# Patient Record
Sex: Male | Born: 1960 | Race: White | Hispanic: No | Smoking: Former smoker
Health system: Southern US, Community
[De-identification: ages and names within clinical notes are randomized; demographics above are authoritative.]

## PROBLEM LIST (undated history)

## (undated) DIAGNOSIS — I499 Cardiac arrhythmia, unspecified: Secondary | ICD-10-CM

## (undated) DIAGNOSIS — I459 Conduction disorder, unspecified: Secondary | ICD-10-CM

## (undated) DIAGNOSIS — E119 Type 2 diabetes mellitus without complications: Secondary | ICD-10-CM

## (undated) DIAGNOSIS — N189 Chronic kidney disease, unspecified: Secondary | ICD-10-CM

## (undated) DIAGNOSIS — I330 Acute and subacute infective endocarditis: Secondary | ICD-10-CM

## (undated) DIAGNOSIS — M1990 Unspecified osteoarthritis, unspecified site: Secondary | ICD-10-CM

## (undated) DIAGNOSIS — R918 Other nonspecific abnormal finding of lung field: Secondary | ICD-10-CM

## (undated) DIAGNOSIS — J45909 Unspecified asthma, uncomplicated: Secondary | ICD-10-CM

## (undated) DIAGNOSIS — M109 Gout, unspecified: Secondary | ICD-10-CM

## (undated) HISTORY — PX: JOINT REPLACEMENT: SHX530

## (undated) HISTORY — DX: Chronic kidney disease, unspecified: N18.9

## (undated) HISTORY — DX: Type 2 diabetes mellitus without complications: E11.9

## (undated) HISTORY — DX: Other nonspecific abnormal finding of lung field: R91.8

## (undated) HISTORY — DX: Acute and subacute infective endocarditis: I33.0

## (undated) HISTORY — PX: ACHILLES TENDON REPAIR: SUR1153

## (undated) HISTORY — PX: HERNIA REPAIR: SHX51

## (undated) HISTORY — PX: FOOT SURGERY: SHX648

## (undated) HISTORY — PX: CARDIAC VALVE REPLACEMENT: SHX585

---

## 2021-06-12 DIAGNOSIS — B9561 Methicillin susceptible Staphylococcus aureus infection as the cause of diseases classified elsewhere: Secondary | ICD-10-CM

## 2021-06-12 DIAGNOSIS — R7881 Bacteremia: Secondary | ICD-10-CM

## 2021-06-12 HISTORY — DX: Methicillin susceptible Staphylococcus aureus infection as the cause of diseases classified elsewhere: B95.61

## 2021-06-12 HISTORY — DX: Bacteremia: R78.81

## 2021-06-25 DIAGNOSIS — Z23 Encounter for immunization: Secondary | ICD-10-CM

## 2021-07-03 ENCOUNTER — Other Ambulatory Visit: Payer: Self-pay

## 2021-07-03 ENCOUNTER — Emergency Department: Payer: Self-pay

## 2021-07-03 ENCOUNTER — Inpatient Hospital Stay: Payer: Self-pay

## 2021-07-03 ENCOUNTER — Inpatient Hospital Stay
Admission: EM | Admit: 2021-07-03 | Discharge: 2021-07-08 | DRG: 871 | Disposition: A | Discharge status: Other Acute Inpatient Hospital | Payer: Self-pay | Attending: Internal Medicine | Admitting: Internal Medicine

## 2021-07-03 ENCOUNTER — Encounter: Payer: Self-pay | Admitting: Emergency Medicine

## 2021-07-03 DIAGNOSIS — D649 Anemia, unspecified: Secondary | ICD-10-CM | POA: Diagnosis present

## 2021-07-03 DIAGNOSIS — M2550 Pain in unspecified joint: Secondary | ICD-10-CM | POA: Diagnosis present

## 2021-07-03 DIAGNOSIS — Z20822 Contact with and (suspected) exposure to covid-19: Secondary | ICD-10-CM | POA: Diagnosis present

## 2021-07-03 DIAGNOSIS — R531 Weakness: Secondary | ICD-10-CM

## 2021-07-03 DIAGNOSIS — I2690 Septic pulmonary embolism without acute cor pulmonale: Secondary | ICD-10-CM | POA: Diagnosis present

## 2021-07-03 DIAGNOSIS — E1165 Type 2 diabetes mellitus with hyperglycemia: Secondary | ICD-10-CM | POA: Diagnosis present

## 2021-07-03 DIAGNOSIS — R6510 Systemic inflammatory response syndrome (SIRS) of non-infectious origin without acute organ dysfunction: Secondary | ICD-10-CM

## 2021-07-03 DIAGNOSIS — Z87891 Personal history of nicotine dependence: Secondary | ICD-10-CM

## 2021-07-03 DIAGNOSIS — M109 Gout, unspecified: Secondary | ICD-10-CM | POA: Diagnosis present

## 2021-07-03 DIAGNOSIS — Z6835 Body mass index (BMI) 35.0-35.9, adult: Secondary | ICD-10-CM

## 2021-07-03 DIAGNOSIS — E0800 Diabetes mellitus due to underlying condition with hyperosmolarity without nonketotic hyperglycemic-hyperosmolar coma (NKHHC): Secondary | ICD-10-CM

## 2021-07-03 DIAGNOSIS — R112 Nausea with vomiting, unspecified: Secondary | ICD-10-CM | POA: Diagnosis present

## 2021-07-03 DIAGNOSIS — I330 Acute and subacute infective endocarditis: Secondary | ICD-10-CM

## 2021-07-03 DIAGNOSIS — E860 Dehydration: Secondary | ICD-10-CM | POA: Diagnosis present

## 2021-07-03 DIAGNOSIS — R0602 Shortness of breath: Secondary | ICD-10-CM

## 2021-07-03 DIAGNOSIS — M609 Myositis, unspecified: Secondary | ICD-10-CM | POA: Diagnosis present

## 2021-07-03 DIAGNOSIS — R739 Hyperglycemia, unspecified: Secondary | ICD-10-CM

## 2021-07-03 DIAGNOSIS — T380X5A Adverse effect of glucocorticoids and synthetic analogues, initial encounter: Secondary | ICD-10-CM | POA: Diagnosis present

## 2021-07-03 DIAGNOSIS — G9341 Metabolic encephalopathy: Secondary | ICD-10-CM

## 2021-07-03 DIAGNOSIS — D6959 Other secondary thrombocytopenia: Secondary | ICD-10-CM | POA: Diagnosis present

## 2021-07-03 DIAGNOSIS — R7881 Bacteremia: Secondary | ICD-10-CM

## 2021-07-03 DIAGNOSIS — M25452 Effusion, left hip: Secondary | ICD-10-CM | POA: Diagnosis present

## 2021-07-03 DIAGNOSIS — I442 Atrioventricular block, complete: Secondary | ICD-10-CM | POA: Diagnosis present

## 2021-07-03 DIAGNOSIS — E669 Obesity, unspecified: Secondary | ICD-10-CM | POA: Diagnosis present

## 2021-07-03 DIAGNOSIS — Z96643 Presence of artificial hip joint, bilateral: Secondary | ICD-10-CM | POA: Diagnosis present

## 2021-07-03 DIAGNOSIS — F1010 Alcohol abuse, uncomplicated: Secondary | ICD-10-CM | POA: Diagnosis present

## 2021-07-03 DIAGNOSIS — Z794 Long term (current) use of insulin: Secondary | ICD-10-CM

## 2021-07-03 DIAGNOSIS — D696 Thrombocytopenia, unspecified: Secondary | ICD-10-CM

## 2021-07-03 DIAGNOSIS — E876 Hypokalemia: Secondary | ICD-10-CM

## 2021-07-03 DIAGNOSIS — M25451 Effusion, right hip: Secondary | ICD-10-CM | POA: Diagnosis present

## 2021-07-03 DIAGNOSIS — I459 Conduction disorder, unspecified: Secondary | ICD-10-CM

## 2021-07-03 DIAGNOSIS — E871 Hypo-osmolality and hyponatremia: Secondary | ICD-10-CM | POA: Diagnosis present

## 2021-07-03 DIAGNOSIS — J9601 Acute respiratory failure with hypoxia: Secondary | ICD-10-CM | POA: Diagnosis not present

## 2021-07-03 DIAGNOSIS — A4101 Sepsis due to Methicillin susceptible Staphylococcus aureus: Principal | ICD-10-CM | POA: Diagnosis present

## 2021-07-03 DIAGNOSIS — R197 Diarrhea, unspecified: Secondary | ICD-10-CM | POA: Diagnosis present

## 2021-07-03 HISTORY — DX: Unspecified osteoarthritis, unspecified site: M19.90

## 2021-07-03 HISTORY — DX: Gout, unspecified: M10.9

## 2021-07-03 LAB — CBG MONITORING, ED
Glucose-Capillary: 368 mg/dL — ABNORMAL HIGH (ref 70–99)
Glucose-Capillary: 283 mg/dL — ABNORMAL HIGH (ref 70–99)
Glucose-Capillary: 264 mg/dL — ABNORMAL HIGH (ref 70–99)

## 2021-07-03 LAB — BASIC METABOLIC PANEL
Anion gap: 13 (ref 5–15)
Anion gap: 12 (ref 5–15)
Anion gap: 11 (ref 5–15)
BUN: 29 mg/dL — ABNORMAL HIGH (ref 6–20)
BUN: 31 mg/dL — ABNORMAL HIGH (ref 6–20)
BUN: 32 mg/dL — ABNORMAL HIGH (ref 6–20)
CO2: 26 mmol/L (ref 22–32)
CO2: 27 mmol/L (ref 22–32)
CO2: 24 mmol/L (ref 22–32)
Calcium: 8.3 mg/dL — ABNORMAL LOW (ref 8.9–10.3)
Calcium: 8.1 mg/dL — ABNORMAL LOW (ref 8.9–10.3)
Calcium: 7.8 mg/dL — ABNORMAL LOW (ref 8.9–10.3)
Chloride: 83 mmol/L — ABNORMAL LOW (ref 98–111)
Chloride: 85 mmol/L — ABNORMAL LOW (ref 98–111)
Chloride: 87 mmol/L — ABNORMAL LOW (ref 98–111)
Creatinine, Ser: 1.13 mg/dL (ref 0.61–1.24)
Creatinine, Ser: 1.19 mg/dL (ref 0.61–1.24)
Creatinine, Ser: 0.99 mg/dL (ref 0.61–1.24)
GFR, Estimated: >60 mL/min (ref >60)
GFR, Estimated: >60 mL/min (ref >60)
GFR, Estimated: >60 mL/min (ref >60)
Glucose, Bld: 337 mg/dL — ABNORMAL HIGH (ref 70–99)
Glucose, Bld: 267 mg/dL — ABNORMAL HIGH (ref 70–99)
Glucose, Bld: 312 mg/dL — ABNORMAL HIGH (ref 70–99)
Potassium: 3.4 mmol/L — ABNORMAL LOW (ref 3.5–5.1)
Potassium: 3.4 mmol/L — ABNORMAL LOW (ref 3.5–5.1)
Potassium: 4.1 mmol/L (ref 3.5–5.1)
Sodium: 122 mmol/L — ABNORMAL LOW (ref 135–145)
Sodium: 124 mmol/L — ABNORMAL LOW (ref 135–145)
Sodium: 122 mmol/L — ABNORMAL LOW (ref 135–145)

## 2021-07-03 LAB — CBC
HCT: 39.5 % (ref 39.0–52.0)
HCT: 41.9 % (ref 39.0–52.0)
Hemoglobin: 14.8 g/dL (ref 13.0–17.0)
Hemoglobin: 15.1 g/dL (ref 13.0–17.0)
MCH: 33.2 pg (ref 26.0–34.0)
MCH: 32.8 pg (ref 26.0–34.0)
MCHC: 37.5 g/dL — ABNORMAL HIGH (ref 30.0–36.0)
MCHC: 36 g/dL (ref 30.0–36.0)
MCV: 88.6 fL (ref 80.0–100.0)
MCV: 91.1 fL (ref 80.0–100.0)
Platelets: 68 10*3/uL — ABNORMAL LOW (ref 150–400)
Platelets: 67 10*3/uL — ABNORMAL LOW (ref 150–400)
RBC: 4.46 MIL/uL (ref 4.22–5.81)
RBC: 4.6 MIL/uL (ref 4.22–5.81)
RDW: 11.8 % (ref 11.5–15.5)
RDW: 11.9 % (ref 11.5–15.5)
WBC: 17.4 10*3/uL — ABNORMAL HIGH (ref 4.0–10.5)
WBC: 13.1 10*3/uL — ABNORMAL HIGH (ref 4.0–10.5)
nRBC: 0 % (ref 0.0–0.2)
nRBC: 0 % (ref 0.0–0.2)

## 2021-07-03 LAB — URINALYSIS, COMPLETE (UACMP) WITH MICROSCOPIC
Bilirubin Urine: NEGATIVE
Glucose, UA: >=500 mg/dL — AB
Ketones, ur: NEGATIVE mg/dL
Leukocytes,Ua: NEGATIVE
Nitrite: NEGATIVE
Protein, ur: NEGATIVE mg/dL
Specific Gravity, Urine: 1.012 (ref 1.005–1.030)
pH: 5 (ref 5.0–8.0)

## 2021-07-03 LAB — GLUCOSE, CAPILLARY
Glucose-Capillary: 255 mg/dL — ABNORMAL HIGH (ref 70–99)
Glucose-Capillary: 261 mg/dL — ABNORMAL HIGH (ref 70–99)

## 2021-07-03 LAB — OSMOLALITY, URINE: Osmolality, Ur: 428 mOsm/kg (ref 300–900)

## 2021-07-03 LAB — MAGNESIUM: Magnesium: 2.2 mg/dL (ref 1.7–2.4)

## 2021-07-03 LAB — DIFFERENTIAL
Abs Immature Granulocytes: 0.19 10*3/uL — ABNORMAL HIGH (ref 0.00–0.07)
Basophils Absolute: 0.1 10*3/uL (ref 0.0–0.1)
Basophils Relative: 1 %
Eosinophils Absolute: 0 10*3/uL (ref 0.0–0.5)
Eosinophils Relative: 0 %
Immature Granulocytes: 1 %
Lymphocytes Relative: 4 %
Lymphs Abs: 0.6 10*3/uL — ABNORMAL LOW (ref 0.7–4.0)
Monocytes Absolute: 0.7 10*3/uL (ref 0.1–1.0)
Monocytes Relative: 5 %
Neutro Abs: 11.5 10*3/uL — ABNORMAL HIGH (ref 1.7–7.7)
Neutrophils Relative %: 89 %
Smear Review: DECREASED

## 2021-07-03 LAB — HIV ANTIBODY (ROUTINE TESTING W REFLEX): HIV Screen 4th Generation wRfx: NONREACTIVE

## 2021-07-03 LAB — PROTIME-INR
INR: 1.1 (ref 0.8–1.2)
Prothrombin Time: 14.3 seconds (ref 11.4–15.2)

## 2021-07-03 LAB — SODIUM, URINE, RANDOM: Sodium, Ur: <10 mmol/L

## 2021-07-03 LAB — URIC ACID: Uric Acid, Serum: 5.4 mg/dL (ref 3.7–8.6)

## 2021-07-03 LAB — SAVE SMEAR(SSMR), FOR PROVIDER SLIDE REVIEW

## 2021-07-03 LAB — RESP PANEL BY RT-PCR (FLU A&B, COVID) ARPGX2
Influenza A by PCR: NEGATIVE
Influenza B by PCR: NEGATIVE
SARS Coronavirus 2 by RT PCR: NEGATIVE

## 2021-07-03 LAB — APTT: aPTT: 25 seconds (ref 24–36)

## 2021-07-03 LAB — OSMOLALITY: Osmolality: 279 mOsm/kg (ref 275–295)

## 2021-07-03 LAB — LACTATE DEHYDROGENASE: LDH: 209 U/L — ABNORMAL HIGH (ref 98–192)

## 2021-07-03 LAB — PHOSPHORUS: Phosphorus: 3.4 mg/dL (ref 2.5–4.6)

## 2021-07-03 IMAGING — MR MR HEAD WO/W CM
14 series · 48 of 48 positions shown · IV contrast (10ml Gadavist)
Comparison: None.

CLINICAL DATA: Mental status change, unknown cause

EXAM:
MRI HEAD WITHOUT AND WITH CONTRAST
TECHNIQUE: Multiplanar, multiecho pulse sequences of the brain and surrounding
structures were obtained without and with intravenous contrast.
CONTRAST:  10mL GADAVIST GADOBUTROL 1 MMOL/ML IV SOLN

[Series 5: ax dwi_tracew · axial · 3.0mm · 0.65mm/px · z∈[-85,+56]mm · 4 of 44 slices shown]
[im 1/44]
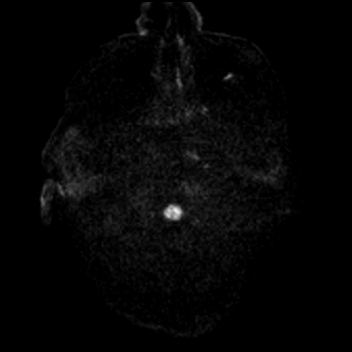
[im 15/44]
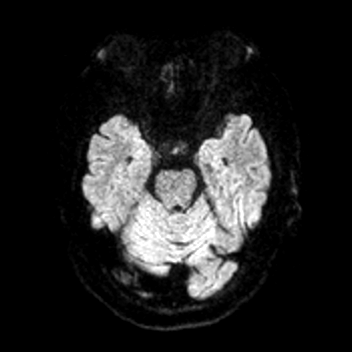
[im 29/44]
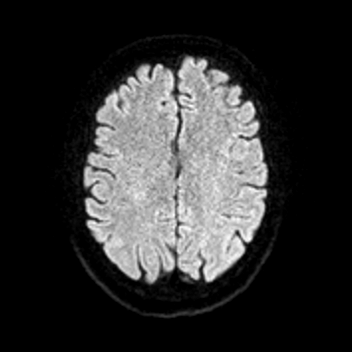
[im 44/44]
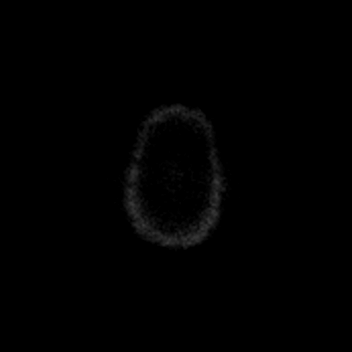

[Series 6: ax dwi_adc · axial · 3.0mm · 0.65mm/px · z∈[-85,+56]mm · 3 of 44 slices shown]
[im 1/44]
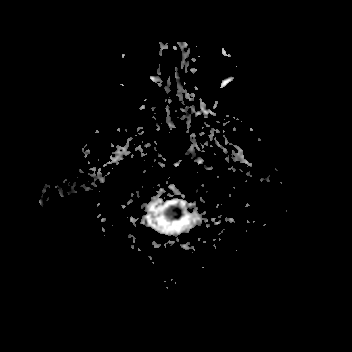
[im 22/44]
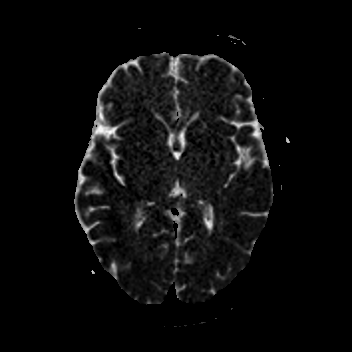
[im 44/44]
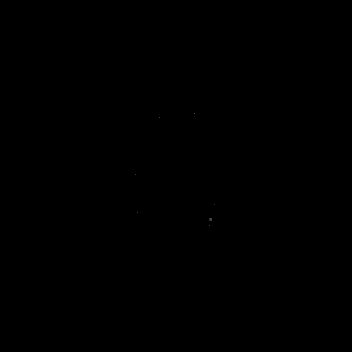

[Series 7: cor dwi_tracew · coronal · 5.0mm · 0.60mm/px · 2 of 34 slices shown]
[im 1/34]
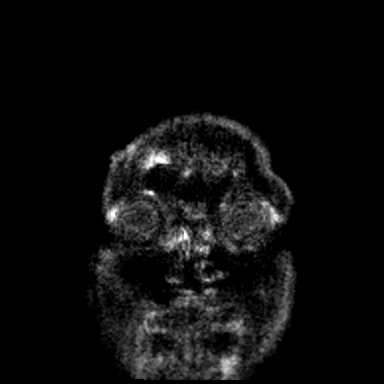
[im 34/34]
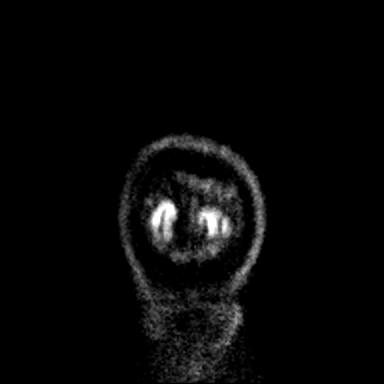

[Series 8: cor dwi_adc · coronal · 5.0mm · 0.60mm/px · 2 of 31 slices shown]
[im 1/31]
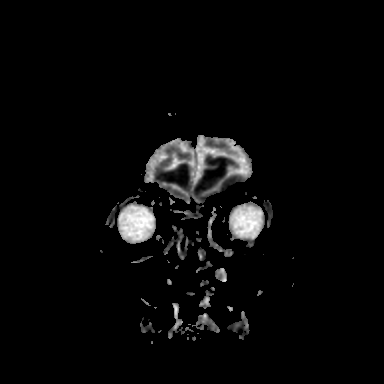
[im 31/31]
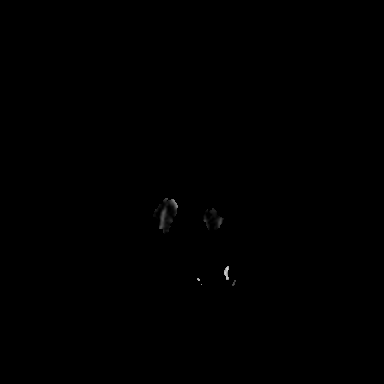

[Series 9: T1 · sagittal · 5.0mm · 0.62mm/px · 1 of 22 slices shown (1 of 2)]
[im 1/22]
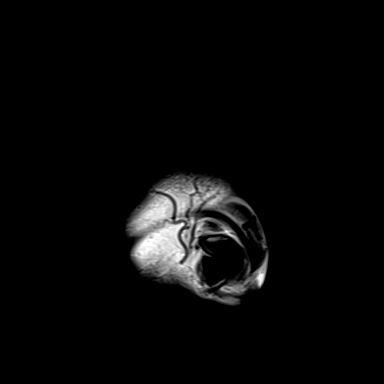

[Series 10: T2 · axial · 5.0mm · 0.53mm/px · z∈[-84,+54]mm · 2 of 24 slices shown]
[im 1/24]
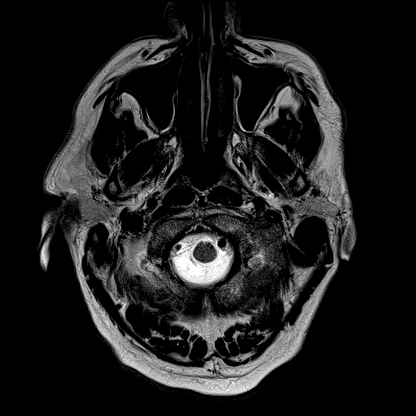
[im 24/24]
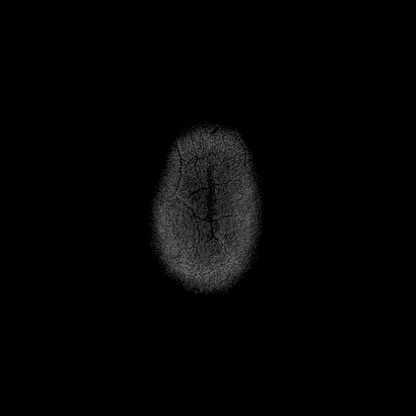

[Series 11: mag_images · axial · 3.0mm · 0.90mm/px · z∈[-91,+62]mm · 3 of 52 slices shown]
[im 1/52]
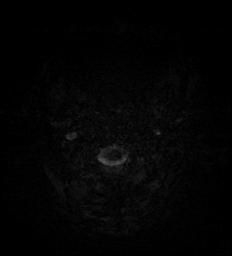
[im 26/52]
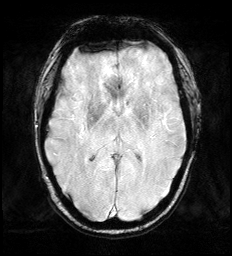
[im 52/52]
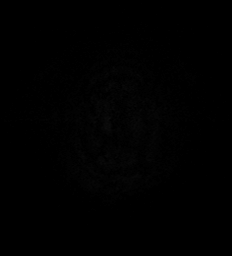

[Series 12: pha_images · axial · 3.0mm · 0.90mm/px · z∈[-91,+62]mm · 3 of 52 slices shown]
[im 1/52]
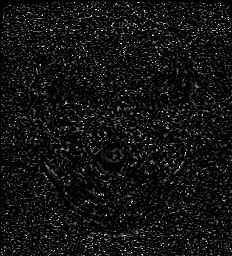
[im 26/52]
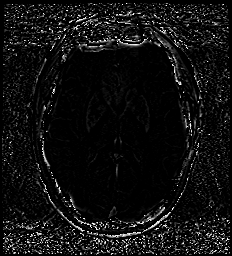
[im 52/52]
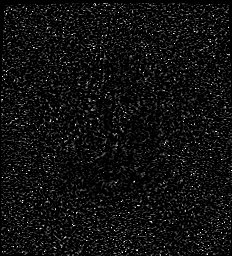

[Series 13: swi_images · axial · 3.0mm · 0.90mm/px · z∈[-91,+62]mm · 3 of 52 slices shown]
[im 1/52]
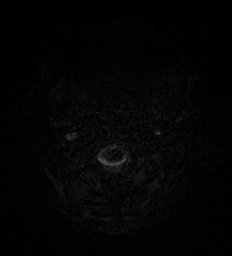
[im 26/52]
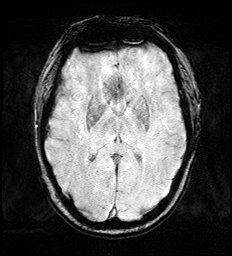
[im 52/52]
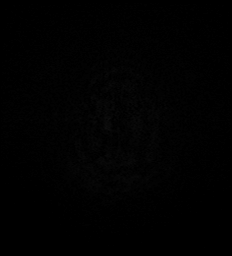

[Series 15: FLAIR · axial · 3.0mm · 0.53mm/px · z∈[-99,+70]mm · 3 of 48 slices shown]
[im 1/48]
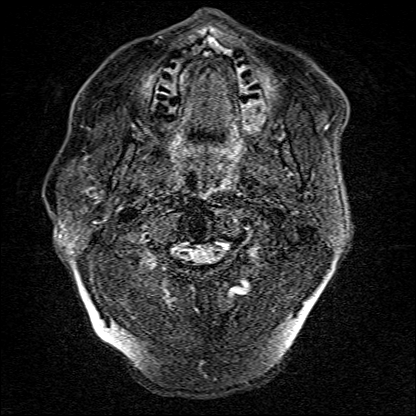
[im 24/48]
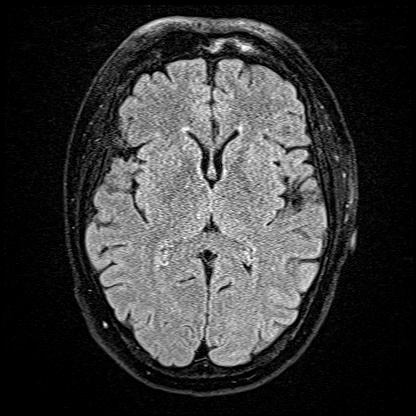
[im 48/48]
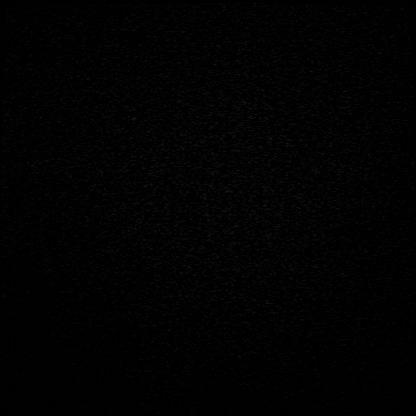

[Series 16: T1 · axial · 1.0mm · 0.98mm/px · z∈[-86,+57]mm · 9 of 144 slices shown (2 of 2)]
[im 1/144]
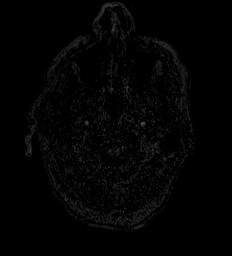
[im 18/144]
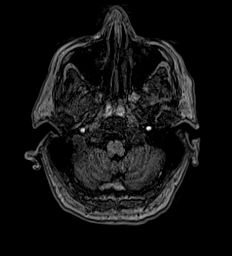
[im 36/144]
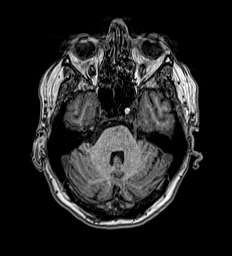
[im 54/144]
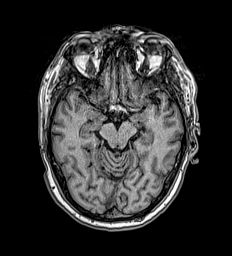
[im 72/144]
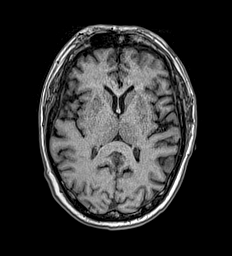
[im 90/144]
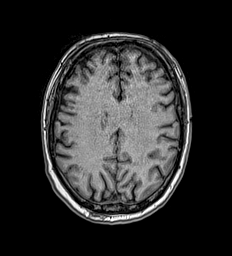
[im 108/144]
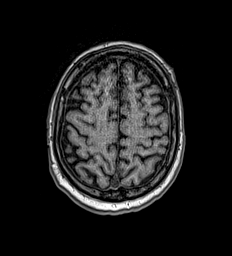
[im 126/144]
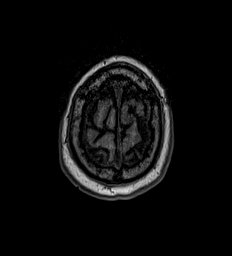
[im 144/144]
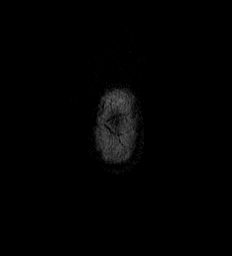

[Series 17: T2 post-contrast · coronal · 5.0mm · 0.57mm/px · 2 of 26 slices shown]
[im 1/26]
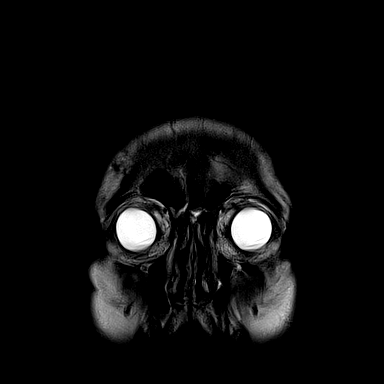
[im 26/26]
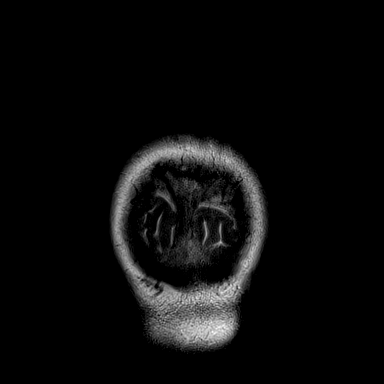

[Series 18: T1 post-contrast · axial · 1.0mm · 0.98mm/px · z∈[-86,+57]mm · 9 of 144 slices shown (1 of 2)]
[im 1/144]
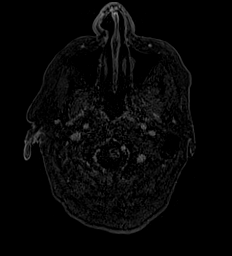
[im 18/144]
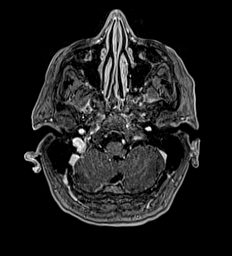
[im 36/144]
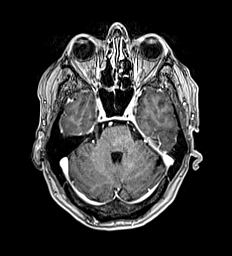
[im 54/144]
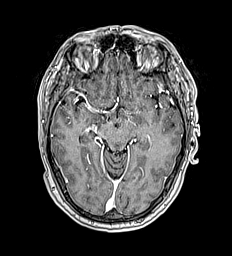
[im 72/144]
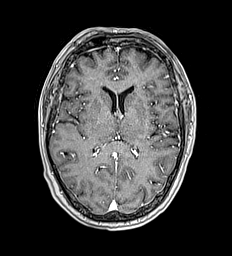
[im 90/144]
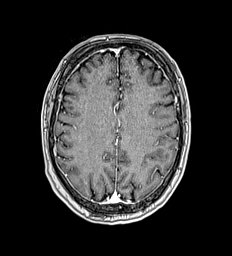
[im 108/144]
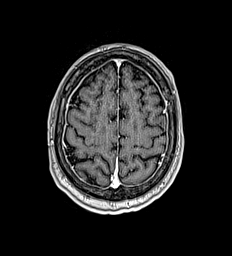
[im 126/144]
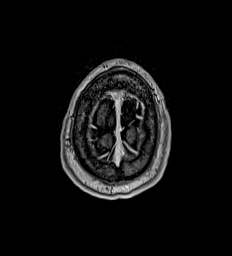
[im 144/144]
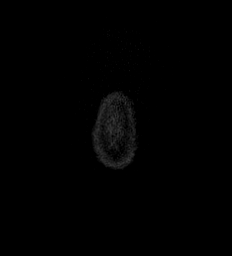

[Series 19: T1 post-contrast · coronal · 5.0mm · 0.57mm/px · 2 of 26 slices shown (2 of 2)]
[im 1/26]
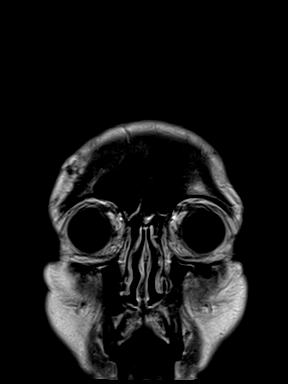
[im 26/26]
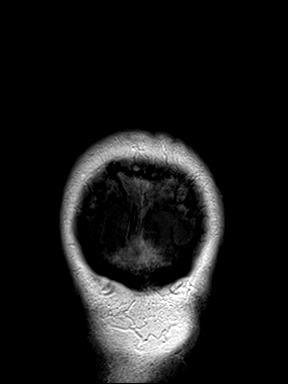

[48 of 48 positions shown; findings below may reference images not displayed]

FINDINGS: Brain: No acute infarction, hemorrhage, hydrocephalus, extra-axial
collection, or mass lesion. The ventricles and sulci are within
normal limits for age. No abnormal enhancement. No foci of
hemosiderin deposition to suggest remote hemorrhage.

Vascular: Normal flow voids.

Skull and upper cervical spine: Normal marrow signal.

Sinuses/Orbits: Small mucous retention cyst in the left maxillary
sinus. Otherwise negative.

Other: Trace fluid in right mastoid air cells.
IMPRESSION: No acute intracranial process.

## 2021-07-03 MED ORDER — INSULIN ASPART 100 UNIT/ML IJ SOLN
8.0000 [IU] | Freq: Once | INTRAMUSCULAR | Status: AC
  Administered 2021-07-03: 8 [IU] via INTRAVENOUS
  Filled 2021-07-03: qty 1

## 2021-07-03 MED ORDER — SODIUM CHLORIDE 0.9 % IV SOLN: Freq: Once | INTRAVENOUS | Status: AC

## 2021-07-03 MED ORDER — MORPHINE SULFATE (PF) 4 MG/ML IV SOLN
4.0000 mg | Freq: Once | INTRAVENOUS | Status: AC
Start: 2021-07-03 — End: 2021-07-03
  Administered 2021-07-03: 4 mg via INTRAVENOUS
  Filled 2021-07-03: qty 1

## 2021-07-03 MED ORDER — POTASSIUM CHLORIDE CRYS ER 20 MEQ PO TBCR
40.0000 meq | EXTENDED_RELEASE_TABLET | Freq: Once | ORAL | Status: AC
  Administered 2021-07-03: 40 meq via ORAL
  Filled 2021-07-03: qty 2

## 2021-07-03 MED ORDER — GADOBUTROL 1 MMOL/ML IV SOLN
10.0000 mL | Freq: Once | INTRAVENOUS | Status: AC | PRN
  Administered 2021-07-03: 10 mL via INTRAVENOUS

## 2021-07-03 MED ORDER — INSULIN ASPART 100 UNIT/ML IJ SOLN
0.0000 [IU] | Freq: Every day | INTRAMUSCULAR | Status: DC
  Administered 2021-07-03: 3 [IU] via SUBCUTANEOUS
  Administered 2021-07-04: 5 [IU] via SUBCUTANEOUS
  Administered 2021-07-05: 4 [IU] via SUBCUTANEOUS
  Administered 2021-07-06 – 2021-07-07 (×2): 3 [IU] via SUBCUTANEOUS
  Filled 2021-07-03 (×5): qty 1

## 2021-07-03 MED ORDER — ONDANSETRON HCL 4 MG/2ML IJ SOLN: 4.0000 mg | Freq: Three times a day (TID) | INTRAMUSCULAR | Status: DC | PRN

## 2021-07-03 MED ORDER — METHYLPREDNISOLONE SODIUM SUCC 40 MG IJ SOLR
40.0000 mg | Freq: Every day | INTRAMUSCULAR | Status: DC
  Administered 2021-07-03 – 2021-07-04 (×2): 40 mg via INTRAVENOUS
  Filled 2021-07-03 (×2): qty 1

## 2021-07-03 MED ORDER — SODIUM CHLORIDE 0.9 % IV BOLUS
1000.0000 mL | Freq: Once | INTRAVENOUS | Status: AC
  Administered 2021-07-03: 1000 mL via INTRAVENOUS

## 2021-07-03 MED ORDER — OXYCODONE-ACETAMINOPHEN 5-325 MG PO TABS
1.0000 | ORAL_TABLET | ORAL | Status: DC | PRN
  Administered 2021-07-03 – 2021-07-04 (×4): 1 via ORAL
  Filled 2021-07-03 (×4): qty 1

## 2021-07-03 MED ORDER — INSULIN ASPART 100 UNIT/ML IJ SOLN
0.0000 [IU] | Freq: Three times a day (TID) | INTRAMUSCULAR | Status: DC
  Administered 2021-07-03 – 2021-07-04 (×2): 5 [IU] via SUBCUTANEOUS
  Administered 2021-07-04: 9 [IU] via SUBCUTANEOUS
  Filled 2021-07-03 (×3): qty 1

## 2021-07-03 MED ORDER — ACETAMINOPHEN 325 MG PO TABS
650.0000 mg | ORAL_TABLET | Freq: Four times a day (QID) | ORAL | Status: DC | PRN
  Administered 2021-07-05 – 2021-07-08 (×3): 650 mg via ORAL
  Filled 2021-07-03 (×3): qty 2

## 2021-07-03 MED ORDER — SODIUM CHLORIDE 0.9 % IV SOLN: INTRAVENOUS | Status: DC

## 2021-07-03 NOTE — ED Provider Notes (Signed)
[facility] Emergency Department Provider Note  Time seen: 12:02 PM  I have reviewed the triage vital signs and the nursing notes.   HISTORY  Chief Complaint Weakness   HPI Joshua Cole is a 60 y.o. male with a past medical history of arthritis, gout, presents to the emergency department for generalized fatigue nausea intermittent fever intermittent confusion.  According to the wife patient received his flu vaccination this past Thursday (9 days ago), beginning Saturday night (1 week ago) patient began experiencing slight cough fever as high as 102 nausea vomiting generalized weakness increased pain in his joints where he has arthritis.  States symptoms have been ongoing over this entire past week he was seen at urgent care had a negative flu and COVID test but was started on Tamiflu and steroids (Medrol Dosepak) as a precaution.  Wife states the symptoms of weakness and nausea seem to be improving somewhat but he remains quite confused at times.  Last fever was 2 days ago per wife.  Patient does not take any chronic medications.   Past Medical History:  Diagnosis Date   Arthritis    DJD (degenerative joint disease)    Gout     There are no problems to display for this patient.   Past Surgical History:  Procedure Laterality Date   JOINT REPLACEMENT      Prior to Admission medications   Not on File    No Known Allergies  No family history on file.  Social History Social History   Tobacco Use   Smoking status: Never   Smokeless tobacco: Never  Substance Use Topics   Alcohol use: Yes    Comment: bilateral hip replacement   Drug use: Never    Review of Systems Constitutional: Intermittent fever x1 week as high as 102 per wife.  Intermittent confusion x1 week. Cardiovascular: Negative for chest pain. Respiratory: States some shortness of breath at times with occasional dry cough. Gastrointestinal: Negative for abdominal pain.  Intermittent  nausea and vomiting.  Decreased appetite. Genitourinary: Negative for urinary compaints Musculoskeletal: Negative for musculoskeletal complaints Skin: Negative for skin complaints  Neurological: No headache. All other ROS negative  ____________________________________________   PHYSICAL EXAM:  VITAL SIGNS: ED Triage Vitals  Enc Vitals Group     BP 07/03/21 0957 98/61     Pulse Rate 07/03/21 0954 (!) 110     Resp 07/03/21 0954 16     Temp 07/03/21 0954 97.8 F (36.6 C)     Temp Source 07/03/21 0954 Oral     SpO2 07/03/21 0954 95 %     Weight 07/03/21 0954 232 lb (105.2 kg)     Height 07/03/21 0954 5\' 10"  (1.778 m)     Head Circumference --      Peak Flow --      Pain Score 07/03/21 0954 8     Pain Loc --      Pain Edu? --      Excl. in [place]? --    Constitutional: Patient is awake alert some confusion for instance called his wife by his daughter's name (per wife).  Patient following commands well. Eyes: Normal exam ENT      Head: Normocephalic and atraumatic.      Mouth/Throat: Mucous membranes are moist. Cardiovascular: Normal rate, regular rhythm.  Respiratory: Normal respiratory effort without tachypnea nor retractions. Breath sounds are clear Gastrointestinal: Soft and nontender. No distention.  Musculoskeletal: Nontender with normal range of motion in all extremities.  Neurologic:  Normal speech and language. No gross focal neurologic deficits  Skin:  Skin is warm, dry and intact.  Psychiatric: Mood and affect are normal.   ____________________________________________    EKG  EKG viewed and interpreted by myself shows sinus tachycardia 109 bpm with a narrow QRS, normal axis, PR prolongation otherwise normal intervals, nonspecific ST changes.  ____________________________________________    RADIOLOGY  Chest x-ray negative  ____________________________________________   INITIAL IMPRESSION / ASSESSMENT AND PLAN / ED COURSE  Pertinent labs & imaging results  that were available during my care of the patient were reviewed by me and considered in my medical decision making (see chart for details).   Patient presents emergency department for generalized fatigue weakness intermittent fever nausea and joint pains.  Patient's lab work shows moderate leukocytosis which could be explained by the patient's current Medrol dose pack.  Patient glucose is elevated to 300 with no history of diabetes, could be new onset diabetes, however steroids can also cause hyperglycemia.  Patient is sodium of 122 even when corrected for sodium remains significantly low which could account for the majority of his symptoms.  We will dose IV insulin, IV fluids, start the patient on normal saline infusion.  We will also check a urinalysis chest x-ray COVID/flu testing continue to closely monitor.  Ultimately patient will require admission given his hyponatremia with intermittent weakness/confusion.  Chest x-ray negative.  COVID/flu negative.  Given the patient's hyponatremia weakness with intermittent confusion we will admit to the hospital service.  Patient given 8 units of IV insulin, recheck blood sugar 264.  We will continue to IV hydrate, placed on normal saline infusion and admit to the hospitalist service.  Ashley Montminy was evaluated in Emergency Department on 07/03/2021 for the symptoms described in the history of present illness. He was evaluated in the context of the global COVID-19 pandemic, which necessitated consideration that the patient might be at risk for infection with the SARS-CoV-2 virus that causes COVID-19. Institutional protocols and algorithms that pertain to the evaluation of patients at risk for COVID-19 are in a state of rapid change based on information released by regulatory bodies including the CDC and federal and state organizations. These policies and algorithms were followed during the patient's care in the  ED.  ____________________________________________   FINAL CLINICAL IMPRESSION(S) / ED DIAGNOSES  Hyponatremia Confusion   Harvest Dark, MD 07/03/21 1457

## 2021-07-03 NOTE — H&P (Addendum)
History and Physical    Joshua Cole SNK:[id number] DOB: May 29, 1961 DOA: 07/03/2021  Referring MD/NP/PA:   PCP: Pcp, No   Patient coming from:  The patient is coming from home.  At baseline, pt is independent for most of ADL.        Chief Complaint: Generalized weakness, multiple joints pain, confusion, nausea, vomiting, diarrhea, fever  HPI: Joshua Cole is a 60 y.o. male with medical history significant of gout, alcohol use, who presents with generalized weakness, multiple joints pain, confusion, nausea, vomiting, diarrhea, fever.  Per his fiance (I called her fianc by phone), patient received flu shot and COVID-vaccine booster on Friday.  In Saturday night, patient had fever of 102 and chills.  He complains of multiple joint pain, including right hand, left shoulder, bilateral hip and lower back.  He has nausea, vomiting and few times of watery diarrhea, which has resolved per patient.  Patient has mild dry cough, mild shortness of breath, denies chest pain. Patient was found to be intermittently confused.  When saw patient in the ED, patient is mildly confused, but is still oriented x3.  He moves all extremities.  No facial droop or slurred speech.  Denies symptoms of UTI.  Her fianc states that patient was seen in urgent care and was tested negative for flu and COVID, but started him on Tamiflu and steroid on 10/20.  ED Course: pt was found to have WBC 17.4, platelet 68, negative COVID PCR, negative urinalysis, sodium 122, potassium 3.4, GFR > 60, blood sugar 337 (no history of diabetes), temperature normal, blood pressure 102/58, heart rate 110, RR 33, oxygen saturation 96% on room air.  Chest x-ray negative.  Pending CT of head.  Patient is admitted to [facility] as inpatient.  Review of Systems:   General: has fevers, chills, no body weight gain, has poor appetite, has fatigue HEENT: no blurry vision, hearing changes or sore throat Respiratory: has dyspnea, coughing, no  wheezing CV: no chest pain, no palpitations GI: has nausea, vomiting, diarrhea, constipation, no abdominal pain, GU: no dysuria, burning on urination, increased urinary frequency, hematuria  Ext: no leg edema Neuro: no unilateral weakness, numbness, or tingling, no vision change or hearing loss. Has confusion Skin: no rash, no skin tear. MSK: No muscle spasm, no deformity, no limitation of range of movement in spin Heme: No easy bruising.  Travel history: No recent long distant travel.  Allergy: No Known Allergies  Past Medical History:  Diagnosis Date   Arthritis    DJD (degenerative joint disease)    Gout     Past Surgical History:  Procedure Laterality Date   JOINT REPLACEMENT      Social History:  reports that he has never smoked. He has never used smokeless tobacco. He reports current alcohol use. He reports that he does not use drugs.  Family History:  Family History  Problem Relation Age of Onset   Psoriasis Sister      Prior to Admission medications   Not on File    Physical Exam: Vitals:   07/03/21 1426 07/03/21 1430 07/03/21 1548 07/03/21 1641  BP: 108/68 106/62 120/64 (!) 127/59  Pulse: 87 79 82 87  Resp: (!) 33 (!) 32 (!) 27 (!) 30  Temp: (!) 97.4 F (36.3 C)   99.2 F (37.3 C)  TempSrc: Oral   Oral  SpO2: 96% 96% 94% 92%  Weight:      Height:       General: Not in acute  distress HEENT:       Eyes: PERRL, EOMI, no scleral icterus.       ENT: No discharge from the ears and nose, no pharynx injection, no tonsillar enlargement.        Neck: No JVD, no bruit, no mass felt. Heme: No neck lymph node enlargement. Cardiac: S1/S2, RRR, No murmurs, No gallops or rubs. Respiratory: No rales, wheezing, rhonchi or rubs. GI: Soft, nondistended, nontender, no rebound pain, no organomegaly, BS present. GU: No hematuria Ext: No pitting leg edema bilaterally. 1+DP/PT pulse bilaterally. Musculoskeletal: No joint deformities, no limitation of ROM in spin.  Has  multiple joints pain Skin: No rashes.  Neuro: Mildly confused, but still oriented X3, cranial nerves II-XII grossly intact, moves all extremities normally.  Knee reflex 2+ bilaterally Psych: Patient is not psychotic, no suicidal or hemocidal ideation.  Labs on Admission: I have personally reviewed following labs and imaging studies  CBC: Recent Labs  Lab 07/03/21 0959  WBC 17.4*  HGB 14.8  HCT 39.5  MCV 88.6  PLT 68*   Basic Metabolic Panel: Recent Labs  Lab 07/03/21 0959  NA 122*  K 3.4*  CL 83*  CO2 26  GLUCOSE 337*  BUN 29*  CREATININE 1.13  CALCIUM 8.3*   GFR: Estimated Creatinine Clearance: 89.7 mL/min (by C-G formula based on SCr of 1.13 mg/dL). Liver Function Tests: No results for input(s): AST, ALT, ALKPHOS, BILITOT, PROT, ALBUMIN in the last 168 hours. No results for input(s): LIPASE, AMYLASE in the last 168 hours. No results for input(s): AMMONIA in the last 168 hours. Coagulation Profile: No results for input(s): INR, PROTIME in the last 168 hours. Cardiac Enzymes: No results for input(s): CKTOTAL, CKMB, CKMBINDEX, TROPONINI in the last 168 hours. BNP (last 3 results) No results for input(s): PROBNP in the last 8760 hours. HbA1C: No results for input(s): HGBA1C in the last 72 hours. CBG: Recent Labs  Lab 07/03/21 1148 07/03/21 1301 07/03/21 1404 07/03/21 1648  GLUCAP 368* 283* 264* 255*   Lipid Profile: No results for input(s): CHOL, HDL, LDLCALC, TRIG, CHOLHDL, LDLDIRECT in the last 72 hours. Thyroid Function Tests: No results for input(s): TSH, T4TOTAL, FREET4, T3FREE, THYROIDAB in the last 72 hours. Anemia Panel: No results for input(s): VITAMINB12, FOLATE, FERRITIN, TIBC, IRON, RETICCTPCT in the last 72 hours. Urine analysis:    Component Value Date/Time   COLORURINE YELLOW (A) 07/03/2021 1509   APPEARANCEUR CLEAR (A) 07/03/2021 1509   LABSPEC 1.012 07/03/2021 1509   PHURINE 5.0 07/03/2021 1509   GLUCOSEU >=500 (A) 07/03/2021 1509    HGBUR SMALL (A) 07/03/2021 1509   BILIRUBINUR NEGATIVE 07/03/2021 1509   KETONESUR NEGATIVE 07/03/2021 1509   PROTEINUR NEGATIVE 07/03/2021 1509   NITRITE NEGATIVE 07/03/2021 1509   LEUKOCYTESUR NEGATIVE 07/03/2021 1509   Sepsis Labs: @LABRCNTIP (procalcitonin:4,lacticidven:4) ) Recent Results (from the past 240 hour(s))  Resp Panel by RT-PCR (Flu A&B, Covid) Nasopharyngeal Swab     Status: None   Collection Time: 07/03/21 12:08 PM   Specimen: Nasopharyngeal Swab; Nasopharyngeal(NP) swabs in vial transport medium  Result Value Ref Range Status   SARS Coronavirus 2 by RT PCR NEGATIVE NEGATIVE Final    Comment: (NOTE) SARS-CoV-2 target nucleic acids are NOT DETECTED.  The SARS-CoV-2 RNA is generally detectable in upper respiratory specimens during the acute phase of infection. The lowest concentration of SARS-CoV-2 viral copies this assay can detect is 138 copies/mL. A negative result does not preclude SARS-Cov-2 infection and should not be used as the sole basis  for treatment or other patient management decisions. A negative result may occur with  improper specimen collection/handling, submission of specimen other than nasopharyngeal swab, presence of viral mutation(s) within the areas targeted by this assay, and inadequate number of viral copies(<138 copies/mL). A negative result must be combined with clinical observations, patient history, and epidemiological information. The expected result is Negative.  Fact Sheet for Patients:  EntrepreneurPulse.com.au  Fact Sheet for Healthcare Providers:  IncredibleEmployment.be  This test is no t yet approved or cleared by the [place] FDA and  has been authorized for detection and/or diagnosis of SARS-CoV-2 by FDA under an Emergency Use Authorization (EUA). This EUA will remain  in effect (meaning this test can be used) for the duration of the COVID-19 declaration under Section 564(b)(1) of the  Act, 21 U.S.C.section 360bbb-3(b)(1), unless the authorization is terminated  or revoked sooner.       Influenza A by PCR NEGATIVE NEGATIVE Final   Influenza B by PCR NEGATIVE NEGATIVE Final    Comment: (NOTE) The Xpert Xpress SARS-CoV-2/FLU/RSV plus assay is intended as an aid in the diagnosis of influenza from Nasopharyngeal swab specimens and should not be used as a sole basis for treatment. Nasal washings and aspirates are unacceptable for Xpert Xpress SARS-CoV-2/FLU/RSV testing.  Fact Sheet for Patients: EntrepreneurPulse.com.au  Fact Sheet for Healthcare Providers: IncredibleEmployment.be  This test is not yet approved or cleared by the [place] FDA and has been authorized for detection and/or diagnosis of SARS-CoV-2 by FDA under an Emergency Use Authorization (EUA). This EUA will remain in effect (meaning this test can be used) for the duration of the COVID-19 declaration under Section 564(b)(1) of the Act, 21 U.S.C. section 360bbb-3(b)(1), unless the authorization is terminated or revoked.  Performed at [facility], [address] [place],  [address]      Radiological Exams on Admission: DG Chest Portable 1 View  Result Date: 07/03/2021 CLINICAL DATA:  sob/cough EXAM: PORTABLE CHEST 1 VIEW COMPARISON:  None. FINDINGS: The cardiomediastinal silhouette is mildly enlarged in contour. No pleural effusion. No pneumothorax. No acute pleuroparenchymal abnormality. Visualized abdomen is unremarkable. IMPRESSION: No acute cardiopulmonary abnormality. Electronically Signed   By: Valentino Saxon M.D.   On: 07/03/2021 12:28     EKG: I have personally reviewed.  Sinus rhythm, QTC 414, first-degree AV block, nonspecific T wave change  Assessment/Plan Principal Problem:   Hyponatremia Active Problems:   Acute metabolic encephalopathy   Joint pain   Generalized weakness   SIRS (systemic inflammatory response  syndrome) (HCC)   Hypokalemia   Hyperglycemia   Thrombocytopenia (HCC)   Nausea vomiting and diarrhea   Gout   Hyponatremia: Na 122.  Likely due to alcohol abuse, poor oral intake and dehydration -Admitted to [facility] for observation -Will check urine sodium, urine osmolality, serum osmolality. - Fluid restriction - IVF: 1L NS in ED, will continue with IV normal saline at 75 mL/h - f/u by BMP q8h - avoid over correction too fast due to risk of central pontine myelinolysis  Acute metabolic encephalopathy: Etiology is not clear.  Patient has thrombocytopenia with platelets 68, TTP is potential differential diagnosis.  Dr. [name] of oncology is consulted, he reviewed peripheral smear, no evidence of TTP.  Dr. [name] recommended to get MRI of the brain with and without contrast to rule out intracranial thrombosis formation since patient got COVID vaccine recently.  Initially ordered CT scan, which will be changed to MRI for brain.  -Frequent neuro check -Follow-up MRI  for brain with and without contrast  Joint pain and hx of gout: Patient has multiple joints pain, may be due to gout flareup -Check uric acid level -Start Solu-Medrol 40 mg daily -As needed Percocet and Tylenol for pain  Generalized weakness: Likely multifactorial etiology.  Guillain-Barr syndrome is a differential diagnosis, but patient does not have ascending paralysis, he has 2+ knee reflex, low suspicions for Guillain-Barr. -PT/OT  SIRS (systemic inflammatory response syndrome) [facility]): Patient meets criteria for SIRS with WBC 17.4, tachycardia with heart rate of 110.  But no fever.  Pending lactic acid level.  His leukocytosis is likely due to steroid use.  Since patient does not have fever currently, no source of infection identified.  His recent fever may be due to COVID-vaccine -IV fluid: 1 L normal saline, 75 cc/h that -will get Procalcitonin and trend lactic acid levels -f/u Bx and Ux -hold off ABx  now  Addendum- sepsis: Blood culture came back positive with Staph aureus, no resistance.  Pharmacy has been consulted for Cefazolin dosing. Now pt meets criteria for sepsis.  Will need to be treated as sepsis.  Hypokalemia: K 3.4.  Magnesium 2.2.  Phosphorus 3.4 -Repleted potassium  Hyperglycemia: Blood sugar 337.  Likely due to recent steroid use -Sliding scale insulin -Check A1c  Thrombocytopenia ([place]): Platelets 68.  No active bleeding.  May be due to alcohol abuse.  TTP is a potential differential diagnosis, but per Dr. [name] who reviewed peripheral smear, no evidence of TTP -Follow-up CBC  Nausea vomiting and diarrhea: Patient's symptoms has resolved. -IV fluid as above -If patient develops diarrhea again, will check C. difficile   DVT ppx: SCD Code Status: Full code Family Communication: Yes, patient's fianc performed Disposition Plan:  Anticipate discharge back to previous environment Consults called: Dr. [name] of oncology Admission status and Level of care: Med-Surg:     as inpt         Status is: Inpatient  Remains inpatient appropriate because: Presents with multiple acute issues as listed above, his presentation is highly complicated.  Patient is at high risk of deteriorating.  Need to be treated in hospital for at least 2 days.          Date of Service 07/03/2021    Ivor Costa [facility]   If 7PM-7AM, please contact night-coverage [email or web address] 07/03/2021, 5:15 PM

## 2021-07-03 NOTE — ED Triage Notes (Signed)
C/O weakness, arthritis joint pain x 1 week.  Wife reports patient has had some vomiting this week as well.  Had COVID and FLU shots last week.  Patient states difficulty walking due to joint pain.

## 2021-07-03 NOTE — ED Notes (Signed)
Patient got flu and COVID immunizations 1 week ago Friday. States he started feeling bad. Was seen at an Urgent Care Thursday and started on Tamiflu and steroids. States his back pain is so bad he cant walk and all his joints hurt. Also c/o dyspnea.

## 2021-07-03 NOTE — Progress Notes (Signed)
   07/03/21 1641  Assess: MEWS Score  Temp 99.2 F (37.3 C)  BP (!) 127/59  Pulse Rate 87  Resp (!) 30  Level of Consciousness Alert  SpO2 92 %  O2 Device Room Air  Assess: MEWS Score  MEWS Temp 0  MEWS Systolic 0  MEWS Pulse 0  MEWS RR 2  MEWS LOC 0  MEWS Score 2  MEWS Score Color Yellow  Assess: if the MEWS score is Yellow or Red  Were vital signs taken at a resting state? Yes  Focused Assessment No change from prior assessment  Does the patient meet 2 or more of the SIRS criteria? No  Does the patient have a confirmed or suspected source of infection? No  MEWS guidelines implemented *See Row Information* Yes  Treat  MEWS Interventions Administered scheduled meds/treatments;Other (Comment) (continue to monitor VS)  Pain Scale 0-10  Pain Score 8  Pain Type Acute pain  Pain Location Back  Pain Descriptors / Indicators Aching  Pain Frequency Constant  Pain Onset Gradual  Patients Stated Pain Goal 3  Pain Intervention(s) Medication (See eMAR)  Multiple Pain Sites Yes  2nd Pain Site  Pain Score 8  Pain Type Acute pain  Pain Location Hip  Pain Orientation Left;Right  Pain Descriptors / Indicators Aching  Pain Frequency Constant  Pain Onset On-going  Patient's Stated Pain Goal 4  Pain Intervention(s) Medication (See eMAR)  Take Vital Signs  Increase Vital Sign Frequency  Yellow: Q 2hr X 2 then Q 4hr X 2, if remains yellow, continue Q 4hrs  Escalate  MEWS: Escalate Yellow: discuss with charge nurse/RN and consider discussing with provider and RRT  Notify: Charge Nurse/RN  Name of Charge Nurse/RN Notified Claiborne Billings Isenhour RN  Date Charge Nurse/RN Notified 07/03/21  Time Charge Nurse/RN Notified 1655  Notify: Provider  Provider Name/Title MD Blaine Hamper  Date Provider Notified 07/03/21  Time Provider Notified 1700  Notification Type Page  Notification Reason Other (Comment)  Provider response No new orders  Date of Provider Response 07/03/21  Time of Provider Response  1705  Document  Patient Outcome Other (Comment) (resp improved. temp down)  Assess: SIRS CRITERIA  SIRS Temperature  0  SIRS Pulse 0  SIRS Respirations  1  SIRS WBC 0  SIRS Score Sum  1

## 2021-07-04 ENCOUNTER — Inpatient Hospital Stay (HOSPITAL_COMMUNITY)
Admit: 2021-07-04 | Discharge: 2021-07-04 | Disposition: A | Discharge status: Home / Self Care | Payer: Self-pay | Attending: Internal Medicine | Admitting: Internal Medicine

## 2021-07-04 DIAGNOSIS — E871 Hypo-osmolality and hyponatremia: Secondary | ICD-10-CM

## 2021-07-04 DIAGNOSIS — R7881 Bacteremia: Secondary | ICD-10-CM

## 2021-07-04 LAB — PROTIME-INR
INR: 1.2 (ref 0.8–1.2)
Prothrombin Time: 14.9 seconds (ref 11.4–15.2)

## 2021-07-04 LAB — BLOOD CULTURE ID PANEL (REFLEXED) - BCID2

## 2021-07-04 LAB — CBC
HCT: 38.7 % — ABNORMAL LOW (ref 39.0–52.0)
Hemoglobin: 14.3 g/dL (ref 13.0–17.0)
MCH: 34.8 pg — ABNORMAL HIGH (ref 26.0–34.0)
MCHC: 37 g/dL — ABNORMAL HIGH (ref 30.0–36.0)
MCV: 94.2 fL (ref 80.0–100.0)
Platelets: 59 10*3/uL — ABNORMAL LOW (ref 150–400)
RBC: 4.11 MIL/uL — ABNORMAL LOW (ref 4.22–5.81)
RDW: 12 % (ref 11.5–15.5)
WBC: 16.2 10*3/uL — ABNORMAL HIGH (ref 4.0–10.5)
nRBC: 0 % (ref 0.0–0.2)

## 2021-07-04 LAB — BASIC METABOLIC PANEL
Anion gap: 7 (ref 5–15)
BUN: 29 mg/dL — ABNORMAL HIGH (ref 6–20)
CO2: 27 mmol/L (ref 22–32)
Calcium: 7.8 mg/dL — ABNORMAL LOW (ref 8.9–10.3)
Chloride: 94 mmol/L — ABNORMAL LOW (ref 98–111)
Creatinine, Ser: 0.91 mg/dL (ref 0.61–1.24)
GFR, Estimated: >60 mL/min (ref >60)
Glucose, Bld: 281 mg/dL — ABNORMAL HIGH (ref 70–99)
Potassium: 4.1 mmol/L (ref 3.5–5.1)
Sodium: 128 mmol/L — ABNORMAL LOW (ref 135–145)

## 2021-07-04 LAB — LACTATE DEHYDROGENASE: LDH: 218 U/L — ABNORMAL HIGH (ref 98–192)

## 2021-07-04 LAB — GLUCOSE, CAPILLARY
Glucose-Capillary: 381 mg/dL — ABNORMAL HIGH (ref 70–99)
Glucose-Capillary: 481 mg/dL — ABNORMAL HIGH (ref 70–99)
Glucose-Capillary: 387 mg/dL — ABNORMAL HIGH (ref 70–99)

## 2021-07-04 LAB — APTT: aPTT: 22 seconds — ABNORMAL LOW (ref 24–36)

## 2021-07-04 LAB — D-DIMER, QUANTITATIVE: D-Dimer, Quant: 3.78 ug/mL-FEU — ABNORMAL HIGH (ref 0.00–0.50)

## 2021-07-04 MED ORDER — INSULIN ASPART 100 UNIT/ML IJ SOLN
0.0000 [IU] | Freq: Three times a day (TID) | INTRAMUSCULAR | Status: DC
  Administered 2021-07-04: 20 [IU] via SUBCUTANEOUS
  Administered 2021-07-05: 7 [IU] via SUBCUTANEOUS
  Administered 2021-07-05: 4 [IU] via SUBCUTANEOUS
  Administered 2021-07-05: 11 [IU] via SUBCUTANEOUS
  Administered 2021-07-06: 7 [IU] via SUBCUTANEOUS
  Administered 2021-07-06: 4 [IU] via SUBCUTANEOUS
  Administered 2021-07-06: 7 [IU] via SUBCUTANEOUS
  Administered 2021-07-07: 4 [IU] via SUBCUTANEOUS
  Administered 2021-07-07: 7 [IU] via SUBCUTANEOUS
  Administered 2021-07-08 (×2): 11 [IU] via SUBCUTANEOUS
  Filled 2021-07-04 (×10): qty 1

## 2021-07-04 MED ORDER — INSULIN GLARGINE-YFGN 100 UNIT/ML ~~LOC~~ SOLN
10.0000 [IU] | Freq: Every day | SUBCUTANEOUS | Status: DC
  Administered 2021-07-04: 10 [IU] via SUBCUTANEOUS
  Filled 2021-07-04 (×3): qty 0.1

## 2021-07-04 MED ORDER — KETOROLAC TROMETHAMINE 15 MG/ML IJ SOLN
15.0000 mg | Freq: Four times a day (QID) | INTRAMUSCULAR | Status: DC
  Administered 2021-07-04 – 2021-07-06 (×8): 15 mg via INTRAVENOUS
  Filled 2021-07-04 (×8): qty 1

## 2021-07-04 MED ORDER — CEFAZOLIN SODIUM-DEXTROSE 2-4 GM/100ML-% IV SOLN
2.0000 g | Freq: Three times a day (TID) | INTRAVENOUS | Status: DC
  Administered 2021-07-04 – 2021-07-08 (×14): 2 g via INTRAVENOUS
  Filled 2021-07-04 (×18): qty 100

## 2021-07-04 MED ORDER — OXYCODONE HCL 5 MG PO TABS
5.0000 mg | ORAL_TABLET | ORAL | Status: DC | PRN
  Administered 2021-07-04: 5 mg via ORAL
  Filled 2021-07-04: qty 1

## 2021-07-04 MED ORDER — MORPHINE SULFATE (PF) 2 MG/ML IV SOLN
2.0000 mg | INTRAVENOUS | Status: DC | PRN
Start: 2021-07-04 — End: 2021-07-08
  Administered 2021-07-04 – 2021-07-06 (×3): 2 mg via INTRAVENOUS
  Filled 2021-07-04 (×3): qty 1

## 2021-07-04 MED ORDER — INSULIN ASPART 100 UNIT/ML IJ SOLN
5.0000 [IU] | Freq: Once | INTRAMUSCULAR | Status: AC
  Administered 2021-07-04: 5 [IU] via SUBCUTANEOUS
  Filled 2021-07-04: qty 1

## 2021-07-04 NOTE — Progress Notes (Signed)
Pharmacy Antibiotic Note  Joshua Cole is a 60 y.o. male admitted on 07/03/2021 with bacteremia - Staph aureus, no resistance.  Pharmacy has been consulted for Cefazolin dosing.  Plan: Cefazolin 2 gm q8h per indication and renal fxn.  Pharmacy will continue to follow and adjust abx dosing if warranted.  Height: 5\' 10"  (177.8 cm) Weight: 113.1 kg (249 lb 4.8 oz) IBW/kg (Calculated) : 73  Temp (24hrs), Avg:98.1 F (36.7 C), Min:97.4 F (36.3 C), Max:99.2 F (37.3 C)  Recent Labs  Lab 07/03/21 0959 07/03/21 1655 07/03/21 2301  WBC 17.4* 13.1*  --   CREATININE 1.13 1.19 0.99    Estimated Creatinine Clearance: 102.4 mL/min (by C-G formula based on SCr of 0.99 mg/dL).    No Known Allergies  Antimicrobials this admission: 10/23 Cefazolin >>   Microbiology results: 10/22 BCx: 4 of 4 Staph Aureus, no resistance 10/22 UCx: Pending   Thank you for allowing pharmacy to be a part of this patient's care.  Renda Rolls, PharmD, [facility] 07/04/2021 5:08 AM

## 2021-07-04 NOTE — Evaluation (Signed)
Physical Therapy Evaluation Patient Details Name: Joshua Cole MRN: [id number] DOB: 1961/03/28 Today's Date: 07/04/2021  History of Present Illness  Pt is a 60 y/o M admitted on 07/03/21 with c/c of weakness, joint pain, confusion, N&V, diarrhea & fever. Pt received flu shot & covid booster on Friday. On Saturday night pt developed fever & chills. Pt was seen in Urgent care on 10/20 & tested negative for flu & covid but was started on tamiflu & steroid. Pt is currently being treated for hyponatremia & acute metabolic encephalopathy of unclear etiology. Brain MRI was ordered but was negative for acute processes. Blood cultures positive for Staph aureus. PMH: gout, alcohol use  Clinical Impression  Pt seen for PT evaluation with pt reporting he was independent without AD, working driving a bus prior to admission. On this date pt endorses joint pain 2/2 gout but states he's premedicated. Pt is able to complete supine>sit with supervision & HOB slightly elevated. Pt does require min assist with educational cuing re: hand placement for sit>stand with RW from [place] & pt is able to take steps to R to transfer to recliner. Pt notes this minimal activity fatigued him greatly. BP checked but no orthostatics noted. At this time pt is unsafe to d/c home alone with PRN assistance and there are concerns re: him being able to negotiate stairs into house therefore recommending STR upon d/c to maximize independence with mobility & reduce fall risk prior to return home.   BP checked in LUE: Sitting: 105/51 mmHg (MAP 68) Standing at 0: 110/69 mmHg (MAP 69) Standing at 3: 106/47 mmHg (MAP 63)        Recommendations for follow up therapy are one component of a multi-disciplinary discharge planning process, led by the attending physician.  Recommendations may be updated based on patient status, additional functional criteria and insurance authorization.  Follow Up Recommendations SNF;Supervision for  mobility/OOB    Equipment Recommendations  Rolling walker with 5" wheels    Recommendations for Other Services       Precautions / Restrictions Precautions Precautions: Fall Restrictions Weight Bearing Restrictions: No      Mobility  Bed Mobility Overal bed mobility: Needs Assistance Bed Mobility: Supine to Sit     Supine to sit: Supervision;HOB elevated Sit to supine: Min guard   General bed mobility comments: With increased time/effort, pt able to perform bed mobility with bed features (i.e. HOB elevated for supine>sit and bed in trendelemburg position for sit>supine). Requires verbal cues for body positioning.    Transfers Overall transfer level: Needs assistance   Transfers: Sit to/from Stand Sit to Stand: Min assist;Min guard (cuing for safe hand placement, pt is able to transfer sit<>stand from EOB & from recliner)         General transfer comment: unable d/t pt reporting dizziness sitting EOB, unresolved following seated therapy exercises  Ambulation/Gait Ambulation/Gait assistance:  (Pt is able to take 3-4 side steps to R with RW then turn & sit in recliner with min assist overall. Pt reports feeling "wipe out" after that minimal activity.)              Stairs            Wheelchair Mobility    Modified Rankin (Stroke Patients Only)       Balance Overall balance assessment: Needs assistance Sitting-balance support: Feet supported Sitting balance-Leahy Scale: Good Sitting balance - Comments: SUPERVISION for static sitting balance at EOB   Standing balance support: Single extremity  supported;During functional activity Standing balance-Leahy Scale: Poor Standing balance comment: requires UE support on RW & CGA<>min assist, does tolerate standing ~2 minutes at a time                             Pertinent Vitals/Pain Pain Assessment: Faces Faces Pain Scale: Hurts little more Pain Location: B hips, shoulders, & finger  joits Pain Descriptors / Indicators: Aching Pain Intervention(s): Premedicated before session;Limited activity within patient's tolerance;Monitored during session    Home Living Family/patient expects to be discharged to:: Private residence Living Arrangements:  (fiance) Available Help at Discharge: Family;Available PRN/intermittently (fiance unable to take time off of work as she has used up all of her time off 2/2 frequent hospitalizations herself) Type of Home: House Home Access: Stairs to enter Entrance Stairs-Rails: Psychiatric nurse of Steps: 3 Home Layout: Two level;Able to live on main level with bedroom/bathroom Home Equipment: Hand held shower head;Shower seat;Walker - standard      Prior Function Level of Independence: Independent         Comments: Independent with ADLs/functional mobility. Denies fall hx. Pt is a bus driver.     Hand Dominance        Extremity/Trunk Assessment   Upper Extremity Assessment Upper Extremity Assessment: Generalized weakness    Lower Extremity Assessment Lower Extremity Assessment: Generalized weakness       Communication   Communication: No difficulties  Cognition Arousal/Alertness: Awake/alert Behavior During Therapy: Flat affect;WFL for tasks assessed/performed Overall Cognitive Status: Impaired/Different from baseline                                 General Comments: Pleasant & agreeable to tx, does report fatigue but states he has been fatigued since he started feeling bad. AxOx4      General Comments General comments (skin integrity, edema, etc.): Pt c/o dizziness with supine>sit but does not report worsening symptoms with sit>stand or standing.    Exercises General Exercises - Upper Extremity Elbow Flexion: AROM;Both;10 reps;Supine General Exercises - Lower Extremity Ankle Circles/Pumps: AROM;Both;10 reps;Seated Heel Slides: AROM;Both;10 reps;Supine Hip ABduction/ADduction:  AROM;Both;5 reps;Supine   Assessment/Plan    PT Assessment Patient needs continued PT services  PT Problem List Decreased strength;Decreased mobility;Decreased activity tolerance;Decreased balance;Decreased knowledge of use of DME;Pain       PT Treatment Interventions Therapeutic activities;Modalities;Gait training;Therapeutic exercise;Patient/family education;DME instruction;Stair training;Balance training;Functional mobility training;Neuromuscular re-education    PT Goals (Current goals can be found in the Care Plan section)  Acute Rehab PT Goals Patient Stated Goal: get better PT Goal Formulation: With patient Time For Goal Achievement: 07/18/21 Potential to Achieve Goals: Good    Frequency Min 2X/week   Barriers to discharge Decreased caregiver support;Inaccessible home environment      Co-evaluation               AM-PAC PT "6 Clicks" Mobility  Outcome Measure Help needed turning from your back to your side while in a flat bed without using bedrails?: A Little Help needed moving from lying on your back to sitting on the side of a flat bed without using bedrails?: A Little Help needed moving to and from a bed to a chair (including a wheelchair)?: A Little Help needed standing up from a chair using your arms (e.g., wheelchair or bedside chair)?: A Little Help needed to walk in hospital room?: A Little Help needed  climbing 3-5 steps with a railing? : A Lot 6 Click Score: 17    End of Session   Activity Tolerance: Patient tolerated treatment well;Patient limited by fatigue Patient left: in chair;with chair alarm set;with call bell/phone within reach Nurse Communication: Mobility status (BP) PT Visit Diagnosis: Difficulty in walking, not elsewhere classified (R26.2);Muscle weakness (generalized) (M62.81)    Time: 2902-1115 PT Time Calculation (min) (ACUTE ONLY): 19 min   Charges:   PT Evaluation $PT Eval Low Complexity: [place], PT,  DPT 07/04/21, 2:51 PM   Waunita Schooner 07/04/2021, 2:47 PM

## 2021-07-04 NOTE — Progress Notes (Signed)
PHARMACY - PHYSICIAN COMMUNICATION CRITICAL VALUE ALERT - BLOOD CULTURE IDENTIFICATION (BCID)  BCID results:  4 of 4 with Staph Aureus, no resistance.  Pt with NKDA currently not on any abx.   Name of physician contacted: Argie Ramming, MD  Changes to prescribed antibiotics required: Start Cefazolin per pharmacy consult.  Renda Rolls, PharmD, [facility] 07/04/2021 5:06 AM

## 2021-07-04 NOTE — Evaluation (Signed)
Occupational Therapy Evaluation Patient Details Name: Joshua Cole MRN: [id number] DOB: July 23, 1961 Today's Date: 07/04/2021   History of Present Illness 60 y.o. male with medical history significant of gout, alcohol use, who presents with generalized weakness, multiple joints pain, confusion, nausea, vomiting, diarrhea, fever.   Clinical Impression   Pt seen for OT evaluation this date. Upon arrival to room, pt seated upright in bed, appearing drowsy, and reporting 9/10 pain in joints (attributing to gout flare up), however agreeable to OT eval and reporting already receiving pain medication this AM. Prior to admission, pt was independent in all ADLs and functional mobility, living on the main level of a 2-story home with his fiance. 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 Pt currently presents with increased pain, limited AROM of UE, decreased strength, and decreased activity tolerance. Due to these functional impairments, pt requires SET-UP assist for drinking/feeding and MAX A for bed-level LB ADLs. With increased time/effort, pt was able to perform bed mobility MIN GUARD with use bed features (i.e. HOB elevated for supine>sit and bed in trendelemburg position for sit>supine). Further mobility deferred in setting of pt reporting dizziness while sitting EOB, which was unresolved with increased time and seated therapy exercises. Pt would benefit from additional skilled OT services to maximize return to PLOF and minimize risk of future falls, injury, caregiver burden, and readmission. Upon discharge, recommend SNF (however may progress to home with [place] with improved pain management).       Recommendations for follow up therapy are one component of a multi-disciplinary discharge planning process, led by the attending physician.  Recommendations may be updated based on patient status, additional functional criteria and insurance authorization.   Follow Up Recommendations  SNF     Equipment Recommendations  Other (comment) (defer to next venue of care)       Precautions / Restrictions Precautions Precautions: Fall Restrictions Weight Bearing Restrictions: No      Mobility Bed Mobility Overal bed mobility: Needs Assistance Bed Mobility: Supine to Sit;Sit to Supine     Supine to sit: Min guard;HOB elevated Sit to supine: Min guard   General bed mobility comments: With increased time/effort, pt able to perform bed mobility with bed features (i.e. HOB elevated for supine>sit and bed in trendelemburg position for sit>supine). Requires verbal cues for body positioning.    Transfers                 General transfer comment: unable d/t pt reporting dizziness sitting EOB, unresolved following seated therapy exercises    Balance Overall balance assessment: Needs assistance Sitting-balance support: Bilateral upper extremity supported;Feet supported Sitting balance-Leahy Scale: Fair Sitting balance - Comments: SUPERVISION for static sitting balance at EOB                                   ADL either performed or assessed with clinical judgement   ADL Overall ADL's : Needs assistance/impaired Eating/Feeding: Set up;Sitting Eating/Feeding Details (indicate cue type and reason): with increased time/effort, pt able to bring cup to mouth with RUE and drink from straw                 Lower Body Dressing: Maximal assistance;Bed level Lower Body Dressing Details (indicate cue type and reason): pt put forth good effort to don socks via figure-4 position, however unable d/t joint pain and ultimately requiring MAX A to don socks  Pertinent Vitals/Pain Pain Assessment: 0-10 Pain Score: 9  Pain Location: b/l shoulders, b/l hips Pain Descriptors / Indicators: Aching Pain Intervention(s): Limited activity within patient's tolerance;Monitored during session;Premedicated before session     Hand Dominance  Right   Extremity/Trunk Assessment Upper Extremity Assessment Upper Extremity Assessment: Generalized weakness (unable to fully assess strength d/t joint pain, however appears to have full AROM in RUE. AROM LUE limited by pain)   Lower Extremity Assessment Lower Extremity Assessment: Generalized weakness (unable to fully assess strength d/t joint pain)       Communication Communication Communication: No difficulties   Cognition Arousal/Alertness: Lethargic;Suspect due to medications Behavior During Therapy: [facility] for tasks assessed/performed Overall Cognitive Status: Impaired/Different from baseline                                 General Comments: Pt alert and oriented to self, place, and situation. Pt with eyes intermittently closing during session, with pt reporting drowsiness      Exercises General Exercises - Upper Extremity Elbow Flexion: AROM;Both;10 reps;Supine General Exercises - Lower Extremity Ankle Circles/Pumps: AROM;Both;10 reps;Seated Heel Slides: AROM;Both;10 reps;Supine Hip ABduction/ADduction: AROM;Both;5 reps;Supine        Home Living Family/patient expects to be discharged to:: Private residence Living Arrangements: Spouse/significant other (fiance) Available Help at Discharge: Family;Available PRN/intermittently (Daughter lives nearby) Type of Home: House Home Access: Stairs to enter Technical brewer of Steps: 3 Entrance Stairs-Rails: Right;Left Home Layout: Two level;Able to live on main level with bedroom/bathroom (doesnt use 2nd level)     Bathroom Shower/Tub: Tub/shower unit;Walk-in shower   Bathroom Toilet: Standard     Home Equipment: Hand held shower head;Shower seat;Walker - standard          Prior Functioning/Environment Level of Independence: Independent        Comments: Independent with ADLs/functional mobility. Denies fall hx. Pt is a bus driver.        OT Problem List: Decreased strength;Decreased  activity tolerance;Impaired balance (sitting and/or standing);Pain      OT Treatment/Interventions: Self-care/ADL training;Therapeutic exercise;Therapeutic activities;Patient/family education;Balance training    OT Goals(Current goals can be found in the care plan section) Acute Rehab OT Goals Patient Stated Goal: to return to work OT Goal Formulation: With patient Time For Goal Achievement: 07/18/21 Potential to Achieve Goals: Fair ADL Goals Pt Will Perform Grooming: with set-up;with supervision;sitting Pt Will Perform Upper Body Dressing: with supervision;with set-up;sitting Pt Will Transfer to Toilet: with min assist;stand pivot transfer;bedside commode  OT Frequency: Min 1X/week    AM-PAC OT "6 Clicks" Daily Activity     Outcome Measure Help from another person eating meals?: A Little Help from another person taking care of personal grooming?: A Little Help from another person toileting, which includes using toliet, bedpan, or urinal?: A Lot Help from another person bathing (including washing, rinsing, drying)?: A Lot Help from another person to put on and taking off regular upper body clothing?: A Lot Help from another person to put on and taking off regular lower body clothing?: A Lot 6 Click Score: 14   End of Session Nurse Communication: Mobility status  Activity Tolerance: Patient limited by pain Patient left: in bed;with call bell/phone within reach;with bed alarm set  OT Visit Diagnosis: Muscle weakness (generalized) (M62.81);Pain Pain - Right/Left: Right (bilateraly) Pain - part of body: Shoulder;Hip;Knee                Time: 9169-4503 OT Time Calculation (  min): 36 min Charges:  OT General Charges $OT Visit: 1 Visit OT Evaluation $OT Eval Moderate Complexity: 1 Mod OT Treatments $Self Care/Home Management : 8-22 mins $Therapeutic Activity: 8-22 mins  Fredirick Maudlin, OTR/L [place]

## 2021-07-04 NOTE — Consult Note (Signed)
Brief ID automatic consult note for staff aureus bacteremia:  Patient was admitted overnight to [facility] after presenting with generalized weakness, multiple joint pains, confusion, nausea, vomiting, fevers.  He was found to meet sepsis criteria on admission and blood cultures were obtained which are positive in 4 out of 4 bottles with MSSA detected on BC ID.  Patient has been started on cefazolin by the primary team and a transthoracic echocardiogram has been ordered.  Patient had MRI of the brain with and without contrast that showed no acute intracranial process and chest x-ray was also negative.  Patient did complain of multiple joint pains on admission that was concerning for possible gout flareup and was started on Solu-Medrol 40 mg daily.  He had also been on steroids since 07/01/2021 when he was evaluated at an urgent care.  Will continue cefazolin as ordered, follow-up transthoracic echocardiogram.  Recommend stopping systemic steroids in the setting of bacteremia and closely examine joints for possible metastatic foci of infection.  Primary ID team will follow up tomorrow at [facility].    Raynelle Highland for Infectious Disease [facility] 07/04/2021, 10:29 AM

## 2021-07-04 NOTE — Consult Note (Signed)
[facility] [facility] CONSULT NOTE  Patient Care Team: Pcp, No as PCP - General  CHIEF COMPLAINTS/PURPOSE OF CONSULTATION:  Thrombocytopenia/mental status changes  HISTORY OF PRESENTING ILLNESS:  Joshua Cole 60 y.o.  male with no significant past medical history is currently admitted hospital for fever of 102 and chills with multiple joint pains.  Patient also had episode of nausea vomiting and diarrhea which currently resolved.  As per review of chart patient was mildly confused.  Of note patient received COVID/flu shot approximately 8 days ago.  Patient was recently seen in the urgent care-and started on steroids.  Patient was negative for COVID/flu.   On admission to hospital patient noted to have platelets of 68 and white count of 17.  Hemoglobin renal function normal.  Given the mild confusion/thrombocytopenia-hematology has been consulted for to evaluate/rule out TTP.  Patient states his mentation is back to baseline.  Denies any seizure-like activity.  Admits to mild headache which is overall improved.   Review of Systems  Constitutional:  Positive for malaise/fatigue. Negative for chills, diaphoresis, fever and weight loss.  HENT:  Negative for nosebleeds and sore throat.   Eyes:  Negative for double vision.  Respiratory:  Positive for shortness of breath. Negative for cough, hemoptysis, sputum production and wheezing.   Cardiovascular:  Negative for chest pain, palpitations, orthopnea and leg swelling.  Gastrointestinal:  Positive for diarrhea, nausea and vomiting. Negative for abdominal pain, blood in stool, constipation, heartburn and melena.  Genitourinary:  Negative for dysuria, frequency and urgency.  Musculoskeletal:  Positive for back pain, joint pain and myalgias.  Skin: Negative.  Negative for itching and rash.  Neurological:  Positive for dizziness and weakness. Negative for tingling, focal weakness and headaches.  Endo/Heme/Allergies:  Does not bruise/bleed  easily.  Psychiatric/Behavioral:  Negative for depression. The patient is nervous/anxious. The patient does not have insomnia.     MEDICAL HISTORY:  Past Medical History:  Diagnosis Date   Arthritis    DJD (degenerative joint disease)    Gout     SURGICAL HISTORY: Past Surgical History:  Procedure Laterality Date   JOINT REPLACEMENT      SOCIAL HISTORY: Social History   Socioeconomic History   Marital status: Unknown    Spouse name: Not on file   Number of children: Not on file   Years of education: Not on file   Highest education level: Not on file  Occupational History   Not on file  Tobacco Use   Smoking status: Never   Smokeless tobacco: Never  Substance and Sexual Activity   Alcohol use: Yes    Comment: bilateral hip replacement   Drug use: Never   Sexual activity: Not on file  Other Topics Concern   Not on file  Social History Narrative   Not on file   Social Determinants of Health   Financial Resource Strain: Not on file  Food Insecurity: Not on file  Transportation Needs: Not on file  Physical Activity: Not on file  Stress: Not on file  Social Connections: Not on file  Intimate Partner Violence: Not on file    FAMILY HISTORY: Family History  Problem Relation Age of Onset   Psoriasis Sister     ALLERGIES:  has No Known Allergies.  MEDICATIONS:  Current Facility-Administered Medications  Medication Dose Route Frequency Provider Last Rate Last Admin   0.9 %  sodium chloride infusion   Intravenous Continuous Ivor Costa, MD 75 mL/hr at 07/03/21 2355 New Bag at 07/03/21  2355   acetaminophen (TYLENOL) tablet 650 mg  650 mg Oral Q6H PRN Ivor Costa, MD       insulin aspart (novoLOG) injection 0-5 Units  0-5 Units Subcutaneous QHS Ivor Costa, MD   3 Units at 07/03/21 2216   insulin aspart (novoLOG) injection 0-9 Units  0-9 Units Subcutaneous TID WC Ivor Costa, MD   5 Units at 07/03/21 1712   methylPREDNISolone sodium succinate (SOLU-MEDROL) 40 mg/mL  injection 40 mg  40 mg Intravenous Daily Ivor Costa, MD   40 mg at 07/03/21 1711   ondansetron (ZOFRAN) injection 4 mg  4 mg Intravenous Q8H PRN Ivor Costa, MD       oxyCODONE-acetaminophen (PERCOCET/ROXICET) 5-325 MG per tablet 1 tablet  1 tablet Oral Q4H PRN Ivor Costa, MD   1 tablet at 07/03/21 2216      .  PHYSICAL EXAMINATION:  Vitals:   07/03/21 2040 07/04/21 0040  BP: (!) 108/57 (!) 112/55  Pulse: 65 63  Resp: 20 20  Temp: 97.8 F (36.6 C) 98 F (36.7 C)  SpO2: 94% 98%   Filed Weights   07/03/21 0954 07/03/21 1145  Weight: 232 lb (105.2 kg) 249 lb 4.8 oz (113.1 kg)    Physical Exam Vitals and nursing note reviewed.  HENT:     Head: Normocephalic and atraumatic.     Mouth/Throat:     Pharynx: Oropharynx is clear.  Eyes:     Extraocular Movements: Extraocular movements intact.     Pupils: Pupils are equal, round, and reactive to light.  Cardiovascular:     Rate and Rhythm: Normal rate and regular rhythm.  Pulmonary:     Comments: Decreased breath sounds bilaterally.  Abdominal:     Palpations: Abdomen is soft.  Musculoskeletal:        General: Normal range of motion.     Cervical back: Normal range of motion.  Skin:    General: Skin is warm.  Neurological:     General: No focal deficit present.     Mental Status: He is alert and oriented to person, place, and time.  Psychiatric:        Behavior: Behavior normal.        Judgment: Judgment normal.     LABORATORY DATA:  I have reviewed the data as listed Lab Results  Component Value Date   WBC 13.1 (H) 07/03/2021   HGB 15.1 07/03/2021   HCT 41.9 07/03/2021   MCV 91.1 07/03/2021   PLT 67 (L) 07/03/2021   Recent Labs    07/03/21 0959 07/03/21 1655 07/03/21 2301  NA 122* 124* 122*  K 3.4* 3.4* 4.1  CL 83* 85* 87*  CO2 26 27 24   GLUCOSE 337* 267* 312*  BUN 29* 31* 32*  CREATININE 1.13 1.19 0.99  CALCIUM 8.3* 8.1* 7.8*  GFRNONAA >60 >60 >60    RADIOGRAPHIC STUDIES: I have personally  reviewed the radiological images as listed and agreed with the findings in the report. MR BRAIN W WO CONTRAST  Result Date: 07/03/2021 CLINICAL DATA:  Mental status change, unknown cause EXAM: MRI HEAD WITHOUT AND WITH CONTRAST TECHNIQUE: Multiplanar, multiecho pulse sequences of the brain and surrounding structures were obtained without and with intravenous contrast. CONTRAST:  31mL GADAVIST GADOBUTROL 1 MMOL/ML IV SOLN COMPARISON:  None. FINDINGS: Brain: No acute infarction, hemorrhage, hydrocephalus, extra-axial collection, or mass lesion. The ventricles and sulci are within normal limits for age. No abnormal enhancement. No foci of hemosiderin deposition to suggest remote hemorrhage. Vascular: Normal  flow voids. Skull and upper cervical spine: Normal marrow signal. Sinuses/Orbits: Small mucous retention cyst in the left maxillary sinus. Otherwise negative. Other: Trace fluid in right mastoid air cells. IMPRESSION: No acute intracranial process. Electronically Signed   By: Merilyn Baba M.D.   On: 07/03/2021 22:45   DG Chest Portable 1 View  Result Date: 07/03/2021 CLINICAL DATA:  sob/cough EXAM: PORTABLE CHEST 1 VIEW COMPARISON:  None. FINDINGS: The cardiomediastinal silhouette is mildly enlarged in contour. No pleural effusion. No pneumothorax. No acute pleuroparenchymal abnormality. Visualized abdomen is unremarkable. IMPRESSION: No acute cardiopulmonary abnormality. Electronically Signed   By: Valentino Saxon M.D.   On: 07/03/2021 12:28    Thrombocytopenia [facility]) #60 year old male patient with no significant past medical history-is currently admitted to the hospital for myalgias/joint pains/headaches subjective fevers s/p COVID vaccination [approximately 8 days ago]-noted to have moderate thrombocytopenia on admission.  Patient also noted to have mental status changes/confusion.  #Acute thrombocytopenia-68; normal hemoglobin/white count-suspect immune related/vaccination related versus  alcohol.  Given the absence of anemia/or abnormal renal function-clinically not suggestive of TTP.-See below-  LDH mildly elevated.   #Mental status changes/mild confusion headaches as per history.  However, patient clinically appears to baseline at this time.  Question acute metabolic encephalopathy versus thrombotic phenomena from postvaccine coagulation abnormalities.[VITT] versus other causes like alcohol drawl/hyponatremia/delirium.  Recommendations/plan:  #Personal review of smear-does not show any evidence of schistocytes; does show moderate thrombocytopenia without any evidence of platelet clumping.  Also check platelet factor antibodies.  Hence, based on symptoms/labs-not suggestive of TTP.  #However in presence of subtle mental status changes and headaches/thrombocytopenia s/p COVID vaccination-vaccine induced thrombotic thrombocytopenia [VITT] is in the differential-although statistically very rare phenomena.  Recommend MRI of the brain for further evaluation.   Thank you Dr. Blaine Hamper for allowing me to participate in the care of your pleasant patient. Please do not hesitate to contact me with questions or concerns in the interim.   Addendum: MRI brain negative for any acute thrombotic abnormalities.  Discussed with Dr.Niu-that if patient's mental status changes do not improve/or is not at baseline-recommend stat neurology evaluation for consideration of VITT [even in the absence of any significant MRI findings].  If suspected VITT-anticoagulation with heparin should be started; and IVIG considered.   All questions were answered. The patient knows to call the clinic with any problems, questions or concerns.    Cammie Sickle, MD

## 2021-07-04 NOTE — Progress Notes (Addendum)
PROGRESS NOTE    Joshua Cole  LJQ:[id number] DOB: 1961/07/02 DOA: 07/03/2021 PCP: Pcp, No    Brief Narrative:  60 y.o. male with medical history significant of gout, alcohol use, who presents with generalized weakness, multiple joints pain, confusion, nausea, vomiting, diarrhea, fever.   Per his fiance (I called her fianc by phone), patient received flu shot and COVID-vaccine booster on Friday.  In Saturday night, patient had fever of 102 and chills.  He complains of multiple joint pain, including right hand, left shoulder, bilateral hip and lower back.  He has nausea, vomiting and few times of watery diarrhea, which has resolved per patient.  Patient has mild dry cough, mild shortness of breath, denies chest pain. Patient was found to be intermittently confused.  When saw patient in the ED, patient is mildly confused, but is still oriented x3.  He moves all extremities.  No facial droop or slurred speech.  Denies symptoms of UTI.  Her fianc states that patient was seen in urgent care and was tested negative for flu and COVID, but started him on Tamiflu and steroid on 10/20.  Blood cultures positive for MSSA.  Pharmacy contacted attending provider who added IV cefazolin   Assessment & Plan:   Principal Problem:   Hyponatremia Active Problems:   Acute metabolic encephalopathy   Joint pain   Generalized weakness   SIRS (systemic inflammatory response syndrome) (HCC)   Hypokalemia   Hyperglycemia   Thrombocytopenia (HCC)   Nausea vomiting and diarrhea   Gout  MSSA bacteremia Sepsis secondary to above Sepsis criteria met with leukocytosis and tachycardia Source of bacteremia is unclear Patient was started on Ancef Sepsis physiology improving Plan: Continue Ancef Continue IV fluids Continue to follow blood cultures Repeat blood cultures 10/20 4 AM Check echocardiogram Reflex ID consult Stopped systemic steroids  Acute metabolic encephalopathy Etiology not entirely  clear TTP ruled out after oncology reviewed peripheral smear MRI reassuring Low suspicion for intracranial thrombosis related to COVID-vaccine Mental status seems to be improving Suspect related to bacteremia Plan: Frequent neurochecks  Diffuse joint pain Generalized weakness Possible exacerbation of gout Would be atypical presentation Has been on steroids since 10/20 Plan: Stop steroids in the setting of bacteremia Start Toradol 15 mg every 6 hours As needed narcotics for breakthrough pain Therapy evaluations when able  Thrombocytopenia Platelets and 60s, no active bleeding Suspect secondary to alcohol intake TTP unlikely due to lack of schistocytes on peripheral smear Appreciate oncology follow-up  Hyponatremia Likely secondary to alcohol abuse and poor oral intake Continue to normal saline 75 cc/h  Hypokalemia Monitor and replace potassium as necessary  Hyperglycemia Likely related to recent steroid use  Intractable nausea vomiting Diarrhea Resolved    DVT prophylaxis: SCD Code Status: Full Family Communication: Garen Grams [phone].  Disposition Plan: Status is: Inpatient  Remains inpatient appropriate because: Sepsis, MSSA bacteremia, acute metabolic encephalopathy.  Disposition plan pending.  Not medically ready for discharge at this time.       Level of care: Med-Surg  Consultants:  ID Heme-onc  Procedures:  None  Antimicrobials: Cefazolin   Subjective: Seen and examined.  Alert oriented x3.  Continues to endorse diffuse weakness and joint pain.  Objective: Vitals:   07/03/21 2040 07/04/21 0040 07/04/21 0440 07/04/21 0830  BP: (!) 108/57 (!) 112/55 124/62 (!) 148/72  Pulse: 65 63 73 80  Resp: '20 20 20 14  ' Temp: 97.8 F (36.6 C) 98 F (36.7 C) 98.3 F (36.8 C) 98.9 F (37.2 C)  TempSrc: Oral Oral Oral   SpO2: 94% 98% 96% 97%  Weight:      Height:        Intake/Output Summary (Last 24 hours) at 07/04/2021 1024 Last  data filed at 07/04/2021 0727 Gross per 24 hour  Intake 1093.24 ml  Output 2575 ml  Net -1481.76 ml   Filed Weights   07/03/21 0954 07/03/21 1145  Weight: 105.2 kg 113.1 kg    Examination:  General exam: No acute distress.  Appears fatigued Respiratory system: Lungs clear.  Normal work breathing.  Room air Cardiovascular system: S1-S2, RRR, no murmurs, no pedal edema Gastrointestinal system: Soft, NT/ND, normal bowel sounds Central nervous system: Alert and oriented. No focal neurological deficits.  Lethargic Extremities: Decreased ROM and power in upper extremities.  Greater on right.  Gait not assessed Skin: No rashes, lesions or ulcers Psychiatry: Judgement and insight appear impaired. Mood & affect flattened.     Data Reviewed: I have personally reviewed following labs and imaging studies  CBC: Recent Labs  Lab 07/03/21 0959 07/03/21 1655 07/04/21 0506  WBC 17.4* 13.1* 16.2*  NEUTROABS  --  11.5*  --   HGB 14.8 15.1 14.3  HCT 39.5 41.9 38.7*  MCV 88.6 91.1 94.2  PLT 68* 67* 59*   Basic Metabolic Panel: Recent Labs  Lab 07/03/21 0959 07/03/21 1655 07/03/21 2301 07/04/21 0506  NA 122* 124* 122* 128*  K 3.4* 3.4* 4.1 4.1  CL 83* 85* 87* 94*  CO2 '26 27 24 27  ' GLUCOSE 337* 267* 312* 281*  BUN 29* 31* 32* 29*  CREATININE 1.13 1.19 0.99 0.91  CALCIUM 8.3* 8.1* 7.8* 7.8*  MG  --  2.2  --   --   PHOS  --  3.4  --   --    GFR: Estimated Creatinine Clearance: 111.4 mL/min (by C-G formula based on SCr of 0.91 mg/dL). Liver Function Tests: No results for input(s): AST, ALT, ALKPHOS, BILITOT, PROT, ALBUMIN in the last 168 hours. No results for input(s): LIPASE, AMYLASE in the last 168 hours. No results for input(s): AMMONIA in the last 168 hours. Coagulation Profile: Recent Labs  Lab 07/03/21 1655 07/04/21 0506  INR 1.1 1.2   Cardiac Enzymes: No results for input(s): CKTOTAL, CKMB, CKMBINDEX, TROPONINI in the last 168 hours. BNP (last 3 results) No  results for input(s): PROBNP in the last 8760 hours. HbA1C: No results for input(s): HGBA1C in the last 72 hours. CBG: Recent Labs  Lab 07/03/21 1148 07/03/21 1301 07/03/21 1404 07/03/21 1648 07/03/21 2050  GLUCAP 368* 283* 264* 255* 261*   Lipid Profile: No results for input(s): CHOL, HDL, LDLCALC, TRIG, CHOLHDL, LDLDIRECT in the last 72 hours. Thyroid Function Tests: No results for input(s): TSH, T4TOTAL, FREET4, T3FREE, THYROIDAB in the last 72 hours. Anemia Panel: No results for input(s): VITAMINB12, FOLATE, FERRITIN, TIBC, IRON, RETICCTPCT in the last 72 hours. Sepsis Labs: No results for input(s): PROCALCITON, LATICACIDVEN in the last 168 hours.  Recent Results (from the past 240 hour(s))  Resp Panel by RT-PCR (Flu A&B, Covid) Nasopharyngeal Swab     Status: None   Collection Time: 07/03/21 12:08 PM   Specimen: Nasopharyngeal Swab; Nasopharyngeal(NP) swabs in vial transport medium  Result Value Ref Range Status   SARS Coronavirus 2 by RT PCR NEGATIVE NEGATIVE Final    Comment: (NOTE) SARS-CoV-2 target nucleic acids are NOT DETECTED.  The SARS-CoV-2 RNA is generally detectable in upper respiratory specimens during the acute phase of infection. The lowest concentration of  SARS-CoV-2 viral copies this assay can detect is 138 copies/mL. A negative result does not preclude SARS-Cov-2 infection and should not be used as the sole basis for treatment or other patient management decisions. A negative result may occur with  improper specimen collection/handling, submission of specimen other than nasopharyngeal swab, presence of viral mutation(s) within the areas targeted by this assay, and inadequate number of viral copies(<138 copies/mL). A negative result must be combined with clinical observations, patient history, and epidemiological information. The expected result is Negative.  Fact Sheet for Patients:  EntrepreneurPulse.com.au  Fact Sheet for  Healthcare Providers:  IncredibleEmployment.be  This test is no t yet approved or cleared by the [place] FDA and  has been authorized for detection and/or diagnosis of SARS-CoV-2 by FDA under an Emergency Use Authorization (EUA). This EUA will remain  in effect (meaning this test can be used) for the duration of the COVID-19 declaration under Section 564(b)(1) of the Act, 21 U.S.C.section 360bbb-3(b)(1), unless the authorization is terminated  or revoked sooner.       Influenza A by PCR NEGATIVE NEGATIVE Final   Influenza B by PCR NEGATIVE NEGATIVE Final    Comment: (NOTE) The Xpert Xpress SARS-CoV-2/FLU/RSV plus assay is intended as an aid in the diagnosis of influenza from Nasopharyngeal swab specimens and should not be used as a sole basis for treatment. Nasal washings and aspirates are unacceptable for Xpert Xpress SARS-CoV-2/FLU/RSV testing.  Fact Sheet for Patients: EntrepreneurPulse.com.au  Fact Sheet for Healthcare Providers: IncredibleEmployment.be  This test is not yet approved or cleared by the [place] FDA and has been authorized for detection and/or diagnosis of SARS-CoV-2 by FDA under an Emergency Use Authorization (EUA). This EUA will remain in effect (meaning this test can be used) for the duration of the COVID-19 declaration under Section 564(b)(1) of the Act, 21 U.S.C. section 360bbb-3(b)(1), unless the authorization is terminated or revoked.  Performed at [facility], [address] [place], [place] [address]   Culture, blood (x 2)     Status: None (Preliminary result)   Collection Time: 07/03/21  4:55 PM   Specimen: BLOOD  Result Value Ref Range Status   Specimen Description BLOOD LEFT ANTECUBITAL  Final   Special Requests   Final    BOTTLES DRAWN AEROBIC AND ANAEROBIC Blood Culture results may not be optimal due to an excessive volume of blood received in culture bottles    Culture  Setup Time   Final    Organism ID to follow IN BOTH AEROBIC AND ANAEROBIC BOTTLES GRAM POSITIVE COCCI CRITICAL RESULT CALLED TO, READ BACK BY AND VERIFIED WITH: NATHAN BELUE AT 2202 07/04/21.PMF Performed at [facility], [address] [place], [place] [address]    Culture [facility] POSITIVE COCCI  Final   Report Status PENDING  Incomplete  Blood Culture ID Panel (Reflexed)     Status: Abnormal   Collection Time: 07/03/21  4:55 PM  Result Value Ref Range Status   Enterococcus faecalis NOT DETECTED NOT DETECTED Final   Enterococcus Faecium NOT DETECTED NOT DETECTED Final   Listeria monocytogenes NOT DETECTED NOT DETECTED Final   Staphylococcus species DETECTED (A) NOT DETECTED Final    Comment: CRITICAL RESULT CALLED TO, READ BACK BY AND VERIFIED WITH: NATHAN BELUE AT 6237 07/04/21.PMF    Staphylococcus aureus (BCID) DETECTED (A) NOT DETECTED Final    Comment: CRITICAL RESULT CALLED TO, READ BACK BY AND VERIFIED WITH: NATHAN BELUE AT 6283 07/04/21.PMF    Staphylococcus epidermidis NOT DETECTED  NOT DETECTED Final   Staphylococcus lugdunensis NOT DETECTED NOT DETECTED Final   Streptococcus species NOT DETECTED NOT DETECTED Final   Streptococcus agalactiae NOT DETECTED NOT DETECTED Final   Streptococcus pneumoniae NOT DETECTED NOT DETECTED Final   Streptococcus pyogenes NOT DETECTED NOT DETECTED Final   A.calcoaceticus-baumannii NOT DETECTED NOT DETECTED Final   Bacteroides fragilis NOT DETECTED NOT DETECTED Final   Enterobacterales NOT DETECTED NOT DETECTED Final   Enterobacter cloacae complex NOT DETECTED NOT DETECTED Final   Escherichia coli NOT DETECTED NOT DETECTED Final   Klebsiella aerogenes NOT DETECTED NOT DETECTED Final   Klebsiella oxytoca NOT DETECTED NOT DETECTED Final   Klebsiella pneumoniae NOT DETECTED NOT DETECTED Final   Proteus species NOT DETECTED NOT DETECTED Final   Salmonella species NOT DETECTED NOT DETECTED Final   Serratia marcescens NOT  DETECTED NOT DETECTED Final   Haemophilus influenzae NOT DETECTED NOT DETECTED Final   Neisseria meningitidis NOT DETECTED NOT DETECTED Final   Pseudomonas aeruginosa NOT DETECTED NOT DETECTED Final   Stenotrophomonas maltophilia NOT DETECTED NOT DETECTED Final   Candida albicans NOT DETECTED NOT DETECTED Final   Candida auris NOT DETECTED NOT DETECTED Final   Candida glabrata NOT DETECTED NOT DETECTED Final   Candida krusei NOT DETECTED NOT DETECTED Final   Candida parapsilosis NOT DETECTED NOT DETECTED Final   Candida tropicalis NOT DETECTED NOT DETECTED Final   Cryptococcus neoformans/gattii NOT DETECTED NOT DETECTED Final   Meth resistant mecA/C and MREJ NOT DETECTED NOT DETECTED Final    Comment: Performed at [facility], [address] [place], [place] [address]  Culture, blood (x 2)     Status: None (Preliminary result)   Collection Time: 07/03/21  6:37 PM   Specimen: BLOOD  Result Value Ref Range Status   Specimen Description BLOOD RIGHT HAND  Final   Special Requests   Final    BOTTLES DRAWN AEROBIC AND ANAEROBIC Blood Culture adequate volume   Culture  Setup Time   Final    GRAM POSITIVE COCCI IN BOTH AEROBIC AND ANAEROBIC BOTTLES CRITICAL RESULT CALLED TO, READ BACK BY AND VERIFIED WITH: NATHAN BELUE AT 5277 07/04/21.PMF Performed at [facility], [address] [place], [place] [address]    Culture GRAM POSITIVE COCCI  Final   Report Status PENDING  Incomplete         Radiology Studies: MR BRAIN W WO CONTRAST  Result Date: 07/03/2021 CLINICAL DATA:  Mental status change, unknown cause EXAM: MRI HEAD WITHOUT AND WITH CONTRAST TECHNIQUE: Multiplanar, multiecho pulse sequences of the brain and surrounding structures were obtained without and with intravenous contrast. CONTRAST:  41m GADAVIST GADOBUTROL 1 MMOL/ML IV SOLN COMPARISON:  None. FINDINGS: Brain: No acute infarction, hemorrhage, hydrocephalus, extra-axial collection, or mass lesion. The  ventricles and sulci are within normal limits for age. No abnormal enhancement. No foci of hemosiderin deposition to suggest remote hemorrhage. Vascular: Normal flow voids. Skull and upper cervical spine: Normal marrow signal. Sinuses/Orbits: Small mucous retention cyst in the left maxillary sinus. Otherwise negative. Other: Trace fluid in right mastoid air cells. IMPRESSION: No acute intracranial process. Electronically Signed   By: AMerilyn BabaM.D.   On: 07/03/2021 22:45   DG Chest Portable 1 View  Result Date: 07/03/2021 CLINICAL DATA:  sob/cough EXAM: PORTABLE CHEST 1 VIEW COMPARISON:  None. FINDINGS: The cardiomediastinal silhouette is mildly enlarged in contour. No pleural effusion. No pneumothorax. No acute pleuroparenchymal abnormality. Visualized abdomen is unremarkable. IMPRESSION: No acute cardiopulmonary abnormality. Electronically Signed  By: Valentino Saxon M.D.   On: 07/03/2021 12:28        Scheduled Meds:  insulin aspart  0-5 Units Subcutaneous QHS   insulin aspart  0-9 Units Subcutaneous TID WC   methylPREDNISolone (SOLU-MEDROL) injection  40 mg Intravenous Daily   Continuous Infusions:  sodium chloride 75 mL/hr at 07/03/21 2355    ceFAZolin (ANCEF) IV 2 g (07/04/21 0530)     LOS: 1 day    Time spent: 35 minutes    Sidney Ace, MD [facility]   If 7PM-7AM, please contact night-coverage  07/04/2021, 10:24 AM

## 2021-07-04 NOTE — Progress Notes (Signed)
*  PRELIMINARY RESULTS* Echocardiogram 2D Echocardiogram has been performed.  Joshua Cole 07/04/2021, 2:28 PM

## 2021-07-04 NOTE — Assessment & Plan Note (Addendum)
#  60 year old male patient with no significant past medical history-is currently admitted to the hospital for myalgias/joint pains/headaches subjective fevers s/p COVID vaccination [approximately 8 days ago]-noted to have moderate thrombocytopenia on admission.  Patient also noted to have mental status changes/confusion.  #Acute thrombocytopenia-68; normal hemoglobin/white count-suspect immune related/vaccination related versus alcohol.  Given the absence of anemia/or abnormal renal function-clinically not suggestive of TTP.-See below-  LDH mildly elevated.   #Mental status changes/mild confusion headaches as per history.  However, patient clinically appears to baseline at this time.  Question acute metabolic encephalopathy versus thrombotic phenomena from postvaccine coagulation abnormalities.[VITT] versus other causes like alcohol drawl/hyponatremia/delirium.  Recommendations/plan:  #Personal review of smear-does not show any evidence of schistocytes; does show moderate thrombocytopenia without any evidence of platelet clumping.  Also check platelet factor antibodies.  Hence, based on symptoms/labs-not suggestive of TTP.  #However in presence of subtle mental status changes and headaches/thrombocytopenia s/p COVID vaccination-vaccine induced thrombotic thrombocytopenia [VITT] is in the differential-although statistically very rare phenomena.  Recommend MRI of the brain for further evaluation.   Thank you Dr. Blaine Hamper for allowing me to participate in the care of your pleasant patient. Please do not hesitate to contact me with questions or concerns in the interim.   Addendum: MRI brain negative for any acute thrombotic abnormalities.  Discussed with Dr.Niu-that if patient's mental status changes do not improve/or is not at baseline-recommend stat neurology evaluation for consideration of VITT [even in the absence of any significant MRI findings].  If suspected VITT-anticoagulation with heparin should be  started; and IVIG considered.

## 2021-07-05 ENCOUNTER — Inpatient Hospital Stay: Payer: Self-pay

## 2021-07-05 ENCOUNTER — Encounter: Payer: Self-pay | Admitting: Internal Medicine

## 2021-07-05 DIAGNOSIS — B9561 Methicillin susceptible Staphylococcus aureus infection as the cause of diseases classified elsewhere: Secondary | ICD-10-CM

## 2021-07-05 DIAGNOSIS — R7881 Bacteremia: Secondary | ICD-10-CM

## 2021-07-05 LAB — BASIC METABOLIC PANEL
Anion gap: 8 (ref 5–15)
BUN: 30 mg/dL — ABNORMAL HIGH (ref 6–20)
CO2: 27 mmol/L (ref 22–32)
Calcium: 7.8 mg/dL — ABNORMAL LOW (ref 8.9–10.3)
Chloride: 92 mmol/L — ABNORMAL LOW (ref 98–111)
Creatinine, Ser: 0.87 mg/dL (ref 0.61–1.24)
GFR, Estimated: >60 mL/min (ref >60)
Glucose, Bld: 282 mg/dL — ABNORMAL HIGH (ref 70–99)
Potassium: 3.7 mmol/L (ref 3.5–5.1)
Sodium: 127 mmol/L — ABNORMAL LOW (ref 135–145)

## 2021-07-05 LAB — URINE CULTURE

## 2021-07-05 LAB — GLUCOSE, CAPILLARY
Glucose-Capillary: 286 mg/dL — ABNORMAL HIGH (ref 70–99)
Glucose-Capillary: 248 mg/dL — ABNORMAL HIGH (ref 70–99)
Glucose-Capillary: 179 mg/dL — ABNORMAL HIGH (ref 70–99)
Glucose-Capillary: 344 mg/dL — ABNORMAL HIGH (ref 70–99)

## 2021-07-05 LAB — CBC WITH DIFFERENTIAL/PLATELET
Abs Immature Granulocytes: 0.35 10*3/uL — ABNORMAL HIGH (ref 0.00–0.07)
Basophils Absolute: 0.1 10*3/uL (ref 0.0–0.1)
Basophils Relative: 0 %
Eosinophils Absolute: 0 10*3/uL (ref 0.0–0.5)
Eosinophils Relative: 0 %
HCT: 34.6 % — ABNORMAL LOW (ref 39.0–52.0)
Hemoglobin: 12.6 g/dL — ABNORMAL LOW (ref 13.0–17.0)
Immature Granulocytes: 2 %
Lymphocytes Relative: 7 %
Lymphs Abs: 1.1 10*3/uL (ref 0.7–4.0)
MCH: 34.2 pg — ABNORMAL HIGH (ref 26.0–34.0)
MCHC: 36.4 g/dL — ABNORMAL HIGH (ref 30.0–36.0)
MCV: 94 fL (ref 80.0–100.0)
Monocytes Absolute: 1.1 10*3/uL — ABNORMAL HIGH (ref 0.1–1.0)
Monocytes Relative: 7 %
Neutro Abs: 13.3 10*3/uL — ABNORMAL HIGH (ref 1.7–7.7)
Neutrophils Relative %: 84 %
Platelets: 71 10*3/uL — ABNORMAL LOW (ref 150–400)
RBC: 3.68 MIL/uL — ABNORMAL LOW (ref 4.22–5.81)
RDW: 12.2 % (ref 11.5–15.5)
Smear Review: NORMAL
WBC: 15.9 10*3/uL — ABNORMAL HIGH (ref 4.0–10.5)
nRBC: 0 % (ref 0.0–0.2)

## 2021-07-05 LAB — ECHOCARDIOGRAM COMPLETE
AR max vel: 2.64 cm2
AV Area VTI: 3.66 cm2
AV Area mean vel: 3 cm2
AV Mean grad: 4 mmHg
AV Peak grad: 7.7 mmHg
Ao pk vel: 1.39 m/s
Area-P 1/2: 2.3 cm2
Height: 70 in
S' Lateral: 2.7 cm
Weight: 3988.8 oz

## 2021-07-05 LAB — HEMOGLOBIN A1C
Hgb A1c MFr Bld: 7.8 % — ABNORMAL HIGH (ref 4.8–5.6)
Hgb A1c MFr Bld: 8 % — ABNORMAL HIGH (ref 4.8–5.6)
Mean Plasma Glucose: 177 mg/dL
Mean Plasma Glucose: 183 mg/dL

## 2021-07-05 LAB — BRAIN NATRIURETIC PEPTIDE: B Natriuretic Peptide: 219.6 pg/mL — ABNORMAL HIGH (ref 0.0–100.0)

## 2021-07-05 LAB — C-REACTIVE PROTEIN: CRP: 21.2 mg/dL — ABNORMAL HIGH (ref <1.0)

## 2021-07-05 LAB — PATHOLOGIST SMEAR REVIEW

## 2021-07-05 IMAGING — MR MR PELVIS W/O CM
4 series · 48 of 48 positions shown · non-contrast
Comparison: None.

CLINICAL DATA: Hip replacement, infection suspected

EXAM:
MRI PELVIS WITHOUT CONTRAST
TECHNIQUE: Multiplanar multisequence MR imaging of the pelvis was performed. No
intravenous contrast was administered.

[Series 3: STIR · coronal · B · 4.0mm · 1.56mm/px · 10 of 30 slices shown (1 of 2)]
[im 1/30]
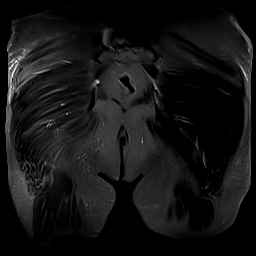
[im 4/30]
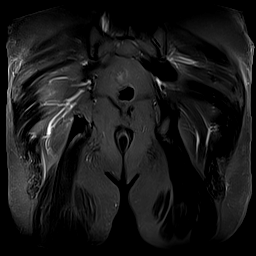
[im 7/30]
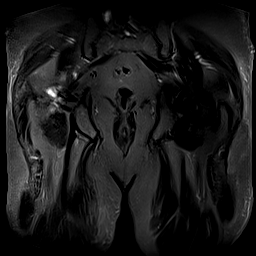
[im 10/30]
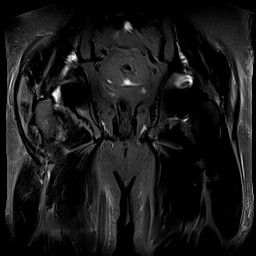
[im 13/30]
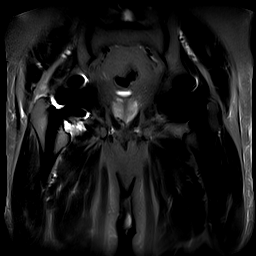
[im 17/30]
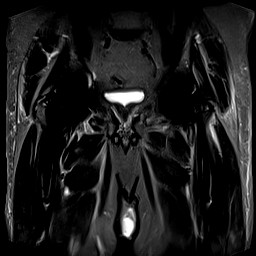
[im 20/30]
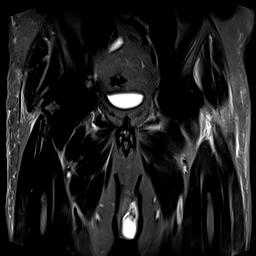
[im 23/30]
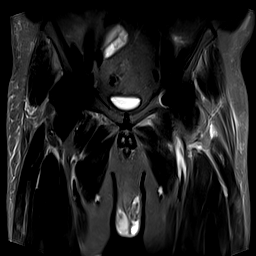
[im 26/30]
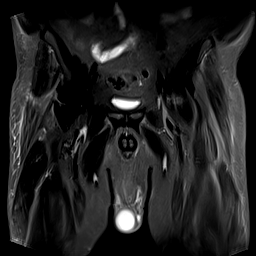
[im 30/30]
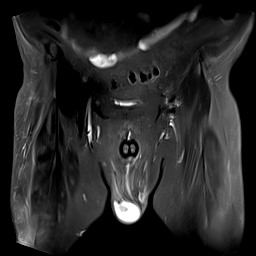

[Series 5: T1 · coronal · B · 4.0mm · 1.56mm/px · 10 of 30 slices shown]
[im 1/30]
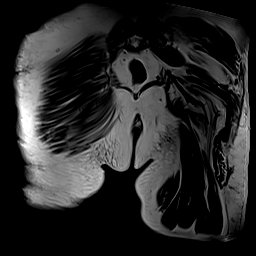
[im 4/30]
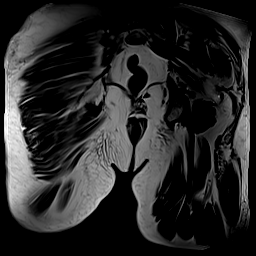
[im 7/30]
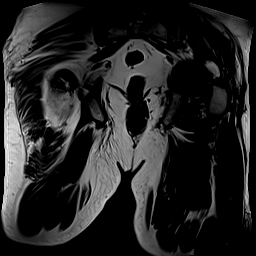
[im 10/30]
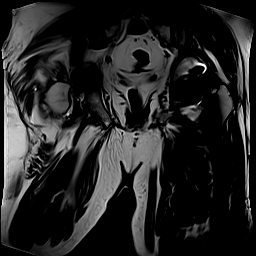
[im 13/30]
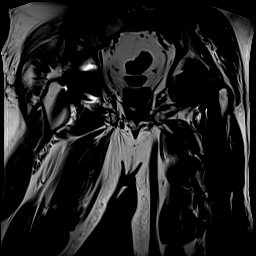
[im 17/30]
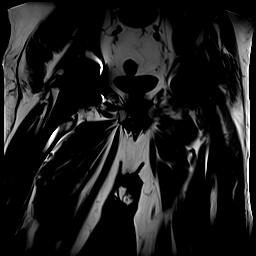
[im 20/30]
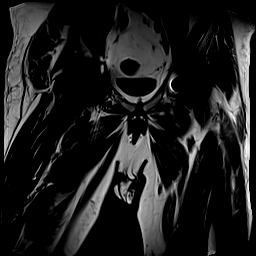
[im 23/30]
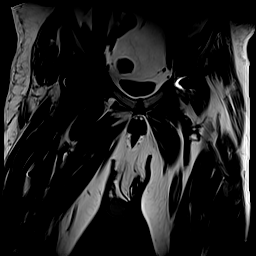
[im 26/30]
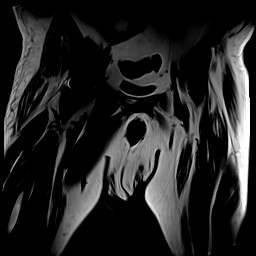
[im 30/30]
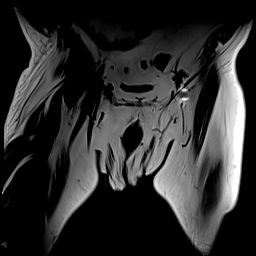

[Series 6: pd_tse_cor_high-bw_bilat · coronal · B · 4.0mm · 1.56mm/px · 10 of 30 slices shown]
[im 1/30]
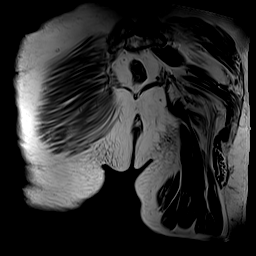
[im 4/30]
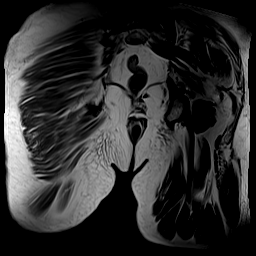
[im 7/30]
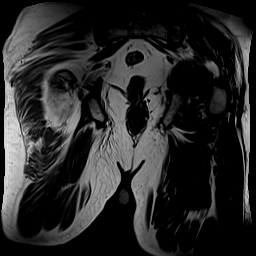
[im 10/30]
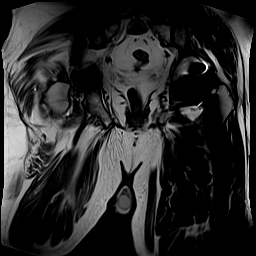
[im 13/30]
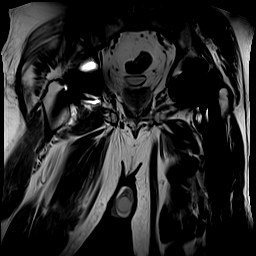
[im 17/30]
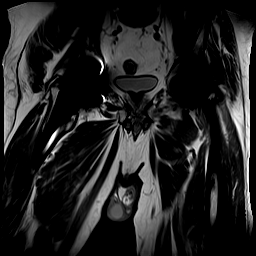
[im 20/30]
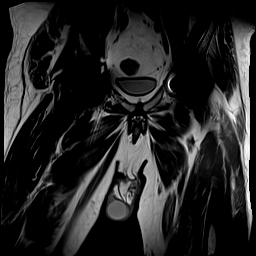
[im 23/30]
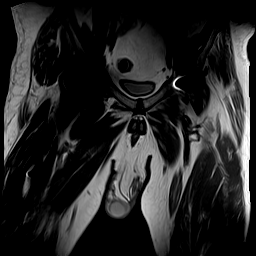
[im 26/30]
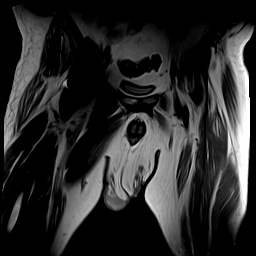
[im 30/30]
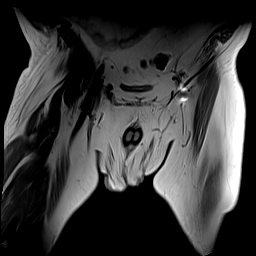

[Series 7: STIR · axial · B · 4.0mm · 1.56mm/px · z∈[-113,+157]mm · 18 of 55 slices shown (2 of 2)]
[im 1/55]
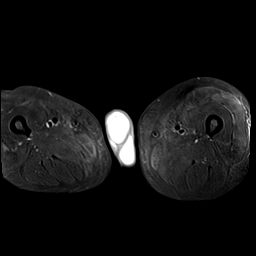
[im 4/55]
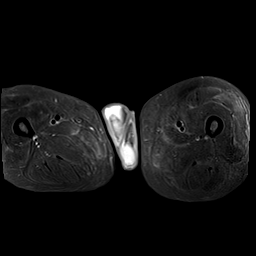
[im 7/55]
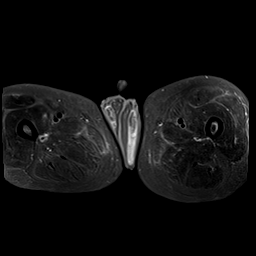
[im 10/55]
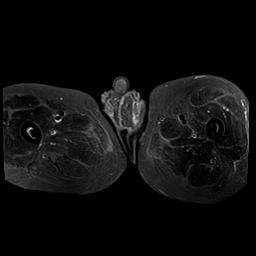
[im 13/55]
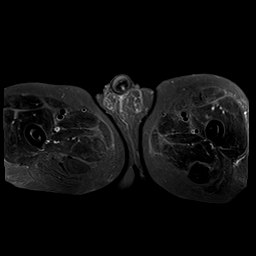
[im 16/55]
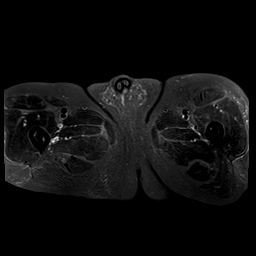
[im 20/55]
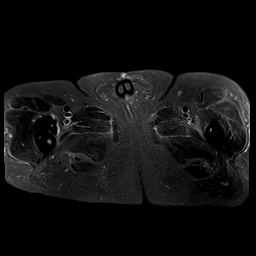
[im 23/55]
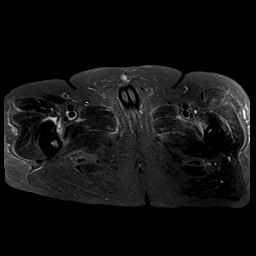
[im 26/55]
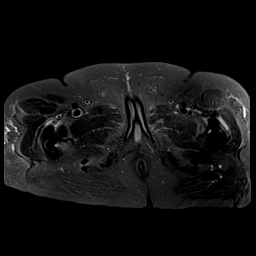
[im 29/55]
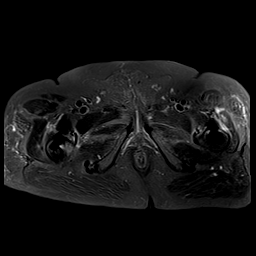
[im 32/55]
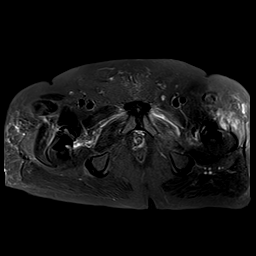
[im 35/55]
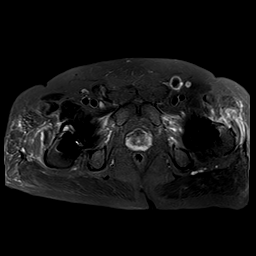
[im 39/55]
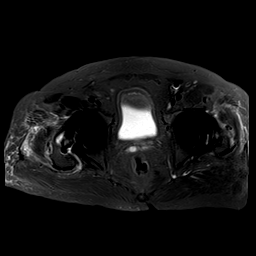
[im 42/55]
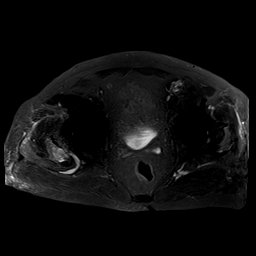
[im 45/55]
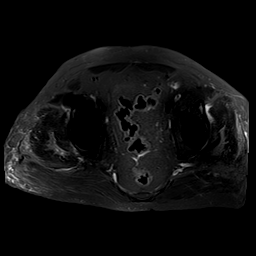
[im 48/55]
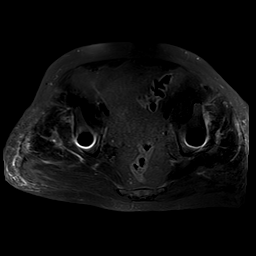
[im 51/55]
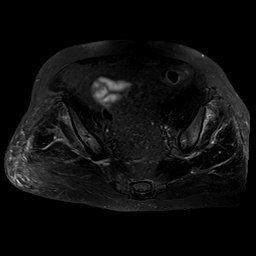
[im 55/55]
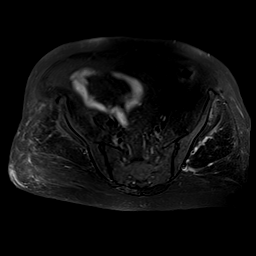

[48 of 48 positions shown; findings below may reference images not displayed]

FINDINGS: Urinary Tract:  No abnormality visualized.

Bowel:  Unremarkable visualized pelvic bowel loops.

Vascular/Lymphatic: No pathologically enlarged lymph nodes. No
significant vascular abnormality seen.

Reproductive:  No mass or other significant abnormality

Other:  None.

Musculoskeletal: There are bilateral hip arthroplasties with
associated susceptibility artifact. There are moderate right and
trace left joint effusions. There is no visible marrow signal
abnormality. Lower lumbar spine degenerative disc disease, partially
visualized. There is intramuscular edema bilaterally involving the
gluteus musculature and abductor muscles.
IMPRESSION: Bilateral hip arthroplasties with associated susceptibility
artifact. Moderate right and trace left joint effusions, with
right-sided periprostatic fluid collection tracking posteriorly
along the greater trochanter. No marrow signal changes to suggest
osteomyelitis. If there is concern for infected hip joint,
aspiration should be considered.

Symmetric bilateral intramuscular edema involving the gluteal
musculature and adductor muscles, consistent with non-specific
myositis. No evidence of pyomyositis.

## 2021-07-05 IMAGING — DX DG CHEST 1V PORT
1 series · 1 of 1 positions shown · non-contrast
Comparison: Radiograph [DATE]

CLINICAL DATA: Weakness and shortness of breath

EXAM:
PORTABLE CHEST 1 VIEW

[chest ap]
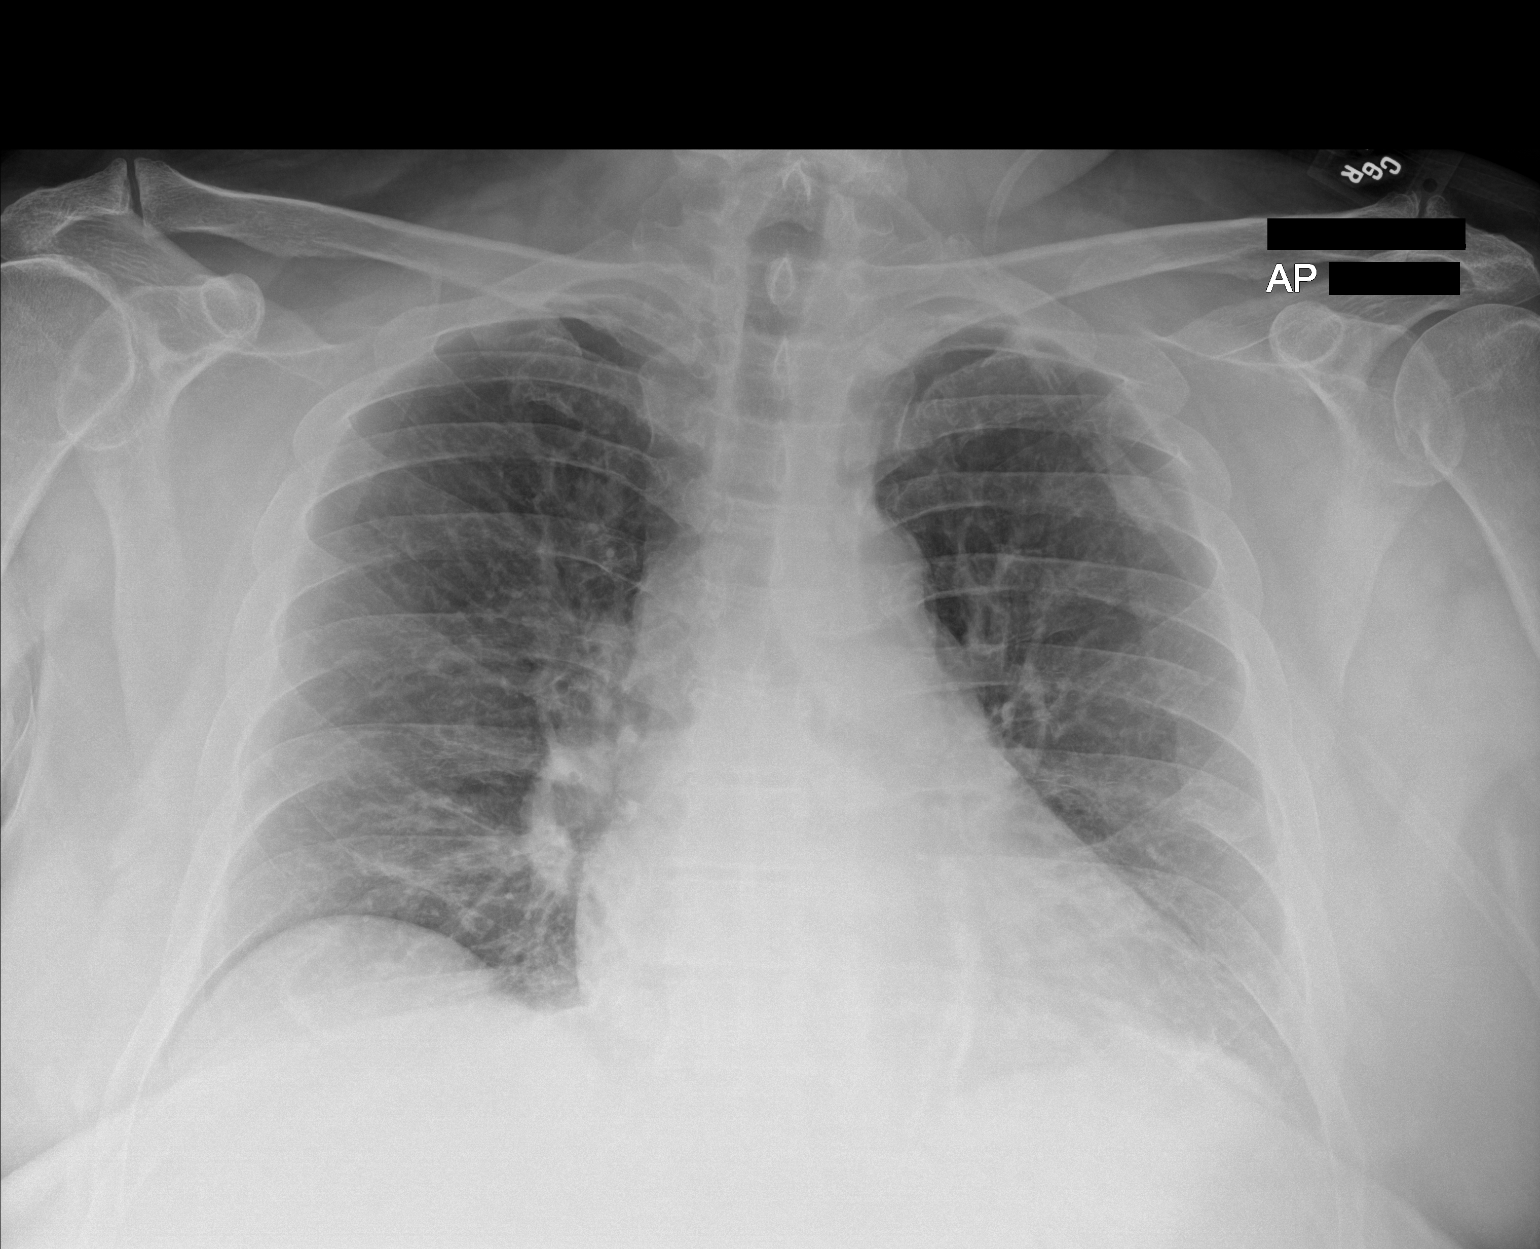

[1 of 1 positions shown; findings below may reference images not displayed]

FINDINGS: Unchanged cardiomediastinal silhouette. Nodular opacities in the
lungs are better seen on recent chest CT. Bibasilar atelectasis. No
large effusion. No pneumothorax. No acute osseous abnormality.
IMPRESSION: Nodular opacities in the lungs are better seen on recent chest CT.
Bibasilar atelectasis.

## 2021-07-05 IMAGING — CT CT ANGIO CHEST
2 of 6 series · 18 of 46 positions shown · IV contrast (APPLIED)
Comparison: None.

CLINICAL DATA: Positive D-dimer level.

EXAM:
CT ANGIOGRAPHY CHEST WITH CONTRAST
TECHNIQUE: Multidetector CT imaging of the chest was performed using the
standard protocol during bolus administration of intravenous
contrast. Multiplanar CT image reconstructions and MIPs were
obtained to evaluate the vascular anatomy.
CONTRAST:  75mL OMNIPAQUE IOHEXOL 350 MG/ML SOLN

[Series 5: thins · axial · 0.72mm/px · z∈[-1000,-733]mm · 16 of 293 slices shown]
[im 13/293  lung]
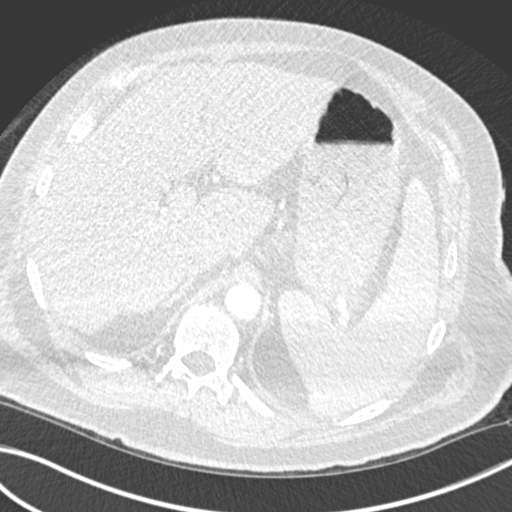
[im 39/293  soft-tissue]
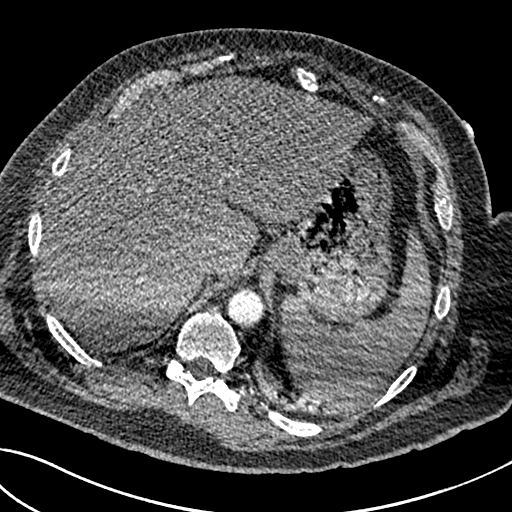
[im 51/293  lung]
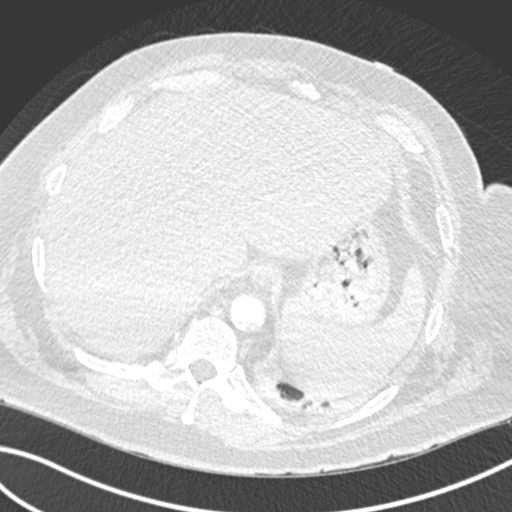
[im 64/293  soft-tissue]
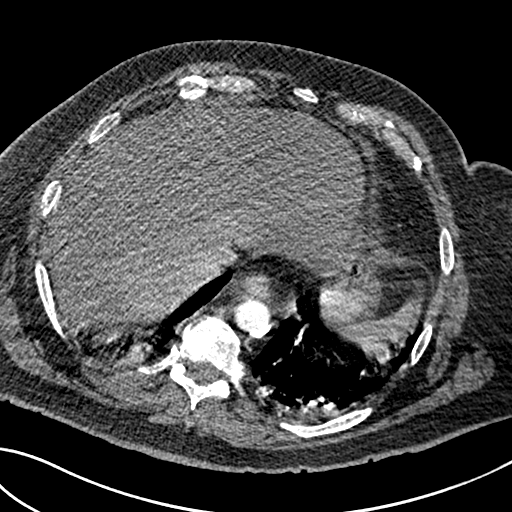
[im 89/293  lung]
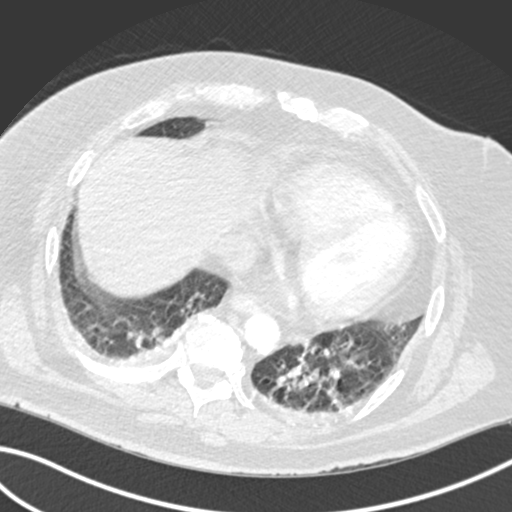
[im 102/293  soft-tissue]
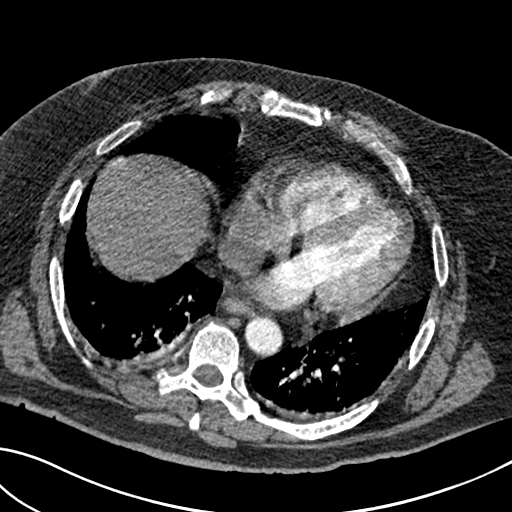
[im 115/293  lung]
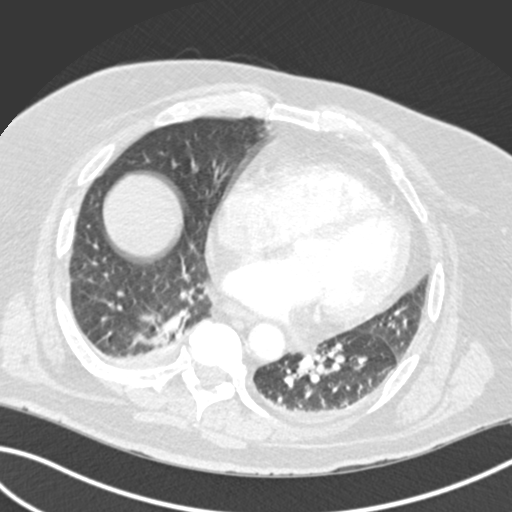
[im 140/293  soft-tissue]
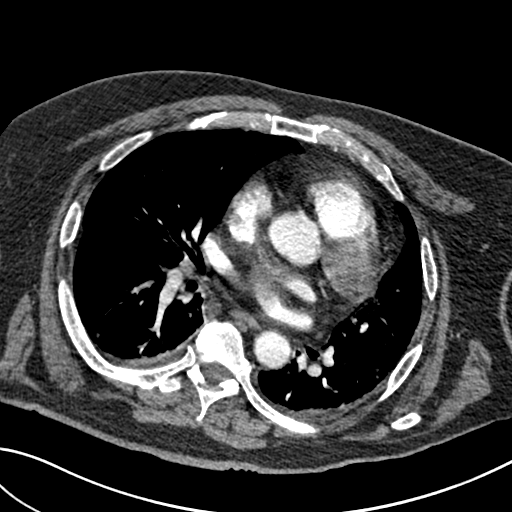
[im 153/293  lung]
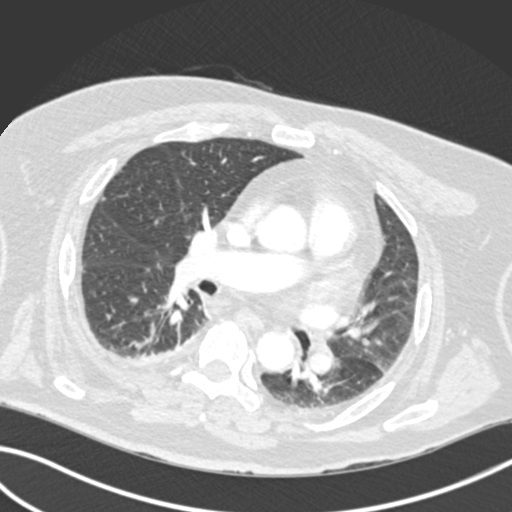
[im 178/293  soft-tissue]
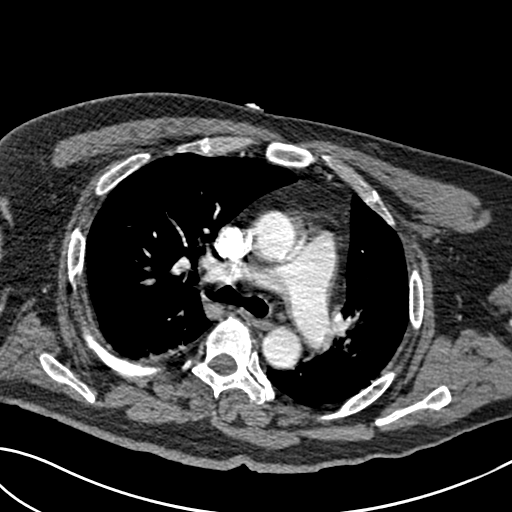
[im 191/293  lung]
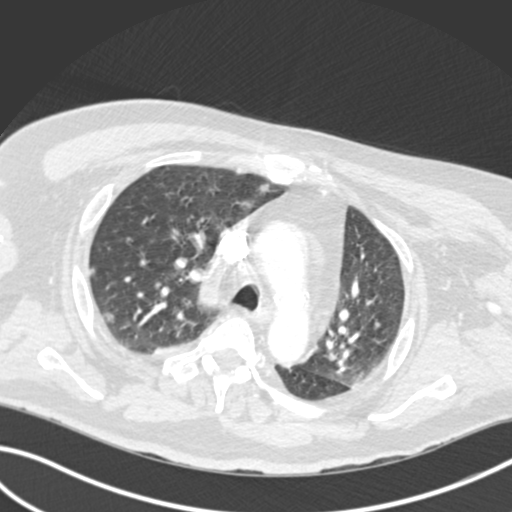
[im 204/293  soft-tissue]
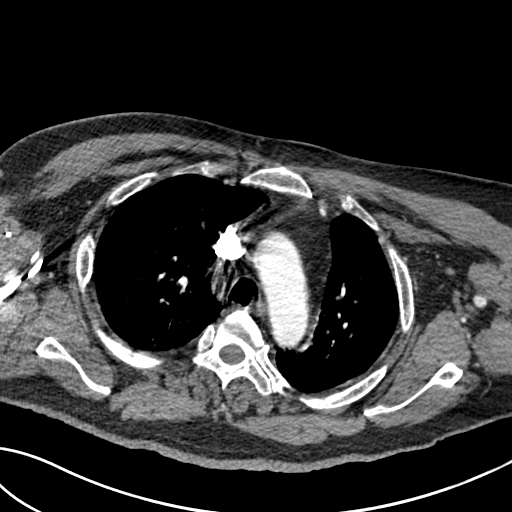
[im 229/293  lung]
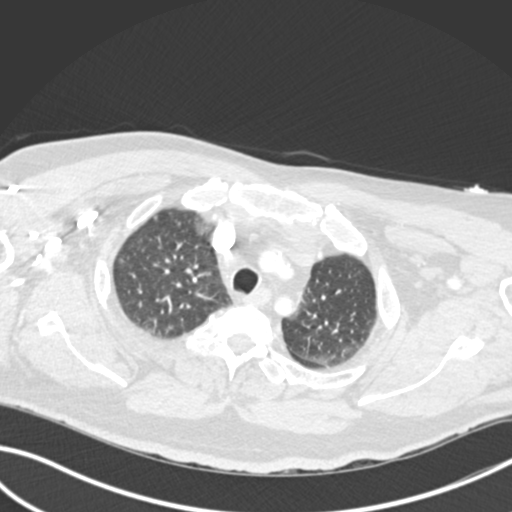
[im 242/293  soft-tissue]
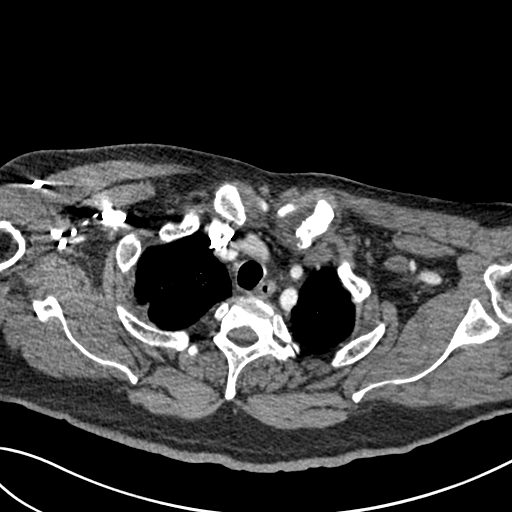
[im 254/293  lung]
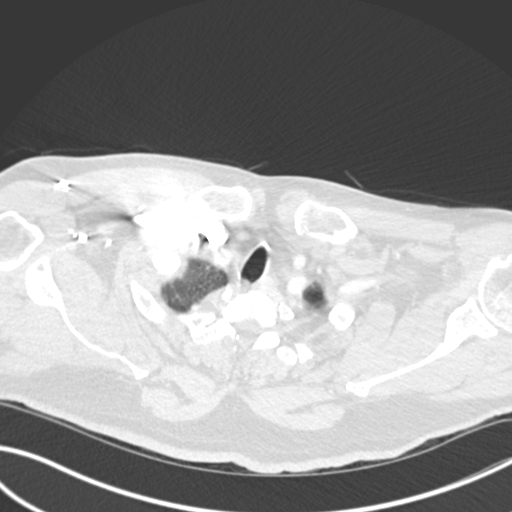
[im 280/293  soft-tissue]
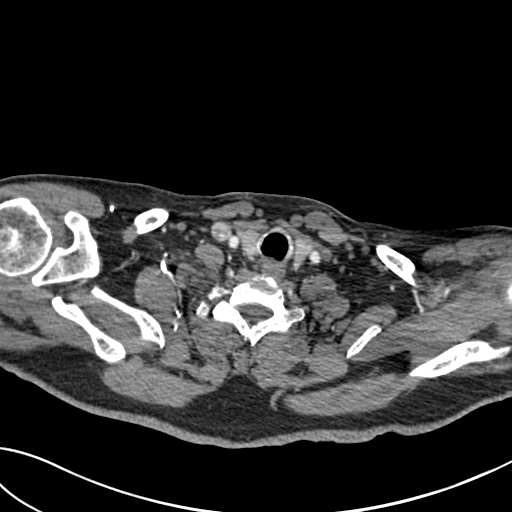

[Series 7: coronal mpr · coronal · 0.57mm/px · 2 of 106 slices shown]
[im 36/106  soft-tissue]
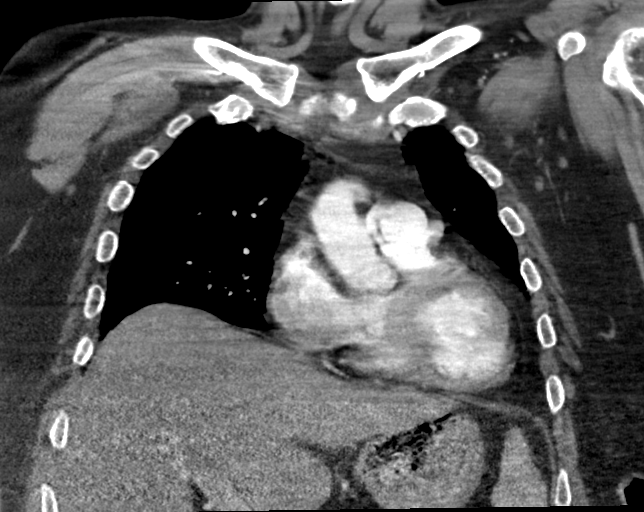
[im 71/106  soft-tissue]
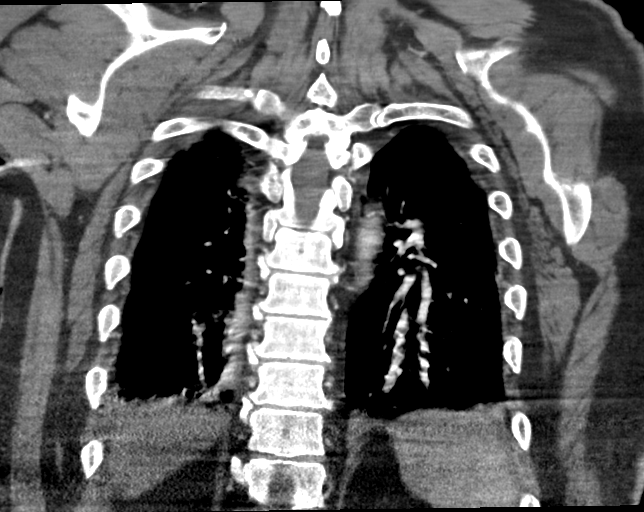

[18 of 46 positions shown; findings below may reference images not displayed]

FINDINGS: Cardiovascular: Satisfactory opacification of the pulmonary arteries
to the segmental level. No evidence of pulmonary embolism. Normal
heart size. No pericardial effusion. Atherosclerosis of thoracic
aorta is noted without aneurysm formation.

Mediastinum/Nodes: No enlarged mediastinal, hilar, or axillary lymph
nodes. Thyroid gland, trachea, and esophagus demonstrate no
significant findings.

Lungs/Pleura: No pneumothorax is noted. Mild bibasilar subsegmental
atelectasis is noted. Multiple ill-defined nodular opacities are
noted throughout both lungs with the largest measuring 13 mm in the
right upper lobe. This is concerning for metastatic disease or
possibly multifocal infection.

Upper Abdomen: No acute abnormality.

Musculoskeletal: No chest wall abnormality. No acute or significant
osseous findings.

Review of the MIP images confirms the above findings.
IMPRESSION: No definite evidence of pulmonary embolus is noted.

Multiple ill-defined nodular opacities are noted throughout both
lungs, the largest measuring 13 mm in the right upper lobe. This is
concerning for diffuse metastatic disease or possibly multifocal
pneumonia. Clinical correlation and short-term follow-up CT scan may
be performed.

Mild bibasilar subsegmental atelectasis is noted.

Aortic Atherosclerosis ([XG]-[XG]).

## 2021-07-05 MED ORDER — INSULIN ASPART 100 UNIT/ML IJ SOLN
6.0000 [IU] | Freq: Three times a day (TID) | INTRAMUSCULAR | Status: DC
  Administered 2021-07-05 – 2021-07-08 (×8): 6 [IU] via SUBCUTANEOUS
  Filled 2021-07-05 (×8): qty 1

## 2021-07-05 MED ORDER — FUROSEMIDE 10 MG/ML IJ SOLN
40.0000 mg | Freq: Once | INTRAMUSCULAR | Status: AC
  Administered 2021-07-05: 40 mg via INTRAVENOUS
  Filled 2021-07-05: qty 4

## 2021-07-05 MED ORDER — GADOBUTROL 1 MMOL/ML IV SOLN: 10.0000 mL | Freq: Once | INTRAVENOUS | Status: DC | PRN

## 2021-07-05 MED ORDER — IOHEXOL 350 MG/ML SOLN
75.0000 mL | Freq: Once | INTRAVENOUS | Status: AC | PRN
  Administered 2021-07-05: 75 mL via INTRAVENOUS

## 2021-07-05 MED ORDER — ALBUMIN HUMAN 25 % IV SOLN
12.5000 g | Freq: Once | INTRAVENOUS | Status: AC
  Administered 2021-07-05: 12.5 g via INTRAVENOUS
  Filled 2021-07-05: qty 50

## 2021-07-05 MED ORDER — INSULIN GLARGINE-YFGN 100 UNIT/ML ~~LOC~~ SOLN
15.0000 [IU] | Freq: Every day | SUBCUTANEOUS | Status: DC
  Administered 2021-07-05: 15 [IU] via SUBCUTANEOUS
  Filled 2021-07-05 (×2): qty 0.15

## 2021-07-05 NOTE — Progress Notes (Signed)
   07/05/21 1025  Assess: MEWS Score  Temp 98.2 F (36.8 C)  BP (!) 145/109  Pulse Rate 91  Resp (!) 32  Level of Consciousness Alert  SpO2 95 %  O2 Device Nasal Cannula  O2 Flow Rate (L/min) 4 L/min  Assess: MEWS Score  MEWS Temp 0  MEWS Systolic 0  MEWS Pulse 0  MEWS RR 2  MEWS LOC 0  MEWS Score 2  MEWS Score Color Yellow  Assess: if the MEWS score is Yellow or Red  Were vital signs taken at a resting state? No  Focused Assessment Change from prior assessment (see assessment flowsheet)  Does the patient meet 2 or more of the SIRS criteria? No  Does the patient have a confirmed or suspected source of infection? No  MEWS guidelines implemented *See Row Information* Yes  Take Vital Signs  Increase Vital Sign Frequency  Yellow: Q 2hr X 2 then Q 4hr X 2, if remains yellow, continue Q 4hrs  Escalate  MEWS: Escalate Yellow: discuss with charge nurse/RN and consider discussing with provider and RRT  Notify: Charge Nurse/RN  Name of Charge Nurse/RN Notified Danae Chen RN  Date Charge Nurse/RN Notified 07/05/21  Time Charge Nurse/RN Notified 1033  Notify: Provider  Provider Name/Title Sreenath  Date Provider Notified 07/05/21  Time Provider Notified 1031  Notification Type Page  Notification Reason Change in status (yellow MEWS)  Assess: SIRS CRITERIA  SIRS Temperature  0  SIRS Pulse 1  SIRS Respirations  1  SIRS WBC 1  SIRS Score Sum  3

## 2021-07-05 NOTE — Progress Notes (Signed)
OT Cancellation Note  Patient Details Name: Joshua Cole MRN: [id number] DOB: [birth year]   Cancelled Treatment:    Reason Eval/Treat Not Completed: Patient declined, no reason specified. This author attempted to see pt at 10:40 this AM, however RN reporting pt with recent desaturation event. Upon second attempt this date at 13:40, pt refusing to participate in OT tx, stating " I just want to rest" and denying any pain. Pt appearing restless and only oriented to self and place during second attempt; RN informed. OT to re-attempt at later time/date as able.   Fredirick Maudlin, OTR/L [place]

## 2021-07-05 NOTE — Progress Notes (Signed)
   07/05/21 1606  Assess: MEWS Score  Temp (!) 101.2 F (38.4 C) (RN Mardene Celeste notified)  BP (!) 87/53 (RN Mardene Celeste notified)  Pulse Rate 88  Resp 20  SpO2 97 %  Assess: MEWS Score  MEWS Temp 1  MEWS Systolic 1  MEWS Pulse 0  MEWS RR 0  MEWS LOC 0  MEWS Score 2  MEWS Score Color Yellow  Assess: if the MEWS score is Yellow or Red  Were vital signs taken at a resting state? Yes  Focused Assessment No change from prior assessment  Does the patient meet 2 or more of the SIRS criteria? No  Does the patient have a confirmed or suspected source of infection? No  MEWS guidelines implemented *See Row Information* Yes  Take Vital Signs  Increase Vital Sign Frequency  Yellow: Q 2hr X 2 then Q 4hr X 2, if remains yellow, continue Q 4hrs  Escalate  MEWS: Escalate Yellow: discuss with charge nurse/RN and consider discussing with provider and RRT  Notify: Charge Nurse/RN  Name of Charge Nurse/RN Notified Danae Chen RN  Date Charge Nurse/RN Notified 07/05/21  Time Charge Nurse/RN Notified 37  Notify: Provider  Provider Name/Title Sreenath  Date Provider Notified 07/05/21  Time Provider Notified 1611  Notification Type Page  Notification Reason Other (Comment) (yellow MEWS)  Assess: SIRS CRITERIA  SIRS Temperature  1  SIRS Pulse 0  SIRS Respirations  0  SIRS WBC 0  SIRS Score Sum  1

## 2021-07-05 NOTE — Consult Note (Signed)
Cardiology Consultation:   Patient ID: Joshua Cole MRN: [id number]; DOB: 1960/12/03  Admit date: 07/03/2021 Date of Consult: 07/05/2021  PCP:  Merryl Hacker, No   [facility] Providers Cardiologist:  None New  Patient Profile:   Joshua Cole is a 59 y.o. male with a hx of gout, alcohol use, and joint pain who is being seen 07/05/2021 for the evaluation of valvular vegetation at the request of Dr. Priscella Mann.  History of Present Illness:   Joshua Cole has not been seen by cardiology in the past. He denies h/o MI or stent. No family h/o CAD. He is a bus Geophysicist/field seismologist. Does no formal activity due to joint pain. Denies drug use. Says he drinks 20 beers a week. Remote smoking history.   The patient came to the ER at [facility] 10/22 for weakness. He received the flu shot and COVID booster on Friday. The next day he started experiencing fever and chills. Also had nausea, vomiting, weakness. No chest pain or sob at that time. When symptoms didn't improve he came into the ED.   In the ER BP 98/61, RR 16, afebrile, 95% O2. Labs showed moderate leukocytosis and hyperglycemia (possible from steroids). Sodium 122. CXR was negative. COVID/flu negative. He was started on IV insulin and IVF and admitted. Blood cultures positive for MSSA. ID following as well. Echo showed possible vegetation and cardiology was consulted.    Past Medical History:  Diagnosis Date   Arthritis    DJD (degenerative joint disease)    Gout     Past Surgical History:  Procedure Laterality Date   JOINT REPLACEMENT       Home Medications:  Prior to Admission medications   Not on File    Inpatient Medications: Scheduled Meds:  insulin aspart  0-20 Units Subcutaneous TID WC   insulin aspart  0-5 Units Subcutaneous QHS   insulin aspart  6 Units Subcutaneous TID WC   insulin glargine-yfgn  15 Units Subcutaneous QHS   ketorolac  15 mg Intravenous Q6H   Continuous Infusions:   ceFAZolin (ANCEF) IV 2 g (07/05/21 1300)    PRN Meds: acetaminophen, gadobutrol, morphine injection, ondansetron (ZOFRAN) IV, oxyCODONE  Allergies:   No Known Allergies  Social History:   Social History   Socioeconomic History   Marital status: Unknown    Spouse name: Not on file   Number of children: Not on file   Years of education: Not on file   Highest education level: Not on file  Occupational History   Not on file  Tobacco Use   Smoking status: Never   Smokeless tobacco: Never  Substance and Sexual Activity   Alcohol use: Yes    Comment: bilateral hip replacement   Drug use: Never   Sexual activity: Not on file  Other Topics Concern   Not on file  Social History Narrative   Not on file   Social Determinants of Health   Financial Resource Strain: Not on file  Food Insecurity: Not on file  Transportation Needs: Not on file  Physical Activity: Not on file  Stress: Not on file  Social Connections: Not on file  Intimate Partner Violence: Not on file    Family History:    Family History  Problem Relation Age of Onset   Psoriasis Sister      ROS:  Please see the history of present illness.   All other ROS reviewed and negative.     Physical Exam/Data:   Vitals:   07/05/21 3419 07/05/21 6222  07/05/21 1025 07/05/21 1251  BP: 134/64 (!) 116/59 (!) 145/109 108/60  Pulse: 64 66 91 99  Resp: 20  (!) 32 (!) 24  Temp: (!) 97.5 F (36.4 C) 97.8 F (36.6 C) 98.2 F (36.8 C) 98.2 F (36.8 C)  TempSrc: Oral Oral Oral Oral  SpO2: 100% 98% 95% 95%  Weight:      Height:        Intake/Output Summary (Last 24 hours) at 07/05/2021 1354 Last data filed at 07/05/2021 1016 Gross per 24 hour  Intake 480 ml  Output 2350 ml  Net -1870 ml   Last 3 Weights 07/03/2021 07/03/2021  Weight (lbs) 249 lb 4.8 oz 232 lb  Weight (kg) 113.082 kg 105.235 kg     Body mass index is 35.77 kg/m.  General:  Well nourished, well developed, in no acute distress HEENT: normal Neck: no JVD Vascular: No carotid bruits;  Distal pulses 2+ bilaterally Cardiac:  normal S1, S2; RRR; + murmur  Lungs:  mild crackles at bases  Abd: soft, nontender, no hepatomegaly  Ext: trace edema Musculoskeletal:  No deformities, BUE and BLE strength normal and equal Skin: warm and dry  Neuro:  CNs 2-12 intact, no focal abnormalities noted Psych:  Normal affect   EKG:  The EKG was personally reviewed and demonstrates:  ST, 109 bpm, nonspecific ST/T wave changes Telemetry:  Telemetry was personally reviewed and demonstrates:  N/A  Relevant CV Studies:  Echo 07/04/21  1. Left ventricular ejection fraction, by estimation, is 55 to 60%. The  left ventricle has normal function. Left ventricular endocardial border  not optimally defined to evaluate regional wall motion. There is mild left  ventricular hypertrophy. Left  ventricular diastolic parameters are indeterminate.   2. Right ventricular systolic function is normal. The right ventricular  size is normal. Tricuspid regurgitation signal is inadequate for assessing  PA pressure.   3. Left atrial size was mildly dilated.   4. Right atrial size was mildly dilated.   5. The mitral valve is normal in structure. No evidence of mitral valve  regurgitation. No evidence of mitral stenosis.   6. The aortic valve is normal in structure. Aortic valve regurgitation is  not visualized. Mild aortic valve sclerosis is present, with no evidence  of aortic valve stenosis.   7. Aortic dilatation noted. There is borderline dilatation of the aortic  root and of the ascending aorta, measuring 38 mm.   8. The tricuspid was not well visualized. However, there is likely a  mobile vergetation noted on the short axis images. Recommend a TEE.   Laboratory Data:  High Sensitivity Troponin:  No results for input(s): TROPONINIHS in the last 720 hours.   Chemistry Recent Labs  Lab 07/03/21 1655 07/03/21 2301 07/04/21 0506 07/05/21 0747  NA 124* 122* 128* 127*  K 3.4* 4.1 4.1 3.7  CL 85* 87*  94* 92*  CO2 27 24 27 27   GLUCOSE 267* 312* 281* 282*  BUN 31* 32* 29* 30*  CREATININE 1.19 0.99 0.91 0.87  CALCIUM 8.1* 7.8* 7.8* 7.8*  MG 2.2  --   --   --   GFRNONAA >60 >60 >60 >60  ANIONGAP 12 11 7 8     No results for input(s): PROT, ALBUMIN, AST, ALT, ALKPHOS, BILITOT in the last 168 hours. Lipids No results for input(s): CHOL, TRIG, HDL, LABVLDL, LDLCALC, CHOLHDL in the last 168 hours.  Hematology Recent Labs  Lab 07/03/21 1655 07/04/21 0506 07/05/21 0747  WBC 13.1* 16.2* 15.9*  RBC 4.60 4.11* 3.68*  HGB 15.1 14.3 12.6*  HCT 41.9 38.7* 34.6*  MCV 91.1 94.2 94.0  MCH 32.8 34.8* 34.2*  MCHC 36.0 37.0* 36.4*  RDW 11.9 12.0 12.2  PLT 67* 59* 71*   Thyroid No results for input(s): TSH, FREET4 in the last 168 hours.  BNP Recent Labs  Lab 07/05/21 0747  BNP 219.6*    DDimer  Recent Labs  Lab 07/04/21 0506  DDIMER 3.78*     Radiology/Studies:  CT Angio Chest Pulmonary Embolism (PE) W or WO Contrast  Result Date: 07/05/2021 CLINICAL DATA:  Positive D-dimer level. EXAM: CT ANGIOGRAPHY CHEST WITH CONTRAST TECHNIQUE: Multidetector CT imaging of the chest was performed using the standard protocol during bolus administration of intravenous contrast. Multiplanar CT image reconstructions and MIPs were obtained to evaluate the vascular anatomy. CONTRAST:  67mL OMNIPAQUE IOHEXOL 350 MG/ML SOLN COMPARISON:  None. FINDINGS: Cardiovascular: Satisfactory opacification of the pulmonary arteries to the segmental level. No evidence of pulmonary embolism. Normal heart size. No pericardial effusion. Atherosclerosis of thoracic aorta is noted without aneurysm formation. Mediastinum/Nodes: No enlarged mediastinal, hilar, or axillary lymph nodes. Thyroid gland, trachea, and esophagus demonstrate no significant findings. Lungs/Pleura: No pneumothorax is noted. Mild bibasilar subsegmental atelectasis is noted. Multiple ill-defined nodular opacities are noted throughout both lungs with the  largest measuring 13 mm in the right upper lobe. This is concerning for metastatic disease or possibly multifocal infection. Upper Abdomen: No acute abnormality. Musculoskeletal: No chest wall abnormality. No acute or significant osseous findings. Review of the MIP images confirms the above findings. IMPRESSION: No definite evidence of pulmonary embolus is noted. Multiple ill-defined nodular opacities are noted throughout both lungs, the largest measuring 13 mm in the right upper lobe. This is concerning for diffuse metastatic disease or possibly multifocal pneumonia. Clinical correlation and short-term follow-up CT scan may be performed. Mild bibasilar subsegmental atelectasis is noted. Aortic Atherosclerosis (ICD10-I70.0). Electronically Signed   By: Marijo Conception M.D.   On: 07/05/2021 11:44   MR BRAIN W WO CONTRAST  Result Date: 07/03/2021 CLINICAL DATA:  Mental status change, unknown cause EXAM: MRI HEAD WITHOUT AND WITH CONTRAST TECHNIQUE: Multiplanar, multiecho pulse sequences of the brain and surrounding structures were obtained without and with intravenous contrast. CONTRAST:  34mL GADAVIST GADOBUTROL 1 MMOL/ML IV SOLN COMPARISON:  None. FINDINGS: Brain: No acute infarction, hemorrhage, hydrocephalus, extra-axial collection, or mass lesion. The ventricles and sulci are within normal limits for age. No abnormal enhancement. No foci of hemosiderin deposition to suggest remote hemorrhage. Vascular: Normal flow voids. Skull and upper cervical spine: Normal marrow signal. Sinuses/Orbits: Small mucous retention cyst in the left maxillary sinus. Otherwise negative. Other: Trace fluid in right mastoid air cells. IMPRESSION: No acute intracranial process. Electronically Signed   By: Merilyn Baba M.D.   On: 07/03/2021 22:45   DG Chest Port 1 View  Result Date: 07/05/2021 CLINICAL DATA:  Weakness and shortness of breath EXAM: PORTABLE CHEST 1 VIEW COMPARISON:  Radiograph 07/03/2021 FINDINGS: Unchanged  cardiomediastinal silhouette. Nodular opacities in the lungs are better seen on recent chest CT. Bibasilar atelectasis. No large effusion. No pneumothorax. No acute osseous abnormality. IMPRESSION: Nodular opacities in the lungs are better seen on recent chest CT. Bibasilar atelectasis. Electronically Signed   By: Maurine Simmering M.D.   On: 07/05/2021 13:45   DG Chest Portable 1 View  Result Date: 07/03/2021 CLINICAL DATA:  sob/cough EXAM: PORTABLE CHEST 1 VIEW COMPARISON:  None. FINDINGS: The cardiomediastinal silhouette is mildly enlarged in  contour. No pleural effusion. No pneumothorax. No acute pleuroparenchymal abnormality. Visualized abdomen is unremarkable. IMPRESSION: No acute cardiopulmonary abnormality. Electronically Signed   By: Valentino Saxon M.D.   On: 07/03/2021 12:28   ECHOCARDIOGRAM COMPLETE  Result Date: 07/05/2021    ECHOCARDIOGRAM REPORT   Patient Name:   Joshua Cole Date of Exam: 07/04/2021 Medical Rec #:  [id number]        Height:       70.0 in Accession #:    [id number]       Weight:       249.3 lb Date of Birth:  07/19/1961       BSA:          2.292 m Patient Age:    59 years         BP:           107/62 mmHg Patient Gender: M                HR:           70 bpm. Exam Location:  [facility] Procedure: 2D Echo, Cardiac Doppler and Color Doppler Indications:     Bacteremia R78.81  History:         Patient has no prior history of Echocardiogram examinations. No                  heart history listed in medical file.  Sonographer:     Sherrie Sport Referring Phys:  [id number] Sidney Ace Diagnosing Phys: Kathlyn Sacramento MD  Sonographer Comments: Technically challenging study due to limited acoustic windows, suboptimal apical window and suboptimal parasternal window. IMPRESSIONS  1. Left ventricular ejection fraction, by estimation, is 55 to 60%. The left ventricle has normal function. Left ventricular endocardial border not optimally defined to evaluate regional wall motion. There is mild  left ventricular hypertrophy. Left ventricular diastolic parameters are indeterminate.  2. Right ventricular systolic function is normal. The right ventricular size is normal. Tricuspid regurgitation signal is inadequate for assessing PA pressure.  3. Left atrial size was mildly dilated.  4. Right atrial size was mildly dilated.  5. The mitral valve is normal in structure. No evidence of mitral valve regurgitation. No evidence of mitral stenosis.  6. The aortic valve is normal in structure. Aortic valve regurgitation is not visualized. Mild aortic valve sclerosis is present, with no evidence of aortic valve stenosis.  7. Aortic dilatation noted. There is borderline dilatation of the aortic root and of the ascending aorta, measuring 38 mm.  8. The tricuspid was not well visualized. However, there is likely a mobile vergetation noted on the short axis images. Recommend a TEE. FINDINGS  Left Ventricle: Left ventricular ejection fraction, by estimation, is 55 to 60%. The left ventricle has normal function. Left ventricular endocardial border not optimally defined to evaluate regional wall motion. The left ventricular internal cavity size was normal in size. There is mild left ventricular hypertrophy. Left ventricular diastolic parameters are indeterminate. Right Ventricle: The right ventricular size is normal. No increase in right ventricular wall thickness. Right ventricular systolic function is normal. Tricuspid regurgitation signal is inadequate for assessing PA pressure. Left Atrium: Left atrial size was mildly dilated. Right Atrium: Right atrial size was mildly dilated. Pericardium: There is no evidence of pericardial effusion. Mitral Valve: The mitral valve is normal in structure. There is mild calcification of the mitral valve leaflet(s). No evidence of mitral valve regurgitation. No evidence of mitral valve stenosis. Tricuspid Valve: The tricuspid valve  is not well visualized. Tricuspid valve regurgitation is  not demonstrated. No evidence of tricuspid stenosis. Aortic Valve: The aortic valve is normal in structure. Aortic valve regurgitation is not visualized. Mild aortic valve sclerosis is present, with no evidence of aortic valve stenosis. Aortic valve mean gradient measures 4.0 mmHg. Aortic valve peak gradient measures 7.7 mmHg. Aortic valve area, by VTI measures 3.66 cm. Pulmonic Valve: The pulmonic valve was normal in structure. Pulmonic valve regurgitation is not visualized. No evidence of pulmonic stenosis. Aorta: Aortic dilatation noted. There is borderline dilatation of the aortic root and of the ascending aorta, measuring 38 mm. Venous: The inferior vena cava was not well visualized. IAS/Shunts: No atrial level shunt detected by color flow Doppler.  LEFT VENTRICLE PLAX 2D LVIDd:         3.70 cm   Diastology LVIDs:         2.70 cm   LV e' medial:    5.55 cm/s LV PW:         1.30 cm   LV E/e' medial:  14.4 LV IVS:        0.95 cm   LV e' lateral:   12.50 cm/s LVOT diam:     2.20 cm   LV E/e' lateral: 6.4 LV SV:         67 LV SV Index:   29 LVOT Area:     3.80 cm  RIGHT VENTRICLE RV Basal diam:  3.70 cm LEFT ATRIUM           Index        RIGHT ATRIUM           Index LA diam:      4.30 cm 1.88 cm/m   RA Area:     21.80 cm LA Vol (A4C): 69.6 ml 30.36 ml/m  RA Volume:   77.30 ml  33.72 ml/m  AORTIC VALVE                    PULMONIC VALVE AV Area (Vmax):    2.64 cm     PV Vmax:        0.81 m/s AV Area (Vmean):   3.00 cm     PV Peak grad:   2.7 mmHg AV Area (VTI):     3.66 cm     RVOT Peak grad: 4 mmHg AV Vmax:           139.00 cm/s AV Vmean:          81.200 cm/s AV VTI:            0.182 m AV Peak Grad:      7.7 mmHg AV Mean Grad:      4.0 mmHg LVOT Vmax:         96.60 cm/s LVOT Vmean:        64.000 cm/s LVOT VTI:          0.175 m LVOT/AV VTI ratio: 0.96  AORTA Ao Root diam: 3.73 cm MITRAL VALVE               TRICUSPID VALVE MV Area (PHT): 2.30 cm    TR Peak grad:   9.4 mmHg MV Decel Time: 330 msec    TR Vmax:         153.00 cm/s MV E velocity: 79.70 cm/s MV A velocity: 90.00 cm/s  SHUNTS MV E/A ratio:  0.89        Systemic VTI:  0.18 m  Systemic Diam: 2.20 cm Kathlyn Sacramento MD Electronically signed by Kathlyn Sacramento MD Signature Date/Time: 07/05/2021/12:50:50 PM    Final      Assessment and Plan:   Valvular vegetation SEPSIS/bacteremia AMS - presented with fever and chills found to have leukocytosis and tachycardia - IV abx per IM/ ID following - Blood cultures showed MSSA bacteremia - mental status improving. He is A&Ox3 on my exam - Patient became SOB this AM and IVF stopped.  - CTA chest negative for PE, but showed possible septic emboli - Echo showed LVEF 55-60%, mild LVH, mild aortic sclerosis, aortic dilation 69mm, possible vegetation on tricuspid valve - Needs TEE. Patient unwilling to consent for TEE at this time. MD to see  Respiratory failure - CTA chest negative for PE concerning for multiple ill-defined lesions, possible septic emboli - He has remote smoking history - IVF held  - BNP 219 - echo showed LVEF 55-60%  Hyponatremia - suspected secondary to alcohol use and poor oral intake - NA 127 today - per IM  Thrombocytopenia - suspected secondary to alcohol use  Alcohol use - cessation recommended  For questions or updates, please contact [facility] Please consult [email or web address] for contact info under    Signed, Samayah Novinger Ninfa Meeker, PA-C  07/05/2021 1:54 PM

## 2021-07-05 NOTE — Progress Notes (Signed)
PROGRESS NOTE    Joshua Cole  MOQ:[id number] DOB: 05-24-1961 DOA: 07/03/2021 PCP: Pcp, No    Brief Narrative:  60 y.o. male with medical history significant of gout, alcohol use, who presents with generalized weakness, multiple joints pain, confusion, nausea, vomiting, diarrhea, fever.   Per his fiance (I called her fianc by phone), patient received flu shot and COVID-vaccine booster on Friday.  In Saturday night, patient had fever of 102 and chills.  He complains of multiple joint pain, including right hand, left shoulder, bilateral hip and lower back.  He has nausea, vomiting and few times of watery diarrhea, which has resolved per patient.  Patient has mild dry cough, mild shortness of breath, denies chest pain. Patient was found to be intermittently confused.  When saw patient in the ED, patient is mildly confused, but is still oriented x3.  He moves all extremities.  No facial droop or slurred speech.  Denies symptoms of UTI.  Her fianc states that patient was seen in urgent care and was tested negative for flu and COVID, but started him on Tamiflu and steroid on 10/20.  Blood cultures positive for MSSA.  Pharmacy contacted attending provider who added IV cefazolin.  Infectious disease on consult.  Patient remains hemodynamically stable.  Mental status improving.  Desaturation event on 10/24.  Follow-up CT angiography negative for PE.  Is concerning for multiple ill-defined lesions, suspect septic emboli.  Patient also endorsing pain in bilateral hips.   Assessment & Plan:   Principal Problem:   Hyponatremia Active Problems:   Acute metabolic encephalopathy   Joint pain   Generalized weakness   SIRS (systemic inflammatory response syndrome) (HCC)   Hypokalemia   Hyperglycemia   Thrombocytopenia (HCC)   Nausea vomiting and diarrhea   Gout  MSSA bacteremia Sepsis secondary to above Sepsis criteria met with leukocytosis and tachycardia Source of bacteremia is  unclear Patient was started on Ancef Sepsis physiology improving Plan: Continue Ancef IV fluids stopped Surveillance blood cultures 10/24 Infectious disease follow-up  Acute hypoxic respiratory failure Patient had desaturation event on 10/24 Oxygen saturation on room air mid 80s Recovered to mid 90s with 4 L nasal cannula CT angiography negative for PE Concerning for multiple ill-defined lesions, possible septic emboli Fluid overload on differential Plan: Stop IV fluids Lasix 40 mg IV x1 Check echocardiogram Cardiology consult, consideration for TEE  Acute metabolic encephalopathy Etiology not entirely clear TTP ruled out after oncology reviewed peripheral smear MRI reassuring Low suspicion for intracranial thrombosis related to COVID-vaccine Mental status seems to be improving Suspect related to bacteremia Plan: Frequent neurochecks  New diagnosis diabetes mellitus with hyperglycemia Hemoglobin A1c 7.8 Patient does not see physicians and is not on any diabetic medication Plan: Basal bolus regimen with Semglee and NovoLog Carb modified diet Diabetes coordinator consult  Diffuse joint pain Generalized weakness Possible exacerbation of gout Would be atypical presentation Has been on steroids since 10/20 Plan: Steroids stopped Continue Toradol 15 every 6 As needed narcotics for breakthrough pain Therapy evaluations as able Check MRI hip/pelvis rule out seeding of joint  Thrombocytopenia Platelets and 60s, no active bleeding Suspect secondary to alcohol intake TTP unlikely due to lack of schistocytes on peripheral smear Appreciate oncology follow-up  Hyponatremia Likely secondary to alcohol abuse and poor oral intake IV.  Hypokalemia Monitor and replace potassium as necessary  Hyperglycemia Likely related to recent steroid use  Intractable nausea vomiting Diarrhea Resolved    DVT prophylaxis: SCD Code Status: Full Family Communication: Garen Grams  [phone].  At bedside 10/24 Disposition Plan: Status is: Inpatient  Remains inpatient appropriate because: Sepsis, MSSA bacteremia, acute metabolic encephalopathy.  Disposition plan pending.  Not medically ready for discharge at this time.       Level of care: Med-Surg  Consultants:  ID Heme-onc  Procedures:  None  Antimicrobials: Cefazolin   Subjective: Seen and examined.  Remains alert and oriented x3.  Mental status improving.  Continues to endorse severe weakness and joint pain  Objective: Vitals:   07/04/21 1924 07/05/21 0444 07/05/21 0754 07/05/21 1025  BP: (!) 117/58 134/64 (!) 116/59 (!) 145/109  Pulse: 64 64 66 91  Resp: 20 20  (!) 32  Temp: 98.6 F (37 C) (!) 97.5 F (36.4 C) 97.8 F (36.6 C) 98.2 F (36.8 C)  TempSrc: Oral Oral Oral Oral  SpO2: 94% 100% 98% 95%  Weight:      Height:        Intake/Output Summary (Last 24 hours) at 07/05/2021 1242 Last data filed at 07/05/2021 1016 Gross per 24 hour  Intake 480 ml  Output 2350 ml  Net -1870 ml   Filed Weights   07/03/21 0954 07/03/21 1145  Weight: 105.2 kg 113.1 kg    Examination:  General exam: No acute distress.  Appears fatigued Respiratory system: Bibasilar crackles.  Normal work of breathing.  4 L Cardiovascular system: S1-S2, RRR, no murmurs, no pedal edema Gastrointestinal system: Soft, NT/ND, normal bowel sounds Central nervous system: Alert and oriented. No focal neurological deficits.  Lethargic Extremities: Decreased range of motion and power upper extremities.  Greater on right.  Gait not assessed.  Decreased power bilateral lower extremities Skin: No rashes, lesions or ulcers Psychiatry: Judgement and insight appear impaired. Mood & affect flattened.     Data Reviewed: I have personally reviewed following labs and imaging studies  CBC: Recent Labs  Lab 07/03/21 0959 07/03/21 1655 07/04/21 0506 07/05/21 0747  WBC 17.4* 13.1* 16.2* 15.9*  NEUTROABS  --   11.5*  --  13.3*  HGB 14.8 15.1 14.3 12.6*  HCT 39.5 41.9 38.7* 34.6*  MCV 88.6 91.1 94.2 94.0  PLT 68* 67* 59* 71*   Basic Metabolic Panel: Recent Labs  Lab 07/03/21 0959 07/03/21 1655 07/03/21 2301 07/04/21 0506 07/05/21 0747  NA 122* 124* 122* 128* 127*  K 3.4* 3.4* 4.1 4.1 3.7  CL 83* 85* 87* 94* 92*  CO2 '26 27 24 27 27  ' GLUCOSE 337* 267* 312* 281* 282*  BUN 29* 31* 32* 29* 30*  CREATININE 1.13 1.19 0.99 0.91 0.87  CALCIUM 8.3* 8.1* 7.8* 7.8* 7.8*  MG  --  2.2  --   --   --   PHOS  --  3.4  --   --   --    GFR: Estimated Creatinine Clearance: 116.5 mL/min (by C-G formula based on SCr of 0.87 mg/dL). Liver Function Tests: No results for input(s): AST, ALT, ALKPHOS, BILITOT, PROT, ALBUMIN in the last 168 hours. No results for input(s): LIPASE, AMYLASE in the last 168 hours. No results for input(s): AMMONIA in the last 168 hours. Coagulation Profile: Recent Labs  Lab 07/03/21 1655 07/04/21 0506  INR 1.1 1.2   Cardiac Enzymes: No results for input(s): CKTOTAL, CKMB, CKMBINDEX, TROPONINI in the last 168 hours. BNP (last 3 results) No results for input(s): PROBNP in the last 8760 hours. HbA1C: Recent Labs    07/03/21 1655  HGBA1C 7.8*   CBG: Recent Labs  Lab 07/03/21 2050 07/04/21 1302 07/04/21 1710  07/04/21 2053 07/05/21 0752  GLUCAP 261* 381* 481* 387* 286*   Lipid Profile: No results for input(s): CHOL, HDL, LDLCALC, TRIG, CHOLHDL, LDLDIRECT in the last 72 hours. Thyroid Function Tests: No results for input(s): TSH, T4TOTAL, FREET4, T3FREE, THYROIDAB in the last 72 hours. Anemia Panel: No results for input(s): VITAMINB12, FOLATE, FERRITIN, TIBC, IRON, RETICCTPCT in the last 72 hours. Sepsis Labs: No results for input(s): PROCALCITON, LATICACIDVEN in the last 168 hours.  Recent Results (from the past 240 hour(s))  Resp Panel by RT-PCR (Flu A&B, Covid) Nasopharyngeal Swab     Status: None   Collection Time: 07/03/21 12:08 PM   Specimen:  Nasopharyngeal Swab; Nasopharyngeal(NP) swabs in vial transport medium  Result Value Ref Range Status   SARS Coronavirus 2 by RT PCR NEGATIVE NEGATIVE Final    Comment: (NOTE) SARS-CoV-2 target nucleic acids are NOT DETECTED.  The SARS-CoV-2 RNA is generally detectable in upper respiratory specimens during the acute phase of infection. The lowest concentration of SARS-CoV-2 viral copies this assay can detect is 138 copies/mL. A negative result does not preclude SARS-Cov-2 infection and should not be used as the sole basis for treatment or other patient management decisions. A negative result may occur with  improper specimen collection/handling, submission of specimen other than nasopharyngeal swab, presence of viral mutation(s) within the areas targeted by this assay, and inadequate number of viral copies(<138 copies/mL). A negative result must be combined with clinical observations, patient history, and epidemiological information. The expected result is Negative.  Fact Sheet for Patients:  EntrepreneurPulse.com.au  Fact Sheet for Healthcare Providers:  IncredibleEmployment.be  This test is no t yet approved or cleared by the [place] FDA and  has been authorized for detection and/or diagnosis of SARS-CoV-2 by FDA under an Emergency Use Authorization (EUA). This EUA will remain  in effect (meaning this test can be used) for the duration of the COVID-19 declaration under Section 564(b)(1) of the Act, 21 U.S.C.section 360bbb-3(b)(1), unless the authorization is terminated  or revoked sooner.       Influenza A by PCR NEGATIVE NEGATIVE Final   Influenza B by PCR NEGATIVE NEGATIVE Final    Comment: (NOTE) The Xpert Xpress SARS-CoV-2/FLU/RSV plus assay is intended as an aid in the diagnosis of influenza from Nasopharyngeal swab specimens and should not be used as a sole basis for treatment. Nasal washings and aspirates are unacceptable for  Xpert Xpress SARS-CoV-2/FLU/RSV testing.  Fact Sheet for Patients: EntrepreneurPulse.com.au  Fact Sheet for Healthcare Providers: IncredibleEmployment.be  This test is not yet approved or cleared by the [place] FDA and has been authorized for detection and/or diagnosis of SARS-CoV-2 by FDA under an Emergency Use Authorization (EUA). This EUA will remain in effect (meaning this test can be used) for the duration of the COVID-19 declaration under Section 564(b)(1) of the Act, 21 U.S.C. section 360bbb-3(b)(1), unless the authorization is terminated or revoked.  Performed at [facility], [address], [place], [place] [address]   Urine Culture     Status: Abnormal   Collection Time: 07/03/21  3:09 PM   Specimen: Urine, Random  Result Value Ref Range Status   Specimen Description   Final    URINE, RANDOM Performed at [facility], [address], [place], [place] [address]    Special Requests   Final    NONE Performed at [facility], [address] [place], [place] [address]    Culture MULTIPLE SPECIES PRESENT, SUGGEST RECOLLECTION (A)  Final  Report Status 07/05/2021 FINAL  Final  Culture, blood (x 2)     Status: Abnormal (Preliminary result)   Collection Time: 07/03/21  4:55 PM   Specimen: BLOOD  Result Value Ref Range Status   Specimen Description   Final    BLOOD LEFT ANTECUBITAL Performed at [facility], [address] [place], [place] [address]    Special Requests   Final    BOTTLES DRAWN AEROBIC AND ANAEROBIC Blood Culture results may not be optimal due to an excessive volume of blood received in culture bottles Performed at [facility], [address] [place], [place] [address]    Culture  Setup Time   Final    IN BOTH AEROBIC AND ANAEROBIC BOTTLES GRAM POSITIVE COCCI CRITICAL RESULT CALLED TO, READ BACK BY AND VERIFIED WITH: NATHAN BELUE AT 9390  07/04/21.PMF Performed at [facility], [place], [place], [place] [address]    Culture STAPHYLOCOCCUS AUREUS (A)  Final   Report Status PENDING  Incomplete  Blood Culture ID Panel (Reflexed)     Status: Abnormal   Collection Time: 07/03/21  4:55 PM  Result Value Ref Range Status   Enterococcus faecalis NOT DETECTED NOT DETECTED Final   Enterococcus Faecium NOT DETECTED NOT DETECTED Final   Listeria monocytogenes NOT DETECTED NOT DETECTED Final   Staphylococcus species DETECTED (A) NOT DETECTED Final    Comment: CRITICAL RESULT CALLED TO, READ BACK BY AND VERIFIED WITH: NATHAN BELUE AT 3300 07/04/21.PMF    Staphylococcus aureus (BCID) DETECTED (A) NOT DETECTED Final    Comment: CRITICAL RESULT CALLED TO, READ BACK BY AND VERIFIED WITH: NATHAN BELUE AT 7622 07/04/21.PMF    Staphylococcus epidermidis NOT DETECTED NOT DETECTED Final   Staphylococcus lugdunensis NOT DETECTED NOT DETECTED Final   Streptococcus species NOT DETECTED NOT DETECTED Final   Streptococcus agalactiae NOT DETECTED NOT DETECTED Final   Streptococcus pneumoniae NOT DETECTED NOT DETECTED Final   Streptococcus pyogenes NOT DETECTED NOT DETECTED Final   A.calcoaceticus-baumannii NOT DETECTED NOT DETECTED Final   Bacteroides fragilis NOT DETECTED NOT DETECTED Final   Enterobacterales NOT DETECTED NOT DETECTED Final   Enterobacter cloacae complex NOT DETECTED NOT DETECTED Final   Escherichia coli NOT DETECTED NOT DETECTED Final   Klebsiella aerogenes NOT DETECTED NOT DETECTED Final   Klebsiella oxytoca NOT DETECTED NOT DETECTED Final   Klebsiella pneumoniae NOT DETECTED NOT DETECTED Final   Proteus species NOT DETECTED NOT DETECTED Final   Salmonella species NOT DETECTED NOT DETECTED Final   Serratia marcescens NOT DETECTED NOT DETECTED Final   Haemophilus influenzae NOT DETECTED NOT DETECTED Final   Neisseria meningitidis NOT DETECTED NOT DETECTED Final   Pseudomonas aeruginosa NOT DETECTED NOT DETECTED  Final   Stenotrophomonas maltophilia NOT DETECTED NOT DETECTED Final   Candida albicans NOT DETECTED NOT DETECTED Final   Candida auris NOT DETECTED NOT DETECTED Final   Candida glabrata NOT DETECTED NOT DETECTED Final   Candida krusei NOT DETECTED NOT DETECTED Final   Candida parapsilosis NOT DETECTED NOT DETECTED Final   Candida tropicalis NOT DETECTED NOT DETECTED Final   Cryptococcus neoformans/gattii NOT DETECTED NOT DETECTED Final   Meth resistant mecA/C and MREJ NOT DETECTED NOT DETECTED Final    Comment: Performed at [facility], [address] [place], [place] [address]  Culture, blood (x 2)     Status: Abnormal (Preliminary result)   Collection Time: 07/03/21  6:37 PM   Specimen: BLOOD  Result Value Ref Range Status  Specimen Description   Final    BLOOD RIGHT HAND Performed at [facility], [address] [place], [place] [address]    Special Requests   Final    BOTTLES DRAWN AEROBIC AND ANAEROBIC Blood Culture adequate volume Performed at [facility], [address] [place], [place] [address]    Culture  Setup Time   Final    GRAM POSITIVE COCCI IN BOTH AEROBIC AND ANAEROBIC BOTTLES CRITICAL RESULT CALLED TO, READ BACK BY AND VERIFIED WITH: NATHAN BELUE AT 2297 07/04/21.PMF Performed at [facility], [address] [place], [place] [address]    Culture STAPHYLOCOCCUS AUREUS (A)  Final   Report Status PENDING  Incomplete         Radiology Studies: CT Angio Chest Pulmonary Embolism (PE) W or WO Contrast  Result Date: 07/05/2021 CLINICAL DATA:  Positive D-dimer level. EXAM: CT ANGIOGRAPHY CHEST WITH CONTRAST TECHNIQUE: Multidetector CT imaging of the chest was performed using the standard protocol during bolus administration of intravenous contrast. Multiplanar CT image reconstructions and MIPs were obtained to evaluate the vascular anatomy. CONTRAST:  60m OMNIPAQUE IOHEXOL 350 MG/ML SOLN COMPARISON:  None. FINDINGS:  Cardiovascular: Satisfactory opacification of the pulmonary arteries to the segmental level. No evidence of pulmonary embolism. Normal heart size. No pericardial effusion. Atherosclerosis of thoracic aorta is noted without aneurysm formation. Mediastinum/Nodes: No enlarged mediastinal, hilar, or axillary lymph nodes. Thyroid gland, trachea, and esophagus demonstrate no significant findings. Lungs/Pleura: No pneumothorax is noted. Mild bibasilar subsegmental atelectasis is noted. Multiple ill-defined nodular opacities are noted throughout both lungs with the largest measuring 13 mm in the right upper lobe. This is concerning for metastatic disease or possibly multifocal infection. Upper Abdomen: No acute abnormality. Musculoskeletal: No chest wall abnormality. No acute or significant osseous findings. Review of the MIP images confirms the above findings. IMPRESSION: No definite evidence of pulmonary embolus is noted. Multiple ill-defined nodular opacities are noted throughout both lungs, the largest measuring 13 mm in the right upper lobe. This is concerning for diffuse metastatic disease or possibly multifocal pneumonia. Clinical correlation and short-term follow-up CT scan may be performed. Mild bibasilar subsegmental atelectasis is noted. Aortic Atherosclerosis (ICD10-I70.0). Electronically Signed   By: JMarijo ConceptionM.D.   On: 07/05/2021 11:44   MR BRAIN W WO CONTRAST  Result Date: 07/03/2021 CLINICAL DATA:  Mental status change, unknown cause EXAM: MRI HEAD WITHOUT AND WITH CONTRAST TECHNIQUE: Multiplanar, multiecho pulse sequences of the brain and surrounding structures were obtained without and with intravenous contrast. CONTRAST:  165mGADAVIST GADOBUTROL 1 MMOL/ML IV SOLN COMPARISON:  None. FINDINGS: Brain: No acute infarction, hemorrhage, hydrocephalus, extra-axial collection, or mass lesion. The ventricles and sulci are within normal limits for age. No abnormal enhancement. No foci of hemosiderin  deposition to suggest remote hemorrhage. Vascular: Normal flow voids. Skull and upper cervical spine: Normal marrow signal. Sinuses/Orbits: Small mucous retention cyst in the left maxillary sinus. Otherwise negative. Other: Trace fluid in right mastoid air cells. IMPRESSION: No acute intracranial process. Electronically Signed   By: AlMerilyn Baba.D.   On: 07/03/2021 22:45        Scheduled Meds:  furosemide  40 mg Intravenous Once   insulin aspart  0-20 Units Subcutaneous TID WC   insulin aspart  0-5 Units Subcutaneous QHS   insulin aspart  6 Units Subcutaneous TID WC   insulin glargine-yfgn  15 Units Subcutaneous QHS   ketorolac  15 mg Intravenous Q6H   Continuous Infusions:   ceFAZolin (  ANCEF) IV 2 g (07/05/21 0603)     LOS: 2 days    Time spent: 35 minutes    Sidney Ace, MD [facility]   If 7PM-7AM, please contact night-coverage  07/05/2021, 12:42 PM

## 2021-07-05 NOTE — Progress Notes (Signed)
Entered patient's room due to sudden onset SOB. Patient in the bed shaking. Once O2 sat was finally obtained, it was 93-94% on 4L. Resp rate elevated, PRN morphine given. Dr. Priscella Mann paged and IVF ordered to be stopped as well as CXR in addition to CTA chest. When this nurse entered room again to stop IVF, patient reports feeling "100% better," O2 reading 100% on 4L, O2 decreased back to 3L. Significant other at bedside, will continue to monitor.

## 2021-07-05 NOTE — Progress Notes (Addendum)
Inpatient Diabetes Program Recommendations  AACE/ADA: New Consensus Statement on Inpatient Glycemic Control   Target Ranges:  Prepandial:   less than 140 mg/dL      Peak postprandial:   less than 180 mg/dL (1-2 hours)      Critically ill patients:  140 - 180 mg/dL   Results for Joshua Cole, Joshua Cole (MRN [id number]) as of 07/05/2021 08:01  Ref. Range 07/03/2021 11:48 07/03/2021 13:01 07/03/2021 14:04 07/03/2021 16:48 07/03/2021 20:50 07/04/2021 13:02 07/04/2021 17:10 07/04/2021 20:53  Glucose-Capillary Latest Ref Range: 70 - 99 mg/dL 368 (H) 283 (H) 264 (H) 255 (H) 261 (H) 381 (H) 481 (H) 387 (H)   Results for Joshua Cole, Joshua Cole (MRN [id number]) as of 07/05/2021 08:01  Ref. Range 07/03/2021 09:59  Glucose Latest Ref Range: 70 - 99 mg/dL 337 (H)   Review of Glycemic Control  Diabetes history: No Outpatient Diabetes medications: NA Current orders for Inpatient glycemic control: Semglee 10 units QHS, Novolog 0-20 units TID with meals, Novolog 0-5 units QHS  Inpatient Diabetes Program Recommendations:    Insulin: Please consider increasing Semglee to 15 units QHS and ordering Novolog 6 units TID with meals for meal coverage if patient eats at least 50% of meals.  NOTE: Noted consult for diabetes coordinator. Per chart, patient does not have a history of DM. Per notes, patient received COVID and Flu vaccine 06/25/21 and began developing fever of 102 and chills.  He complains of multiple joint pain, including right hand, left shoulder, bilateral hip and lower back. Patient went to urgent care on 07/01/21 and  tested negative for flu and COVID, but started on Tamiflu and steroid on 10/20. Initial lab glucose 337 mg/dl on 07/03/21. Patient was ordered Solumedrol on 07/03/21 and 07/04/21 which is contributing to hyperglycemia. Current A1C in process. Noted patient does not have any insurance nor PCP. Will plan to follow up with patient today.  Addendum 07/05/21@12 :40-Spoke with patient regarding  hyperglycemia. Patient lying in bed with eyes closed and opened eyes to voice. Patient very drowsy during short conversation and reports he still feels poorly. Patient states that he has no prior DM hx and no family DM hx that he is aware of. Patient confirms that he has no PCP and has not seen a primary care provider in "a very long time". Patient reports that he was prescribed Prednisone from Urgent Care and he had started them on 07/01/21 when they were prescribed. Explained that initial glucose was 337 mg/dl on labs which was elevated so an A1C was ordered. Explained what an A1C is and informed patient that I would follow up with him once A1C resulted. Patient verbalized understanding and has no questions at this time.  Thanks, Barnie Alderman, RN, MSN, CDE Diabetes Coordinator Inpatient Diabetes Program [phone] (Team Pager from 8am to 5pm)

## 2021-07-05 NOTE — Consult Note (Signed)
Infectious Disease     Reason for Consult:Staph bacteremia    Referring Physician: Dr Priscella Mann Date of Admission:  07/03/2021   Principal Problem:   Hyponatremia Active Problems:   Acute metabolic encephalopathy   Joint pain   Generalized weakness   SIRS (systemic inflammatory response syndrome) (HCC)   Hypokalemia   Hyperglycemia   Thrombocytopenia (HCC)   Nausea vomiting and diarrhea   Gout   HPI: Joshua Cole is a 60 y.o. male with a history of osteoarthritis status post hip replacement as well as gout and alcohol use who presents with generalized weakness joint pain confusion nausea vomiting fever.  Symptoms have been ongoing for several days.  He was noted to have a fever of 102 Saturday night.  He was having pain in his hands shoulder bilateral hip and lower back.  He was seen October 28 in urgent care and treated with Tamiflu and steroids.  He tested negative apparently for flu and COVID.  On admission his white count was 17 platelets 68 sodium level 122.  Chest x-ray was negative.  He was admitted to the hospital started on IV cefazolin.  Blood cultures turned positive for methicillin sensitive staph aureus.  This was from October 22.  Follow-up blood cultures October 24 are pending.  Urine culture showed mixed species.  His white count is now 15.  He was found to have an A1c of 7.8 as well.    He had a CT of his chest done to rule out PE but that did show multifocal opacities consistent with either multifocal infection or metastatic disease.  He had an MRI of his brain due to his confusion that showed no acute intracranial process.  MRI of the pelvis is pending.  He has had an echocardiogram which showed likely mobile vegetation on the tricuspid valve.  He denies any skin or soft tissue infections, IVDU.  Past Medical History:  Diagnosis Date   Arthritis    DJD (degenerative joint disease)    Gout    Past Surgical History:  Procedure Laterality Date   JOINT REPLACEMENT      Social History   Tobacco Use   Smoking status: Never   Smokeless tobacco: Never  Substance Use Topics   Alcohol use: Yes    Comment: bilateral hip replacement   Drug use: Never   Family History  Problem Relation Age of Onset   Psoriasis Sister     Allergies: No Known Allergies  Current antibiotics: Antibiotics Given (last 72 hours)     Date/Time Action Medication Dose Rate   07/04/21 0530 New Bag/Given   ceFAZolin (ANCEF) IVPB 2g/100 mL premix 2 g 200 mL/hr   07/04/21 1319 New Bag/Given   ceFAZolin (ANCEF) IVPB 2g/100 mL premix 2 g 200 mL/hr   07/04/21 2157 New Bag/Given   ceFAZolin (ANCEF) IVPB 2g/100 mL premix 2 g 200 mL/hr   07/05/21 0603 New Bag/Given   ceFAZolin (ANCEF) IVPB 2g/100 mL premix 2 g 200 mL/hr       MEDICATIONS:  furosemide  40 mg Intravenous Once   insulin aspart  0-20 Units Subcutaneous TID WC   insulin aspart  0-5 Units Subcutaneous QHS   insulin aspart  6 Units Subcutaneous TID WC   insulin glargine-yfgn  15 Units Subcutaneous QHS   ketorolac  15 mg Intravenous Q6H    Review of Systems - 11 systems reviewed and negative per HPI   OBJECTIVE: Temp:  [97.5 F (36.4 C)-98.6 F (37 C)] 98.2 F (36.8  C) (10/24 1025) Pulse Rate:  [64-91] 91 (10/24 1025) Resp:  [20-32] 32 (10/24 1025) BP: (107-145)/(58-109) 145/109 (10/24 1025) SpO2:  [94 %-100 %] 95 % (10/24 1025) Physical Exam  Constitutional: slowed mentation. Sitting in chair HENT: anicteric Mouth/Throat: Oropharynx is clear and moist. No oropharyngeal exudate.  Cardiovascular: Normal rate, regular rhythm and normal heart sounds. 2/6 sm  Pulmonary/Chest: Effort normal and breath sounds normal. No respiratory distress. He has no wheezes.  Abdominal: Soft. Bowel sounds are normal. He exhibits no distension. There is no tenderness.  Lymphadenopathy: He has no cervical adenopathy.  Neurological: He has slowed mentation, moving all 4. Skin: Skin is warm and dry. No rash noted. No  erythema.  Psychiatric:  His behavior is normal.     LABS: Results for orders placed or performed during the hospital encounter of 07/03/21 (from the past 48 hour(s))  POC CBG, ED     Status: Abnormal   Collection Time: 07/03/21  1:01 PM  Result Value Ref Range   Glucose-Capillary 283 (H) 70 - 99 mg/dL    Comment: Glucose reference range applies only to samples taken after fasting for at least 8 hours.  POC CBG, ED     Status: Abnormal   Collection Time: 07/03/21  2:04 PM  Result Value Ref Range   Glucose-Capillary 264 (H) 70 - 99 mg/dL    Comment: Glucose reference range applies only to samples taken after fasting for at least 8 hours.  Urinalysis, Complete w Microscopic     Status: Abnormal   Collection Time: 07/03/21  3:09 PM  Result Value Ref Range   Color, Urine YELLOW (A) YELLOW   APPearance CLEAR (A) CLEAR   Specific Gravity, Urine 1.012 1.005 - 1.030   pH 5.0 5.0 - 8.0   Glucose, UA >=500 (A) NEGATIVE mg/dL   Hgb urine dipstick SMALL (A) NEGATIVE   Bilirubin Urine NEGATIVE NEGATIVE   Ketones, ur NEGATIVE NEGATIVE mg/dL   Protein, ur NEGATIVE NEGATIVE mg/dL   Nitrite NEGATIVE NEGATIVE   Leukocytes,Ua NEGATIVE NEGATIVE   RBC / HPF 0-5 0 - 5 RBC/hpf   WBC, UA 0-5 0 - 5 WBC/hpf   Bacteria, UA RARE (A) NONE SEEN   Squamous Epithelial / LPF 0-5 0 - 5   Mucus PRESENT    Hyaline Casts, UA PRESENT     Comment: Performed at [facility], [address] [place], [place] [address]  Urine Culture     Status: Abnormal   Collection Time: 07/03/21  3:09 PM   Specimen: Urine, Random  Result Value Ref Range   Specimen Description      URINE, RANDOM Performed at [facility], [address], [place], [place] [address]    Special Requests      NONE Performed at [facility], [address] [place], [place] [address]    Culture MULTIPLE SPECIES PRESENT, SUGGEST RECOLLECTION (A)    Report Status 07/05/2021 FINAL   Osmolality, urine     Status: None    Collection Time: 07/03/21  3:09 PM  Result Value Ref Range   Osmolality, Ur 428 300 - 900 mOsm/kg    Comment: Performed at [facility], [address] [place], [place] [address]  Sodium, urine, random     Status: None   Collection Time: 07/03/21  3:09 PM  Result Value Ref Range   Sodium, Ur <10 mmol/L    Comment: Performed at [facility], [address] [place], [place] [address]  Glucose, capillary     Status: Abnormal   Collection Time: 07/03/21  4:48 PM  Result Value Ref Range   Glucose-Capillary 255 (H) 70 - 99 mg/dL    Comment: Glucose reference range applies only to samples taken after fasting for at least 8 hours.   Comment 1 Notify RN   Lactate dehydrogenase     Status: Abnormal   Collection Time: 07/03/21  4:55 PM  Result Value Ref Range   LDH 209 (H) 98 - 192 U/L    Comment: Performed at [facility], [address] [place], [place] [address]  Pathologist smear review     Status: None   Collection Time: 07/03/21  4:55 PM  Result Value Ref Range   Path Review Blood smear is reviewed.     Comment: Neutrophilia, consistent with known sepsis.  Morphology of RBCs, WBCs, and platelets within normal limits. Reviewed by Elmon Kirschner, M.D. Performed at [facility], [address] [place], [place] [address]   Save Smear     Status: None   Collection Time: 07/03/21  4:55 PM  Result Value Ref Range   Smear Review SMEAR STAINED AND AVAILABLE FOR REVIEW     Comment: Performed at [facility], [address] [place], [place] [address]  Differential     Status: Abnormal   Collection Time: 07/03/21  4:55 PM  Result Value Ref Range   Neutrophils Relative % 89 %   Neutro Abs 11.5 (H) 1.7 - 7.7 K/uL   Lymphocytes Relative 4 %   Lymphs Abs 0.6 (L) 0.7 - 4.0 K/uL   Monocytes Relative 5 %   Monocytes Absolute 0.7 0.1 - 1.0 K/uL   Eosinophils Relative 0 %   Eosinophils Absolute 0.0 0.0 - 0.5 K/uL   Basophils Relative 1 %    Basophils Absolute 0.1 0.0 - 0.1 K/uL   WBC Morphology TOXIC GRANULATION     Comment: VACUOLATED NEUTROPHILS   RBC Morphology MORPHOLOGY UNREMARKABLE    Smear Review PLATELETS APPEAR DECREASED    Immature Granulocytes 1 %   Abs Immature Granulocytes 0.19 (H) 0.00 - 0.07 K/uL    Comment: Performed at [facility], [address], [place], [place] [address]  Basic metabolic panel     Status: Abnormal   Collection Time: 07/03/21  4:55 PM  Result Value Ref Range   Sodium 124 (L) 135 - 145 mmol/L   Potassium 3.4 (L) 3.5 - 5.1 mmol/L   Chloride 85 (L) 98 - 111 mmol/L   CO2 27 22 - 32 mmol/L   Glucose, Bld 267 (H) 70 - 99 mg/dL    Comment: Glucose reference range applies only to samples taken after fasting for at least 8 hours.   BUN 31 (H) 6 - 20 mg/dL   Creatinine, Ser 1.19 0.61 - 1.24 mg/dL   Calcium 8.1 (L) 8.9 - 10.3 mg/dL   GFR, Estimated >60 >60 mL/min    Comment: (NOTE) Calculated using the CKD-EPI Creatinine Equation (2021)    Anion gap 12 5 - 15    Comment: Performed at [facility], [address] [place], [place] [address]  Osmolality     Status: None   Collection Time: 07/03/21  4:55 PM  Result Value Ref Range   Osmolality 279 275 - 295 mOsm/kg    Comment: Performed at [facility], [address] [place],  [address]  Magnesium     Status: None   Collection Time: 07/03/21  4:55 PM  Result Value Ref Range   Magnesium 2.2 1.7 - 2.4 mg/dL    Comment: Performed at [facility], [address] [place], [place] [address]  Phosphorus     Status: None   Collection Time: 07/03/21  4:55 PM  Result Value Ref Range   Phosphorus 3.4 2.5 - 4.6 mg/dL    Comment: Performed at [facility], [address] [place], [place] [address]  Protime-INR     Status: None   Collection Time: 07/03/21  4:55 PM  Result Value Ref Range   Prothrombin Time 14.3 11.4 - 15.2 seconds   INR 1.1 0.8 - 1.2    Comment: (NOTE) INR goal varies  based on device and disease states. Performed at [facility], [address] [place], [place] [address]   Culture, blood (x 2)     Status: Abnormal (Preliminary result)   Collection Time: 07/03/21  4:55 PM   Specimen: BLOOD  Result Value Ref Range   Specimen Description      BLOOD LEFT ANTECUBITAL Performed at [facility], [address] [place], [place] [address]    Special Requests      BOTTLES DRAWN AEROBIC AND ANAEROBIC Blood Culture results may not be optimal due to an excessive volume of blood received in culture bottles Performed at [facility], [address] [place], [place] [address]    Culture  Setup Time      IN BOTH AEROBIC AND ANAEROBIC BOTTLES GRAM POSITIVE COCCI CRITICAL RESULT CALLED TO, READ BACK BY AND VERIFIED WITH: NATHAN BELUE AT 8502 07/04/21.PMF Performed at [facility], [place] [address], [place], [place] [address]    Culture STAPHYLOCOCCUS AUREUS (A)    Report Status PENDING   APTT     Status: None   Collection Time: 07/03/21  4:55 PM  Result Value Ref Range   aPTT 25 24 - 36 seconds    Comment: Performed at [facility], [address] [place], [place] [address]  Uric acid     Status: None   Collection Time: 07/03/21  4:55 PM  Result Value Ref Range   Uric Acid, Serum 5.4 3.7 - 8.6 mg/dL    Comment: Performed at [facility], [address] [place],  [address]  Hemoglobin A1c     Status: Abnormal   Collection Time: 07/03/21  4:55 PM  Result Value Ref Range   Hgb A1c MFr Bld 7.8 (H) 4.8 - 5.6 %    Comment: (NOTE)         Prediabetes: 5.7 - 6.4         Diabetes: >6.4         Glycemic control for adults with diabetes: <7.0    Mean Plasma Glucose 177 mg/dL    Comment: (NOTE) Performed At: [facility] [place], [place] [id number] Rush Farmer MD OQ:[id number]   HIV Antibody (routine testing w rflx)     Status: None   Collection Time: 07/03/21  4:55 PM   Result Value Ref Range   HIV Screen 4th Generation wRfx Non Reactive Non Reactive    Comment: Performed at [facility], [place] [address], [place] [address]  CBC     Status: Abnormal   Collection Time: 07/03/21  4:55 PM  Result Value Ref Range   WBC 13.1 (H) 4.0 - 10.5 K/uL   RBC 4.60 4.22 - 5.81 MIL/uL   Hemoglobin 15.1 13.0 -  17.0 g/dL   HCT 41.9 39.0 - 52.0 %   MCV 91.1 80.0 - 100.0 fL   MCH 32.8 26.0 - 34.0 pg   MCHC 36.0 30.0 - 36.0 g/dL   RDW 11.9 11.5 - 15.5 %   Platelets 67 (L) 150 - 400 K/uL   nRBC 0.0 0.0 - 0.2 %    Comment: Performed at [facility], [address] [place], [place] [address]  Blood Culture ID Panel (Reflexed)     Status: Abnormal   Collection Time: 07/03/21  4:55 PM  Result Value Ref Range   Enterococcus faecalis NOT DETECTED NOT DETECTED   Enterococcus Faecium NOT DETECTED NOT DETECTED   Listeria monocytogenes NOT DETECTED NOT DETECTED   Staphylococcus species DETECTED (A) NOT DETECTED    Comment: CRITICAL RESULT CALLED TO, READ BACK BY AND VERIFIED WITH: NATHAN BELUE AT 0626 07/04/21.PMF    Staphylococcus aureus (BCID) DETECTED (A) NOT DETECTED    Comment: CRITICAL RESULT CALLED TO, READ BACK BY AND VERIFIED WITH: NATHAN BELUE AT 9485 07/04/21.PMF    Staphylococcus epidermidis NOT DETECTED NOT DETECTED   Staphylococcus lugdunensis NOT DETECTED NOT DETECTED   Streptococcus species NOT DETECTED NOT DETECTED   Streptococcus agalactiae NOT DETECTED NOT DETECTED   Streptococcus pneumoniae NOT DETECTED NOT DETECTED   Streptococcus pyogenes NOT DETECTED NOT DETECTED   A.calcoaceticus-baumannii NOT DETECTED NOT DETECTED   Bacteroides fragilis NOT DETECTED NOT DETECTED   Enterobacterales NOT DETECTED NOT DETECTED   Enterobacter cloacae complex NOT DETECTED NOT DETECTED   Escherichia coli NOT DETECTED NOT DETECTED   Klebsiella aerogenes NOT DETECTED NOT DETECTED   Klebsiella oxytoca NOT DETECTED NOT DETECTED   Klebsiella  pneumoniae NOT DETECTED NOT DETECTED   Proteus species NOT DETECTED NOT DETECTED   Salmonella species NOT DETECTED NOT DETECTED   Serratia marcescens NOT DETECTED NOT DETECTED   Haemophilus influenzae NOT DETECTED NOT DETECTED   Neisseria meningitidis NOT DETECTED NOT DETECTED   Pseudomonas aeruginosa NOT DETECTED NOT DETECTED   Stenotrophomonas maltophilia NOT DETECTED NOT DETECTED   Candida albicans NOT DETECTED NOT DETECTED   Candida auris NOT DETECTED NOT DETECTED   Candida glabrata NOT DETECTED NOT DETECTED   Candida krusei NOT DETECTED NOT DETECTED   Candida parapsilosis NOT DETECTED NOT DETECTED   Candida tropicalis NOT DETECTED NOT DETECTED   Cryptococcus neoformans/gattii NOT DETECTED NOT DETECTED   Meth resistant mecA/C and MREJ NOT DETECTED NOT DETECTED    Comment: Performed at [facility], [address] [place], [place] [address]  Culture, blood (x 2)     Status: Abnormal (Preliminary result)   Collection Time: 07/03/21  6:37 PM   Specimen: BLOOD  Result Value Ref Range   Specimen Description      BLOOD RIGHT HAND Performed at [facility], [address] [place], [place] [address]    Special Requests      BOTTLES DRAWN AEROBIC AND ANAEROBIC Blood Culture adequate volume Performed at [facility], [address] [place], [place] [address]    Culture  Setup Time      GRAM POSITIVE COCCI IN BOTH AEROBIC AND ANAEROBIC BOTTLES CRITICAL RESULT CALLED TO, READ BACK BY AND VERIFIED WITH: NATHAN BELUE AT 9937 07/04/21.PMF Performed at [facility], [address] [place],  [address]    Culture STAPHYLOCOCCUS AUREUS (A)    Report Status PENDING   Glucose, capillary     Status: Abnormal   Collection Time: 07/03/21  8:50 PM  Result Value Ref  Range   Glucose-Capillary 261 (H) 70 - 99 mg/dL    Comment: Glucose reference range applies only to samples taken after fasting for at least 8 hours.  Basic metabolic panel      Status: Abnormal   Collection Time: 07/03/21 11:01 PM  Result Value Ref Range   Sodium 122 (L) 135 - 145 mmol/L   Potassium 4.1 3.5 - 5.1 mmol/L   Chloride 87 (L) 98 - 111 mmol/L   CO2 24 22 - 32 mmol/L   Glucose, Bld 312 (H) 70 - 99 mg/dL    Comment: Glucose reference range applies only to samples taken after fasting for at least 8 hours.   BUN 32 (H) 6 - 20 mg/dL   Creatinine, Ser 0.99 0.61 - 1.24 mg/dL   Calcium 7.8 (L) 8.9 - 10.3 mg/dL   GFR, Estimated >60 >60 mL/min    Comment: (NOTE) Calculated using the CKD-EPI Creatinine Equation (2021)    Anion gap 11 5 - 15    Comment: Performed at [facility], [address] [place], [place] [address]  CBC     Status: Abnormal   Collection Time: 07/04/21  5:06 AM  Result Value Ref Range   WBC 16.2 (H) 4.0 - 10.5 K/uL   RBC 4.11 (L) 4.22 - 5.81 MIL/uL   Hemoglobin 14.3 13.0 - 17.0 g/dL   HCT 38.7 (L) 39.0 - 52.0 %   MCV 94.2 80.0 - 100.0 fL   MCH 34.8 (H) 26.0 - 34.0 pg   MCHC 37.0 (H) 30.0 - 36.0 g/dL   RDW 12.0 11.5 - 15.5 %   Platelets 59 (L) 150 - 400 K/uL    Comment: Immature Platelet Fraction may be clinically indicated, consider ordering this additional test [id number]    nRBC 0.0 0.0 - 0.2 %    Comment: Performed at [facility] [address] [place], [place] [address]  Basic metabolic panel     Status: Abnormal   Collection Time: 07/04/21  5:06 AM  Result Value Ref Range   Sodium 128 (L) 135 - 145 mmol/L   Potassium 4.1 3.5 - 5.1 mmol/L   Chloride 94 (L) 98 - 111 mmol/L   CO2 27 22 - 32 mmol/L   Glucose, Bld 281 (H) 70 - 99 mg/dL    Comment: Glucose reference range applies only to samples taken after fasting for at least 8 hours.   BUN 29 (H) 6 - 20 mg/dL   Creatinine, Ser 0.91 0.61 - 1.24 mg/dL   Calcium 7.8 (L) 8.9 - 10.3 mg/dL   GFR, Estimated >60 >60 mL/min    Comment: (NOTE) Calculated using the CKD-EPI Creatinine Equation (2021)    Anion gap 7 5 - 15    Comment: Performed at  [facility], [address] [place], [place] [address]  D-dimer, quantitative     Status: Abnormal   Collection Time: 07/04/21  5:06 AM  Result Value Ref Range   D-Dimer, Quant 3.78 (H) 0.00 - 0.50 ug/mL-FEU    Comment: (NOTE) At the manufacturer cut-off value of 0.5 g/mL FEU, this assay has a negative predictive value of 95-100%.This assay is intended for use in conjunction with a clinical pretest probability (PTP) assessment model to exclude pulmonary embolism (PE) and deep venous thrombosis (DVT) in outpatients suspected of PE or DVT. Results should be correlated with clinical presentation. Performed at [facility], [address] [place], [place] [address]   Protime-INR     Status: None  Collection Time: 07/04/21  5:06 AM  Result Value Ref Range   Prothrombin Time 14.9 11.4 - 15.2 seconds   INR 1.2 0.8 - 1.2    Comment: (NOTE) INR goal varies based on device and disease states. Performed at [facility], [address] [place], [place] [address]   APTT     Status: Abnormal   Collection Time: 07/04/21  5:06 AM  Result Value Ref Range   aPTT 22 (L) 24 - 36 seconds    Comment: Performed at [facility], [address] [place], [place] [address]  Lactate dehydrogenase     Status: Abnormal   Collection Time: 07/04/21  5:06 AM  Result Value Ref Range   LDH 218 (H) 98 - 192 U/L    Comment: Performed at [facility], [address] [place], [place] [address]  Glucose, capillary     Status: Abnormal   Collection Time: 07/04/21  1:02 PM  Result Value Ref Range   Glucose-Capillary 381 (H) 70 - 99 mg/dL    Comment: Glucose reference range applies only to samples taken after fasting for at least 8 hours.  Glucose, capillary     Status: Abnormal   Collection Time: 07/04/21  5:10 PM  Result Value Ref Range   Glucose-Capillary 481 (H) 70 - 99 mg/dL    Comment: Glucose reference range applies only to samples taken after fasting  for at least 8 hours.  Glucose, capillary     Status: Abnormal   Collection Time: 07/04/21  8:53 PM  Result Value Ref Range   Glucose-Capillary 387 (H) 70 - 99 mg/dL    Comment: Glucose reference range applies only to samples taken after fasting for at least 8 hours.  CBC with Differential/Platelet     Status: Abnormal   Collection Time: 07/05/21  7:47 AM  Result Value Ref Range   WBC 15.9 (H) 4.0 - 10.5 K/uL   RBC 3.68 (L) 4.22 - 5.81 MIL/uL   Hemoglobin 12.6 (L) 13.0 - 17.0 g/dL   HCT 34.6 (L) 39.0 - 52.0 %   MCV 94.0 80.0 - 100.0 fL   MCH 34.2 (H) 26.0 - 34.0 pg   MCHC 36.4 (H) 30.0 - 36.0 g/dL   RDW 12.2 11.5 - 15.5 %   Platelets 71 (L) 150 - 400 K/uL    Comment: Immature Platelet Fraction may be clinically indicated, consider ordering this additional test [id number]    nRBC 0.0 0.0 - 0.2 %   Neutrophils Relative % 84 %   Neutro Abs 13.3 (H) 1.7 - 7.7 K/uL   Lymphocytes Relative 7 %   Lymphs Abs 1.1 0.7 - 4.0 K/uL   Monocytes Relative 7 %   Monocytes Absolute 1.1 (H) 0.1 - 1.0 K/uL   Eosinophils Relative 0 %   Eosinophils Absolute 0.0 0.0 - 0.5 K/uL   Basophils Relative 0 %   Basophils Absolute 0.1 0.0 - 0.1 K/uL   WBC Morphology MORPHOLOGY UNREMARKABLE    RBC Morphology MORPHOLOGY UNREMARKABLE    Smear Review Normal platelet morphology    Immature Granulocytes 2 %   Abs Immature Granulocytes 0.35 (H) 0.00 - 0.07 K/uL    Comment: Performed at [facility], [address] [place],  [address]  Basic metabolic panel     Status: Abnormal   Collection Time: 07/05/21  7:47 AM  Result Value Ref Range   Sodium 127 (L) 135 - 145 mmol/L   Potassium 3.7 3.5 - 5.1 mmol/L  Chloride 92 (L) 98 - 111 mmol/L   CO2 27 22 - 32 mmol/L   Glucose, Bld 282 (H) 70 - 99 mg/dL    Comment: Glucose reference range applies only to samples taken after fasting for at least 8 hours.   BUN 30 (H) 6 - 20 mg/dL   Creatinine, Ser 0.87 0.61 - 1.24 mg/dL   Calcium 7.8 (L) 8.9 - 10.3  mg/dL   GFR, Estimated >60 >60 mL/min    Comment: (NOTE) Calculated using the CKD-EPI Creatinine Equation (2021)    Anion gap 8 5 - 15    Comment: Performed at [facility], [address] [place], [place] [address]  Brain natriuretic peptide     Status: Abnormal   Collection Time: 07/05/21  7:47 AM  Result Value Ref Range   B Natriuretic Peptide 219.6 (H) 0.0 - 100.0 pg/mL    Comment: Performed at [facility], [address] [place], [place] [address]  Glucose, capillary     Status: Abnormal   Collection Time: 07/05/21  7:52 AM  Result Value Ref Range   Glucose-Capillary 286 (H) 70 - 99 mg/dL    Comment: Glucose reference range applies only to samples taken after fasting for at least 8 hours.   No components found for: ESR, C REACTIVE PROTEIN MICRO: Recent Results (from the past 720 hour(s))  Resp Panel by RT-PCR (Flu A&B, Covid) Nasopharyngeal Swab     Status: None   Collection Time: 07/03/21 12:08 PM   Specimen: Nasopharyngeal Swab; Nasopharyngeal(NP) swabs in vial transport medium  Result Value Ref Range Status   SARS Coronavirus 2 by RT PCR NEGATIVE NEGATIVE Final    Comment: (NOTE) SARS-CoV-2 target nucleic acids are NOT DETECTED.  The SARS-CoV-2 RNA is generally detectable in upper respiratory specimens during the acute phase of infection. The lowest concentration of SARS-CoV-2 viral copies this assay can detect is 138 copies/mL. A negative result does not preclude SARS-Cov-2 infection and should not be used as the sole basis for treatment or other patient management decisions. A negative result may occur with  improper specimen collection/handling, submission of specimen other than nasopharyngeal swab, presence of viral mutation(s) within the areas targeted by this assay, and inadequate number of viral copies(<138 copies/mL). A negative result must be combined with clinical observations, patient history, and epidemiological information. The  expected result is Negative.  Fact Sheet for Patients:  EntrepreneurPulse.com.au  Fact Sheet for Healthcare Providers:  IncredibleEmployment.be  This test is no t yet approved or cleared by the [place] FDA and  has been authorized for detection and/or diagnosis of SARS-CoV-2 by FDA under an Emergency Use Authorization (EUA). This EUA will remain  in effect (meaning this test can be used) for the duration of the COVID-19 declaration under Section 564(b)(1) of the Act, 21 U.S.C.section 360bbb-3(b)(1), unless the authorization is terminated  or revoked sooner.       Influenza A by PCR NEGATIVE NEGATIVE Final   Influenza B by PCR NEGATIVE NEGATIVE Final    Comment: (NOTE) The Xpert Xpress SARS-CoV-2/FLU/RSV plus assay is intended as an aid in the diagnosis of influenza from Nasopharyngeal swab specimens and should not be used as a sole basis for treatment. Nasal washings and aspirates are unacceptable for Xpert Xpress SARS-CoV-2/FLU/RSV testing.  Fact Sheet for Patients: EntrepreneurPulse.com.au  Fact Sheet for Healthcare Providers: IncredibleEmployment.be  This test is not yet approved or cleared by the [place] FDA and has been authorized for detection and/or diagnosis of SARS-CoV-2  by FDA under an Emergency Use Authorization (EUA). This EUA will remain in effect (meaning this test can be used) for the duration of the COVID-19 declaration under Section 564(b)(1) of the Act, 21 U.S.C. section 360bbb-3(b)(1), unless the authorization is terminated or revoked.  Performed at [facility], [address] [place], [place] [address]   Urine Culture     Status: Abnormal   Collection Time: 07/03/21  3:09 PM   Specimen: Urine, Random  Result Value Ref Range Status   Specimen Description   Final    URINE, RANDOM Performed at [facility], [address] [place], [place]  [address]    Special Requests   Final    NONE Performed at [facility], [address] [place], [place] [address]    Culture MULTIPLE SPECIES PRESENT, SUGGEST RECOLLECTION (A)  Final   Report Status 07/05/2021 FINAL  Final  Culture, blood (x 2)     Status: Abnormal (Preliminary result)   Collection Time: 07/03/21  4:55 PM   Specimen: BLOOD  Result Value Ref Range Status   Specimen Description   Final    BLOOD LEFT ANTECUBITAL Performed at [facility], [address], [place], [place] [address]    Special Requests   Final    BOTTLES DRAWN AEROBIC AND ANAEROBIC Blood Culture results may not be optimal due to an excessive volume of blood received in culture bottles Performed at [facility], [address] [place], [place] [address]    Culture  Setup Time   Final    IN BOTH AEROBIC AND ANAEROBIC BOTTLES GRAM POSITIVE COCCI CRITICAL RESULT CALLED TO, READ BACK BY AND VERIFIED WITH: NATHAN BELUE AT 2505 07/04/21.PMF Performed at [facility], [place] [address] [place], [place] [address]    Culture STAPHYLOCOCCUS AUREUS (A)  Final   Report Status PENDING  Incomplete  Blood Culture ID Panel (Reflexed)     Status: Abnormal   Collection Time: 07/03/21  4:55 PM  Result Value Ref Range Status   Enterococcus faecalis NOT DETECTED NOT DETECTED Final   Enterococcus Faecium NOT DETECTED NOT DETECTED Final   Listeria monocytogenes NOT DETECTED NOT DETECTED Final   Staphylococcus species DETECTED (A) NOT DETECTED Final    Comment: CRITICAL RESULT CALLED TO, READ BACK BY AND VERIFIED WITH: NATHAN BELUE AT 3419 07/04/21.PMF    Staphylococcus aureus (BCID) DETECTED (A) NOT DETECTED Final    Comment: CRITICAL RESULT CALLED TO, READ BACK BY AND VERIFIED WITH: NATHAN BELUE AT 3790 07/04/21.PMF    Staphylococcus epidermidis NOT DETECTED NOT DETECTED Final   Staphylococcus lugdunensis NOT DETECTED NOT DETECTED Final   Streptococcus species NOT DETECTED NOT  DETECTED Final   Streptococcus agalactiae NOT DETECTED NOT DETECTED Final   Streptococcus pneumoniae NOT DETECTED NOT DETECTED Final   Streptococcus pyogenes NOT DETECTED NOT DETECTED Final   A.calcoaceticus-baumannii NOT DETECTED NOT DETECTED Final   Bacteroides fragilis NOT DETECTED NOT DETECTED Final   Enterobacterales NOT DETECTED NOT DETECTED Final   Enterobacter cloacae complex NOT DETECTED NOT DETECTED Final   Escherichia coli NOT DETECTED NOT DETECTED Final   Klebsiella aerogenes NOT DETECTED NOT DETECTED Final   Klebsiella oxytoca NOT DETECTED NOT DETECTED Final   Klebsiella pneumoniae NOT DETECTED NOT DETECTED Final   Proteus species NOT DETECTED NOT DETECTED Final   Salmonella species NOT DETECTED NOT DETECTED Final   Serratia marcescens NOT DETECTED NOT DETECTED Final   Haemophilus influenzae NOT DETECTED NOT DETECTED Final   Neisseria  meningitidis NOT DETECTED NOT DETECTED Final   Pseudomonas aeruginosa NOT DETECTED NOT DETECTED Final   Stenotrophomonas maltophilia NOT DETECTED NOT DETECTED Final   Candida albicans NOT DETECTED NOT DETECTED Final   Candida auris NOT DETECTED NOT DETECTED Final   Candida glabrata NOT DETECTED NOT DETECTED Final   Candida krusei NOT DETECTED NOT DETECTED Final   Candida parapsilosis NOT DETECTED NOT DETECTED Final   Candida tropicalis NOT DETECTED NOT DETECTED Final   Cryptococcus neoformans/gattii NOT DETECTED NOT DETECTED Final   Meth resistant mecA/C and MREJ NOT DETECTED NOT DETECTED Final    Comment: Performed at [facility], [address] [place], [place] [address]  Culture, blood (x 2)     Status: Abnormal (Preliminary result)   Collection Time: 07/03/21  6:37 PM   Specimen: BLOOD  Result Value Ref Range Status   Specimen Description   Final    BLOOD RIGHT HAND Performed at [facility], [address], [place], [place] [address]    Special Requests   Final    BOTTLES DRAWN AEROBIC AND ANAEROBIC Blood  Culture adequate volume Performed at [facility], [address] [place], [place] [address]    Culture  Setup Time   Final    GRAM POSITIVE COCCI IN BOTH AEROBIC AND ANAEROBIC BOTTLES CRITICAL RESULT CALLED TO, READ BACK BY AND VERIFIED WITH: NATHAN BELUE AT 1962 07/04/21.PMF Performed at [facility], [address] [place], [place] [address]    Culture STAPHYLOCOCCUS AUREUS (A)  Final   Report Status PENDING  Incomplete    IMAGING: CT Angio Chest Pulmonary Embolism (PE) W or WO Contrast  Result Date: 07/05/2021 CLINICAL DATA:  Positive D-dimer level. EXAM: CT ANGIOGRAPHY CHEST WITH CONTRAST TECHNIQUE: Multidetector CT imaging of the chest was performed using the standard protocol during bolus administration of intravenous contrast. Multiplanar CT image reconstructions and MIPs were obtained to evaluate the vascular anatomy. CONTRAST:  49m OMNIPAQUE IOHEXOL 350 MG/ML SOLN COMPARISON:  None. FINDINGS: Cardiovascular: Satisfactory opacification of the pulmonary arteries to the segmental level. No evidence of pulmonary embolism. Normal heart size. No pericardial effusion. Atherosclerosis of thoracic aorta is noted without aneurysm formation. Mediastinum/Nodes: No enlarged mediastinal, hilar, or axillary lymph nodes. Thyroid gland, trachea, and esophagus demonstrate no significant findings. Lungs/Pleura: No pneumothorax is noted. Mild bibasilar subsegmental atelectasis is noted. Multiple ill-defined nodular opacities are noted throughout both lungs with the largest measuring 13 mm in the right upper lobe. This is concerning for metastatic disease or possibly multifocal infection. Upper Abdomen: No acute abnormality. Musculoskeletal: No chest wall abnormality. No acute or significant osseous findings. Review of the MIP images confirms the above findings. IMPRESSION: No definite evidence of pulmonary embolus is noted. Multiple ill-defined nodular opacities are noted throughout  both lungs, the largest measuring 13 mm in the right upper lobe. This is concerning for diffuse metastatic disease or possibly multifocal pneumonia. Clinical correlation and short-term follow-up CT scan may be performed. Mild bibasilar subsegmental atelectasis is noted. Aortic Atherosclerosis (ICD10-I70.0). Electronically Signed   By: JMarijo ConceptionM.D.   On: 07/05/2021 11:44   MR BRAIN W WO CONTRAST  Result Date: 07/03/2021 CLINICAL DATA:  Mental status change, unknown cause EXAM: MRI HEAD WITHOUT AND WITH CONTRAST TECHNIQUE: Multiplanar, multiecho pulse sequences of the brain and surrounding structures were obtained without and with intravenous contrast. CONTRAST:  181mGADAVIST GADOBUTROL 1 MMOL/ML IV SOLN COMPARISON:  None. FINDINGS: Brain: No acute infarction, hemorrhage, hydrocephalus, extra-axial collection, or mass lesion.  The ventricles and sulci are within normal limits for age. No abnormal enhancement. No foci of hemosiderin deposition to suggest remote hemorrhage. Vascular: Normal flow voids. Skull and upper cervical spine: Normal marrow signal. Sinuses/Orbits: Small mucous retention cyst in the left maxillary sinus. Otherwise negative. Other: Trace fluid in right mastoid air cells. IMPRESSION: No acute intracranial process. Electronically Signed   By: Merilyn Baba M.D.   On: 07/03/2021 22:45   DG Chest Portable 1 View  Result Date: 07/03/2021 CLINICAL DATA:  sob/cough EXAM: PORTABLE CHEST 1 VIEW COMPARISON:  None. FINDINGS: The cardiomediastinal silhouette is mildly enlarged in contour. No pleural effusion. No pneumothorax. No acute pleuroparenchymal abnormality. Visualized abdomen is unremarkable. IMPRESSION: No acute cardiopulmonary abnormality. Electronically Signed   By: Valentino Saxon M.D.   On: 07/03/2021 12:28    Assessment:   Joshua Cole is a 60 y.o. male with a history of osteoarthritis status post hip replacement as well as gout and alcohol use who presents with  generalized weakness joint pain confusion nausea vomiting fever.  Found to have MSSA bacteremia and TTE with possible vegetation on TV. CT chest with likely septic emboli.  No obvious source of bacteremia.  Recommendations Cont cefazolin Will need TEE. If has evidence joint fluid on MRI would aspirate hip and consult ortho.   Thank you very much for allowing me to participate in the care of this patient. Please call with questions.   Cheral Marker. Ola Spurr, MD

## 2021-07-06 DIAGNOSIS — R7881 Bacteremia: Secondary | ICD-10-CM

## 2021-07-06 LAB — HEPARIN INDUCED PLATELET AB (HIT ANTIBODY): Heparin Induced Plt Ab: 0.136 OD (ref 0.000–0.400)

## 2021-07-06 LAB — CBC WITH DIFFERENTIAL/PLATELET
Abs Immature Granulocytes: 0.33 10*3/uL — ABNORMAL HIGH (ref 0.00–0.07)
Basophils Absolute: 0 10*3/uL (ref 0.0–0.1)
Basophils Relative: 0 %
Eosinophils Absolute: 0 10*3/uL (ref 0.0–0.5)
Eosinophils Relative: 0 %
HCT: 35.2 % — ABNORMAL LOW (ref 39.0–52.0)
Hemoglobin: 12.6 g/dL — ABNORMAL LOW (ref 13.0–17.0)
Immature Granulocytes: 2 %
Lymphocytes Relative: 7 %
Lymphs Abs: 0.9 10*3/uL (ref 0.7–4.0)
MCH: 33.4 pg (ref 26.0–34.0)
MCHC: 35.8 g/dL (ref 30.0–36.0)
MCV: 93.4 fL (ref 80.0–100.0)
Monocytes Absolute: 1 10*3/uL (ref 0.1–1.0)
Monocytes Relative: 7 %
Neutro Abs: 11.5 10*3/uL — ABNORMAL HIGH (ref 1.7–7.7)
Neutrophils Relative %: 84 %
Platelets: 57 10*3/uL — ABNORMAL LOW (ref 150–400)
RBC: 3.77 MIL/uL — ABNORMAL LOW (ref 4.22–5.81)
RDW: 12.8 % (ref 11.5–15.5)
Smear Review: NORMAL
WBC: 13.8 10*3/uL — ABNORMAL HIGH (ref 4.0–10.5)
nRBC: 0 % (ref 0.0–0.2)

## 2021-07-06 LAB — BASIC METABOLIC PANEL
Anion gap: 9 (ref 5–15)
BUN: 36 mg/dL — ABNORMAL HIGH (ref 6–20)
CO2: 25 mmol/L (ref 22–32)
Calcium: 7.5 mg/dL — ABNORMAL LOW (ref 8.9–10.3)
Chloride: 91 mmol/L — ABNORMAL LOW (ref 98–111)
Creatinine, Ser: 1.04 mg/dL (ref 0.61–1.24)
GFR, Estimated: >60 mL/min (ref >60)
Glucose, Bld: 230 mg/dL — ABNORMAL HIGH (ref 70–99)
Potassium: 3.4 mmol/L — ABNORMAL LOW (ref 3.5–5.1)
Sodium: 125 mmol/L — ABNORMAL LOW (ref 135–145)

## 2021-07-06 LAB — GLUCOSE, CAPILLARY
Glucose-Capillary: 239 mg/dL — ABNORMAL HIGH (ref 70–99)
Glucose-Capillary: 173 mg/dL — ABNORMAL HIGH (ref 70–99)
Glucose-Capillary: 202 mg/dL — ABNORMAL HIGH (ref 70–99)
Glucose-Capillary: 298 mg/dL — ABNORMAL HIGH (ref 70–99)

## 2021-07-06 LAB — CULTURE, BLOOD (ROUTINE X 2): Special Requests: ADEQUATE

## 2021-07-06 LAB — SEDIMENTATION RATE: Sed Rate: 51 mm/hr — ABNORMAL HIGH (ref 0–20)

## 2021-07-06 MED ORDER — PANTOPRAZOLE SODIUM 40 MG PO TBEC
40.0000 mg | DELAYED_RELEASE_TABLET | Freq: Every day | ORAL | Status: DC
  Administered 2021-07-06 – 2021-07-08 (×3): 40 mg via ORAL
  Filled 2021-07-06 (×3): qty 1

## 2021-07-06 MED ORDER — ADULT MULTIVITAMIN W/MINERALS CH
1.0000 | ORAL_TABLET | Freq: Every day | ORAL | Status: DC
  Administered 2021-07-06 – 2021-07-08 (×3): 1 via ORAL
  Filled 2021-07-06 (×3): qty 1

## 2021-07-06 MED ORDER — LIVING WELL WITH DIABETES BOOK
Freq: Once | Status: AC
  Filled 2021-07-06: qty 1

## 2021-07-06 MED ORDER — INSULIN GLARGINE-YFGN 100 UNIT/ML ~~LOC~~ SOLN
23.0000 [IU] | Freq: Every day | SUBCUTANEOUS | Status: DC
  Administered 2021-07-07: 23 [IU] via SUBCUTANEOUS
  Filled 2021-07-06 (×3): qty 0.23

## 2021-07-06 MED ORDER — INSULIN GLARGINE-YFGN 100 UNIT/ML ~~LOC~~ SOLN
15.0000 [IU] | Freq: Once | SUBCUTANEOUS | Status: AC
  Administered 2021-07-07: 15 [IU] via SUBCUTANEOUS
  Filled 2021-07-06: qty 0.15

## 2021-07-06 MED ORDER — SODIUM CHLORIDE 0.9 % IV SOLN: INTRAVENOUS | Status: DC

## 2021-07-06 NOTE — Progress Notes (Signed)
Progress Note  Patient Name: Joshua Cole Date of Encounter: 07/06/2021  [facility] Cardiologist: New  Subjective   Plan for TEE tomorrow as patient ate breakfast today. He is overall feeling better.   Inpatient Medications    Scheduled Meds:  insulin aspart  0-20 Units Subcutaneous TID WC   insulin aspart  0-5 Units Subcutaneous QHS   insulin aspart  6 Units Subcutaneous TID WC   insulin glargine-yfgn  15 Units Subcutaneous QHS   ketorolac  15 mg Intravenous Q6H   Continuous Infusions:  sodium chloride 20 mL/hr at 07/06/21 0715    ceFAZolin (ANCEF) IV 2 g (07/06/21 0620)   PRN Meds: acetaminophen, gadobutrol, morphine injection, ondansetron (ZOFRAN) IV, oxyCODONE   Vital Signs    Vitals:   07/05/21 1606 07/05/21 1758 07/05/21 2009 07/06/21 0709  BP: (!) 87/53 (!) 115/59 102/77 (!) 100/57  Pulse: 88 72 61 (!) 56  Resp: 20 18 16 20   Temp: (!) 101.2 F (38.4 C) 98.9 F (37.2 C) 98 F (36.7 C) 99.3 F (37.4 C)  TempSrc: Oral Oral  Oral  SpO2: 97% 97% 98% 98%  Weight:      Height:        Intake/Output Summary (Last 24 hours) at 07/06/2021 0953 Last data filed at 07/06/2021 0500 Gross per 24 hour  Intake 1224.67 ml  Output 1450 ml  Net -225.33 ml   Last 3 Weights 07/03/2021 07/03/2021  Weight (lbs) 249 lb 4.8 oz 232 lb  Weight (kg) 113.082 kg 105.235 kg      Telemetry    N/A - Personally Reviewed  ECG    No new - Personally Reviewed  Physical Exam   GEN: No acute distress.   Neck: No JVD Cardiac: RRR, no murmur, no rubs, or gallops.  Respiratory: Clear to auscultation bilaterally. GI: Soft, nontender, non-distended  MS: No edema; No deformity. Neuro:  Nonfocal  Psych: Normal affect   Labs    High Sensitivity Troponin:  No results for input(s): TROPONINIHS in the last 720 hours.   Chemistry Recent Labs  Lab 07/03/21 1655 07/03/21 2301 07/04/21 0506 07/05/21 0747 07/06/21 0814  NA 124*   < > 128* 127* 125*  K 3.4*   < > 4.1  3.7 3.4*  CL 85*   < > 94* 92* 91*  CO2 27   < > 27 27 25   GLUCOSE 267*   < > 281* 282* 230*  BUN 31*   < > 29* 30* 36*  CREATININE 1.19   < > 0.91 0.87 1.04  CALCIUM 8.1*   < > 7.8* 7.8* 7.5*  MG 2.2  --   --   --   --   GFRNONAA >60   < > >60 >60 >60  ANIONGAP 12   < > 7 8 9    < > = values in this interval not displayed.    Lipids No results for input(s): CHOL, TRIG, HDL, LABVLDL, LDLCALC, CHOLHDL in the last 168 hours.  Hematology Recent Labs  Lab 07/04/21 0506 07/05/21 0747 07/06/21 0814  WBC 16.2* 15.9* 13.8*  RBC 4.11* 3.68* 3.77*  HGB 14.3 12.6* 12.6*  HCT 38.7* 34.6* 35.2*  MCV 94.2 94.0 93.4  MCH 34.8* 34.2* 33.4  MCHC 37.0* 36.4* 35.8  RDW 12.0 12.2 12.8  PLT 59* 71* 57*   Thyroid No results for input(s): TSH, FREET4 in the last 168 hours.  BNP Recent Labs  Lab 07/05/21 0747  BNP 219.6*    DDimer  Recent Labs  Lab 07/04/21 0506  DDIMER 3.78*     Radiology    CT Angio Chest Pulmonary Embolism (PE) W or WO Contrast  Result Date: 07/05/2021 CLINICAL DATA:  Positive D-dimer level. EXAM: CT ANGIOGRAPHY CHEST WITH CONTRAST TECHNIQUE: Multidetector CT imaging of the chest was performed using the standard protocol during bolus administration of intravenous contrast. Multiplanar CT image reconstructions and MIPs were obtained to evaluate the vascular anatomy. CONTRAST:  39mL OMNIPAQUE IOHEXOL 350 MG/ML SOLN COMPARISON:  None. FINDINGS: Cardiovascular: Satisfactory opacification of the pulmonary arteries to the segmental level. No evidence of pulmonary embolism. Normal heart size. No pericardial effusion. Atherosclerosis of thoracic aorta is noted without aneurysm formation. Mediastinum/Nodes: No enlarged mediastinal, hilar, or axillary lymph nodes. Thyroid gland, trachea, and esophagus demonstrate no significant findings. Lungs/Pleura: No pneumothorax is noted. Mild bibasilar subsegmental atelectasis is noted. Multiple ill-defined nodular opacities are noted  throughout both lungs with the largest measuring 13 mm in the right upper lobe. This is concerning for metastatic disease or possibly multifocal infection. Upper Abdomen: No acute abnormality. Musculoskeletal: No chest wall abnormality. No acute or significant osseous findings. Review of the MIP images confirms the above findings. IMPRESSION: No definite evidence of pulmonary embolus is noted. Multiple ill-defined nodular opacities are noted throughout both lungs, the largest measuring 13 mm in the right upper lobe. This is concerning for diffuse metastatic disease or possibly multifocal pneumonia. Clinical correlation and short-term follow-up CT scan may be performed. Mild bibasilar subsegmental atelectasis is noted. Aortic Atherosclerosis (ICD10-I70.0). Electronically Signed   By: Marijo Conception M.D.   On: 07/05/2021 11:44   MR PELVIS WO CONTRAST  Result Date: 07/05/2021 CLINICAL DATA:  Hip replacement, infection suspected EXAM: MRI PELVIS WITHOUT CONTRAST TECHNIQUE: Multiplanar multisequence MR imaging of the pelvis was performed. No intravenous contrast was administered. COMPARISON:  None. FINDINGS: Urinary Tract:  No abnormality visualized. Bowel:  Unremarkable visualized pelvic bowel loops. Vascular/Lymphatic: No pathologically enlarged lymph nodes. No significant vascular abnormality seen. Reproductive:  No mass or other significant abnormality Other:  None. Musculoskeletal: There are bilateral hip arthroplasties with associated susceptibility artifact. There are moderate right and trace left joint effusions. There is no visible marrow signal abnormality. Lower lumbar spine degenerative disc disease, partially visualized. There is intramuscular edema bilaterally involving the gluteus musculature and abductor muscles. 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 Moderate right and trace left joint effusions, with right-sided periprostatic fluid collection tracking  posteriorly along the greater trochanter. No marrow signal changes to suggest osteomyelitis. If there is concern for infected hip joint, aspiration should be considered. Symmetric bilateral intramuscular edema involving the gluteal musculature and adductor muscles, consistent with non-specific myositis. No evidence of pyomyositis. Electronically Signed   By: Maurine Simmering M.D.   On: 07/05/2021 16:00   DG Chest Port 1 View  Result Date: 07/05/2021 CLINICAL DATA:  Weakness and shortness of breath EXAM: PORTABLE CHEST 1 VIEW COMPARISON:  Radiograph 07/03/2021 FINDINGS: Unchanged cardiomediastinal silhouette. Nodular opacities in the lungs are better seen on recent chest CT. Bibasilar atelectasis. No large effusion. No pneumothorax. No acute osseous abnormality. IMPRESSION: Nodular opacities in the lungs are better seen on recent chest CT. Bibasilar atelectasis. Electronically Signed   By: Maurine Simmering M.D.   On: 07/05/2021 13:45   ECHOCARDIOGRAM COMPLETE  Result Date: 07/05/2021    ECHOCARDIOGRAM REPORT   Patient Name:   Joshua Cole Date of Exam: 07/04/2021 Medical Rec #:  [id number]        Height:  70.0 in Accession #:    [id number]       Weight:       249.3 lb Date of Birth:  July 20, 1961       BSA:          2.292 m Patient Age:    60 years         BP:           107/62 mmHg Patient Gender: M                HR:           70 bpm. Exam Location:  [facility] Procedure: 2D Echo, Cardiac Doppler and Color Doppler Indications:     Bacteremia R78.81  History:         Patient has no prior history of Echocardiogram examinations. No                  heart history listed in medical file.  Sonographer:     Sherrie Sport Referring Phys:  [id number] Sidney Ace Diagnosing Phys: Kathlyn Sacramento MD  Sonographer Comments: Technically challenging study due to limited acoustic windows, suboptimal apical window and suboptimal parasternal window. IMPRESSIONS  1. Left ventricular ejection fraction, by estimation, is 55 to 60%. The  left ventricle has normal function. Left ventricular endocardial border not optimally defined to evaluate regional wall motion. There is mild left ventricular hypertrophy. Left ventricular diastolic parameters are indeterminate.  2. Right ventricular systolic function is normal. The right ventricular size is normal. Tricuspid regurgitation signal is inadequate for assessing PA pressure.  3. Left atrial size was mildly dilated.  4. Right atrial size was mildly dilated.  5. The mitral valve is normal in structure. No evidence of mitral valve regurgitation. No evidence of mitral stenosis.  6. The aortic valve is normal in structure. Aortic valve regurgitation is not visualized. Mild aortic valve sclerosis is present, with no evidence of aortic valve stenosis.  7. Aortic dilatation noted. There is borderline dilatation of the aortic root and of the ascending aorta, measuring 38 mm.  8. The tricuspid was not well visualized. However, there is likely a mobile vergetation noted on the short axis images. Recommend a TEE. FINDINGS  Left Ventricle: Left ventricular ejection fraction, by estimation, is 55 to 60%. The left ventricle has normal function. Left ventricular endocardial border not optimally defined to evaluate regional wall motion. The left ventricular internal cavity size was normal in size. There is mild left ventricular hypertrophy. Left ventricular diastolic parameters are indeterminate. Right Ventricle: The right ventricular size is normal. No increase in right ventricular wall thickness. Right ventricular systolic function is normal. Tricuspid regurgitation signal is inadequate for assessing PA pressure. Left Atrium: Left atrial size was mildly dilated. Right Atrium: Right atrial size was mildly dilated. Pericardium: There is no evidence of pericardial effusion. Mitral Valve: The mitral valve is normal in structure. There is mild calcification of the mitral valve leaflet(s). No evidence of mitral valve  regurgitation. No evidence of mitral valve stenosis. Tricuspid Valve: The tricuspid valve is not well visualized. Tricuspid valve regurgitation is not demonstrated. No evidence of tricuspid stenosis. Aortic Valve: The aortic valve is normal in structure. Aortic valve regurgitation is not visualized. Mild aortic valve sclerosis is present, with no evidence of aortic valve stenosis. Aortic valve mean gradient measures 4.0 mmHg. Aortic valve peak gradient measures 7.7 mmHg. Aortic valve area, by VTI measures 3.66 cm. Pulmonic Valve: The pulmonic valve was normal in structure. Pulmonic valve  regurgitation is not visualized. No evidence of pulmonic stenosis. Aorta: Aortic dilatation noted. There is borderline dilatation of the aortic root and of the ascending aorta, measuring 38 mm. Venous: The inferior vena cava was not well visualized. IAS/Shunts: No atrial level shunt detected by color flow Doppler.  LEFT VENTRICLE PLAX 2D LVIDd:         3.70 cm   Diastology LVIDs:         2.70 cm   LV e' medial:    5.55 cm/s LV PW:         1.30 cm   LV E/e' medial:  14.4 LV IVS:        0.95 cm   LV e' lateral:   12.50 cm/s LVOT diam:     2.20 cm   LV E/e' lateral: 6.4 LV SV:         67 LV SV Index:   29 LVOT Area:     3.80 cm  RIGHT VENTRICLE RV Basal diam:  3.70 cm LEFT ATRIUM           Index        RIGHT ATRIUM           Index LA diam:      4.30 cm 1.88 cm/m   RA Area:     21.80 cm LA Vol (A4C): 69.6 ml 30.36 ml/m  RA Volume:   77.30 ml  33.72 ml/m  AORTIC VALVE                    PULMONIC VALVE AV Area (Vmax):    2.64 cm     PV Vmax:        0.81 m/s AV Area (Vmean):   3.00 cm     PV Peak grad:   2.7 mmHg AV Area (VTI):     3.66 cm     RVOT Peak grad: 4 mmHg AV Vmax:           139.00 cm/s AV Vmean:          81.200 cm/s AV VTI:            0.182 m AV Peak Grad:      7.7 mmHg AV Mean Grad:      4.0 mmHg LVOT Vmax:         96.60 cm/s LVOT Vmean:        64.000 cm/s LVOT VTI:          0.175 m LVOT/AV VTI ratio: 0.96  AORTA Ao  Root diam: 3.73 cm MITRAL VALVE               TRICUSPID VALVE MV Area (PHT): 2.30 cm    TR Peak grad:   9.4 mmHg MV Decel Time: 330 msec    TR Vmax:        153.00 cm/s MV E velocity: 79.70 cm/s MV A velocity: 90.00 cm/s  SHUNTS MV E/A ratio:  0.89        Systemic VTI:  0.18 m                            Systemic Diam: 2.20 cm Kathlyn Sacramento MD Electronically signed by Kathlyn Sacramento MD Signature Date/Time: 07/05/2021/12:50:50 PM    Final     Cardiac Studies   Echo 07/04/21  1. Left ventricular ejection fraction, by estimation, is 55 to 60%. The  left ventricle has normal function. Left ventricular endocardial  border  not optimally defined to evaluate regional wall motion. There is mild left  ventricular hypertrophy. Left  ventricular diastolic parameters are indeterminate.   2. Right ventricular systolic function is normal. The right ventricular  size is normal. Tricuspid regurgitation signal is inadequate for assessing  PA pressure.   3. Left atrial size was mildly dilated.   4. Right atrial size was mildly dilated.   5. The mitral valve is normal in structure. No evidence of mitral valve  regurgitation. No evidence of mitral stenosis.   6. The aortic valve is normal in structure. Aortic valve regurgitation is  not visualized. Mild aortic valve sclerosis is present, with no evidence  of aortic valve stenosis.   7. Aortic dilatation noted. There is borderline dilatation of the aortic  root and of the ascending aorta, measuring 38 mm.   8. The tricuspid was not well visualized. However, there is likely a  mobile vergetation noted on the short axis images. Recommend a TEE.   Patient Profile     60 y.o. male with a hx of gout, alcohol use, and joint pain who is being seen 07/05/2021 for the evaluation of valvular vegetation.  Assessment & Plan    Valvular vegetation SEPSIS/bacteremia AMS - presented with fever and chills found to have leukocytosis and tachycardia - IV abx per IM/ ID  following - Blood cultures showed MSSA bacteremia - mental status improving.  - Patient became SOB and IVF stopped.  - CTA chest negative for PE, but showed possible septic emboli. HE is on 2-3 L O2. - Echo showed LVEF 55-60%, mild LVH, mild aortic sclerosis, aortic dilation 20mm, possible vegetation on tricuspid valve - Needs TEE. Plan for tomorrow as patient ate this morning.    Respiratory failure - CTA chest negative for PE concerning for multiple ill-defined lesions, possible septic emboli - He has remote smoking history - IVF held  - BNP 219 - echo showed LVEF 55-60%   Hyponatremia - suspected secondary to alcohol use and poor oral intake - NA 125 today - per IM   Thrombocytopenia - suspected secondary to alcohol use   Alcohol use - cessation recommended  For questions or updates, please contact [facility] Please consult [email or web address] for contact info under        Signed, Ixel Boehning Ninfa Meeker, PA-C  07/06/2021, 9:53 AM

## 2021-07-06 NOTE — Progress Notes (Signed)
PROGRESS NOTE    Joshua Cole  KPT:[id number] DOB: 11/17/1960 DOA: 07/03/2021 PCP: Pcp, No    Brief Narrative:  60 y.o. male with medical history significant of gout, alcohol use, who presents with generalized weakness, multiple joints pain, confusion, nausea, vomiting, diarrhea, fever.   Per his fiance (I called her fianc by phone), patient received flu shot and COVID-vaccine booster on Friday.  In Saturday night, patient had fever of 102 and chills.  He complains of multiple joint pain, including right hand, left shoulder, bilateral hip and lower back.  He has nausea, vomiting and few times of watery diarrhea, which has resolved per patient.  Patient has mild dry cough, mild shortness of breath, denies chest pain. Patient was found to be intermittently confused.  When saw patient in the ED, patient is mildly confused, but is still oriented x3.  He moves all extremities.  No facial droop or slurred speech.  Denies symptoms of UTI.  Her fianc states that patient was seen in urgent care and was tested negative for flu and COVID, but started him on Tamiflu and steroid on 10/20.  Blood cultures positive for MSSA.  Pharmacy contacted attending provider who added IV cefazolin.  Infectious disease on consult.  Patient remains hemodynamically stable.  Mental status improving.  Desaturation event on 10/24.  Follow-up CT angiography negative for PE.  Is concerning for multiple ill-defined lesions, suspect septic emboli.  Patient also endorsing pain in bilateral hips.    Patient was scheduled for TEE on 10/25 however unfortunately he ate breakfast.  TEE canceled for today and will be rescheduled for 10/26.     Assessment & Plan:   Principal Problem:   Hyponatremia Active Problems:   Acute metabolic encephalopathy   Joint pain   Generalized weakness   SIRS (systemic inflammatory response syndrome) (HCC)   Hypokalemia   Hyperglycemia   Thrombocytopenia (HCC)   Nausea vomiting and  diarrhea   Gout  MSSA bacteremia Sepsis secondary to above Sepsis criteria met with leukocytosis and tachycardia Source of bacteremia is unclear Patient was started on Ancef Sepsis physiology improving TTE with possible tricuspid valve vegetation Cardiology consulted with plans for TEE Patient was complaining of pain in bilateral hips.  MRI demonstrates nonspecific myositis with bilateral joint effusions right greater than left Plan: Continue Ancef IV fluids stopped TEE tomorrow 10/26 as patient ate breakfast today Follow surveillance cultures, no growth to date Infectious disease follow-up If TEE is positive patient may require transfer to higher level of care for cardiothoracic surgery evaluation Orthopedic consultation requested for evaluation of moderate joint effusion consideration for arthrocentesis  Acute hypoxic respiratory failure Patient had desaturation event on 10/24 Oxygen saturation on room air mid 80s Recovered to mid 90s with 4 L nasal cannula CT angiography negative for PE Concerning for multiple ill-defined lesions, possible septic emboli Fluid overload on differential Plan: No further IV fluids No Lasix  cardiology to perform TEE 17/00  Acute metabolic encephalopathy Etiology not entirely clear TTP ruled out after oncology reviewed peripheral smear MRI reassuring Low suspicion for intracranial thrombosis related to COVID-vaccine Mental status seems to be improving Suspect related to bacteremia Plan: Frequent neurochecks  New diagnosis diabetes mellitus with hyperglycemia Hemoglobin A1c 7.8 Patient does not see physicians and is not on any diabetic medication Plan: Basal bolus regimen with Semglee and NovoLog Carb modified diet Diabetes coordinator consult, recommendations appreciated  Diffuse joint pain Generalized weakness Possible exacerbation of gout, felt unlikely Would be atypical presentation Has been on steroids  since 10/20 Uric acid  normal Plan: Steroids stopped Discontinue Toradol in the setting of thrombocytopenia As needed narcotics for breakthrough pain Therapy evaluations as able Orthopedic consult, consider arthrocentesis  Thrombocytopenia Platelets and 60s, no active bleeding Suspect secondary to alcohol intake TTP unlikely due to lack of schistocytes on peripheral smear Appreciate oncology follow-up  Hyponatremia Likely secondary to alcohol abuse and poor oral intake Fluid restrict  Hypokalemia Monitor and replace potassium as necessary  Hyperglycemia Likely related to recent steroid use  Intractable nausea vomiting Diarrhea Resolved    DVT prophylaxis: SCD Code Status: Full Family Communication: Garen Grams [phone].  At bedside 10/24 Disposition Plan: Status is: Inpatient  Remains inpatient appropriate because: Sepsis, MSSA bacteremia, acute metabolic encephalopathy.  Disposition plan pending.  Not medically ready for discharge at this time.  Will need TEE.  Possible surgical intervention depending on TEE results       Level of care: Med-Surg  Consultants:  ID Heme-onc Cardiology  Procedures:  None  Antimicrobials: Cefazolin   Subjective: Seen and examined.  Remains alert and oriented x3.  Mental status improving.  Continues to endorse severe weakness and joint pain diffusely Objective: Vitals:   07/05/21 1758 07/05/21 2009 07/06/21 0709 07/06/21 1039  BP: (!) 115/59 102/77 (!) 100/57   Pulse: 72 61 (!) 56   Resp: '18 16 20   ' Temp: 98.9 F (37.2 C) 98 F (36.7 C) 99.3 F (37.4 C)   TempSrc: Oral  Oral   SpO2: 97% 98% 98% 95%  Weight:      Height:        Intake/Output Summary (Last 24 hours) at 07/06/2021 1129 Last data filed at 07/06/2021 0500 Gross per 24 hour  Intake 744.67 ml  Output 1450 ml  Net -705.33 ml   Filed Weights   07/03/21 0954 07/03/21 1145  Weight: 105.2 kg 113.1 kg    Examination:  General exam: No acute distress.   Fatigued Respiratory system: Bibasilar crackles.  Normal work of breathing.  2 L Cardiovascular system: S1-S2, RRR, no murmurs, no pedal edema Gastrointestinal system: Soft, NT/ND, normal bowel sounds Central nervous system: Alert and oriented. No focal neurological deficits.  Lethargic Extremities: Decreased range of motion and power upper extremities.  Greater on right.  Gait not assessed.  Decreased power bilateral lower extremities Skin: No rashes, lesions or ulcers Psychiatry: Judgement and insight appear impaired. Mood & affect flattened.     Data Reviewed: I have personally reviewed following labs and imaging studies  CBC: Recent Labs  Lab 07/03/21 0959 07/03/21 1655 07/04/21 0506 07/05/21 0747 07/06/21 0814  WBC 17.4* 13.1* 16.2* 15.9* 13.8*  NEUTROABS  --  11.5*  --  13.3* 11.5*  HGB 14.8 15.1 14.3 12.6* 12.6*  HCT 39.5 41.9 38.7* 34.6* 35.2*  MCV 88.6 91.1 94.2 94.0 93.4  PLT 68* 67* 59* 71* 57*   Basic Metabolic Panel: Recent Labs  Lab 07/03/21 1655 07/03/21 2301 07/04/21 0506 07/05/21 0747 07/06/21 0814  NA 124* 122* 128* 127* 125*  K 3.4* 4.1 4.1 3.7 3.4*  CL 85* 87* 94* 92* 91*  CO2 '27 24 27 27 25  ' GLUCOSE 267* 312* 281* 282* 230*  BUN 31* 32* 29* 30* 36*  CREATININE 1.19 0.99 0.91 0.87 1.04  CALCIUM 8.1* 7.8* 7.8* 7.8* 7.5*  MG 2.2  --   --   --   --   PHOS 3.4  --   --   --   --    GFR: Estimated Creatinine Clearance:  97.5 mL/min (by C-G formula based on SCr of 1.04 mg/dL). Liver Function Tests: No results for input(s): AST, ALT, ALKPHOS, BILITOT, PROT, ALBUMIN in the last 168 hours. No results for input(s): LIPASE, AMYLASE in the last 168 hours. No results for input(s): AMMONIA in the last 168 hours. Coagulation Profile: Recent Labs  Lab 07/03/21 1655 07/04/21 0506  INR 1.1 1.2   Cardiac Enzymes: No results for input(s): CKTOTAL, CKMB, CKMBINDEX, TROPONINI in the last 168 hours. BNP (last 3 results) No results for input(s): PROBNP in the  last 8760 hours. HbA1C: Recent Labs    07/03/21 1655 07/04/21 1809  HGBA1C 7.8* 8.0*   CBG: Recent Labs  Lab 07/05/21 1249 07/05/21 1652 07/05/21 2203 07/06/21 0754 07/06/21 1103  GLUCAP 248* 179* 344* 239* 173*   Lipid Profile: No results for input(s): CHOL, HDL, LDLCALC, TRIG, CHOLHDL, LDLDIRECT in the last 72 hours. Thyroid Function Tests: No results for input(s): TSH, T4TOTAL, FREET4, T3FREE, THYROIDAB in the last 72 hours. Anemia Panel: No results for input(s): VITAMINB12, FOLATE, FERRITIN, TIBC, IRON, RETICCTPCT in the last 72 hours. Sepsis Labs: No results for input(s): PROCALCITON, LATICACIDVEN in the last 168 hours.  Recent Results (from the past 240 hour(s))  Resp Panel by RT-PCR (Flu A&B, Covid) Nasopharyngeal Swab     Status: None   Collection Time: 07/03/21 12:08 PM   Specimen: Nasopharyngeal Swab; Nasopharyngeal(NP) swabs in vial transport medium  Result Value Ref Range Status   SARS Coronavirus 2 by RT PCR NEGATIVE NEGATIVE Final    Comment: (NOTE) SARS-CoV-2 target nucleic acids are NOT DETECTED.  The SARS-CoV-2 RNA is generally detectable in upper respiratory specimens during the acute phase of infection. The lowest concentration of SARS-CoV-2 viral copies this assay can detect is 138 copies/mL. A negative result does not preclude SARS-Cov-2 infection and should not be used as the sole basis for treatment or other patient management decisions. A negative result may occur with  improper specimen collection/handling, submission of specimen other than nasopharyngeal swab, presence of viral mutation(s) within the areas targeted by this assay, and inadequate number of viral copies(<138 copies/mL). A negative result must be combined with clinical observations, patient history, and epidemiological information. The expected result is Negative.  Fact Sheet for Patients:  EntrepreneurPulse.com.au  Fact Sheet for Healthcare Providers:   IncredibleEmployment.be  This test is no t yet approved or cleared by the [place] FDA and  has been authorized for detection and/or diagnosis of SARS-CoV-2 by FDA under an Emergency Use Authorization (EUA). This EUA will remain  in effect (meaning this test can be used) for the duration of the COVID-19 declaration under Section 564(b)(1) of the Act, 21 U.S.C.section 360bbb-3(b)(1), unless the authorization is terminated  or revoked sooner.       Influenza A by PCR NEGATIVE NEGATIVE Final   Influenza B by PCR NEGATIVE NEGATIVE Final    Comment: (NOTE) The Xpert Xpress SARS-CoV-2/FLU/RSV plus assay is intended as an aid in the diagnosis of influenza from Nasopharyngeal swab specimens and should not be used as a sole basis for treatment. Nasal washings and aspirates are unacceptable for Xpert Xpress SARS-CoV-2/FLU/RSV testing.  Fact Sheet for Patients: EntrepreneurPulse.com.au  Fact Sheet for Healthcare Providers: IncredibleEmployment.be  This test is not yet approved or cleared by the [place] FDA and has been authorized for detection and/or diagnosis of SARS-CoV-2 by FDA under an Emergency Use Authorization (EUA). This EUA will remain in effect (meaning this test can be used) for the duration of the COVID-19  declaration under Section 564(b)(1) of the Act, 21 U.S.C. section 360bbb-3(b)(1), unless the authorization is terminated or revoked.  Performed at [facility], [address] [place], [place] [address]   Urine Culture     Status: Abnormal   Collection Time: 07/03/21  3:09 PM   Specimen: Urine, Random  Result Value Ref Range Status   Specimen Description   Final    URINE, RANDOM Performed at [facility], [address], [place], [place] [address]    Special Requests   Final    NONE Performed at [facility], [address] [place], [place] [address]    Culture  MULTIPLE SPECIES PRESENT, SUGGEST RECOLLECTION (A)  Final   Report Status 07/05/2021 FINAL  Final  Culture, blood (x 2)     Status: Abnormal   Collection Time: 07/03/21  4:55 PM   Specimen: BLOOD  Result Value Ref Range Status   Specimen Description   Final    BLOOD LEFT ANTECUBITAL Performed at [facility], [address], [place], [place] [address]    Special Requests   Final    BOTTLES DRAWN AEROBIC AND ANAEROBIC Blood Culture results may not be optimal due to an excessive volume of blood received in culture bottles Performed at [facility], [address] [place], [place] [address]    Culture  Setup Time   Final    IN BOTH AEROBIC AND ANAEROBIC BOTTLES GRAM POSITIVE COCCI CRITICAL RESULT CALLED TO, READ BACK BY AND VERIFIED WITH: NATHAN BELUE AT 2122 07/04/21.PMF Performed at [facility], [place] [address], [place], [place] [address]    Culture STAPHYLOCOCCUS AUREUS (A)  Final   Report Status 07/06/2021 FINAL  Final   Organism ID, Bacteria STAPHYLOCOCCUS AUREUS  Final      Susceptibility   Staphylococcus aureus - MIC*    CIPROFLOXACIN <=0.5 SENSITIVE Sensitive     ERYTHROMYCIN <=0.25 SENSITIVE Sensitive     GENTAMICIN <=0.5 SENSITIVE Sensitive     OXACILLIN <=0.25 SENSITIVE Sensitive     TETRACYCLINE <=1 SENSITIVE Sensitive     VANCOMYCIN <=0.5 SENSITIVE Sensitive     TRIMETH/SULFA <=10 SENSITIVE Sensitive     CLINDAMYCIN <=0.25 SENSITIVE Sensitive     RIFAMPIN <=0.5 SENSITIVE Sensitive     Inducible Clindamycin NEGATIVE Sensitive     * STAPHYLOCOCCUS AUREUS  Blood Culture ID Panel (Reflexed)     Status: Abnormal   Collection Time: 07/03/21  4:55 PM  Result Value Ref Range Status   Enterococcus faecalis NOT DETECTED NOT DETECTED Final   Enterococcus Faecium NOT DETECTED NOT DETECTED Final   Listeria monocytogenes NOT DETECTED NOT DETECTED Final   Staphylococcus species DETECTED (A) NOT DETECTED Final    Comment: CRITICAL RESULT CALLED TO, READ  BACK BY AND VERIFIED WITH: NATHAN BELUE AT 0370 07/04/21.PMF    Staphylococcus aureus (BCID) DETECTED (A) NOT DETECTED Final    Comment: CRITICAL RESULT CALLED TO, READ BACK BY AND VERIFIED WITH: NATHAN BELUE AT 4888 07/04/21.PMF    Staphylococcus epidermidis NOT DETECTED NOT DETECTED Final   Staphylococcus lugdunensis NOT DETECTED NOT DETECTED Final   Streptococcus species NOT DETECTED NOT DETECTED Final   Streptococcus agalactiae NOT DETECTED NOT DETECTED Final   Streptococcus pneumoniae NOT DETECTED NOT DETECTED Final   Streptococcus pyogenes NOT DETECTED NOT DETECTED Final   A.calcoaceticus-baumannii NOT DETECTED NOT DETECTED Final   Bacteroides fragilis NOT DETECTED NOT DETECTED Final   Enterobacterales NOT DETECTED NOT DETECTED Final   Enterobacter cloacae complex  NOT DETECTED NOT DETECTED Final   Escherichia coli NOT DETECTED NOT DETECTED Final   Klebsiella aerogenes NOT DETECTED NOT DETECTED Final   Klebsiella oxytoca NOT DETECTED NOT DETECTED Final   Klebsiella pneumoniae NOT DETECTED NOT DETECTED Final   Proteus species NOT DETECTED NOT DETECTED Final   Salmonella species NOT DETECTED NOT DETECTED Final   Serratia marcescens NOT DETECTED NOT DETECTED Final   Haemophilus influenzae NOT DETECTED NOT DETECTED Final   Neisseria meningitidis NOT DETECTED NOT DETECTED Final   Pseudomonas aeruginosa NOT DETECTED NOT DETECTED Final   Stenotrophomonas maltophilia NOT DETECTED NOT DETECTED Final   Candida albicans NOT DETECTED NOT DETECTED Final   Candida auris NOT DETECTED NOT DETECTED Final   Candida glabrata NOT DETECTED NOT DETECTED Final   Candida krusei NOT DETECTED NOT DETECTED Final   Candida parapsilosis NOT DETECTED NOT DETECTED Final   Candida tropicalis NOT DETECTED NOT DETECTED Final   Cryptococcus neoformans/gattii NOT DETECTED NOT DETECTED Final   Meth resistant mecA/C and MREJ NOT DETECTED NOT DETECTED Final    Comment: Performed at [facility], [address] [place], [place] [address]  Culture, blood (x 2)     Status: Abnormal   Collection Time: 07/03/21  6:37 PM   Specimen: BLOOD  Result Value Ref Range Status   Specimen Description   Final    BLOOD RIGHT HAND Performed at [facility], [address], [place], [place] [address]    Special Requests   Final    BOTTLES DRAWN AEROBIC AND ANAEROBIC Blood Culture adequate volume Performed at [facility], [address] [place], [place] [address]    Culture  Setup Time   Final    GRAM POSITIVE COCCI IN BOTH AEROBIC AND ANAEROBIC BOTTLES CRITICAL RESULT CALLED TO, READ BACK BY AND VERIFIED WITH: NATHAN BELUE AT 1601 07/04/21.PMF Performed at [facility], [address] [place], [place] [address]    Culture (A)  Final    STAPHYLOCOCCUS AUREUS SUSCEPTIBILITIES PERFORMED ON PREVIOUS CULTURE WITHIN THE LAST 5 DAYS. Performed at [facility], [place] [address], [place], [place] [address]    Report Status 07/06/2021 FINAL  Final  CULTURE, BLOOD (ROUTINE X 2) w Reflex to ID Panel     Status: None (Preliminary result)   Collection Time: 07/05/21  7:47 AM   Specimen: BLOOD  Result Value Ref Range Status   Specimen Description BLOOD RIGHT WRIST  Final   Special Requests   Final    BOTTLES DRAWN AEROBIC AND ANAEROBIC Blood Culture adequate volume   Culture   Final    NO GROWTH < 24 HOURS Performed at [facility], [address], [place], [place] [address]    Report Status PENDING  Incomplete  CULTURE, BLOOD (ROUTINE X 2) w Reflex to ID Panel     Status: None (Preliminary result)   Collection Time: 07/05/21  7:47 AM   Specimen: BLOOD  Result Value Ref Range Status   Specimen Description BLOOD BLOOD RIGHT HAND  Final   Special Requests   Final    BOTTLES DRAWN AEROBIC ONLY Blood Culture adequate volume   Culture   Final    NO GROWTH < 24 HOURS Performed at [facility], [address], [place], [place] [address]    Report  Status PENDING  Incomplete         Radiology Studies: CT Angio Chest Pulmonary Embolism (PE) W or WO Contrast  Result Date: 07/05/2021 CLINICAL DATA:  Positive D-dimer level. EXAM: CT ANGIOGRAPHY CHEST WITH CONTRAST TECHNIQUE: Multidetector CT imaging of the chest was performed using the standard protocol during bolus administration of intravenous contrast. Multiplanar CT image reconstructions and MIPs were obtained to evaluate the vascular anatomy. CONTRAST:  31m OMNIPAQUE IOHEXOL 350 MG/ML SOLN COMPARISON:  None. FINDINGS: Cardiovascular: Satisfactory opacification of the pulmonary arteries to the segmental level. No evidence of pulmonary embolism. Normal heart size. No pericardial effusion. Atherosclerosis of thoracic aorta is noted without aneurysm formation. Mediastinum/Nodes: No enlarged mediastinal, hilar, or axillary lymph nodes. Thyroid gland, trachea, and esophagus demonstrate no significant findings. Lungs/Pleura: No pneumothorax is noted. Mild bibasilar subsegmental atelectasis is noted. Multiple ill-defined nodular opacities are noted throughout both lungs with the largest measuring 13 mm in the right upper lobe. This is concerning for metastatic disease or possibly multifocal infection. Upper Abdomen: No acute abnormality. Musculoskeletal: No chest wall abnormality. No acute or significant osseous findings. Review of the MIP images confirms the above findings. IMPRESSION: No definite evidence of pulmonary embolus is noted. Multiple ill-defined nodular opacities are noted throughout both lungs, the largest measuring 13 mm in the right upper lobe. This is concerning for diffuse metastatic disease or possibly multifocal pneumonia. Clinical correlation and short-term follow-up CT scan may be performed. Mild bibasilar subsegmental atelectasis is noted. Aortic Atherosclerosis (ICD10-I70.0). Electronically Signed   By: JMarijo ConceptionM.D.   On: 07/05/2021 11:44   MR PELVIS WO CONTRAST  Result  Date: 07/05/2021 CLINICAL DATA:  Hip replacement, infection suspected EXAM: MRI PELVIS WITHOUT CONTRAST TECHNIQUE: Multiplanar multisequence MR imaging of the pelvis was performed. No intravenous contrast was administered. COMPARISON:  None. FINDINGS: Urinary Tract:  No abnormality visualized. Bowel:  Unremarkable visualized pelvic bowel loops. Vascular/Lymphatic: No pathologically enlarged lymph nodes. No significant vascular abnormality seen. Reproductive:  No mass or other significant abnormality Other:  None. Musculoskeletal: There are bilateral hip arthroplasties with associated susceptibility artifact. There are moderate right and trace left joint effusions. There is no visible marrow signal abnormality. Lower lumbar spine degenerative disc disease, partially visualized. There is intramuscular edema bilaterally involving the gluteus musculature and abductor muscles. 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 Moderate right and trace left joint effusions, with right-sided periprostatic fluid collection tracking posteriorly along the greater trochanter. No marrow signal changes to suggest osteomyelitis. If there is concern for infected hip joint, aspiration should be considered. Symmetric bilateral intramuscular edema involving the gluteal musculature and adductor muscles, consistent with non-specific myositis. No evidence of pyomyositis. Electronically Signed   By: JMaurine SimmeringM.D.   On: 07/05/2021 16:00   DG Chest Port 1 View  Result Date: 07/05/2021 CLINICAL DATA:  Weakness and shortness of breath EXAM: PORTABLE CHEST 1 VIEW COMPARISON:  Radiograph 07/03/2021 FINDINGS: Unchanged cardiomediastinal silhouette. Nodular opacities in the lungs are better seen on recent chest CT. Bibasilar atelectasis. No large effusion. No pneumothorax. No acute osseous abnormality. IMPRESSION: Nodular opacities in the lungs are better seen on recent chest CT. Bibasilar atelectasis.  Electronically Signed   By: JMaurine SimmeringM.D.   On: 07/05/2021 13:45   ECHOCARDIOGRAM COMPLETE  Result Date: 07/05/2021    ECHOCARDIOGRAM REPORT   Patient Name:   GKEITHAN DILEONARDODate of Exam: 07/04/2021 Medical Rec #:  [id number]       Height:       70.0 in Accession #:    [id number]      Weight:       249.3 lb Date of Birth:  116-Mar-1962  BSA:          2.292 m Patient Age:    60 years         BP:           107/62 mmHg Patient Gender: M                HR:           70 bpm. Exam Location:  [facility] Procedure: 2D Echo, Cardiac Doppler and Color Doppler Indications:     Bacteremia R78.81  History:         Patient has no prior history of Echocardiogram examinations. No                  heart history listed in medical file.  Sonographer:     Sherrie Sport Referring Phys:  [id number] Sidney Ace Diagnosing Phys: Kathlyn Sacramento MD  Sonographer Comments: Technically challenging study due to limited acoustic windows, suboptimal apical window and suboptimal parasternal window. IMPRESSIONS  1. Left ventricular ejection fraction, by estimation, is 55 to 60%. The left ventricle has normal function. Left ventricular endocardial border not optimally defined to evaluate regional wall motion. There is mild left ventricular hypertrophy. Left ventricular diastolic parameters are indeterminate.  2. Right ventricular systolic function is normal. The right ventricular size is normal. Tricuspid regurgitation signal is inadequate for assessing PA pressure.  3. Left atrial size was mildly dilated.  4. Right atrial size was mildly dilated.  5. The mitral valve is normal in structure. No evidence of mitral valve regurgitation. No evidence of mitral stenosis.  6. The aortic valve is normal in structure. Aortic valve regurgitation is not visualized. Mild aortic valve sclerosis is present, with no evidence of aortic valve stenosis.  7. Aortic dilatation noted. There is borderline dilatation of the aortic root and of the ascending aorta,  measuring 38 mm.  8. The tricuspid was not well visualized. However, there is likely a mobile vergetation noted on the short axis images. Recommend a TEE. FINDINGS  Left Ventricle: Left ventricular ejection fraction, by estimation, is 55 to 60%. The left ventricle has normal function. Left ventricular endocardial border not optimally defined to evaluate regional wall motion. The left ventricular internal cavity size was normal in size. There is mild left ventricular hypertrophy. Left ventricular diastolic parameters are indeterminate. Right Ventricle: The right ventricular size is normal. No increase in right ventricular wall thickness. Right ventricular systolic function is normal. Tricuspid regurgitation signal is inadequate for assessing PA pressure. Left Atrium: Left atrial size was mildly dilated. Right Atrium: Right atrial size was mildly dilated. Pericardium: There is no evidence of pericardial effusion. Mitral Valve: The mitral valve is normal in structure. There is mild calcification of the mitral valve leaflet(s). No evidence of mitral valve regurgitation. No evidence of mitral valve stenosis. Tricuspid Valve: The tricuspid valve is not well visualized. Tricuspid valve regurgitation is not demonstrated. No evidence of tricuspid stenosis. Aortic Valve: The aortic valve is normal in structure. Aortic valve regurgitation is not visualized. Mild aortic valve sclerosis is present, with no evidence of aortic valve stenosis. Aortic valve mean gradient measures 4.0 mmHg. Aortic valve peak gradient measures 7.7 mmHg. Aortic valve area, by VTI measures 3.66 cm. Pulmonic Valve: The pulmonic valve was normal in structure. Pulmonic valve regurgitation is not visualized. No evidence of pulmonic stenosis. Aorta: Aortic dilatation noted. There is borderline dilatation of the aortic root and of the ascending aorta, measuring 38 mm. Venous: The inferior vena cava was  not well visualized. IAS/Shunts: No atrial level shunt  detected by color flow Doppler.  LEFT VENTRICLE PLAX 2D LVIDd:         3.70 cm   Diastology LVIDs:         2.70 cm   LV e' medial:    5.55 cm/s LV PW:         1.30 cm   LV E/e' medial:  14.4 LV IVS:        0.95 cm   LV e' lateral:   12.50 cm/s LVOT diam:     2.20 cm   LV E/e' lateral: 6.4 LV SV:         67 LV SV Index:   29 LVOT Area:     3.80 cm  RIGHT VENTRICLE RV Basal diam:  3.70 cm LEFT ATRIUM           Index        RIGHT ATRIUM           Index LA diam:      4.30 cm 1.88 cm/m   RA Area:     21.80 cm LA Vol (A4C): 69.6 ml 30.36 ml/m  RA Volume:   77.30 ml  33.72 ml/m  AORTIC VALVE                    PULMONIC VALVE AV Area (Vmax):    2.64 cm     PV Vmax:        0.81 m/s AV Area (Vmean):   3.00 cm     PV Peak grad:   2.7 mmHg AV Area (VTI):     3.66 cm     RVOT Peak grad: 4 mmHg AV Vmax:           139.00 cm/s AV Vmean:          81.200 cm/s AV VTI:            0.182 m AV Peak Grad:      7.7 mmHg AV Mean Grad:      4.0 mmHg LVOT Vmax:         96.60 cm/s LVOT Vmean:        64.000 cm/s LVOT VTI:          0.175 m LVOT/AV VTI ratio: 0.96  AORTA Ao Root diam: 3.73 cm MITRAL VALVE               TRICUSPID VALVE MV Area (PHT): 2.30 cm    TR Peak grad:   9.4 mmHg MV Decel Time: 330 msec    TR Vmax:        153.00 cm/s MV E velocity: 79.70 cm/s MV A velocity: 90.00 cm/s  SHUNTS MV E/A ratio:  0.89        Systemic VTI:  0.18 m                            Systemic Diam: 2.20 cm Kathlyn Sacramento MD Electronically signed by Kathlyn Sacramento MD Signature Date/Time: 07/05/2021/12:50:50 PM    Final         Scheduled Meds:  insulin aspart  0-20 Units Subcutaneous TID WC   insulin aspart  0-5 Units Subcutaneous QHS   insulin aspart  6 Units Subcutaneous TID WC   insulin glargine-yfgn  15 Units Subcutaneous Once   [START ON 07/07/2021] insulin glargine-yfgn  23 Units Subcutaneous QHS   ketorolac  15 mg Intravenous Q6H  Continuous Infusions:  sodium chloride 20 mL/hr at 07/06/21 0715    ceFAZolin (ANCEF) IV 2 g  (07/06/21 0620)     LOS: 3 days    Time spent: 25 minutes    Sidney Ace, MD [facility]   If 7PM-7AM, please contact night-coverage  07/06/2021, 11:29 AM

## 2021-07-06 NOTE — Progress Notes (Signed)
Initial Nutrition Assessment  DOCUMENTATION CODES:   Obesity unspecified  INTERVENTION:   -Magic cup TID with meals, each supplement provides 290 kcal and 9 grams of protein  -MVI with minerals daily  NUTRITION DIAGNOSIS:   Increased nutrient needs related to acute illness as evidenced by estimated needs.  GOAL:   Patient will meet greater than or equal to 90% of their needs  MONITOR:   PO intake, Supplement acceptance, Labs, Weight trends, Skin, I & O's  REASON FOR ASSESSMENT:   Malnutrition Screening Tool    ASSESSMENT:   Joshua Cole is a 60 y.o. male with medical history significant of gout, alcohol use, who presents with generalized weakness, multiple joints pain, confusion, nausea, vomiting, diarrhea, fever.  Pt admitted with MSSA bacteremia, hyponatremia, and acute metabolic encephalopathy.   Reviewed I/O's: -225 ml x 24 hours and -4.1 L since admission  UOP: 1.5 L x 24 hours  Pt talking on phone at time of visit. He was minimally interactive with this RD, but reports feeling better today.   Noted good meal completions, documented at 50-100%.   No wt hx available to assess at this time.   Per cardiology notes, plan for TEE tomorrow.   Noted pt with elevated Hgb A1c with no history of DM. Unsure if DM diagnosis has been confirmed and that pt has been informed of potential diagnosis. RD will defer education at this time.   Medications reviewed and include 0.9% sodiumc hloride infusion @ 20 ml/hr.   Lab Results  Component Value Date   HGBA1C 8.0 (H) 07/04/2021   PTA DM medications are none.   Labs reviewed: Na: 125, K: 3.4, CBGS: 179-344 (inpatient orders for glycemic control are 0-20 units insulin aspart TID with meals, 0-5 units insulin aspart daily at bedtiume, 6 units insulin aspart TID with meal,s and 15 uits insulin glargine-yfgn daily at bedtime).    NUTRITION - FOCUSED PHYSICAL EXAM:  Flowsheet Row Most Recent Value  Orbital Region No  depletion  Upper Arm Region No depletion  Thoracic and Lumbar Region No depletion  Buccal Region No depletion  Temple Region No depletion  Clavicle Bone Region No depletion  Clavicle and Acromion Bone Region No depletion  Scapular Bone Region No depletion  Dorsal Hand No depletion  Patellar Region No depletion  Anterior Thigh Region No depletion  Posterior Calf Region No depletion  Edema (RD Assessment) None  Hair Reviewed  Eyes Reviewed  Mouth Reviewed  Skin Reviewed  Nails Reviewed       Diet Order:   Diet Order             Diet heart healthy/carb modified Room service appropriate? Yes; Fluid consistency: Thin; Fluid restriction: 1500 mL Fluid  Diet effective now                   EDUCATION NEEDS:   Not appropriate for education at this time  Skin:  Skin Assessment: Reviewed RN Assessment  Last BM:  07/03/21  Height:   Ht Readings from Last 1 Encounters:  07/03/21 5\' 10"  (1.778 m)    Weight:   Wt Readings from Last 1 Encounters:  07/03/21 113.1 kg    Ideal Body Weight:  75.5 kg  BMI:  Body mass index is 35.77 kg/m.  Estimated Nutritional Needs:   Kcal:  2050-2250  Protein:  110-125 grams  Fluid:  1.5 L    Loistine Chance, RD, LDN, [place] Registered Dietitian II Certified Diabetes Care and Education Specialist Please  refer to [facility] for RD and/or RD on-call/weekend/after hours pager

## 2021-07-06 NOTE — Progress Notes (Addendum)
Inpatient Diabetes Program Recommendations  AACE/ADA: New Consensus Statement on Inpatient Glycemic Control   Target Ranges:  Prepandial:   less than 140 mg/dL      Peak postprandial:   less than 180 mg/dL (1-2 hours)      Critically ill patients:  140 - 180 mg/dL   Results for Joshua Cole, Joshua Cole (MRN [id number]) as of 07/06/2021 09:27  Ref. Range 07/05/2021 07:52 07/05/2021 12:49 07/05/2021 16:52 07/05/2021 22:03 07/06/2021 07:54  Glucose-Capillary Latest Ref Range: 70 - 99 mg/dL 286 (H) 248 (H) 179 (H) 344 (H) 239 (H)   Results for Joshua Cole, Joshua Cole (MRN [id number]) as of 07/06/2021 09:27  Ref. Range 07/03/2021 16:55 07/04/2021 18:09  Hemoglobin A1C Latest Ref Range: 4.8 - 5.6 % 7.8 (H) 8.0 (H)   Review of Glycemic Control  Diabetes history: No Outpatient Diabetes medications: NA Current orders for Inpatient glycemic control: Semglee 15 units QHS, Novolog 0-20 units TID with meals, Novolog 0-5 units QHS, Novolog 6 units TID with meals   Inpatient Diabetes Program Recommendations:     Insulin: Please consider increasing Semglee to 23 units QHS and meal coverage to Novolog 8 units TID with meals if patient eats at least 50% of meals.  HbgA1C:  A1C 7.8% on 07/03/21 indicating an average glucose of 177 mg/dl over the past 2-3 months. Per ADA, if A1C 6.5% or greater then mets criteria to dx with DM. Of note, patient started taking Prednisone on 07/01/21 which has likely impacted glucose and A1C results.  If patient will be newly dx with DM this admission, please inform patient and nursing staff so patient can be educated on DM.  Addendum 07/06/21@13 :29: Noted progress note today by Dr. Priscella Mann that patient is being newly dx with DM. Ordered Living Well with DM book, RD consult for diet education, and patient education by bedside RNs. Diabetes Coordinator will plan to see patient 07/07/21 to follow up with patient on new DM dx and reinforce diabetes education.  Thanks, Barnie Alderman, RN, MSN,  CDE Diabetes Coordinator Inpatient Diabetes Program [phone] (Team Pager from 8am to 5pm)

## 2021-07-06 NOTE — Progress Notes (Signed)
PT Cancellation Note  Patient Details Name: Joshua Cole MRN: [id number] DOB: Dec 08, 1960   Cancelled Treatment:    Reason Eval/Treat Not Completed: Other (comment) Pt recently transferred to chair with nursing and feeling fatigued. PT to reassess as able.   [employer], SPT

## 2021-07-06 NOTE — Progress Notes (Signed)
Occupational Therapy Treatment Patient Details Name: Joshua Cole MRN: [id number] DOB: 1961/06/22 Today's Date: 07/06/2021   History of present illness Pt is a 60 y/o M admitted on 07/03/21 with c/c of weakness, joint pain, confusion, N&V, diarrhea & fever. Pt received flu shot & covid booster on Friday. On Saturday night pt developed fever & chills. Pt was seen in Urgent care on 10/20 & tested negative for flu & covid but was started on tamiflu & steroid. Pt is currently being treated for hyponatremia & acute metabolic encephalopathy of unclear etiology. Brain MRI was ordered but was negative for acute processes. Blood cultures positive for Staph aureus. PMH: gout, alcohol use   OT comments  Pt seen for OT treatment on this date. Upon arrival to room, pt awake and seated upright in bed. Pt with improved alertness this date and agreeable to OT tx. Pt currently requires SUPERVISION for bed mobility with HOB elevated (requires MOD A with HOB flat), MIN A for seated UB dressing, and MOD for seated LB dressing d/t increased joint pain and decreased activity tolerance. Pt is making good progress towards goals and was able to take lateral steps toward head and foot of bed and 6 steps forward/backward with RW and MIN A. Following seated dressing and functional mobility of short household distances, pt reporting fatigue and rating 8/10 on RPE scale. Of note, pt required x3 seated rest breaks throughout session (SpO2 88%-92% on RA; RN aware). Pt continues to present with pain, decreased strength, and decreased activity tolerance. Due to these functional impairments, discharge recommendation remains appropriate. Will continue to follow POC.    Recommendations for follow up therapy are one component of a multi-disciplinary discharge planning process, led by the attending physician.  Recommendations may be updated based on patient status, additional functional criteria and insurance authorization.    Follow Up  Recommendations  Acute inpatient rehab (3hours/day)    Assistance Recommended at Discharge Intermittent Supervision/Assistance  Equipment Recommendations  Other (comment) (defer to next venue of care)       Precautions / Restrictions Precautions Precautions: Fall Restrictions Weight Bearing Restrictions: No       Mobility Bed Mobility Overal bed mobility: Needs Assistance Bed Mobility: Supine to Sit;Sit to Supine     Supine to sit: Supervision;HOB elevated Sit to supine: Mod assist   General bed mobility comments: With increased time/effort, pt able to perform supine>sit transfer with bed features (i.e. HOB elevated for supine>sit). Requires physical assist for LE management during sit>supine    Transfers Overall transfer level: Needs assistance Equipment used: Rolling walker (2 wheels) Transfers: Sit to/from Stand Sit to Stand: Min assist           General transfer comment: Requires verbal cues for safe hand placement with RW use     Balance Overall balance assessment: Needs assistance Sitting-balance support: Feet supported;No upper extremity supported Sitting balance-Leahy Scale: Good Sitting balance - Comments: SUPERVISION for seated UB/LB dressing   Standing balance support: Bilateral upper extremity supported;During functional activity Standing balance-Leahy Scale: Poor Standing balance comment: requires UE support on RW & min assist to take lateral steps and ~6 forward/backward steps                           ADL either performed or assessed with clinical judgement   ADL Overall ADL's : Needs assistance/impaired                 Upper Body Dressing :  Minimal assistance;Sitting Upper Body Dressing Details (indicate cue type and reason): To don hospital gown. MIN A to pull sleeves over shoulders Lower Body Dressing: Moderate assistance;Sitting/lateral leans Lower Body Dressing Details (indicate cue type and reason): MOD A to doff/don  socks; requires assistance to don in setting of decreased activity tolerance and hip pain             Functional mobility during ADLs: Minimal assistance;Rolling walker (2 wheels) (to walk 4 steps forwards/backwards)        Cognition Arousal/Alertness: Awake/alert Behavior During Therapy: WFL for tasks assessed/performed Overall Cognitive Status: Impaired/Different from baseline                                 General Comments: Pleasant & agreeable to tx. Pt with improved alertness this date, with eyes open 100% of session. Pt A&Ox3. Requires increased processing time                General Comments HR 60s-80s throughout session. While on RA, SpO2 88%-92% during seated ADLs and following functional mobility; RN informed    Pertinent Vitals/ Pain       Pain Assessment: 0-10 Pain Score: 7  Pain Location: B hips, shoulders, & finger joints Pain Descriptors / Indicators: Aching Pain Intervention(s): Limited activity within patient's tolerance;Monitored during session;Repositioned         Frequency  Min 1X/week        Progress Toward Goals  OT Goals(current goals can now be found in the care plan section)  Progress towards OT goals: Progressing toward goals  Acute Rehab OT Goals OT Goal Formulation: With patient Time For Goal Achievement: 07/18/21 Potential to Achieve Goals: [place] Discharge plan remains appropriate;Frequency remains appropriate       AM-PAC OT "6 Clicks" Daily Activity     Outcome Measure   Help from another person eating meals?: None Help from another person taking care of personal grooming?: A Little Help from another person toileting, which includes using toliet, bedpan, or urinal?: A Lot Help from another person bathing (including washing, rinsing, drying)?: A Lot Help from another person to put on and taking off regular upper body clothing?: A Little Help from another person to put on and taking off regular lower body  clothing?: A Lot 6 Click Score: 16    End of Session Equipment Utilized During Treatment: Gait belt;Rolling walker (2 wheels)  OT Visit Diagnosis: Muscle weakness (generalized) (M62.81);Pain Pain - Right/Left: Right (both) Pain - part of body: Shoulder;Hip;Knee   Activity Tolerance Patient tolerated treatment well   Patient Left in bed;with call bell/phone within reach;with bed alarm set   Nurse Communication Mobility status;Other (comment) (vitals during mobilty)        Time: 2010-0712 OT Time Calculation (min): 30 min  Charges: OT General Charges $OT Visit: 1 Visit OT Treatments $Self Care/Home Management : 8-22 mins $Therapeutic Activity: 8-22 mins  Fredirick Maudlin, OTR/L [place]

## 2021-07-07 ENCOUNTER — Encounter
Admission: EM | Disposition: A | Discharge status: Other Acute Inpatient Hospital | Payer: Self-pay | Source: Home / Self Care | Attending: Internal Medicine

## 2021-07-07 ENCOUNTER — Inpatient Hospital Stay (HOSPITAL_COMMUNITY)
Admit: 2021-07-07 | Discharge: 2021-07-07 | Disposition: A | Discharge status: Home / Self Care | Payer: Self-pay | Attending: Medical | Admitting: Medical

## 2021-07-07 ENCOUNTER — Other Ambulatory Visit: Payer: Self-pay

## 2021-07-07 DIAGNOSIS — I079 Rheumatic tricuspid valve disease, unspecified: Secondary | ICD-10-CM

## 2021-07-07 DIAGNOSIS — R7881 Bacteremia: Secondary | ICD-10-CM

## 2021-07-07 DIAGNOSIS — I459 Conduction disorder, unspecified: Secondary | ICD-10-CM

## 2021-07-07 DIAGNOSIS — I330 Acute and subacute infective endocarditis: Secondary | ICD-10-CM

## 2021-07-07 DIAGNOSIS — I340 Nonrheumatic mitral (valve) insufficiency: Secondary | ICD-10-CM

## 2021-07-07 HISTORY — PX: TEE WITHOUT CARDIOVERSION: SHX5443

## 2021-07-07 LAB — CBC WITH DIFFERENTIAL/PLATELET
Abs Immature Granulocytes: 0.28 10*3/uL — ABNORMAL HIGH (ref 0.00–0.07)
Basophils Absolute: 0 10*3/uL (ref 0.0–0.1)
Basophils Relative: 0 %
Eosinophils Absolute: 0 10*3/uL (ref 0.0–0.5)
Eosinophils Relative: 0 %
HCT: 32.4 % — ABNORMAL LOW (ref 39.0–52.0)
Hemoglobin: 11.9 g/dL — ABNORMAL LOW (ref 13.0–17.0)
Immature Granulocytes: 2 %
Lymphocytes Relative: 7 %
Lymphs Abs: 1 10*3/uL (ref 0.7–4.0)
MCH: 33.9 pg (ref 26.0–34.0)
MCHC: 36.7 g/dL — ABNORMAL HIGH (ref 30.0–36.0)
MCV: 92.3 fL (ref 80.0–100.0)
Monocytes Absolute: 1.2 10*3/uL — ABNORMAL HIGH (ref 0.1–1.0)
Monocytes Relative: 8 %
Neutro Abs: 11.9 10*3/uL — ABNORMAL HIGH (ref 1.7–7.7)
Neutrophils Relative %: 83 %
Platelets: 78 10*3/uL — ABNORMAL LOW (ref 150–400)
RBC: 3.51 MIL/uL — ABNORMAL LOW (ref 4.22–5.81)
RDW: 12.5 % (ref 11.5–15.5)
Smear Review: NORMAL
WBC: 14.4 10*3/uL — ABNORMAL HIGH (ref 4.0–10.5)
nRBC: 0 % (ref 0.0–0.2)

## 2021-07-07 LAB — BASIC METABOLIC PANEL
Anion gap: 8 (ref 5–15)
BUN: 27 mg/dL — ABNORMAL HIGH (ref 6–20)
CO2: 26 mmol/L (ref 22–32)
Calcium: 7.6 mg/dL — ABNORMAL LOW (ref 8.9–10.3)
Chloride: 90 mmol/L — ABNORMAL LOW (ref 98–111)
Creatinine, Ser: 0.91 mg/dL (ref 0.61–1.24)
GFR, Estimated: >60 mL/min (ref >60)
Glucose, Bld: 235 mg/dL — ABNORMAL HIGH (ref 70–99)
Potassium: 3.8 mmol/L (ref 3.5–5.1)
Sodium: 124 mmol/L — ABNORMAL LOW (ref 135–145)

## 2021-07-07 LAB — GLUCOSE, CAPILLARY
Glucose-Capillary: 235 mg/dL — ABNORMAL HIGH (ref 70–99)
Glucose-Capillary: 193 mg/dL — ABNORMAL HIGH (ref 70–99)
Glucose-Capillary: 199 mg/dL — ABNORMAL HIGH (ref 70–99)
Glucose-Capillary: 186 mg/dL — ABNORMAL HIGH (ref 70–99)
Glucose-Capillary: 300 mg/dL — ABNORMAL HIGH (ref 70–99)

## 2021-07-07 LAB — HEPATIC FUNCTION PANEL
ALT: 16 U/L (ref 0–44)
AST: 27 U/L (ref 15–41)
Albumin: 1.9 g/dL — ABNORMAL LOW (ref 3.5–5.0)
Alkaline Phosphatase: 64 U/L (ref 38–126)
Bilirubin, Direct: 0.4 mg/dL — ABNORMAL HIGH (ref 0.0–0.2)
Indirect Bilirubin: 1 mg/dL — ABNORMAL HIGH (ref 0.3–0.9)
Total Bilirubin: 1.4 mg/dL — ABNORMAL HIGH (ref 0.3–1.2)
Total Protein: 6.8 g/dL (ref 6.5–8.1)

## 2021-07-07 SURGERY — ECHOCARDIOGRAM, TRANSESOPHAGEAL
Anesthesia: Moderate Sedation

## 2021-07-07 MED ORDER — SODIUM CHLORIDE 0.9 % IV SOLN
INTRAVENOUS | Status: DC
Start: 2021-07-07 — End: 2021-07-08

## 2021-07-07 MED ORDER — FENTANYL CITRATE (PF) 100 MCG/2ML IJ SOLN
INTRAMUSCULAR | Status: AC | PRN
  Administered 2021-07-07: 25 ug via INTRAVENOUS

## 2021-07-07 MED ORDER — FENTANYL CITRATE (PF) 100 MCG/2ML IJ SOLN
INTRAMUSCULAR | Status: AC
  Filled 2021-07-07: qty 2

## 2021-07-07 MED ORDER — SODIUM CHLORIDE FLUSH 0.9 % IV SOLN
INTRAVENOUS | Status: AC
  Filled 2021-07-07: qty 10

## 2021-07-07 MED ORDER — BUTAMBEN-TETRACAINE-BENZOCAINE 2-2-14 % EX AERO
INHALATION_SPRAY | CUTANEOUS | Status: AC
  Filled 2021-07-07: qty 5

## 2021-07-07 MED ORDER — MIDAZOLAM HCL 2 MG/2ML IJ SOLN
INTRAMUSCULAR | Status: AC | PRN
  Administered 2021-07-07: 1 mg via INTRAVENOUS

## 2021-07-07 MED ORDER — MIDAZOLAM HCL 2 MG/2ML IJ SOLN
INTRAMUSCULAR | Status: AC
  Filled 2021-07-07: qty 4

## 2021-07-07 MED ORDER — LIDOCAINE VISCOUS HCL 2 % MT SOLN
OROMUCOSAL | Status: AC
  Filled 2021-07-07: qty 15

## 2021-07-07 NOTE — Progress Notes (Signed)
PROGRESS NOTE    Joshua Cole  ZCH:[id number] DOB: 14-Oct-1960 DOA: 07/03/2021 PCP: Pcp, No    Brief Narrative:  60 y.o. male with medical history significant of gout, alcohol use, who presents with generalized weakness, multiple joints pain, confusion, nausea, vomiting, diarrhea, fever.   Per his fiance (I called her fianc by phone), patient received flu shot and COVID-vaccine booster on Friday.  In Saturday night, patient had fever of 102 and chills.  He complains of multiple joint pain, including right hand, left shoulder, bilateral hip and lower back.  He has nausea, vomiting and few times of watery diarrhea, which has resolved per patient.  Patient has mild dry cough, mild shortness of breath, denies chest pain. Patient was found to be intermittently confused.  When saw patient in the ED, patient is mildly confused, but is still oriented x3.  He moves all extremities.  No facial droop or slurred speech.  Denies symptoms of UTI.  Her fianc states that patient was seen in urgent care and was tested negative for flu and COVID, but started him on Tamiflu and steroid on 10/20.  Blood cultures positive for MSSA.  Pharmacy contacted attending provider who added IV cefazolin.  Infectious disease on consult.  Patient remains hemodynamically stable.  Mental status improving.  Desaturation event on 10/24.  Follow-up CT angiography negative for PE.  Is concerning for multiple ill-defined lesions, suspect septic emboli.  Patient also endorsing pain in bilateral hips.    Patient was scheduled for TEE on 10/25 however unfortunately he ate breakfast.  TEE canceled for today and will be rescheduled for 10/26.  10/26 patient is scheduled for TEE today.EKG this am with av block   Assessment & Plan:   Principal Problem:   Hyponatremia Active Problems:   Acute metabolic encephalopathy   Joint pain   Generalized weakness   SIRS (systemic inflammatory response syndrome) (HCC)   Hypokalemia    Hyperglycemia   Thrombocytopenia (HCC)   Nausea vomiting and diarrhea   Gout   Bacteremia  MSSA bacteremia Sepsis secondary to above Sepsis criteria met with leukocytosis and tachycardia Source of bacteremia is unclear Patient was started on Ancef Sepsis physiology improving TTE with possible tricuspid valve vegetation Cardiology consulted with plans for TEE Patient was complaining of pain in bilateral hips.  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 Will consult EP for additional input. Avoid avn blocking agents.   Acute hypoxic respiratory failure Patient had desaturation event on 10/24 Oxygen saturation on room air mid 80s Recovered to mid 90s with 4 L nasal cannula CT angiography negative for PE Concerning for multiple ill-defined lesions, possible septic emboli Fluid overload on differential 10/26 ivf d/c'd    Acute metabolic encephalopathy Etiology not entirely clear TTP ruled out after oncology reviewed peripheral smear MRI reassuring Low suspicion for intracranial thrombosis related to COVID-vaccine Mental status seems to be improving Suspect related to bacteremia Plan: Frequent neurochecks  New diagnosis diabetes mellitus with hyperglycemia Hemoglobin A1c 7.8 Patient does not see physicians and is not on any diabetic medication Plan: Basal bolus regimen with Semglee and NovoLog Carb modified diet Diabetes coordinator consult,  recommendations appreciated  Diffuse joint pain Generalized  weakness Possible exacerbation of gout, felt unlikely Would be atypical presentation Has been on steroids since 10/20 Uric acid normal Plan: Steroids stopped Discontinue Toradol in the setting of thrombocytopenia As needed narcotics for breakthrough pain Therapy evaluations as able Orthopedic consult, consider arthrocentesis  Thrombocytopenia Platelets and 60s, no active bleeding Suspect secondary to alcohol intake TTP unlikely due to lack of schistocytes on peripheral smear Appreciate oncology follow-up  Hyponatremia Likely secondary to alcohol abuse and poor oral intake 10/26 Na level continues to drop. 124 today Will consult nephrology   Hypokalemia Monitor and replace potassium as necessary  Hyperglycemia Likely related to recent steroid use  Intractable nausea vomiting Diarrhea Resolved    DVT prophylaxis: SCD Code Status: Full Family Communication: none at bedside Disposition Plan: Status is: Inpatient  Remains inpatient appropriate because: Sepsis, MSSA bacteremia, acute metabolic encephalopathy.  Disposition plan pending.  Not medically ready for discharge at this time.  Will need TEE.  Possible surgical intervention depending on TEE results       Level of care: Med-Surg  Consultants:  ID Heme-onc Cardiology  Procedures:  None  Antimicrobials: Cefazolin   Subjective:  Pt reports being npo. No sob or cp, no dizziness Objective: Vitals:   07/06/21 1530 07/06/21 1915 07/07/21 0346 07/07/21 0800  BP: (!) 139/57 (!) 141/54 (!) 145/62 (!) 129/59  Pulse: 70 78 (!) 56 64  Resp:  16 (!) 24   Temp: 99.2 F (37.3 C) 98.3 F (36.8 C) 98.1 F (36.7 C) 98.5 F (36.9 C)  TempSrc: Oral Oral Oral Oral  SpO2: 90% 92% 96% 96%  Weight:      Height:        Intake/Output Summary (Last 24 hours) at 07/07/2021 0823 Last data filed at 07/07/2021 0546 Gross per 24 hour  Intake 947.83 ml  Output 1200 ml  Net -252.17 ml   Filed Weights   07/03/21  0954 07/03/21 1145  Weight: 105.2 kg 113.1 kg    Examination: Nad, calm Cta no w/r/r Regular s1/s2 no gallop Soft benign +bs No edema Mood and affect appropriate in current setting    Data Reviewed: I have personally reviewed following labs and imaging studies  CBC: Recent Labs  Lab 07/03/21 1655 07/04/21 0506 07/05/21 0747 07/06/21 0814 07/07/21 0445  WBC 13.1* 16.2* 15.9* 13.8* 14.4*  NEUTROABS 11.5*  --  13.3* 11.5* 11.9*  HGB 15.1 14.3 12.6* 12.6* 11.9*  HCT 41.9 38.7* 34.6* 35.2* 32.4*  MCV 91.1 94.2 94.0 93.4 92.3  PLT 67* 59* 71* 57* 78*   Basic Metabolic Panel: Recent Labs  Lab 07/03/21 1655 07/03/21 2301 07/04/21 0506 07/05/21 0747 07/06/21 0814 07/07/21 0445  NA 124* 122* 128* 127* 125* 124*  K 3.4* 4.1 4.1 3.7 3.4* 3.8  CL 85* 87* 94* 92* 91* 90*  CO2 _0 GLUCOSE 267* 312* 281* 282* 230* 235*  BUN 31* 32* 29* 30* 36* 27*  CREATININE 1.19 0.99 0.91 0.87 1.04 0.91  CALCIUM 8.1* 7.8* 7.8* 7.8* 7.5* 7.6*  MG 2.2  --   --   --   --   --   PHOS 3.4  --   --   --   --   --    GFR: Estimated Creatinine Clearance: 111.4 mL/min (by C-G formula based on SCr of 0.91 mg/dL). Liver Function Tests: No results for input(s): AST, ALT, ALKPHOS, BILITOT, PROT, ALBUMIN in the last 168 hours. No results  for input(s): LIPASE, AMYLASE in the last 168 hours. No results for input(s): AMMONIA in the last 168 hours. Coagulation Profile: Recent Labs  Lab 07/03/21 1655 07/04/21 0506  INR 1.1 1.2   Cardiac Enzymes: No results for input(s): CKTOTAL, CKMB, CKMBINDEX, TROPONINI in the last 168 hours. BNP (last 3 results) No results for input(s): PROBNP in the last 8760 hours. HbA1C: Recent Labs    07/04/21 1809  HGBA1C 8.0*   CBG: Recent Labs  Lab 07/06/21 0754 07/06/21 1103 07/06/21 1627 07/06/21 2110 07/07/21 0759  GLUCAP 239* 173* 202* 298* 235*   Lipid Profile: No results for input(s): CHOL, HDL, LDLCALC, TRIG, CHOLHDL, LDLDIRECT in  the last 72 hours. Thyroid Function Tests: No results for input(s): TSH, T4TOTAL, FREET4, T3FREE, THYROIDAB in the last 72 hours. Anemia Panel: No results for input(s): VITAMINB12, FOLATE, FERRITIN, TIBC, IRON, RETICCTPCT in the last 72 hours. Sepsis Labs: No results for input(s): PROCALCITON, LATICACIDVEN in the last 168 hours.  Recent Results (from the past 240 hour(s))  Resp Panel by RT-PCR (Flu A&B, Covid) Nasopharyngeal Swab     Status: None   Collection Time: 07/03/21 12:08 PM   Specimen: Nasopharyngeal Swab; Nasopharyngeal(NP) swabs in vial transport medium  Result Value Ref Range Status   SARS Coronavirus 2 by RT PCR NEGATIVE NEGATIVE Final    Comment: (NOTE) SARS-CoV-2 target nucleic acids are NOT DETECTED.  The SARS-CoV-2 RNA is generally detectable in upper respiratory specimens during the acute phase of infection. The lowest concentration of SARS-CoV-2 viral copies this assay can detect is 138 copies/mL. A negative result does not preclude SARS-Cov-2 infection and should not be used as the sole basis for treatment or other patient management decisions. A negative result may occur with  improper specimen collection/handling, submission of specimen other than nasopharyngeal swab, presence of viral mutation(s) within the areas targeted by this assay, and inadequate number of viral copies(<138 copies/mL). A negative result must be combined with clinical observations, patient history, and epidemiological information. The expected result is Negative.  Fact Sheet for Patients:  EntrepreneurPulse.com.au  Fact Sheet for Healthcare Providers:  IncredibleEmployment.be  This test is no t yet approved or cleared by the [place] FDA and  has been authorized for detection and/or diagnosis of SARS-CoV-2 by FDA under an Emergency Use Authorization (EUA). This EUA will remain  in effect (meaning this test can be used) for the duration of  the COVID-19 declaration under Section 564(b)(1) of the Act, 21 U.S.C.section 360bbb-3(b)(1), unless the authorization is terminated  or revoked sooner.       Influenza A by PCR NEGATIVE NEGATIVE Final   Influenza B by PCR NEGATIVE NEGATIVE Final    Comment: (NOTE) The Xpert Xpress SARS-CoV-2/FLU/RSV plus assay is intended as an aid in the diagnosis of influenza from Nasopharyngeal swab specimens and should not be used as a sole basis for treatment. Nasal washings and aspirates are unacceptable for Xpert Xpress SARS-CoV-2/FLU/RSV testing.  Fact Sheet for Patients: EntrepreneurPulse.com.au  Fact Sheet for Healthcare Providers: IncredibleEmployment.be  This test is not yet approved or cleared by the [place] FDA and has been authorized for detection and/or diagnosis of SARS-CoV-2 by FDA under an Emergency Use Authorization (EUA). This EUA will remain in effect (meaning this test can be used) for the duration of the COVID-19 declaration under Section 564(b)(1) of the Act, 21 U.S.C. section 360bbb-3(b)(1), unless the authorization is terminated or revoked.  Performed at [facility], [address] [place], [place] [address]   Urine  Culture     Status: Abnormal   Collection Time: 07/03/21  3:09 PM   Specimen: Urine, Random  Result Value Ref Range Status   Specimen Description   Final    URINE, RANDOM Performed at [facility], [address], [place], [place] [address]    Special Requests   Final    NONE Performed at [facility], [address] [place], [place] [address]    Culture MULTIPLE SPECIES PRESENT, SUGGEST RECOLLECTION (A)  Final   Report Status 07/05/2021 FINAL  Final  Culture, blood (x 2)     Status: Abnormal   Collection Time: 07/03/21  4:55 PM   Specimen: BLOOD  Result Value Ref Range Status   Specimen Description   Final    BLOOD LEFT ANTECUBITAL Performed at [facility],  [address], [place], [place] [address]    Special Requests   Final    BOTTLES DRAWN AEROBIC AND ANAEROBIC Blood Culture results may not be optimal due to an excessive volume of blood received in culture bottles Performed at [facility], [address] [place], [place] [address]    Culture  Setup Time   Final    IN BOTH AEROBIC AND ANAEROBIC BOTTLES GRAM POSITIVE COCCI CRITICAL RESULT CALLED TO, READ BACK BY AND VERIFIED WITH: NATHAN BELUE AT 0258 07/04/21.PMF Performed at [facility], [place] [address], [place], [place] [address]    Culture STAPHYLOCOCCUS AUREUS (A)  Final   Report Status 07/06/2021 FINAL  Final   Organism ID, Bacteria STAPHYLOCOCCUS AUREUS  Final      Susceptibility   Staphylococcus aureus - MIC*    CIPROFLOXACIN <=0.5 SENSITIVE Sensitive     ERYTHROMYCIN <=0.25 SENSITIVE Sensitive     GENTAMICIN <=0.5 SENSITIVE Sensitive     OXACILLIN <=0.25 SENSITIVE Sensitive     TETRACYCLINE <=1 SENSITIVE Sensitive     VANCOMYCIN <=0.5 SENSITIVE Sensitive     TRIMETH/SULFA <=10 SENSITIVE Sensitive     CLINDAMYCIN <=0.25 SENSITIVE Sensitive     RIFAMPIN <=0.5 SENSITIVE Sensitive     Inducible Clindamycin NEGATIVE Sensitive     * STAPHYLOCOCCUS AUREUS  Blood Culture ID Panel (Reflexed)     Status: Abnormal   Collection Time: 07/03/21  4:55 PM  Result Value Ref Range Status   Enterococcus faecalis NOT DETECTED NOT DETECTED Final   Enterococcus Faecium NOT DETECTED NOT DETECTED Final   Listeria monocytogenes NOT DETECTED NOT DETECTED Final   Staphylococcus species DETECTED (A) NOT DETECTED Final    Comment: CRITICAL RESULT CALLED TO, READ BACK BY AND VERIFIED WITH: NATHAN BELUE AT 2423 07/04/21.PMF    Staphylococcus aureus (BCID) DETECTED (A) NOT DETECTED Final    Comment: CRITICAL RESULT CALLED TO, READ BACK BY AND VERIFIED WITH: NATHAN BELUE AT 5361 07/04/21.PMF    Staphylococcus epidermidis NOT DETECTED NOT DETECTED Final   Staphylococcus  lugdunensis NOT DETECTED NOT DETECTED Final   Streptococcus species NOT DETECTED NOT DETECTED Final   Streptococcus agalactiae NOT DETECTED NOT DETECTED Final   Streptococcus pneumoniae NOT DETECTED NOT DETECTED Final   Streptococcus pyogenes NOT DETECTED NOT DETECTED Final   A.calcoaceticus-baumannii NOT DETECTED NOT DETECTED Final   Bacteroides fragilis NOT DETECTED NOT DETECTED Final   Enterobacterales NOT DETECTED NOT DETECTED Final   Enterobacter cloacae complex NOT DETECTED NOT DETECTED Final   Escherichia coli NOT DETECTED NOT DETECTED Final   Klebsiella aerogenes NOT DETECTED NOT DETECTED Final   Klebsiella oxytoca NOT DETECTED NOT DETECTED Final  Klebsiella pneumoniae NOT DETECTED NOT DETECTED Final   Proteus species NOT DETECTED NOT DETECTED Final   Salmonella species NOT DETECTED NOT DETECTED Final   Serratia marcescens NOT DETECTED NOT DETECTED Final   Haemophilus influenzae NOT DETECTED NOT DETECTED Final   Neisseria meningitidis NOT DETECTED NOT DETECTED Final   Pseudomonas aeruginosa NOT DETECTED NOT DETECTED Final   Stenotrophomonas maltophilia NOT DETECTED NOT DETECTED Final   Candida albicans NOT DETECTED NOT DETECTED Final   Candida auris NOT DETECTED NOT DETECTED Final   Candida glabrata NOT DETECTED NOT DETECTED Final   Candida krusei NOT DETECTED NOT DETECTED Final   Candida parapsilosis NOT DETECTED NOT DETECTED Final   Candida tropicalis NOT DETECTED NOT DETECTED Final   Cryptococcus neoformans/gattii NOT DETECTED NOT DETECTED Final   Meth resistant mecA/C and MREJ NOT DETECTED NOT DETECTED Final    Comment: Performed at [facility], [address] [place], [place] [address]  Culture, blood (x 2)     Status: Abnormal   Collection Time: 07/03/21  6:37 PM   Specimen: BLOOD  Result Value Ref Range Status   Specimen Description   Final    BLOOD RIGHT HAND Performed at [facility], [address] [place], [place] [address]    Special  Requests   Final    BOTTLES DRAWN AEROBIC AND ANAEROBIC Blood Culture adequate volume Performed at [facility], [address] [place], [place] [address]    Culture  Setup Time   Final    GRAM POSITIVE COCCI IN BOTH AEROBIC AND ANAEROBIC BOTTLES CRITICAL RESULT CALLED TO, READ BACK BY AND VERIFIED WITH: NATHAN BELUE AT 7824 07/04/21.PMF Performed at [facility], [address] [place], [place] [address]    Culture (A)  Final    STAPHYLOCOCCUS AUREUS SUSCEPTIBILITIES PERFORMED ON PREVIOUS CULTURE WITHIN THE LAST 5 DAYS. Performed at [facility], [place] [address], [place], [place] [address]    Report Status 07/06/2021 FINAL  Final  CULTURE, BLOOD (ROUTINE X 2) w Reflex to ID Panel     Status: None (Preliminary result)   Collection Time: 07/05/21  7:47 AM   Specimen: BLOOD  Result Value Ref Range Status   Specimen Description BLOOD RIGHT WRIST  Final   Special Requests   Final    BOTTLES DRAWN AEROBIC AND ANAEROBIC Blood Culture adequate volume   Culture   Final    NO GROWTH 2 DAYS Performed at [facility], [address], [place], [place] [address]    Report Status PENDING  Incomplete  CULTURE, BLOOD (ROUTINE X 2) w Reflex to ID Panel     Status: None (Preliminary result)   Collection Time: 07/05/21  7:47 AM   Specimen: BLOOD  Result Value Ref Range Status   Specimen Description BLOOD BLOOD RIGHT HAND  Final   Special Requests   Final    BOTTLES DRAWN AEROBIC ONLY Blood Culture adequate volume   Culture   Final    NO GROWTH 2 DAYS Performed at [facility], [address] [place], [place] [address]    Report Status PENDING  Incomplete         Radiology Studies: CT Angio Chest Pulmonary Embolism (PE) W or WO Contrast  Result Date: 07/05/2021 CLINICAL DATA:  Positive D-dimer level. EXAM: CT ANGIOGRAPHY CHEST WITH CONTRAST TECHNIQUE: Multidetector CT imaging of the chest was performed using the standard protocol during  bolus administration of intravenous contrast. Multiplanar CT image reconstructions and MIPs were  obtained to evaluate the vascular anatomy. CONTRAST:  28m OMNIPAQUE IOHEXOL 350 MG/ML SOLN COMPARISON:  None. FINDINGS: Cardiovascular: Satisfactory opacification of the pulmonary arteries to the segmental level. No evidence of pulmonary embolism. Normal heart size. No pericardial effusion. Atherosclerosis of thoracic aorta is noted without aneurysm formation. Mediastinum/Nodes: No enlarged mediastinal, hilar, or axillary lymph nodes. Thyroid gland, trachea, and esophagus demonstrate no significant findings. Lungs/Pleura: No pneumothorax is noted. Mild bibasilar subsegmental atelectasis is noted. Multiple ill-defined nodular opacities are noted throughout both lungs with the largest measuring 13 mm in the right upper lobe. This is concerning for metastatic disease or possibly multifocal infection. Upper Abdomen: No acute abnormality. Musculoskeletal: No chest wall abnormality. No acute or significant osseous findings. Review of the MIP images confirms the above findings. IMPRESSION: No definite evidence of pulmonary embolus is noted. Multiple ill-defined nodular opacities are noted throughout both lungs, the largest measuring 13 mm in the right upper lobe. This is concerning for diffuse metastatic disease or possibly multifocal pneumonia. Clinical correlation and short-term follow-up CT scan may be performed. Mild bibasilar subsegmental atelectasis is noted. Aortic Atherosclerosis (ICD10-I70.0). Electronically Signed   By: JMarijo ConceptionM.D.   On: 07/05/2021 11:44   MR PELVIS WO CONTRAST  Result Date: 07/05/2021 CLINICAL DATA:  Hip replacement, infection suspected EXAM: MRI PELVIS WITHOUT CONTRAST TECHNIQUE: Multiplanar multisequence MR imaging of the pelvis was performed. No intravenous contrast was administered. COMPARISON:  None. FINDINGS: Urinary Tract:  No abnormality visualized. Bowel:  Unremarkable  visualized pelvic bowel loops. Vascular/Lymphatic: No pathologically enlarged lymph nodes. No significant vascular abnormality seen. Reproductive:  No mass or other significant abnormality Other:  None. Musculoskeletal: There are bilateral hip arthroplasties with associated susceptibility artifact. There are moderate right and trace left joint effusions. There is no visible marrow signal abnormality. Lower lumbar spine degenerative disc disease, partially visualized. There is intramuscular edema bilaterally involving the gluteus musculature and abductor muscles. 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 Moderate right and trace left joint effusions, with right-sided periprostatic fluid collection tracking posteriorly along the greater trochanter. No marrow signal changes to suggest osteomyelitis. If there is concern for infected hip joint, aspiration should be considered. Symmetric bilateral intramuscular edema involving the gluteal musculature and adductor muscles, consistent with non-specific myositis. No evidence of pyomyositis. Electronically Signed   By: JMaurine SimmeringM.D.   On: 07/05/2021 16:00   DG Chest Port 1 View  Result Date: 07/05/2021 CLINICAL DATA:  Weakness and shortness of breath EXAM: PORTABLE CHEST 1 VIEW COMPARISON:  Radiograph 07/03/2021 FINDINGS: Unchanged cardiomediastinal silhouette. Nodular opacities in the lungs are better seen on recent chest CT. Bibasilar atelectasis. No large effusion. No pneumothorax. No acute osseous abnormality. IMPRESSION: Nodular opacities in the lungs are better seen on recent chest CT. Bibasilar atelectasis. Electronically Signed   By: JMaurine SimmeringM.D.   On: 07/05/2021 13:45        Scheduled Meds:  insulin aspart  0-20 Units Subcutaneous TID WC   insulin aspart  0-5 Units Subcutaneous QHS   insulin aspart  6 Units Subcutaneous TID WC   insulin glargine-yfgn  23 Units Subcutaneous QHS   multivitamin with minerals   1 tablet Oral Daily   pantoprazole  40 mg Oral Daily   Continuous Infusions:  sodium chloride 20 mL/hr at 07/06/21 2331   sodium chloride 20 mL/hr at 07/07/21 0546    ceFAZolin (ANCEF) IV 2 g (07/07/21 0627)     LOS: 4 days    Time spent: 35  min with >50% on coc    Nolberto Hanlon, MD [facility]   If 7PM-7AM, please contact night-coverage

## 2021-07-07 NOTE — Progress Notes (Signed)
Patient ID: Joshua Cole, male   DOB: February 25, 1961, 60 y.o.   MRN: [id number]  Telemetry called to report an unknown abnormal rhythm. 2 ECGs done. Results sent to [place] denies any abnormal symptoms and is resting in bed.  Haydee Salter, RN

## 2021-07-07 NOTE — Progress Notes (Signed)
*  PRELIMINARY RESULTS* Echocardiogram Echocardiogram Transesophageal has been performed.  Sherrie Sport 07/07/2021, 2:24 PM

## 2021-07-07 NOTE — Consult Note (Signed)
ORTHOPAEDIC CONSULTATION  REQUESTING PHYSICIAN: Nolberto Hanlon, MD  Chief Complaint: concern for potential periprosthetic hip infection  HPI: Joshua Cole is a 60 y.o. male who is admitted with MSSA bacteremia. 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 He states that he has not had many issues with the hips over the years and has not had any increased pain over the last few days. He states he has been able to ambulate to the bathroom without problems. He does not have pain in the hips at rest or with activity. He does note some general muscle aches.  PMH notable for gout, alcohol use, generalized weakness, multiple joints pain, confusion, nausea, vomiting, diarrhea, fever.    Past Medical History:  Diagnosis Date   Arthritis    DJD (degenerative joint disease)    Gout    Past Surgical History:  Procedure Laterality Date   JOINT REPLACEMENT     Social History   Socioeconomic History   Marital status: Unknown    Spouse name: Not on file   Number of children: Not on file   Years of education: Not on file   Highest education level: Not on file  Occupational History   Not on file  Tobacco Use   Smoking status: Never   Smokeless tobacco: Never  Substance and Sexual Activity   Alcohol use: Yes    Comment: bilateral hip replacement   Drug use: Never   Sexual activity: Not on file  Other Topics Concern   Not on file  Social History Narrative   Not on file   Social Determinants of Health   Financial Resource Strain: Not on file  Food Insecurity: Not on file  Transportation Needs: Not on file  Physical Activity: Not on file  Stress: Not on file  Social Connections: Not on file   Family History  Problem Relation Age of Onset   Psoriasis Sister    No Known Allergies Prior to Admission medications   Not on File   CT Angio Chest Pulmonary Embolism (PE) W or WO Contrast  Result Date: 07/05/2021 CLINICAL DATA:  Positive D-dimer level. EXAM: CT  ANGIOGRAPHY CHEST WITH CONTRAST TECHNIQUE: Multidetector CT imaging of the chest was performed using the standard protocol during bolus administration of intravenous contrast. Multiplanar CT image reconstructions and MIPs were obtained to evaluate the vascular anatomy. CONTRAST:  64mL OMNIPAQUE IOHEXOL 350 MG/ML SOLN COMPARISON:  None. FINDINGS: Cardiovascular: Satisfactory opacification of the pulmonary arteries to the segmental level. No evidence of pulmonary embolism. Normal heart size. No pericardial effusion. Atherosclerosis of thoracic aorta is noted without aneurysm formation. Mediastinum/Nodes: No enlarged mediastinal, hilar, or axillary lymph nodes. Thyroid gland, trachea, and esophagus demonstrate no significant findings. Lungs/Pleura: No pneumothorax is noted. Mild bibasilar subsegmental atelectasis is noted. Multiple ill-defined nodular opacities are noted throughout both lungs with the largest measuring 13 mm in the right upper lobe. This is concerning for metastatic disease or possibly multifocal infection. Upper Abdomen: No acute abnormality. Musculoskeletal: No chest wall abnormality. No acute or significant osseous findings. Review of the MIP images confirms the above findings. IMPRESSION: No definite evidence of pulmonary embolus is noted. Multiple ill-defined nodular opacities are noted throughout both lungs, the largest measuring 13 mm in the right upper lobe. This is concerning for diffuse metastatic disease or possibly multifocal pneumonia. Clinical correlation and short-term follow-up CT scan may be performed. Mild bibasilar subsegmental atelectasis is noted. Aortic Atherosclerosis (ICD10-I70.0). Electronically Signed   By: Bobbe Medico.D.  On: 07/05/2021 11:44   MR PELVIS WO CONTRAST  Result Date: 07/05/2021 CLINICAL DATA:  Hip replacement, infection suspected EXAM: MRI PELVIS WITHOUT CONTRAST TECHNIQUE: Multiplanar multisequence MR imaging of the pelvis was performed. No  intravenous contrast was administered. COMPARISON:  None. FINDINGS: Urinary Tract:  No abnormality visualized. Bowel:  Unremarkable visualized pelvic bowel loops. Vascular/Lymphatic: No pathologically enlarged lymph nodes. No significant vascular abnormality seen. Reproductive:  No mass or other significant abnormality Other:  None. Musculoskeletal: There are bilateral hip arthroplasties with associated susceptibility artifact. There are moderate right and trace left joint effusions. There is no visible marrow signal abnormality. Lower lumbar spine degenerative disc disease, partially visualized. There is intramuscular edema bilaterally involving the gluteus musculature and abductor muscles. 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 Moderate right and trace left joint effusions, with right-sided periprostatic fluid collection tracking posteriorly along the greater trochanter. No marrow signal changes to suggest osteomyelitis. If there is concern for infected hip joint, aspiration should be considered. Symmetric bilateral intramuscular edema involving the gluteal musculature and adductor muscles, consistent with non-specific myositis. No evidence of pyomyositis. Electronically Signed   By: Maurine Simmering M.D.   On: 07/05/2021 16:00   DG Chest Port 1 View  Result Date: 07/05/2021 CLINICAL DATA:  Weakness and shortness of breath EXAM: PORTABLE CHEST 1 VIEW COMPARISON:  Radiograph 07/03/2021 FINDINGS: Unchanged cardiomediastinal silhouette. Nodular opacities in the lungs are better seen on recent chest CT. Bibasilar atelectasis. No large effusion. No pneumothorax. No acute osseous abnormality. IMPRESSION: Nodular opacities in the lungs are better seen on recent chest CT. Bibasilar atelectasis. Electronically Signed   By: Maurine Simmering M.D.   On: 07/05/2021 13:45    Positive ROS: All other systems have been reviewed and were otherwise negative with the exception of those  mentioned in the HPI and as above.  Physical Exam: General: Alert, no acute distress Cardiovascular: No pedal edema Respiratory: No cyanosis, no use of accessory musculature GI: No organomegaly, abdomen is soft and non-tender Skin: No lesions in the area of chief complaint Neurologic: Sensation intact distally Psychiatric: Patient is competent for consent with normal mood and affect Lymphatic: No axillary or cervical lymphadenopathy  MUSCULOSKELETAL:   Bilateral hips: no tenderness to palpation, incisions are well healed, no erythema, able to perform straight leg raise, tolerates passive ROM 0-110 with 10 IR/40ER without pain. NVI   Assessment: 60yo M with MSSA bacteremia on cefazolin, history of prior bilateral THA. There is minimal concern for periprosthetic hip infection given patient's exam findings with no pain with active or passive hip ROM and his ability to ambulate without significant hip pain. No need for hip joint aspiration. Recommend continuing to monitor.    Renee Harder, MD    07/07/2021 10:25 AM

## 2021-07-07 NOTE — Consult Note (Signed)
Cardiology Consultation:   Patient ID: Joshua Cole MRN: [id number]; DOB: June 04, 1961  Admit date: 07/03/2021 Date of Consult: 07/07/2021  PCP:  Merryl Hacker, No   [facility] Providers Cardiologist:  None   {   Patient Profile:   Joshua Cole is a 60 y.o. male with a hx of gout, alcohol use, joint pain who is being seen 07/07/2021 for the evaluation of complete heart block at the request of Dr. Garen Lah.  History of Present Illness:   [name] presented to the hospital on October 22 with weakness.  He had fever chills, nausea, vomiting for several days.  Admission labs showed leukocytosis.  Admission blood cultures were positive for MSSA bacteremia.  Transthoracic echocardiogram demonstrated possible vegetation on the tricuspid valve and cardiology was consulted.  Subsequent transesophageal echocardiogram has revealed a tricuspid valve vegetation.  The patient is also developed complete heart block for which I am consulted today.   Past Medical History:  Diagnosis Date   Arthritis    DJD (degenerative joint disease)    Gout     Past Surgical History:  Procedure Laterality Date   JOINT REPLACEMENT         Inpatient Medications: Scheduled Meds:  butamben-tetracaine-benzocaine       fentaNYL       insulin aspart  0-20 Units Subcutaneous TID WC   insulin aspart  0-5 Units Subcutaneous QHS   insulin aspart  6 Units Subcutaneous TID WC   insulin glargine-yfgn  23 Units Subcutaneous QHS   midazolam       multivitamin with minerals  1 tablet Oral Daily   pantoprazole  40 mg Oral Daily   sodium chloride flush       Continuous Infusions:  sodium chloride 20 mL/hr at 07/06/21 2331   sodium chloride 20 mL/hr at 07/07/21 0546    ceFAZolin (ANCEF) IV 2 g (07/07/21 1735)   PRN Meds: acetaminophen, gadobutrol, morphine injection, ondansetron (ZOFRAN) IV, oxyCODONE  Allergies:   No Known Allergies  Social History:   Social History   Socioeconomic History    Marital status: Unknown    Spouse name: Not on file   Number of children: Not on file   Years of education: Not on file   Highest education level: Not on file  Occupational History   Not on file  Tobacco Use   Smoking status: Never   Smokeless tobacco: Never  Substance and Sexual Activity   Alcohol use: Yes    Comment: bilateral hip replacement   Drug use: Never   Sexual activity: Not on file  Other Topics Concern   Not on file  Social History Narrative   Not on file   Social Determinants of Health   Financial Resource Strain: Not on file  Food Insecurity: Not on file  Transportation Needs: Not on file  Physical Activity: Not on file  Stress: Not on file  Social Connections: Not on file  Intimate Partner Violence: Not on file    Family History:    Family History  Problem Relation Age of Onset   Psoriasis Sister      ROS:  Please see the history of present illness.   All other ROS reviewed and negative.     Physical Exam/Data:   Vitals:   07/07/21 1445 07/07/21 1516 07/07/21 1547 07/07/21 1943  BP: 132/67  (!) 136/44 (!) 140/58  Pulse: 78  (!) 57 (!) 57  Resp: (!) 30 16 19 20   Temp:    98.9 F (37.2  C)  TempSrc:    Oral  SpO2: 94%  97% 95%  Weight:      Height:        Intake/Output Summary (Last 24 hours) at 07/07/2021 2256 Last data filed at 07/07/2021 1715 Gross per 24 hour  Intake 609.01 ml  Output 300 ml  Net 309.01 ml   Last 3 Weights 07/03/2021 07/03/2021  Weight (lbs) 249 lb 4.8 oz 232 lb  Weight (kg) 113.082 kg 105.235 kg     Body mass index is 35.77 kg/m.  General: Ill-appearing, no acute distress HEENT: normal Neck: no JVD Vascular: No carotid bruits; Distal pulses 2+ bilaterally Cardiac:  normal S1, S2; RRR; no murmur  Lungs:  clear to auscultation bilaterally, no wheezing, rhonchi or rales  Abd: soft, nontender, no hepatomegaly  Ext: no edema Musculoskeletal:  No deformities, BUE and BLE strength normal and equal Skin: warm and  dry  Neuro:  CNs 2-12 intact, no focal abnormalities noted Psych:  Normal affect   EKG:  The EKG was personally reviewed and demonstrates: Complete heart block Telemetry:  Telemetry was personally reviewed and demonstrates: Sinus tachycardia, complete heart block with a narrow escape in the 70s  Relevant CV Studies: Transthoracic echo and transesophageal echo have been reviewed and demonstrate tricuspid valve vegetation  Laboratory Data:  High Sensitivity Troponin:  No results for input(s): TROPONINIHS in the last 720 hours.   Chemistry Recent Labs  Lab 07/03/21 1655 07/03/21 2301 07/05/21 0747 07/06/21 0814 07/07/21 0445  NA 124*   < > 127* 125* 124*  K 3.4*   < > 3.7 3.4* 3.8  CL 85*   < > 92* 91* 90*  CO2 27   < > 27 25 26   GLUCOSE 267*   < > 282* 230* 235*  BUN 31*   < > 30* 36* 27*  CREATININE 1.19   < > 0.87 1.04 0.91  CALCIUM 8.1*   < > 7.8* 7.5* 7.6*  MG 2.2  --   --   --   --   GFRNONAA >60   < > >60 >60 >60  ANIONGAP 12   < > 8 9 8    < > = values in this interval not displayed.    Recent Labs  Lab 07/07/21 1213  PROT 6.8  ALBUMIN 1.9*  AST 27  ALT 16  ALKPHOS 64  BILITOT 1.4*   Lipids No results for input(s): CHOL, TRIG, HDL, LABVLDL, LDLCALC, CHOLHDL in the last 168 hours.  Hematology Recent Labs  Lab 07/05/21 0747 07/06/21 0814 07/07/21 0445  WBC 15.9* 13.8* 14.4*  RBC 3.68* 3.77* 3.51*  HGB 12.6* 12.6* 11.9*  HCT 34.6* 35.2* 32.4*  MCV 94.0 93.4 92.3  MCH 34.2* 33.4 33.9  MCHC 36.4* 35.8 36.7*  RDW 12.2 12.8 12.5  PLT 71* 57* 78*   Thyroid No results for input(s): TSH, FREET4 in the last 168 hours.  BNP Recent Labs  Lab 07/05/21 0747  BNP 219.6*    DDimer  Recent Labs  Lab 07/04/21 0506  DDIMER 3.78*     Radiology/Studies:  CT Angio Chest Pulmonary Embolism (PE) W or WO Contrast  Result Date: 07/05/2021 CLINICAL DATA:  Positive D-dimer level. EXAM: CT ANGIOGRAPHY CHEST WITH CONTRAST TECHNIQUE: Multidetector CT imaging of the  chest was performed using the standard protocol during bolus administration of intravenous contrast. Multiplanar CT image reconstructions and MIPs were obtained to evaluate the vascular anatomy. CONTRAST:  51mL OMNIPAQUE IOHEXOL 350 MG/ML SOLN COMPARISON:  None. FINDINGS:  Cardiovascular: Satisfactory opacification of the pulmonary arteries to the segmental level. No evidence of pulmonary embolism. Normal heart size. No pericardial effusion. Atherosclerosis of thoracic aorta is noted without aneurysm formation. Mediastinum/Nodes: No enlarged mediastinal, hilar, or axillary lymph nodes. Thyroid gland, trachea, and esophagus demonstrate no significant findings. Lungs/Pleura: No pneumothorax is noted. Mild bibasilar subsegmental atelectasis is noted. Multiple ill-defined nodular opacities are noted throughout both lungs with the largest measuring 13 mm in the right upper lobe. This is concerning for metastatic disease or possibly multifocal infection. Upper Abdomen: No acute abnormality. Musculoskeletal: No chest wall abnormality. No acute or significant osseous findings. Review of the MIP images confirms the above findings. IMPRESSION: No definite evidence of pulmonary embolus is noted. Multiple ill-defined nodular opacities are noted throughout both lungs, the largest measuring 13 mm in the right upper lobe. This is concerning for diffuse metastatic disease or possibly multifocal pneumonia. Clinical correlation and short-term follow-up CT scan may be performed. Mild bibasilar subsegmental atelectasis is noted. Aortic Atherosclerosis (ICD10-I70.0). Electronically Signed   By: Marijo Conception M.D.   On: 07/05/2021 11:44   MR PELVIS WO CONTRAST  Result Date: 07/05/2021 CLINICAL DATA:  Hip replacement, infection suspected EXAM: MRI PELVIS WITHOUT CONTRAST TECHNIQUE: Multiplanar multisequence MR imaging of the pelvis was performed. No intravenous contrast was administered. COMPARISON:  None. FINDINGS: Urinary Tract:   No abnormality visualized. Bowel:  Unremarkable visualized pelvic bowel loops. Vascular/Lymphatic: No pathologically enlarged lymph nodes. No significant vascular abnormality seen. Reproductive:  No mass or other significant abnormality Other:  None. Musculoskeletal: There are bilateral hip arthroplasties with associated susceptibility artifact. There are moderate right and trace left joint effusions. There is no visible marrow signal abnormality. Lower lumbar spine degenerative disc disease, partially visualized. There is intramuscular edema bilaterally involving the gluteus musculature and abductor muscles. 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 Moderate right and trace left joint effusions, with right-sided periprostatic fluid collection tracking posteriorly along the greater trochanter. No marrow signal changes to suggest osteomyelitis. If there is concern for infected hip joint, aspiration should be considered. Symmetric bilateral intramuscular edema involving the gluteal musculature and adductor muscles, consistent with non-specific myositis. No evidence of pyomyositis. Electronically Signed   By: Maurine Simmering M.D.   On: 07/05/2021 16:00   DG Chest Port 1 View  Result Date: 07/05/2021 CLINICAL DATA:  Weakness and shortness of breath EXAM: PORTABLE CHEST 1 VIEW COMPARISON:  Radiograph 07/03/2021 FINDINGS: Unchanged cardiomediastinal silhouette. Nodular opacities in the lungs are better seen on recent chest CT. Bibasilar atelectasis. No large effusion. No pneumothorax. No acute osseous abnormality. IMPRESSION: Nodular opacities in the lungs are better seen on recent chest CT. Bibasilar atelectasis. Electronically Signed   By: Maurine Simmering M.D.   On: 07/05/2021 13:45   ECHOCARDIOGRAM COMPLETE  Result Date: 07/05/2021    ECHOCARDIOGRAM REPORT   Patient Name:   Joshua Cole Date of Exam: 07/04/2021 Medical Rec #:  [id number]        Height:       70.0 in  Accession #:    [id number]       Weight:       249.3 lb Date of Birth:  05-31-1961       BSA:          2.292 m Patient Age:    7 years         BP:           107/62 mmHg Patient Gender: M  HR:           70 bpm. Exam Location:  [facility] Procedure: 2D Echo, Cardiac Doppler and Color Doppler Indications:     Bacteremia R78.81  History:         Patient has no prior history of Echocardiogram examinations. No                  heart history listed in medical file.  Sonographer:     Sherrie Sport Referring Phys:  [id number] Sidney Ace Diagnosing Phys: Kathlyn Sacramento MD  Sonographer Comments: Technically challenging study due to limited acoustic windows, suboptimal apical window and suboptimal parasternal window. IMPRESSIONS  1. Left ventricular ejection fraction, by estimation, is 55 to 60%. The left ventricle has normal function. Left ventricular endocardial border not optimally defined to evaluate regional wall motion. There is mild left ventricular hypertrophy. Left ventricular diastolic parameters are indeterminate.  2. Right ventricular systolic function is normal. The right ventricular size is normal. Tricuspid regurgitation signal is inadequate for assessing PA pressure.  3. Left atrial size was mildly dilated.  4. Right atrial size was mildly dilated.  5. The mitral valve is normal in structure. No evidence of mitral valve regurgitation. No evidence of mitral stenosis.  6. The aortic valve is normal in structure. Aortic valve regurgitation is not visualized. Mild aortic valve sclerosis is present, with no evidence of aortic valve stenosis.  7. Aortic dilatation noted. There is borderline dilatation of the aortic root and of the ascending aorta, measuring 38 mm.  8. The tricuspid was not well visualized. However, there is likely a mobile vergetation noted on the short axis images. Recommend a TEE. FINDINGS  Left Ventricle: Left ventricular ejection fraction, by estimation, is 55 to 60%. The left  ventricle has normal function. Left ventricular endocardial border not optimally defined to evaluate regional wall motion. The left ventricular internal cavity size was normal in size. There is mild left ventricular hypertrophy. Left ventricular diastolic parameters are indeterminate. Right Ventricle: The right ventricular size is normal. No increase in right ventricular wall thickness. Right ventricular systolic function is normal. Tricuspid regurgitation signal is inadequate for assessing PA pressure. Left Atrium: Left atrial size was mildly dilated. Right Atrium: Right atrial size was mildly dilated. Pericardium: There is no evidence of pericardial effusion. Mitral Valve: The mitral valve is normal in structure. There is mild calcification of the mitral valve leaflet(s). No evidence of mitral valve regurgitation. No evidence of mitral valve stenosis. Tricuspid Valve: The tricuspid valve is not well visualized. Tricuspid valve regurgitation is not demonstrated. No evidence of tricuspid stenosis. Aortic Valve: The aortic valve is normal in structure. Aortic valve regurgitation is not visualized. Mild aortic valve sclerosis is present, with no evidence of aortic valve stenosis. Aortic valve mean gradient measures 4.0 mmHg. Aortic valve peak gradient measures 7.7 mmHg. Aortic valve area, by VTI measures 3.66 cm. Pulmonic Valve: The pulmonic valve was normal in structure. Pulmonic valve regurgitation is not visualized. No evidence of pulmonic stenosis. Aorta: Aortic dilatation noted. There is borderline dilatation of the aortic root and of the ascending aorta, measuring 38 mm. Venous: The inferior vena cava was not well visualized. IAS/Shunts: No atrial level shunt detected by color flow Doppler.  LEFT VENTRICLE PLAX 2D LVIDd:         3.70 cm   Diastology LVIDs:         2.70 cm   LV e' medial:    5.55 cm/s LV PW:  1.30 cm   LV E/e' medial:  14.4 LV IVS:        0.95 cm   LV e' lateral:   12.50 cm/s LVOT diam:      2.20 cm   LV E/e' lateral: 6.4 LV SV:         67 LV SV Index:   29 LVOT Area:     3.80 cm  RIGHT VENTRICLE RV Basal diam:  3.70 cm LEFT ATRIUM           Index        RIGHT ATRIUM           Index LA diam:      4.30 cm 1.88 cm/m   RA Area:     21.80 cm LA Vol (A4C): 69.6 ml 30.36 ml/m  RA Volume:   77.30 ml  33.72 ml/m  AORTIC VALVE                    PULMONIC VALVE AV Area (Vmax):    2.64 cm     PV Vmax:        0.81 m/s AV Area (Vmean):   3.00 cm     PV Peak grad:   2.7 mmHg AV Area (VTI):     3.66 cm     RVOT Peak grad: 4 mmHg AV Vmax:           139.00 cm/s AV Vmean:          81.200 cm/s AV VTI:            0.182 m AV Peak Grad:      7.7 mmHg AV Mean Grad:      4.0 mmHg LVOT Vmax:         96.60 cm/s LVOT Vmean:        64.000 cm/s LVOT VTI:          0.175 m LVOT/AV VTI ratio: 0.96  AORTA Ao Root diam: 3.73 cm MITRAL VALVE               TRICUSPID VALVE MV Area (PHT): 2.30 cm    TR Peak grad:   9.4 mmHg MV Decel Time: 330 msec    TR Vmax:        153.00 cm/s MV E velocity: 79.70 cm/s MV A velocity: 90.00 cm/s  SHUNTS MV E/A ratio:  0.89        Systemic VTI:  0.18 m                            Systemic Diam: 2.20 cm Kathlyn Sacramento MD Electronically signed by Kathlyn Sacramento MD Signature Date/Time: 07/05/2021/12:50:50 PM    Final    ECHO TEE  Result Date: 07/07/2021    TRANSESOPHOGEAL ECHO REPORT   Patient Name:   Joshua Cole Date of Exam: 07/07/2021 Medical Rec #:  [id number]        Height:       70.0 in Accession #:    [id number]       Weight:       249.3 lb Date of Birth:  Jul 11, 1961       BSA:          2.292 m Patient Age:    48 years         BP:           113/61 mmHg Patient Gender: M  HR:           71 bpm. Exam Location:  [facility] Procedure: Transesophageal Echo, Cardiac Doppler and Color Doppler Indications:     bacteremia  History:         Patient has prior history of Echocardiogram examinations, most                  recent 07/04/2021. Bacteremia.  Sonographer:     Sherrie Sport Referring  Phys:  [id number] Nunez Diagnosing Phys: Kate Sable MD PROCEDURE: The transesophogeal probe was passed without difficulty through the esophogus of the patient. Sedation performed by performing physician. The patient developed no complications during the procedure. IMPRESSIONS  1. Left ventricular ejection fraction, by estimation, is 55 to 60%. The left ventricle has normal function.  2. Right ventricular systolic function is normal. The right ventricular size is normal.  3. No left atrial/left atrial appendage thrombus was detected.  4. The mitral valve is normal in structure. Mild mitral valve regurgitation.  5. There is a mobile mass attached to the tricuspid valve (clip 132) consistent with a vegetation given the current clinical context.. The tricuspid valve is degenerative.  6. The aortic valve is tricuspid. Aortic valve regurgitation is not visualized. 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 FINDINGS  Left Ventricle: Left ventricular ejection fraction, by estimation, is 55 to 60%. The left ventricle has normal function. The left ventricular internal cavity size was normal in size. Right Ventricle: The right ventricular size is normal. No increase in right ventricular wall thickness. Right ventricular systolic function is normal. Left Atrium: Left atrial size was normal in size. No left atrial/left atrial appendage thrombus was detected. Right Atrium: Right atrial size was normal in size. Pericardium: There is no evidence of pericardial effusion. Mitral Valve: The mitral valve is normal in structure. Mild mitral valve regurgitation. Tricuspid Valve: There is a mobile mass attached to the tricuspid valve (clip 132) consistent with a vegetation given the current clinical context. The tricuspid valve is degenerative in appearance. Tricuspid valve regurgitation is trivial. Aortic Valve: The aortic valve is tricuspid. Aortic  valve regurgitation is not visualized. Pulmonic Valve: The pulmonic valve was normal in structure. Pulmonic valve regurgitation is not visualized. Aorta: The aortic root is normal in size and structure. IAS/Shunts: No atrial level shunt detected by color flow Doppler. Kate Sable MD Electronically signed by Kate Sable MD Signature Date/Time: 07/07/2021/5:55:00 PM    Final      Assessment and Plan:   Tricuspid valve endocarditis Complete heart block  The patient has MSSA bacteremia with valvular involvement complicated by complete heart block.  Recommend evaluation for valve replacement by cardiothoracic surgery.  At the time of valve replacement, patient will require a permanent pacemaker system.  Recommend cardiothoracic surgery placed epicardial RA and RV leads at the time of valve replacement.  At this time, there is no indication for temporary transvenous pacemaker given the stable escape rhythm.  If the patient's escape slows over the course the hospitalization, he could require temporary transvenous pacing although I would like to avoid this if at all possible given the bacteremia.  This was discussed with the patient.  EP will sign off for now.  Please call us back with any questions.   For questions or updates, please contact [place] Please consult [email or web address] for contact info under    Signed, Vickie Epley, MD  07/07/2021 10:56 PM

## 2021-07-07 NOTE — Consult Note (Signed)
[facility]  CONSULT NOTE    Date: 07/07/2021                  Patient Name:  Joshua Cole  MRN: [id number]  DOB: 01-06-1961  Age / Sex: 60 y.o., male         PCP: Pcp, No                 Service Requesting Consult: Dr. Kurtis Bushman                 Reason for Consult: Hyponatremia            History of Present Illness: [name] admitted on 10/22. Patient recently had vaccines. He was intermittently confused. Blood cultures positive for OSSA. Nephrology consulted for persistent hyponatremia. No baseline labs available. Patient states he drinking 20 beers on a weekend. None during the week. Patient found to have new onset diabetes on this admission. Reports no use of prescribed medications. No use of diuretics.   Patient is scheduled for TEE later today. He states he feels thirsty.    Medications: Outpatient medications: No medications prior to admission.    Current medications: Current Facility-Administered Medications  Medication Dose Route Frequency Provider Last Rate Last Admin   0.9 %  sodium chloride infusion   Intravenous Continuous Furth, Cadence H, PA-C 20 mL/hr at 07/06/21 2331 Restarted at 07/06/21 2331   0.9 %  sodium chloride infusion   Intravenous Continuous Furth, Cadence H, PA-C 20 mL/hr at 07/07/21 0546 Infusion Verify at 07/07/21 0546   acetaminophen (TYLENOL) tablet 650 mg  650 mg Oral Q6H PRN Ivor Costa, MD   650 mg at 07/05/21 1711   ceFAZolin (ANCEF) IVPB 2g/100 mL premix  2 g Intravenous Q8H Renda Rolls, RPH 200 mL/hr at 07/07/21 0627 2 g at 07/07/21 0627   gadobutrol (GADAVIST) 1 MMOL/ML injection 10 mL  10 mL Intravenous Once PRN Ralene Muskrat B, MD       insulin aspart (novoLOG) injection 0-20 Units  0-20 Units Subcutaneous TID WC Ralene Muskrat B, MD   7 Units at 07/07/21 0915   insulin aspart (novoLOG) injection 0-5 Units  0-5 Units Subcutaneous QHS Ivor Costa, MD   3 Units at 07/06/21 2302   insulin aspart  (novoLOG) injection 6 Units  6 Units Subcutaneous TID WC Ralene Muskrat B, MD   6 Units at 07/06/21 1801   insulin glargine-yfgn (SEMGLEE) injection 23 Units  23 Units Subcutaneous QHS Sreenath, Sudheer B, MD       morphine 2 MG/ML injection 2 mg  2 mg Intravenous Q3H PRN Ralene Muskrat B, MD   2 mg at 07/06/21 2022   multivitamin with minerals tablet 1 tablet  1 tablet Oral Daily Ralene Muskrat B, MD   1 tablet at 07/06/21 1212   ondansetron (ZOFRAN) injection 4 mg  4 mg Intravenous Q8H PRN Ivor Costa, MD       oxyCODONE (Oxy IR/ROXICODONE) immediate release tablet 5 mg  5 mg Oral Q4H PRN Ralene Muskrat B, MD   5 mg at 07/04/21 2159   pantoprazole (PROTONIX) EC tablet 40 mg  40 mg Oral Daily Lorna Dibble, RPH   40 mg at 07/06/21 1349      Allergies: No Known Allergies    Past Medical History: Past Medical History:  Diagnosis Date   Arthritis    DJD (degenerative joint disease)    Gout      Past Surgical History:  Past Surgical History:  Procedure Laterality Date   JOINT REPLACEMENT       Family History: Family History  Problem Relation Age of Onset   Psoriasis Sister      Social History: Social History   Socioeconomic History   Marital status: Unknown    Spouse name: Not on file   Number of children: Not on file   Years of education: Not on file   Highest education level: Not on file  Occupational History   Not on file  Tobacco Use   Smoking status: Never   Smokeless tobacco: Never  Substance and Sexual Activity   Alcohol use: Yes    Comment: bilateral hip replacement   Drug use: Never   Sexual activity: Not on file  Other Topics Concern   Not on file  Social History Narrative   Not on file   Social Determinants of Health   Financial Resource Strain: Not on file  Food Insecurity: Not on file  Transportation Needs: Not on file  Physical Activity: Not on file  Stress: Not on file  Social Connections: Not on file  Intimate Partner  Violence: Not on file     Review of Systems: Review of Systems  Constitutional: Negative.   HENT: Negative.    Eyes: Negative.   Respiratory:  Negative for cough, hemoptysis, sputum production, shortness of breath and wheezing.   Cardiovascular:  Positive for leg swelling. Negative for chest pain, palpitations, orthopnea, claudication and PND.  Gastrointestinal: Negative.   Genitourinary:  Negative for dysuria, flank pain, frequency, hematuria and urgency.  Musculoskeletal:  Negative for back pain, falls, joint pain, myalgias and neck pain.  Skin: Negative.   Neurological: Negative.   Endo/Heme/Allergies: Negative.   Psychiatric/Behavioral: Negative.     Vital Signs: Blood pressure (!) 129/59, pulse 64, temperature 98.5 F (36.9 C), temperature source Oral, resp. rate (!) 24, height 5\' 10"  (1.778 m), weight 113.1 kg, SpO2 96 %.  Weight trends: Filed Weights   07/03/21 0954 07/03/21 1145  Weight: 105.2 kg 113.1 kg    Physical Exam: General: NAD,   Head: Normocephalic, atraumatic. Moist oral mucosal membranes  Eyes: Anicteric, PERRL  Neck: Supple, trachea midline  Lungs:  Clear to auscultation  Heart: Regular rate and rhythm, obese  Abdomen:  Soft, nontender,   Extremities:  trace peripheral edema.  Neurologic: Nonfocal, moving all four extremities  Skin: No lesions        Lab results: Basic Metabolic Panel: Recent Labs  Lab 07/03/21 1655 07/03/21 2301 07/05/21 0747 07/06/21 0814 07/07/21 0445  NA 124*   < > 127* 125* 124*  K 3.4*   < > 3.7 3.4* 3.8  CL 85*   < > 92* 91* 90*  CO2 27   < > 27 25 26   GLUCOSE 267*   < > 282* 230* 235*  BUN 31*   < > 30* 36* 27*  CREATININE 1.19   < > 0.87 1.04 0.91  CALCIUM 8.1*   < > 7.8* 7.5* 7.6*  MG 2.2  --   --   --   --   PHOS 3.4  --   --   --   --    < > = values in this interval not displayed.    Liver Function Tests: No results for input(s): AST, ALT, ALKPHOS, BILITOT, PROT, ALBUMIN in the last 168 hours. No  results for input(s): LIPASE, AMYLASE in the last 168 hours. No results for input(s): AMMONIA in the last  168 hours.  CBC: Recent Labs  Lab 07/03/21 1655 07/04/21 0506 07/05/21 0747 07/06/21 0814 07/07/21 0445  WBC 13.1* 16.2* 15.9* 13.8* 14.4*  NEUTROABS 11.5*  --  13.3* 11.5* 11.9*  HGB 15.1 14.3 12.6* 12.6* 11.9*  HCT 41.9 38.7* 34.6* 35.2* 32.4*  MCV 91.1 94.2 94.0 93.4 92.3  PLT 67* 59* 71* 57* 78*    Cardiac Enzymes: No results for input(s): CKTOTAL, CKMB, CKMBINDEX, TROPONINI in the last 168 hours.  BNP: Invalid input(s): POCBNP  CBG: Recent Labs  Lab 07/06/21 0754 07/06/21 1103 07/06/21 1627 07/06/21 2110 07/07/21 0759  GLUCAP 239* 173* 202* 298* 235*    Microbiology: Results for orders placed or performed during the hospital encounter of 07/03/21  Resp Panel by RT-PCR (Flu A&B, Covid) Nasopharyngeal Swab     Status: None   Collection Time: 07/03/21 12:08 PM   Specimen: Nasopharyngeal Swab; Nasopharyngeal(NP) swabs in vial transport medium  Result Value Ref Range Status   SARS Coronavirus 2 by RT PCR NEGATIVE NEGATIVE Final    Comment: (NOTE) SARS-CoV-2 target nucleic acids are NOT DETECTED.  The SARS-CoV-2 RNA is generally detectable in upper respiratory specimens during the acute phase of infection. The lowest concentration of SARS-CoV-2 viral copies this assay can detect is 138 copies/mL. A negative result does not preclude SARS-Cov-2 infection and should not be used as the sole basis for treatment or other patient management decisions. A negative result may occur with  improper specimen collection/handling, submission of specimen other than nasopharyngeal swab, presence of viral mutation(s) within the areas targeted by this assay, and inadequate number of viral copies(<138 copies/mL). A negative result must be combined with clinical observations, patient history, and epidemiological information. The expected result is Negative.  Fact Sheet for  Patients:  EntrepreneurPulse.com.au  Fact Sheet for Healthcare Providers:  IncredibleEmployment.be  This test is no t yet approved or cleared by the [place] FDA and  has been authorized for detection and/or diagnosis of SARS-CoV-2 by FDA under an Emergency Use Authorization (EUA). This EUA will remain  in effect (meaning this test can be used) for the duration of the COVID-19 declaration under Section 564(b)(1) of the Act, 21 U.S.C.section 360bbb-3(b)(1), unless the authorization is terminated  or revoked sooner.       Influenza A by PCR NEGATIVE NEGATIVE Final   Influenza B by PCR NEGATIVE NEGATIVE Final    Comment: (NOTE) The Xpert Xpress SARS-CoV-2/FLU/RSV plus assay is intended as an aid in the diagnosis of influenza from Nasopharyngeal swab specimens and should not be used as a sole basis for treatment. Nasal washings and aspirates are unacceptable for Xpert Xpress SARS-CoV-2/FLU/RSV testing.  Fact Sheet for Patients: EntrepreneurPulse.com.au  Fact Sheet for Healthcare Providers: IncredibleEmployment.be  This test is not yet approved or cleared by the [place] FDA and has been authorized for detection and/or diagnosis of SARS-CoV-2 by FDA under an Emergency Use Authorization (EUA). This EUA will remain in effect (meaning this test can be used) for the duration of the COVID-19 declaration under Section 564(b)(1) of the Act, 21 U.S.C. section 360bbb-3(b)(1), unless the authorization is terminated or revoked.  Performed at [facility], [address], [place], [place] [address]   Urine Culture     Status: Abnormal   Collection Time: 07/03/21  3:09 PM   Specimen: Urine, Random  Result Value Ref Range Status   Specimen Description   Final    URINE, RANDOM Performed at [facility], [address], [place], [place] [address]  Special Requests   Final     NONE Performed at [facility], [address] [place], [place] [address]    Culture MULTIPLE SPECIES PRESENT, SUGGEST RECOLLECTION (A)  Final   Report Status 07/05/2021 FINAL  Final  Culture, blood (x 2)     Status: Abnormal   Collection Time: 07/03/21  4:55 PM   Specimen: BLOOD  Result Value Ref Range Status   Specimen Description   Final    BLOOD LEFT ANTECUBITAL Performed at [facility], [address] [place], [place] [address]    Special Requests   Final    BOTTLES DRAWN AEROBIC AND ANAEROBIC Blood Culture results may not be optimal due to an excessive volume of blood received in culture bottles Performed at [facility], [address] [place], [place] [address]    Culture  Setup Time   Final    IN BOTH AEROBIC AND ANAEROBIC BOTTLES GRAM POSITIVE COCCI CRITICAL RESULT CALLED TO, READ BACK BY AND VERIFIED WITH: NATHAN BELUE AT 4944 07/04/21.PMF Performed at [facility], [place] [address], [place],  [address]    Culture STAPHYLOCOCCUS AUREUS (A)  Final   Report Status 07/06/2021 FINAL  Final   Organism ID, Bacteria STAPHYLOCOCCUS AUREUS  Final      Susceptibility   Staphylococcus aureus - MIC*    CIPROFLOXACIN <=0.5 SENSITIVE Sensitive     ERYTHROMYCIN <=0.25 SENSITIVE Sensitive     GENTAMICIN <=0.5 SENSITIVE Sensitive     OXACILLIN <=0.25 SENSITIVE Sensitive     TETRACYCLINE <=1 SENSITIVE Sensitive     VANCOMYCIN <=0.5 SENSITIVE Sensitive     TRIMETH/SULFA <=10 SENSITIVE Sensitive     CLINDAMYCIN <=0.25 SENSITIVE Sensitive     RIFAMPIN <=0.5 SENSITIVE Sensitive     Inducible Clindamycin NEGATIVE Sensitive     * STAPHYLOCOCCUS AUREUS  Blood Culture ID Panel (Reflexed)     Status: Abnormal   Collection Time: 07/03/21  4:55 PM  Result Value Ref Range Status   Enterococcus faecalis NOT DETECTED NOT DETECTED Final   Enterococcus Faecium NOT DETECTED NOT DETECTED Final   Listeria monocytogenes NOT DETECTED NOT DETECTED Final    Staphylococcus species DETECTED (A) NOT DETECTED Final    Comment: CRITICAL RESULT CALLED TO, READ BACK BY AND VERIFIED WITH: NATHAN BELUE AT 1638 07/04/21.PMF    Staphylococcus aureus (BCID) DETECTED (A) NOT DETECTED Final    Comment: CRITICAL RESULT CALLED TO, READ BACK BY AND VERIFIED WITH: NATHAN BELUE AT 4665 07/04/21.PMF    Staphylococcus epidermidis NOT DETECTED NOT DETECTED Final   Staphylococcus lugdunensis NOT DETECTED NOT DETECTED Final   Streptococcus species NOT DETECTED NOT DETECTED Final   Streptococcus agalactiae NOT DETECTED NOT DETECTED Final   Streptococcus pneumoniae NOT DETECTED NOT DETECTED Final   Streptococcus pyogenes NOT DETECTED NOT DETECTED Final   A.calcoaceticus-baumannii NOT DETECTED NOT DETECTED Final   Bacteroides fragilis NOT DETECTED NOT DETECTED Final   Enterobacterales NOT DETECTED NOT DETECTED Final   Enterobacter cloacae complex NOT DETECTED NOT DETECTED Final   Escherichia coli NOT DETECTED NOT DETECTED Final   Klebsiella aerogenes NOT DETECTED NOT DETECTED Final   Klebsiella oxytoca NOT DETECTED NOT DETECTED Final   Klebsiella pneumoniae NOT DETECTED NOT DETECTED Final   Proteus species NOT DETECTED NOT DETECTED Final   Salmonella species NOT DETECTED NOT DETECTED Final   Serratia marcescens NOT DETECTED NOT DETECTED Final   Haemophilus influenzae NOT DETECTED NOT DETECTED Final   Neisseria meningitidis NOT DETECTED NOT DETECTED Final  Pseudomonas aeruginosa NOT DETECTED NOT DETECTED Final   Stenotrophomonas maltophilia NOT DETECTED NOT DETECTED Final   Candida albicans NOT DETECTED NOT DETECTED Final   Candida auris NOT DETECTED NOT DETECTED Final   Candida glabrata NOT DETECTED NOT DETECTED Final   Candida krusei NOT DETECTED NOT DETECTED Final   Candida parapsilosis NOT DETECTED NOT DETECTED Final   Candida tropicalis NOT DETECTED NOT DETECTED Final   Cryptococcus neoformans/gattii NOT DETECTED NOT DETECTED Final   Meth resistant mecA/C  and MREJ NOT DETECTED NOT DETECTED Final    Comment: Performed at [facility], [address] [place], [place] [address]  Culture, blood (x 2)     Status: Abnormal   Collection Time: 07/03/21  6:37 PM   Specimen: BLOOD  Result Value Ref Range Status   Specimen Description   Final    BLOOD RIGHT HAND Performed at [facility], [address], [place], [place] [address]    Special Requests   Final    BOTTLES DRAWN AEROBIC AND ANAEROBIC Blood Culture adequate volume Performed at [facility], [address] [place], [place] [address]    Culture  Setup Time   Final    GRAM POSITIVE COCCI IN BOTH AEROBIC AND ANAEROBIC BOTTLES CRITICAL RESULT CALLED TO, READ BACK BY AND VERIFIED WITH: NATHAN BELUE AT 4166 07/04/21.PMF Performed at [facility], [address] [place], [place] [address]    Culture (A)  Final    STAPHYLOCOCCUS AUREUS SUSCEPTIBILITIES PERFORMED ON PREVIOUS CULTURE WITHIN THE LAST 5 DAYS. Performed at [facility], [place] [address] [place], [place] [address]    Report Status 07/06/2021 FINAL  Final  CULTURE, BLOOD (ROUTINE X 2) w Reflex to ID Panel     Status: None (Preliminary result)   Collection Time: 07/05/21  7:47 AM   Specimen: BLOOD  Result Value Ref Range Status   Specimen Description BLOOD RIGHT WRIST  Final   Special Requests   Final    BOTTLES DRAWN AEROBIC AND ANAEROBIC Blood Culture adequate volume   Culture   Final    NO GROWTH 2 DAYS Performed at [facility], [address] [place], [place] [address]    Report Status PENDING  Incomplete  CULTURE, BLOOD (ROUTINE X 2) w Reflex to ID Panel     Status: None (Preliminary result)   Collection Time: 07/05/21  7:47 AM   Specimen: BLOOD  Result Value Ref Range Status   Specimen Description BLOOD BLOOD RIGHT HAND  Final   Special Requests   Final    BOTTLES DRAWN AEROBIC ONLY Blood Culture adequate volume   Culture   Final    NO GROWTH 2  DAYS Performed at [facility], [address] [place], [place] [address]    Report Status PENDING  Incomplete    Coagulation Studies: No results for input(s): LABPROT, INR in the last 72 hours.  Urinalysis: No results for input(s): COLORURINE, LABSPEC, PHURINE, GLUCOSEU, HGBUR, BILIRUBINUR, KETONESUR, PROTEINUR, UROBILINOGEN, NITRITE, LEUKOCYTESUR in the last 72 hours.  Invalid input(s): APPERANCEUR    Imaging: CT Angio Chest Pulmonary Embolism (PE) W or WO Contrast  Result Date: 07/05/2021 CLINICAL DATA:  Positive D-dimer level. EXAM: CT ANGIOGRAPHY CHEST WITH CONTRAST TECHNIQUE: Multidetector CT imaging of the chest was performed using the standard protocol during bolus administration of intravenous contrast. Multiplanar CT image reconstructions and MIPs were obtained to evaluate the vascular anatomy. CONTRAST:  73mL OMNIPAQUE IOHEXOL 350 MG/ML SOLN COMPARISON:  None. FINDINGS:  Cardiovascular: Satisfactory opacification of the pulmonary arteries to the segmental level. No evidence of pulmonary embolism. Normal heart size. No pericardial effusion. Atherosclerosis of thoracic aorta is noted without aneurysm formation. Mediastinum/Nodes: No enlarged mediastinal, hilar, or axillary lymph nodes. Thyroid gland, trachea, and esophagus demonstrate no significant findings. Lungs/Pleura: No pneumothorax is noted. Mild bibasilar subsegmental atelectasis is noted. Multiple ill-defined nodular opacities are noted throughout both lungs with the largest measuring 13 mm in the right upper lobe. This is concerning for metastatic disease or possibly multifocal infection. Upper Abdomen: No acute abnormality. Musculoskeletal: No chest wall abnormality. No acute or significant osseous findings. Review of the MIP images confirms the above findings. IMPRESSION: No definite evidence of pulmonary embolus is noted. Multiple ill-defined nodular opacities are noted throughout both lungs, the largest measuring  13 mm in the right upper lobe. This is concerning for diffuse metastatic disease or possibly multifocal pneumonia. Clinical correlation and short-term follow-up CT scan may be performed. Mild bibasilar subsegmental atelectasis is noted. Aortic Atherosclerosis (ICD10-I70.0). Electronically Signed   By: Marijo Conception M.D.   On: 07/05/2021 11:44   MR PELVIS WO CONTRAST  Result Date: 07/05/2021 CLINICAL DATA:  Hip replacement, infection suspected EXAM: MRI PELVIS WITHOUT CONTRAST TECHNIQUE: Multiplanar multisequence MR imaging of the pelvis was performed. No intravenous contrast was administered. COMPARISON:  None. FINDINGS: Urinary Tract:  No abnormality visualized. Bowel:  Unremarkable visualized pelvic bowel loops. Vascular/Lymphatic: No pathologically enlarged lymph nodes. No significant vascular abnormality seen. Reproductive:  No mass or other significant abnormality Other:  None. Musculoskeletal: There are bilateral hip arthroplasties with associated susceptibility artifact. There are moderate right and trace left joint effusions. There is no visible marrow signal abnormality. Lower lumbar spine degenerative disc disease, partially visualized. There is intramuscular edema bilaterally involving the gluteus musculature and abductor muscles. 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 Moderate right and trace left joint effusions, with right-sided periprostatic fluid collection tracking posteriorly along the greater trochanter. No marrow signal changes to suggest osteomyelitis. If there is concern for infected hip joint, aspiration should be considered. Symmetric bilateral intramuscular edema involving the gluteal musculature and adductor muscles, consistent with non-specific myositis. No evidence of pyomyositis. Electronically Signed   By: Maurine Simmering M.D.   On: 07/05/2021 16:00     Assessment & Plan: Mr. Tavian Callander is a 60 y.o. white male with alcohol abuse,  osteoarthritis, who was admitted to [facility] on 07/03/2021 for Hyponatremia [E87.1] Weakness [R53.1]  Hyponatremia: with normal osmolarity on admission. History suggestive of alcohol abuse. Labs are suggestive of hepatic cirrhosis or cirrhosis.  - Check TSH, lipid panel, SPEP/UPEP, hepatic function panel.  - Start fluid restriction - low threshold to start loop diuretics     LOS: 4 Emmalea Treanor 10/26/202210:59 AM

## 2021-07-07 NOTE — Discharge Instructions (Signed)

## 2021-07-07 NOTE — Progress Notes (Signed)
Brief Nutrition Education Note  RD received consult for education on new onset DM.   Pt currently down for TEE and is NPO for procedure. Pt unavailable for education at this time.  RD provided "Carbohydrate Counting For People with DIabetes" and "Plate Method" handouts from [facility] Nutrition Care Manual. RD also provided referral to [facility] for further reinforcement.  RD will re-attempt to provide education as times allows.   Loistine Chance, RD, LDN, [place] Registered Dietitian II Certified Diabetes Care and Education Specialist Please refer to [facility] for RD and/or RD on-call/weekend/after hours pager

## 2021-07-07 NOTE — Procedures (Signed)
Transesophageal Echocardiogram :  Indication: bacteremia   Procedure: 10 ml of viscous lidocaine were given orally to provide local anesthesia to the oropharynx. The patient was positioned supine on the left side, bite block provided. The patient was moderately sedated with the doses of versed and fentanyl as detailed below.  Using digital technique an omniplane probe was advanced into the esophagus without incident.   Moderate sedation: 1. Sedation used:  Versed: 1mg , Fentanyl: 68mcg 2. Time administered:   1335  Time when patient started recovery: 1405 3. I was face to face during this time 30  See report in EPIC  for complete details: In brief, imaging revealed normal LV function with no RWMAs and no mural apical thrombus.  .  Estimated ejection fraction was 55%.  Right sided cardiac chambers were normal with no evidence of pulmonary hypertension.  There is a mass in the tricuspid valve consistent with vegetation given current clinical context.  Imaging of the septum showed no ASD or VSD 2D and color flow confirmed no PFO  The LA was well visualized in orthogonal views.  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    Joshua Cole 07/07/2021 2:37 PM

## 2021-07-07 NOTE — Progress Notes (Addendum)
Physical Therapy Treatment Patient Details Name: Joshua Cole MRN: [id number] DOB: 07-05-1961 Today's Date: 07/07/2021   History of Present Illness Pt is a 60 y/o M admitted on 07/03/21 with c/c of weakness, joint pain, confusion, N&V, diarrhea & fever. Pt received flu shot & covid booster on Friday. On Saturday night pt developed fever & chills. Pt was seen in Urgent care on 10/20 & tested negative for flu & covid but was started on tamiflu & steroid. Pt is currently being treated for hyponatremia & acute metabolic encephalopathy of unclear etiology. Brain MRI was ordered but was negative for acute processes. Blood cultures positive for Staph aureus. PMH: gout, alcohol use    PT Comments    Pt in bed, stating low energy levels and minimal pain at rest but increases w/ activity to 8/10, decreases in seated position. Tactile assist to LE for muscle assistance to mobilize abd/add for coming OOB. Pt able to utilize LE musculature with counterforce resistance for muscle activation. Pt required MOD-A for bed mobility for trunk but is able to reposition in bed and scoot with encouragement. Performed 2 STS w/ MIN G w/ RW for stability and able to step forwards, backwards and sideways w/ RW and good overall stability. Primary limitations remain generalized weakness particularly to the L UE, reduced energy, endurance, and pain. PT to continue working with pt on improving functional impairments with further ambulation as able. discharge recommendations remain SNF to maximize functional mobility. Skilled PT intervention is indicated to address deficits in function, mobility, and to return to PLOF as able.                              Recommendations for follow up therapy are one component of a multi-disciplinary discharge planning process, led by the attending physician.  Recommendations may be updated based on patient status, additional functional criteria and insurance authorization.  Follow Up  Recommendations  Skilled nursing-short term rehab (<3 hours/day)     Assistance Recommended at Discharge Intermittent Supervision/Assistance  Equipment Recommendations  Rolling walker (2 wheels)    Recommendations for Other Services       Precautions / Restrictions Precautions Precautions: Fall Restrictions Weight Bearing Restrictions: No     Mobility  Bed Mobility Overal bed mobility: Needs Assistance Bed Mobility: Supine to Sit;Sit to Supine     Supine to sit: HOB elevated;Mod assist Sit to supine: Mod assist   General bed mobility comments: Trunk assist due to L arm limited ROM, strength; BLE returning to bed; pt was able to scoot to [facility] without physical assist, unable to lift hips off bed    Transfers Overall transfer level: Needs assistance Equipment used: Rolling walker (2 wheels) Transfers: Sit to/from Stand Sit to Stand: Min guard;From elevated surface           General transfer comment: Cues for hand placement, elevated bed height    Ambulation/Gait Ambulation/Gait assistance: Min guard Gait Distance (Feet): 2 Feet Assistive device: Rolling walker (2 wheels)       General Gait Details: Able to step forward and side to side w/ RW, MIN-G for safety. Further amb deferred secondary to medical team arriving   Stairs             Wheelchair Mobility    Modified Rankin (Stroke Patients Only)       Balance Overall balance assessment: Needs assistance Sitting-balance support: Feet supported;No upper extremity supported Sitting balance-Leahy Scale: Good  Standing balance support: Bilateral upper extremity supported;During functional activity Standing balance-Leahy Scale: Poor Standing balance comment: BUE support due to weakness and stepping                            Cognition Arousal/Alertness: Awake/alert Behavior During Therapy: WFL for tasks assessed/performed Overall Cognitive Status: Within Functional Limits for  tasks assessed                                 General Comments: oriented x 4, names and discusses family members; follows 1 step commands without reiterations        Exercises      General Comments General comments (skin integrity, edema, etc.): 2L O2 spO2 > 93% throughout treatment, HR 80s      Pertinent Vitals/Pain Pain Assessment: 0-10 Pain Score: 8  Pain Location: L shoulder, bilat hip joints w/ movement Pain Descriptors / Indicators: Aching;Sore Pain Intervention(s): Limited activity within patient's tolerance;Monitored during session;Repositioned    Home Living                          Prior Function            PT Goals (current goals can now be found in the care plan section) Progress towards PT goals: Progressing toward goals    Frequency    Min 2X/week      PT Plan Current plan remains appropriate    Co-evaluation              AM-PAC PT "6 Clicks" Mobility   Outcome Measure  Help needed turning from your back to your side while in a flat bed without using bedrails?: A Little Help needed moving from lying on your back to sitting on the side of a flat bed without using bedrails?: A Lot Help needed moving to and from a bed to a chair (including a wheelchair)?: A Little Help needed standing up from a chair using your arms (e.g., wheelchair or bedside chair)?: A Little Help needed to walk in hospital room?: A Lot Help needed climbing 3-5 steps with a railing? : A Lot 6 Click Score: 15    End of Session Equipment Utilized During Treatment: Gait belt;Oxygen Activity Tolerance: Patient tolerated treatment well Patient left: with call bell/phone within reach;in bed;with bed alarm set Nurse Communication: Mobility status PT Visit Diagnosis: Difficulty in walking, not elsewhere classified (R26.2);Muscle weakness (generalized) (M62.81)     Time: 9935-7017 PT Time Calculation (min) (ACUTE ONLY): 27 min  Charges:                         [employer], SPT

## 2021-07-07 NOTE — Progress Notes (Addendum)
Date of Admission:  07/03/2021      ID: Joshua Cole is a 60 y.o. 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  Subjective: Patient says he is feeling little better Still his body is sore. Achiness over the shoulders area and over his right index finger area.   Medications:   insulin aspart  0-20 Units Subcutaneous TID WC   insulin aspart  0-5 Units Subcutaneous QHS   insulin aspart  6 Units Subcutaneous TID WC   insulin glargine-yfgn  23 Units Subcutaneous QHS   multivitamin with minerals  1 tablet Oral Daily   pantoprazole  40 mg Oral Daily    Objective: Vital signs in last 24 hours: Temp:  [98.1 F (36.7 C)-99.2 F (37.3 C)] 98.5 F (36.9 C) (10/26 0800) Pulse Rate:  [56-78] 64 (10/26 0800) Resp:  [16-24] 24 (10/26 0346) BP: (129-145)/(54-62) 129/59 (10/26 0800) SpO2:  [90 %-96 %] 96 % (10/26 0800)  PHYSICAL EXAM:  General: Alert, cooperative, tired,  Head: Normocephalic, without obvious abnormality, atraumatic. Eyes: Conjunctivae clear, anicteric sclerae. Pupils are equal ENT Nares normal. No drainage or sinus tenderness. Lips, mucosa, and tongue normal. No Thrush Poor dentition Neck: Supple, symmetrical, no adenopathy, thyroid: non tender no carotid bruit and no JVD. Back: No CVA tenderness. Lungs: Clear to auscultation bilaterally. No Wheezing or Rhonchi. No rales. Heart: Regular rate and rhythm, no murmur, rub or gallop. Abdomen: Soft, non-tender,not distended. Bowel sounds normal. No masses Extremities: Right index finger M CP joint  swollen and slightly erythematous and tender to touch     skin: No rashes or lesions. Or bruising Lymph: Cervical, supraclavicular normal. Neurologic: Grossly non-focal  Lab Results Recent Labs    07/06/21 0814 07/07/21 0445  WBC 13.8* 14.4*  HGB 12.6* 11.9*  HCT 35.2* 32.4*  NA 125* 124*  K 3.4* 3.8  CL 91* 90*  CO2 25 26  BUN 36* 27*  CREATININE 1.04 0.91   Liver Panel No results for input(s): PROT, ALBUMIN, AST, ALT, ALKPHOS, BILITOT, BILIDIR, IBILI in the last 72 hours. Sedimentation Rate Recent Labs    07/06/21 0814  ESRSEDRATE 51*   C-Reactive Protein Recent Labs    07/05/21 1309  CRP 21.2*    Microbiology:  Studies/Results: MR PELVIS WO CONTRAST  Result Date: 07/05/2021 CLINICAL DATA:  Hip replacement, infection suspected EXAM: MRI PELVIS WITHOUT CONTRAST TECHNIQUE: Multiplanar multisequence MR imaging of the pelvis was performed. No intravenous contrast was administered. COMPARISON:  None. FINDINGS: Urinary Tract:  No abnormality visualized. Bowel:  Unremarkable visualized pelvic bowel loops. Vascular/Lymphatic: No pathologically enlarged lymph nodes. No significant vascular abnormality seen. Reproductive:  No mass or other significant abnormality Other:  None. Musculoskeletal: There are bilateral hip arthroplasties with associated susceptibility artifact. There are moderate right and trace left joint effusions. There is no visible marrow signal abnormality. Lower lumbar spine degenerative disc disease, partially visualized. There is intramuscular edema bilaterally involving the gluteus musculature and abductor muscles. 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 Moderate right and trace left joint effusions, with right-sided periprostatic fluid collection tracking posteriorly  along the greater trochanter. No marrow signal changes to suggest osteomyelitis. If there is concern for infected hip joint, aspiration should be  considered. Symmetric bilateral intramuscular edema involving the gluteal musculature and adductor muscles, consistent with non-specific myositis. No evidence of pyomyositis. Electronically Signed   By: Maurine Simmering M.D.   On: 07/05/2021 16:00     Assessment/Plan: MSSA bacteremia 2 d echo shows possible tricuspid vegetation TEE confirms tricuspid valve vegetation. 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 He denies IV drug use Will get a urine tox screen   Had generalized body ache.  Could be a combination of infection and post COVID vaccination and gout.  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

## 2021-07-07 NOTE — Progress Notes (Signed)
Progress Note  Patient Name: Joshua Cole Date of Encounter: 07/07/2021  [place] Cardiologist: None   Subjective   Acute events overnight, feels tired, ready for TEE later today.  Denies fevers or chills.  Overall feels weak and tired.  Inpatient Medications    Scheduled Meds:  butamben-tetracaine-benzocaine       fentaNYL       insulin aspart  0-20 Units Subcutaneous TID WC   insulin aspart  0-5 Units Subcutaneous QHS   insulin aspart  6 Units Subcutaneous TID WC   insulin glargine-yfgn  23 Units Subcutaneous QHS   midazolam       multivitamin with minerals  1 tablet Oral Daily   pantoprazole  40 mg Oral Daily   sodium chloride flush       Continuous Infusions:  sodium chloride 20 mL/hr at 07/06/21 2331   sodium chloride 20 mL/hr at 07/07/21 0546    ceFAZolin (ANCEF) IV 2 g (07/07/21 0627)   PRN Meds: acetaminophen, gadobutrol, morphine injection, ondansetron (ZOFRAN) IV, oxyCODONE   Vital Signs    Vitals:   07/07/21 1355 07/07/21 1400 07/07/21 1405 07/07/21 1410  BP: 114/61 (!) 126/57 (!) 108/45 (!) 108/59  Pulse: 76 76 76 73  Resp: 18 18 16  (!) 32  Temp:      TempSrc:      SpO2: 94% 93% 93% 90%  Weight:      Height:        Intake/Output Summary (Last 24 hours) at 07/07/2021 1419 Last data filed at 07/07/2021 0546 Gross per 24 hour  Intake 947.83 ml  Output 800 ml  Net 147.83 ml   Last 3 Weights 07/03/2021 07/03/2021  Weight (lbs) 249 lb 4.8 oz 232 lb  Weight (kg) 113.082 kg 105.235 kg      Telemetry    AV block- Personally Reviewed  ECG    A-V dissociation/third-degree heart block.- Personally Reviewed  Physical Exam   GEN: No acute distress.   Neck: No JVD Cardiac: RRR, no murmurs, rubs, or gallops.  Respiratory: Clear to auscultation bilaterally. GI: Soft, nontender, non-distended  MS: No edema; No deformity. Neuro:  Nonfocal  Psych: Normal affect   Labs    High Sensitivity Troponin:  No results for input(s):  TROPONINIHS in the last 720 hours.   Chemistry Recent Labs  Lab 07/03/21 1655 07/03/21 2301 07/05/21 0747 07/06/21 0814 07/07/21 0445 07/07/21 1213  NA 124*   < > 127* 125* 124*  --   K 3.4*   < > 3.7 3.4* 3.8  --   CL 85*   < > 92* 91* 90*  --   CO2 27   < > 27 25 26   --   GLUCOSE 267*   < > 282* 230* 235*  --   BUN 31*   < > 30* 36* 27*  --   CREATININE 1.19   < > 0.87 1.04 0.91  --   CALCIUM 8.1*   < > 7.8* 7.5* 7.6*  --   MG 2.2  --   --   --   --   --   PROT  --   --   --   --   --  6.8  ALBUMIN  --   --   --   --   --  1.9*  AST  --   --   --   --   --  27  ALT  --   --   --   --   --  16  ALKPHOS  --   --   --   --   --  64  BILITOT  --   --   --   --   --  1.4*  GFRNONAA >60   < > >60 >60 >60  --   ANIONGAP 12   < > 8 9 8   --    < > = values in this interval not displayed.    Lipids No results for input(s): CHOL, TRIG, HDL, LABVLDL, LDLCALC, CHOLHDL in the last 168 hours.  Hematology Recent Labs  Lab 07/05/21 0747 07/06/21 0814 07/07/21 0445  WBC 15.9* 13.8* 14.4*  RBC 3.68* 3.77* 3.51*  HGB 12.6* 12.6* 11.9*  HCT 34.6* 35.2* 32.4*  MCV 94.0 93.4 92.3  MCH 34.2* 33.4 33.9  MCHC 36.4* 35.8 36.7*  RDW 12.2 12.8 12.5  PLT 71* 57* 78*   Thyroid No results for input(s): TSH, FREET4 in the last 168 hours.  BNP Recent Labs  Lab 07/05/21 0747  BNP 219.6*    DDimer  Recent Labs  Lab 07/04/21 0506  DDIMER 3.78*     Radiology    No results found.  Cardiac Studies   Transthoracic echocardiogram 06/2019 first 2022, EF 55 to 60%, possible tricuspid mass noted.  Patient Profile     60 y.o. male with history of gout, presenting with altered mental status being seen for bacteremia and endocarditis.  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  Omnious sign for possible atrial  septal/conduction system infection. -EF is preserved  2.  High degree AV block -Etiology possibly infection/endocarditis. -We will consult EP for additional input -Avoid AV nodal blocking agents.  Total encounter time 35 minutes  Greater than 50% was spent in counseling and coordination of care with the patient     Signed, Kate Sable, MD  07/07/2021, 2:19 PM

## 2021-07-07 NOTE — Progress Notes (Addendum)
Inpatient Diabetes Program Recommendations  AACE/ADA: New Consensus Statement on Inpatient Glycemic Control  Target Ranges:  Prepandial:   less than 140 mg/dL      Peak postprandial:   less than 180 mg/dL (1-2 hours)      Critically ill patients:  140 - 180 mg/dL  Results for ATA, PECHA (MRN [id number]) as of 07/07/2021 07:35  Ref. Range 07/07/2021 04:45  Glucose Latest Ref Range: 70 - 99 mg/dL 235 (H)   Results for ZION, TA (MRN [id number]) as of 07/07/2021 07:35  Ref. Range 07/06/2021 07:54 07/06/2021 11:03 07/06/2021 16:27 07/06/2021 21:10  Glucose-Capillary Latest Ref Range: 70 - 99 mg/dL 239 (H) 173 (H) 202 (H) 298 (H)  Results for CONALL, VANGORDER (MRN [id number]) as of 07/07/2021 12:44  Ref. Range 07/03/2021 09:59 07/03/2021 16:55  Glucose Latest Ref Range: 70 - 99 mg/dL 337 (H) 267 (H)  Hemoglobin A1C Latest Ref Range: 4.8 - 5.6 %  7.8 (H)   Review of Glycemic Control  Diabetes history: No Outpatient Diabetes medications: NA Current orders for Inpatient glycemic control: Semglee 23 units QHS, Novolog 0-20 units TID with meals, Novolog 0-5 units QHS, Novolog 6 units TID with meals   Inpatient Diabetes Program Recommendations:     Insulin: Patient received Semglee 15 units last night and will receive 23 units tonight. Patient is currently NPO for procedure today.    HbgA1C:  A1C 7.8% on 07/03/21 indicating an average glucose of 177 mg/dl over the past 2-3 months. Per progress note by Dr. Priscella Mann on 07/06/21, patient being newly dx with DM during this admission so will need to be educated on DM.  Addendum 07/07/21@12 :40-Spoke with patient about new DM dx. Patient sitting up in bed and drowsy at times during conversation. Patient states that he has not been told anything about DM yet or that he was being newly dx with DM. Patient denies any prior DM or preDM hx and reports no family hx of DM either. Patient confirms that he has not seen a healthcare provider or had  any blood work in a long time and no insurance. Explained to patient that his initial lab glucose was 337 mg/dl on 07/03/21 so other labs were ordered to help determine if glucose was elevated due to undx DM or from the steroids he was prescribed on 07/01/21. Explained what an A1C is, ADA criteria for dx of DM, and that his current A1C 7.8% on 07/03/21 indicating an average glucose of 177 mg/dl and meets criteria for dx of DM. Began discussing DM and transport came in to take patient downstairs for TEE. Informed patient that diabetes coordinator will follow up with him again tomorrow. Asked patient to start reading Living Well with DM book which is at bedside. Patient verbalized understanding and states he has no questions at this time.  Thanks, Joshua Alderman, RN, MSN, CDE Diabetes Coordinator Inpatient Diabetes Program [phone] (Team Pager from 8am to 5pm)

## 2021-07-08 ENCOUNTER — Encounter: Payer: Self-pay | Admitting: Cardiology

## 2021-07-08 ENCOUNTER — Inpatient Hospital Stay (HOSPITAL_COMMUNITY)
Admission: AD | Admit: 2021-07-08 | Discharge: 2021-07-20 | DRG: 070 | Disposition: A | Discharge status: Home Health | Payer: Self-pay | Source: Other Acute Inpatient Hospital | Attending: Internal Medicine | Admitting: Internal Medicine

## 2021-07-08 DIAGNOSIS — N2889 Other specified disorders of kidney and ureter: Secondary | ICD-10-CM

## 2021-07-08 DIAGNOSIS — R0602 Shortness of breath: Secondary | ICD-10-CM

## 2021-07-08 DIAGNOSIS — G9341 Metabolic encephalopathy: Principal | ICD-10-CM | POA: Diagnosis present

## 2021-07-08 DIAGNOSIS — M25559 Pain in unspecified hip: Secondary | ICD-10-CM

## 2021-07-08 DIAGNOSIS — K648 Other hemorrhoids: Secondary | ICD-10-CM | POA: Diagnosis present

## 2021-07-08 DIAGNOSIS — K269 Duodenal ulcer, unspecified as acute or chronic, without hemorrhage or perforation: Secondary | ICD-10-CM

## 2021-07-08 DIAGNOSIS — B9561 Methicillin susceptible Staphylococcus aureus infection as the cause of diseases classified elsewhere: Secondary | ICD-10-CM | POA: Diagnosis present

## 2021-07-08 DIAGNOSIS — K2970 Gastritis, unspecified, without bleeding: Secondary | ICD-10-CM | POA: Diagnosis present

## 2021-07-08 DIAGNOSIS — M25519 Pain in unspecified shoulder: Secondary | ICD-10-CM

## 2021-07-08 DIAGNOSIS — Z96643 Presence of artificial hip joint, bilateral: Secondary | ICD-10-CM | POA: Diagnosis present

## 2021-07-08 DIAGNOSIS — I442 Atrioventricular block, complete: Secondary | ICD-10-CM

## 2021-07-08 DIAGNOSIS — I459 Conduction disorder, unspecified: Secondary | ICD-10-CM | POA: Diagnosis present

## 2021-07-08 DIAGNOSIS — K264 Chronic or unspecified duodenal ulcer with hemorrhage: Secondary | ICD-10-CM | POA: Diagnosis not present

## 2021-07-08 DIAGNOSIS — E119 Type 2 diabetes mellitus without complications: Secondary | ICD-10-CM | POA: Diagnosis present

## 2021-07-08 DIAGNOSIS — M25552 Pain in left hip: Secondary | ICD-10-CM | POA: Diagnosis present

## 2021-07-08 DIAGNOSIS — M609 Myositis, unspecified: Secondary | ICD-10-CM | POA: Diagnosis present

## 2021-07-08 DIAGNOSIS — M25551 Pain in right hip: Secondary | ICD-10-CM | POA: Diagnosis present

## 2021-07-08 DIAGNOSIS — R7881 Bacteremia: Secondary | ICD-10-CM | POA: Diagnosis present

## 2021-07-08 DIAGNOSIS — J9691 Respiratory failure, unspecified with hypoxia: Secondary | ICD-10-CM | POA: Diagnosis present

## 2021-07-08 DIAGNOSIS — Z860100 Personal history of colon polyps, unspecified: Secondary | ICD-10-CM

## 2021-07-08 DIAGNOSIS — I76 Septic arterial embolism: Secondary | ICD-10-CM | POA: Diagnosis present

## 2021-07-08 DIAGNOSIS — E669 Obesity, unspecified: Secondary | ICD-10-CM | POA: Diagnosis present

## 2021-07-08 DIAGNOSIS — E871 Hypo-osmolality and hyponatremia: Secondary | ICD-10-CM | POA: Diagnosis present

## 2021-07-08 DIAGNOSIS — F1010 Alcohol abuse, uncomplicated: Secondary | ICD-10-CM | POA: Diagnosis present

## 2021-07-08 DIAGNOSIS — K635 Polyp of colon: Secondary | ICD-10-CM | POA: Diagnosis present

## 2021-07-08 DIAGNOSIS — D6959 Other secondary thrombocytopenia: Secondary | ICD-10-CM | POA: Diagnosis present

## 2021-07-08 DIAGNOSIS — I951 Orthostatic hypotension: Secondary | ICD-10-CM | POA: Diagnosis not present

## 2021-07-08 DIAGNOSIS — Z6833 Body mass index (BMI) 33.0-33.9, adult: Secondary | ICD-10-CM

## 2021-07-08 DIAGNOSIS — M109 Gout, unspecified: Secondary | ICD-10-CM | POA: Diagnosis present

## 2021-07-08 DIAGNOSIS — E0800 Diabetes mellitus due to underlying condition with hyperosmolarity without nonketotic hyperglycemic-hyperosmolar coma (NKHHC): Secondary | ICD-10-CM

## 2021-07-08 DIAGNOSIS — R7989 Other specified abnormal findings of blood chemistry: Secondary | ICD-10-CM

## 2021-07-08 DIAGNOSIS — I330 Acute and subacute infective endocarditis: Secondary | ICD-10-CM | POA: Diagnosis present

## 2021-07-08 DIAGNOSIS — I079 Rheumatic tricuspid valve disease, unspecified: Secondary | ICD-10-CM | POA: Diagnosis present

## 2021-07-08 DIAGNOSIS — D62 Acute posthemorrhagic anemia: Secondary | ICD-10-CM

## 2021-07-08 DIAGNOSIS — R531 Weakness: Secondary | ICD-10-CM

## 2021-07-08 DIAGNOSIS — M25512 Pain in left shoulder: Secondary | ICD-10-CM | POA: Diagnosis present

## 2021-07-08 DIAGNOSIS — M7989 Other specified soft tissue disorders: Secondary | ICD-10-CM | POA: Diagnosis present

## 2021-07-08 DIAGNOSIS — K2211 Ulcer of esophagus with bleeding: Secondary | ICD-10-CM | POA: Diagnosis not present

## 2021-07-08 DIAGNOSIS — E8809 Other disorders of plasma-protein metabolism, not elsewhere classified: Secondary | ICD-10-CM | POA: Diagnosis present

## 2021-07-08 LAB — C-REACTIVE PROTEIN: CRP: 24 mg/dL — ABNORMAL HIGH (ref <1.0)

## 2021-07-08 LAB — CBC WITH DIFFERENTIAL/PLATELET
Abs Immature Granulocytes: 0.22 10*3/uL — ABNORMAL HIGH (ref 0.00–0.07)
Basophils Absolute: 0 10*3/uL (ref 0.0–0.1)
Basophils Relative: 0 %
Eosinophils Absolute: 0 10*3/uL (ref 0.0–0.5)
Eosinophils Relative: 0 %
HCT: 30.2 % — ABNORMAL LOW (ref 39.0–52.0)
Hemoglobin: 10.7 g/dL — ABNORMAL LOW (ref 13.0–17.0)
Immature Granulocytes: 2 %
Lymphocytes Relative: 8 %
Lymphs Abs: 0.8 10*3/uL (ref 0.7–4.0)
MCH: 33 pg (ref 26.0–34.0)
MCHC: 35.4 g/dL (ref 30.0–36.0)
MCV: 93.2 fL (ref 80.0–100.0)
Monocytes Absolute: 0.8 10*3/uL (ref 0.1–1.0)
Monocytes Relative: 8 %
Neutro Abs: 8.3 10*3/uL — ABNORMAL HIGH (ref 1.7–7.7)
Neutrophils Relative %: 82 %
Platelets: 84 10*3/uL — ABNORMAL LOW (ref 150–400)
RBC: 3.24 MIL/uL — ABNORMAL LOW (ref 4.22–5.81)
RDW: 12.8 % (ref 11.5–15.5)
WBC: 10.2 10*3/uL (ref 4.0–10.5)
nRBC: 0 % (ref 0.0–0.2)

## 2021-07-08 LAB — CBC
HCT: 31.7 % — ABNORMAL LOW (ref 39.0–52.0)
Hemoglobin: 10.7 g/dL — ABNORMAL LOW (ref 13.0–17.0)
MCH: 32.2 pg (ref 26.0–34.0)
MCHC: 33.8 g/dL (ref 30.0–36.0)
MCV: 95.5 fL (ref 80.0–100.0)
Platelets: 80 10*3/uL — ABNORMAL LOW (ref 150–400)
RBC: 3.32 MIL/uL — ABNORMAL LOW (ref 4.22–5.81)
RDW: 12.8 % (ref 11.5–15.5)
WBC: 9 10*3/uL (ref 4.0–10.5)
nRBC: 0 % (ref 0.0–0.2)

## 2021-07-08 LAB — BASIC METABOLIC PANEL
Anion gap: 6 (ref 5–15)
BUN: 21 mg/dL — ABNORMAL HIGH (ref 6–20)
CO2: 27 mmol/L (ref 22–32)
Calcium: 7.2 mg/dL — ABNORMAL LOW (ref 8.9–10.3)
Chloride: 91 mmol/L — ABNORMAL LOW (ref 98–111)
Creatinine, Ser: 0.89 mg/dL (ref 0.61–1.24)
GFR, Estimated: >60 mL/min (ref >60)
Glucose, Bld: 194 mg/dL — ABNORMAL HIGH (ref 70–99)
Potassium: 3.7 mmol/L (ref 3.5–5.1)
Sodium: 124 mmol/L — ABNORMAL LOW (ref 135–145)

## 2021-07-08 LAB — THYROID PANEL WITH TSH
Free Thyroxine Index: 1.9 (ref 1.2–4.9)
T3 Uptake Ratio: 37 % (ref 24–39)
T4, Total: 5 ug/dL (ref 4.5–12.0)
TSH: 0.162 u[IU]/mL — ABNORMAL LOW (ref 0.450–4.500)

## 2021-07-08 LAB — GLUCOSE, CAPILLARY
Glucose-Capillary: 258 mg/dL — ABNORMAL HIGH (ref 70–99)
Glucose-Capillary: 287 mg/dL — ABNORMAL HIGH (ref 70–99)
Glucose-Capillary: 167 mg/dL — ABNORMAL HIGH (ref 70–99)
Glucose-Capillary: 167 mg/dL — ABNORMAL HIGH (ref 70–99)

## 2021-07-08 LAB — URINE DRUG SCREEN, QUALITATIVE (ARMC ONLY)
Amphetamines, Ur Screen: NOT DETECTED
Barbiturates, Ur Screen: NOT DETECTED
Benzodiazepine, Ur Scrn: POSITIVE — AB
Cannabinoid 50 Ng, Ur ~~LOC~~: NOT DETECTED
Cocaine Metabolite,Ur ~~LOC~~: NOT DETECTED
MDMA (Ecstasy)Ur Screen: NOT DETECTED
Methadone Scn, Ur: NOT DETECTED
Opiate, Ur Screen: NOT DETECTED
Phencyclidine (PCP) Ur S: NOT DETECTED
Tricyclic, Ur Screen: NOT DETECTED

## 2021-07-08 LAB — SEDIMENTATION RATE: Sed Rate: 82 mm/hr — ABNORMAL HIGH (ref 0–20)

## 2021-07-08 LAB — HEPATITIS C ANTIBODY: HCV Ab: NONREACTIVE

## 2021-07-08 LAB — CREATININE, SERUM
Creatinine, Ser: 0.96 mg/dL (ref 0.61–1.24)
GFR, Estimated: >60 mL/min (ref >60)

## 2021-07-08 LAB — KAPPA/LAMBDA LIGHT CHAINS
Kappa free light chain: 134.7 mg/L — ABNORMAL HIGH (ref 3.3–19.4)
Kappa, lambda light chain ratio: 1.33 (ref 0.26–1.65)
Lambda free light chains: 100.9 mg/L — ABNORMAL HIGH (ref 5.7–26.3)

## 2021-07-08 MED ORDER — FUROSEMIDE 20 MG PO TABS
20.0000 mg | ORAL_TABLET | Freq: Every day | ORAL | Status: DC
Start: 2021-07-09 — End: 2021-07-20

## 2021-07-08 MED ORDER — THIAMINE HCL 100 MG PO TABS
100.0000 mg | ORAL_TABLET | Freq: Every day | ORAL | Status: DC
Start: 2021-07-09 — End: 2021-08-25

## 2021-07-08 MED ORDER — INSULIN ASPART 100 UNIT/ML IJ SOLN: 0.0000 [IU] | Freq: Every day | INTRAMUSCULAR | Status: DC

## 2021-07-08 MED ORDER — CEFAZOLIN SODIUM-DEXTROSE 2-4 GM/100ML-% IV SOLN
2.0000 g | Freq: Three times a day (TID) | INTRAVENOUS | Status: DC
  Administered 2021-07-08 – 2021-07-20 (×36): 2 g via INTRAVENOUS
  Filled 2021-07-08 (×38): qty 100

## 2021-07-08 MED ORDER — ADULT MULTIVITAMIN W/MINERALS CH: 1.0000 | ORAL_TABLET | Freq: Every day | ORAL | Status: DC

## 2021-07-08 MED ORDER — INSULIN ASPART 100 UNIT/ML IJ SOLN: 0.0000 [IU] | Freq: Every day | INTRAMUSCULAR | 11 refills | Status: DC

## 2021-07-08 MED ORDER — INSULIN ASPART 100 UNIT/ML IJ SOLN: 0.0000 [IU] | Freq: Three times a day (TID) | INTRAMUSCULAR | 11 refills | Status: DC

## 2021-07-08 MED ORDER — PROSIGHT PO TABS
1.0000 | ORAL_TABLET | Freq: Every day | ORAL | Status: DC
  Administered 2021-07-09 – 2021-07-20 (×12): 1 via ORAL
  Filled 2021-07-08 (×12): qty 1

## 2021-07-08 MED ORDER — INSULIN ASPART 100 UNIT/ML IJ SOLN: 6.0000 [IU] | Freq: Three times a day (TID) | INTRAMUSCULAR | 11 refills | Status: DC

## 2021-07-08 MED ORDER — OXYCODONE HCL 5 MG PO TABS
5.0000 mg | ORAL_TABLET | ORAL | Status: DC | PRN
Start: 2021-07-08 — End: 2021-07-20
  Administered 2021-07-10 – 2021-07-19 (×10): 5 mg via ORAL
  Filled 2021-07-08 (×11): qty 1

## 2021-07-08 MED ORDER — ASPIRIN EC 81 MG PO TBEC
81.0000 mg | DELAYED_RELEASE_TABLET | Freq: Every day | ORAL | Status: DC
  Administered 2021-07-09 – 2021-07-14 (×6): 81 mg via ORAL
  Filled 2021-07-08 (×6): qty 1

## 2021-07-08 MED ORDER — LORAZEPAM 2 MG/ML IJ SOLN: 1.0000 mg | INTRAMUSCULAR | Status: DC | PRN

## 2021-07-08 MED ORDER — INSULIN ASPART 100 UNIT/ML IJ SOLN: 0.0000 [IU] | Freq: Three times a day (TID) | INTRAMUSCULAR | Status: DC

## 2021-07-08 MED ORDER — SODIUM CHLORIDE 0.9 % IV SOLN
INTRAVENOUS | Status: DC
Start: 2021-07-08 — End: 2021-07-12

## 2021-07-08 MED ORDER — INSULIN GLARGINE-YFGN 100 UNIT/ML ~~LOC~~ SOLN: 23.0000 [IU] | Freq: Every day | SUBCUTANEOUS | 11 refills | Status: DC

## 2021-07-08 MED ORDER — MORPHINE SULFATE (PF) 2 MG/ML IV SOLN: 2.0000 mg | INTRAVENOUS | 0 refills | Status: DC | PRN

## 2021-07-08 MED ORDER — CEFAZOLIN SODIUM-DEXTROSE 2-4 GM/100ML-% IV SOLN: 2.0000 g | Freq: Three times a day (TID) | INTRAVENOUS | Status: DC

## 2021-07-08 MED ORDER — THIAMINE HCL 100 MG PO TABS
100.0000 mg | ORAL_TABLET | Freq: Every day | ORAL | Status: DC
  Administered 2021-07-09 – 2021-07-20 (×12): 100 mg via ORAL
  Filled 2021-07-08 (×12): qty 1

## 2021-07-08 MED ORDER — MORPHINE SULFATE (PF) 2 MG/ML IV SOLN
2.0000 mg | INTRAVENOUS | Status: DC | PRN
  Administered 2021-07-13: 2 mg via INTRAVENOUS
  Filled 2021-07-08: qty 1

## 2021-07-08 MED ORDER — FUROSEMIDE 20 MG PO TABS
20.0000 mg | ORAL_TABLET | Freq: Every day | ORAL | Status: DC
  Administered 2021-07-09: 20 mg via ORAL
  Filled 2021-07-08: qty 1

## 2021-07-08 MED ORDER — ACETAMINOPHEN 325 MG PO TABS
650.0000 mg | ORAL_TABLET | ORAL | Status: DC | PRN
  Administered 2021-07-10 – 2021-07-19 (×5): 650 mg via ORAL
  Filled 2021-07-08 (×7): qty 2

## 2021-07-08 MED ORDER — LORAZEPAM 2 MG/ML IJ SOLN: 1.0000 mg | INTRAMUSCULAR | 0 refills | Status: DC | PRN

## 2021-07-08 MED ORDER — FUROSEMIDE 20 MG PO TABS
20.0000 mg | ORAL_TABLET | Freq: Every day | ORAL | Status: DC
  Administered 2021-07-08: 20 mg via ORAL
  Filled 2021-07-08: qty 1

## 2021-07-08 MED ORDER — PANTOPRAZOLE SODIUM 40 MG PO TBEC: 40.0000 mg | DELAYED_RELEASE_TABLET | Freq: Every day | ORAL | Status: DC

## 2021-07-08 MED ORDER — THIAMINE HCL 100 MG PO TABS
100.0000 mg | ORAL_TABLET | Freq: Every day | ORAL | Status: DC
  Administered 2021-07-08: 100 mg via ORAL
  Filled 2021-07-08: qty 1

## 2021-07-08 MED ORDER — ACETAMINOPHEN 325 MG PO TABS: 650.0000 mg | ORAL_TABLET | Freq: Four times a day (QID) | ORAL | Status: DC | PRN

## 2021-07-08 MED ORDER — FOLIC ACID 1 MG PO TABS
1.0000 mg | ORAL_TABLET | Freq: Every day | ORAL | Status: DC
  Administered 2021-07-09 – 2021-07-20 (×12): 1 mg via ORAL
  Filled 2021-07-08 (×12): qty 1

## 2021-07-08 MED ORDER — INSULIN GLARGINE-YFGN 100 UNIT/ML ~~LOC~~ SOLN
23.0000 [IU] | Freq: Every day | SUBCUTANEOUS | Status: DC
Start: 2021-07-08 — End: 2021-07-14
  Administered 2021-07-08 – 2021-07-13 (×6): 23 [IU] via SUBCUTANEOUS
  Filled 2021-07-08 (×7): qty 0.23

## 2021-07-08 MED ORDER — FOLIC ACID 1 MG PO TABS
1.0000 mg | ORAL_TABLET | Freq: Every day | ORAL | Status: DC
  Administered 2021-07-08: 1 mg via ORAL
  Filled 2021-07-08: qty 1

## 2021-07-08 MED ORDER — PANTOPRAZOLE SODIUM 40 MG PO TBEC
40.0000 mg | DELAYED_RELEASE_TABLET | Freq: Every day | ORAL | Status: DC
  Administered 2021-07-09 – 2021-07-14 (×6): 40 mg via ORAL
  Filled 2021-07-08 (×6): qty 1

## 2021-07-08 MED ORDER — NITROGLYCERIN 0.4 MG SL SUBL: 0.4000 mg | SUBLINGUAL_TABLET | SUBLINGUAL | Status: DC | PRN

## 2021-07-08 MED ORDER — HEPARIN SODIUM (PORCINE) 5000 UNIT/ML IJ SOLN
5000.0000 [IU] | Freq: Three times a day (TID) | INTRAMUSCULAR | Status: DC
  Administered 2021-07-08 – 2021-07-15 (×20): 5000 [IU] via SUBCUTANEOUS
  Filled 2021-07-08 (×20): qty 1

## 2021-07-08 MED ORDER — ONDANSETRON HCL 4 MG/2ML IJ SOLN
4.0000 mg | Freq: Four times a day (QID) | INTRAMUSCULAR | Status: DC | PRN
  Administered 2021-07-13: 4 mg via INTRAVENOUS
  Filled 2021-07-08: qty 2

## 2021-07-08 MED ORDER — ENSURE MAX PROTEIN PO LIQD
11.0000 [oz_av] | Freq: Two times a day (BID) | ORAL | Status: DC
  Filled 2021-07-08: qty 330

## 2021-07-08 MED ORDER — FOLIC ACID 1 MG PO TABS: 1.0000 mg | ORAL_TABLET | Freq: Every day | ORAL | Status: DC

## 2021-07-08 NOTE — Progress Notes (Signed)
Pt arrived to 4E from [facility]. Telemetry applied and CCMD notified. Call light within reach. VSS.  Raelyn Number, RN

## 2021-07-08 NOTE — Progress Notes (Signed)
Progress Note  Patient Name: Joshua Cole Date of Encounter: 07/08/2021  Primary Cardiologist: New to [facility] -consult by Fletcher Anon  Subjective   TEE yesterday concerning for tricuspid valve vegetation/infective endocarditis as outlined below. Case has been complicated by complete heart block. Evaluated by EP yesterday with recommendation for PPM at time of valve replacement and no indication for temp wire given stable escape rhythm. T max 100.7 earlier this morning. Vitals stable. No chest pain or dyspnea. Remains on supplemental oxygen via nasal cannula.   Inpatient Medications    Scheduled Meds:  insulin aspart  0-20 Units Subcutaneous TID WC   insulin aspart  0-5 Units Subcutaneous QHS   insulin aspart  6 Units Subcutaneous TID WC   insulin glargine-yfgn  23 Units Subcutaneous QHS   multivitamin with minerals  1 tablet Oral Daily   pantoprazole  40 mg Oral Daily   Continuous Infusions:  sodium chloride 20 mL/hr at 07/06/21 2331   sodium chloride 20 mL/hr at 07/08/21 0448    ceFAZolin (ANCEF) IV 2 g (07/08/21 0530)   PRN Meds: acetaminophen, gadobutrol, morphine injection, ondansetron (ZOFRAN) IV, oxyCODONE   Vital Signs    Vitals:   07/07/21 1943 07/08/21 0422 07/08/21 0500 07/08/21 0825  BP: (!) 140/58 (!) 144/66  (!) 127/54  Pulse: (!) 57 74  67  Resp: 20 20  20   Temp: 98.9 F (37.2 C) (!) 100.7 F (38.2 C) 98.6 F (37 C) 98.3 F (36.8 C)  TempSrc: Oral Oral Oral   SpO2: 95% 99%  96%  Weight:      Height:        Intake/Output Summary (Last 24 hours) at 07/08/2021 0859 Last data filed at 07/07/2021 2334 Gross per 24 hour  Intake 358 ml  Output 900 ml  Net -542 ml   Filed Weights   07/03/21 0954 07/03/21 1145  Weight: 105.2 kg 113.1 kg    Telemetry    High grade AV block with good escape rhythm - Personally Reviewed  ECG    No new tracings - Personally Reviewed  Physical Exam   GEN: No acute distress.   Neck: No JVD. Cardiac: RRR, no  murmurs, rubs, or gallops.  Respiratory: Clear to auscultation bilaterally.  GI: Soft, nontender, non-distended.   MS: No edema; No deformity. Neuro:  Alert and oriented x 3; Nonfocal.  Psych: Normal affect.  Labs    Chemistry Recent Labs  Lab 07/06/21 0814 07/07/21 0445 07/07/21 1213 07/08/21 0628  NA 125* 124*  --  124*  K 3.4* 3.8  --  3.7  CL 91* 90*  --  91*  CO2 25 26  --  27  GLUCOSE 230* 235*  --  194*  BUN 36* 27*  --  21*  CREATININE 1.04 0.91  --  0.89  CALCIUM 7.5* 7.6*  --  7.2*  PROT  --   --  6.8  --   ALBUMIN  --   --  1.9*  --   AST  --   --  27  --   ALT  --   --  16  --   ALKPHOS  --   --  64  --   BILITOT  --   --  1.4*  --   GFRNONAA >60 >60  --  >60  ANIONGAP 9 8  --  6     Hematology Recent Labs  Lab 07/06/21 0814 07/07/21 0445 07/08/21 0628  WBC 13.8* 14.4* 10.2  RBC  3.77* 3.51* 3.24*  HGB 12.6* 11.9* 10.7*  HCT 35.2* 32.4* 30.2*  MCV 93.4 92.3 93.2  MCH 33.4 33.9 33.0  MCHC 35.8 36.7* 35.4  RDW 12.8 12.5 12.8  PLT 57* 78* 84*    Cardiac EnzymesNo results for input(s): TROPONINI in the last 168 hours. No results for input(s): TROPIPOC in the last 168 hours.   BNP Recent Labs  Lab 07/05/21 0747  BNP 219.6*     DDimer  Recent Labs  Lab 07/04/21 0506  DDIMER 3.78*     Radiology    CXR 07/03/2021: IMPRESSION: No acute cardiopulmonary abnormality. _________  MRI brain 07/03/2021: IMPRESSION: No acute intracranial process. __________  CXR 07/05/2021: IMPRESSION: Nodular opacities in the lungs are better seen on recent chest CT. Bibasilar atelectasis. __________  CTA chest 07/05/2021: IMPRESSION: No definite evidence of pulmonary embolus is noted.   Multiple ill-defined nodular opacities are noted throughout both lungs, the largest measuring 13 mm in the right upper lobe. This is concerning for diffuse metastatic disease or possibly multifocal pneumonia. Clinical correlation and short-term follow-up CT scan  may be performed.   Mild bibasilar subsegmental atelectasis is noted.   Aortic Atherosclerosis (ICD10-I70.0). 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 Moderate right and trace left joint effusions, with right-sided periprostatic fluid collection tracking posteriorly along the greater trochanter. No marrow signal changes to suggest osteomyelitis. If there is concern for infected hip joint, aspiration should be considered.   Symmetric bilateral intramuscular edema involving the gluteal musculature and adductor muscles, consistent with non-specific myositis. No evidence of pyomyositis.   Cardiac Studies   TEE 07/07/2021: 1. Left ventricular ejection fraction, by estimation, is 55 to 60%. The  left ventricle has normal function.   2. Right ventricular systolic function is normal. The right ventricular  size is normal.   3. No left atrial/left atrial appendage thrombus was detected.   4. The mitral valve is normal in structure. Mild mitral valve  regurgitation.   5. There is a mobile mass attached to the tricuspid valve (clip 132)  consistent with a vegetation given the current clinical context.. The  tricuspid valve is degenerative.   6. The aortic valve is tricuspid. Aortic valve regurgitation is not  visualized.   Conclusion(s)/Recommendation(s): Findings are concerning for  vegetation/infective endocarditis as detailed above.  __________  2D echo 07/04/2021:  1. Left ventricular ejection fraction, by estimation, is 55 to 60%. The  left ventricle has normal function. Left ventricular endocardial border  not optimally defined to evaluate regional wall motion. There is mild left  ventricular hypertrophy. Left  ventricular diastolic parameters are indeterminate.   2. Right ventricular systolic function is normal. The right ventricular  size is normal. Tricuspid regurgitation signal is inadequate for  assessing  PA pressure.   3. Left atrial size was mildly dilated.   4. Right atrial size was mildly dilated.   5. The mitral valve is normal in structure. No evidence of mitral valve  regurgitation. No evidence of mitral stenosis.   6. The aortic valve is normal in structure. Aortic valve regurgitation is  not visualized. Mild aortic valve sclerosis is present, with no evidence  of aortic valve stenosis.   7. Aortic dilatation noted. There is borderline dilatation of the aortic  root and of the ascending aorta, measuring 38 mm.   8. The tricuspid was not well visualized. However, there is likely a  mobile vergetation noted on the short axis images. Recommend a TEE.  Patient Profile     60 y.o. male with history of gout, alcohol use, and poor dental hygiene who was admitted with MSSA bacteremia and hyponatremia and found to have a tricuspid valve vegetation complicated by complete heart block.   Assessment & Plan    1. MSSA bacteremia with tricuspid valve vegetation: -T max 100.7 this morning -IV Ancef per ID -Needs transfer to [facility] for cardiothoracic surgery evaluation of valve replacement, will work on coordinating this today  2. Complete heart block: -Likely in the setting of the above -Evaluated by EP with no current indication for venous temp wire at this time, given good escape rhythm, particularly in the context of ongoing bacteremia  -At the time of valve replacement he will require a pacemaker; EP has  recommended cardiothoracic surgery place an epicardial RA and RV leads at the time of valve replacement  -If he decompensates, would need temp wire at that time -Avoid AV nodal blocking agents   3. Respiratory failure: -Stable, remains on supplemental oxygen via nasal cannula -CTA chest negative for PE with concern for multiple ill-defined lesions  4. Hyponatremia: -Stable -Suspected to be in the setting of alcohol use  5. Thrombocytopenia: -Stable -Suspected to  be in the setting of alcohol use  6. Alcohol use: -Cessation advised   For questions or updates, please contact [place] Please consult [email or web address] for contact info under Cardiology/STEMI.    Signed, Christell Faith, PA-C [place] Pager: [phone] 07/08/2021, 8:59 AM

## 2021-07-08 NOTE — Progress Notes (Addendum)
Inpatient Diabetes Program Recommendations  AACE/ADA: New Consensus Statement on Inpatient Glycemic Control (2015)  Target Ranges:  Prepandial:   less than 140 mg/dL      Peak postprandial:   less than 180 mg/dL (1-2 hours)      Critically ill patients:  140 - 180 mg/dL  Results for [place], Nobel (MRN [id number]) as of 07/08/2021 07:41  Ref. Range 07/07/2021 07:59 07/07/2021 11:25 07/07/2021 12:50 07/07/2021 17:00 07/07/2021 21:22  Glucose-Capillary Latest Ref Range: 70 - 99 mg/dL 235 (H)  7 units Novolog @0915 193 (H) 199 (H) 186 (H)  4 units Novolog @1732 and   6 units Novolog @1825 300 (H)  3 units Novolog  23 units Semglee  Results for Spickard, Thoma (MRN [id number]) as of 07/08/2021 09:51  Ref. Range 07/08/2021 09:20  Glucose-Capillary Latest Ref Range: 70 - 99 mg/dL 258 (H)  Results for Westall, Dhaval (MRN [id number]) as of 07/08/2021 07:41  Ref. Range 07/03/2021 16:55  Hemoglobin A1C Latest Ref Range: 4.8 - 5.6 % 7.8 (H)  (177 mg/dl)   Admit with:  MSSA bacteremia Sepsis  Acute hypoxic respiratory failure New diagnosis diabetes mellitus with hyperglycemia   Current Orders: Semglee 23 units QHS    Novolog 0-20 units TID AC + HS    Novolog 6 units TID with meals    HbgA1C:  A1C 7.8% on 07/03/21 indicating an average glucose of 177 mg/dl over the past 2-3 months. Per progress note by Dr. Sreenath on 07/06/21, patient being newly dx with DM during this admission so will need to be educated on DM.  Has developed Tricuspid valve vegetation and complete heart block   MD- Note CBG 258 this AM.  Please consider:  1. Increase Semglee to 28 units QHS  2. Increase Novolog Meal Coverage to 8 units TID with meals  Addendum 10:30am--Met w/ pt this AM at bedside to further discuss new diabetes diagnosis.  While I was in the room, Dr. Amery came to see pt and pt seemed upset about his condition and stated he didn't know why the antibiotics aren't working.  Dr.  Amery explained that pt has infection in his blood stream and that has created a pocket of infection on his heart valve and pt at risk for septic emboli--Dr. Amery went on to explain to pt that he will need long-term IV Antibiotics and that the medical team is currently trying to figure out the best course of action for pt's condition--Dr. Amery also explained to pt that he will likely be transferred to [facility] to get evaluation by the TCTS team.  Pt remained frustrated and said he still didn't understand--Dr. Amery tried to provide further input but pt refused any further explanations.  Discussed with pt that his blood sugar issues/ new diagnosis of diabetes are important, however, his infection and heart issues take precedence at this point.  We reviewed his current A1c of 7.8% (explained what an A1c is and how we use it for both diagnosis and to determine effects of treatment).  Discussed with pt that medication, nutrition, and exercise are the 3 components of effective diabetes management.  Also discussed with pt that we currently have him on insulin in the hospital (Semglee and Novolog)--explained why we are using insulin, what the insulins are and how they work, and that his infection is likely driving up his CBGs and making insulin the best treatment at this point.  Given his A1c is only 7.8%, we may be able to convert to oral   meds when he goes home.  Reviewed CBG goals for home, basic nutrition, importance of CBG checks.  Pt currently uninsured and has no income.  Stated he was afraid he will lose his house--Has no money at this time.  Have placed TOC consult to help address these issues as pt will need follow up care and help with meds at time of d/c.     --Will follow patient during hospitalization--  Wyn Quaker RN, MSN, CDE Diabetes Coordinator Inpatient Glycemic Control Team Team Pager: [phone] (8a-5p)

## 2021-07-08 NOTE — Progress Notes (Signed)
Nutrition Education Note   RD consulted for nutrition education regarding diabetes.   Lab Results  Component Value Date   HGBA1C 8.0 (H) 07/04/2021    RD provided "Nutrition and Type II Diabetes" handout from the Academy of Nutrition and Dietetics. Discussed different food groups and their effects on blood sugar, emphasizing carbohydrate-containing foods. Provided list of carbohydrates and recommended serving sizes of common foods.  Discussed importance of controlled and consistent carbohydrate intake throughout the day. Provided examples of ways to balance meals/snacks and encouraged intake of high-fiber, whole grain complex carbohydrates. Teach back method used.  Expect fair compliance.  Body mass index is 35.77 kg/m. Pt meets criteria for obesity based on current BMI.  RD following this patient   Koleen Distance MS, RD, LDN Please refer to [facility] for RD and/or RD on-call/weekend/after hours pager

## 2021-07-08 NOTE — TOC Initial Note (Signed)
Transition of Care [facility]) - Initial/Assessment Note    Patient Details  Name: Joshua Cole MRN: [id number] Date of Birth: 22-Jun-1961  [facility] CM/SW Contact:    Beverly Sessions, RN Phone Number: 07/08/2021, 1:33 PM  Clinical Narrative:                  Patient admitted with hyponatremia Darden Dates shows vegetation Pending transfer to [facility] for further treatment  Patient states that he lives at home with Fiance No PCP  Patient states that he has a RW and shower seat in the home Currently requiring acute O2   Patient resides in [facility] Provided patient with application to Medication Management , [facility] , "[employer]:  Your Guide to Free and [employer] in [place]"  Booklet    Expected Discharge Plan: [place] Barriers to Discharge: Continued Medical Work up   Patient Goals and CMS Choice        Expected Discharge Plan and Services Expected Discharge Plan: [place]   Discharge Planning Services: CM Consult   Living arrangements for the past 2 months: Single Family Home                                      Prior Living Arrangements/Services Living arrangements for the past 2 months: Single Family Home Lives with:: Significant Other Patient language and need for interpreter reviewed:: Yes Do you feel safe going back to the place where you live?: Yes      Need for Family Participation in Patient Care: Yes (Comment) Care giver support system in place?: Yes (comment) Current home services: DME Criminal Activity/Legal Involvement Pertinent to Current Situation/Hospitalization: No - Comment as needed  Activities of Daily Living Home Assistive Devices/Equipment: Gilford Rile (specify type) ADL Screening (condition at time of admission) Patient's cognitive ability adequate to safely complete daily activities?: Yes Is the patient deaf or have difficulty hearing?: No Does the  patient have difficulty seeing, even when wearing glasses/contacts?: No Does the patient have difficulty concentrating, remembering, or making decisions?: Yes Patient able to express need for assistance with ADLs?: Yes Does the patient have difficulty dressing or bathing?: No Independently performs ADLs?: Yes (appropriate for developmental age) Does the patient have difficulty walking or climbing stairs?: Yes Weakness of Legs: Both Weakness of Arms/Hands: Both  Permission Sought/Granted                  Emotional Assessment Appearance:: Appears stated age Attitude/Demeanor/Rapport: Engaged Affect (typically observed): Accepting Orientation: : Oriented to Self, Oriented to Place, Oriented to  Time, Oriented to Situation   Psych Involvement: No (comment)  Admission diagnosis:  Hyponatremia [E87.1] Weakness [R53.1] Patient Active Problem List   Diagnosis Date Noted   Acute bacterial endocarditis    Heart block    Bacteremia    Hyponatremia 13/04/6577   Acute metabolic encephalopathy 46/96/2952   Joint pain 07/03/2021   Generalized weakness 07/03/2021   SIRS (systemic inflammatory response syndrome) ([place]) 07/03/2021   Hypokalemia 07/03/2021   Hyperglycemia 07/03/2021   Thrombocytopenia ([place]) 07/03/2021   Nausea vomiting and diarrhea 07/03/2021   Gout    PCP:  Pcp, No Pharmacy:  No Pharmacies Listed    Social Determinants of Health (SDOH) Interventions    Readmission Risk Interventions No flowsheet data found.

## 2021-07-08 NOTE — H&P (Signed)
Cardiology Admission History and Physical:   Patient ID: Joshua Cole MRN: [id number]; DOB: 09/06/1961   Admission date: 07/08/2021  PCP:  Pcp, No   [facility] Providers Cardiologist:  Kathlyn Sacramento, MD     Chief Complaint:  Fever, chills, confusion  Patient Profile:   Joshua Cole is a 60 y.o. male with gout, alcohol use, bilateral THA, and poor dental hygiene who was admitted to [facility] on 07/03/2021 with MSSA bacteremia and found to have a tricuspid valve vegetation complicated by complete heart block with good escape rhythm who is being seen 07/08/2021 for the evaluation of the above.  History of Present Illness:   Joshua Cole has no previously known cardiac history. Works as a Teacher, early years/pre. He presented to [facility] with AMS, generalized weakness, fever, chills, and arthralgias and was started on empiric IV antibiotics. He developed hypoxia with CTA chest being negative for PE, though incidental finding of likely bilateral septic emboli. Surface echo was suboptimal with normal LV systolic function. The tricuspid valve was not well visualized, however, some views showed a likely mobile vegetation. Source of his bacteremia remained unclear. TEE showed  LVEF of 55-60%, with a mobile mass attached to the tricuspid valve consistent with a vegetation given the current clinical context. He developed a fever of 100.7 this morning. His hospital course has been complicated by the development of high-grade AV block. He was evaluated by EP on 10/26, with noted good escape rhythm and no current indication for emergent venous temp wire. At the time of valve replacement, EP has recommended a permanent pacing system with recommendation for cardiothoracic surgery to place epicardial RA and RV leads at the time of valve. Urine drug screen notable for benzodiazepines only. On rounds on 10/27, he remains hemodynamically stable and is without chest pain, dyspnea, dizziness, presyncope, or syncope.  He remains in high-grade AV block with good escape rhythm.    Past Medical History:  Diagnosis Date   Arthritis    DJD (degenerative joint disease)    Gout     Past Surgical History:  Procedure Laterality Date   JOINT REPLACEMENT     TEE WITHOUT CARDIOVERSION N/A 07/07/2021   Procedure: TRANSESOPHAGEAL ECHOCARDIOGRAM (TEE);  Surgeon: Kate Sable, MD;  Location: [facility] ORS;  Service: Cardiovascular;  Laterality: N/A;     Medications Prior to Admission: Prior to Admission medications   Not on File     Allergies:   No Known Allergies  Social History:   Social History   Socioeconomic History   Marital status: Unknown    Spouse name: Not on file   Number of children: Not on file   Years of education: Not on file   Highest education level: Not on file  Occupational History   Not on file  Tobacco Use   Smoking status: Never   Smokeless tobacco: Never  Substance and Sexual Activity   Alcohol use: Yes    Comment: bilateral hip replacement   Drug use: Never   Sexual activity: Not on file  Other Topics Concern   Not on file  Social History Narrative   Not on file   Social Determinants of Health   Financial Resource Strain: Not on file  Food Insecurity: Not on file  Transportation Needs: Not on file  Physical Activity: Not on file  Stress: Not on file  Social Connections: Not on file  Intimate Partner Violence: Not on file    Family History:   The patient's family history includes Psoriasis in  his sister.    ROS:  Please see the history of present illness.  All other ROS reviewed and negative.     Physical Exam/Data:  There were no vitals filed for this visit. No intake or output data in the 24 hours ending 07/08/21 1049 Last 3 Weights 07/03/2021 07/03/2021  Weight (lbs) 249 lb 4.8 oz 232 lb  Weight (kg) 113.082 kg 105.235 kg     There is no height or weight on file to calculate BMI.  General:  Well nourished, well developed, in no acute  distress HEENT: Normal Neck: No JVD Vascular: No carotid bruits; Distal pulses 2+ bilaterally   Cardiac:  Normal S1, S2; RRR; no murmur  Lungs:  Clear to auscultation bilaterally, no wheezing, rhonchi or rales  Abd: Soft, nontender, no hepatomegaly  Ext: No edema Musculoskeletal:  No deformities, BUE and BLE strength normal and equal Skin: Warm and dry  Neuro:  CNs 2-12 intact, no focal abnormalities noted Psych:  Normal affect    EKG:  The ECG that was done 07/03/2021 was personally reviewed and demonstrates sinus tachycardia, 109 bpm, with nonspecific st/t changes  Relevant CV Studies:  TEE 07/07/2021: 1. Left ventricular ejection fraction, by estimation, is 55 to 60%. The  left ventricle has normal function.   2. Right ventricular systolic function is normal. The right ventricular  size is normal.   3. No left atrial/left atrial appendage thrombus was detected.   4. The mitral valve is normal in structure. Mild mitral valve  regurgitation.   5. There is a mobile mass attached to the tricuspid valve (clip 132)  consistent with a vegetation given the current clinical context.. The  tricuspid valve is degenerative.   6. The aortic valve is tricuspid. Aortic valve regurgitation is not  visualized.   Conclusion(s)/Recommendation(s): Findings are concerning for  vegetation/infective endocarditis as detailed above.  __________   2D echo 07/04/2021:  1. Left ventricular ejection fraction, by estimation, is 55 to 60%. The  left ventricle has normal function. Left ventricular endocardial border  not optimally defined to evaluate regional wall motion. There is mild left  ventricular hypertrophy. Left  ventricular diastolic parameters are indeterminate.   2. Right ventricular systolic function is normal. The right ventricular  size is normal. Tricuspid regurgitation signal is inadequate for assessing  PA pressure.   3. Left atrial size was mildly dilated.   4. Right atrial size  was mildly dilated.   5. The mitral valve is normal in structure. No evidence of mitral valve  regurgitation. No evidence of mitral stenosis.   6. The aortic valve is normal in structure. Aortic valve regurgitation is  not visualized. Mild aortic valve sclerosis is present, with no evidence  of aortic valve stenosis.   7. Aortic dilatation noted. There is borderline dilatation of the aortic  root and of the ascending aorta, measuring 38 mm.   8. The tricuspid was not well visualized. However, there is likely a  mobile vergetation noted on the short axis images. Recommend a TEE.  Laboratory Data:  High Sensitivity Troponin:  No results for input(s): TROPONINIHS in the last 720 hours.    Chemistry Recent Labs  Lab 07/03/21 1655 07/03/21 2301 07/07/21 0445 07/08/21 0628  NA 124*   < > 124* 124*  K 3.4*   < > 3.8 3.7  CL 85*   < > 90* 91*  CO2 27   < > 26 27  GLUCOSE 267*   < > 235* 194*  BUN 31*   < > 27* 21*  CREATININE 1.19   < > 0.91 0.89  CALCIUM 8.1*   < > 7.6* 7.2*  MG 2.2  --   --   --   GFRNONAA >60   < > >60 >60  ANIONGAP 12   < > 8 6   < > = values in this interval not displayed.    Recent Labs  Lab 07/07/21 1213  PROT 6.8  ALBUMIN 1.9*  AST 27  ALT 16  ALKPHOS 64  BILITOT 1.4*   Lipids  Recent Labs  Lab 07/08/21 0628  CHOL 96  TRIG 102  HDL <10*  LDLCALC PENDING  CHOLHDL PENDING   Hematology Recent Labs  Lab 07/07/21 0445 07/08/21 0628  WBC 14.4* 10.2  RBC 3.51* 3.24*  HGB 11.9* 10.7*  HCT 32.4* 30.2*  MCV 92.3 93.2  MCH 33.9 33.0  MCHC 36.7* 35.4  RDW 12.5 12.8  PLT 78* 84*   Thyroid  Recent Labs  Lab 07/07/21 1213  TSH 0.162*   BNP Recent Labs  Lab 07/05/21 0747  BNP 219.6*    DDimer  Recent Labs  Lab 07/04/21 0506  DDIMER 3.78*     Radiology/Studies:   CXR 07/03/2021: IMPRESSION: No acute cardiopulmonary abnormality. _________   MRI brain 07/03/2021: IMPRESSION: No acute intracranial process. __________    CXR 07/05/2021: IMPRESSION: Nodular opacities in the lungs are better seen on recent chest CT. Bibasilar atelectasis. __________   CTA chest 07/05/2021: IMPRESSION: No definite evidence of pulmonary embolus is noted.   Multiple ill-defined nodular opacities are noted throughout both lungs, the largest measuring 13 mm in the right upper lobe. This is concerning for diffuse metastatic disease or possibly multifocal pneumonia. Clinical correlation and short-term follow-up CT scan may be performed.   Mild bibasilar subsegmental atelectasis is noted.   Aortic Atherosclerosis (ICD10-I70.0). __________   MR pelvis 07/05/2021: IMPRESSION: Bilateral hip arthroplasties with associated susceptibility artifact. Moderate right and trace left joint effusions, with right-sided periprostatic fluid collection tracking posteriorly along the greater trochanter. No marrow signal changes to suggest osteomyelitis. If there is concern for infected hip joint, aspiration should be considered.   Symmetric bilateral intramuscular edema involving the gluteal musculature and adductor muscles, consistent with non-specific myositis. No evidence of pyomyositis.  Assessment and Plan:    1. MSSA bacteremia with tricuspid valve vegetation with acute metabolic encephalopathy: -T max 100.7 this morning -IV Ancef per ID -Mental status improved, suspected to be in the setting of his bacteremia  -Needs transfer to [facility] for cardiothoracic surgery evaluation of valve replacement vs Angiovac, will work on coordinating this today -CVTS is aware of the patient   2.  Complete heart block: -Likely in the setting of the above -Evaluated by EP with no current indication for venous temp wire at this time, given good escape rhythm, particularly in the context of ongoing bacteremia  -Transfer to [facility] for EP management of complete heart block -At the time of valve replacement/repair he will require a  pacemaker; EP has  recommended cardiothoracic surgery place epicardial RA and RV leads at the time of valve replacement  -If he decompensates, would need temp wire  -Avoid AV nodal blocking agents    3.  Respiratory failure: -Stable, remains on supplemental oxygen via nasal cannula -CTA chest negative for PE with concern for multiple ill-defined lesions   4.  Hyponatremia: -Stable -Evaluated by nephrology  -Suspected to be in the setting of alcohol use  5.  Thrombocytopenia: -Stable -Suspected to be in the setting of alcohol use   6.  Alcohol use: -Cessation advised   7. DM: -Newly diagnosed  -A1c 7.8 -Evaluated by diabetic coordinator at [facility] -Needs outpatient follow up with PCP  8. Arthralgias: -Evaluated by orthopedics -Minimal concern for periprosthetic hip infection without need for aspiration  -Continue previously ordered PRN oxycodone and morphine     Risk Assessment/Risk Scores:     Severity of Illness: The appropriate patient status for this patient is INPATIENT. Inpatient status is judged to be reasonable and necessary in order to provide the required intensity of service to ensure the patient's safety. The patient's presenting symptoms, physical exam findings, and initial radiographic and laboratory data in the context of their chronic comorbidities is felt to place them at high risk for further clinical deterioration. Furthermore, it is not anticipated that the patient will be medically stable for discharge from the hospital within 2 midnights of admission.   * I certify that at the point of admission it is my clinical judgment that the patient will require inpatient hospital care spanning beyond 2 midnights from the point of admission due to high intensity of service, high risk for further deterioration and high frequency of surveillance required.*   For questions or updates, please contact [place] Please consult [email or web address] for contact info  under     Signed, Christell Faith, PA-C  07/08/2021 10:49 AM

## 2021-07-08 NOTE — Progress Notes (Addendum)
[facility]  ROUNDING NOTE   Subjective:   Patient seen resting in bed, alert and oriented Mild hand tremors while trying to place drawl into completed States he is very fatigued this morning Denies pain and discomfort Denies shortness of breath Patient seen later in morning with tremors stating he could not control Complained of generalized malaise  Objective:  Vital signs in last 24 hours:  Temp:  [98.3 F (36.8 C)-100.7 F (38.2 C)] 98.3 F (36.8 C) (10/27 0825) Pulse Rate:  [57-78] 67 (10/27 0825) Resp:  [16-32] 20 (10/27 0825) BP: (108-144)/(44-67) 127/54 (10/27 0825) SpO2:  [90 %-99 %] 96 % (10/27 0825)  Weight change:  Filed Weights   07/03/21 0954 07/03/21 1145  Weight: 105.2 kg 113.1 kg    Intake/Output: I/O last 3 completed shifts: In: 235 [P.O.:358; I.V.:269; IV Piggyback:100] Out: 1450 [Urine:1450]   Intake/Output this shift:  No intake/output data recorded.  Physical Exam: General: NAD, laying in bed, tremors  Head: Normocephalic, atraumatic. Moist oral mucosal membranes  Eyes: Anicteric  Lungs:  Clear to auscultation, normal breathing effort  Heart: Regular rate and rhythm  Abdomen:  Soft, nontender, nondistended  Extremities: trace peripheral edema.  Neurologic: Nonfocal, moving all four extremities  Skin: No lesions       Basic Metabolic Panel: Recent Labs  Lab 07/03/21 1655 07/03/21 2301 07/04/21 0506 07/05/21 0747 07/06/21 0814 07/07/21 0445 07/08/21 0628  NA 124*   < > 128* 127* 125* 124* 124*  K 3.4*   < > 4.1 3.7 3.4* 3.8 3.7  CL 85*   < > 94* 92* 91* 90* 91*  CO2 27   < > 27 27 25 26 27   GLUCOSE 267*   < > 281* 282* 230* 235* 194*  BUN 31*   < > 29* 30* 36* 27* 21*  CREATININE 1.19   < > 0.91 0.87 1.04 0.91 0.89  CALCIUM 8.1*   < > 7.8* 7.8* 7.5* 7.6* 7.2*  MG 2.2  --   --   --   --   --   --   PHOS 3.4  --   --   --   --   --   --    < > = values in this interval not displayed.    Liver Function  Tests: Recent Labs  Lab 07/07/21 1213  AST 27  ALT 16  ALKPHOS 64  BILITOT 1.4*  PROT 6.8  ALBUMIN 1.9*   No results for input(s): LIPASE, AMYLASE in the last 168 hours. No results for input(s): AMMONIA in the last 168 hours.  CBC: Recent Labs  Lab 07/03/21 1655 07/04/21 0506 07/05/21 0747 07/06/21 0814 07/07/21 0445 07/08/21 0628  WBC 13.1* 16.2* 15.9* 13.8* 14.4* 10.2  NEUTROABS 11.5*  --  13.3* 11.5* 11.9* 8.3*  HGB 15.1 14.3 12.6* 12.6* 11.9* 10.7*  HCT 41.9 38.7* 34.6* 35.2* 32.4* 30.2*  MCV 91.1 94.2 94.0 93.4 92.3 93.2  PLT 67* 59* 71* 57* 78* 84*    Cardiac Enzymes: No results for input(s): CKTOTAL, CKMB, CKMBINDEX, TROPONINI in the last 168 hours.  BNP: Invalid input(s): POCBNP  CBG: Recent Labs  Lab 07/07/21 1125 07/07/21 1250 07/07/21 1700 07/07/21 2122 07/08/21 0920  GLUCAP 193* 199* 186* 300* 258*    Microbiology: Results for orders placed or performed during the hospital encounter of 07/03/21  Resp Panel by RT-PCR (Flu A&B, Covid) Nasopharyngeal Swab     Status: None   Collection Time: 07/03/21 12:08 PM  Specimen: Nasopharyngeal Swab; Nasopharyngeal(NP) swabs in vial transport medium  Result Value Ref Range Status   SARS Coronavirus 2 by RT PCR NEGATIVE NEGATIVE Final    Comment: (NOTE) SARS-CoV-2 target nucleic acids are NOT DETECTED.  The SARS-CoV-2 RNA is generally detectable in upper respiratory specimens during the acute phase of infection. The lowest concentration of SARS-CoV-2 viral copies this assay can detect is 138 copies/mL. A negative result does not preclude SARS-Cov-2 infection and should not be used as the sole basis for treatment or other patient management decisions. A negative result may occur with  improper specimen collection/handling, submission of specimen other than nasopharyngeal swab, presence of viral mutation(s) within the areas targeted by this assay, and inadequate number of viral copies(<138 copies/mL). A  negative result must be combined with clinical observations, patient history, and epidemiological information. The expected result is Negative.  Fact Sheet for Patients:  EntrepreneurPulse.com.au  Fact Sheet for Healthcare Providers:  IncredibleEmployment.be  This test is no t yet approved or cleared by the [place] FDA and  has been authorized for detection and/or diagnosis of SARS-CoV-2 by FDA under an Emergency Use Authorization (EUA). This EUA will remain  in effect (meaning this test can be used) for the duration of the COVID-19 declaration under Section 564(b)(1) of the Act, 21 U.S.C.section 360bbb-3(b)(1), unless the authorization is terminated  or revoked sooner.       Influenza A by PCR NEGATIVE NEGATIVE Final   Influenza B by PCR NEGATIVE NEGATIVE Final    Comment: (NOTE) The Xpert Xpress SARS-CoV-2/FLU/RSV plus assay is intended as an aid in the diagnosis of influenza from Nasopharyngeal swab specimens and should not be used as a sole basis for treatment. Nasal washings and aspirates are unacceptable for Xpert Xpress SARS-CoV-2/FLU/RSV testing.  Fact Sheet for Patients: EntrepreneurPulse.com.au  Fact Sheet for Healthcare Providers: IncredibleEmployment.be  This test is not yet approved or cleared by the [place] FDA and has been authorized for detection and/or diagnosis of SARS-CoV-2 by FDA under an Emergency Use Authorization (EUA). This EUA will remain in effect (meaning this test can be used) for the duration of the COVID-19 declaration under Section 564(b)(1) of the Act, 21 U.S.C. section 360bbb-3(b)(1), unless the authorization is terminated or revoked.  Performed at [facility], [address], [place], [place] [address]   Urine Culture     Status: Abnormal   Collection Time: 07/03/21  3:09 PM   Specimen: Urine, Random  Result Value Ref Range Status   Specimen  Description   Final    URINE, RANDOM Performed at [facility], [address] [place], [place] [address]    Special Requests   Final    NONE Performed at [facility], [address] [place], [place] [address]    Culture MULTIPLE SPECIES PRESENT, SUGGEST RECOLLECTION (A)  Final   Report Status 07/05/2021 FINAL  Final  Culture, blood (x 2)     Status: Abnormal   Collection Time: 07/03/21  4:55 PM   Specimen: BLOOD  Result Value Ref Range Status   Specimen Description   Final    BLOOD LEFT ANTECUBITAL Performed at [facility], [address] [place], [place] [address]    Special Requests   Final    BOTTLES DRAWN AEROBIC AND ANAEROBIC Blood Culture results may not be optimal due to an excessive volume of blood received in culture bottles Performed at [facility], [address] [place],  [address]    Culture  Setup Time   Final    IN BOTH AEROBIC AND ANAEROBIC BOTTLES GRAM POSITIVE COCCI CRITICAL RESULT CALLED TO, READ BACK BY AND VERIFIED WITH: NATHAN BELUE AT 7517 07/04/21.PMF Performed at [facility], [place] [address] [place] [address] [place], [place] [address]    Culture STAPHYLOCOCCUS AUREUS (A)  Final   Report Status 07/06/2021 FINAL  Final   Organism ID, Bacteria STAPHYLOCOCCUS AUREUS  Final      Susceptibility   Staphylococcus aureus - MIC*    CIPROFLOXACIN <=0.5 SENSITIVE Sensitive     ERYTHROMYCIN <=0.25 SENSITIVE Sensitive     GENTAMICIN <=0.5 SENSITIVE Sensitive     OXACILLIN <=0.25 SENSITIVE Sensitive     TETRACYCLINE <=1 SENSITIVE Sensitive     VANCOMYCIN <=0.5 SENSITIVE Sensitive     TRIMETH/SULFA <=10 SENSITIVE Sensitive     CLINDAMYCIN <=0.25 SENSITIVE Sensitive     RIFAMPIN <=0.5 SENSITIVE Sensitive     Inducible Clindamycin NEGATIVE Sensitive     * STAPHYLOCOCCUS AUREUS  Blood Culture ID Panel (Reflexed)     Status: Abnormal   Collection Time: 07/03/21  4:55 PM  Result Value Ref Range Status   Enterococcus  faecalis NOT DETECTED NOT DETECTED Final   Enterococcus Faecium NOT DETECTED NOT DETECTED Final   Listeria monocytogenes NOT DETECTED NOT DETECTED Final   Staphylococcus species DETECTED (A) NOT DETECTED Final    Comment: CRITICAL RESULT CALLED TO, READ BACK BY AND VERIFIED WITH: NATHAN BELUE AT 9449 07/04/21.PMF    Staphylococcus aureus (BCID) DETECTED (A) NOT DETECTED Final    Comment: CRITICAL RESULT CALLED TO, READ BACK BY AND VERIFIED WITH: NATHAN BELUE AT 6759 07/04/21.PMF    Staphylococcus epidermidis NOT DETECTED NOT DETECTED Final   Staphylococcus lugdunensis NOT DETECTED NOT DETECTED Final   Streptococcus species NOT DETECTED NOT DETECTED Final   Streptococcus agalactiae NOT DETECTED NOT DETECTED Final   Streptococcus pneumoniae NOT DETECTED NOT DETECTED Final   Streptococcus pyogenes NOT DETECTED NOT DETECTED Final   A.calcoaceticus-baumannii NOT DETECTED NOT DETECTED Final   Bacteroides fragilis NOT DETECTED NOT DETECTED Final   Enterobacterales NOT DETECTED NOT DETECTED Final   Enterobacter cloacae complex NOT DETECTED NOT DETECTED Final   Escherichia coli NOT DETECTED NOT DETECTED Final   Klebsiella aerogenes NOT DETECTED NOT DETECTED Final   Klebsiella oxytoca NOT DETECTED NOT DETECTED Final   Klebsiella pneumoniae NOT DETECTED NOT DETECTED Final   Proteus species NOT DETECTED NOT DETECTED Final   Salmonella species NOT DETECTED NOT DETECTED Final   Serratia marcescens NOT DETECTED NOT DETECTED Final   Haemophilus influenzae NOT DETECTED NOT DETECTED Final   Neisseria meningitidis NOT DETECTED NOT DETECTED Final   Pseudomonas aeruginosa NOT DETECTED NOT DETECTED Final   Stenotrophomonas maltophilia NOT DETECTED NOT DETECTED Final   Candida albicans NOT DETECTED NOT DETECTED Final   Candida auris NOT DETECTED NOT DETECTED Final   Candida glabrata NOT DETECTED NOT DETECTED Final   Candida krusei NOT DETECTED NOT DETECTED Final   Candida parapsilosis NOT DETECTED NOT  DETECTED Final   Candida tropicalis NOT DETECTED NOT DETECTED Final   Cryptococcus neoformans/gattii NOT DETECTED NOT DETECTED Final   Meth resistant mecA/C and MREJ NOT DETECTED NOT DETECTED Final    Comment: Performed at [facility], [address] [place], [place] [address]  Culture, blood (x 2)     Status: Abnormal   Collection Time: 07/03/21  6:37 PM   Specimen: BLOOD  Result Value Ref Range Status   Specimen Description   Final  BLOOD RIGHT HAND Performed at [facility], [address] [place], [place] [address]    Special Requests   Final    BOTTLES DRAWN AEROBIC AND ANAEROBIC Blood Culture adequate volume Performed at [facility], [address] [place], [place] [address]    Culture  Setup Time   Final    GRAM POSITIVE COCCI IN BOTH AEROBIC AND ANAEROBIC BOTTLES CRITICAL RESULT CALLED TO, READ BACK BY AND VERIFIED WITH: NATHAN BELUE AT 3151 07/04/21.PMF Performed at [facility], [address] [place], [place] [address]    Culture (A)  Final    STAPHYLOCOCCUS AUREUS SUSCEPTIBILITIES PERFORMED ON PREVIOUS CULTURE WITHIN THE LAST 5 DAYS. Performed at [facility], [place] [address], [place], [place] [address]    Report Status 07/06/2021 FINAL  Final  CULTURE, BLOOD (ROUTINE X 2) w Reflex to ID Panel     Status: None (Preliminary result)   Collection Time: 07/05/21  7:47 AM   Specimen: BLOOD  Result Value Ref Range Status   Specimen Description BLOOD RIGHT WRIST  Final   Special Requests   Final    BOTTLES DRAWN AEROBIC AND ANAEROBIC Blood Culture adequate volume   Culture   Final    NO GROWTH 3 DAYS Performed at [facility], [address], [place], [place] [address]    Report Status PENDING  Incomplete  CULTURE, BLOOD (ROUTINE X 2) w Reflex to ID Panel     Status: None (Preliminary result)   Collection Time: 07/05/21  7:47 AM   Specimen: BLOOD  Result Value Ref Range Status   Specimen Description BLOOD  BLOOD RIGHT HAND  Final   Special Requests   Final    BOTTLES DRAWN AEROBIC ONLY Blood Culture adequate volume   Culture   Final    NO GROWTH 3 DAYS Performed at [facility], [address], [place], [place] [address]    Report Status PENDING  Incomplete    Coagulation Studies: No results for input(s): LABPROT, INR in the last 72 hours.  Urinalysis: No results for input(s): COLORURINE, LABSPEC, PHURINE, GLUCOSEU, HGBUR, BILIRUBINUR, KETONESUR, PROTEINUR, UROBILINOGEN, NITRITE, LEUKOCYTESUR in the last 72 hours.  Invalid input(s): APPERANCEUR    Imaging: ECHO TEE  Result Date: 07/07/2021    TRANSESOPHOGEAL ECHO REPORT   Patient Name:   Joshua Cole Date of Exam: 07/07/2021 Medical Rec #:  [id number]        Height:       70.0 in Accession #:    [id number]       Weight:       249.3 lb Date of Birth:  February 23, 1961       BSA:          2.292 m Patient Age:    60 years         BP:           113/61 mmHg Patient Gender: M                HR:           71 bpm. Exam Location:  [facility] Procedure: Transesophageal Echo, Cardiac Doppler and Color Doppler Indications:     bacteremia  History:         Patient has prior history of Echocardiogram examinations, most                  recent 07/04/2021. Bacteremia.  Sonographer:     Sherrie Sport Referring Phys:  [phone] [place]  H FURTH Diagnosing Phys: Kate Sable MD PROCEDURE: The transesophogeal probe was passed without difficulty through the esophogus of the patient. Sedation performed by performing physician. The patient developed no complications during the procedure. IMPRESSIONS  1. Left ventricular ejection fraction, by estimation, is 55 to 60%. The left ventricle has normal function.  2. Right ventricular systolic function is normal. The right ventricular size is normal.  3. No left atrial/left atrial appendage thrombus was detected.  4. The mitral valve is normal in structure. Mild mitral valve regurgitation.  5. There is a mobile mass  attached to the tricuspid valve (clip 132) consistent with a vegetation given the current clinical context.. The tricuspid valve is degenerative.  6. The aortic valve is tricuspid. Aortic valve regurgitation is not visualized. 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 FINDINGS  Left Ventricle: Left ventricular ejection fraction, by estimation, is 55 to 60%. The left ventricle has normal function. The left ventricular internal cavity size was normal in size. Right Ventricle: The right ventricular size is normal. No increase in right ventricular wall thickness. Right ventricular systolic function is normal. Left Atrium: Left atrial size was normal in size. No left atrial/left atrial appendage thrombus was detected. Right Atrium: Right atrial size was normal in size. Pericardium: There is no evidence of pericardial effusion. Mitral Valve: The mitral valve is normal in structure. Mild mitral valve regurgitation. Tricuspid Valve: There is a mobile mass attached to the tricuspid valve (clip 132) consistent with a vegetation given the current clinical context. The tricuspid valve is degenerative in appearance. Tricuspid valve regurgitation is trivial. Aortic Valve: The aortic valve is tricuspid. Aortic valve regurgitation is not visualized. Pulmonic Valve: The pulmonic valve was normal in structure. Pulmonic valve regurgitation is not visualized. Aorta: The aortic root is normal in size and structure. IAS/Shunts: No atrial level shunt detected by color flow Doppler. Kate Sable MD Electronically signed by Kate Sable MD Signature Date/Time: 07/07/2021/5:55:00 PM    Final      Medications:    sodium chloride 20 mL/hr at 07/06/21 2331   sodium chloride 20 mL/hr at 07/08/21 0448    ceFAZolin (ANCEF) IV 2 g (73/41/93 7902)    folic acid  1 mg Oral Daily   furosemide  20 mg Oral Daily   insulin aspart  0-20 Units Subcutaneous TID WC    insulin aspart  0-5 Units Subcutaneous QHS   insulin aspart  6 Units Subcutaneous TID WC   insulin glargine-yfgn  23 Units Subcutaneous QHS   multivitamin with minerals  1 tablet Oral Daily   pantoprazole  40 mg Oral Daily   thiamine  100 mg Oral Daily   acetaminophen, gadobutrol, LORazepam, morphine injection, ondansetron (ZOFRAN) IV, oxyCODONE  Assessment/ Plan:  [name] is a 60 y.o.  male with alcohol abuse, osteoarthritis, who was admitted to [facility] on 07/03/2021 for Hyponatremia [E87.1] Weakness [R53.1]   Hyponatremia: with normal osmolarity on admission. History suggestive of alcohol abuse. Labs are suggestive of hepatic cirrhosis or cirrhosis.  -Sodium remains 124 - TSH low at 0.162 and lipid panel shows low HDL <10 with other elements pending.  SPEP/UPEP.  - 1276ml fluid restriction - Placed on low dose Furosemide 20mg  po dialy  2. 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  Currently prescribed Ancef and monitoring cultures.  Infectious disease now following plan for possible transfer to [facility] to be evaluated for cardiothoracic surgery.    LOS: 5 Kiyanna Biegler 10/27/202210:52  AM

## 2021-07-08 NOTE — Discharge Summary (Signed)
Juanpablo Ciresi JOA:[id number] DOB: [birth year] DOA: 07/03/2021  PCP: Pcp, No  Admit date: 07/03/2021 Discharge date: 07/08/2021  Admitted From: home Disposition:  [place] to cardiology service    Discharge Condition:Guarded CODE STATUS:full  Diet recommendation: Heart Healthy / Carb Modified  Brief/Interim Summary: Per HPI: 60 y.o. male with medical history significant of gout, alcohol use, who presents with generalized weakness, multiple joints pain, confusion, nausea, vomiting, diarrhea, fever.   Per his fiance (I called her fianc by phone), patient received flu shot and COVID-vaccine booster on Friday.  In Saturday night, patient had fever of 102 and chills.  He complains of multiple joint pain, including right hand, left shoulder, bilateral hip and lower back.  He has nausea, vomiting and few times of watery diarrhea, which has resolved per patient.  Patient has mild dry cough, mild shortness of breath, denies chest pain. Patient was found to be intermittently confused.  When saw patient in the ED, patient is mildly confused, but is still oriented x3.  He moves all extremities.  No facial droop or slurred speech.  Denies symptoms of UTI.  Her fianc states that patient was seen in urgent care and was tested negative for flu and COVID, but started him on Tamiflu and steroid on 10/20. He was admitted to the hospital and found with MSSA positive blood cultures. ID was consulted.  Mental status improving.  Desaturation event on 10/24.  Follow-up CT angiography negative for PE.  Is concerning for multiple ill-defined lesions; suspecting septic emboli (see report).  Patient had TEE on was found with tricuspid valve vegetation.  On 10/26 he was also found with complete heart block.  He is being transferred to [facility] for higher level of care and possible valve replacement by CT chest.   MSSA bacteremia Sepsis secondary to above Sepsis criteria met with leukocytosis and  tachycardia Source of bacteremia is unclear Patient was started on Ancef Sepsis physiology improving TTE with tricuspid valve vegetation Patient was complaining of pain in bilateral hips.  MRI demonstrates nonspecific myositis with bilateral joint effusions right greater than left 10/27 TEE with TV vegetation.  ID was consulted Crp elevated. 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  EP was consulted, input was appreciated.  Recommended CT surgery place epicardial RA and RV leads at the time of valve replacement         Acute metabolic encephalopathy Etiology not entirely clear TTP ruled out after oncology reviewed peripheral smear MRI reassuring Low suspicion for intracranial thrombosis related to COVID-vaccine Mental status seems to be improving Suspect related to bacteremia  continue neurochecks     H/xo heavy drinking on weekends Will place on CIWA protocal       Acute hypoxic respiratory failure Patient had desaturation event on 10/24 Oxygen saturation on room air mid 80s CT angiography negative for PE Concerning for multiple ill-defined lesions, possible septic emboli Fluid overload on differential ivf was d/'cd. Will continue to monitor     H/xo heavy drinking on weekends placed on CIWA protocal     New diagnosis diabetes mellitus with hyperglycemia Hemoglobin A1c 7.8 Patient does not see physicians and is not on any diabetic medication Started on insulin  Carb modified diet Diabetes coordinator consulted    Diffuse joint pain Generalized weakness Possible exacerbation of gout, felt unlikely Would be atypical  presentation Has been on steroids since 10/20, then was stopped Toradol was d/c'd in setting of thrombocytopenia Uric acid normal Orthopedic consult, consider arthrocentesis   Thrombocytopenia Platelets  and 60s, no active bleeding Suspect secondary to alcohol intake and infection TTP unlikely due to lack of schistocytes on peripheral smear Oncology followed Platelets improving   Hyponatremia Likely secondary to alcohol abuse and poor oral intake Na level continued to drop, nephrology was consulted. TSH low. SPEP/UPEP pending Was placed on 1200 ml fluid restriction Placed on low dose lasix 68m po daily     Hypokalemia Monitor and replace potassium as necessary   Hyperglycemia Likely related to recent steroid use.steroid d/c'd   Intractable nausea vomiting Diarrhea Resolved     Discharge Diagnoses:  Principal Problem:   Hyponatremia Active Problems:   Acute metabolic encephalopathy   Joint pain   Generalized weakness   SIRS (systemic inflammatory response syndrome) (HCC)   Hypokalemia   Hyperglycemia   Thrombocytopenia (HCC)   Nausea vomiting and diarrhea   Gout   Bacteremia   Acute bacterial endocarditis   Heart block    Discharge Instructions  Discharge Instructions     Amb Referral to Nutrition and Diabetic Education   Complete by: As directed    Diet - low sodium heart healthy   Complete by: As directed    Increase activity slowly   Complete by: As directed       Allergies as of 07/08/2021   No Known Allergies      Medication List     TAKE these medications    acetaminophen 325 MG tablet Commonly known as: TYLENOL Take 2 tablets (650 mg total) by mouth every 6 (six) hours as needed for mild pain or fever.   ceFAZolin 2-4 GM/100ML-% IVPB Commonly known as: ANCEF Inject 100 mLs (2 g total) into the vein every 8 (eight) hours.   folic acid 1 MG tablet Commonly known as: FOLVITE Take 1 tablet (1 mg total) by mouth daily. Start taking on: July 09, 2021   furosemide 20 MG tablet Commonly known as: LASIX Take 1 tablet (20 mg total) by mouth daily. Start taking on: July 09, 2021   insulin aspart 100 UNIT/ML injection Commonly  known as: novoLOG Inject 0-5 Units into the skin at bedtime.   insulin aspart 100 UNIT/ML injection Commonly known as: novoLOG Inject 0-20 Units into the skin 3 (three) times daily with meals.   insulin aspart 100 UNIT/ML injection Commonly known as: novoLOG Inject 6 Units into the skin 3 (three) times daily with meals.   insulin glargine-yfgn 100 UNIT/ML injection Commonly known as: SEMGLEE Inject 0.23 mLs (23 Units total) into the skin at bedtime.   LORazepam 2 MG/ML injection Commonly known as: ATIVAN Inject 0.5-1 mLs (1-2 mg total) into the vein every hour as needed (seizures or agitation).   morphine 2 MG/ML injection Inject 1 mL (2 mg total) into the vein every 3 (three) hours as needed.   multivitamin with minerals Tabs tablet Take 1 tablet by mouth daily. Start taking on: July 09, 2021   pantoprazole 40 MG tablet Commonly known as: PROTONIX Take 1 tablet (40 mg total) by mouth daily. Start taking on: July 09, 2021   thiamine 100 MG tablet Take 1 tablet (100 mg total) by mouth daily. Start taking on: July 09, 2021        No Known Allergies  Consultations: EP, cardiology, ID, nephrology   Procedures/Studies: CT Angio Chest Pulmonary Embolism (PE) W or  WO Contrast  Result Date: 07/05/2021 CLINICAL DATA:  Positive D-dimer level. EXAM: CT ANGIOGRAPHY CHEST WITH CONTRAST TECHNIQUE: Multidetector CT imaging of the chest was performed using the standard protocol during bolus administration of intravenous contrast. Multiplanar CT image reconstructions and MIPs were obtained to evaluate the vascular anatomy. CONTRAST:  13m OMNIPAQUE IOHEXOL 350 MG/ML SOLN COMPARISON:  None. FINDINGS: Cardiovascular: Satisfactory opacification of the pulmonary arteries to the segmental level. No evidence of pulmonary embolism. Normal heart size. No pericardial effusion. Atherosclerosis of thoracic aorta is noted without aneurysm formation. Mediastinum/Nodes: No enlarged  mediastinal, hilar, or axillary lymph nodes. Thyroid gland, trachea, and esophagus demonstrate no significant findings. Lungs/Pleura: No pneumothorax is noted. Mild bibasilar subsegmental atelectasis is noted. Multiple ill-defined nodular opacities are noted throughout both lungs with the largest measuring 13 mm in the right upper lobe. This is concerning for metastatic disease or possibly multifocal infection. Upper Abdomen: No acute abnormality. Musculoskeletal: No chest wall abnormality. No acute or significant osseous findings. Review of the MIP images confirms the above findings. IMPRESSION: No definite evidence of pulmonary embolus is noted. Multiple ill-defined nodular opacities are noted throughout both lungs, the largest measuring 13 mm in the right upper lobe. This is concerning for diffuse metastatic disease or possibly multifocal pneumonia. Clinical correlation and short-term follow-up CT scan may be performed. Mild bibasilar subsegmental atelectasis is noted. Aortic Atherosclerosis (ICD10-I70.0). Electronically Signed   By: JMarijo ConceptionM.D.   On: 07/05/2021 11:44   MR BRAIN W WO CONTRAST  Result Date: 07/03/2021 CLINICAL DATA:  Mental status change, unknown cause EXAM: MRI HEAD WITHOUT AND WITH CONTRAST TECHNIQUE: Multiplanar, multiecho pulse sequences of the brain and surrounding structures were obtained without and with intravenous contrast. CONTRAST:  193mGADAVIST GADOBUTROL 1 MMOL/ML IV SOLN COMPARISON:  None. FINDINGS: Brain: No acute infarction, hemorrhage, hydrocephalus, extra-axial collection, or mass lesion. The ventricles and sulci are within normal limits for age. No abnormal enhancement. No foci of hemosiderin deposition to suggest remote hemorrhage. Vascular: Normal flow voids. Skull and upper cervical spine: Normal marrow signal. Sinuses/Orbits: Small mucous retention cyst in the left maxillary sinus. Otherwise negative. Other: Trace fluid in right mastoid air cells. IMPRESSION:  No acute intracranial process. Electronically Signed   By: AlMerilyn Baba.D.   On: 07/03/2021 22:45   MR PELVIS WO CONTRAST  Result Date: 07/05/2021 CLINICAL DATA:  Hip replacement, infection suspected EXAM: MRI PELVIS WITHOUT CONTRAST TECHNIQUE: Multiplanar multisequence MR imaging of the pelvis was performed. No intravenous contrast was administered. COMPARISON:  None. FINDINGS: Urinary Tract:  No abnormality visualized. Bowel:  Unremarkable visualized pelvic bowel loops. Vascular/Lymphatic: No pathologically enlarged lymph nodes. No significant vascular abnormality seen. Reproductive:  No mass or other significant abnormality Other:  None. Musculoskeletal: There are bilateral hip arthroplasties with associated susceptibility artifact. There are moderate right and trace left joint effusions. There is no visible marrow signal abnormality. Lower lumbar spine degenerative disc disease, partially visualized. There is intramuscular edema bilaterally involving the gluteus musculature and abductor muscles. 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 Moderate right and trace left joint effusions, with right-sided periprostatic fluid collection tracking posteriorly along the greater trochanter. No marrow signal changes to suggest osteomyelitis. If there is concern for infected hip joint, aspiration should be considered. Symmetric bilateral intramuscular edema involving the gluteal musculature and adductor muscles, consistent with non-specific myositis. No evidence of pyomyositis. Electronically Signed   By: JaMaurine Simmering.D.   On: 07/05/2021 16:00   [facility]  Result Date: 07/05/2021 CLINICAL DATA:  Weakness and shortness of breath EXAM: PORTABLE CHEST 1 VIEW COMPARISON:  Radiograph 07/03/2021 FINDINGS: Unchanged cardiomediastinal silhouette. Nodular opacities in the lungs are better seen on recent chest CT. Bibasilar atelectasis. No large effusion. No  pneumothorax. No acute osseous abnormality. IMPRESSION: Nodular opacities in the lungs are better seen on recent chest CT. Bibasilar atelectasis. Electronically Signed   By: Maurine Simmering M.D.   On: 07/05/2021 13:45   DG Chest Portable 1 View  Result Date: 07/03/2021 CLINICAL DATA:  sob/cough EXAM: PORTABLE CHEST 1 VIEW COMPARISON:  None. FINDINGS: The cardiomediastinal silhouette is mildly enlarged in contour. No pleural effusion. No pneumothorax. No acute pleuroparenchymal abnormality. Visualized abdomen is unremarkable. IMPRESSION: No acute cardiopulmonary abnormality. Electronically Signed   By: Valentino Saxon M.D.   On: 07/03/2021 12:28   ECHOCARDIOGRAM COMPLETE  Result Date: 07/05/2021    ECHOCARDIOGRAM REPORT   Patient Name:   WILHO SHARPLEY Date of Exam: 07/04/2021 Medical Rec #:  [id number]        Height:       70.0 in Accession #:    [id number]       Weight:       249.3 lb Date of Birth:  02-11-1961       BSA:          2.292 m Patient Age:    69 years         BP:           107/62 mmHg Patient Gender: M                HR:           70 bpm. Exam Location:  [facility] Procedure: 2D Echo, Cardiac Doppler and Color Doppler Indications:     Bacteremia R78.81  History:         Patient has no prior history of Echocardiogram examinations. No                  heart history listed in medical file.  Sonographer:     Sherrie Sport Referring Phys:  [id number] [place] Diagnosing Phys: Kathlyn Sacramento MD  Sonographer Comments: Technically challenging study due to limited acoustic windows, suboptimal apical window and suboptimal parasternal window. IMPRESSIONS  1. Left ventricular ejection fraction, by estimation, is 55 to 60%. The left ventricle has normal function. Left ventricular endocardial border not optimally defined to evaluate regional wall motion. There is mild left ventricular hypertrophy. Left ventricular diastolic parameters are indeterminate.  2. Right ventricular systolic function is normal. The  right ventricular size is normal. Tricuspid regurgitation signal is inadequate for assessing PA pressure.  3. Left atrial size was mildly dilated.  4. Right atrial size was mildly dilated.  5. The mitral valve is normal in structure. No evidence of mitral valve regurgitation. No evidence of mitral stenosis.  6. The aortic valve is normal in structure. Aortic valve regurgitation is not visualized. Mild aortic valve sclerosis is present, with no evidence of aortic valve stenosis.  7. Aortic dilatation noted. There is borderline dilatation of the aortic root and of the ascending aorta, measuring 38 mm.  8. The tricuspid was not well visualized. However, there is likely a mobile vergetation noted on the short axis images. Recommend a TEE. FINDINGS  Left Ventricle: Left ventricular ejection fraction, by estimation, is 55 to 60%. The left ventricle has normal function. Left ventricular endocardial border not optimally defined to evaluate regional wall motion. The left ventricular  internal cavity size was normal in size. There is mild left ventricular hypertrophy. Left ventricular diastolic parameters are indeterminate. Right Ventricle: The right ventricular size is normal. No increase in right ventricular wall thickness. Right ventricular systolic function is normal. Tricuspid regurgitation signal is inadequate for assessing PA pressure. Left Atrium: Left atrial size was mildly dilated. Right Atrium: Right atrial size was mildly dilated. Pericardium: There is no evidence of pericardial effusion. Mitral Valve: The mitral valve is normal in structure. There is mild calcification of the mitral valve leaflet(s). No evidence of mitral valve regurgitation. No evidence of mitral valve stenosis. Tricuspid Valve: The tricuspid valve is not well visualized. Tricuspid valve regurgitation is not demonstrated. No evidence of tricuspid stenosis. Aortic Valve: The aortic valve is normal in structure. Aortic valve regurgitation is not  visualized. Mild aortic valve sclerosis is present, with no evidence of aortic valve stenosis. Aortic valve mean gradient measures 4.0 mmHg. Aortic valve peak gradient measures 7.7 mmHg. Aortic valve area, by VTI measures 3.66 cm. Pulmonic Valve: The pulmonic valve was normal in structure. Pulmonic valve regurgitation is not visualized. No evidence of pulmonic stenosis. Aorta: Aortic dilatation noted. There is borderline dilatation of the aortic root and of the ascending aorta, measuring 38 mm. Venous: The inferior vena cava was not well visualized. IAS/Shunts: No atrial level shunt detected by color flow Doppler.  LEFT VENTRICLE PLAX 2D LVIDd:         3.70 cm   Diastology LVIDs:         2.70 cm   LV e' medial:    5.55 cm/s LV PW:         1.30 cm   LV E/e' medial:  14.4 LV IVS:        0.95 cm   LV e' lateral:   12.50 cm/s LVOT diam:     2.20 cm   LV E/e' lateral: 6.4 LV SV:         67 LV SV Index:   29 LVOT Area:     3.80 cm  RIGHT VENTRICLE RV Basal diam:  3.70 cm LEFT ATRIUM           Index        RIGHT ATRIUM           Index LA diam:      4.30 cm 1.88 cm/m   RA Area:     21.80 cm LA Vol (A4C): 69.6 ml 30.36 ml/m  RA Volume:   77.30 ml  33.72 ml/m  AORTIC VALVE                    PULMONIC VALVE AV Area (Vmax):    2.64 cm     PV Vmax:        0.81 m/s AV Area (Vmean):   3.00 cm     PV Peak grad:   2.7 mmHg AV Area (VTI):     3.66 cm     RVOT Peak grad: 4 mmHg AV Vmax:           139.00 cm/s AV Vmean:          81.200 cm/s AV VTI:            0.182 m AV Peak Grad:      7.7 mmHg AV Mean Grad:      4.0 mmHg LVOT Vmax:         96.60 cm/s LVOT Vmean:        64.000 cm/s LVOT  VTI:          0.175 m LVOT/AV VTI ratio: 0.96  AORTA Ao Root diam: 3.73 cm MITRAL VALVE               TRICUSPID VALVE MV Area (PHT): 2.30 cm    TR Peak grad:   9.4 mmHg MV Decel Time: 330 msec    TR Vmax:        153.00 cm/s MV E velocity: 79.70 cm/s MV A velocity: 90.00 cm/s  SHUNTS MV E/A ratio:  0.89        Systemic VTI:  0.18 m                             Systemic Diam: 2.20 cm Kathlyn Sacramento MD Electronically signed by Kathlyn Sacramento MD Signature Date/Time: 07/05/2021/12:50:50 PM    Final    ECHO TEE  Result Date: 07/07/2021    TRANSESOPHOGEAL ECHO REPORT   Patient Name:   MEILECH VIRTS Date of Exam: 07/07/2021 Medical Rec #:  [id number]        Height:       70.0 in Accession #:    [id number]       Weight:       249.3 lb Date of Birth:  01-22-1961       BSA:          2.292 m Patient Age:    11 years         BP:           113/61 mmHg Patient Gender: M                HR:           71 bpm. Exam Location:  [facility] Procedure: Transesophageal Echo, Cardiac Doppler and Color Doppler Indications:     bacteremia  History:         Patient has prior history of Echocardiogram examinations, most                  recent 07/04/2021. Bacteremia.  Sonographer:     Sherrie Sport Referring Phys:  [id number] [place] Diagnosing Phys: Kate Sable MD PROCEDURE: The transesophogeal probe was passed without difficulty through the esophogus of the patient. Sedation performed by performing physician. The patient developed no complications during the procedure. IMPRESSIONS  1. Left ventricular ejection fraction, by estimation, is 55 to 60%. The left ventricle has normal function.  2. Right ventricular systolic function is normal. The right ventricular size is normal.  3. No left atrial/left atrial appendage thrombus was detected.  4. The mitral valve is normal in structure. Mild mitral valve regurgitation.  5. There is a mobile mass attached to the tricuspid valve (clip 132) consistent with a vegetation given the current clinical context.. The tricuspid valve is degenerative.  6. The aortic valve is tricuspid. Aortic valve regurgitation is not visualized. 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 FINDINGS  Left Ventricle: Left ventricular ejection fraction, by estimation, is 55 to 60%. The left  ventricle has normal function. The left ventricular internal cavity size was normal in size. Right Ventricle: The right ventricular size is normal. No increase in right ventricular wall thickness. Right ventricular systolic function is normal. Left Atrium: Left atrial size was normal in size. No left atrial/left atrial appendage thrombus was detected. Right Atrium: Right atrial size was normal in size. Pericardium: There is no evidence of pericardial effusion.  Mitral Valve: The mitral valve is normal in structure. Mild mitral valve regurgitation. Tricuspid Valve: There is a mobile mass attached to the tricuspid valve (clip 132) consistent with a vegetation given the current clinical context. The tricuspid valve is degenerative in appearance. Tricuspid valve regurgitation is trivial. Aortic Valve: The aortic valve is tricuspid. Aortic valve regurgitation is not visualized. Pulmonic Valve: The pulmonic valve was normal in structure. Pulmonic valve regurgitation is not visualized. Aorta: The aortic root is normal in size and structure. IAS/Shunts: No atrial level shunt detected by color flow Doppler. Kate Sable MD Electronically signed by Kate Sable MD Signature Date/Time: 07/07/2021/5:55:00 PM    Final       Subjective: No sob, cp, dizziness  Discharge Exam: Vitals:   07/08/21 0500 07/08/21 0825  BP:  (!) 127/54  Pulse:  67  Resp:  20  Temp: 98.6 F (37 C) 98.3 F (36.8 C)  SpO2:  96%   Vitals:   07/07/21 1943 07/08/21 0422 07/08/21 0500 07/08/21 0825  BP: (!) 140/58 (!) 144/66  (!) 127/54  Pulse: (!) 57 74  67  Resp: _0 Temp: 98.9 F (37.2 C) (!) 100.7 F (38.2 C) 98.6 F (37 C) 98.3 F (36.8 C)  TempSrc: Oral Oral Oral   SpO2: 95% 99%  96%  Weight:      Height:        General: Pt is alert, awake, not in acute distress Cardiovascular: RRR, S1/S2 +, no rubs, no gallops Respiratory: CTA bilaterally, no wheezing, no rhonchi Abdominal: Soft, NT, ND, bowel sounds  + Extremities: no edema    The results of significant diagnostics from this hospitalization (including imaging, microbiology, ancillary and laboratory) are listed below for reference.     Microbiology: Recent Results (from the past 240 hour(s))  Resp Panel by RT-PCR (Flu A&B, Covid) Nasopharyngeal Swab     Status: None   Collection Time: 07/03/21 12:08 PM   Specimen: Nasopharyngeal Swab; Nasopharyngeal(NP) swabs in vial transport medium  Result Value Ref Range Status   SARS Coronavirus 2 by RT PCR NEGATIVE NEGATIVE Final    Comment: (NOTE) SARS-CoV-2 target nucleic acids are NOT DETECTED.  The SARS-CoV-2 RNA is generally detectable in upper respiratory specimens during the acute phase of infection. The lowest concentration of SARS-CoV-2 viral copies this assay can detect is 138 copies/mL. A negative result does not preclude SARS-Cov-2 infection and should not be used as the sole basis for treatment or other patient management decisions. A negative result may occur with  improper specimen collection/handling, submission of specimen other than nasopharyngeal swab, presence of viral mutation(s) within the areas targeted by this assay, and inadequate number of viral copies(<138 copies/mL). A negative result must be combined with clinical observations, patient history, and epidemiological information. The expected result is Negative.  Fact Sheet for Patients:  EntrepreneurPulse.com.au  Fact Sheet for Healthcare Providers:  IncredibleEmployment.be  This test is no t yet approved or cleared by the [place] FDA and  has been authorized for detection and/or diagnosis of SARS-CoV-2 by FDA under an Emergency Use Authorization (EUA). This EUA will remain  in effect (meaning this test can be used) for the duration of the COVID-19 declaration under Section 564(b)(1) of the Act, 21 U.S.C.section 360bbb-3(b)(1), unless the authorization is terminated   or revoked sooner.       Influenza A by PCR NEGATIVE NEGATIVE Final   Influenza B by PCR NEGATIVE NEGATIVE Final    Comment: (NOTE) The Xpert  Xpress SARS-CoV-2/FLU/RSV plus assay is intended as an aid in the diagnosis of influenza from Nasopharyngeal swab specimens and should not be used as a sole basis for treatment. Nasal washings and aspirates are unacceptable for Xpert Xpress SARS-CoV-2/FLU/RSV testing.  Fact Sheet for Patients: EntrepreneurPulse.com.au  Fact Sheet for Healthcare Providers: IncredibleEmployment.be  This test is not yet approved or cleared by the [place] FDA and has been authorized for detection and/or diagnosis of SARS-CoV-2 by FDA under an Emergency Use Authorization (EUA). This EUA will remain in effect (meaning this test can be used) for the duration of the COVID-19 declaration under Section 564(b)(1) of the Act, 21 U.S.C. section 360bbb-3(b)(1), unless the authorization is terminated or revoked.  Performed at [facility] [facility], [address] [place], [place] [address]   Urine Culture     Status: Abnormal   Collection Time: 07/03/21  3:09 PM   Specimen: Urine, Random  Result Value Ref Range Status   Specimen Description   Final    URINE, RANDOM Performed at [facility], [address] [place], [place] [address]    Special Requests   Final    NONE Performed at [facility], [address] [place], [place] [address]    Culture MULTIPLE SPECIES PRESENT, SUGGEST RECOLLECTION (A)  Final   Report Status 07/05/2021 FINAL  Final  Culture, blood (x 2)     Status: Abnormal   Collection Time: 07/03/21  4:55 PM   Specimen: BLOOD  Result Value Ref Range Status   Specimen Description   Final    BLOOD LEFT ANTECUBITAL Performed at [facility], [address] [place], [place] [address]    Special Requests   Final    BOTTLES DRAWN AEROBIC AND ANAEROBIC Blood Culture results  may not be optimal due to an excessive volume of blood received in culture bottles Performed at [facility], [address] [place], [place] [address]    Culture  Setup Time   Final    IN BOTH AEROBIC AND ANAEROBIC BOTTLES GRAM POSITIVE COCCI CRITICAL RESULT CALLED TO, READ BACK BY AND VERIFIED WITH: NATHAN BELUE AT 1751 07/04/21.PMF Performed at [facility], [place] [address] [place], [place] [address]    Culture STAPHYLOCOCCUS AUREUS (A)  Final   Report Status 07/06/2021 FINAL  Final   Organism ID, Bacteria STAPHYLOCOCCUS AUREUS  Final      Susceptibility   Staphylococcus aureus - MIC*    CIPROFLOXACIN <=0.5 SENSITIVE Sensitive     ERYTHROMYCIN <=0.25 SENSITIVE Sensitive     GENTAMICIN <=0.5 SENSITIVE Sensitive     OXACILLIN <=0.25 SENSITIVE Sensitive     TETRACYCLINE <=1 SENSITIVE Sensitive     VANCOMYCIN <=0.5 SENSITIVE Sensitive     TRIMETH/SULFA <=10 SENSITIVE Sensitive     CLINDAMYCIN <=0.25 SENSITIVE Sensitive     RIFAMPIN <=0.5 SENSITIVE Sensitive     Inducible Clindamycin NEGATIVE Sensitive     * STAPHYLOCOCCUS AUREUS  Blood Culture ID Panel (Reflexed)     Status: Abnormal   Collection Time: 07/03/21  4:55 PM  Result Value Ref Range Status   Enterococcus faecalis NOT DETECTED NOT DETECTED Final   Enterococcus Faecium NOT DETECTED NOT DETECTED Final   Listeria monocytogenes NOT DETECTED NOT DETECTED Final   Staphylococcus species DETECTED (A) NOT DETECTED Final    Comment: CRITICAL RESULT CALLED TO, READ BACK BY AND VERIFIED WITH: NATHAN BELUE AT 2778 07/04/21.PMF    Staphylococcus aureus (BCID) DETECTED (A) NOT DETECTED Final  Comment: CRITICAL RESULT CALLED TO, READ BACK BY AND VERIFIED WITH: NATHAN BELUE AT 7741 07/04/21.PMF    Staphylococcus epidermidis NOT DETECTED NOT DETECTED Final   Staphylococcus lugdunensis NOT DETECTED NOT DETECTED Final   Streptococcus species NOT DETECTED NOT DETECTED Final   Streptococcus agalactiae NOT DETECTED NOT  DETECTED Final   Streptococcus pneumoniae NOT DETECTED NOT DETECTED Final   Streptococcus pyogenes NOT DETECTED NOT DETECTED Final   A.calcoaceticus-baumannii NOT DETECTED NOT DETECTED Final   Bacteroides fragilis NOT DETECTED NOT DETECTED Final   Enterobacterales NOT DETECTED NOT DETECTED Final   Enterobacter cloacae complex NOT DETECTED NOT DETECTED Final   Escherichia coli NOT DETECTED NOT DETECTED Final   Klebsiella aerogenes NOT DETECTED NOT DETECTED Final   Klebsiella oxytoca NOT DETECTED NOT DETECTED Final   Klebsiella pneumoniae NOT DETECTED NOT DETECTED Final   Proteus species NOT DETECTED NOT DETECTED Final   Salmonella species NOT DETECTED NOT DETECTED Final   Serratia marcescens NOT DETECTED NOT DETECTED Final   Haemophilus influenzae NOT DETECTED NOT DETECTED Final   Neisseria meningitidis NOT DETECTED NOT DETECTED Final   Pseudomonas aeruginosa NOT DETECTED NOT DETECTED Final   Stenotrophomonas maltophilia NOT DETECTED NOT DETECTED Final   Candida albicans NOT DETECTED NOT DETECTED Final   Candida auris NOT DETECTED NOT DETECTED Final   Candida glabrata NOT DETECTED NOT DETECTED Final   Candida krusei NOT DETECTED NOT DETECTED Final   Candida parapsilosis NOT DETECTED NOT DETECTED Final   Candida tropicalis NOT DETECTED NOT DETECTED Final   Cryptococcus neoformans/gattii NOT DETECTED NOT DETECTED Final   Meth resistant mecA/C and MREJ NOT DETECTED NOT DETECTED Final    Comment: Performed at [facility], [address] [place], [place] [address]  Culture, blood (x 2)     Status: Abnormal   Collection Time: 07/03/21  6:37 PM   Specimen: BLOOD  Result Value Ref Range Status   Specimen Description   Final    BLOOD RIGHT HAND Performed at [facility], [address] [place], [place] [address]    Special Requests   Final    BOTTLES DRAWN AEROBIC AND ANAEROBIC Blood Culture adequate volume Performed at [facility], [address]  [place], [place] [address]    Culture  Setup Time   Final    GRAM POSITIVE COCCI IN BOTH AEROBIC AND ANAEROBIC BOTTLES CRITICAL RESULT CALLED TO, READ BACK BY AND VERIFIED WITH: NATHAN BELUE AT 6283 07/04/21.PMF Performed at [facility], [address] [place], [place] [address]    Culture (A)  Final    STAPHYLOCOCCUS AUREUS SUSCEPTIBILITIES PERFORMED ON PREVIOUS CULTURE WITHIN THE LAST 5 DAYS. Performed at [facility], [place] [address] [place], [place] [address]    Report Status 07/06/2021 FINAL  Final  CULTURE, BLOOD (ROUTINE X 2) w Reflex to ID Panel     Status: None (Preliminary result)   Collection Time: 07/05/21  7:47 AM   Specimen: BLOOD  Result Value Ref Range Status   Specimen Description BLOOD RIGHT WRIST  Final   Special Requests   Final    BOTTLES DRAWN AEROBIC AND ANAEROBIC Blood Culture adequate volume   Culture   Final    NO GROWTH 3 DAYS Performed at [facility], [address] [place], [place] [address]    Report Status PENDING  Incomplete  CULTURE, BLOOD (ROUTINE X 2) w Reflex to ID Panel     Status: None (Preliminary result)   Collection Time: 07/05/21  7:47 AM   Specimen: BLOOD  Result Value Ref Range Status   Specimen Description BLOOD BLOOD RIGHT HAND  Final   Special Requests   Final    BOTTLES DRAWN AEROBIC ONLY Blood Culture adequate volume   Culture   Final    NO GROWTH 3 DAYS Performed at [facility], [address] [place], [place] [address]    Report Status PENDING  Incomplete     Labs: BNP (last 3 results) Recent Labs    07/05/21 0747  BNP 092.3*   Basic Metabolic Panel: Recent Labs  Lab 07/03/21 1655 07/03/21 2301 07/04/21 0506 07/05/21 0747 07/06/21 0814 07/07/21 0445 07/08/21 0628  NA 124*   < > 128* 127* 125* 124* 124*  K 3.4*   < > 4.1 3.7 3.4* 3.8 3.7  CL 85*   < > 94* 92* 91* 90* 91*  CO2 27   < > _0 GLUCOSE 267*   < > 281* 282* 230* 235* 194*  BUN 31*   < > 29* 30* 36*  27* 21*  CREATININE 1.19   < > 0.91 0.87 1.04 0.91 0.89  CALCIUM 8.1*   < > 7.8* 7.8* 7.5* 7.6* 7.2*  MG 2.2  --   --   --   --   --   --   PHOS 3.4  --   --   --   --   --   --    < > = values in this interval not displayed.   Liver Function Tests: Recent Labs  Lab 07/07/21 1213  AST 27  ALT 16  ALKPHOS 64  BILITOT 1.4*  PROT 6.8  ALBUMIN 1.9*   No results for input(s): LIPASE, AMYLASE in the last 168 hours. No results for input(s): AMMONIA in the last 168 hours. CBC: Recent Labs  Lab 07/03/21 1655 07/04/21 0506 07/05/21 0747 07/06/21 0814 07/07/21 0445 07/08/21 0628  WBC 13.1* 16.2* 15.9* 13.8* 14.4* 10.2  NEUTROABS 11.5*  --  13.3* 11.5* 11.9* 8.3*  HGB 15.1 14.3 12.6* 12.6* 11.9* 10.7*  HCT 41.9 38.7* 34.6* 35.2* 32.4* 30.2*  MCV 91.1 94.2 94.0 93.4 92.3 93.2  PLT 67* 59* 71* 57* 78* 84*   Cardiac Enzymes: No results for input(s): CKTOTAL, CKMB, CKMBINDEX, TROPONINI in the last 168 hours. BNP: Invalid input(s): POCBNP CBG: Recent Labs  Lab 07/07/21 1250 07/07/21 1700 07/07/21 2122 07/08/21 0920 07/08/21 1134  GLUCAP 199* 186* 300* 258* 287*   D-Dimer No results for input(s): DDIMER in the last 72 hours. Hgb A1c No results for input(s): HGBA1C in the last 72 hours. Lipid Profile Recent Labs    07/08/21 0628  CHOL 96  HDL <10*  LDLCALC PENDING  TRIG 102  CHOLHDL PENDING   Thyroid function studies Recent Labs    07/07/21 1213  TSH 0.162*  T4TOTAL 5.0   Anemia work up No results for input(s): VITAMINB12, FOLATE, FERRITIN, TIBC, IRON, RETICCTPCT in the last 72 hours. Urinalysis    Component Value Date/Time   COLORURINE YELLOW (A) 07/03/2021 1509   APPEARANCEUR CLEAR (A) 07/03/2021 1509   LABSPEC 1.012 07/03/2021 1509   PHURINE 5.0 07/03/2021 1509   GLUCOSEU >=500 (A) 07/03/2021 1509   HGBUR SMALL (A) 07/03/2021 1509   BILIRUBINUR NEGATIVE 07/03/2021 1509   KETONESUR NEGATIVE 07/03/2021 1509   PROTEINUR NEGATIVE 07/03/2021 1509    NITRITE NEGATIVE 07/03/2021 1509   LEUKOCYTESUR NEGATIVE 07/03/2021 1509   Sepsis Labs Invalid input(s): PROCALCITONIN,  WBC,  LACTICIDVEN Microbiology Recent Results (from the past 240 hour(s))  Resp Panel by RT-PCR (Flu A&B, Covid) Nasopharyngeal Swab     Status: None   Collection Time: 07/03/21 12:08 PM   Specimen: Nasopharyngeal Swab; Nasopharyngeal(NP) swabs in vial transport medium  Result Value Ref Range Status   SARS Coronavirus 2 by RT PCR NEGATIVE NEGATIVE Final    Comment: (NOTE) SARS-CoV-2 target nucleic acids are NOT DETECTED.  The SARS-CoV-2 RNA is generally detectable in upper respiratory specimens during the acute phase of infection. The lowest concentration of SARS-CoV-2 viral copies this assay can detect is 138 copies/mL. A negative result does not preclude SARS-Cov-2 infection and should not be used as the sole basis for treatment or other patient management decisions. A negative result may occur with  improper specimen collection/handling, submission of specimen other than nasopharyngeal swab, presence of viral mutation(s) within the areas targeted by this assay, and inadequate number of viral copies(<138 copies/mL). A negative result must be combined with clinical observations, patient history, and epidemiological information. The expected result is Negative.  Fact Sheet for Patients:  EntrepreneurPulse.com.au  Fact Sheet for Healthcare Providers:  IncredibleEmployment.be  This test is no t yet approved or cleared by the [place] FDA and  has been authorized for detection and/or diagnosis of SARS-CoV-2 by FDA under an Emergency Use Authorization (EUA). This EUA will remain  in effect (meaning this test can be used) for the duration of the COVID-19 declaration under Section 564(b)(1) of the Act, 21 U.S.C.section 360bbb-3(b)(1), unless the authorization is terminated  or revoked sooner.       Influenza A by PCR  NEGATIVE NEGATIVE Final   Influenza B by PCR NEGATIVE NEGATIVE Final    Comment: (NOTE) The Xpert Xpress SARS-CoV-2/FLU/RSV plus assay is intended as an aid in the diagnosis of influenza from Nasopharyngeal swab specimens and should not be used as a sole basis for treatment. Nasal washings and aspirates are unacceptable for Xpert Xpress SARS-CoV-2/FLU/RSV testing.  Fact Sheet for Patients: EntrepreneurPulse.com.au  Fact Sheet for Healthcare Providers: IncredibleEmployment.be  This test is not yet approved or cleared by the [place] FDA and has been authorized for detection and/or diagnosis of SARS-CoV-2 by FDA under an Emergency Use Authorization (EUA). This EUA will remain in effect (meaning this test can be used) for the duration of the COVID-19 declaration under Section 564(b)(1) of the Act, 21 U.S.C. section 360bbb-3(b)(1), unless the authorization is terminated or revoked.  Performed at [facility], [address] [place], [place] [address]   Urine Culture     Status: Abnormal   Collection Time: 07/03/21  3:09 PM   Specimen: Urine, Random  Result Value Ref Range Status   Specimen Description   Final    URINE, RANDOM Performed at [facility], [address], [place], [place] [address]    Special Requests   Final    NONE Performed at [facility], [address] [place], [place] [address]    Culture MULTIPLE SPECIES PRESENT, SUGGEST RECOLLECTION (A)  Final   Report Status 07/05/2021 FINAL  Final  Culture, blood (x 2)     Status: Abnormal   Collection Time: 07/03/21  4:55 PM   Specimen: BLOOD  Result Value Ref Range Status   Specimen Description   Final    BLOOD LEFT ANTECUBITAL Performed at [facility], [address], [place], [place] [address]    Special Requests   Final    BOTTLES DRAWN AEROBIC AND  ANAEROBIC Blood Culture results may not be optimal due to an excessive volume of  blood received in culture bottles Performed at [facility], [address] [place], [place] [address]    Culture  Setup Time   Final    IN BOTH AEROBIC AND ANAEROBIC BOTTLES GRAM POSITIVE COCCI CRITICAL RESULT CALLED TO, READ BACK BY AND VERIFIED WITH: NATHAN BELUE AT 7017 07/04/21.PMF Performed at [facility], [place] [address] [place], [place] [address]    Culture STAPHYLOCOCCUS AUREUS (A)  Final   Report Status 07/06/2021 FINAL  Final   Organism ID, Bacteria STAPHYLOCOCCUS AUREUS  Final      Susceptibility   Staphylococcus aureus - MIC*    CIPROFLOXACIN <=0.5 SENSITIVE Sensitive     ERYTHROMYCIN <=0.25 SENSITIVE Sensitive     GENTAMICIN <=0.5 SENSITIVE Sensitive     OXACILLIN <=0.25 SENSITIVE Sensitive     TETRACYCLINE <=1 SENSITIVE Sensitive     VANCOMYCIN <=0.5 SENSITIVE Sensitive     TRIMETH/SULFA <=10 SENSITIVE Sensitive     CLINDAMYCIN <=0.25 SENSITIVE Sensitive     RIFAMPIN <=0.5 SENSITIVE Sensitive     Inducible Clindamycin NEGATIVE Sensitive     * STAPHYLOCOCCUS AUREUS  Blood Culture ID Panel (Reflexed)     Status: Abnormal   Collection Time: 07/03/21  4:55 PM  Result Value Ref Range Status   Enterococcus faecalis NOT DETECTED NOT DETECTED Final   Enterococcus Faecium NOT DETECTED NOT DETECTED Final   Listeria monocytogenes NOT DETECTED NOT DETECTED Final   Staphylococcus species DETECTED (A) NOT DETECTED Final    Comment: CRITICAL RESULT CALLED TO, READ BACK BY AND VERIFIED WITH: NATHAN BELUE AT 3009 07/04/21.PMF    Staphylococcus aureus (BCID) DETECTED (A) NOT DETECTED Final    Comment: CRITICAL RESULT CALLED TO, READ BACK BY AND VERIFIED WITH: NATHAN BELUE AT 2330 07/04/21.PMF    Staphylococcus epidermidis NOT DETECTED NOT DETECTED Final   Staphylococcus lugdunensis NOT DETECTED NOT DETECTED Final   Streptococcus species NOT DETECTED NOT DETECTED Final   Streptococcus agalactiae NOT DETECTED NOT DETECTED Final   Streptococcus pneumoniae NOT  DETECTED NOT DETECTED Final   Streptococcus pyogenes NOT DETECTED NOT DETECTED Final   A.calcoaceticus-baumannii NOT DETECTED NOT DETECTED Final   Bacteroides fragilis NOT DETECTED NOT DETECTED Final   Enterobacterales NOT DETECTED NOT DETECTED Final   Enterobacter cloacae complex NOT DETECTED NOT DETECTED Final   Escherichia coli NOT DETECTED NOT DETECTED Final   Klebsiella aerogenes NOT DETECTED NOT DETECTED Final   Klebsiella oxytoca NOT DETECTED NOT DETECTED Final   Klebsiella pneumoniae NOT DETECTED NOT DETECTED Final   Proteus species NOT DETECTED NOT DETECTED Final   Salmonella species NOT DETECTED NOT DETECTED Final   Serratia marcescens NOT DETECTED NOT DETECTED Final   Haemophilus influenzae NOT DETECTED NOT DETECTED Final   Neisseria meningitidis NOT DETECTED NOT DETECTED Final   Pseudomonas aeruginosa NOT DETECTED NOT DETECTED Final   Stenotrophomonas maltophilia NOT DETECTED NOT DETECTED Final   Candida albicans NOT DETECTED NOT DETECTED Final   Candida auris NOT DETECTED NOT DETECTED Final   Candida glabrata NOT DETECTED NOT DETECTED Final   Candida krusei NOT DETECTED NOT DETECTED Final   Candida parapsilosis NOT DETECTED NOT DETECTED Final   Candida tropicalis NOT DETECTED NOT DETECTED Final   Cryptococcus neoformans/gattii NOT DETECTED NOT DETECTED Final   Meth resistant mecA/C and MREJ NOT DETECTED NOT DETECTED Final    Comment: Performed at [facility], [address], [place], [place] [address]  Culture,  blood (x 2)     Status: Abnormal   Collection Time: 07/03/21  6:37 PM   Specimen: BLOOD  Result Value Ref Range Status   Specimen Description   Final    BLOOD RIGHT HAND Performed at [facility], [address], [place], [place] [address]    Special Requests   Final    BOTTLES DRAWN AEROBIC AND ANAEROBIC Blood Culture adequate volume Performed at [facility], [address] [place], [place] [address]    Culture  Setup Time    Final    GRAM POSITIVE COCCI IN BOTH AEROBIC AND ANAEROBIC BOTTLES CRITICAL RESULT CALLED TO, READ BACK BY AND VERIFIED WITH: NATHAN BELUE AT 6270 07/04/21.PMF Performed at [facility], [address] [place], [place] [address]    Culture (A)  Final    STAPHYLOCOCCUS AUREUS SUSCEPTIBILITIES PERFORMED ON PREVIOUS CULTURE WITHIN THE LAST 5 DAYS. Performed at [facility], [place] [address], [place], [place] [address]    Report Status 07/06/2021 FINAL  Final  CULTURE, BLOOD (ROUTINE X 2) w Reflex to ID Panel     Status: None (Preliminary result)   Collection Time: 07/05/21  7:47 AM   Specimen: BLOOD  Result Value Ref Range Status   Specimen Description BLOOD RIGHT WRIST  Final   Special Requests   Final    BOTTLES DRAWN AEROBIC AND ANAEROBIC Blood Culture adequate volume   Culture   Final    NO GROWTH 3 DAYS Performed at [facility], [address], [place], [place] [address]    Report Status PENDING  Incomplete  CULTURE, BLOOD (ROUTINE X 2) w Reflex to ID Panel     Status: None (Preliminary result)   Collection Time: 07/05/21  7:47 AM   Specimen: BLOOD  Result Value Ref Range Status   Specimen Description BLOOD BLOOD RIGHT HAND  Final   Special Requests   Final    BOTTLES DRAWN AEROBIC ONLY Blood Culture adequate volume   Culture   Final    NO GROWTH 3 DAYS Performed at [facility], [address] [place], [place] [address]    Report Status PENDING  Incomplete     Time coordinating discharge: Over 30 minutes  SIGNED:   Nolberto Hanlon, MD  [facility] 07/08/2021, 2:14 PM Pager   If 7PM-7AM, please contact night-coverage [email or web address] Password [email or web address]

## 2021-07-08 NOTE — Progress Notes (Signed)
Called Bevely [place] at [facility] to give report. No questions at this time.

## 2021-07-09 ENCOUNTER — Inpatient Hospital Stay (HOSPITAL_COMMUNITY): Payer: Self-pay

## 2021-07-09 DIAGNOSIS — I330 Acute and subacute infective endocarditis: Secondary | ICD-10-CM

## 2021-07-09 DIAGNOSIS — I459 Conduction disorder, unspecified: Secondary | ICD-10-CM

## 2021-07-09 DIAGNOSIS — R7881 Bacteremia: Secondary | ICD-10-CM

## 2021-07-09 DIAGNOSIS — I079 Rheumatic tricuspid valve disease, unspecified: Secondary | ICD-10-CM

## 2021-07-09 LAB — PROTEIN ELECTRO, RANDOM URINE
Albumin ELP, Urine: 18.3 %
Alpha-1-Globulin, U: 3.9 %
Alpha-2-Globulin, U: 13.2 %
Beta Globulin, U: 23.5 %
Gamma Globulin, U: 41.2 %
Total Protein, Urine: 19.6 mg/dL

## 2021-07-09 LAB — RENAL FUNCTION PANEL
Albumin: 1.6 g/dL — ABNORMAL LOW (ref 3.5–5.0)
Anion gap: 9 (ref 5–15)
BUN: 16 mg/dL (ref 6–20)
CO2: 23 mmol/L (ref 22–32)
Calcium: 7.2 mg/dL — ABNORMAL LOW (ref 8.9–10.3)
Chloride: 88 mmol/L — ABNORMAL LOW (ref 98–111)
Creatinine, Ser: 0.89 mg/dL (ref 0.61–1.24)
GFR, Estimated: >60 mL/min (ref >60)
Glucose, Bld: 225 mg/dL — ABNORMAL HIGH (ref 70–99)
Phosphorus: 3.8 mg/dL (ref 2.5–4.6)
Potassium: 4.6 mmol/L (ref 3.5–5.1)
Sodium: 120 mmol/L — ABNORMAL LOW (ref 135–145)

## 2021-07-09 LAB — PROTEIN ELECTROPHORESIS, SERUM
A/G Ratio: 0.5 — ABNORMAL LOW (ref 0.7–1.7)
Albumin ELP: 2.1 g/dL — ABNORMAL LOW (ref 2.9–4.4)
Alpha-1-Globulin: 0.4 g/dL (ref 0.0–0.4)
Alpha-2-Globulin: 0.9 g/dL (ref 0.4–1.0)
Beta Globulin: 1 g/dL (ref 0.7–1.3)
Gamma Globulin: 2.2 g/dL — ABNORMAL HIGH (ref 0.4–1.8)
Globulin, Total: 4.4 g/dL — ABNORMAL HIGH (ref 2.2–3.9)
M-Spike, %: 0.2 g/dL — ABNORMAL HIGH
Total Protein ELP: 6.5 g/dL (ref 6.0–8.5)

## 2021-07-09 LAB — LIPID PANEL
Cholesterol: 96 mg/dL (ref 0–200)
HDL: <10 mg/dL — ABNORMAL LOW (ref >40)
Triglycerides: 102 mg/dL (ref <150)
VLDL: 20 mg/dL (ref 0–40)

## 2021-07-09 LAB — GLUCOSE, CAPILLARY
Glucose-Capillary: 147 mg/dL — ABNORMAL HIGH (ref 70–99)
Glucose-Capillary: 237 mg/dL — ABNORMAL HIGH (ref 70–99)
Glucose-Capillary: 223 mg/dL — ABNORMAL HIGH (ref 70–99)
Glucose-Capillary: 225 mg/dL — ABNORMAL HIGH (ref 70–99)

## 2021-07-09 LAB — SODIUM, URINE, RANDOM: Sodium, Ur: 37 mmol/L

## 2021-07-09 IMAGING — DX DG ORTHOPANTOGRAM /PANORAMIC
1 series · 1 of 1 positions shown · non-contrast
Comparison: None.

CLINICAL DATA: Pre cardiac surgery.

EXAM:
ORTHOPANTOGRAM/PANORAMIC

[view not recorded]
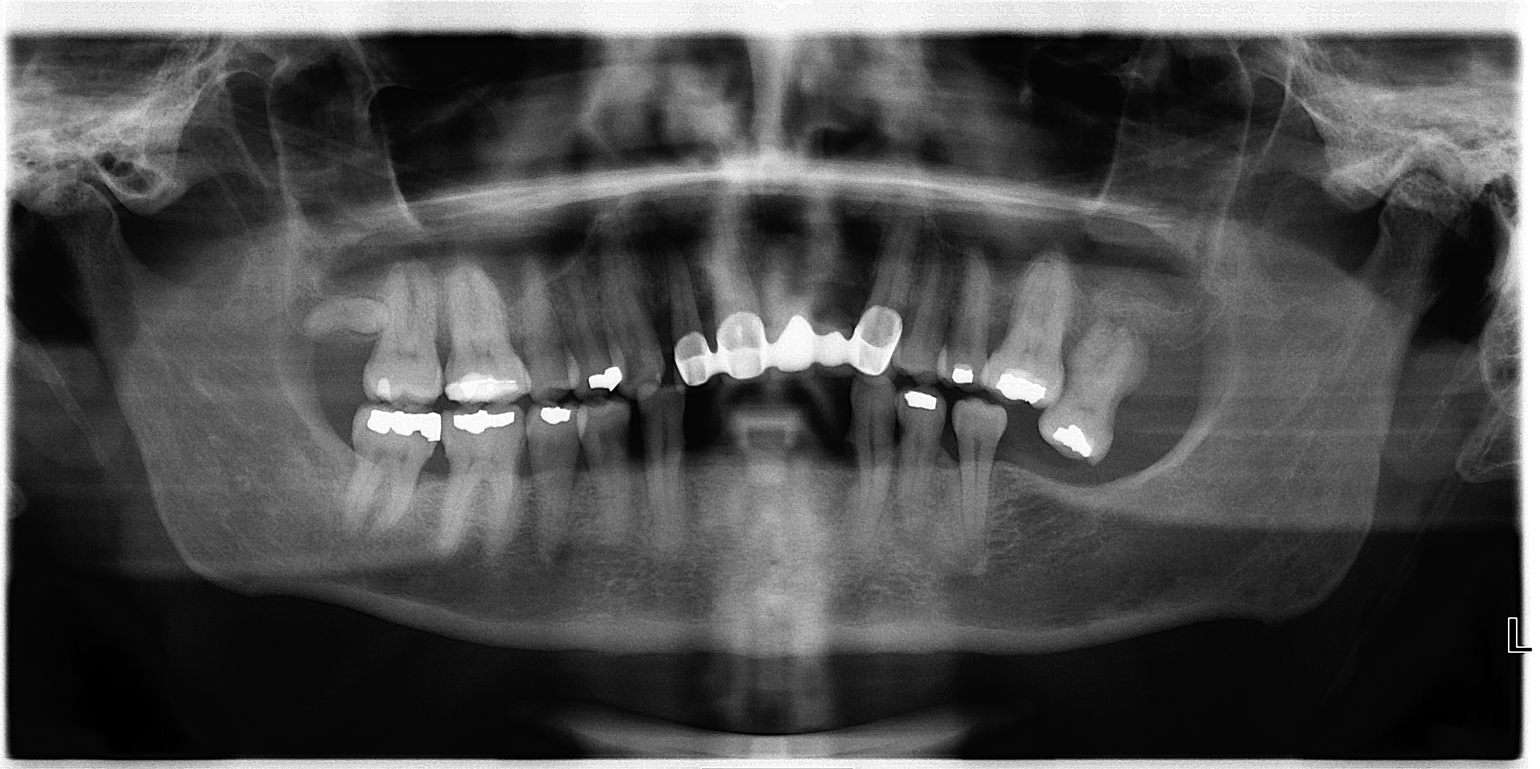

[1 of 1 positions shown; findings below may reference images not displayed]

FINDINGS: No fracture or lytic destruction is noted. Extensive dental work is
noted. Several left posterior mandibular molars are absent.
IMPRESSION: No fracture or lytic destruction is noted.

## 2021-07-09 MED ORDER — INSULIN ASPART 100 UNIT/ML IJ SOLN
0.0000 [IU] | Freq: Every day | INTRAMUSCULAR | Status: DC
  Administered 2021-07-09 – 2021-07-14 (×4): 2 [IU] via SUBCUTANEOUS

## 2021-07-09 MED ORDER — INSULIN ASPART 100 UNIT/ML IJ SOLN
0.0000 [IU] | Freq: Three times a day (TID) | INTRAMUSCULAR | Status: DC
  Administered 2021-07-09 – 2021-07-10 (×3): 5 [IU] via SUBCUTANEOUS
  Administered 2021-07-10: 2 [IU] via SUBCUTANEOUS
  Administered 2021-07-11: 8 [IU] via SUBCUTANEOUS
  Administered 2021-07-11: 2 [IU] via SUBCUTANEOUS
  Administered 2021-07-12: 3 [IU] via SUBCUTANEOUS
  Administered 2021-07-12: 2 [IU] via SUBCUTANEOUS
  Administered 2021-07-12: 8 [IU] via SUBCUTANEOUS
  Administered 2021-07-13: 5 [IU] via SUBCUTANEOUS
  Administered 2021-07-13: 2 [IU] via SUBCUTANEOUS
  Administered 2021-07-13 – 2021-07-14 (×2): 8 [IU] via SUBCUTANEOUS
  Administered 2021-07-14: 5 [IU] via SUBCUTANEOUS
  Administered 2021-07-14 – 2021-07-15 (×2): 8 [IU] via SUBCUTANEOUS
  Administered 2021-07-15: 2 [IU] via SUBCUTANEOUS
  Administered 2021-07-15: 3 [IU] via SUBCUTANEOUS
  Administered 2021-07-16: 8 [IU] via SUBCUTANEOUS
  Administered 2021-07-16 – 2021-07-17 (×2): 2 [IU] via SUBCUTANEOUS
  Administered 2021-07-17: 3 [IU] via SUBCUTANEOUS
  Administered 2021-07-18: 2 [IU] via SUBCUTANEOUS
  Administered 2021-07-18 – 2021-07-19 (×3): 3 [IU] via SUBCUTANEOUS
  Administered 2021-07-20: 2 [IU] via SUBCUTANEOUS

## 2021-07-09 MED ORDER — SODIUM CHLORIDE 0.9 % IV BOLUS
500.0000 mL | Freq: Once | INTRAVENOUS | Status: AC
  Administered 2021-07-09: 500 mL via INTRAVENOUS

## 2021-07-09 NOTE — Consult Note (Signed)
[facility]  INPATIENT CONSULTATION  Reason for Consultation: hyponatremia Requesting Provider: Dr. Sallyanne Kuster  HPI: Joshua Cole is an 60 y.o. 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   Presented [facility] 10/22 with weakness, fevers, chills, arthralgias.  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   CTA for hypoxia with no PE, but septic emboli suspected.  On cefazolin.  Transferred to [facility] for eval by CT surgery.  Was also eval by ortho at [facility] after imaging ? Prosthetic hip effusions.   Initial serum sodium 122 10/22 then 10/23 128 > 10/24 127 > 10/25 125 > 10/26 124 > 10/27 124.  No labs from today. BUN has been in 20-30s, Cr 0.9-1.  Initial K 3.4 but has been ok in the past few days.   Day of admission serum osm 279, urine osm 428, urine sodium < 10.  Pt was c/o thirst.  TSH was 0.16.  Nephrology c/s felt hyponatremia was related to weekend EtOH 20beers.  He was treated with 1234mL fluid restriction and started on lasix 20 daily. Looks like he's been net negative according to notes from [facility].   Currently pt denies complaints.  No HA, vision changes, confusion.  He does admit to quite significant thirst and a dry mouth which is not typical for him.  Thirsty for past few days .  PMH: Past Medical History:  Diagnosis Date   Arthritis    DJD (degenerative joint disease)    Gout    PSH: Past Surgical History:  Procedure Laterality Date   JOINT REPLACEMENT     TEE WITHOUT CARDIOVERSION N/A 07/07/2021   Procedure: TRANSESOPHAGEAL ECHOCARDIOGRAM (TEE);  Surgeon: Kate Sable, MD;  Location: [facility] ORS;  Service: Cardiovascular;  Laterality: N/A;     Past Medical History:  Diagnosis Date   Arthritis    DJD (degenerative joint disease)    Gout     Medications:  I have reviewed the  patient's current medications. Cefazolin 2g A3F Folic acid, thiamine insulin Lasix 20 po daily SQ prophy hep ASA 81 PO PPI  PRN ativan and morphine, PRN zofran and oxycodine  Medications Prior to Admission  Medication Sig Dispense Refill   acetaminophen (TYLENOL) 325 MG tablet Take 2 tablets (650 mg total) by mouth every 6 (six) hours as needed for mild pain or fever.     ceFAZolin (ANCEF) 2-4 GM/100ML-% IVPB Inject 100 mLs (2 g total) into the vein every 8 (eight) hours. 1 each    folic acid (FOLVITE) 1 MG tablet Take 1 tablet (1 mg total) by mouth daily.     furosemide (LASIX) 20 MG tablet Take 1 tablet (20 mg total) by mouth daily. 30 tablet    insulin aspart (NOVOLOG) 100 UNIT/ML injection Inject 0-5 Units into the skin at bedtime. 10 mL 11   insulin aspart (NOVOLOG) 100 UNIT/ML injection Inject 0-20 Units into the skin 3 (three) times daily with meals. 10 mL 11   insulin aspart (NOVOLOG) 100 UNIT/ML injection Inject 6 Units into the skin 3 (three) times daily with meals. 10 mL 11   insulin glargine-yfgn (SEMGLEE) 100 UNIT/ML injection Inject 0.23 mLs (23 Units total) into the skin at bedtime. 10 mL 11   LORazepam (ATIVAN) 2 MG/ML injection Inject 0.5-1 mLs (1-2 mg total) into the vein every hour as needed (seizures or agitation). 1 mL  0   morphine 2 MG/ML injection Inject 1 mL (2 mg total) into the vein every 3 (three) hours as needed. 1 mL 0   Multiple Vitamin (MULTIVITAMIN WITH MINERALS) TABS tablet Take 1 tablet by mouth daily.     pantoprazole (PROTONIX) 40 MG tablet Take 1 tablet (40 mg total) by mouth daily.     thiamine 100 MG tablet Take 1 tablet (100 mg total) by mouth daily.      ALLERGIES:  No Known Allergies  FAM HX: Family History  Problem Relation Age of Onset   Psoriasis Sister     Social History:   reports that he has never smoked. He has never used smokeless tobacco. He reports current alcohol use. He reports that he does not use drugs.  ROS: 12 system ROS  neg except per HPI above  Blood pressure (!) 145/62, pulse 72, temperature 98.7 F (37.1 C), temperature source Oral, resp. rate 19, height 5\' 10"  (1.778 m), weight 111.1 kg, SpO2 99 %. PHYSICAL EXAM: Gen: nontoxic lying flat in bed  Eyes: anicteric, EOMI ENT: MM dry Neck: supple, no JVD CV:  RRR, no murmur appreciated Abd: soft, obese Lungs: clear, normal WOB RA;  I don't see stigmata of cirrhosis GU: no foley Extr:  no edema Neuro: nonfocal   Results for orders placed or performed during the hospital encounter of 07/08/21 (from the past 48 hour(s))  Glucose, capillary     Status: Abnormal   Collection Time: 07/08/21  5:14 PM  Result Value Ref Range   Glucose-Capillary 167 (H) 70 - 99 mg/dL    Comment: Glucose reference range applies only to samples taken after fasting for at least 8 hours.  CBC     Status: Abnormal   Collection Time: 07/08/21  5:47 PM  Result Value Ref Range   WBC 9.0 4.0 - 10.5 K/uL   RBC 3.32 (L) 4.22 - 5.81 MIL/uL   Hemoglobin 10.7 (L) 13.0 - 17.0 g/dL   HCT 31.7 (L) 39.0 - 52.0 %   MCV 95.5 80.0 - 100.0 fL   MCH 32.2 26.0 - 34.0 pg   MCHC 33.8 30.0 - 36.0 g/dL   RDW 12.8 11.5 - 15.5 %   Platelets 80 (L) 150 - 400 K/uL    Comment: Immature Platelet Fraction may be clinically indicated, consider ordering this additional test [id number] REPEATED TO VERIFY PLATELET COUNT CONFIRMED BY SMEAR PLATELETS APPEAR DECREASED    nRBC 0.0 0.0 - 0.2 %    Comment: Performed at [facility], [place] [address] [place], [place] [address]  Creatinine, serum     Status: None   Collection Time: 07/08/21  5:47 PM  Result Value Ref Range   Creatinine, Ser 0.96 0.61 - 1.24 mg/dL   GFR, Estimated >60 >60 mL/min    Comment: (NOTE) Calculated using the CKD-EPI Creatinine Equation (2021) Performed at [facility] [address], [place], [place] [address]   Glucose, capillary     Status: Abnormal   Collection Time: 07/08/21  9:01 PM  Result Value Ref Range    Glucose-Capillary 167 (H) 70 - 99 mg/dL    Comment: Glucose reference range applies only to samples taken after fasting for at least 8 hours.  Glucose, capillary     Status: Abnormal   Collection Time: 07/09/21  6:11 AM  Result Value Ref Range   Glucose-Capillary 147 (H) 70 - 99 mg/dL    Comment: Glucose reference range applies only to samples taken  after fasting for at least 8 hours.    ECHO TEE  Result Date: 07/07/2021    TRANSESOPHOGEAL ECHO REPORT   Patient Name:   Joshua Cole Date of Exam: 07/07/2021 Medical Rec #:  [id number]        Height:       70.0 in Accession #:    [id number]       Weight:       249.3 lb Date of Birth:  [birth year]       BSA:          2.292 m Patient Age:    46 years         BP:           113/61 mmHg Patient Gender: M                HR:           71 bpm. Exam Location:  [facility] Procedure: Transesophageal Echo, Cardiac Doppler and Color Doppler Indications:     bacteremia  History:         Patient has prior history of Echocardiogram examinations, most                  recent 07/04/2021. Bacteremia.  Sonographer:     Sherrie Sport Referring Phys:  [id number] [place] Diagnosing Phys: Kate Sable MD PROCEDURE: The transesophogeal probe was passed without difficulty through the esophogus of the patient. Sedation performed by performing physician. The patient developed no complications during the procedure. IMPRESSIONS  1. Left ventricular ejection fraction, by estimation, is 55 to 60%. The left ventricle has normal function.  2. Right ventricular systolic function is normal. The right ventricular size is normal.  3. No left atrial/left atrial appendage thrombus was detected.  4. The mitral valve is normal in structure. Mild mitral valve regurgitation.  5. There is a mobile mass attached to the tricuspid valve (clip 132) consistent with a vegetation given the current clinical context.. The tricuspid valve is degenerative.  6. The aortic valve is tricuspid. Aortic valve  regurgitation is not visualized. 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 FINDINGS  Left Ventricle: Left ventricular ejection fraction, by estimation, is 55 to 60%. The left ventricle has normal function. The left ventricular internal cavity size was normal in size. Right Ventricle: The right ventricular size is normal. No increase in right ventricular wall thickness. Right ventricular systolic function is normal. Left Atrium: Left atrial size was normal in size. No left atrial/left atrial appendage thrombus was detected. Right Atrium: Right atrial size was normal in size. Pericardium: There is no evidence of pericardial effusion. Mitral Valve: The mitral valve is normal in structure. Mild mitral valve regurgitation. Tricuspid Valve: There is a mobile mass attached to the tricuspid valve (clip 132) consistent with a vegetation given the current clinical context. The tricuspid valve is degenerative in appearance. Tricuspid valve regurgitation is trivial. Aortic Valve: The aortic valve is tricuspid. Aortic valve regurgitation is not visualized. Pulmonic Valve: The pulmonic valve was normal in structure. Pulmonic valve regurgitation is not visualized. Aorta: The aortic root is normal in size and structure. IAS/Shunts: No atrial level shunt detected by color flow Doppler. 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 At [facility] for CTS consult.   **Hyponatremia:  euvolemic hyponatremia it appeared at admission which was suspected EtOH/beer related.  TSH sl low not hypothyroidism, BP ok and no electrolyte/bicarb issues to suggest adrenal insufficiency.   His serum sodium has stalled at 124 and really now, he looks like he's hypovolemic.    I'm going to liberalize his diet and d/c the fluid  restriction and loop diuretic for now.   Repeat urine indices. No indication for hypertonic saline currently.   Will follow closely - Labs now and again in AM.  On Strict I/Os, daily wts.   **Anemia: mild in 10s, monitor.   **EtOH overuse: thiamine/folate.    **DM: new dx, A1c was 7.8. Per primary.   **thrombocytopenia: Plt in 70-80s.  Heme/onc has been consulted.  SFLC normal.    Will follow, call with concerns.   Justin Mend 07/09/2021, 12:06 PM

## 2021-07-09 NOTE — Progress Notes (Signed)
Joshua Cole for Infectious Disease  Date of Admission:  07/08/2021           Reason for visit: Follow up on MSSA bacteremia, endocarditis  Current antibiotics: Cefazolin 10/22-present    ASSESSMENT & RECOMMENDATIONS:    60 y.o. 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  Transferred to [facility] yesterday for cardiothoracic evaluation.  Blood cultures cleared as of 07/05/21. --Continue cefazolin 2gm q8h --Follow cardiology and CT surgery plans.  Appreciate their recommendations --Follow repeat blood cultures to confirm clearance --Monitor right 1st MCP closely and consider imaging and/or aspiration to further evaluate, however, current cardiac needs take precedence.  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  EP was consulted at [facility] and recommended CT surgery place epicardial RA and RV leads at time of valve replacement. --Management per cardiology and CT surgery  #History of bilateral THA: Evaluated by orthopedics this admission due to MRI findings from 07/05/21.  There was low suspicion for periprosthetic hip infection based on exam findings and they did not recommend joint aspiration at this time. --Continue to monitor   #Newly diagnosed diabetes mellitus: Hemoglobin A1c 7.8. --Glycemic control  #Hx of gout     Active Problems:   Acute metabolic encephalopathy   Generalized weakness   Bacteremia   Acute bacterial endocarditis   Heart block   MSSA bacteremia    MEDICATIONS:    Scheduled Meds:  aspirin EC  81 mg Oral Daily   folic acid  1 mg Oral Daily   furosemide  20 mg Oral Daily   heparin  5,000 Units Subcutaneous Q8H   insulin aspart  0-15 Units Subcutaneous TID WC   insulin aspart  0-5 Units Subcutaneous QHS   insulin glargine-yfgn  23 Units Subcutaneous QHS   multivitamin  1 tablet Oral Daily   pantoprazole  40  mg Oral Daily   thiamine  100 mg Oral Daily   Continuous Infusions:  sodium chloride 20 mL/hr at 07/09/21 0004    ceFAZolin (ANCEF) IV 2 g (07/09/21 0504)   PRN Meds:.acetaminophen, LORazepam, morphine injection, nitroGLYCERIN, ondansetron (ZOFRAN) IV, oxyCODONE  SUBJECTIVE:   24 hour events:  Patient was transferred to [facility] overnight for higher level of care.  Afebrile, Tmax 98.9 No new imaging No new micro.  Blood cx 10/24 NGTD  No new complaints.  Still has right first MCP tenderness and swelling.  Waiting to hear what the next steps are regarding surgery.  Wants to know how long he may be in the hospital.  No other complaints.   Review of Systems  All other systems reviewed and are negative.    OBJECTIVE:   Blood pressure (!) 145/62, pulse 72, temperature 98.7 F (37.1 C), temperature source Oral, resp. rate 19, height 5\' 10"  (1.778 m), weight 111.1 kg, SpO2 97 %. Body mass index is 35.14 kg/m.  Physical Exam Constitutional:      General: He is not in acute distress.    Appearance: Normal appearance.  HENT:     Head: Normocephalic and atraumatic.  Eyes:     Extraocular Movements: Extraocular movements intact.     Conjunctiva/sclera: Conjunctivae normal.  Pulmonary:     Effort: Pulmonary effort is normal. No respiratory distress.  Abdominal:     General: There is no distension.     Palpations: Abdomen is soft.  Musculoskeletal:  General: Swelling and tenderness present.     Comments: Right first MCP joint swollen and tender  Skin:    General: Skin is warm and dry.  Neurological:     General: No focal deficit present.     Mental Status: He is alert and oriented to person, place, and time.  Psychiatric:        Mood and Affect: Mood normal.        Behavior: Behavior normal.     Lab Results: Lab Results  Component Value Date   WBC 9.0 07/08/2021   HGB 10.7 (L) 07/08/2021   HCT 31.7 (L) 07/08/2021   MCV 95.5 07/08/2021   PLT 80 (L)  07/08/2021    Lab Results  Component Value Date   NA 124 (L) 07/08/2021   K 3.7 07/08/2021   CO2 27 07/08/2021   GLUCOSE 194 (H) 07/08/2021   BUN 21 (H) 07/08/2021   CREATININE 0.96 07/08/2021   CALCIUM 7.2 (L) 07/08/2021   GFRNONAA >60 07/08/2021    Lab Results  Component Value Date   ALT 16 07/07/2021   AST 27 07/07/2021   ALKPHOS 64 07/07/2021   BILITOT 1.4 (H) 07/07/2021       Component Value Date/Time   CRP 24.0 (H) 07/08/2021 0628       Component Value Date/Time   ESRSEDRATE 82 (H) 07/08/2021 3419     I have reviewed the micro and lab results in Epic.  Imaging: ECHO TEE  Result Date: 07/07/2021    TRANSESOPHOGEAL ECHO REPORT   Patient Name:   Joshua Cole Date of Exam: 07/07/2021 Medical Rec #:  [id number]        Height:       70.0 in Accession #:    [id number]       Weight:       249.3 lb Date of Birth:  1960/10/25       BSA:          2.292 m Patient Age:    25 years         BP:           113/61 mmHg Patient Gender: M                HR:           71 bpm. Exam Location:  [facility] Procedure: Transesophageal Echo, Cardiac Doppler and Color Doppler Indications:     bacteremia  History:         Patient has prior history of Echocardiogram examinations, most                  recent 07/04/2021. Bacteremia.  Sonographer:     Sherrie Sport Referring Phys:  [id number] Joshua Cole Diagnosing Phys: Kate Sable MD PROCEDURE: The transesophogeal probe was passed without difficulty through the esophogus of the patient. Sedation performed by performing physician. The patient developed no complications during the procedure. IMPRESSIONS  1. Left ventricular ejection fraction, by estimation, is 55 to 60%. The left ventricle has normal function.  2. Right ventricular systolic function is normal. The right ventricular size is normal.  3. No left atrial/left atrial appendage thrombus was detected.  4. The mitral valve is normal in structure. Mild mitral valve regurgitation.  5. There is a  mobile mass attached to the tricuspid valve (clip 132) consistent with a vegetation given the current clinical context.. The tricuspid valve is degenerative.  6. The aortic valve is tricuspid. Aortic valve regurgitation is not visualized.  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 FINDINGS  Left Ventricle: Left ventricular ejection fraction, by estimation, is 55 to 60%. The left ventricle has normal function. The left ventricular internal cavity size was normal in size. Right Ventricle: The right ventricular size is normal. No increase in right ventricular wall thickness. Right ventricular systolic function is normal. Left Atrium: Left atrial size was normal in size. No left atrial/left atrial appendage thrombus was detected. Right Atrium: Right atrial size was normal in size. Pericardium: There is no evidence of pericardial effusion. Mitral Valve: The mitral valve is normal in structure. Mild mitral valve regurgitation. Tricuspid Valve: There is a mobile mass attached to the tricuspid valve (clip 132) consistent with a vegetation given the current clinical context. The tricuspid valve is degenerative in appearance. Tricuspid valve regurgitation is trivial. Aortic Valve: The aortic valve is tricuspid. Aortic valve regurgitation is not visualized. Pulmonic Valve: The pulmonic valve was normal in structure. Pulmonic valve regurgitation is not visualized. Aorta: The aortic root is normal in size and structure. IAS/Shunts: No atrial level shunt detected by color flow Doppler. Kate Sable MD Electronically signed by Kate Sable MD Signature Date/Time: 07/07/2021/5:55:00 PM    Final      Imaging independently reviewed in Epic.    Raynelle Highland for Infectious Disease [facility] [phone] pager 07/09/2021, 10:40 AM  I spent greater than 35 minutes with the patient including greater than 50% of  time in face to face counsel of the patient and in coordination of their care.

## 2021-07-09 NOTE — Progress Notes (Signed)
EKG reading called into on call provider. No change to POC

## 2021-07-09 NOTE — Progress Notes (Addendum)
Progress Note  Patient Name: Joshua Cole Date of Encounter: 07/09/2021  [facility] Cardiologist: Kathlyn Sacramento, MD   Subjective   No complaints, feel overwhelmed.   Inpatient Medications    Scheduled Meds:  aspirin EC  81 mg Oral Daily   folic acid  1 mg Oral Daily   furosemide  20 mg Oral Daily   heparin  5,000 Units Subcutaneous Q8H   insulin aspart  0-15 Units Subcutaneous TID WC   insulin aspart  0-5 Units Subcutaneous QHS   insulin glargine-yfgn  23 Units Subcutaneous QHS   multivitamin  1 tablet Oral Daily   pantoprazole  40 mg Oral Daily   thiamine  100 mg Oral Daily   Continuous Infusions:  sodium chloride 20 mL/hr at 07/09/21 0004    ceFAZolin (ANCEF) IV 2 g (07/09/21 0504)   PRN Meds: acetaminophen, LORazepam, morphine injection, nitroGLYCERIN, ondansetron (ZOFRAN) IV, oxyCODONE   Vital Signs    Vitals:   07/08/21 2344 07/09/21 0401 07/09/21 0500 07/09/21 0730  BP: 133/60 (!) 139/58  (!) 145/62  Pulse: (!) 55 87  72  Resp: 18   19  Temp: 97.7 F (36.5 C) 97.6 F (36.4 C) 98.9 F (37.2 C) 98.7 F (37.1 C)  TempSrc: Oral Oral Oral Oral  SpO2: 96% 95%  97%  Weight:      Height:        Intake/Output Summary (Last 24 hours) at 07/09/2021 0955 Last data filed at 07/09/2021 0004 Gross per 24 hour  Intake 204.39 ml  Output 900 ml  Net -695.61 ml   Last 3 Weights 07/08/2021 07/03/2021 07/03/2021  Weight (lbs) 244 lb 14.9 oz 249 lb 4.8 oz 232 lb  Weight (kg) 111.1 kg 113.082 kg 105.235 kg      Telemetry    High degree AVB, rates 70s - Personally Reviewed  ECG    High degree AVB, rate 77 bpm- Personally Reviewed  Physical Exam   GEN: No acute distress.   Neck: No JVD Cardiac: RRR, no murmurs, rubs, or gallops.  Respiratory: Clear to auscultation bilaterally. GI: Soft, nontender, non-distended  MS: No edema; No deformity. Neuro:  Nonfocal  Psych: Normal affect   Labs    High Sensitivity Troponin:  No results for input(s):  TROPONINIHS in the last 720 hours.   Chemistry Recent Labs  Lab 07/03/21 1655 07/03/21 2301 07/06/21 0814 07/07/21 0445 07/07/21 1213 07/08/21 0628 07/08/21 1747  NA 124*   < > 125* 124*  --  124*  --   K 3.4*   < > 3.4* 3.8  --  3.7  --   CL 85*   < > 91* 90*  --  91*  --   CO2 27   < > 25 26  --  27  --   GLUCOSE 267*   < > 230* 235*  --  194*  --   BUN 31*   < > 36* 27*  --  21*  --   CREATININE 1.19   < > 1.04 0.91  --  0.89 0.96  CALCIUM 8.1*   < > 7.5* 7.6*  --  7.2*  --   MG 2.2  --   --   --   --   --   --   PROT  --   --   --   --  6.8  --   --   ALBUMIN  --   --   --   --  1.9*  --   --  AST  --   --   --   --  27  --   --   ALT  --   --   --   --  16  --   --   ALKPHOS  --   --   --   --  64  --   --   BILITOT  --   --   --   --  1.4*  --   --   GFRNONAA >60   < > >60 >60  --  >60 >60  ANIONGAP 12   < > 9 8  --  6  --    < > = values in this interval not displayed.    Lipids  Recent Labs  Lab 07/08/21 0628  CHOL 96  TRIG 102  HDL <10*  LDLCALC NOT CALCULATED  CHOLHDL NOT CALCULATED    Hematology Recent Labs  Lab 07/07/21 0445 07/08/21 0628 07/08/21 1747  WBC 14.4* 10.2 9.0  RBC 3.51* 3.24* 3.32*  HGB 11.9* 10.7* 10.7*  HCT 32.4* 30.2* 31.7*  MCV 92.3 93.2 95.5  MCH 33.9 33.0 32.2  MCHC 36.7* 35.4 33.8  RDW 12.5 12.8 12.8  PLT 78* 84* 80*   Thyroid  Recent Labs  Lab 07/07/21 1213  TSH 0.162*    BNP Recent Labs  Lab 07/05/21 0747  BNP 219.6*    DDimer  Recent Labs  Lab 07/04/21 0506  DDIMER 3.78*     Radiology    ECHO TEE  Result Date: 07/07/2021    TRANSESOPHOGEAL ECHO REPORT   Patient Name:   ANISH VANA Date of Exam: 07/07/2021 Medical Rec #:  [id number]        Height:       70.0 in Accession #:    [id number]       Weight:       249.3 lb Date of Birth:  [birth year]       BSA:          2.292 m Patient Age:    7 years         BP:           113/61 mmHg Patient Gender: M                HR:           71 bpm. Exam Location:   [facility] Procedure: Transesophageal Echo, Cardiac Doppler and Color Doppler Indications:     bacteremia  History:         Patient has prior history of Echocardiogram examinations, most                  recent 07/04/2021. Bacteremia.  Sonographer:     Sherrie Sport Referring Phys:  [id number] [place] Diagnosing Phys: Kate Sable MD PROCEDURE: The transesophogeal probe was passed without difficulty through the esophogus of the patient. Sedation performed by performing physician. The patient developed no complications during the procedure. IMPRESSIONS  1. Left ventricular ejection fraction, by estimation, is 55 to 60%. The left ventricle has normal function.  2. Right ventricular systolic function is normal. The right ventricular size is normal.  3. No left atrial/left atrial appendage thrombus was detected.  4. The mitral valve is normal in structure. Mild mitral valve regurgitation.  5. There is a mobile mass attached to the tricuspid valve (clip 132) consistent with a vegetation given the current clinical context.. The tricuspid valve is degenerative.  6. The aortic valve  is tricuspid. Aortic valve regurgitation is not visualized. 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 FINDINGS  Left Ventricle: Left ventricular ejection fraction, by estimation, is 55 to 60%. The left ventricle has normal function. The left ventricular internal cavity size was normal in size. Right Ventricle: The right ventricular size is normal. No increase in right ventricular wall thickness. Right ventricular systolic function is normal. Left Atrium: Left atrial size was normal in size. No left atrial/left atrial appendage thrombus was detected. Right Atrium: Right atrial size was normal in size. Pericardium: There is no evidence of pericardial effusion. Mitral Valve: The mitral valve is normal in structure. Mild mitral valve regurgitation. Tricuspid Valve: There is a  mobile mass attached to the tricuspid valve (clip 132) consistent with a vegetation given the current clinical context. The tricuspid valve is degenerative in appearance. Tricuspid valve regurgitation is trivial. Aortic Valve: The aortic valve is tricuspid. Aortic valve regurgitation is not visualized. Pulmonic Valve: The pulmonic valve was normal in structure. Pulmonic valve regurgitation is not visualized. Aorta: The aortic root is normal in size and structure. IAS/Shunts: No atrial level shunt detected by color flow Doppler. Kate Sable MD Electronically signed by Kate Sable MD Signature Date/Time: 07/07/2021/5:55:00 PM    Final     Cardiac Studies   TEE 07/07/2021: 1. Left ventricular ejection fraction, by estimation, is 55 to 60%. The  left ventricle has normal function.   2. Right ventricular systolic function is normal. The right ventricular  size is normal.   3. No left atrial/left atrial appendage thrombus was detected.   4. The mitral valve is normal in structure. Mild mitral valve  regurgitation.   5. There is a mobile mass attached to the tricuspid valve (clip 132)  consistent with a vegetation given the current clinical context.. The  tricuspid valve is degenerative.   6. The aortic valve is tricuspid. Aortic valve regurgitation is not  visualized.   Conclusion(s)/Recommendation(s): Findings are concerning for  vegetation/infective endocarditis as detailed above.  __________   2D echo 07/04/2021:  1. Left ventricular ejection fraction, by estimation, is 55 to 60%. The  left ventricle has normal function. Left ventricular endocardial border  not optimally defined to evaluate regional wall motion. There is mild left  ventricular hypertrophy. Left  ventricular diastolic parameters are indeterminate.   2. Right ventricular systolic function is normal. The right ventricular  size is normal. Tricuspid regurgitation signal is inadequate for assessing  PA pressure.    3. Left atrial size was mildly dilated.   4. Right atrial size was mildly dilated.   5. The mitral valve is normal in structure. No evidence of mitral valve  regurgitation. No evidence of mitral stenosis.   6. The aortic valve is normal in structure. Aortic valve regurgitation is  not visualized. Mild aortic valve sclerosis is present, with no evidence  of aortic valve stenosis.   7. Aortic dilatation noted. There is borderline dilatation of the aortic  root and of the ascending aorta, measuring 38 mm.   8. The tricuspid was not well visualized. However, there is likely a  mobile vergetation noted on the short axis images. Recommend a TEE.  Patient Profile     60 y.o. 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  -- Evaluated by EP (while at [facility]) with no current indication for venous temp wire at this time, given good escape rhythm, particularly in the context of ongoing bacteremia  -- At the time of valve replacement/repair he will require a pacemaker; EP has  recommended cardiothoracic surgery place epicardial RA and RV leads at the time of valve replacement  -- If he decompensates prior, would need temp wire  -- Avoiding AV nodal blocking agents     Respiratory failure: Stable, weaned to RA with stable sats. -- CTA chest negative for PE with concern for multiple ill-defined lesions   Hyponatremia: Stable but remains low at 124. Suspected in the setting of ETOH use -- Evaluated by nephrology at [facility], will reach out to nephrology here -- 1200cc fluid restriction  -- started on lasix 20mg  daily   Thrombocytopenia: Stable, 57>>78>>84 -- evaluated by oncology  -- Suspected to be in the setting of alcohol use   Alcohol use: -- Cessation advised    DM: Newly diagnosed  with A1c 7.8 --Evaluated by diabetic coordinator at [facility] --Needs outpatient follow up with PCP   Arthralgias: -- Evaluated by orthopedics -- Minimal concern for periprosthetic hip infection without need for aspiration  -- Continue previously ordered PRN oxycodone and morphine   Consults:  Oncology, ID, Ortho, EP, Nephrology   For questions or updates, please contact [place] Please consult [email or web address] for contact info under   Signed, Reino Bellis, NP  07/09/2021, 9:55 AM    Patient seen, examined. Available data reviewed. Agree with findings, assessment, and plan as outlined by Reino Bellis, NP.  Patient is doing okay except for feeling sore all over.  He denies chest pain or shortness of breath.  Extensive data is reviewed from his hospitalization at [facility].  I personally reviewed his TEE images.  On my exam today, he is alert, oriented, in no distress.  HEENT is pertinent for poor dentition especially in his lower teeth with multiple missing teeth.  Lungs are diminished in the bases but otherwise clear.  Heart is regular rate and rhythm with no murmur or gallop.  Abdomen is soft and nontender, extremities have trace ankle edema and there is hand/finger edema noted.  TEE is pertinent for normal LV and RV function with a tricuspid valve vegetation noted.  There is not a lot of TR present.  The patient's telemetry shows periods of second and  third-degree heart block with a stable escape rhythm.  Cardiac surgery to evaluate the patient to further consider treatment options.  I will discuss with our EP team about whether they feel tricuspid valve endocarditis has caused this patient's high-grade heart block.  I am not sure whether angio [facility] treatment is indicated and will review this with the cardiac surgical team.  In addition, we have contacted infectious disease and appreciate their consultation.  We have also contacted nephrology to help [place] manage his hyponatremia.  Sherren Mocha, M.D. 07/09/2021 12:16 PM

## 2021-07-10 LAB — CULTURE, BLOOD (ROUTINE X 2)
Culture: NO GROWTH
Culture: NO GROWTH
Special Requests: ADEQUATE
Special Requests: ADEQUATE

## 2021-07-10 LAB — RENAL FUNCTION PANEL
Albumin: <1.5 g/dL — ABNORMAL LOW (ref 3.5–5.0)
Albumin: <1.5 g/dL — ABNORMAL LOW (ref 3.5–5.0)
Anion gap: 4 — ABNORMAL LOW (ref 5–15)
Anion gap: 8 (ref 5–15)
BUN: 14 mg/dL (ref 6–20)
BUN: 14 mg/dL (ref 6–20)
CO2: 26 mmol/L (ref 22–32)
CO2: 23 mmol/L (ref 22–32)
Calcium: 7.1 mg/dL — ABNORMAL LOW (ref 8.9–10.3)
Calcium: 7.3 mg/dL — ABNORMAL LOW (ref 8.9–10.3)
Chloride: 94 mmol/L — ABNORMAL LOW (ref 98–111)
Chloride: 94 mmol/L — ABNORMAL LOW (ref 98–111)
Creatinine, Ser: 0.9 mg/dL (ref 0.61–1.24)
Creatinine, Ser: 1.01 mg/dL (ref 0.61–1.24)
GFR, Estimated: >60 mL/min (ref >60)
GFR, Estimated: >60 mL/min (ref >60)
Glucose, Bld: 137 mg/dL — ABNORMAL HIGH (ref 70–99)
Glucose, Bld: 213 mg/dL — ABNORMAL HIGH (ref 70–99)
Phosphorus: 3.1 mg/dL (ref 2.5–4.6)
Phosphorus: 3.2 mg/dL (ref 2.5–4.6)
Potassium: 4 mmol/L (ref 3.5–5.1)
Potassium: 4.2 mmol/L (ref 3.5–5.1)
Sodium: 124 mmol/L — ABNORMAL LOW (ref 135–145)
Sodium: 125 mmol/L — ABNORMAL LOW (ref 135–145)

## 2021-07-10 LAB — GLUCOSE, CAPILLARY
Glucose-Capillary: 134 mg/dL — ABNORMAL HIGH (ref 70–99)
Glucose-Capillary: 202 mg/dL — ABNORMAL HIGH (ref 70–99)
Glucose-Capillary: 198 mg/dL — ABNORMAL HIGH (ref 70–99)

## 2021-07-10 NOTE — Progress Notes (Signed)
[facility] Progress Note   Subjective:   Feeling better.  Ate a good breakfast.  No HA or blurred vision.  No h/o cirrhosis but did have decades of heavy EtOH, just weekends now  Objective Vitals:   07/09/21 2134 07/09/21 2304 07/09/21 2306 07/10/21 0338  BP:   (!) 146/62 (!) 160/69  Pulse:   80 86  Resp: 18 18 18 18   Temp:   99.6 F (37.6 C) 99.7 F (37.6 C)  TempSrc: Oral Oral Oral Oral  SpO2:  96% 97% 96%  Weight:    110.4 kg  Height:       Physical Exam Gen: appears comfortable sitting up in bed Eyes: anicteric, EOMI ENT: MM dry Neck: supple, no JVD CV:  RRR, no murmur appreciated Abd: soft, obese Lungs: clear, normal WOB RA;  I don't see stigmata of cirrhosis GU: no foley Extr:  no edema Neuro: nonfocal  Additional Objective Labs: Basic Metabolic Panel: Recent Labs  Lab 07/03/21 1655 07/03/21 2301 07/08/21 0628 07/08/21 1747 07/09/21 1319 07/10/21 0123  NA 124*   < > 124*  --  120* 124*  K 3.4*   < > 3.7  --  4.6 4.0  CL 85*   < > 91*  --  88* 94*  CO2 27   < > 27  --  23 26  GLUCOSE 267*   < > 194*  --  225* 137*  BUN 31*   < > 21*  --  16 14  CREATININE 1.19   < > 0.89 0.96 0.89 0.90  CALCIUM 8.1*   < > 7.2*  --  7.2* 7.1*  PHOS 3.4  --   --   --  3.8 3.1   < > = values in this interval not displayed.   Liver Function Tests: Recent Labs  Lab 07/07/21 1213 07/09/21 1319 07/10/21 0123  AST 27  --   --   ALT 16  --   --   ALKPHOS 64  --   --   BILITOT 1.4*  --   --   PROT 6.8  --   --   ALBUMIN 1.9* 1.6* <1.5*   No results for input(s): LIPASE, AMYLASE in the last 168 hours. CBC: Recent Labs  Lab 07/05/21 0747 07/06/21 0814 07/07/21 0445 07/08/21 0628 07/08/21 1747  WBC 15.9* 13.8* 14.4* 10.2 9.0  NEUTROABS 13.3* 11.5* 11.9* 8.3*  --   HGB 12.6* 12.6* 11.9* 10.7* 10.7*  HCT 34.6* 35.2* 32.4* 30.2* 31.7*  MCV 94.0 93.4 92.3 93.2 95.5  PLT 71* 57* 78* 84* 80*   Blood Culture    Component Value Date/Time   SDES  BLOOD RIGHT WRIST 07/05/2021 0747   SDES BLOOD BLOOD RIGHT HAND 07/05/2021 0747   SPECREQUEST  07/05/2021 0747    BOTTLES DRAWN AEROBIC AND ANAEROBIC Blood Culture adequate volume   SPECREQUEST  07/05/2021 0747    BOTTLES DRAWN AEROBIC ONLY Blood Culture adequate volume   CULT  07/05/2021 0747    NO GROWTH 5 DAYS Performed at [facility], [address] [place], [place] [address]    CULT  07/05/2021 0747    NO GROWTH 5 DAYS Performed at [facility], [address] [place], [place] [address]    REPTSTATUS 07/10/2021 FINAL 07/05/2021 0747   REPTSTATUS 07/10/2021 FINAL 07/05/2021 0747    Cardiac Enzymes: No results for input(s): CKTOTAL, CKMB, CKMBINDEX, TROPONINI in the last 168 hours. CBG: Recent Labs  Lab 07/09/21 [phone] 07/09/21  1214 07/09/21 1618 07/09/21 2133 07/10/21 0634  GLUCAP 147* 237* 223* 225* 134*   Iron Studies: No results for input(s): IRON, TIBC, TRANSFERRIN, FERRITIN in the last 72 hours. @lablastinr3 @ Studies/Results: DG Orthopantogram  Result Date: 07/09/2021 CLINICAL DATA:  Pre cardiac surgery. EXAM: ORTHOPANTOGRAM/PANORAMIC COMPARISON:  None. FINDINGS: No fracture or lytic destruction is noted. Extensive dental work is noted. Several left posterior mandibular molars are absent. IMPRESSION: No fracture or lytic destruction is noted. Electronically Signed   By: Marijo Conception M.D.   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  ID  following.  Panorex pending.  At [facility] for CTS consult.    **Hyponatremia:  euvolemic hyponatremia it appeared at admission which was suspected EtOH/beer related. TSH sl low not hypothyroidism, BP ok and no electrolyte/bicarb issues to suggest adrenal insufficiency. No e/o paraproteinemia. In the setting of fluid restriction and diuretic use his serum sodium worsened to 120 and he appeared hypovolemic.  Stopped fluid restriction, lasix 10/28 and serum sodium improved to 124 today.  Cont current care.   No indication for hypertonic saline currently.   Will follow closely - Labs this afternoon and again in AM.  On Strict I/Os, daily wts.    **Anemia: mild in 10s, monitor.    **EtOH overuse: thiamine/folate.     **DM: new dx, A1c was 7.8. Per primary.    **thrombocytopenia: Plt in 70-80s.  Heme/onc has been consulted.  SFLC normal.     Will follow, call with concerns.   Jannifer Hick MD 07/10/2021, 10:07 AM  [facility] Pager: [phone]

## 2021-07-10 NOTE — Progress Notes (Signed)
Progress Note  Patient Name: Joshua Cole Date of Encounter: 07/10/2021  [facility] Cardiologist: Kathlyn Sacramento, MD   Subjective   Feeling good with no complaints  Inpatient Medications    Scheduled Meds:  aspirin EC  81 mg Oral Daily   folic acid  1 mg Oral Daily   heparin  5,000 Units Subcutaneous Q8H   insulin aspart  0-15 Units Subcutaneous TID WC   insulin aspart  0-5 Units Subcutaneous QHS   insulin glargine-yfgn  23 Units Subcutaneous QHS   multivitamin  1 tablet Oral Daily   pantoprazole  40 mg Oral Daily   thiamine  100 mg Oral Daily   Continuous Infusions:  sodium chloride 20 mL/hr at 07/09/21 1529    ceFAZolin (ANCEF) IV 2 g (07/10/21 0652)   PRN Meds: acetaminophen, LORazepam, morphine injection, nitroGLYCERIN, ondansetron (ZOFRAN) IV, oxyCODONE   Vital Signs    Vitals:   07/09/21 2134 07/09/21 2304 07/09/21 2306 07/10/21 0338  BP:   (!) 146/62 (!) 160/69  Pulse:   80 86  Resp: 18 18 18 18   Temp:   99.6 F (37.6 C) 99.7 F (37.6 C)  TempSrc: Oral Oral Oral Oral  SpO2:  96% 97% 96%  Weight:    110.4 kg  Height:        Intake/Output Summary (Last 24 hours) at 07/10/2021 0944 Last data filed at 07/09/2021 2200 Gross per 24 hour  Intake 720 ml  Output 1925 ml  Net -1205 ml    Last 3 Weights 07/10/2021 07/08/2021 07/03/2021  Weight (lbs) 243 lb 6.2 oz 244 lb 14.9 oz 249 lb 4.8 oz  Weight (kg) 110.4 kg 111.1 kg 113.082 kg      Telemetry    NSR with high grade AVB - Personally Reviewed  ECG    NSR with high grade AVB with HR in the 80's- Personally Reviewed  Physical Exam   GEN: Well nourished, well developed in no acute distress HEENT: Normal NECK: No JVD; No carotid bruits LYMPHATICS: No lymphadenopathy CARDIAC:RRR, no murmurs, rubs, gallops RESPIRATORY:  Clear to auscultation without rales, wheezing or rhonchi  ABDOMEN: Soft, non-tender, non-distended MUSCULOSKELETAL:  No edema; No deformity  SKIN: Warm and  dry NEUROLOGIC:  Alert and oriented x 3 PSYCHIATRIC:  Normal affect   Labs    High Sensitivity Troponin:  No results for input(s): TROPONINIHS in the last 720 hours.   Chemistry Recent Labs  Lab 07/03/21 1655 07/03/21 2301 07/07/21 1213 07/08/21 0628 07/08/21 1747 07/09/21 1319 07/10/21 0123  NA 124*   < >  --  124*  --  120* 124*  K 3.4*   < >  --  3.7  --  4.6 4.0  CL 85*   < >  --  91*  --  88* 94*  CO2 27   < >  --  27  --  23 26  GLUCOSE 267*   < >  --  194*  --  225* 137*  BUN 31*   < >  --  21*  --  16 14  CREATININE 1.19   < >  --  0.89 0.96 0.89 0.90  CALCIUM 8.1*   < >  --  7.2*  --  7.2* 7.1*  MG 2.2  --   --   --   --   --   --   PROT  --   --  6.8  --   --   --   --  ALBUMIN  --   --  1.9*  --   --  1.6* <1.5*  AST  --   --  27  --   --   --   --   ALT  --   --  16  --   --   --   --   ALKPHOS  --   --  64  --   --   --   --   BILITOT  --   --  1.4*  --   --   --   --   GFRNONAA >60   < >  --  >60 >60 >60 >60  ANIONGAP 12   < >  --  6  --  9 4*   < > = values in this interval not displayed.     Lipids  Recent Labs  Lab 07/08/21 0628  CHOL 96  TRIG 102  HDL <10*  LDLCALC NOT CALCULATED  CHOLHDL NOT CALCULATED     Hematology Recent Labs  Lab 07/07/21 0445 07/08/21 0628 07/08/21 1747  WBC 14.4* 10.2 9.0  RBC 3.51* 3.24* 3.32*  HGB 11.9* 10.7* 10.7*  HCT 32.4* 30.2* 31.7*  MCV 92.3 93.2 95.5  MCH 33.9 33.0 32.2  MCHC 36.7* 35.4 33.8  RDW 12.5 12.8 12.8  PLT 78* 84* 80*    Thyroid  Recent Labs  Lab 07/07/21 1213  TSH 0.162*     BNP Recent Labs  Lab 07/05/21 0747  BNP 219.6*     DDimer  Recent Labs  Lab 07/04/21 0506  DDIMER 3.78*      Radiology    DG Orthopantogram  Result Date: 07/09/2021 CLINICAL DATA:  Pre cardiac surgery. EXAM: ORTHOPANTOGRAM/PANORAMIC COMPARISON:  None. FINDINGS: No fracture or lytic destruction is noted. Extensive dental work is noted. Several left posterior mandibular molars are absent.  IMPRESSION: No fracture or lytic destruction is noted. Electronically Signed   By: Marijo Conception M.D.   On: 07/09/2021 15:02    Cardiac Studies   TEE 07/07/2021: 1. Left ventricular ejection fraction, by estimation, is 55 to 60%. The  left ventricle has normal function.   2. Right ventricular systolic function is normal. The right ventricular  size is normal.   3. No left atrial/left atrial appendage thrombus was detected.   4. The mitral valve is normal in structure. Mild mitral valve  regurgitation.   5. There is a mobile mass attached to the tricuspid valve (clip 132)  consistent with a vegetation given the current clinical context.. The  tricuspid valve is degenerative.   6. The aortic valve is tricuspid. Aortic valve regurgitation is not  visualized.   Conclusion(s)/Recommendation(s): Findings are concerning for  vegetation/infective endocarditis as detailed above.  __________   2D echo 07/04/2021:  1. Left ventricular ejection fraction, by estimation, is 55 to 60%. The  left ventricle has normal function. Left ventricular endocardial border  not optimally defined to evaluate regional wall motion. There is mild left  ventricular hypertrophy. Left  ventricular diastolic parameters are indeterminate.   2. Right ventricular systolic function is normal. The right ventricular  size is normal. Tricuspid regurgitation signal is inadequate for assessing  PA pressure.   3. Left atrial size was mildly dilated.   4. Right atrial size was mildly dilated.   5. The mitral valve is normal in structure. No evidence of mitral valve  regurgitation. No evidence of mitral stenosis.   6. The aortic valve is normal in structure.  Aortic valve regurgitation is  not visualized. Mild aortic valve sclerosis is present, with no evidence  of aortic valve stenosis.   7. Aortic dilatation noted. There is borderline dilatation of the aortic  root and of the ascending aorta, measuring 38 mm.   8. The  tricuspid was not well visualized. However, there is likely a  mobile vergetation noted on the short axis images. Recommend a TEE.  Patient Profile     60 y.o. male  with gout, alcohol use, bilateral THA, and poor dental hygiene who was admitted to [facility] on 07/03/2021 with MSSA bacteremia and found to have a tricuspid valve vegetation complicated by complete heart block with good escape rhythm who is being seen 07/08/2021 for the evaluation of the above.  Assessment & Plan    MSSA bacteremia with tricuspid valve vegetation with acute metabolic encephalopathy: -- IV Ancef per ID, (notified [facility] team regarding transfer) -- Mental status improved, suspected to be in the setting of his bacteremia  -- Transferred to [facility] for cardiothoracic surgery evaluation of valve replacement vs Angiovac, consult pending ([facility] is aware)  -- mandible panorex has been ordered   Complete heart block: In the setting of MSSA bacteremia with tricuspid valve vegetation.  -- Evaluated by EP (while at [facility]) with no current indication for venous temp wire at this time, given good escape rhythm, particularly in the context of ongoing bacteremia  -- At the time of valve replacement/repair he will require a pacemaker; EP has recommended cardiothoracic surgery place epicardial RA and RV leads at the time of valve replacement  -- If he decompensates prior, would need temp wire  -- Avoiding AV nodal blocking agents  --No change in rhythm on telemetry   Respiratory failure: Stable, weaned to RA with stable sats. -- CTA chest negative for PE with concern for multiple ill-defined lesions   Hyponatremia: Stable but remains low but improved from 1 20-1 24 today suspected in the setting of ETOH use -- Evaluated by nephrology at [facility] and nephrology following here -- Felt to be hypovolemic and fluid restriction and diuretics have been stopped   Thrombocytopenia: Stable, 57>>78>>84>>80 -- evaluated by oncology  -- Suspected  to be in the setting of alcohol use   Alcohol use: -- Cessation advised    DM: Newly diagnosed  with A1c 7.8 --Evaluated by diabetic coordinator at [facility] --Needs outpatient follow up with PCP   Arthralgias: -- Evaluated by orthopedics -- Minimal concern for periprosthetic hip infection without need for aspiration  -- Continue previously ordered PRN oxycodone and morphine   Consults:  Oncology, ID, Ortho, EP, Nephrology   I have spent a total of 35 minutes with patient reviewing 2D echo, EP note , telemetry, EKGs, labs and examining patient as well as establishing an assessment and plan that was discussed with the patient.  > 50% of time was spent in direct patient care.     For questions or updates, please contact [place] Please consult [email or web address] for contact info under   Signed, Fransico Him, MD  07/10/2021, 9:44 AM    Patient seen, examined. Available data reviewed. Agree with findings, assessment, and plan as outlined by Reino Bellis, NP.  Patient is doing okay except for feeling sore all over.  He denies chest pain or shortness of breath.  Extensive data is reviewed from his hospitalization at [facility].  I personally reviewed his TEE images.  On my exam today, he is alert, oriented, in no distress.  HEENT  is pertinent for poor dentition especially in his lower teeth with multiple missing teeth.  Lungs are diminished in the bases but otherwise clear.  Heart is regular rate and rhythm with no murmur or gallop.  Abdomen is soft and nontender, extremities have trace ankle edema and there is hand/finger edema noted.  TEE is pertinent for normal LV and RV function with a tricuspid valve vegetation noted.  There is not a lot of TR present.  The patient's telemetry shows periods of second and third-degree heart block with a stable escape rhythm.  Cardiac surgery to evaluate the patient to further consider treatment options.  I will discuss with our EP team about whether they feel  tricuspid valve endocarditis has caused this patient's high-grade heart block.  I am not sure whether angio [facility] treatment is indicated and will review this with the cardiac surgical team.  In addition, we have contacted infectious disease and appreciate their consultation.  We have also contacted nephrology to help [place] manage his hyponatremia.  Sherren Mocha, M.D. 07/10/2021 9:44 AM

## 2021-07-10 NOTE — Progress Notes (Signed)
     [address]       [place] [address]             [phone]       Full consult note to follow. 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  I personally reviewed the transesophageal echocardiogram.  Tricuspid valve vegetation is quite small, and he does not have significant tricuspid valve regurgitation.   On transesophageal echocardiogram, I did not see any annular abscess along the tricuspid valve which could be leading to his complete heart block.  In regards to the question of angio VAC debridement, this vegetation is small and likely will resolve with antibiotic therapy.  In regards to valve replacement, he has minimal tricuspid valve regurgitation, it also appears as though he does not have source control given that he has osteomyelitis and potentially infected hip prosthesis on MRI.   Recommendation: No need for angio vac debridement for tricuspid valve replacement at this point.   Tricuspid valve replacement is not recommended in the setting of an infected hip prosthetic, and osteomyelitis. Continue medical therapy for now.

## 2021-07-11 ENCOUNTER — Inpatient Hospital Stay (HOSPITAL_COMMUNITY): Payer: Self-pay

## 2021-07-11 DIAGNOSIS — B9561 Methicillin susceptible Staphylococcus aureus infection as the cause of diseases classified elsewhere: Secondary | ICD-10-CM

## 2021-07-11 DIAGNOSIS — M25512 Pain in left shoulder: Secondary | ICD-10-CM

## 2021-07-11 DIAGNOSIS — M7989 Other specified soft tissue disorders: Secondary | ICD-10-CM

## 2021-07-11 LAB — GLUCOSE, CAPILLARY
Glucose-Capillary: 124 mg/dL — ABNORMAL HIGH (ref 70–99)
Glucose-Capillary: 280 mg/dL — ABNORMAL HIGH (ref 70–99)
Glucose-Capillary: 198 mg/dL — ABNORMAL HIGH (ref 70–99)
Glucose-Capillary: 224 mg/dL — ABNORMAL HIGH (ref 70–99)

## 2021-07-11 LAB — RENAL FUNCTION PANEL
Albumin: <1.5 g/dL — ABNORMAL LOW (ref 3.5–5.0)
Albumin: <1.5 g/dL — ABNORMAL LOW (ref 3.5–5.0)
Anion gap: 6 (ref 5–15)
Anion gap: 6 (ref 5–15)
BUN: 11 mg/dL (ref 6–20)
BUN: 14 mg/dL (ref 6–20)
CO2: 25 mmol/L (ref 22–32)
CO2: 24 mmol/L (ref 22–32)
Calcium: 7.2 mg/dL — ABNORMAL LOW (ref 8.9–10.3)
Calcium: 7.1 mg/dL — ABNORMAL LOW (ref 8.9–10.3)
Chloride: 92 mmol/L — ABNORMAL LOW (ref 98–111)
Chloride: 93 mmol/L — ABNORMAL LOW (ref 98–111)
Creatinine, Ser: 0.8 mg/dL (ref 0.61–1.24)
Creatinine, Ser: 1.02 mg/dL (ref 0.61–1.24)
GFR, Estimated: >60 mL/min (ref >60)
GFR, Estimated: >60 mL/min (ref >60)
Glucose, Bld: 135 mg/dL — ABNORMAL HIGH (ref 70–99)
Glucose, Bld: 251 mg/dL — ABNORMAL HIGH (ref 70–99)
Phosphorus: 3.2 mg/dL (ref 2.5–4.6)
Phosphorus: 3.4 mg/dL (ref 2.5–4.6)
Potassium: 4.3 mmol/L (ref 3.5–5.1)
Potassium: 4.5 mmol/L (ref 3.5–5.1)
Sodium: 123 mmol/L — ABNORMAL LOW (ref 135–145)
Sodium: 123 mmol/L — ABNORMAL LOW (ref 135–145)

## 2021-07-11 LAB — OSMOLALITY, URINE

## 2021-07-11 MED ORDER — FUROSEMIDE 20 MG PO TABS
20.0000 mg | ORAL_TABLET | Freq: Every day | ORAL | Status: DC
  Administered 2021-07-11: 20 mg via ORAL
  Filled 2021-07-11: qty 1

## 2021-07-11 NOTE — Progress Notes (Signed)
OT Cancellation Note  Patient Details Name: Joshua Cole MRN: [id number] DOB: [birth year]   Cancelled Treatment:    Reason Eval/Treat Not Completed: Medical issues which prohibited therapy. Pt has had ultra sound of LUE for possible DVT, but results are not back as yet. Looks like heparin level was last checked 10/22 and was .136 which would not be therapeutic as of that date. Pt has been getting heparin since then just I do not see any results since 10/22.  [place], OTR/L Acute Rehab Services Pager [phone] Office [phone]    Almon Register 07/11/2021, 2:50 PM

## 2021-07-11 NOTE — TOC Initial Note (Signed)
Transition of Care [facility]) - Initial/Assessment Note    Patient Details  Name: Joshua Cole MRN: [id number] Date of Birth: 15-Apr-1961  Transition of Care [facility] CM/SW Contact:    Bartholomew Crews, RN Phone Number: [phone] 07/11/2021, 4:58 PM  Clinical Narrative:                  Spoke with patient at the bedside to discuss transition planning. Demographics verified. PTA home with fiance. Stated his daughter lives a couple miles from him. Has a walker at home, but was independent and working prior to hospitalization.   Referral to financial counselor.   Referral to [facility] to follow for long term IV antibiotics.   Follow for [facility] charity needs.   Follow for [facility] needs for discharge medications.   Stated that he will have transportation home at discharge.   TOC following for transition needs.   Expected Discharge Plan: [place] Barriers to Discharge: Continued Medical Work up   Patient Goals and CMS Choice Patient states their goals for this hospitalization and ongoing recovery are:: return home with fiance CMS Medicare.gov Compare Post Acute Care list provided to:: Patient Choice offered to / list presented to : Patient  Expected Discharge Plan and Services Expected Discharge Plan: [place] In-house Referral: Financial Counselor Discharge Planning Services: CM Consult Post Acute Care Choice: [place] arrangements for the past 2 months: [place]: [facility] Date [place]: 07/11/21 Time [place]: 1657 Representative spoke with at [place]: [place] Arrangements/Services Living arrangements for the past 2 months: [place] with:: Self, Significant Other Patient language and need for interpreter reviewed:: Yes Do you feel safe going back to the place where you live?: Yes      Need for Family Participation in Patient Care:  Yes (Comment) Care giver support system in place?: Yes (comment) Current home services: DME (walker) Criminal Activity/Legal Involvement Pertinent to Current Situation/Hospitalization: No - Comment as needed  Activities of Daily Living      Permission Sought/Granted                  Emotional Assessment Appearance:: Appears stated age Attitude/Demeanor/Rapport: Engaged Affect (typically observed): Accepting Orientation: : Oriented to Self, Oriented to Place, Oriented to  Time, Oriented to Situation Alcohol / Substance Use: Not Applicable Psych Involvement: No (comment)  Admission diagnosis:  MSSA bacteremia [R78.81, B95.61] Patient Active Problem List   Diagnosis Date Noted   MSSA bacteremia 07/08/2021   Acute bacterial endocarditis    Heart block    Bacteremia    Hyponatremia 67/67/2094   Acute metabolic encephalopathy 70/96/2836   Joint pain 07/03/2021   Generalized weakness 07/03/2021   SIRS (systemic inflammatory response syndrome) ([place]) 07/03/2021   Hypokalemia 07/03/2021   Hyperglycemia 07/03/2021   Thrombocytopenia ([place]) 07/03/2021   Nausea vomiting and diarrhea 07/03/2021   Gout    PCP:  Pcp, No Pharmacy:  No Pharmacies Listed    Social Determinants of Health (SDOH) Interventions    Readmission Risk Interventions No flowsheet data found.

## 2021-07-11 NOTE — Progress Notes (Signed)
Progress Note  Patient Name: Governor Matos Date of Encounter: 07/11/2021  Primary Cardiologist: Kathlyn Sacramento, MD   Subjective   "My hip hurts."  Inpatient Medications    Scheduled Meds:  aspirin EC  81 mg Oral Daily   folic acid  1 mg Oral Daily   heparin  5,000 Units Subcutaneous Q8H   insulin aspart  0-15 Units Subcutaneous TID WC   insulin aspart  0-5 Units Subcutaneous QHS   insulin glargine-yfgn  23 Units Subcutaneous QHS   multivitamin  1 tablet Oral Daily   pantoprazole  40 mg Oral Daily   thiamine  100 mg Oral Daily   Continuous Infusions:  sodium chloride 20 mL/hr at 07/09/21 1529    ceFAZolin (ANCEF) IV 2 g (07/11/21 0647)   PRN Meds: acetaminophen, LORazepam, morphine injection, nitroGLYCERIN, ondansetron (ZOFRAN) IV, oxyCODONE   Vital Signs    Vitals:   07/11/21 0127 07/11/21 0252 07/11/21 0350 07/11/21 0752  BP: 139/68  129/62 131/65  Pulse: 85  82 89  Resp: 18  17 16   Temp: (!) 101.5 F (38.6 C) 98.5 F (36.9 C) 98.6 F (37 C) 98 F (36.7 C)  TempSrc: Oral  Oral Oral  SpO2: 96%  93% 94%  Weight:   112.9 kg   Height:        Intake/Output Summary (Last 24 hours) at 07/11/2021 1004 Last data filed at 07/11/2021 0300 Gross per 24 hour  Intake 360 ml  Output 1000 ml  Net -640 ml   Filed Weights   07/08/21 1705 07/10/21 0338 07/11/21 0350  Weight: 111.1 kg 110.4 kg 112.9 kg    Telemetry    Nsr with AVWB - Personally Reviewed  ECG    none - Personally Reviewed  Physical Exam   GEN: No acute distress.   Neck: No JVD Cardiac: RRR, no murmurs, rubs, or gallops.  Respiratory: Clear to auscultation bilaterally. GI: Soft, nontender, non-distended  MS: left arm is swollen; No deformity. Neuro:  Nonfocal  Psych: Normal affect   Labs    Chemistry Recent Labs  Lab 07/07/21 1213 07/08/21 0628 07/10/21 0123 07/10/21 1523 07/11/21 0419  NA  --    < > 124* 125* 123*  K  --    < > 4.0 4.2 4.3  CL  --    < > 94* 94* 92*  CO2   --    < > 26 23 25   GLUCOSE  --    < > 137* 213* 135*  BUN  --    < > 14 14 11   CREATININE  --    < > 0.90 1.01 0.80  CALCIUM  --    < > 7.1* 7.3* 7.2*  PROT 6.8  --   --   --   --   ALBUMIN 1.9*   < > <1.5* <1.5* <1.5*  AST 27  --   --   --   --   ALT 16  --   --   --   --   ALKPHOS 64  --   --   --   --   BILITOT 1.4*  --   --   --   --   GFRNONAA  --    < > >60 >60 >60  ANIONGAP  --    < > 4* 8 6   < > = values in this interval not displayed.     Hematology Recent Labs  Lab 07/07/21 0445 07/08/21 2836 07/08/21 1747  WBC 14.4* 10.2 9.0  RBC 3.51* 3.24* 3.32*  HGB 11.9* 10.7* 10.7*  HCT 32.4* 30.2* 31.7*  MCV 92.3 93.2 95.5  MCH 33.9 33.0 32.2  MCHC 36.7* 35.4 33.8  RDW 12.5 12.8 12.8  PLT 78* 84* 80*    Cardiac EnzymesNo results for input(s): TROPONINI in the last 168 hours. No results for input(s): TROPIPOC in the last 168 hours.   BNP Recent Labs  Lab 07/05/21 0747  BNP 219.6*     DDimer No results for input(s): DDIMER in the last 168 hours.   Radiology    DG Orthopantogram  Result Date: 07/09/2021 CLINICAL DATA:  Pre cardiac surgery. EXAM: ORTHOPANTOGRAM/PANORAMIC COMPARISON:  None. FINDINGS: No fracture or lytic destruction is noted. Extensive dental work is noted. Several left posterior mandibular molars are absent. IMPRESSION: No fracture or lytic destruction is noted. Electronically Signed   By: Marijo Conception M.D.   On: 07/09/2021 15:02    Cardiac Studies   2D echo reviewed  Patient Profile     60 y.o. male admitted in transfer for SBE, AV block, and osteo of the hip.   Assessment & Plan    MSSA TV SBE - Dr. Kipp Brood has seen the patient and plan is for long term anti-biotics in hopes of infection cure. He will need a PICC line this coming week and I suspect long term anti-biotics. Heart block - looks like AVWB to me. There was no abscess seen on TEE. No indication for pacing at this time. Osteo of the hip - continue IV  anti-biotics. Thrombocytopenia - will follow. No bleeding.  Left arm swelling - I suspect a DVT. Will check left arm u/s.  For questions or updates, please contact [place] Please consult [email or web address] for contact info under Cardiology/STEMI.      Signed, Cristopher Peru, MD  07/11/2021, 10:04 AM

## 2021-07-11 NOTE — Progress Notes (Addendum)
[facility] Progress Note   Subjective:   I/Os yest 360 / 1000.  CT surgery - no surgery, IV abx, PICC requested.  The 372mL in is not accurate but it doesn't seem that he's drinking copious fluids.  Objective Vitals:   07/11/21 0127 07/11/21 0252 07/11/21 0350 07/11/21 0752  BP: 139/68  129/62 131/65  Pulse: 85  82 89  Resp: 18  17 16   Temp: (!) 101.5 F (38.6 C) 98.5 F (36.9 C) 98.6 F (37 C) 98 F (36.7 C)  TempSrc: Oral  Oral Oral  SpO2: 96%  93% 94%  Weight:   112.9 kg   Height:       Physical Exam Gen: appears comfortable sitting up in bed Eyes: anicteric, EOMI ENT: MM  Neck: supple, no JVD CV:  RRR, no murmur appreciated Abd: soft, obese Lungs: clear, normal WOB RA;  I don't see stigmata of cirrhosis GU: no foley Extr:  1+ pedal and tibial edema - new Neuro: nonfocal  Additional Objective Labs: Basic Metabolic Panel: Recent Labs  Lab 07/10/21 0123 07/10/21 1523 07/11/21 0419  NA 124* 125* 123*  K 4.0 4.2 4.3  CL 94* 94* 92*  CO2 26 23 25   GLUCOSE 137* 213* 135*  BUN 14 14 11   CREATININE 0.90 1.01 0.80  CALCIUM 7.1* 7.3* 7.2*  PHOS 3.1 3.2 3.2    Liver Function Tests: Recent Labs  Lab 07/07/21 1213 07/09/21 1319 07/10/21 0123 07/10/21 1523 07/11/21 0419  AST 27  --   --   --   --   ALT 16  --   --   --   --   ALKPHOS 64  --   --   --   --   BILITOT 1.4*  --   --   --   --   PROT 6.8  --   --   --   --   ALBUMIN 1.9*   < > <1.5* <1.5* <1.5*   < > = values in this interval not displayed.    No results for input(s): LIPASE, AMYLASE in the last 168 hours. CBC: Recent Labs  Lab 07/05/21 0747 07/06/21 0814 07/07/21 0445 07/08/21 0628 07/08/21 1747  WBC 15.9* 13.8* 14.4* 10.2 9.0  NEUTROABS 13.3* 11.5* 11.9* 8.3*  --   HGB 12.6* 12.6* 11.9* 10.7* 10.7*  HCT 34.6* 35.2* 32.4* 30.2* 31.7*  MCV 94.0 93.4 92.3 93.2 95.5  PLT 71* 57* 78* 84* 80*    Blood Culture    Component Value Date/Time   SDES BLOOD RIGHT WRIST  07/05/2021 0747   SDES BLOOD BLOOD RIGHT HAND 07/05/2021 0747   SPECREQUEST  07/05/2021 0747    BOTTLES DRAWN AEROBIC AND ANAEROBIC Blood Culture adequate volume   SPECREQUEST  07/05/2021 0747    BOTTLES DRAWN AEROBIC ONLY Blood Culture adequate volume   CULT  07/05/2021 0747    NO GROWTH 5 DAYS Performed at [facility], [address] [place], [place] [address]    CULT  07/05/2021 0747    NO GROWTH 5 DAYS Performed at [facility], [address] [place], [place] [address]    REPTSTATUS 07/10/2021 FINAL 07/05/2021 0747   REPTSTATUS 07/10/2021 FINAL 07/05/2021 0747    Cardiac Enzymes: No results for input(s): CKTOTAL, CKMB, CKMBINDEX, TROPONINI in the last 168 hours. CBG: Recent Labs  Lab 07/09/21 2133 07/10/21 0634 07/10/21 1405 07/10/21 2115 07/11/21 0635  GLUCAP 225* 134* 202* 198* 124*    Iron Studies: No results  for input(s): IRON, TIBC, TRANSFERRIN, FERRITIN in the last 72 hours. @lablastinr3 @ Studies/Results: DG Orthopantogram  Result Date: 07/09/2021 CLINICAL DATA:  Pre cardiac surgery. EXAM: ORTHOPANTOGRAM/PANORAMIC COMPARISON:  None. FINDINGS: No fracture or lytic destruction is noted. Extensive dental work is noted. Several left posterior mandibular molars are absent. IMPRESSION: No fracture or lytic destruction is noted. Electronically Signed   By: Marijo Conception M.D.   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  ID following.   Panorex ok.  At [facility] for CTS consult -- no surgery, IV abx should cure.    **Hyponatremia:  euvolemic hyponatremia it appeared at admission which was suspected EtOH/beer related. TSH sl low not hypothyroidism, BP ok and no electrolyte/bicarb issues to suggest adrenal insufficiency. No e/o paraproteinemia. In the setting of fluid restriction and diuretic use his serum sodium worsened to 120 and he appeared hypovolemic (U osm appear 434 appropriately).  Stopped fluid restriction and lasix 10/28 and serum sodium improved to 125 yesterday but then 123 today with new edema, wt up 2lbs (Ins are not accurate).  Will start lasix 20 daily back and limit sodium/fluids.  No indication for hypertonic saline currently.   Will follow closely - Labs this afternoon and again in AM.  On Strict I/Os, daily wts.    **Anemia: mild in 10s, monitor.    **EtOH overuse: thiamine/folate.     **DM: new dx, A1c was 7.8. Per primary.    **thrombocytopenia: Plt in 70-80s.  Heme/onc has been consulted.  SFLC normal.     Will follow, call with concerns.   Jannifer Hick MD 07/11/2021, 11:41 AM  [facility] Pager: [phone]

## 2021-07-11 NOTE — Progress Notes (Signed)
   07/11/21 0127  Vitals  Temp (!) 101.5 F (38.6 C)  Temp Source Oral  BP 139/68  MAP (mmHg) 89  BP Location Left Arm  BP Method Automatic  Patient Position (if appropriate) Lying  Pulse Rate 85  Pulse Rate Source Monitor  ECG Heart Rate 85  Resp 18   650 of tylenol given

## 2021-07-11 NOTE — Progress Notes (Signed)
VASCULAR LAB    Left upper extremity venous duplex has been performed.  See CV proc for preliminary results.   Adonai Selsor, RVT 07/11/2021, 1:09 PM

## 2021-07-11 NOTE — Evaluation (Signed)
Physical Therapy Evaluation Patient Details Name: Joshua Cole MRN: [id number] DOB: May 15, 1961 Today's Date: 07/11/2021  History of Present Illness  Pt is a 60 y/o M admitted on 07/03/21 with c/c of weakness, joint pain, confusion, N&V, diarrhea & fever. Pt received flu shot & covid booster on Friday. On Saturday night pt developed fever & chills. Pt was seen in Urgent care on 10/20 & tested negative for flu & covid but was started on tamiflu & steroid. Pt is currently being treated for hyponatremia & acute metabolic encephalopathy of unclear etiology. Brain MRI was ordered but was negative for acute processes. Blood cultures positive for Staph aureus. 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 Also developed complete heart block.Transferred to [facility] on 10/27. CTA chest negative for PE with concern for multiple ill-defined lesions. PMH: gout, alcohol use   Clinical Impression  Pt presented supine in bed with HOB elevated, awake and willing to participate in therapy session. Prior to admission, pt reported that he was independent with all functional mobility and ADLs. Pt lives with his fiance in a two level home with a few steps to enter. At the time of evaluation, pt limited with mobility secondary to generalized weakness and fatigue. He required mod A for bed mobility, mod A to stand from the EOB and min guard with RW to take a few pivotal steps from the bed to the chair. Pt was on RA throughout with SpO2 at 96-97% and HR stable. Pt would continue to benefit from skilled physical therapy services at this time while admitted and after d/c to address the below listed limitations in order to improve overall safety and independence with functional mobility.      Recommendations for follow up therapy are one component of a multi-disciplinary discharge planning process, led by the attending physician.  Recommendations may be updated based on patient status, additional functional criteria  and insurance authorization.  Follow Up Recommendations Skilled nursing-short term rehab (<3 hours/day)    Assistance Recommended at Discharge Frequent or constant Supervision/Assistance  Functional Status Assessment Patient has had a recent decline in their functional status and demonstrates the ability to make significant improvements in function in a reasonable and predictable amount of time.  Equipment Recommendations  None recommended by PT    Recommendations for Other Services       Precautions / Restrictions Precautions Precautions: Fall Restrictions Weight Bearing Restrictions: No      Mobility  Bed Mobility Overal bed mobility: Needs Assistance Bed Mobility: Supine to Sit     Supine to sit: Mod assist;HOB elevated     General bed mobility comments: increased time and effort needed, use of bed rails, assistance needed for bilateral LE movement off of bed, use of bed pad to pivot hips to achieve an upright sitting position at EOB    Transfers Overall transfer level: Needs assistance Equipment used: Rolling walker (2 wheels) Transfers: Sit to/from [facility] Sit to Stand: Mod assist Stand pivot transfers: Min guard         General transfer comment: cueing for safe hand placement and technique, heavy mod A needed to power into standing; pt steady once in standing and able to take pivotal steps to the chair towards his R side with PT providing CGA    Ambulation/Gait                Stairs            Wheelchair Mobility    Modified Rankin (Stroke  Patients Only)       Balance Overall balance assessment: Needs assistance Sitting-balance support: Feet supported Sitting balance-Leahy Scale: Fair     Standing balance support: Bilateral upper extremity supported Standing balance-Leahy Scale: Poor                               Pertinent Vitals/Pain Pain Assessment: No/denies pain    Home Living Family/patient  expects to be discharged to:: Private residence Living Arrangements: Spouse/significant other Available Help at Discharge: Family;Available PRN/intermittently Type of Home: House Home Access: Stairs to enter Entrance Stairs-Rails: Right;Left Entrance Stairs-Number of Steps: 3   Home Layout: Two level;Able to live on main level with bedroom/bathroom Home Equipment: Rolling Walker (2 wheels);Shower seat      Prior Function Prior Level of Function : Independent/Modified Independent                     Hand Dominance   Dominant Hand: Right    Extremity/Trunk Assessment   Upper Extremity Assessment Upper Extremity Assessment: Defer to OT evaluation;LUE deficits/detail LUE Deficits / Details: pt with decreased active ROM shoulder flexion (<30 degrees); no reports of pain with movement    Lower Extremity Assessment Lower Extremity Assessment: Generalized weakness       Communication   Communication: No difficulties  Cognition Arousal/Alertness: Awake/alert Behavior During Therapy: WFL for tasks assessed/performed Overall Cognitive Status: Within Functional Limits for tasks assessed                                 General Comments: cognition not formally assessed but [facility] for general conversation        General Comments      Exercises     Assessment/Plan    PT Assessment Patient needs continued PT services  PT Problem List Decreased strength;Decreased range of motion;Decreased activity tolerance;Decreased balance;Decreased mobility;Decreased knowledge of use of DME;Decreased safety awareness;Decreased knowledge of precautions;Cardiopulmonary status limiting activity       PT Treatment Interventions DME instruction;Gait training;Stair training;Functional mobility training;Therapeutic activities;Therapeutic exercise;Balance training;Neuromuscular re-education;Patient/family education    PT Goals (Current goals can be found in the Care Plan section)   Acute Rehab PT Goals Patient Stated Goal: to get stronger PT Goal Formulation: With patient Time For Goal Achievement: 07/25/21 Potential to Achieve Goals: Good    Frequency Min 2X/week   Barriers to discharge Decreased caregiver support      Co-evaluation               AM-PAC PT "6 Clicks" Mobility  Outcome Measure Help needed turning from your back to your side while in a flat bed without using bedrails?: A Little Help needed moving from lying on your back to sitting on the side of a flat bed without using bedrails?: A Lot Help needed moving to and from a bed to a chair (including a wheelchair)?: A Little Help needed standing up from a chair using your arms (e.g., wheelchair or bedside chair)?: A Lot Help needed to walk in hospital room?: A Little Help needed climbing 3-5 steps with a railing? : A Lot 6 Click Score: 15    End of Session Equipment Utilized During Treatment: Gait belt Activity Tolerance: Patient limited by fatigue Patient left: in chair;with call bell/phone within reach Nurse Communication: Mobility status PT Visit Diagnosis: Other abnormalities of gait and mobility (R26.89)    Time:  [phone] PT Time Calculation (min) (ACUTE ONLY): 24 min   Charges:   PT Evaluation $PT Eval Moderate Complexity: 1 Mod PT Treatments $Therapeutic Activity: 8-22 mins        Anastasio Champion, DPT  Acute Rehabilitation Services Office [place] 07/11/2021, 9:31 AM

## 2021-07-12 ENCOUNTER — Inpatient Hospital Stay (HOSPITAL_COMMUNITY): Payer: Self-pay

## 2021-07-12 ENCOUNTER — Inpatient Hospital Stay: Payer: Self-pay

## 2021-07-12 LAB — SODIUM, URINE, RANDOM: Sodium, Ur: 26 mmol/L

## 2021-07-12 LAB — RENAL FUNCTION PANEL
Albumin: <1.5 g/dL — ABNORMAL LOW (ref 3.5–5.0)
Anion gap: 4 — ABNORMAL LOW (ref 5–15)
BUN: 11 mg/dL (ref 6–20)
CO2: 25 mmol/L (ref 22–32)
Calcium: 7.1 mg/dL — ABNORMAL LOW (ref 8.9–10.3)
Chloride: 92 mmol/L — ABNORMAL LOW (ref 98–111)
Creatinine, Ser: 0.96 mg/dL (ref 0.61–1.24)
GFR, Estimated: >60 mL/min (ref >60)
Glucose, Bld: 136 mg/dL — ABNORMAL HIGH (ref 70–99)
Phosphorus: 2.7 mg/dL (ref 2.5–4.6)
Potassium: 4.3 mmol/L (ref 3.5–5.1)
Sodium: 121 mmol/L — ABNORMAL LOW (ref 135–145)

## 2021-07-12 LAB — GLUCOSE, CAPILLARY
Glucose-Capillary: 150 mg/dL — ABNORMAL HIGH (ref 70–99)
Glucose-Capillary: 197 mg/dL — ABNORMAL HIGH (ref 70–99)
Glucose-Capillary: 257 mg/dL — ABNORMAL HIGH (ref 70–99)
Glucose-Capillary: 194 mg/dL — ABNORMAL HIGH (ref 70–99)

## 2021-07-12 LAB — OSMOLALITY, URINE: Osmolality, Ur: 179 mOsm/kg — ABNORMAL LOW (ref 300–900)

## 2021-07-12 LAB — SODIUM: Sodium: 124 mmol/L — ABNORMAL LOW (ref 135–145)

## 2021-07-12 IMAGING — DX DG SHOULDER 2+V*L*
3 series · 3 of 3 positions shown · non-contrast
Comparison: None.

CLINICAL DATA: Acute left shoulder pain

EXAM:
LEFT SHOULDER - 2+ VIEW

[shoulder y view]
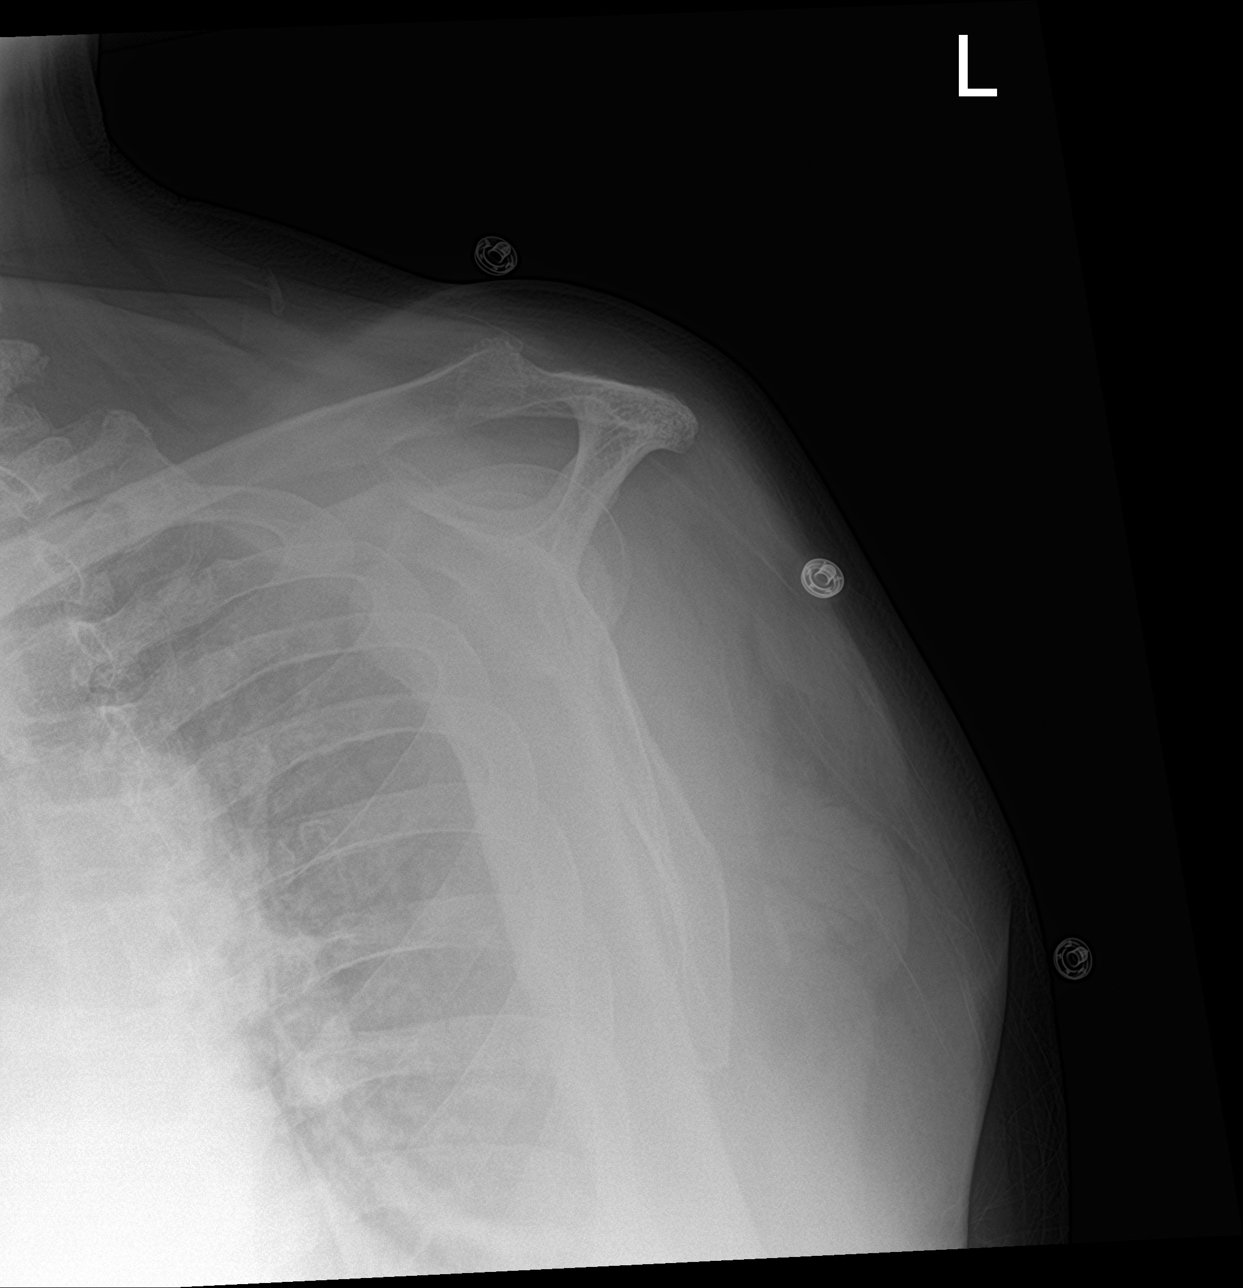

[shoulder ap neutral]
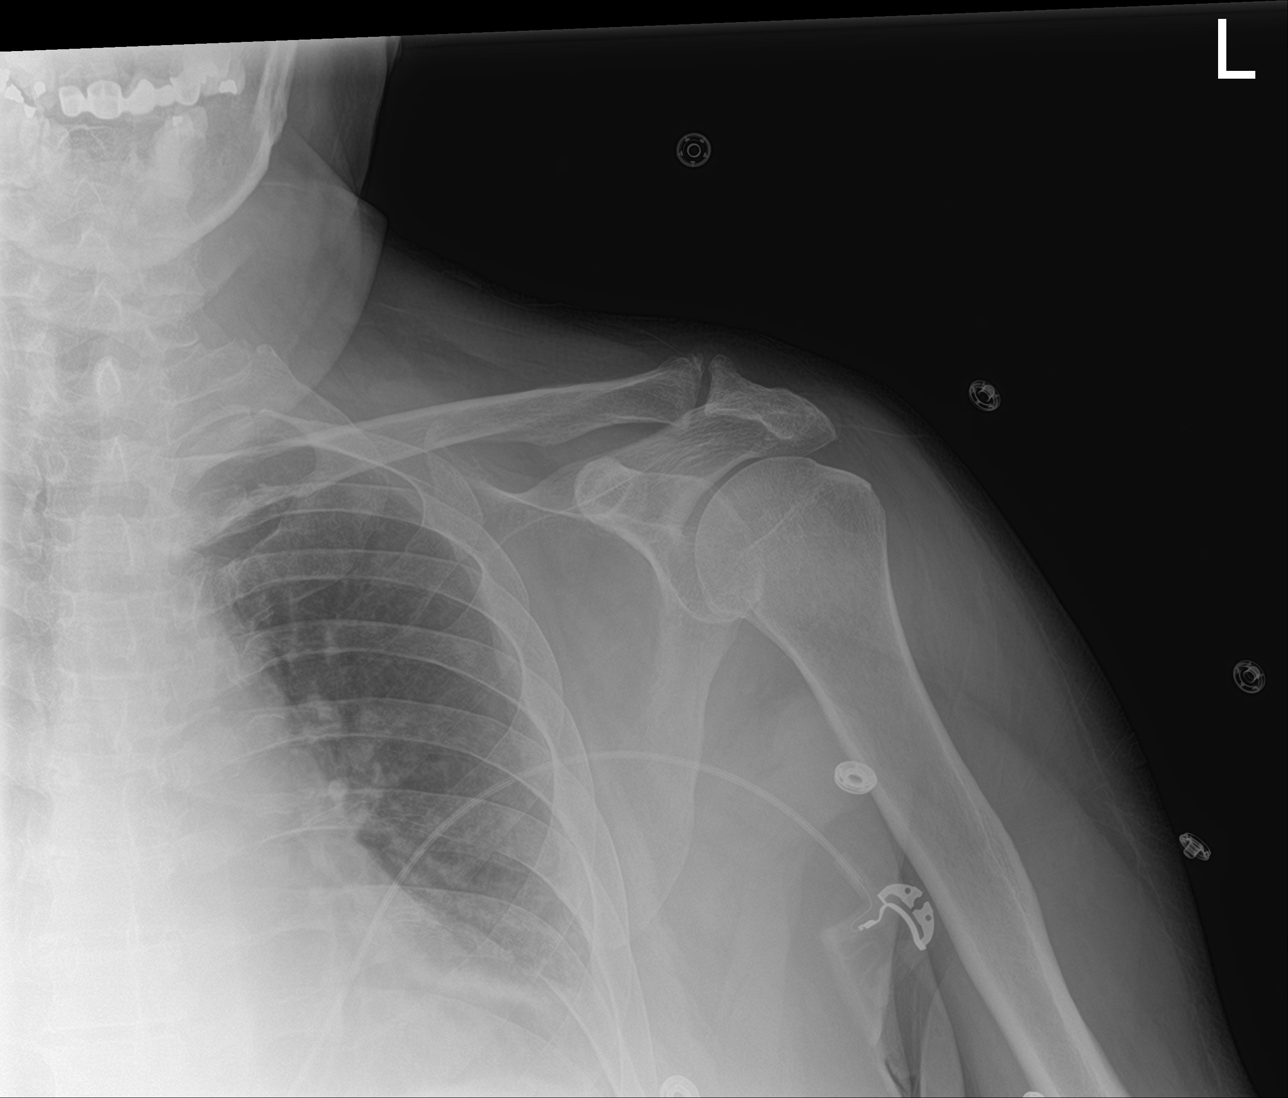

[shoulder grashey]
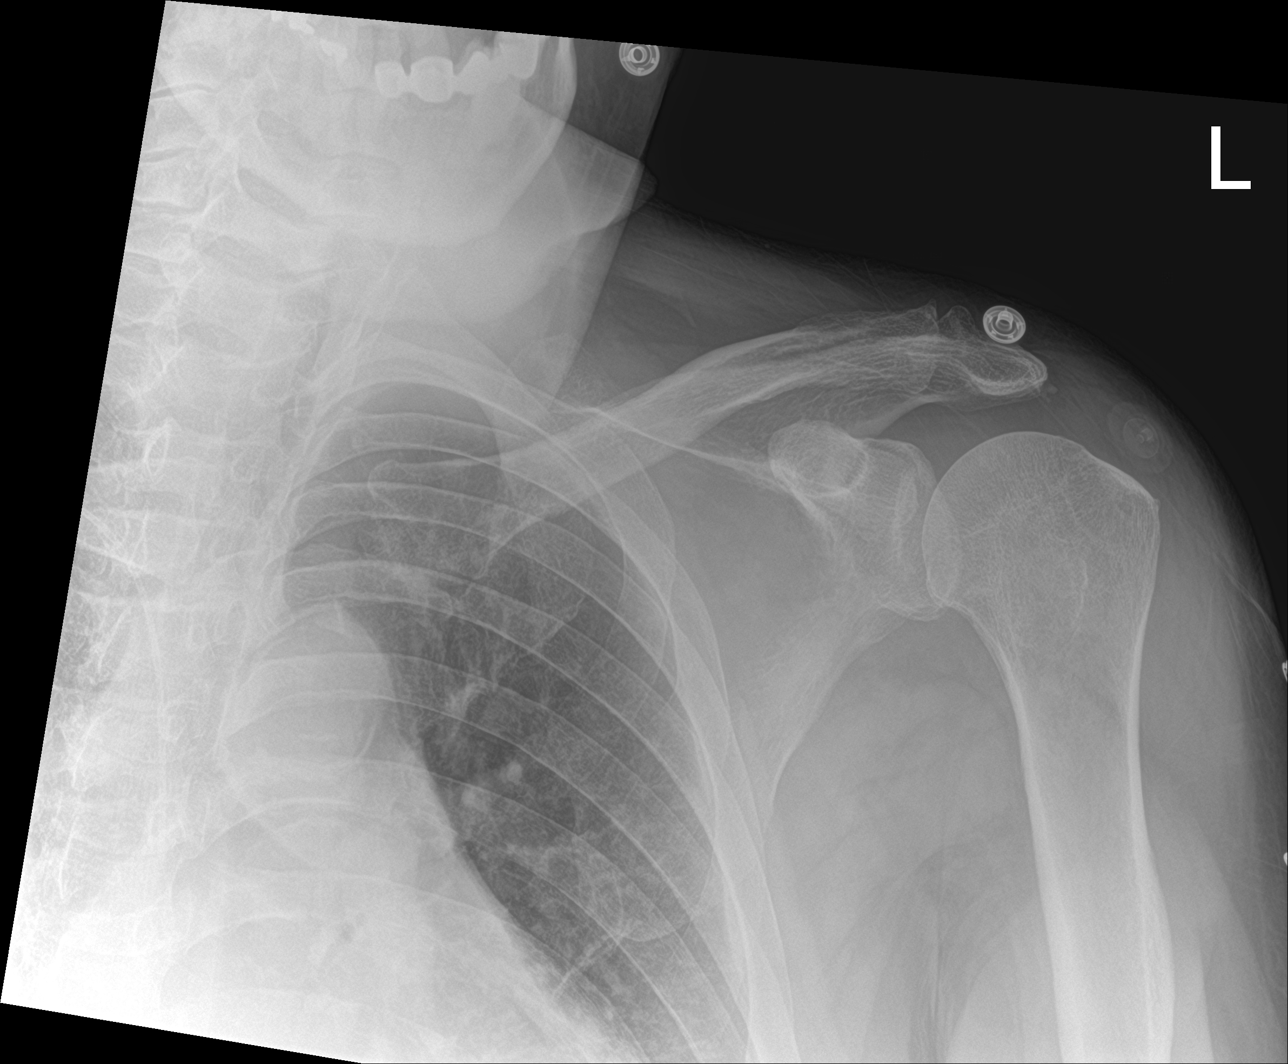

[3 of 3 positions shown; findings below may reference images not displayed]

FINDINGS: There is no evidence of fracture or dislocation. Acromioclavicular
degenerative changes. There is no evidence of severe arthropathy or
other focal bone abnormality. Soft tissues are unremarkable.
Atherosclerotic plaque of the aorta.
IMPRESSION: Negative.

## 2021-07-12 MED ORDER — UREA 15 G PO PACK
15.0000 g | PACK | Freq: Two times a day (BID) | ORAL | Status: DC
  Administered 2021-07-12 – 2021-07-14 (×5): 15 g via ORAL
  Filled 2021-07-12 (×7): qty 1

## 2021-07-12 MED ORDER — CHLORHEXIDINE GLUCONATE CLOTH 2 % EX PADS
6.0000 | MEDICATED_PAD | Freq: Every day | CUTANEOUS | Status: DC
  Administered 2021-07-13 – 2021-07-20 (×7): 6 via TOPICAL

## 2021-07-12 MED ORDER — SODIUM CHLORIDE 0.9% FLUSH
10.0000 mL | Freq: Two times a day (BID) | INTRAVENOUS | Status: DC
Start: 2021-07-12 — End: 2021-07-20
  Administered 2021-07-12 – 2021-07-19 (×7): 10 mL
  Administered 2021-07-19: 30 mL
  Administered 2021-07-20: 20 mL

## 2021-07-12 MED ORDER — SODIUM CHLORIDE 0.9% FLUSH
10.0000 mL | INTRAVENOUS | Status: DC | PRN
  Administered 2021-07-19: 10 mL
  Administered 2021-07-20: 20 mL

## 2021-07-12 MED ORDER — FUROSEMIDE 20 MG PO TABS
20.0000 mg | ORAL_TABLET | Freq: Two times a day (BID) | ORAL | Status: DC
  Administered 2021-07-12 – 2021-07-14 (×4): 20 mg via ORAL
  Filled 2021-07-12 (×4): qty 1

## 2021-07-12 NOTE — Progress Notes (Signed)
Progress Note  Patient Name: Joshua Cole Date of Encounter: 07/12/2021  [facility] Cardiologist: Kathlyn Sacramento, MD   Subjective   Denies any chest pain or dyspnea  Inpatient Medications    Scheduled Meds:  aspirin EC  81 mg Oral Daily   folic acid  1 mg Oral Daily   heparin  5,000 Units Subcutaneous Q8H   insulin aspart  0-15 Units Subcutaneous TID WC   insulin aspart  0-5 Units Subcutaneous QHS   insulin glargine-yfgn  23 Units Subcutaneous QHS   multivitamin  1 tablet Oral Daily   pantoprazole  40 mg Oral Daily   thiamine  100 mg Oral Daily   Continuous Infusions:   ceFAZolin (ANCEF) IV 2 g (07/12/21 0640)   PRN Meds: acetaminophen, LORazepam, morphine injection, nitroGLYCERIN, ondansetron (ZOFRAN) IV, oxyCODONE   Vital Signs    Vitals:   07/11/21 2300 07/12/21 0229 07/12/21 0843 07/12/21 0900  BP: (!) 144/63 127/62 (!) 117/56   Pulse: 88 87 85   Resp: 16 20 20    Temp: 98.9 F (37.2 C) 98.8 F (37.1 C) 99 F (37.2 C)   TempSrc: Oral Oral Oral   SpO2: 93% 95% 96%   Weight:    108 kg  Height:    5\' 10"  (1.778 m)    Intake/Output Summary (Last 24 hours) at 07/12/2021 0927 Last data filed at 07/12/2021 0844 Gross per 24 hour  Intake 720 ml  Output 4100 ml  Net -3380 ml    Last 3 Weights 07/12/2021 07/11/2021 07/10/2021  Weight (lbs) 238 lb 1.6 oz 248 lb 14.4 oz 243 lb 6.2 oz  Weight (kg) 108 kg 112.9 kg 110.4 kg      Telemetry    AV dissociation with junctional rhythm rates 80s - Personally Reviewed  ECG    No new EKG- Personally Reviewed  Physical Exam   GEN: No acute distress.   Neck: No JVD Cardiac: RRR, no murmurs, rubs, or gallops.  Respiratory: Clear to auscultation bilaterally. GI: Soft, nontender, non-distended  MS: No edema; No deformity. Neuro:  Nonfocal  Psych: Normal affect   Labs    High Sensitivity Troponin:  No results for input(s): TROPONINIHS in the last 720 hours.   Chemistry Recent Labs  Lab  07/07/21 1213 07/08/21 0628 07/11/21 0419 07/11/21 1323 07/12/21 0059  NA  --    < > 123* 123* 121*  K  --    < > 4.3 4.5 4.3  CL  --    < > 92* 93* 92*  CO2  --    < > 25 24 25   GLUCOSE  --    < > 135* 251* 136*  BUN  --    < > 11 14 11   CREATININE  --    < > 0.80 1.02 0.96  CALCIUM  --    < > 7.2* 7.1* 7.1*  PROT 6.8  --   --   --   --   ALBUMIN 1.9*   < > <1.5* <1.5* <1.5*  AST 27  --   --   --   --   ALT 16  --   --   --   --   ALKPHOS 64  --   --   --   --   BILITOT 1.4*  --   --   --   --   GFRNONAA  --    < > >60 >60 >60  ANIONGAP  --    < >  6 6 4*   < > = values in this interval not displayed.     Lipids  Recent Labs  Lab 07/08/21 0628  CHOL 96  TRIG 102  HDL <10*  LDLCALC NOT CALCULATED  CHOLHDL NOT CALCULATED     Hematology Recent Labs  Lab 07/07/21 0445 07/08/21 0628 07/08/21 1747  WBC 14.4* 10.2 9.0  RBC 3.51* 3.24* 3.32*  HGB 11.9* 10.7* 10.7*  HCT 32.4* 30.2* 31.7*  MCV 92.3 93.2 95.5  MCH 33.9 33.0 32.2  MCHC 36.7* 35.4 33.8  RDW 12.5 12.8 12.8  PLT 78* 84* 80*    Thyroid  Recent Labs  Lab 07/07/21 1213  TSH 0.162*     BNP No results for input(s): BNP, PROBNP in the last 168 hours.   DDimer  No results for input(s): DDIMER in the last 168 hours.    Radiology    VAS [place] UPPER EXTREMITY VENOUS DUPLEX  Result Date: 07/11/2021 UPPER VENOUS STUDY  Patient Name:  TALAN GILDNER  Date of Exam:   07/11/2021 Medical Rec #: [id number]         Accession #:    [id number] Date of Birth: 04/15/1961        Patient Gender: M Patient Age:   83 years Exam Location:  [facility] Procedure:      VAS [place] UPPER EXTREMITY VENOUS DUPLEX Referring Phys: Cristopher Peru --------------------------------------------------------------------------------  Indications: Patient states he is unable to raise arm Limitations: Patient somnolent and unable to keep head and arm turned. Comparison Study: No prior study Performing Technologist: Sharion Dove RVS   Examination Guidelines: A complete evaluation includes B-mode imaging, spectral Doppler, color Doppler, and power Doppler as needed of all accessible portions of each vessel. Bilateral testing is considered an integral part of a complete examination. Limited examinations for reoccurring indications may be performed as noted.  Right Findings: +----------+------------+---------+-----------+----------+-------+ RIGHT     CompressiblePhasicitySpontaneousPropertiesSummary +----------+------------+---------+-----------+----------+-------+ Subclavian               Yes       Yes                      +----------+------------+---------+-----------+----------+-------+  Left Findings: +----------+------------+---------+-----------+----------+--------------+ LEFT      CompressiblePhasicitySpontaneousProperties   Summary     +----------+------------+---------+-----------+----------+--------------+ IJV                                                 Not visualized +----------+------------+---------+-----------+----------+--------------+ Subclavian               Yes       Yes                             +----------+------------+---------+-----------+----------+--------------+ Brachial      Full                                                 +----------+------------+---------+-----------+----------+--------------+ Cephalic      Full                                                 +----------+------------+---------+-----------+----------+--------------+  Basilic       Full                                                 +----------+------------+---------+-----------+----------+--------------+  Summary:  Right: No evidence of superficial vein thrombosis in the upper extremity.  Left: No obvious evidence of deep vein thrombosis in the upper extremity. No evidence of superficial vein thrombosis in the upper extremity.  *See table(s) above for measurements and observations.     Preliminary     Cardiac Studies   TEE 07/07/2021: 1. Left ventricular ejection fraction, by estimation, is 55 to 60%. The  left ventricle has normal function.   2. Right ventricular systolic function is normal. The right ventricular  size is normal.   3. No left atrial/left atrial appendage thrombus was detected.   4. The mitral valve is normal in structure. Mild mitral valve  regurgitation.   5. There is a mobile mass attached to the tricuspid valve (clip 132)  consistent with a vegetation given the current clinical context.. The  tricuspid valve is degenerative.   6. The aortic valve is tricuspid. Aortic valve regurgitation is not  visualized.   Conclusion(s)/Recommendation(s): Findings are concerning for  vegetation/infective endocarditis as detailed above.  __________   2D echo 07/04/2021:  1. Left ventricular ejection fraction, by estimation, is 55 to 60%. The  left ventricle has normal function. Left ventricular endocardial border  not optimally defined to evaluate regional wall motion. There is mild left  ventricular hypertrophy. Left  ventricular diastolic parameters are indeterminate.   2. Right ventricular systolic function is normal. The right ventricular  size is normal. Tricuspid regurgitation signal is inadequate for assessing  PA pressure.   3. Left atrial size was mildly dilated.   4. Right atrial size was mildly dilated.   5. The mitral valve is normal in structure. No evidence of mitral valve  regurgitation. No evidence of mitral stenosis.   6. The aortic valve is normal in structure. Aortic valve regurgitation is  not visualized. Mild aortic valve sclerosis is present, with no evidence  of aortic valve stenosis.   7. Aortic dilatation noted. There is borderline dilatation of the aortic  root and of the ascending aorta, measuring 38 mm.   8. The tricuspid was not well visualized. However, there is likely a  mobile vergetation noted on the short axis images.  Recommend a TEE.  Patient Profile     60 y.o. male  with gout, alcohol use, bilateral THA, and poor dental hygiene who was admitted to [facility] on 07/03/2021 with MSSA bacteremia and found to have a tricuspid valve vegetation complicated by complete heart block with good escape rhythm who is being seen 07/08/2021 for the evaluation of the above.  Assessment & Plan    MSSA bacteremia with tricuspid valve vegetation with acute metabolic encephalopathy: -- IV Ancef -- Appreciate ID recs -- Mental status improved, suspected to be in the setting of his bacteremia  -- Transferred to [facility] for cardiothoracic surgery evaluation.  Seen by Dr. Kipp Brood, recommend medical management --Will place PICC line   Complete heart block: In the setting of MSSA bacteremia with tricuspid valve vegetation.  -- Evaluated by EP (while at [facility]) with no current indication for venous temp wire at this time, given good escape rhythm, particularly in the context of ongoing bacteremia  --  EP recommended pacemaker at time of valve replacement.  We will touch base with EP since not going for surgery.   -- Avoiding AV nodal blocking agents    Respiratory failure: Stable, weaned to RA with stable sats. -- CTA chest negative for PE with concern for multiple ill-defined lesions   Hyponatremia: remains low at 121. Suspected in the setting of ETOH use -- Nephrology following, appreciate recs -- 1200cc fluid restriction  -- started on lasix 20mg  daily   Thrombocytopenia: Stable, 57>>78>>84 -- evaluated by oncology  -- Suspected to be in the setting of alcohol use   Alcohol use: -- Cessation advised    DM: Newly diagnosed  with A1c 7.8 --Evaluated by diabetic coordinator at [facility] [facility] --Needs outpatient follow up with PCP   Arthralgias: -- Evaluated by orthopedics: Minimal concern for periprosthetic hip infection without need for aspiration  -- Continue previously ordered PRN oxycodone and morphine   Left arm  swelling: Duplex negative for DVT  Consults:  Oncology, ID, Ortho, EP, Nephrology   For questions or updates, please contact [place] Please consult [email or web address] for contact info under   Signed, Donato Heinz, MD  07/12/2021, 9:27 AM

## 2021-07-12 NOTE — Progress Notes (Signed)
[facility] for Infectious Disease    Date of Admission:  07/08/2021   Total days of antibiotics 10          ID: Joshua Cole is a 60 y.o. male with  MSSA bacteremia and TV endocarditis Active Problems:   Acute metabolic encephalopathy   Generalized weakness   Bacteremia   Acute bacterial endocarditis   Heart block   MSSA bacteremia    Subjective: Afebrile, but having difficulty with moving his left arm without assistance of functioning arm. Tender at joint. Previously fully functioning prior to hospitalization. Last fever 48hrs ago  Medications:   aspirin EC  81 mg Oral Daily   folic acid  1 mg Oral Daily   furosemide  20 mg Oral BID   heparin  5,000 Units Subcutaneous Q8H   insulin aspart  0-15 Units Subcutaneous TID WC   insulin aspart  0-5 Units Subcutaneous QHS   insulin glargine-yfgn  23 Units Subcutaneous QHS   multivitamin  1 tablet Oral Daily   pantoprazole  40 mg Oral Daily   thiamine  100 mg Oral Daily   urea  15 g Oral BID    Objective: Vital signs in last 24 hours: Temp:  [97.6 F (36.4 C)-99.4 F (37.4 C)] 97.6 F (36.4 C) (10/31 1150) Pulse Rate:  [85-93] 85 (10/31 0843) Resp:  [16-20] 20 (10/31 0843) BP: (103-144)/(56-64) 103/64 (10/31 1150) SpO2:  [93 %-100 %] 100 % (10/31 1150) Weight:  [108 kg-108.6 kg] 108.6 kg (10/31 0956) Physical Exam  Constitutional: He is oriented to person, place, and time. He appears well-developed and well-nourished. No distress.  HENT:  Mouth/Throat: Oropharynx is clear and moist. No oropharyngeal exudate.  Cardiovascular: Normal rate, regular rhythm and normal heart sounds. Exam reveals no gallop and no friction rub.  No murmur heard.  Pulmonary/Chest: Effort normal and breath sounds normal. No respiratory distress. He has no wheezes.  Abdominal: Soft. Bowel sounds are normal. He exhibits no distension. There is no tenderness.  Lymphadenopathy:  He has no cervical adenopathy.  Ext: left arm difficult to  raise above head Neurological: He is alert and oriented to person, place, and time.  Skin: Skin is warm and dry. No rash noted. No erythema.  Psychiatric: He has a normal mood and affect. His behavior is normal.   Lab Results Recent Labs    07/11/21 1323 07/12/21 0059  NA 123* 121*  K 4.5 4.3  CL 93* 92*  CO2 24 25  BUN 14 11  CREATININE 1.02 0.96   Liver Panel Recent Labs    07/11/21 1323 07/12/21 0059  ALBUMIN <1.5* <1.5*     Microbiology: 10/24 blood cx NGTD 10/22 blood cx MSSA Studies/Results: VAS [place] UPPER EXTREMITY VENOUS DUPLEX  Result Date: 07/11/2021 UPPER VENOUS STUDY  Patient Name:  Joshua Cole  Date of Exam:   07/11/2021 Medical Rec #: [id number]         Accession #:    [id number] Date of Birth: 20-Jan-1961        Patient Gender: M Patient Age:   80 years Exam Location:  [facility] Procedure:      VAS [place] UPPER EXTREMITY VENOUS DUPLEX Referring Phys: Cristopher Peru --------------------------------------------------------------------------------  Indications: Patient states he is unable to raise arm Limitations: Patient somnolent and unable to keep head and arm turned. Comparison Study: No prior study Performing Technologist: Sharion Dove RVS  Examination Guidelines: A complete evaluation includes B-mode imaging, spectral Doppler, color Doppler, and power Doppler  as needed of all accessible portions of each vessel. Bilateral testing is considered an integral part of a complete examination. Limited examinations for reoccurring indications may be performed as noted.  Right Findings: +----------+------------+---------+-----------+----------+-------+ RIGHT     CompressiblePhasicitySpontaneousPropertiesSummary +----------+------------+---------+-----------+----------+-------+ Subclavian               Yes       Yes                      +----------+------------+---------+-----------+----------+-------+  Left Findings:  +----------+------------+---------+-----------+----------+--------------+ LEFT      CompressiblePhasicitySpontaneousProperties   Summary     +----------+------------+---------+-----------+----------+--------------+ IJV                                                 Not visualized +----------+------------+---------+-----------+----------+--------------+ Subclavian               Yes       Yes                             +----------+------------+---------+-----------+----------+--------------+ Brachial      Full                                                 +----------+------------+---------+-----------+----------+--------------+ Cephalic      Full                                                 +----------+------------+---------+-----------+----------+--------------+ Basilic       Full                                                 +----------+------------+---------+-----------+----------+--------------+  Summary:  Right: No evidence of superficial vein thrombosis in the upper extremity.  Left: No obvious evidence of deep vein thrombosis in the upper extremity. No evidence of superficial vein thrombosis in the upper extremity.  *See table(s) above for measurements and observations.    Preliminary    [place] EKG SITE RITE  Result Date: 07/12/2021 If Site Rite image not attached, placement could not be confirmed due to current cardiac rhythm.    Assessment/Plan: MSSA TV endocarditis/bacteremia = other nidus of infections still need to be ruled out. I am concerned about his inability to raise left arm/shoulder pain concerning for septic arthritis. Also has right hip fluid collection of unclear etiology  Please get --1st plain films of left shoulder to decide if need MRI-  Please have radiology review MRI to see if RIGHT hip fluid collection can be aspirated for cell count and culture  [facility] for Infectious Diseases Pager:  [phone]  07/12/2021, 2:25 PM

## 2021-07-12 NOTE — Evaluation (Signed)
Occupational Therapy Evaluation Patient Details Name: Joshua Cole MRN: [id number] DOB: 04/10/1961 Today's Date: 07/12/2021   History of Present Illness Pt is a 60 y/o M admitted on 07/03/21 with c/c of weakness, joint pain, confusion, N&V, diarrhea & fever. Pt received flu shot & covid booster on Friday. On Saturday night pt developed fever & chills. Pt was seen in Urgent care on 10/20 & tested negative for flu & covid but was started on tamiflu & steroid. Pt is currently being treated for hyponatremia & acute metabolic encephalopathy of unclear etiology. Brain MRI was ordered but was negative for acute processes. Blood cultures positive for Staph aureus. 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 Also developed complete heart block.Transferred to [facility] on 10/27. CTA chest negative for PE with concern for multiple ill-defined lesions. PMH: gout, alcohol use   Clinical Impression   Pt was independent prior to admission, he works as a Recruitment consultant. Pt presents with generalized weakness, L shoulder and hip/groin pain and poor standing balance. He needs mod assist for bed mobility and min assist to stand and transfer with RW. Pt requires set up to total assist for ADL. Will follow acutely.      Recommendations for follow up therapy are one component of a multi-disciplinary discharge planning process, led by the attending physician.  Recommendations may be updated based on patient status, additional functional criteria and insurance authorization.   Follow Up Recommendations  Acute inpatient rehab (3hours/day)    Assistance Recommended at Discharge Frequent or constant Supervision/Assistance  Functional Status Assessment  Patient has had a recent decline in their functional status and demonstrates the ability to make significant improvements in function in a reasonable and predictable amount of time.  Equipment Recommendations  Other (comment) (defer to next venue of care)     Recommendations for Other Services       Precautions / Restrictions Precautions Precautions: Fall Restrictions Weight Bearing Restrictions: No      Mobility Bed Mobility Overal bed mobility: Needs Assistance Bed Mobility: Rolling;Sidelying to Sit Rolling: Min assist Sidelying to sit: Mod assist       General bed mobility comments: increased time and effort, assist for LEs over EOB and to raise trunk    Transfers Overall transfer level: Needs assistance Equipment used: Rolling walker (2 wheels) Transfers: Sit to/from [employer] Transfers Sit to Stand: Min assist;+2 safety/equipment Stand pivot transfers: Min assist         General transfer comment: stood to weigh on scale with bed heightened and nurse for safety, transferred bed to chair with +1 min assist      Balance Overall balance assessment: Needs assistance         Standing balance support: Bilateral upper extremity supported Standing balance-Leahy Scale: Poor Standing balance comment: B UE and min external support                           ADL either performed or assessed with clinical judgement   ADL Overall ADL's : Needs assistance/impaired Eating/Feeding: Set up;Sitting   Grooming: Wash/dry hands;Wash/dry face;Oral care;Sitting;Set up   Upper Body Bathing: Set up;Sitting   Lower Body Bathing: Sit to/from stand;Total assistance   Upper Body Dressing : Minimal assistance;Sitting   Lower Body Dressing: Total assistance;Sit to/from stand   Toilet Transfer: Minimal assistance;Stand-pivot;BSC;Rolling walker (2 wheels)   Toileting- Clothing Manipulation and Hygiene: Total assistance;Bed level  Vision Ability to See in Adequate Light: 0 Adequate Patient Visual Report: No change from baseline       Perception     Praxis      Pertinent Vitals/Pain Pain Assessment: Faces Faces Pain Scale: Hurts even more Pain Location: L shoulder, L groin Pain  Descriptors / Indicators: Aching;Sore Pain Intervention(s): Monitored during session;Repositioned     Hand Dominance Right   Extremity/Trunk Assessment Upper Extremity Assessment Upper Extremity Assessment: LUE deficits/detail LUE Deficits / Details: painful shoulder, active flexion to 45 degrees, PROM to 90 before pain LUE Coordination: decreased gross motor   Lower Extremity Assessment Lower Extremity Assessment: Defer to PT evaluation   Cervical / Trunk Assessment Cervical / Trunk Assessment: Other exceptions Cervical / Trunk Exceptions: obesity   Communication Communication Communication: No difficulties   Cognition Arousal/Alertness: Awake/alert Behavior During Therapy: WFL for tasks assessed/performed Overall Cognitive Status: Within Functional Limits for tasks assessed                                       General Comments       Exercises     Shoulder Instructions      Home Living Family/patient expects to be discharged to:: Private residence Living Arrangements: Spouse/significant other Available Help at Discharge: Family;Available PRN/intermittently Type of Home: House Home Access: Stairs to enter [employer] of Steps: 3 Entrance Stairs-Rails: Right;Left Home Layout: Two level;Able to live on main level with bedroom/bathroom     Bathroom Shower/Tub: Teacher, early years/pre: Standard     Home Equipment: Conservation officer, nature (2 wheels);Shower seat          Prior Functioning/Environment Prior Level of Function : Independent/Modified Independent             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 Problem List: Decreased strength;Decreased activity tolerance;Impaired balance (sitting and/or standing);Pain      OT Treatment/Interventions: Self-care/ADL training;Therapeutic exercise;Therapeutic activities;Patient/family education;Balance training    OT Goals(Current goals can be found in the care  plan section) Acute Rehab OT Goals Patient Stated Goal: return home OT Goal Formulation: With patient Time For Goal Achievement: 07/26/21 Potential to Achieve Goals: Good ADL Goals Pt Will Perform Grooming: with supervision;standing Pt Will Perform Upper Body Dressing: with set-up;sitting Pt Will Perform Lower Body Dressing: with min assist;with adaptive equipment;sit to/from stand Pt Will Transfer to Toilet: with supervision;ambulating;bedside commode Pt Will Perform Toileting - Clothing Manipulation and hygiene: with supervision;sit to/from stand Pt/caregiver will Perform Home Exercise Program: Left upper extremity;Independently;Increased ROM Additional ADL Goal #1: Pt will perform bed mobility with min assist in preparation for ADL.  OT Frequency: Min 2X/week   Barriers to D/C:            Co-evaluation              AM-PAC OT "6 Clicks" Daily Activity     Outcome Measure Help from another person eating meals?: None Help from another person taking care of personal grooming?: A Little Help from another person toileting, which includes using toliet, bedpan, or urinal?: Total Help from another person bathing (including washing, rinsing, drying)?: A Lot Help from another person to put on and taking off regular upper body clothing?: A Little Help from another person to put on and taking off regular lower body clothing?: Total 6 Click Score: 14   End  of Session Equipment Utilized During Treatment: Gait belt;Rolling walker (2 wheels) Nurse Communication: Mobility status  Activity Tolerance: Patient tolerated treatment well Patient left: with call bell/phone within reach;in chair  OT Visit Diagnosis: Muscle weakness (generalized) (M62.81);Pain;Unsteadiness on feet (R26.81) Pain - Right/Left: Left Pain - part of body: Shoulder;Hip                Time: 9144-4584 OT Time Calculation (min): 50 min Charges:  OT General Charges $OT Visit: 1 Visit OT Evaluation $OT Eval Moderate  Complexity: 1 Mod OT Treatments $Self Care/Home Management : 23-37 mins  Nestor Lewandowsky, OTR/L Acute Rehabilitation Services Pager: [phone] Office: [phone]   Joshua Cole 07/12/2021, 11:20 AM

## 2021-07-12 NOTE — Progress Notes (Signed)
Okay to place PICC per Dr. Johnney Ou with nephrology.

## 2021-07-12 NOTE — Progress Notes (Signed)
   07/12/21 1215  Clinical Encounter Type  Visited With Patient and family together  Visit Type Initial  Referral From Nurse  Consult/Referral To Chaplain   Chaplain responded to the consult request. The patient's daughter was at his bedside. She presented the legal document POA and HCPOA that was not a [facility] document requesting it to be notarized. Advise his family member that we can only notarize Clayton's documents. I gave the patient our Advance Directive and provided education. The patient wants his girlfriend to be his HCPOA. The chaplain instructed him to inform his nurse once he had filled out the Advance Directive. This note was prepared by Jeanine Luz, M.Div..  For questions please contact by phone [phone].

## 2021-07-12 NOTE — Progress Notes (Signed)
[facility] Progress Note   Subjective:   Excellent urine output with oral Lasix yesterday 20 mg at 3.2 L.  Appears fairly clear today.  Denies significant confusion, abdominal pain, nausea.  Appetite fairly good.  Sodium remaining low at 121 today  Objective Vitals:   07/12/21 0843 07/12/21 0900 07/12/21 0956 07/12/21 1150  BP: (!) 117/56   103/64  Pulse: 85     Resp: 20     Temp: 99 F (37.2 C)   97.6 F (36.4 C)  TempSrc: Oral   Oral  SpO2: 96%   100%  Weight:  108 kg 108.6 kg   Height:  5\' 10"  (1.778 m)     Physical Exam Gen: appears comfortable sitting up in bed Eyes: anicteric, EOMI ENT: MMM Neck: supple, no JVD CV:  RRR, no murmur appreciated Abd: soft, obese Lungs: Bilateral chest rise with no increased work of breathing GU: no foley Extr:  1+ pedal and tibial edema Neuro: nonfocal  Additional Objective Labs: Basic Metabolic Panel: Recent Labs  Lab 07/11/21 0419 07/11/21 1323 07/12/21 0059  NA 123* 123* 121*  K 4.3 4.5 4.3  CL 92* 93* 92*  CO2 25 24 25   GLUCOSE 135* 251* 136*  BUN 11 14 11   CREATININE 0.80 1.02 0.96  CALCIUM 7.2* 7.1* 7.1*  PHOS 3.2 3.4 2.7   Liver Function Tests: Recent Labs  Lab 07/07/21 1213 07/09/21 1319 07/11/21 0419 07/11/21 1323 07/12/21 0059  AST 27  --   --   --   --   ALT 16  --   --   --   --   ALKPHOS 64  --   --   --   --   BILITOT 1.4*  --   --   --   --   PROT 6.8  --   --   --   --   ALBUMIN 1.9*   < > <1.5* <1.5* <1.5*   < > = values in this interval not displayed.   No results for input(s): LIPASE, AMYLASE in the last 168 hours. CBC: Recent Labs  Lab 07/06/21 0814 07/07/21 0445 07/08/21 0628 07/08/21 1747  WBC 13.8* 14.4* 10.2 9.0  NEUTROABS 11.5* 11.9* 8.3*  --   HGB 12.6* 11.9* 10.7* 10.7*  HCT 35.2* 32.4* 30.2* 31.7*  MCV 93.4 92.3 93.2 95.5  PLT 57* 78* 84* 80*   Blood Culture    Component Value Date/Time   SDES BLOOD RIGHT WRIST 07/05/2021 0747   SDES BLOOD BLOOD RIGHT  HAND 07/05/2021 0747   SPECREQUEST  07/05/2021 0747    BOTTLES DRAWN AEROBIC AND ANAEROBIC Blood Culture adequate volume   SPECREQUEST  07/05/2021 0747    BOTTLES DRAWN AEROBIC ONLY Blood Culture adequate volume   CULT  07/05/2021 0747    NO GROWTH 5 DAYS Performed at [facility], [address] [place], [place] [address]    CULT  07/05/2021 0747    NO GROWTH 5 DAYS Performed at [facility], [address] [place], [place] [address]    REPTSTATUS 07/10/2021 FINAL 07/05/2021 0747   REPTSTATUS 07/10/2021 FINAL 07/05/2021 0747    Cardiac Enzymes: No results for input(s): CKTOTAL, CKMB, CKMBINDEX, TROPONINI in the last 168 hours. CBG: Recent Labs  Lab 07/11/21 0635 07/11/21 1246 07/11/21 1739 07/11/21 2117 07/12/21 0640  GLUCAP 124* 280* 198* 224* 150*   Iron Studies: No results for input(s): IRON, TIBC, TRANSFERRIN, FERRITIN in the last 72 hours. @lablastinr3 @ Studies/Results: VAS [place]  UPPER EXTREMITY VENOUS DUPLEX  Result Date: 07/11/2021 UPPER VENOUS STUDY  Patient Name:  Joshua Cole  Date of Exam:   07/11/2021 Medical Rec #: [id number]         Accession #:    [id number] Date of Birth: 08-16-1961        Patient Gender: M Patient Age:   61 years Exam Location:  [facility] Procedure:      VAS [place] UPPER EXTREMITY VENOUS DUPLEX Referring Phys: Cristopher Peru --------------------------------------------------------------------------------  Indications: Patient states he is unable to raise arm Limitations: Patient somnolent and unable to keep head and arm turned. Comparison Study: No prior study Performing Technologist: Sharion Dove RVS  Examination Guidelines: A complete evaluation includes B-mode imaging, spectral Doppler, color Doppler, and power Doppler as needed of all accessible portions of each vessel. Bilateral testing is considered an integral part of a complete examination. Limited examinations for reoccurring indications may be performed as  noted.  Right Findings: +----------+------------+---------+-----------+----------+-------+ RIGHT     CompressiblePhasicitySpontaneousPropertiesSummary +----------+------------+---------+-----------+----------+-------+ Subclavian               Yes       Yes                      +----------+------------+---------+-----------+----------+-------+  Left Findings: +----------+------------+---------+-----------+----------+--------------+ LEFT      CompressiblePhasicitySpontaneousProperties   Summary     +----------+------------+---------+-----------+----------+--------------+ IJV                                                 Not visualized +----------+------------+---------+-----------+----------+--------------+ Subclavian               Yes       Yes                             +----------+------------+---------+-----------+----------+--------------+ Brachial      Full                                                 +----------+------------+---------+-----------+----------+--------------+ Cephalic      Full                                                 +----------+------------+---------+-----------+----------+--------------+ Basilic       Full                                                 +----------+------------+---------+-----------+----------+--------------+  Summary:  Right: No evidence of superficial vein thrombosis in the upper extremity.  Left: No obvious evidence of deep vein thrombosis in the upper extremity. No evidence of superficial vein thrombosis in the upper extremity.  *See table(s) above for measurements and observations.    Preliminary    [place] EKG SITE RITE  Result Date: 07/12/2021 If Site Rite image not attached, placement could not be confirmed due to current cardiac rhythm.  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  ID following.  Panorex ok.  At [facility] for CTS consult -- no surgery, IV abx should cure.    **Hyponatremia: Thought to be related to alcohol use with low solute intake but has failed to improve with IV hydration.  TSH not low, no signs of adrenal insufficiency, no paraproteinemia.  Now with some signs of volume excess.  Repeat urine studies today with fairly good urine osmolality 179 and urine sodium of 26.  Not high enough to suggest overt SIADH but could represent some degree of heart failure playing a role. Will continue with lasix 20mg  BID for now and consider addition of urea if Na fails to improve. Monitor Na closely today.   **Anemia: mild in 10s, monitor.    **EtOH overuse: thiamine/folate.     **DM: new dx, A1c was 7.8. Per primary.    **thrombocytopenia: Plt in 70-80s.  Heme/onc has been consulted.  SFLC normal.  Likely 2/2 alcohol  **Hypoalbuminemia: severely low <1.5. Nutrition playing a role but fairly profound. May be a good idea to get a liver US to look into possible cirrhosis.   Will follow, call with concerns.

## 2021-07-12 NOTE — Progress Notes (Signed)
Peripherally Inserted Central Catheter Placement  The IV Nurse has discussed with the patient and/or persons authorized to consent for the patient, the purpose of this procedure and the potential benefits and risks involved with this procedure.  The benefits include less needle sticks, lab draws from the catheter, and the patient may be discharged home with the catheter. Risks include, but not limited to, infection, bleeding, blood clot (thrombus formation), and puncture of an artery; nerve damage and irregular heartbeat and possibility to perform a PICC exchange if needed/ordered by physician.  Alternatives to this procedure were also discussed.  Bard Power PICC patient education guide, fact sheet on infection prevention and patient information card has been provided to patient /or left at bedside.  PICC inserted by Shon Hale, RN  PICC Placement Documentation  PICC Single Lumen 49/70/26 Right Basilic 41 cm 0 cm (Active)  Indication for Insertion or Continuance of Line Home intravenous therapies (PICC only) 07/12/21 1718  Exposed Catheter (cm) 0 cm 07/12/21 1718  Site Assessment Clean;Dry;Intact 07/12/21 1718  Line Status Flushed;Saline locked;Blood return noted 07/12/21 1718  Dressing Type Transparent 07/12/21 1718  Dressing Status Clean;Dry;Intact 07/12/21 1718  Antimicrobial disc in place? Yes 07/12/21 1718  Safety Lock Not Applicable 37/85/88 5027  Line Care Connections checked and tightened 07/12/21 1718  Dressing Intervention New dressing 07/12/21 1718  Dressing Change Due 07/19/21 07/12/21 1718       Crockett Rallo, Nicolette Bang 07/12/2021, 5:20 PM

## 2021-07-12 NOTE — Progress Notes (Signed)
Mobility Specialist: Progress Note   07/12/21 1302  Mobility  Activity Ambulated in room  Level of Assistance Minimal assist, patient does 75% or more  Assistive Device Front wheel walker  Distance Ambulated (ft) 24 ft  Mobility Ambulated with assistance in room  Mobility Response Tolerated well  Mobility performed by Mobility specialist  $Mobility charge 1 Mobility   Pre-Mobility: 94 HR, 116/64 BP Post-Mobility: 91 HR, 116/67 BP  Pt required minA to stand from recliner with c/o 4/10 bilateral hip pain during ambulation. Pt to bed after walk per request with call bell and phone at his side.   [facility] Mathilde Mcwherter Mobility Specialist Mobility Specialist Phone: [phone]

## 2021-07-12 NOTE — Progress Notes (Signed)
Inpatient Diabetes Program Recommendations  AACE/ADA: New Consensus Statement on Inpatient Glycemic Control   Target Ranges:  Prepandial:   less than 140 mg/dL      Peak postprandial:   less than 180 mg/dL (1-2 hours)      Critically ill patients:  140 - 180 mg/dL  Results for Joshua Cole, Joshua Cole (MRN [id number]) as of 07/12/2021 11:35  Ref. Range 07/11/2021 06:35 07/11/2021 12:46 07/11/2021 17:39 07/11/2021 21:17 07/12/2021 06:40  Glucose-Capillary Latest Ref Range: 70 - 99 mg/dL 124 (H) 280 (H) 198 (H) 224 (H) 150 (H)   Review of Glycemic Control  Admit with:  MSSA bacteremia Sepsis  Acute hypoxic respiratory failure New diagnosis diabetes mellitus with hyperglycemia (seen by diabetes coordinator at [facility] on 10/26 and 10/27)     Current Orders: Semglee 23 units QHS, Novolog 0-15 units TID AC + HS   Inpatient Diabetes Program Recommendations:    Insulin: Please consider ordering Novolog 4 units TID with meals for meal coverage if patient eats at least 50% of meals.  Thanks, Barnie Alderman, RN, MSN, CDE Diabetes Coordinator Inpatient Diabetes Program [phone] (Team Pager from 8am to 5pm)

## 2021-07-13 ENCOUNTER — Inpatient Hospital Stay (HOSPITAL_COMMUNITY): Payer: Self-pay

## 2021-07-13 LAB — RENAL FUNCTION PANEL
Albumin: <1.5 g/dL — ABNORMAL LOW (ref 3.5–5.0)
Anion gap: 5 (ref 5–15)
BUN: 23 mg/dL — ABNORMAL HIGH (ref 6–20)
CO2: 25 mmol/L (ref 22–32)
Calcium: 7.6 mg/dL — ABNORMAL LOW (ref 8.9–10.3)
Chloride: 95 mmol/L — ABNORMAL LOW (ref 98–111)
Creatinine, Ser: 0.91 mg/dL (ref 0.61–1.24)
GFR, Estimated: >60 mL/min (ref >60)
Glucose, Bld: 215 mg/dL — ABNORMAL HIGH (ref 70–99)
Phosphorus: 3.2 mg/dL (ref 2.5–4.6)
Potassium: 4.4 mmol/L (ref 3.5–5.1)
Sodium: 125 mmol/L — ABNORMAL LOW (ref 135–145)

## 2021-07-13 LAB — SODIUM: Sodium: 125 mmol/L — ABNORMAL LOW (ref 135–145)

## 2021-07-13 LAB — CBC
HCT: 27.6 % — ABNORMAL LOW (ref 39.0–52.0)
Hemoglobin: 9.1 g/dL — ABNORMAL LOW (ref 13.0–17.0)
MCH: 32.2 pg (ref 26.0–34.0)
MCHC: 33 g/dL (ref 30.0–36.0)
MCV: 97.5 fL (ref 80.0–100.0)
Platelets: 132 10*3/uL — ABNORMAL LOW (ref 150–400)
RBC: 2.83 MIL/uL — ABNORMAL LOW (ref 4.22–5.81)
RDW: 12.5 % (ref 11.5–15.5)
WBC: 8.9 10*3/uL (ref 4.0–10.5)
nRBC: 0 % (ref 0.0–0.2)

## 2021-07-13 LAB — GLUCOSE, CAPILLARY
Glucose-Capillary: 149 mg/dL — ABNORMAL HIGH (ref 70–99)
Glucose-Capillary: 259 mg/dL — ABNORMAL HIGH (ref 70–99)
Glucose-Capillary: 227 mg/dL — ABNORMAL HIGH (ref 70–99)
Glucose-Capillary: 213 mg/dL — ABNORMAL HIGH (ref 70–99)

## 2021-07-13 MED ORDER — ALUM & MAG HYDROXIDE-SIMETH 200-200-20 MG/5ML PO SUSP
30.0000 mL | ORAL | Status: DC | PRN
  Administered 2021-07-13: 30 mL via ORAL
  Filled 2021-07-13: qty 30

## 2021-07-13 NOTE — TOC Initial Note (Signed)
Transition of Care [facility]) - Initial/Assessment Note    Patient Details  Name: Joshua Cole MRN: [id number] Date of Birth: 1961/01/06  Transition of Care [facility]) CM/SW Contact:    Vinie Sill, LCSW Phone Number: 07/13/2021, 2:29 PM  Clinical Narrative:                  CSW met with patient. CSW introduced self and explained role. CSW discussed PT/OT recommendations. Patient states is preference is to go home. He states he has the support of his significant other  in the home and her daughter that lives close. Patient states financially, he can ot afford to go to SNF. CSW informed, we certainly can make every effort to get placement but with no payor source, that would be challenging. Patient states understanding and again confirmed, his preference is to go home. All questions answered. Informed RNCM will follow up for [facility] needs.   TOC will continue to follow and assist with discharge planning.  Thurmond Butts, MSW, LCSW Clinical Social Worker     Expected Discharge Plan: Home w Home Health Services Barriers to Discharge: Continued Medical Work up   Patient Goals and CMS Choice Patient states their goals for this hospitalization and ongoing recovery are:: return home with fiance CMS Medicare.gov Compare Post Acute Care list provided to:: Patient Choice offered to / list presented to : Patient  Expected Discharge Plan and Services Expected Discharge Plan: [place] In-house Referral: Clinical Social Work Discharge Planning Services: CM Consult Post Acute Care Choice: [place] arrangements for the past 2 months: [place]: [employer] Date [place]: 07/11/21 Time [place]: 1657 Representative spoke with at [place]: [place] Arrangements/Services Living arrangements for the past 2 months: [place] with:: Self, Significant Other Patient language  and need for interpreter reviewed:: No Do you feel safe going back to the place where you live?: Yes      Need for Family Participation in Patient Care: Yes (Comment) Care giver support system in place?: Yes (comment) Current home services: DME (walker) Criminal Activity/Legal Involvement Pertinent to Current Situation/Hospitalization: No - Comment as needed  Activities of Daily Living      Permission Sought/Granted Permission sought to share information with : Family Supports Permission granted to share information with : Yes, Verbal Permission Granted  Share Information with NAME: Moise Boring  Permission granted to share info w AGENCY: [place]  Permission granted to share info w Relationship: sig/other  Permission granted to share info w Contact Information: [phone]  Emotional Assessment Appearance:: Appears stated age Attitude/Demeanor/Rapport: Engaged Affect (typically observed): Accepting, Pleasant, Appropriate Orientation: : Oriented to Self, Oriented to Place, Oriented to  Time, Oriented to Situation Alcohol / Substance Use: Not Applicable Psych Involvement: No (comment)  Admission diagnosis:  MSSA bacteremia [R78.81, B95.61] Patient Active Problem List   Diagnosis Date Noted   MSSA bacteremia 07/08/2021   Acute bacterial endocarditis    Heart block    Bacteremia    Hyponatremia 62/69/4854   Acute metabolic encephalopathy 62/70/3500   Joint pain 07/03/2021   Generalized weakness 07/03/2021   SIRS (systemic inflammatory response syndrome) ([place]) 07/03/2021   Hypokalemia 07/03/2021   Hyperglycemia 07/03/2021   Thrombocytopenia (HCC) 07/03/2021   Nausea vomiting and diarrhea 07/03/2021  Gout    PCP:  Pcp, No Pharmacy:  No Pharmacies Listed    Social Determinants of Health (SDOH) Interventions    Readmission Risk Interventions No flowsheet data found.

## 2021-07-13 NOTE — Progress Notes (Signed)
Inpatient Rehab Admissions Coordinator:   Per PT recommendations, pt was screened for CIR by Shann Medal, PT, DPT.  Note per CSW pt reports he is unable to afford cost of SNF, and CIR would be much more costly.  If he would be open to cost, would recommend CIR consult.  Otherwise would defer to [facility] for discharge planning needs.   Shann Medal, PT, DPT Admissions Coordinator [phone] 07/13/21  4:28 PM

## 2021-07-13 NOTE — Plan of Care (Signed)
  Problem: Fluid Volume: Goal: Hemodynamic stability will improve Outcome: Progressing   Problem: Clinical Measurements: Goal: Signs and symptoms of infection will decrease Outcome: Progressing   Problem: Respiratory: Goal: Ability to maintain adequate ventilation will improve Outcome: Progressing   

## 2021-07-13 NOTE — Progress Notes (Signed)
[facility] Progress Note   Subjective:   Continues to have excellent urine output with some improvement in sodium up to 125 today.  Appetite remains fairly good.  Overall feeling better.  Liver ultrasound with some signs of steatohepatitis.  Objective Vitals:   07/13/21 0330 07/13/21 0350 07/13/21 0718 07/13/21 1100  BP: (!) 136/57  (!) 127/52 (!) 104/46  Pulse: 77  77 77  Resp: (!) 21  19 18   Temp: 98.7 F (37.1 C)  (!) 97.5 F (36.4 C) 97.8 F (36.6 C)  TempSrc: Oral  Oral Oral  SpO2: 96%  93% 98%  Weight:  106.6 kg    Height:       Physical Exam Gen: appears comfortable sitting up in bed Eyes: anicteric, EOMI ENT: MMM Neck: supple, no JVD CV:  RRR, no murmur appreciated Abd: soft, obese Lungs: Bilateral chest rise with no increased work of breathing GU: no foley Extr:  1+ pedal  edema Neuro: nonfocal  Additional Objective Labs: Basic Metabolic Panel: Recent Labs  Lab 07/11/21 1323 07/12/21 0059 07/12/21 1633 07/13/21 0340  NA 123* 121* 124* 125*  K 4.5 4.3  --  4.4  CL 93* 92*  --  95*  CO2 24 25  --  25  GLUCOSE 251* 136*  --  215*  BUN 14 11  --  23*  CREATININE 1.02 0.96  --  0.91  CALCIUM 7.1* 7.1*  --  7.6*  PHOS 3.4 2.7  --  3.2   Liver Function Tests: Recent Labs  Lab 07/07/21 1213 07/09/21 1319 07/11/21 1323 07/12/21 0059 07/13/21 0340  AST 27  --   --   --   --   ALT 16  --   --   --   --   ALKPHOS 64  --   --   --   --   BILITOT 1.4*  --   --   --   --   PROT 6.8  --   --   --   --   ALBUMIN 1.9*   < > <1.5* <1.5* <1.5*   < > = values in this interval not displayed.   No results for input(s): LIPASE, AMYLASE in the last 168 hours. CBC: Recent Labs  Lab 07/07/21 0445 07/08/21 0628 07/08/21 1747 07/13/21 0340  WBC 14.4* 10.2 9.0 8.9  NEUTROABS 11.9* 8.3*  --   --   HGB 11.9* 10.7* 10.7* 9.1*  HCT 32.4* 30.2* 31.7* 27.6*  MCV 92.3 93.2 95.5 97.5  PLT 78* 84* 80* 132*   Blood Culture    Component Value  Date/Time   SDES BLOOD RIGHT WRIST 07/05/2021 0747   SDES BLOOD BLOOD RIGHT HAND 07/05/2021 0747   SPECREQUEST  07/05/2021 0747    BOTTLES DRAWN AEROBIC AND ANAEROBIC Blood Culture adequate volume   SPECREQUEST  07/05/2021 0747    BOTTLES DRAWN AEROBIC ONLY Blood Culture adequate volume   CULT  07/05/2021 0747    NO GROWTH 5 DAYS Performed at [facility], [address] [place], [place] [address]    CULT  07/05/2021 0747    NO GROWTH 5 DAYS Performed at [facility], [address] [place], [place] [address]    REPTSTATUS 07/10/2021 FINAL 07/05/2021 0747   REPTSTATUS 07/10/2021 FINAL 07/05/2021 0747    Cardiac Enzymes: No results for input(s): CKTOTAL, CKMB, CKMBINDEX, TROPONINI in the last 168 hours. CBG: Recent Labs  Lab 07/12/21 0640 07/12/21 1148 07/12/21 1619 07/12/21 2120 07/13/21 4128  GLUCAP 150* 197* 257* 194* 149*   Iron Studies: No results for input(s): IRON, TIBC, TRANSFERRIN, FERRITIN in the last 72 hours. @lablastinr3 @ Studies/Results: DG Shoulder Left  Result Date: 07/12/2021 CLINICAL DATA:  Acute left shoulder pain EXAM: LEFT SHOULDER - 2+ VIEW COMPARISON:  None. FINDINGS: There is no evidence of fracture or dislocation. Acromioclavicular degenerative changes. There is no evidence of severe arthropathy or other focal bone abnormality. Soft tissues are unremarkable. Atherosclerotic plaque of the aorta. IMPRESSION: Negative. Electronically Signed   By: Iven Finn M.D.   On: 07/12/2021 18:39   VAS [place] UPPER EXTREMITY VENOUS DUPLEX  Result Date: 07/12/2021 UPPER VENOUS STUDY  Patient Name:  Joshua Cole  Date of Exam:   07/11/2021 Medical Rec #: [id number]         Accession #:    [id number] Date of Birth: 09-30-1960        Patient Gender: M Patient Age:   60 years Exam Location:  [facility] Procedure:      VAS [place] UPPER EXTREMITY VENOUS DUPLEX Referring Phys: Cristopher Peru  --------------------------------------------------------------------------------  Indications: Patient states he is unable to raise arm Limitations: Patient somnolent and unable to keep head and arm turned. Comparison Study: No prior study Performing Technologist: Sharion Dove RVS  Examination Guidelines: A complete evaluation includes B-mode imaging, spectral Doppler, color Doppler, and power Doppler as needed of all accessible portions of each vessel. Bilateral testing is considered an integral part of a complete examination. Limited examinations for reoccurring indications may be performed as noted.  Right Findings: +----------+------------+---------+-----------+----------+-------+ RIGHT     CompressiblePhasicitySpontaneousPropertiesSummary +----------+------------+---------+-----------+----------+-------+ Subclavian               Yes       Yes                      +----------+------------+---------+-----------+----------+-------+  Left Findings: +----------+------------+---------+-----------+----------+--------------+ LEFT      CompressiblePhasicitySpontaneousProperties   Summary     +----------+------------+---------+-----------+----------+--------------+ IJV                                                 Not visualized +----------+------------+---------+-----------+----------+--------------+ Subclavian               Yes       Yes                             +----------+------------+---------+-----------+----------+--------------+ Brachial      Full                                                 +----------+------------+---------+-----------+----------+--------------+ Cephalic      Full                                                 +----------+------------+---------+-----------+----------+--------------+ Basilic       Full                                                 +----------+------------+---------+-----------+----------+--------------+  Summary:  Right: No evidence of superficial vein thrombosis in the upper extremity.  Left: No obvious evidence of deep vein thrombosis in the upper extremity. No evidence of superficial vein thrombosis in the upper extremity.  *See table(s) above for measurements and observations.  Diagnosing physician: Orlie Pollen Electronically signed by Orlie Pollen on 07/12/2021 at 2:35:00 PM.    Final    [place] EKG SITE RITE  Result Date: 07/12/2021 If Site Rite image not attached, placement could not be confirmed due to current cardiac rhythm.  US Abdomen Limited RUQ (LIVER/GB)  Result Date: 07/13/2021 CLINICAL DATA:  Abnormal LFTs. EXAM: ULTRASOUND ABDOMEN LIMITED RIGHT UPPER QUADRANT COMPARISON:  None. FINDINGS: Gallbladder: No gallstones or wall thickening visualized. No sonographic Murphy sign noted by sonographer. The gallbladder is predominantly contracted. There is adenomyomatosis of the gallbladder fundus. Common bile duct: Diameter: 5 mm Liver: There is diffuse increased liver echogenicity most commonly seen in the setting of fatty infiltration. Superimposed inflammation or fibrosis is not excluded. Clinical correlation is recommended. The liver is enlarged measuring approximately 20 cm in length. Portal vein is patent on color Doppler imaging with normal direction of blood flow towards the liver. Other: None. IMPRESSION: Enlarged fatty liver may represent steatohepatitis. Correlation with clinical exam and LFTs recommended. Electronically Signed   By: Anner Crete M.D.   On: 07/13/2021 00:14   Medications:   ceFAZolin (ANCEF) IV 2 g (07/13/21 0636)    aspirin EC  81 mg Oral Daily   Chlorhexidine Gluconate Cloth  6 each Topical Daily   folic acid  1 mg Oral Daily   furosemide  20 mg Oral BID   heparin  5,000 Units Subcutaneous Q8H   insulin aspart  0-15 Units Subcutaneous TID WC   insulin aspart  0-5 Units Subcutaneous QHS   insulin glargine-yfgn  23 Units Subcutaneous QHS   multivitamin  1  tablet Oral Daily   pantoprazole  40 mg Oral Daily   sodium chloride flush  10-40 mL Intracatheter Q12H   thiamine  100 mg Oral Daily   urea  15 g Oral BID    Assessment/Plan **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  ID following.  Panorex ok.  At [facility] for CTS consult -- no surgery, IV abx should cure.    **Hyponatremia: Thought to be related to alcohol use with low solute intake but has failed to improve with IV hydration.  TSH not low, no signs of adrenal insufficiency, no paraproteinemia.  Seems that volume excess is playing a role but ADH may be overactive.  Repeat urine studies on 10/31 with fairly good urine osmolality 179 and urine sodium of 26.  Not high enough to suggest overt SIADH but could represent some degree of heart failure playing a role. Will continue with lasix 20mg  BID for now.  We will also add urea 15 g twice daily which will help with diuresis as well as hyponatremia.   **Anemia: Hemoglobin lower 9.1 today.  Continue to monitor   **EtOH overuse: thiamine/folate.     **DM: new dx, A1c was 7.8. Per primary.    **thrombocytopenia: Platelets have improved now 130.  Likely related to alcohol use  **Hypoalbuminemia: severely low <1.5. Nutrition playing a role but fairly profound.  Liver ultrasound is suggestive of some Liver disease.   Will follow, call with concerns.

## 2021-07-13 NOTE — Progress Notes (Signed)
Physical Therapy Treatment Patient Details Name: Joshua Cole MRN: [id number] DOB: 07-02-1961 Today's Date: 07/13/2021   History of Present Illness Pt is a 60 y.o. male admitted 07/03/21 with c/o weakness, joint pain, confusion, nausea/vomiting, fever; of note, pt recently received flu shot and COVID booster on 10/14. Workup for hyponatremia, acute metabolic encephalopathy of unclear etiology. Brain MRI negative for acute injury. Blood cultures (+) staph aureus. Pelvic MRI 10/24 showed bilateral hip joint effusions; edema consistent with myositis. 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 Course complicated by development of heart block. Transfer to [facility] on 10/27 for cardiothoracic sx consult. Chest CTA negative for PE, concern for multiple ill-defined lesions. PMH includes gout, ETOH use.    PT Comments    Pt is progressing slowly with mobility. Today's session focused on transfers, ambulation, and caregiver education. Pt required Mod to Max assist to stand and Min guard to ambulate 24 feet in room. Limited by fatigue, general weakness, and pain in B hips. Recommended IPR, but pt would like to return home. Daughter states she will be with him nearly 24/7 when he gets home. Educated daughter on how to transfer pt from sit to stand safely. Pt would benefit from further acute care PT services while he is here to continue to address functional mobility goals, increase activity tolerance, and provide further caregiver education to ensure safety when he returns home. Since pt declines post-acute rehab will need max HH services upon return home.   Recommendations for follow up therapy are one component of a multi-disciplinary discharge planning process, led by the attending physician.  Recommendations may be updated based on patient status, additional functional criteria and insurance authorization.  Follow Up Recommendations  Acute inpatient rehab (3hours/day) (Pt plans to return home.  Will need home health PT.)     Assistance Recommended at Discharge Frequent or constant Supervision/Assistance  Equipment Recommendations  TBD - may need WC and/or 3-in-1.     Recommendations for Other Services       Precautions / Restrictions Precautions Precautions: Fall Restrictions Weight Bearing Restrictions: No     Mobility  Bed Mobility               General bed mobility comments: pt received in recliner    Transfers Overall transfer level: Needs assistance Equipment used: Rolling walker (2 wheels) Transfers: Sit to/from Stand Sit to Stand: Mod assist;Max assist           General transfer comment: stood from recliner with RW and Mod-max assist for trunk elevation and stability. Stood 3 times total to educate daughter how to assist with transfers. Tried to stand a 4th time but patient was not able due to fatigue.    Ambulation/Gait Ambulation/Gait assistance: Min guard Gait Distance (Feet): 24 Feet Assistive device: Rolling walker (2 wheels) Gait Pattern/deviations: Step-to pattern;Decreased stride length;Narrow base of support Gait velocity: decreased   General Gait Details: Ambulated in room with RW and min gaurd for safety and balance. Pt limited by fatigue and pain in B hips.   Stairs             Wheelchair Mobility    Modified Rankin (Stroke Patients Only)       Balance Overall balance assessment: Needs assistance Sitting-balance support: Feet supported Sitting balance-Leahy Scale: Fair Sitting balance - Comments: Pt able to sit at edge of recliner, did not challenge balance   Standing balance support: Bilateral upper extremity supported Standing balance-Leahy Scale: Poor Standing balance comment: B UE support and  min gaurd during static standing and ambulation. 2 times loss of balance while ambulating, pt able to self-correct.                            Cognition Arousal/Alertness: Awake/alert Behavior During  Therapy: WFL for tasks assessed/performed Overall Cognitive Status: Impaired/Different from baseline Area of Impairment: Attention;Awareness                   Current Attention Level: Sustained;Selective       Awareness: Emergent   General Comments: Cognition not formally assessed but pt able to follow commands, not able to divide attention between multiple people.        Exercises      General Comments        Pertinent Vitals/Pain Pain Assessment: 0-10 Pain Score: 7  Pain Location: B hips, back Pain Descriptors / Indicators: Aching Pain Intervention(s): Monitored during session;Limited activity within patient's tolerance    Home Living                          Prior Function            PT Goals (current goals can now be found in the care plan section) Acute Rehab PT Goals Patient Stated Goal: to get stronger PT Goal Formulation: With patient Time For Goal Achievement: 07/25/21 Potential to Achieve Goals: Good Progress towards PT goals: Progressing toward goals    Frequency    Min 3X/week      PT Plan Discharge plan needs to be updated    Co-evaluation              AM-PAC PT "6 Clicks" Mobility   Outcome Measure  Help needed turning from your back to your side while in a flat bed without using bedrails?: A Little Help needed moving from lying on your back to sitting on the side of a flat bed without using bedrails?: A Lot Help needed moving to and from a bed to a chair (including a wheelchair)?: A Lot Help needed standing up from a chair using your arms (e.g., wheelchair or bedside chair)?: A Lot Help needed to walk in hospital room?: A Little Help needed climbing 3-5 steps with a railing? : A Lot 6 Click Score: 14    End of Session Equipment Utilized During Treatment: Gait belt Activity Tolerance: Patient tolerated treatment well;Patient limited by fatigue Patient left: in chair;with call bell/phone within reach Nurse  Communication: Mobility status PT Visit Diagnosis: Unsteadiness on feet (R26.81);Other abnormalities of gait and mobility (R26.89);Pain Pain - part of body: Hip     Time: 1610-9604 PT Time Calculation (min) (ACUTE ONLY): 31 min  Charges:  $Therapeutic Activity: 23-37 mins                     Brandon Melnick, SPT  Brandon Melnick 07/13/2021, 1:41 PM

## 2021-07-13 NOTE — Progress Notes (Signed)
[facility] for Infectious Disease    Date of Admission:  07/08/2021   Total days of antibiotics 11           ID: Login Joshua Cole is a 60 y.o. male with MSSA TV endocarditis, concern for other nidus of infection Active Problems:   Acute metabolic encephalopathy   Generalized weakness   Bacteremia   Acute bacterial endocarditis   Heart block   MSSA bacteremia    Subjective: Still having difficulty with pain to hips. Also difficulty with raising left arm above shoulder, extension. Plain films unrevealing.denies fever. Had picc line placed without difficulty  ROS: is otherwise negative  Medications:   aspirin EC  81 mg Oral Daily   Chlorhexidine Gluconate Cloth  6 each Topical Daily   folic acid  1 mg Oral Daily   furosemide  20 mg Oral BID   heparin  5,000 Units Subcutaneous Q8H   insulin aspart  0-15 Units Subcutaneous TID WC   insulin aspart  0-5 Units Subcutaneous QHS   insulin glargine-yfgn  23 Units Subcutaneous QHS   multivitamin  1 tablet Oral Daily   pantoprazole  40 mg Oral Daily   sodium chloride flush  10-40 mL Intracatheter Q12H   thiamine  100 mg Oral Daily   urea  15 g Oral BID    Objective: Vital signs in last 24 hours: Temp:  [97.5 F (36.4 C)-100 F (37.8 C)] 97.8 F (36.6 C) (11/01 1100) Pulse Rate:  [76-80] 77 (11/01 1100) Resp:  [18-21] 18 (11/01 1100) BP: (104-139)/(46-62) 104/46 (11/01 1100) SpO2:  [93 %-98 %] 98 % (11/01 1100) Weight:  [106.6 kg] 106.6 kg (11/01 0350) Physical Exam  Constitutional: He is oriented to person, place, and time. He appears well-developed and well-nourished. No distress.  HENT:  Mouth/Throat: Oropharynx is clear and moist. No oropharyngeal exudate.  Cardiovascular: Normal rate, regular rhythm and normal heart sounds. Exam reveals no gallop and no friction rub.  No murmur heard.  Pulmonary/Chest: Effort normal and breath sounds normal. No respiratory distress. He has no wheezes.  Abdominal: Soft. Bowel  sounds are normal. He exhibits no distension. There is no tenderness.  Lymphadenopathy:  He has no cervical adenopathy.  Neurological: He is alert and oriented to person, place, and time. +weakness with external rotation to left arm Skin: Skin is warm and dry. No rash noted. No erythema.  Psychiatric: He has a normal mood and affect. His behavior is normal.   Lab Results Recent Labs    07/12/21 0059 07/12/21 1633 07/13/21 0340  WBC  --   --  8.9  HGB  --   --  9.1*  HCT  --   --  27.6*  NA 121* 124* 125*  K 4.3  --  4.4  CL 92*  --  95*  CO2 25  --  25  BUN 11  --  23*  CREATININE 0.96  --  0.91   Liver Panel Recent Labs    07/12/21 0059 07/13/21 0340  ALBUMIN <1.5* <1.5*   Lab Results  Component Value Date   ESRSEDRATE 82 (H) 07/08/2021    Microbiology:  Studies/Results: DG Shoulder Left  Result Date: 07/12/2021 CLINICAL DATA:  Acute left shoulder pain EXAM: LEFT SHOULDER - 2+ VIEW COMPARISON:  None. FINDINGS: There is no evidence of fracture or dislocation. Acromioclavicular degenerative changes. There is no evidence of severe arthropathy or other focal bone abnormality. Soft tissues are unremarkable. Atherosclerotic plaque of the aorta. IMPRESSION: Negative.  Electronically Signed   By: Iven Finn M.D.   On: 07/12/2021 18:39   [place] EKG SITE RITE  Result Date: 07/12/2021 If Site Rite image not attached, placement could not be confirmed due to current cardiac rhythm.  US Abdomen Limited RUQ (LIVER/GB)  Result Date: 07/13/2021 CLINICAL DATA:  Abnormal LFTs. EXAM: ULTRASOUND ABDOMEN LIMITED RIGHT UPPER QUADRANT COMPARISON:  None. FINDINGS: Gallbladder: No gallstones or wall thickening visualized. No sonographic Murphy sign noted by sonographer. The gallbladder is predominantly contracted. There is adenomyomatosis of the gallbladder fundus. Common bile duct: Diameter: 5 mm Liver: There is diffuse increased liver echogenicity most commonly seen in the setting of fatty  infiltration. Superimposed inflammation or fibrosis is not excluded. Clinical correlation is recommended. The liver is enlarged measuring approximately 20 cm in length. Portal vein is patent on color Doppler imaging with normal direction of blood flow towards the liver. Other: None. IMPRESSION: Enlarged fatty liver may represent steatohepatitis. Correlation with clinical exam and LFTs recommended. Electronically Signed   By: Anner Crete M.D.   On: 07/13/2021 00:14     Assessment/Plan: MSSA bacteremia with native TV endocarditis = continue on cefazolin with plan to treat for 6 wks. Concern for other nidus of infection including left shoulder pain (which is new).unclear if just mechanical injury/rotator cuff injury vs. Concern for septic joint. Will get mri with contrast.  IDDM = may need continued adjustments as random BS this am  >200  Hip pain = suspect its chronic. Unclear if fluid about right hip needs to be aspirated at this time. Discussion with IR is that imaging should be repeated should we pursue aspiratoin.  [facility] for Infectious Diseases Pager: [phone]  07/13/2021, 2:05 PM

## 2021-07-13 NOTE — Progress Notes (Signed)
Radiology team asked to evaluate patient for possible right hip fluid collection aspiration. Current MRI imaging 41 days old; Radiology team would need current imaging to make a decision. After discussion with Dr. Baxter Flattery 07/12/21 decision was made to hold off on procedure and the order was cancelled. Order for hip aspiration placed today by Dr. Gardiner Rhyme. Dr. Gardiner Rhyme notified via Epic chat of discussion with Dr. Baxter Flattery, decision to cancel order, and need for updated imaging if the team wishes to pursue hip aspiration. Message acknowledged by Dr. Gardiner Rhyme.   No procedure planned; please notify Radiology if repeat imaging will be ordered.  Soyla Dryer, [place] [phone] 07/13/2021, 1:07 PM

## 2021-07-13 NOTE — Progress Notes (Signed)
Inpatient Diabetes Program Recommendations  AACE/ADA: New Consensus Statement on Inpatient Glycemic Control   Target Ranges:  Prepandial:   less than 140 mg/dL      Peak postprandial:   less than 180 mg/dL (1-2 hours)      Critically ill patients:  140 - 180 mg/dL   Results for RAVINDER, LUKEHART (MRN [id number]) as of 07/13/2021 10:47  Ref. Range 07/12/2021 06:40 07/12/2021 11:48 07/12/2021 16:19 07/12/2021 21:20 07/13/2021 06:40  Glucose-Capillary Latest Ref Range: 70 - 99 mg/dL 150 (H) 197 (H) 257 (H) 194 (H) 149 (H)   Review of Glycemic Control  Admit with:  MSSA bacteremia Sepsis  Acute hypoxic respiratory failure New diagnosis diabetes mellitus with hyperglycemia (seen by diabetes coordinator at [facility] on 10/26 and 10/27)     Current Orders: Semglee 23 units QHS, Novolog 0-15 units TID, NOvolog 0-5 units QHS  Inpatient Diabetes Program Recommendations:     Insulin: Post prandial glucose is consistently elevated.  Please consider ordering Novolog 4 units TID with meals for meal coverage if patient eats at least 50% of meals.   Thanks, Barnie Alderman, RN, MSN, CDE Diabetes Coordinator Inpatient Diabetes Program [phone] (Team Pager from 8am to 5pm)

## 2021-07-13 NOTE — Progress Notes (Signed)
Progress Note  Patient Name: Joshua Cole Date of Encounter: 07/13/2021  [facility] Cardiologist: Kathlyn Sacramento, MD   Subjective   Denies any chest pain, dyspnea, or lightheadedness  Inpatient Medications    Scheduled Meds:  aspirin EC  81 mg Oral Daily   Chlorhexidine Gluconate Cloth  6 each Topical Daily   folic acid  1 mg Oral Daily   furosemide  20 mg Oral BID   heparin  5,000 Units Subcutaneous Q8H   insulin aspart  0-15 Units Subcutaneous TID WC   insulin aspart  0-5 Units Subcutaneous QHS   insulin glargine-yfgn  23 Units Subcutaneous QHS   multivitamin  1 tablet Oral Daily   pantoprazole  40 mg Oral Daily   sodium chloride flush  10-40 mL Intracatheter Q12H   thiamine  100 mg Oral Daily   urea  15 g Oral BID   Continuous Infusions:   ceFAZolin (ANCEF) IV 2 g (07/13/21 0636)   PRN Meds: acetaminophen, LORazepam, morphine injection, nitroGLYCERIN, ondansetron (ZOFRAN) IV, oxyCODONE, sodium chloride flush   Vital Signs    Vitals:   07/12/21 2321 07/13/21 0330 07/13/21 0350 07/13/21 0718  BP: 132/62 (!) 136/57  (!) 127/52  Pulse: 76 77  77  Resp: 20 (!) 21  19  Temp: 100 F (37.8 C) 98.7 F (37.1 C)  (!) 97.5 F (36.4 C)  TempSrc: Oral Oral  Oral  SpO2: 95% 96%  93%  Weight:   106.6 kg   Height:        Intake/Output Summary (Last 24 hours) at 07/13/2021 0819 Last data filed at 07/13/2021 0865 Gross per 24 hour  Intake 1680.8 ml  Output 3925 ml  Net -2244.2 ml    Last 3 Weights 07/13/2021 07/12/2021 07/12/2021  Weight (lbs) 235 lb 0.2 oz 239 lb 6.7 oz 238 lb 1.6 oz  Weight (kg) 106.6 kg 108.6 kg 108 kg      Telemetry    AV dissociation with junctional rhythm rates 70-80s - Personally Reviewed  ECG    No new EKG- Personally Reviewed  Physical Exam   GEN: No acute distress.   Neck: No JVD Cardiac: RRR, no murmurs, rubs, or gallops.  Respiratory: Clear to auscultation bilaterally. GI: Soft, nontender, non-distended  MS: No edema;  No deformity. Neuro:  Nonfocal  Psych: Normal affect   Labs    High Sensitivity Troponin:  No results for input(s): TROPONINIHS in the last 720 hours.   Chemistry Recent Labs  Lab 07/07/21 1213 07/08/21 0628 07/11/21 1323 07/12/21 0059 07/12/21 1633 07/13/21 0340  NA  --    < > 123* 121* 124* 125*  K  --    < > 4.5 4.3  --  4.4  CL  --    < > 93* 92*  --  95*  CO2  --    < > 24 25  --  25  GLUCOSE  --    < > 251* 136*  --  215*  BUN  --    < > 14 11  --  23*  CREATININE  --    < > 1.02 0.96  --  0.91  CALCIUM  --    < > 7.1* 7.1*  --  7.6*  PROT 6.8  --   --   --   --   --   ALBUMIN 1.9*   < > <1.5* <1.5*  --  <1.5*  AST 27  --   --   --   --   --  ALT 16  --   --   --   --   --   ALKPHOS 64  --   --   --   --   --   BILITOT 1.4*  --   --   --   --   --   GFRNONAA  --    < > >60 >60  --  >60  ANIONGAP  --    < > 6 4*  --  5   < > = values in this interval not displayed.     Lipids  Recent Labs  Lab 07/08/21 0628  CHOL 96  TRIG 102  HDL <10*  LDLCALC NOT CALCULATED  CHOLHDL NOT CALCULATED     Hematology Recent Labs  Lab 07/08/21 0628 07/08/21 1747 07/13/21 0340  WBC 10.2 9.0 8.9  RBC 3.24* 3.32* 2.83*  HGB 10.7* 10.7* 9.1*  HCT 30.2* 31.7* 27.6*  MCV 93.2 95.5 97.5  MCH 33.0 32.2 32.2  MCHC 35.4 33.8 33.0  RDW 12.8 12.8 12.5  PLT 84* 80* 132*    Thyroid  Recent Labs  Lab 07/07/21 1213  TSH 0.162*     BNP No results for input(s): BNP, PROBNP in the last 168 hours.   DDimer  No results for input(s): DDIMER in the last 168 hours.    Radiology    DG Shoulder Left  Result Date: 07/12/2021 CLINICAL DATA:  Acute left shoulder pain EXAM: LEFT SHOULDER - 2+ VIEW COMPARISON:  None. FINDINGS: There is no evidence of fracture or dislocation. Acromioclavicular degenerative changes. There is no evidence of severe arthropathy or other focal bone abnormality. Soft tissues are unremarkable. Atherosclerotic plaque of the aorta. IMPRESSION: Negative.  Electronically Signed   By: Iven Finn M.D.   On: 07/12/2021 18:39   VAS [place] UPPER EXTREMITY VENOUS DUPLEX  Result Date: 07/12/2021 UPPER VENOUS STUDY  Patient Name:  Joshua Cole  Date of Exam:   07/11/2021 Medical Rec #: [id number]         Accession #:    [id number] Date of Birth: [birth year]        Patient Gender: M Patient Age:   60 years Exam Location:  [facility] Procedure:      VAS [place] UPPER EXTREMITY VENOUS DUPLEX Referring Phys: Cristopher Peru --------------------------------------------------------------------------------  Indications: Patient states he is unable to raise arm Limitations: Patient somnolent and unable to keep head and arm turned. Comparison Study: No prior study Performing Technologist: Sharion Dove RVS  Examination Guidelines: A complete evaluation includes B-mode imaging, spectral Doppler, color Doppler, and power Doppler as needed of all accessible portions of each vessel. Bilateral testing is considered an integral part of a complete examination. Limited examinations for reoccurring indications may be performed as noted.  Right Findings: +----------+------------+---------+-----------+----------+-------+ RIGHT     CompressiblePhasicitySpontaneousPropertiesSummary +----------+------------+---------+-----------+----------+-------+ Subclavian               Yes       Yes                      +----------+------------+---------+-----------+----------+-------+  Left Findings: +----------+------------+---------+-----------+----------+--------------+ LEFT      CompressiblePhasicitySpontaneousProperties   Summary     +----------+------------+---------+-----------+----------+--------------+ IJV                                                 Not visualized +----------+------------+---------+-----------+----------+--------------+  Subclavian               Yes       Yes                              +----------+------------+---------+-----------+----------+--------------+ Brachial      Full                                                 +----------+------------+---------+-----------+----------+--------------+ Cephalic      Full                                                 +----------+------------+---------+-----------+----------+--------------+ Basilic       Full                                                 +----------+------------+---------+-----------+----------+--------------+  Summary:  Right: No evidence of superficial vein thrombosis in the upper extremity.  Left: No obvious evidence of deep vein thrombosis in the upper extremity. No evidence of superficial vein thrombosis in the upper extremity.  *See table(s) above for measurements and observations.  Diagnosing physician: Orlie Pollen Electronically signed by Orlie Pollen on 07/12/2021 at 2:35:00 PM.    Final    [place] EKG SITE RITE  Result Date: 07/12/2021 If Site Rite image not attached, placement could not be confirmed due to current cardiac rhythm.  US Abdomen Limited RUQ (LIVER/GB)  Result Date: 07/13/2021 CLINICAL DATA:  Abnormal LFTs. EXAM: ULTRASOUND ABDOMEN LIMITED RIGHT UPPER QUADRANT COMPARISON:  None. FINDINGS: Gallbladder: No gallstones or wall thickening visualized. No sonographic Murphy sign noted by sonographer. The gallbladder is predominantly contracted. There is adenomyomatosis of the gallbladder fundus. Common bile duct: Diameter: 5 mm Liver: There is diffuse increased liver echogenicity most commonly seen in the setting of fatty infiltration. Superimposed inflammation or fibrosis is not excluded. Clinical correlation is recommended. The liver is enlarged measuring approximately 20 cm in length. Portal vein is patent on color Doppler imaging with normal direction of blood flow towards the liver. Other: None. IMPRESSION: Enlarged fatty liver may represent steatohepatitis. Correlation with clinical  exam and LFTs recommended. Electronically Signed   By: Anner Crete M.D.   On: 07/13/2021 00:14    Cardiac Studies   TEE 07/07/2021: 1. Left ventricular ejection fraction, by estimation, is 55 to 60%. The  left ventricle has normal function.   2. Right ventricular systolic function is normal. The right ventricular  size is normal.   3. No left atrial/left atrial appendage thrombus was detected.   4. The mitral valve is normal in structure. Mild mitral valve  regurgitation.   5. There is a mobile mass attached to the tricuspid valve (clip 132)  consistent with a vegetation given the current clinical context.. The  tricuspid valve is degenerative.   6. The aortic valve is tricuspid. Aortic valve regurgitation is not  visualized.   Conclusion(s)/Recommendation(s): Findings are concerning for  vegetation/infective endocarditis as detailed above.  __________   2D echo 07/04/2021:  1. Left ventricular ejection fraction, by estimation, is  55 to 60%. The  left ventricle has normal function. Left ventricular endocardial border  not optimally defined to evaluate regional wall motion. There is mild left  ventricular hypertrophy. Left  ventricular diastolic parameters are indeterminate.   2. Right ventricular systolic function is normal. The right ventricular  size is normal. Tricuspid regurgitation signal is inadequate for assessing  PA pressure.   3. Left atrial size was mildly dilated.   4. Right atrial size was mildly dilated.   5. The mitral valve is normal in structure. No evidence of mitral valve  regurgitation. No evidence of mitral stenosis.   6. The aortic valve is normal in structure. Aortic valve regurgitation is  not visualized. Mild aortic valve sclerosis is present, with no evidence  of aortic valve stenosis.   7. Aortic dilatation noted. There is borderline dilatation of the aortic  root and of the ascending aorta, measuring 38 mm.   8. The tricuspid was not well  visualized. However, there is likely a  mobile vergetation noted on the short axis images. Recommend a TEE.  Patient Profile     60 y.o. male  with gout, alcohol use, bilateral THA, and poor dental hygiene who was admitted to [facility] on 07/03/2021 with MSSA bacteremia and found to have a tricuspid valve vegetation complicated by complete heart block with good escape rhythm who is being seen 07/08/2021 for the evaluation of the above.  Assessment & Plan    MSSA bacteremia with tricuspid valve vegetation with acute metabolic encephalopathy: -- Continue IV Ancef.  PICC line placed -- Appreciate ID recs: recommend IR consult to determinr if R hip fluid collection can be aspirated -- Mental status improved, suspected to be in the setting of his bacteremia  -- Transferred to [facility] for cardiothoracic surgery evaluation.  Seen by Dr.    Complete heart block: In the setting of MSSA bacteremia with tricuspid valve vegetation.  -- Evaluated by EP (while at [facility]) with no current indication for venous temp wire at this time, given good escape rhythm -- EP recommended pacemaker at time of valve replacement.  We will touch base with EP since not going for surgery -- Avoiding AV nodal blocking agents    Respiratory failure: Stable, weaned to RA with stable sats. -- CTA chest negative for PE with concern for multiple ill-defined lesions   Hyponatremia: improving, 125 today. Suspected in the setting of ETOH use -- Nephrology following, appreciate recs -- fluid restriction  -- continue lasix 20mg  BID   Thrombocytopenia: Improving, 57>>78>>84>>132 -- evaluated by oncology  -- Suspected to be in the setting of alcohol use   Alcohol use: -- Cessation advised    DM: Newly diagnosed  with A1c 7.8 --Evaluated by diabetic coordinator, appreciate recs: on semglee 23 units QHS + SSI --Needs outpatient follow up with PCP   Arthralgias: -- Evaluated by orthopedics at [facility] -- ID recommending hip  aspiration of joint effusion, will discuss with IR -- Continue previously ordered PRN oxycodone and morphine   Left arm swelling: Duplex negative for DVT  Consults:  Oncology, ID, Ortho, EP, Nephrology   For questions or updates, please contact [place] Please consult [email or web address] for contact info under   Signed, Donato Heinz, MD  07/13/2021, 8:19 AM

## 2021-07-14 ENCOUNTER — Inpatient Hospital Stay (HOSPITAL_COMMUNITY): Payer: Self-pay

## 2021-07-14 LAB — BASIC METABOLIC PANEL
Anion gap: 5 (ref 5–15)
BUN: 64 mg/dL — ABNORMAL HIGH (ref 6–20)
CO2: 25 mmol/L (ref 22–32)
Calcium: 7.4 mg/dL — ABNORMAL LOW (ref 8.9–10.3)
Chloride: 94 mmol/L — ABNORMAL LOW (ref 98–111)
Creatinine, Ser: 1.16 mg/dL (ref 0.61–1.24)
GFR, Estimated: >60 mL/min (ref >60)
Glucose, Bld: 255 mg/dL — ABNORMAL HIGH (ref 70–99)
Potassium: 5 mmol/L (ref 3.5–5.1)
Sodium: 124 mmol/L — ABNORMAL LOW (ref 135–145)

## 2021-07-14 LAB — HEMOGLOBIN AND HEMATOCRIT, BLOOD
HCT: 15.7 % — ABNORMAL LOW (ref 39.0–52.0)
Hemoglobin: 5.2 g/dL — CL (ref 13.0–17.0)

## 2021-07-14 LAB — RENAL FUNCTION PANEL
Albumin: <1.5 g/dL — ABNORMAL LOW (ref 3.5–5.0)
Anion gap: 6 (ref 5–15)
BUN: 47 mg/dL — ABNORMAL HIGH (ref 6–20)
CO2: 24 mmol/L (ref 22–32)
Calcium: 7.2 mg/dL — ABNORMAL LOW (ref 8.9–10.3)
Chloride: 91 mmol/L — ABNORMAL LOW (ref 98–111)
Creatinine, Ser: 1.1 mg/dL (ref 0.61–1.24)
GFR, Estimated: >60 mL/min (ref >60)
Glucose, Bld: 270 mg/dL — ABNORMAL HIGH (ref 70–99)
Phosphorus: 3.3 mg/dL (ref 2.5–4.6)
Potassium: 5.1 mmol/L (ref 3.5–5.1)
Sodium: 121 mmol/L — ABNORMAL LOW (ref 135–145)

## 2021-07-14 LAB — PREALBUMIN: Prealbumin: 5.9 mg/dL — ABNORMAL LOW (ref 18–38)

## 2021-07-14 LAB — GLUCOSE, CAPILLARY
Glucose-Capillary: 276 mg/dL — ABNORMAL HIGH (ref 70–99)
Glucose-Capillary: 221 mg/dL — ABNORMAL HIGH (ref 70–99)
Glucose-Capillary: 284 mg/dL — ABNORMAL HIGH (ref 70–99)
Glucose-Capillary: 248 mg/dL — ABNORMAL HIGH (ref 70–99)

## 2021-07-14 LAB — OCCULT BLOOD X 1 CARD TO LAB, STOOL: Fecal Occult Bld: POSITIVE — AB

## 2021-07-14 LAB — PREPARE RBC (CROSSMATCH)

## 2021-07-14 LAB — ABO/RH: ABO/RH(D): O POS

## 2021-07-14 IMAGING — MR MR SHOULDER*L* WO/W CM
4 of 9 series · 18 of 40 positions shown · IV contrast (gadavist)
Comparison: Left shoulder radiograph [DATE]

CLINICAL DATA: Left shoulder pain

EXAM:
MRI OF THE LEFT SHOULDER WITHOUT AND WITH CONTRAST
TECHNIQUE: Multiplanar, multisequence MR imaging of the left shoulder was
performed before and after the administration of intravenous
contrast.
CONTRAST:  10mL GADAVIST GADOBUTROL 1 MMOL/ML IV SOLN

[Series 2: T2 fat-sat · axial · 4.0mm · 0.27mm/px · z∈[-58,+37]mm · 5 of 21 slices shown (1 of 3)]
[im 1/21]
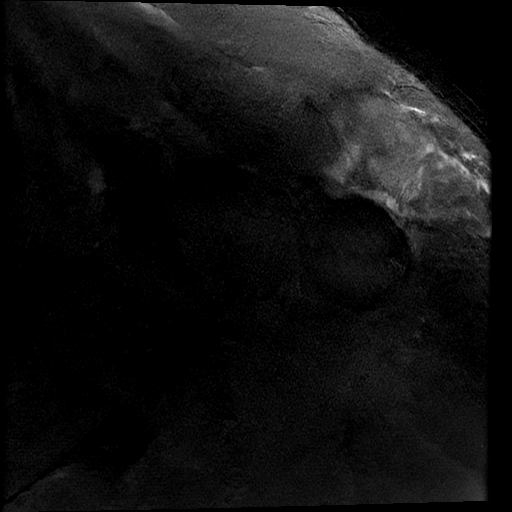
[im 6/21]
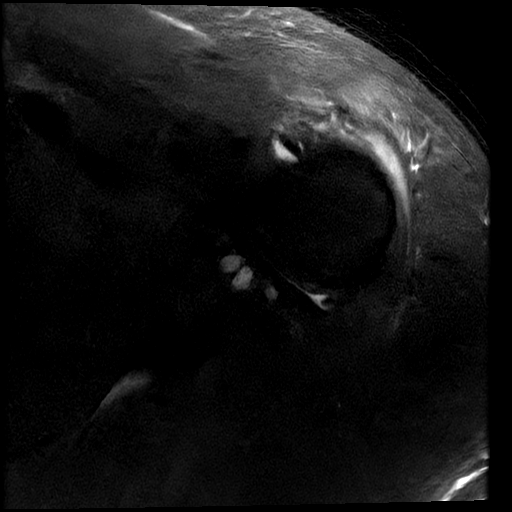
[im 11/21]
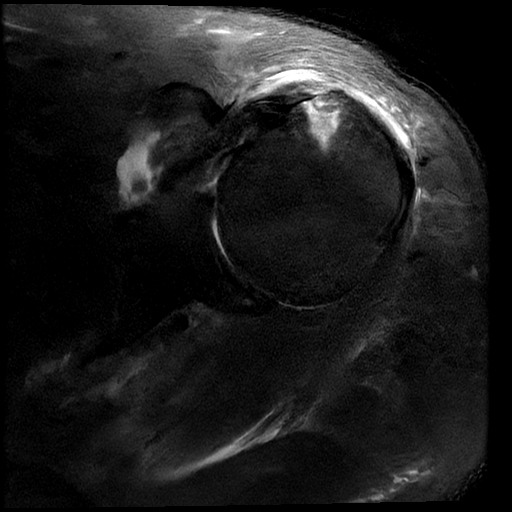
[im 16/21]
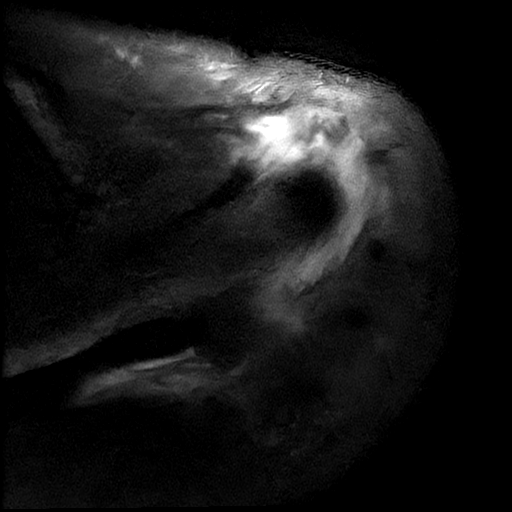
[im 21/21]
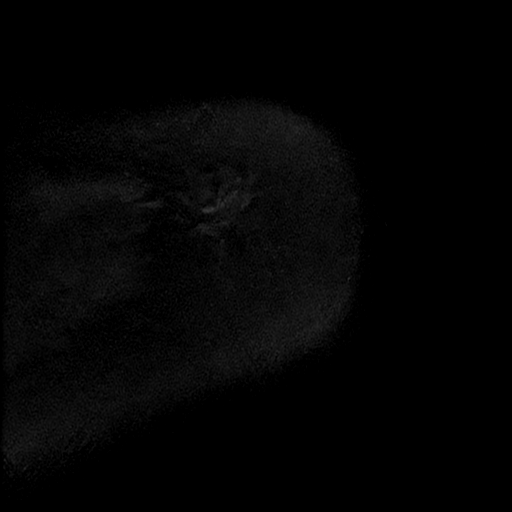

[Series 3: T2 fat-sat · sagittal · 4.0mm · 0.27mm/px · 5 of 21 slices shown (2 of 3)]
[im 1/21]
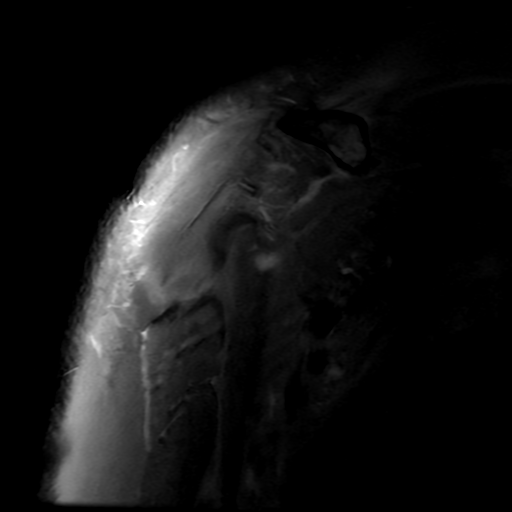
[im 6/21]
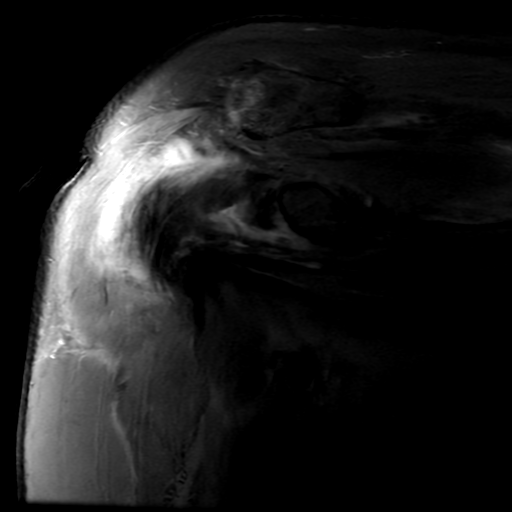
[im 11/21]
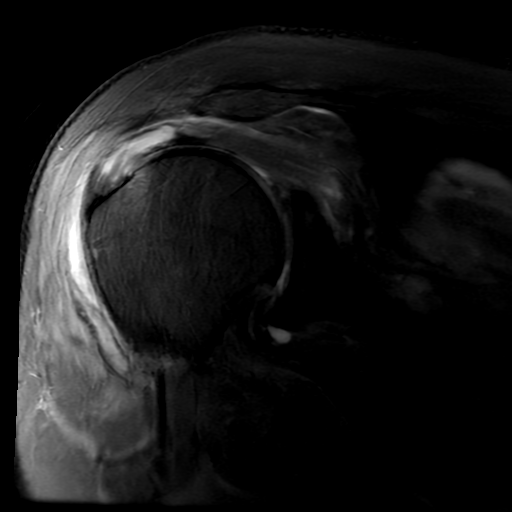
[im 16/21]
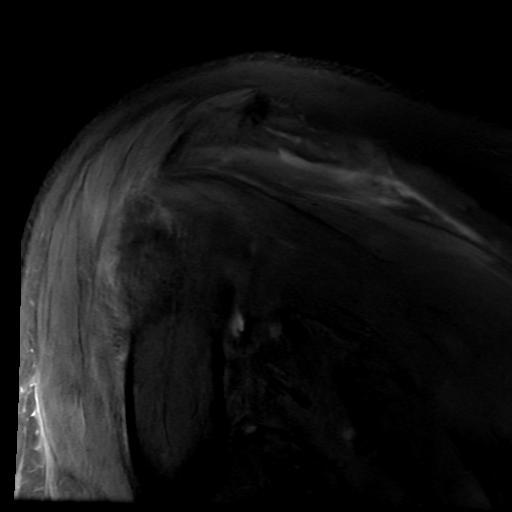
[im 21/21]
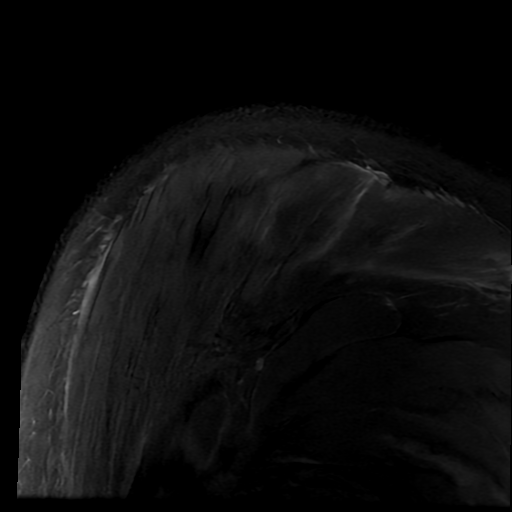

[Series 4: PD fat-sat · sagittal · 4.0mm · 0.27mm/px · 5 of 21 slices shown]
[im 1/21]
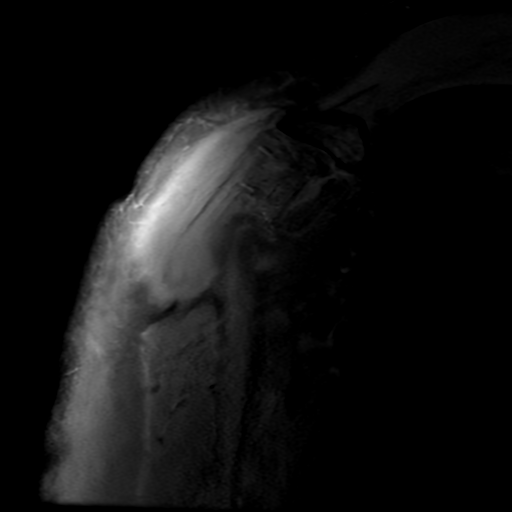
[im 6/21]
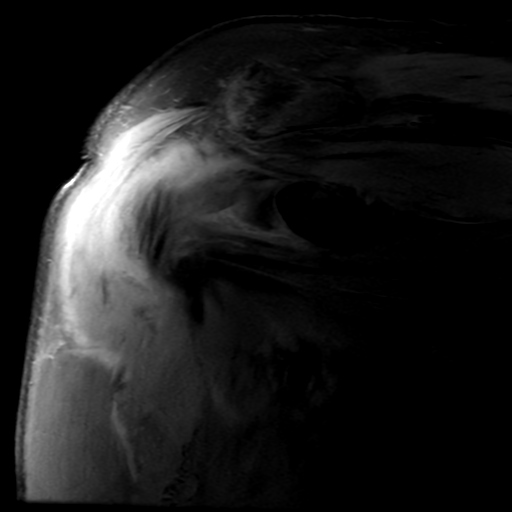
[im 11/21]
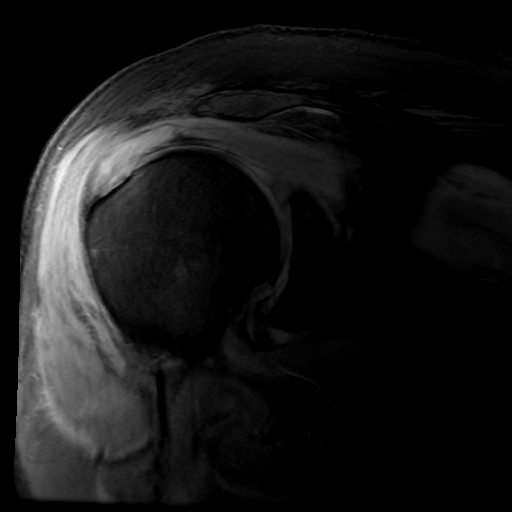
[im 16/21]
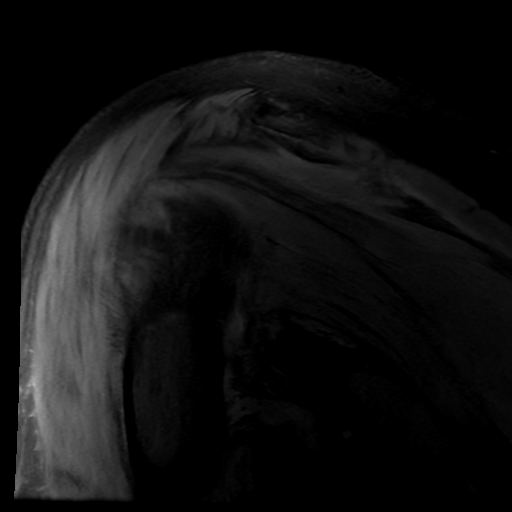
[im 21/21]
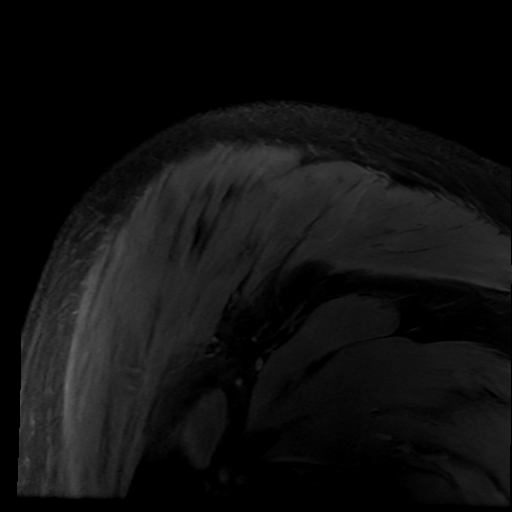

[Series 5: T2 fat-sat · oblique · 4.0mm · 0.31mm/px · 3 of 21 slices shown (3 of 3)]
[im 1/21]
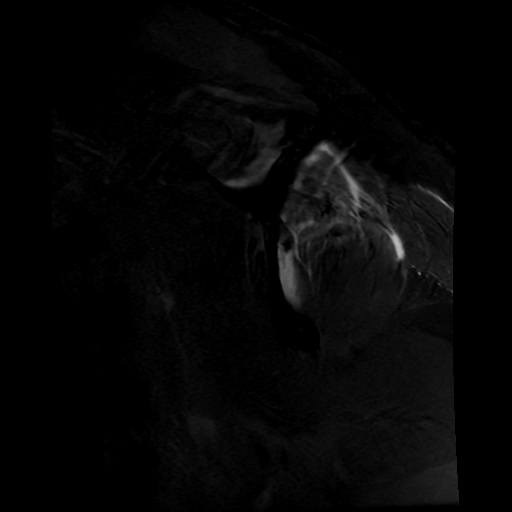
[im 11/21]
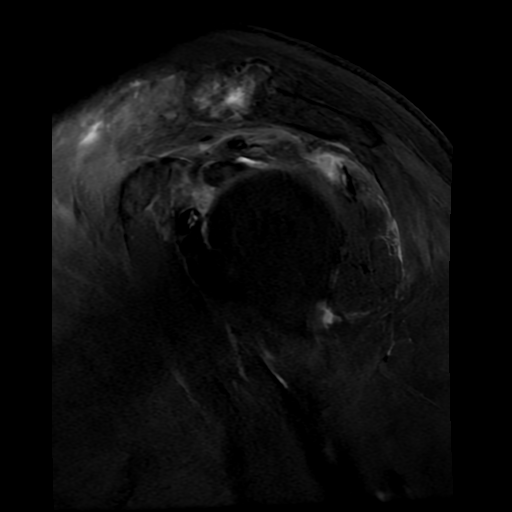
[im 21/21]
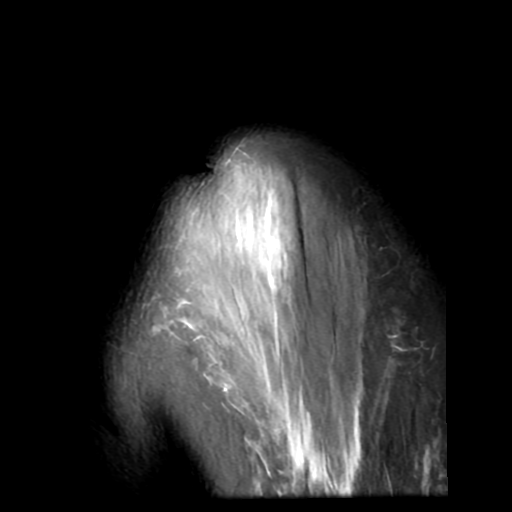

[18 of 40 positions shown; findings below may reference images not displayed]

FINDINGS: Rotator cuff: There is a full-thickness, near full width tear of the
supraspinatus tendon at the footprint (coronal T2 image 12). There
is high-grade articular sided tearing of the anterior fibers of the
infraspinatus tendon at the footprint (sagittal T2 image 4-6). Mild
tendinosis of the cephalad fibers of the subscapularis tendon. The
teres minor is intact.

Muscles: Intramuscular edema within the infraspinatus and deltoid
muscles. No significant muscle atrophy.

Biceps Long Head: Intact intra and extra-articular long head biceps
tendon.

Acromioclavicular Joint: Moderate-severe arthropathy of the
acromioclavicular joint. There is distension of the
subacromial-subdeltoid bursa with thick peripheral enhancement,
short axis measuring up to 0.9 cm. (Coronal T1 post-contrast image
12, sagittal post image 5).

Glenohumeral Joint: Trace glenohumeral joint fluid. Mild chondrosis.

Labrum: Degenerative superior labral tearing extending anteriorly
and posteriorly through the biceps labral anchor.

Bones: No fracture or dislocation. No aggressive osseous lesion. No
convincing marrow signal changes to suggest osteomyelitis. There is
low T1 signal within the scapula, acromion, clavicle, nonspecific.

Other: No fluid collection or hematoma.
IMPRESSION: Full-thickness, near full width tear of the supraspinatus tendon at
the footprint. High-grade articular sided tearing of the anterior
fibers of the infraspinatus tendon at the footprint. Mild
subscapularis tendinosis of the cephalad fibers. No significant
muscle atrophy.

Distension of the subacromial-subdeltoid bursa with a peripheral
enhancement, short axis measuring up to 0.9 cm. While this could be
related to the patient's rotator cuff tear, given adjacent reactive
edema in the deltoid and clinical concern for infection, bursa
aspiration should be considered.

Minimal there is minimal glenohumeral joint fluid and without
adjacent marrow signal abnormality to suggest osteomyelitis. Low T1
signal within the scapula, acromion and clavicle is nonspecific,
possibly related to the patient's anemia.

Degenerative superior labral tearing anteriorly and posteriorly
through the biceps labral anchor.

Moderate-severe AC joint arthropathy.

## 2021-07-14 MED ORDER — PANTOPRAZOLE SODIUM 40 MG PO TBEC
40.0000 mg | DELAYED_RELEASE_TABLET | Freq: Two times a day (BID) | ORAL | Status: DC
  Administered 2021-07-15 – 2021-07-19 (×8): 40 mg via ORAL
  Filled 2021-07-14 (×11): qty 1

## 2021-07-14 MED ORDER — LACTULOSE 10 GM/15ML PO SOLN
30.0000 g | Freq: Two times a day (BID) | ORAL | Status: AC
  Administered 2021-07-15: 30 g via ORAL
  Filled 2021-07-14 (×2): qty 45

## 2021-07-14 MED ORDER — SODIUM CHLORIDE 0.9% IV SOLUTION: Freq: Once | INTRAVENOUS | Status: AC

## 2021-07-14 MED ORDER — INSULIN ASPART 100 UNIT/ML IJ SOLN
4.0000 [IU] | Freq: Three times a day (TID) | INTRAMUSCULAR | Status: DC
  Administered 2021-07-16 – 2021-07-20 (×12): 4 [IU] via SUBCUTANEOUS

## 2021-07-14 MED ORDER — LINAGLIPTIN 5 MG PO TABS
5.0000 mg | ORAL_TABLET | Freq: Every day | ORAL | Status: DC
  Administered 2021-07-15 – 2021-07-20 (×6): 5 mg via ORAL
  Filled 2021-07-14 (×6): qty 1

## 2021-07-14 MED ORDER — FUROSEMIDE 10 MG/ML IJ SOLN
40.0000 mg | Freq: Three times a day (TID) | INTRAMUSCULAR | Status: DC
  Administered 2021-07-14: 40 mg via INTRAVENOUS
  Filled 2021-07-14: qty 4

## 2021-07-14 MED ORDER — GADOBUTROL 1 MMOL/ML IV SOLN
10.0000 mL | Freq: Once | INTRAVENOUS | Status: AC | PRN
  Administered 2021-07-14: 10 mL via INTRAVENOUS

## 2021-07-14 MED ORDER — INSULIN GLARGINE-YFGN 100 UNIT/ML ~~LOC~~ SOLN
25.0000 [IU] | Freq: Every day | SUBCUTANEOUS | Status: DC
  Administered 2021-07-14 – 2021-07-19 (×6): 25 [IU] via SUBCUTANEOUS
  Filled 2021-07-14 (×7): qty 0.25

## 2021-07-14 MED ORDER — MAGNESIUM HYDROXIDE 400 MG/5ML PO SUSP: 30.0000 mL | Freq: Every day | ORAL | Status: DC | PRN

## 2021-07-14 NOTE — TOC Progression Note (Addendum)
Transition of Care [facility]) - Progression Note    Patient Details  Name: Joshua Cole MRN: [id number] Date of Birth: 11/24/1960  [facility] of [facility]) CM/SW Contact  Carles Collet, RN Phone Number: 07/14/2021, 12:26 PM  Clinical Narrative:    Per notes from [facility] and rehab, patient has financial barrier to both CIR and SNF, and is requesting to go home at DC. Spoke w patient and daughter over the phone and verified these plans.  Daughter lives two miles away and will increase her frequency of visits. She will also be the teachable caregiver for home infusions.   He will need IV Abx at DC, [name] w [facility] following, and charity [facility] referral to [facility] made as well and they have accepted.   He is having hip pain that is severely limiting ambulation. Patient would be a good candidate for STAR to increase functional ambulation prior to DC. will request this from [place].   Will need 3/1 for home, has RW and shower seat.   Family can transport.      Expected Discharge Plan: [place] Barriers to Discharge: Continued Medical Work up  Expected Discharge Plan and Services Expected Discharge Plan: [place] In-house Referral: Clinical Social Work Discharge Planning Services: CM Consult Post Acute Care Choice: [place] arrangements for the past 2 months: Single Family Home                           HH Arranged: IV Antibiotics, RN HH Agency: [facility] Date HH Agency Contacted: 07/11/21 Time [place]: 1657 Representative spoke with at [place]: [place] Interventions    Readmission Risk Interventions No flowsheet data found.

## 2021-07-14 NOTE — Progress Notes (Signed)
Physical Therapy Treatment Patient Details Name: Joshua Cole MRN: [id number] DOB: 24-May-1961 Today's Date: 07/14/2021   History of Present Illness Pt is a 60 y.o. male admitted 07/03/21 with c/o weakness, joint pain, confusion, nausea/vomiting, fever; of note, pt recently received flu shot and COVID booster on 10/14. Workup for hyponatremia, acute metabolic encephalopathy of unclear etiology. Brain MRI negative for acute injury. Blood cultures (+) staph aureus. Pelvic MRI 10/24 showed bilateral hip joint effusions; edema consistent with myositis. 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 Course complicated by development of heart block. Transfer to [facility] on 10/27 for cardiothoracic sx consult. Chest CTA negative for PE, concern for multiple ill-defined lesions. PMH includes gout, ETOH use.    PT Comments    Pt is progressing slowly with mobility. Today's session was focused on functional mobility. Ambulation was limited by fatigue and orthostatic hypotension with associated feelings of dizziness (see values below). Transfers and ambulation required Min assist x2 for safety. Continue to recommend IPR, but pt would like to return home. Daughter was present during the session and still eager to help him. Pt would benefit from further acute PT while in the hospital to continue to address functional mobility, ambulation, balance, and activity tolerance.  Orthostatic BPs Supine 118/55  Sitting 99/53  Post-Standing 79/43  Return to Supine 102/43     Recommendations for follow up therapy are one component of a multi-disciplinary discharge planning process, led by the attending physician.  Recommendations may be updated based on patient status, additional functional criteria and insurance authorization.  Follow Up Recommendations  Acute inpatient rehab (3hours/day)     Assistance Recommended at Discharge Frequent or constant Supervision/Assistance  Equipment Recommendations   None recommended by PT (TBD W/C and 3-in-1)    Recommendations for Other Services       Precautions / Restrictions Precautions Precautions: Fall Restrictions Weight Bearing Restrictions: No     Mobility  Bed Mobility Overal bed mobility: Needs Assistance Bed Mobility: Supine to Sit     Supine to sit: Mod assist;HOB elevated Sit to supine: Mod assist   General bed mobility comments: Mod assist for LE management    Transfers Overall transfer level: Needs assistance Equipment used: Rolling walker (2 wheels) Transfers: Sit to/from Stand Sit to Stand: Min assist;+2 safety/equipment           General transfer comment: stood from EOB with RW and min assist for trunk elevation and stability. Verbal cues for hand placement. Monitored for orthostatic hypotension.    Ambulation/Gait Ambulation/Gait assistance: Min assist;+2 safety/equipment Gait Distance (Feet): 2 Feet Assistive device: Rolling walker (2 wheels) Gait Pattern/deviations: Step-to pattern;Decreased stride length;Narrow base of support Gait velocity: decreased   General Gait Details: Ambulated from EOB <> BSC. Further ambulation limited by symptomatic orthostatic hypotension.   Stairs             Wheelchair Mobility    Modified Rankin (Stroke Patients Only)       Balance Overall balance assessment: Needs assistance Sitting-balance support: Feet supported Sitting balance-Leahy Scale: Fair Sitting balance - Comments: Pt able to sit EOB with feet supported; c/o dizziness and "seeing colors"   Standing balance support: Reliant on assistive device for balance;Bilateral upper extremity supported Standing balance-Leahy Scale: Poor Standing balance comment: B UE support with RW and min gaurd during static standing and ambulation to [facility]. Dependent for pericare  Cognition Arousal/Alertness: Awake/alert Behavior During Therapy: WFL for tasks  assessed/performed Overall Cognitive Status: Impaired/Different from baseline Area of Impairment: Attention;Awareness                   Current Attention Level: Sustained;Selective       Awareness: Emergent   General Comments: Cognition not formally assessed but pt able to follow commands, not able to divide attention between multiple people.        Exercises      General Comments General comments (skin integrity, edema, etc.): Pt was very fatigued today and c/o dizziness and "seeing colors", especially when changing positions. Education to overcome symptoms (ex: arm pumps).      Pertinent Vitals/Pain Pain Assessment: 0-10 Pain Score: 7  Pain Location: R shoulder Pain Intervention(s): Limited activity within patient's tolerance    Home Living                          Prior Function            PT Goals (current goals can now be found in the care plan section) Acute Rehab PT Goals Patient Stated Goal: to get stronger PT Goal Formulation: With patient Time For Goal Achievement: 07/25/21 Potential to Achieve Goals: Good Progress towards PT goals: Progressing toward goals    Frequency    Min 3X/week      PT Plan Current plan remains appropriate    Co-evaluation              AM-PAC PT "6 Clicks" Mobility   Outcome Measure  Help needed turning from your back to your side while in a flat bed without using bedrails?: A Little Help needed moving from lying on your back to sitting on the side of a flat bed without using bedrails?: A Lot Help needed moving to and from a bed to a chair (including a wheelchair)?: A Lot Help needed standing up from a chair using your arms (e.g., wheelchair or bedside chair)?: A Lot Help needed to walk in hospital room?: A Lot Help needed climbing 3-5 steps with a railing? : A Lot 6 Click Score: 13    End of Session Equipment Utilized During Treatment: Gait belt Activity Tolerance: Patient limited by  fatigue Patient left: with family/visitor present;in bed;with call bell/phone within reach Nurse Communication: Mobility status;Other (comment) (update on BP) PT Visit Diagnosis: Unsteadiness on feet (R26.81);Other abnormalities of gait and mobility (R26.89);Pain Pain - part of body: Hip;Shoulder     Time: 6606-3016 PT Time Calculation (min) (ACUTE ONLY): 30 min  Charges:  $Therapeutic Activity: 23-37 mins                     Brandon Melnick, SPT   Brandon Melnick 07/14/2021, 1:58 PM

## 2021-07-14 NOTE — Progress Notes (Addendum)
Patient up to bed side commode, with BM patient then states he is feeling dizziness and lightheadedness and "seeing colors"  patient assised back to bed and states he feels better, see flow sheet for vital signs. Call bell within reach. Vendela Troung, Bettina Gavia RN

## 2021-07-14 NOTE — Progress Notes (Signed)
Progress Note  Patient Name: Joshua Cole Date of Encounter: 07/14/2021  [facility] Cardiologist: Kathlyn Sacramento, MD   Subjective   Denies any chest pain, dyspnea.  Had some lightheadedness this morning  Inpatient Medications    Scheduled Meds:  aspirin EC  81 mg Oral Daily   Chlorhexidine Gluconate Cloth  6 each Topical Daily   folic acid  1 mg Oral Daily   furosemide  40 mg Intravenous TID   heparin  5,000 Units Subcutaneous Q8H   insulin aspart  0-15 Units Subcutaneous TID WC   insulin aspart  0-5 Units Subcutaneous QHS   insulin glargine-yfgn  23 Units Subcutaneous QHS   multivitamin  1 tablet Oral Daily   pantoprazole  40 mg Oral Daily   sodium chloride flush  10-40 mL Intracatheter Q12H   thiamine  100 mg Oral Daily   urea  15 g Oral BID   Continuous Infusions:   ceFAZolin (ANCEF) IV 2 g (07/14/21 0526)   PRN Meds: acetaminophen, alum & mag hydroxide-simeth, LORazepam, morphine injection, nitroGLYCERIN, ondansetron (ZOFRAN) IV, oxyCODONE, sodium chloride flush   Vital Signs    Vitals:   07/14/21 0315 07/14/21 0520 07/14/21 0748 07/14/21 0853  BP: (!) 102/52  (!) 130/58 (!) 116/49  Pulse: 90  76   Resp: 18  18   Temp: 98.8 F (37.1 C)  98.3 F (36.8 C)   TempSrc: Oral  Oral   SpO2: 96%  98%   Weight:  107.8 kg    Height:        Intake/Output Summary (Last 24 hours) at 07/14/2021 0935 Last data filed at 07/14/2021 0750 Gross per 24 hour  Intake 1080 ml  Output 2620 ml  Net -1540 ml    Last 3 Weights 07/14/2021 07/13/2021 07/12/2021  Weight (lbs) 237 lb 9.6 oz 235 lb 0.2 oz 239 lb 6.7 oz  Weight (kg) 107.775 kg 106.6 kg 108.6 kg      Telemetry    AV dissociation with junctional rhythm rates 70-80s - Personally Reviewed  ECG    No new EKG- Personally Reviewed  Physical Exam   GEN: No acute distress.   Neck: No JVD Cardiac: RRR, no murmurs, rubs, or gallops.  Respiratory: Clear to auscultation bilaterally. GI: Soft, nontender,  non-distended  MS: No edema; No deformity. Neuro:  Nonfocal  Psych: Normal affect   Labs    High Sensitivity Troponin:  No results for input(s): TROPONINIHS in the last 720 hours.   Chemistry Recent Labs  Lab 07/07/21 1213 07/08/21 0628 07/12/21 0059 07/12/21 1633 07/13/21 0340 07/13/21 1642 07/14/21 0225  NA  --    < > 121*   < > 125* 125* 121*  K  --    < > 4.3  --  4.4  --  5.1  CL  --    < > 92*  --  95*  --  91*  CO2  --    < > 25  --  25  --  24  GLUCOSE  --    < > 136*  --  215*  --  270*  BUN  --    < > 11  --  23*  --  47*  CREATININE  --    < > 0.96  --  0.91  --  1.10  CALCIUM  --    < > 7.1*  --  7.6*  --  7.2*  PROT 6.8  --   --   --   --   --   --  ALBUMIN 1.9*   < > <1.5*  --  <1.5*  --  <1.5*  AST 27  --   --   --   --   --   --   ALT 16  --   --   --   --   --   --   ALKPHOS 64  --   --   --   --   --   --   BILITOT 1.4*  --   --   --   --   --   --   GFRNONAA  --    < > >60  --  >60  --  >60  ANIONGAP  --    < > 4*  --  5  --  6   < > = values in this interval not displayed.     Lipids  Recent Labs  Lab 07/08/21 0628  CHOL 96  TRIG 102  HDL <10*  LDLCALC NOT CALCULATED  CHOLHDL NOT CALCULATED     Hematology Recent Labs  Lab 07/08/21 0628 07/08/21 1747 07/13/21 0340  WBC 10.2 9.0 8.9  RBC 3.24* 3.32* 2.83*  HGB 10.7* 10.7* 9.1*  HCT 30.2* 31.7* 27.6*  MCV 93.2 95.5 97.5  MCH 33.0 32.2 32.2  MCHC 35.4 33.8 33.0  RDW 12.8 12.8 12.5  PLT 84* 80* 132*    Thyroid  Recent Labs  Lab 07/07/21 1213  TSH 0.162*     BNP No results for input(s): BNP, PROBNP in the last 168 hours.   DDimer  No results for input(s): DDIMER in the last 168 hours.    Radiology    DG Shoulder Left  Result Date: 07/12/2021 CLINICAL DATA:  Acute left shoulder pain EXAM: LEFT SHOULDER - 2+ VIEW COMPARISON:  None. FINDINGS: There is no evidence of fracture or dislocation. Acromioclavicular degenerative changes. There is no evidence of severe arthropathy  or other focal bone abnormality. Soft tissues are unremarkable. Atherosclerotic plaque of the aorta. IMPRESSION: Negative. Electronically Signed   By: Iven Finn M.D.   On: 07/12/2021 18:39   [place] EKG SITE RITE  Result Date: 07/12/2021 If Site Rite image not attached, placement could not be confirmed due to current cardiac rhythm.  US Abdomen Limited RUQ (LIVER/GB)  Result Date: 07/13/2021 CLINICAL DATA:  Abnormal LFTs. EXAM: ULTRASOUND ABDOMEN LIMITED RIGHT UPPER QUADRANT COMPARISON:  None. FINDINGS: Gallbladder: No gallstones or wall thickening visualized. No sonographic Murphy sign noted by sonographer. The gallbladder is predominantly contracted. There is adenomyomatosis of the gallbladder fundus. Common bile duct: Diameter: 5 mm Liver: There is diffuse increased liver echogenicity most commonly seen in the setting of fatty infiltration. Superimposed inflammation or fibrosis is not excluded. Clinical correlation is recommended. The liver is enlarged measuring approximately 20 cm in length. Portal vein is patent on color Doppler imaging with normal direction of blood flow towards the liver. Other: None. IMPRESSION: Enlarged fatty liver may represent steatohepatitis. Correlation with clinical exam and LFTs recommended. Electronically Signed   By: Anner Crete M.D.   On: 07/13/2021 00:14    Cardiac Studies   TEE 07/07/2021: 1. Left ventricular ejection fraction, by estimation, is 55 to 60%. The  left ventricle has normal function.   2. Right ventricular systolic function is normal. The right ventricular  size is normal.   3. No left atrial/left atrial appendage thrombus was detected.   4. The mitral valve is normal in structure. Mild mitral valve  regurgitation.   5. There  is a mobile mass attached to the tricuspid valve (clip 132)  consistent with a vegetation given the current clinical context.. The  tricuspid valve is degenerative.   6. The aortic valve is tricuspid. Aortic valve  regurgitation is not  visualized.   Conclusion(s)/Recommendation(s): Findings are concerning for  vegetation/infective endocarditis as detailed above.  __________   2D echo 07/04/2021:  1. Left ventricular ejection fraction, by estimation, is 55 to 60%. The  left ventricle has normal function. Left ventricular endocardial border  not optimally defined to evaluate regional wall motion. There is mild left  ventricular hypertrophy. Left  ventricular diastolic parameters are indeterminate.   2. Right ventricular systolic function is normal. The right ventricular  size is normal. Tricuspid regurgitation signal is inadequate for assessing  PA pressure.   3. Left atrial size was mildly dilated.   4. Right atrial size was mildly dilated.   5. The mitral valve is normal in structure. No evidence of mitral valve  regurgitation. No evidence of mitral stenosis.   6. The aortic valve is normal in structure. Aortic valve regurgitation is  not visualized. Mild aortic valve sclerosis is present, with no evidence  of aortic valve stenosis.   7. Aortic dilatation noted. There is borderline dilatation of the aortic  root and of the ascending aorta, measuring 38 mm.   8. The tricuspid was not well visualized. However, there is likely a  mobile vergetation noted on the short axis images. Recommend a TEE.  Patient Profile     60 y.o. male  with gout, alcohol use, bilateral THA, and poor dental hygiene who was admitted to [facility] on 07/03/2021 with MSSA bacteremia and found to have a tricuspid valve vegetation complicated by complete heart block with good escape rhythm who is being seen 07/08/2021 for the evaluation of the above.  Assessment & Plan    MSSA bacteremia with tricuspid valve vegetation with acute metabolic encephalopathy: -- Continue IV Ancef.  PICC line placed -- Appreciate ID recs: recommend MRI shoulder given shoulder pain -- Transferred to [facility] for cardiothoracic surgery evaluation.   Seen by Dr. 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  -- Evaluated by EP (while at [facility]) with no current indication for venous temp wire at this time, given good escape rhythm -- EP recommended pacemaker at time of valve replacement.  We will touch base with EP since not going for surgery -- Avoiding AV nodal blocking agents    Respiratory failure: Stable, weaned to RA with stable sats. -- CTA chest negative for PE with concern for multiple ill-defined lesions   Hyponatremia: 121 today. Suspected in the setting of ETOH use -- Nephrology following, appreciate recs: on lasix 20 mg BID and urea 15g BID -- fluid restriction   Thrombocytopenia: Improving, 57>>78>>84>>132 -- evaluated by oncology  -- Suspected to be in the setting of alcohol use   Alcohol use: -- Cessation advised    DM: Newly diagnosed  with A1c 7.8 --Evaluated by diabetic coordinator, appreciate recs: on semglee 23 units QHS + SSI --Needs outpatient follow up with PCP   Arthralgias: -- Evaluated by orthopedics at [facility] -- ID recommending hip aspiration of joint effusion, will discuss with IR -- Continue previously ordered PRN oxycodone and morphine   Left arm swelling: Duplex negative for DVT  Disposition: PT/OT recommending SNF but he declines as no insurance.  Will plan home with home health  Consults:  Oncology, ID, Ortho, EP, Nephrology   For questions or updates, please contact [place] Please consult [email or web address] for contact info under   Signed, Donato Heinz, MD  07/14/2021, 9:35 AM

## 2021-07-14 NOTE — Progress Notes (Signed)
Patient was working with PT and became dizzy when sitting on side of bed, orthostatics done as able see flowsheet and patient requesting to go to [facility], patient with BM color dark in appearance. PT assisted patient back to bed call bell within reach and family at bedside. PA on for cardiology made aware and orders received. Justyce Baby, Bettina Gavia RN

## 2021-07-14 NOTE — Consult Note (Signed)
[facility] Medical Consultation  Joshua Cole ZHY:[id number] DOB: 24-Nov-1960 DOA: 07/08/2021 PCP: Merryl Hacker, No   Requesting physician: Dr, Debby Bud Date of consultation: 07/14/2021 Reason for consultation: Medical management  Impression/Recommendations Active Problems:   Acute metabolic encephalopathy   Generalized weakness   Bacteremia   Acute bacterial endocarditis   Heart block   MSSA bacteremia    MSSA tricuspid endocarditis, discussed with cardiologist, who has been managing the endocarditis, recommend continue conservative management, infection disease also on board, continue Ancef for now.  Repeat echocardiogram 6 to 8 weeks. Acute anemia, active abdominal pain, has a pulm history of alcohol abuse 20 beers a week, but no history of cirrhosis.  INR within normal limits.  Start PPI twice daily, consult GI. Complete heart block, initial plan with PPM along with putative TR valve replacement, cardiology in contact with EP for further plan since TR replacement not considered at this time. Hyponatremia, euvolemic, nephrology on board, etiology likely beer potomania, enforce fluid restriction 1500 ML per day. Hypoalbuminemia, UA showed no protein, RUQ ultrasound showed no signs of cirrhosis, and he has no chronic diarrhea.  Will check pro albumin level. Thrombocytopenia, probably related to alcohol abuse, platelet number has been trending up, on chemical DVT prophylaxis. IDDM, not controlled, probably has underlying insulin resistance, start Januvia. Alcohol abuse, last drink was more than 10 days ago, no symptoms or signs of active withdrawal.  Abdominal imaging showed fatty liver but no signs of cirrhosis.  INR within normal limits.  Consulted for cessation. Right hip pain, MRI reviewed. ID follows, IR recommend repeat imaging before positive aspiration.  I will followup again tomorrow. Please contact me if I can be of assistance in the meanwhile. Thank you for this  consultation.  Chief Complaint: Feeling ok  HPI:   60 year old male patient with past medical history of gout, alcohol use presented with generalized weakness, bilateral hip pain left shoulder pain, who was admitted to [facility].  Patient was intermittently confused in the ED, blood work showed MSSA bacteremia.  CT angiogram negative for PE, then TEE was positive for tricuspid valve vegetation.  During hospital stay at [facility], patient developed third-degree AV block on 10/26, and transferred to [facility] for further management.  Initial plan was for CT surgery evaluation for TR replacement, CT surgery, evaluated patient and recommended conservative management only, and no surgery.  Patient has been on Ancef since.  For the bilateral hip pain, MRI showed small to moderate right-sided joint effusion.  Infection disease in talking to IR for putative aspiration to rule out septic arthritis.  Patient has mild bilateral hip pain right>left.  Patient was also found to have significant elevation of blood glucose, A1c 7.8.  Patient was started on insulin sliding scale.  Blood work shows old patient has euvolemic hyponatremia likely from alcohol abuse.  Nephrology was on board and started patient on Lasix.  Patient has no history of cirrhosis, acute ultrasound showed fatty liver but no signs of cirrhosis.  Albumin level decreased to 2.7, INR within normal limits.  UA showed no protein.  Review of Systems:  14 point review system negative except those mentioned in HPI.  Past Medical History:  Diagnosis Date   Arthritis    DJD (degenerative joint disease)    Gout    Past Surgical History:  Procedure Laterality Date   JOINT REPLACEMENT     TEE WITHOUT CARDIOVERSION N/A 07/07/2021   Procedure: TRANSESOPHAGEAL ECHOCARDIOGRAM (TEE);  Surgeon: Kate Sable, MD;  Location: [facility] ORS;  Service: Cardiovascular;  Laterality: N/A;   Social History:  reports that he has never smoked. He has never used smokeless  tobacco. He reports current alcohol use. He reports that he does not use drugs.  No Known Allergies Family History  Problem Relation Age of Onset   Psoriasis Sister     Prior to Admission medications   Medication Sig Start Date End Date Taking? Authorizing Provider  acetaminophen (TYLENOL) 325 MG tablet Take 2 tablets (650 mg total) by mouth every 6 (six) hours as needed for mild pain or fever. 07/08/21   Nolberto Hanlon, MD  ceFAZolin (ANCEF) 2-4 GM/100ML-% IVPB Inject 100 mLs (2 g total) into the vein every 8 (eight) hours. 07/08/21   Nolberto Hanlon, MD  folic acid (FOLVITE) 1 MG tablet Take 1 tablet (1 mg total) by mouth daily. 07/09/21   Nolberto Hanlon, MD  furosemide (LASIX) 20 MG tablet Take 1 tablet (20 mg total) by mouth daily. 07/09/21   Nolberto Hanlon, MD  insulin aspart (NOVOLOG) 100 UNIT/ML injection Inject 0-5 Units into the skin at bedtime. 07/08/21   Nolberto Hanlon, MD  insulin aspart (NOVOLOG) 100 UNIT/ML injection Inject 0-20 Units into the skin 3 (three) times daily with meals. 07/08/21   Nolberto Hanlon, MD  insulin aspart (NOVOLOG) 100 UNIT/ML injection Inject 6 Units into the skin 3 (three) times daily with meals. 07/08/21   Nolberto Hanlon, MD  insulin glargine-yfgn (SEMGLEE) 100 UNIT/ML injection Inject 0.23 mLs (23 Units total) into the skin at bedtime. 07/08/21   Nolberto Hanlon, MD  LORazepam (ATIVAN) 2 MG/ML injection Inject 0.5-1 mLs (1-2 mg total) into the vein every hour as needed (seizures or agitation). 07/08/21   Nolberto Hanlon, MD  morphine 2 MG/ML injection Inject 1 mL (2 mg total) into the vein every 3 (three) hours as needed. 07/08/21   Nolberto Hanlon, MD  Multiple Vitamin (MULTIVITAMIN WITH MINERALS) TABS tablet Take 1 tablet by mouth daily. 07/09/21   Nolberto Hanlon, MD  pantoprazole (PROTONIX) 40 MG tablet Take 1 tablet (40 mg total) by mouth daily. 07/09/21   Nolberto Hanlon, MD  thiamine 100 MG tablet Take 1 tablet (100 mg total) by mouth daily. 07/09/21   Nolberto Hanlon, MD    Physical Exam: Blood pressure (!) 104/41, pulse 76, temperature 98.2 F (36.8 C), temperature source Oral, resp. rate 19, height 5\' 10"  (1.778 m), weight 107.8 kg, SpO2 100 %. Vitals:   07/14/21 1600 07/14/21 1700  BP: (!) 101/46 (!) 104/41  Pulse:    Resp:    Temp:    SpO2: 100%     General: Chronic ill appearance Eyes: PERRL ENT: Mucous membranes moist. Neck: Supple no JVD Cardiovascular: RRR, No murmurs Respiratory: clear breathing, no murmurs Abdomen: Soft nontender nondistended Skin: No rash no ulcers. Musculoskeletal: Tenderness on right hip. Psychiatric: Calm Neurologic: No focal deficit  Labs on Admission:  Basic Metabolic Panel: Recent Labs  Lab 07/11/21 0419 07/11/21 1323 07/12/21 0059 07/12/21 1633 07/13/21 0340 07/13/21 1642 07/14/21 0225 07/14/21 1517  NA 123* 123* 121* 124* 125* 125* 121* 124*  K 4.3 4.5 4.3  --  4.4  --  5.1 5.0  CL 92* 93* 92*  --  95*  --  91* 94*  CO2 25 24 25   --  25  --  24 25  GLUCOSE 135* 251* 136*  --  215*  --  270* 255*  BUN 11 14 11   --  23*  --  47* 64*  CREATININE 0.80 1.02 0.96  --  0.91  --  1.10 1.16  CALCIUM 7.2* 7.1* 7.1*  --  7.6*  --  7.2* 7.4*  PHOS 3.2 3.4 2.7  --  3.2  --  3.3  --    Liver Function Tests: Recent Labs  Lab 07/11/21 0419 07/11/21 1323 07/12/21 0059 07/13/21 0340 07/14/21 0225  ALBUMIN <1.5* <1.5* <1.5* <1.5* <1.5*   No results for input(s): LIPASE, AMYLASE in the last 168 hours. No results for input(s): AMMONIA in the last 168 hours. CBC: Recent Labs  Lab 07/08/21 0628 07/08/21 1747 07/13/21 0340  WBC 10.2 9.0 8.9  NEUTROABS 8.3*  --   --   HGB 10.7* 10.7* 9.1*  HCT 30.2* 31.7* 27.6*  MCV 93.2 95.5 97.5  PLT 84* 80* 132*   Cardiac Enzymes: No results for input(s): CKTOTAL, CKMB, CKMBINDEX, TROPONINI in the last 168 hours. BNP: Invalid input(s): POCBNP CBG: Recent Labs  Lab 07/13/21 1548 07/13/21 2107 07/14/21 0626 07/14/21 1117 07/14/21 1600  GLUCAP 227* 213*  276* 221* 284*    Radiological Exams on Admission: MR Shoulder Left W Wo Contrast  Result Date: 07/14/2021 CLINICAL DATA:  Left shoulder pain EXAM: MRI OF THE LEFT SHOULDER WITHOUT AND WITH CONTRAST TECHNIQUE: Multiplanar, multisequence MR imaging of the left shoulder was performed before and after the administration of intravenous contrast. CONTRAST:  30mL GADAVIST GADOBUTROL 1 MMOL/ML IV SOLN COMPARISON:  Left shoulder radiograph 07/12/2021 FINDINGS: Rotator cuff: There is a full-thickness, near full width tear of the supraspinatus tendon at the footprint (coronal T2 image 12). There is high-grade articular sided tearing of the anterior fibers of the infraspinatus tendon at the footprint (sagittal T2 image 4-6). Mild tendinosis of the cephalad fibers of the subscapularis tendon. The teres minor is intact. Muscles: Intramuscular edema within the infraspinatus and deltoid muscles. No significant muscle atrophy. Biceps Long Head: Intact intra and extra-articular long head biceps tendon. Acromioclavicular Joint: Moderate-severe arthropathy of the acromioclavicular joint. There is distension of the subacromial-subdeltoid bursa with thick peripheral enhancement, short axis measuring up to 0.9 cm. (Coronal T1 post-contrast image 12, sagittal post image 5). Glenohumeral Joint: Trace glenohumeral joint fluid. Mild chondrosis. Labrum: Degenerative superior labral tearing extending anteriorly and posteriorly through the biceps labral anchor. Bones: No fracture or dislocation. No aggressive osseous lesion. No convincing marrow signal changes to suggest osteomyelitis. There is low T1 signal within the scapula, acromion, clavicle, nonspecific. Other: No fluid collection or hematoma. IMPRESSION: Full-thickness, near full width tear of the supraspinatus tendon at the footprint. High-grade articular sided tearing of the anterior fibers of the infraspinatus tendon at the footprint. Mild subscapularis tendinosis of the  cephalad fibers. No significant muscle atrophy. Distension of the subacromial-subdeltoid bursa with a peripheral enhancement, short axis measuring up to 0.9 cm. While this could be related to the patient's rotator cuff tear, given adjacent reactive edema in the deltoid and clinical concern for infection, bursa aspiration should be considered. Minimal there is minimal glenohumeral joint fluid and without adjacent marrow signal abnormality to suggest osteomyelitis. Low T1 signal within the scapula, acromion and clavicle is nonspecific, possibly related to the patient's anemia. Degenerative superior labral tearing anteriorly and posteriorly through the biceps labral anchor. Moderate-severe AC joint arthropathy. Electronically Signed   By: Maurine Simmering M.D.   On: 07/14/2021 16:59   DG Shoulder Left  Result Date: 07/12/2021 CLINICAL DATA:  Acute left shoulder pain EXAM: LEFT SHOULDER - 2+ VIEW COMPARISON:  None. FINDINGS: There is no evidence of fracture or dislocation. Acromioclavicular degenerative changes. There is  no evidence of severe arthropathy or other focal bone abnormality. Soft tissues are unremarkable. Atherosclerotic plaque of the aorta. IMPRESSION: Negative. Electronically Signed   By: Iven Finn M.D.   On: 07/12/2021 18:39   US Abdomen Limited RUQ (LIVER/GB)  Result Date: 07/13/2021 CLINICAL DATA:  Abnormal LFTs. EXAM: ULTRASOUND ABDOMEN LIMITED RIGHT UPPER QUADRANT COMPARISON:  None. FINDINGS: Gallbladder: No gallstones or wall thickening visualized. No sonographic Murphy sign noted by sonographer. The gallbladder is predominantly contracted. There is adenomyomatosis of the gallbladder fundus. Common bile duct: Diameter: 5 mm Liver: There is diffuse increased liver echogenicity most commonly seen in the setting of fatty infiltration. Superimposed inflammation or fibrosis is not excluded. Clinical correlation is recommended. The liver is enlarged measuring approximately 20 cm in length. Portal  vein is patent on color Doppler imaging with normal direction of blood flow towards the liver. Other: None. IMPRESSION: Enlarged fatty liver may represent steatohepatitis. Correlation with clinical exam and LFTs recommended. Electronically Signed   By: Anner Crete M.D.   On: 07/13/2021 00:14    EKG: Independently reviewed.  Third-degree AV block  Time spent: 45 minutes  Lequita Halt [facility] Pager [phone] 07/14/2021, 5:48 PM

## 2021-07-14 NOTE — Progress Notes (Signed)
Inpatient Diabetes Program Recommendations  AACE/ADA: New Consensus Statement on Inpatient Glycemic Control   Target Ranges:  Prepandial:   less than 140 mg/dL      Peak postprandial:   less than 180 mg/dL (1-2 hours)      Critically ill patients:  140 - 180 mg/dL   Results for DECORIAN, SCHUENEMANN (MRN [id number]) as of 07/14/2021 10:38  Ref. Range 07/13/2021 06:40 07/13/2021 12:02 07/13/2021 15:48 07/13/2021 21:07 07/14/2021 06:26  Glucose-Capillary Latest Ref Range: 70 - 99 mg/dL 149 (H) 259 (H) 227 (H) 213 (H) 276 (H)    Review of Glycemic Control  Admit with:  MSSA bacteremia Sepsis  Acute hypoxic respiratory failure New diagnosis diabetes mellitus with hyperglycemia (seen by diabetes coordinator at [facility] on 10/26 and 10/27)     Current Orders: Semglee 23 units QHS, Novolog 0-15 units TID, NOvolog 0-5 units QHS   Inpatient Diabetes Program Recommendations:     Insulin: Post prandial glucose is consistently elevated.  Please consider increasing Semglee to 25 units QHS and ordering Novolog 4 units TID with meals for meal coverage if patient eats at least 50% of meals.   Thanks, Barnie Alderman, RN, MSN, CDE Diabetes Coordinator Inpatient Diabetes Program [phone] (Team Pager from 8am to 5pm)

## 2021-07-14 NOTE — Progress Notes (Addendum)
[facility] Progress Note   Subjective:   Urine output continues to be good but sodium back down to 121 this morning.  The patient told me, "I was up all night drinking water." No complaints  Objective Vitals:   07/14/21 1215 07/14/21 1234 07/14/21 1251 07/14/21 1300  BP: (!) 105/51 (!) 102/43 (!) 103/55 110/64  Pulse:  60    Resp: 18 18    Temp:      TempSrc:      SpO2:  97%    Weight:      Height:       Physical Exam Gen: appears comfortable sitting up in bed Eyes: anicteric, EOMI ENT: MMM Neck: supple, no JVD CV:  RRR, no murmur appreciated Abd: soft, obese Lungs: Bilateral chest rise with no increased work of breathing GU: no foley Extr:  1+ pedal  edema Neuro: nonfocal  Additional Objective Labs: Basic Metabolic Panel: Recent Labs  Lab 07/12/21 0059 07/12/21 1633 07/13/21 0340 07/13/21 1642 07/14/21 0225  NA 121*   < > 125* 125* 121*  K 4.3  --  4.4  --  5.1  CL 92*  --  95*  --  91*  CO2 25  --  25  --  24  GLUCOSE 136*  --  215*  --  270*  BUN 11  --  23*  --  47*  CREATININE 0.96  --  0.91  --  1.10  CALCIUM 7.1*  --  7.6*  --  7.2*  PHOS 2.7  --  3.2  --  3.3   < > = values in this interval not displayed.   Liver Function Tests: Recent Labs  Lab 07/12/21 0059 07/13/21 0340 07/14/21 0225  ALBUMIN <1.5* <1.5* <1.5*   No results for input(s): LIPASE, AMYLASE in the last 168 hours. CBC: Recent Labs  Lab 07/08/21 0628 07/08/21 1747 07/13/21 0340  WBC 10.2 9.0 8.9  NEUTROABS 8.3*  --   --   HGB 10.7* 10.7* 9.1*  HCT 30.2* 31.7* 27.6*  MCV 93.2 95.5 97.5  PLT 84* 80* 132*   Blood Culture    Component Value Date/Time   SDES BLOOD RIGHT WRIST 07/05/2021 0747   SDES BLOOD BLOOD RIGHT HAND 07/05/2021 0747   SPECREQUEST  07/05/2021 0747    BOTTLES DRAWN AEROBIC AND ANAEROBIC Blood Culture adequate volume   SPECREQUEST  07/05/2021 0747    BOTTLES DRAWN AEROBIC ONLY Blood Culture adequate volume   CULT  07/05/2021 0747     NO GROWTH 5 DAYS Performed at [facility], [address] [place], [place] [address]    CULT  07/05/2021 0747    NO GROWTH 5 DAYS Performed at [facility], [address] [place], [place] [address]    REPTSTATUS 07/10/2021 FINAL 07/05/2021 0747   REPTSTATUS 07/10/2021 FINAL 07/05/2021 0747    Cardiac Enzymes: No results for input(s): CKTOTAL, CKMB, CKMBINDEX, TROPONINI in the last 168 hours. CBG: Recent Labs  Lab 07/13/21 1202 07/13/21 1548 07/13/21 2107 07/14/21 0626 07/14/21 1117  GLUCAP 259* 227* 213* 276* 221*   Iron Studies: No results for input(s): IRON, TIBC, TRANSFERRIN, FERRITIN in the last 72 hours. @lablastinr3 @ Studies/Results: DG Shoulder Left  Result Date: 07/12/2021 CLINICAL DATA:  Acute left shoulder pain EXAM: LEFT SHOULDER - 2+ VIEW COMPARISON:  None. FINDINGS: There is no evidence of fracture or dislocation. Acromioclavicular degenerative changes. There is no evidence of severe arthropathy or other focal bone abnormality. Soft tissues are unremarkable. Atherosclerotic  plaque of the aorta. IMPRESSION: Negative. Electronically Signed   By: Iven Finn M.D.   On: 07/12/2021 18:39   US Abdomen Limited RUQ (LIVER/GB)  Result Date: 07/13/2021 CLINICAL DATA:  Abnormal LFTs. EXAM: ULTRASOUND ABDOMEN LIMITED RIGHT UPPER QUADRANT COMPARISON:  None. FINDINGS: Gallbladder: No gallstones or wall thickening visualized. No sonographic Murphy sign noted by sonographer. The gallbladder is predominantly contracted. There is adenomyomatosis of the gallbladder fundus. Common bile duct: Diameter: 5 mm Liver: There is diffuse increased liver echogenicity most commonly seen in the setting of fatty infiltration. Superimposed inflammation or fibrosis is not excluded. Clinical correlation is recommended. The liver is enlarged measuring approximately 20 cm in length. Portal vein is patent on color Doppler imaging with normal direction of blood flow towards the  liver. Other: None. IMPRESSION: Enlarged fatty liver may represent steatohepatitis. Correlation with clinical exam and LFTs recommended. Electronically Signed   By: Anner Crete M.D.   On: 07/13/2021 00:14   Medications:   ceFAZolin (ANCEF) IV 2 g (07/14/21 0526)    aspirin EC  81 mg Oral Daily   Chlorhexidine Gluconate Cloth  6 each Topical Daily   folic acid  1 mg Oral Daily   furosemide  40 mg Intravenous TID   heparin  5,000 Units Subcutaneous Q8H   insulin aspart  0-15 Units Subcutaneous TID WC   insulin aspart  0-5 Units Subcutaneous QHS   insulin aspart  4 Units Subcutaneous TID WC   insulin glargine-yfgn  25 Units Subcutaneous QHS   multivitamin  1 tablet Oral Daily   pantoprazole  40 mg Oral Daily   sodium chloride flush  10-40 mL Intracatheter Q12H   thiamine  100 mg Oral Daily   urea  15 g Oral BID    Assessment/Plan **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  ID following.  Panorex ok.  At [facility] for CTS consult -- no surgery, IV abx should cure.    **Hyponatremia: Thought to be related to alcohol use with low solute intake but has failed to improve with IV hydration.  TSH not low, no signs of adrenal insufficiency, no paraproteinemia.  Seems that volume excess is playing a role but ADH may be overactive.  Repeat urine studies on 10/31 with fairly good urine osmolality 179 and urine sodium of 26.  Not high enough to suggest overt SIADH but could represent some degree of heart failure playing a role. Worsened today but it sounds as though the patient is not adhering to fluid restriction. Encouraged him to do so. Will increase lasix to 40mg  TID and continue urea 15g BID. Repeat labs this afternoon   **Anemia: Hemoglobin lower 9.1.  Continue to monitor   **EtOH overuse: thiamine/folate.     **DM: new dx, A1c was 7.8. Per primary.    **thrombocytopenia: Platelets have improved  Likely related to alcohol use  **Hypoalbuminemia: severely low <1.5. Nutrition  playing a role but fairly profound.  Liver ultrasound is suggestive of some Liver disease.   Will follow, call with concerns.    ADDENDUM: patient had orthostatic hypotension while working with therapy. Maybe not as much excess volume as I thought. Will need to re-evaluate what is going on with the hyponatremia; last urine studies didn't get a great picture for SIADH or dehydration but it is still possible. -F/u 3pm BMP -Hold diuretics and urea -Repeat urine studies in AM; hopefully time for urea and lasix to wash out -Will adjust plan if Na continues to fall -Given bloody  stool will also check hemoglobin at 3pm as well.

## 2021-07-14 NOTE — Progress Notes (Signed)
[facility]    Date of Admission:  07/08/2021   Total days of antibiotics 11           ID: Joshua Cole is a 61 y.o. 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 infected  Medications:   aspirin EC  81 mg Oral Daily   Chlorhexidine Gluconate Cloth  6 each Topical Daily   folic acid  1 mg Oral Daily   heparin  5,000 Units Subcutaneous Q8H   insulin aspart  0-15 Units Subcutaneous TID WC   insulin aspart  0-5 Units Subcutaneous QHS   insulin aspart  4 Units Subcutaneous TID WC   insulin glargine-yfgn  25 Units Subcutaneous QHS   multivitamin  1 tablet Oral Daily   pantoprazole  40 mg Oral Daily   sodium chloride flush  10-40 mL Intracatheter Q12H   thiamine  100 mg Oral Daily    Objective: Vital signs in last 24 hours: Temp:  [98.2 F (36.8 C)-98.9 F (37.2 C)] 98.2 F (36.8 C) (11/02 1210) Pulse Rate:  [60-96] 76 (11/02 1557) Resp:  [17-19] 19 (11/02 1557) BP: (99-132)/(43-64) 132/43 (11/02 1557) SpO2:  [94 %-99 %] 98 % (11/02 1557) Weight:  [107.8 kg] 107.8 kg (11/02 0520)   Lab Results Recent Labs    07/13/21 0340 07/13/21 1642 07/14/21 0225 07/14/21 1517  WBC 8.9  --   --   --   HGB 9.1*  --   --   --   HCT 27.6*  --   --   --   NA 125*   < > 121* 124*  K 4.4  --  5.1 5.0  CL 95*  --  91* 94*  CO2 25  --  24 25  BUN 23*  --  47* 64*  CREATININE 0.91  --  1.10 1.16   < > = values in this interval not displayed.   Liver Panel Recent Labs    07/13/21 0340 07/14/21 0225  ALBUMIN <1.5* <1.5*   Sedimentation Rate No results for input(s): ESRSEDRATE in the last 72 hours. C-Reactive Protein No results for input(s): CRP in the last 72  hours.  Microbiology: 10/24 blood cx 10/22 blood cx - MSSA Studies/Results: MR Shoulder Left W Wo Contrast  Result Date: 07/14/2021 CLINICAL DATA:  Left shoulder pain EXAM: MRI OF THE LEFT SHOULDER WITHOUT AND WITH CONTRAST TECHNIQUE: Multiplanar, multisequence MR imaging of the left shoulder was performed before and after the administration of intravenous contrast. CONTRAST:  30mL GADAVIST GADOBUTROL 1 MMOL/ML IV SOLN COMPARISON:  Left shoulder radiograph 07/12/2021 FINDINGS: Rotator cuff: There is a full-thickness, near full width tear of the supraspinatus tendon at the footprint (coronal T2 image 12). There is high-grade articular sided tearing of the anterior fibers of the infraspinatus tendon at the footprint (sagittal T2 image 4-6). Mild tendinosis of the cephalad fibers of the subscapularis tendon. The teres minor is intact. Muscles: Intramuscular edema within the infraspinatus and deltoid muscles. No significant muscle atrophy. Biceps Long Head: Intact intra and extra-articular long head biceps tendon. Acromioclavicular Joint: Moderate-severe arthropathy of the acromioclavicular joint. There is distension of the subacromial-subdeltoid bursa with thick peripheral enhancement, short axis measuring up to  0.9 cm. (Coronal T1 post-contrast image 12, sagittal post image 5). Glenohumeral Joint: Trace glenohumeral joint fluid. Mild chondrosis. Labrum: Degenerative superior labral tearing extending anteriorly and posteriorly through the biceps labral anchor. Bones: No fracture or dislocation. No aggressive osseous lesion. No convincing marrow signal changes to suggest osteomyelitis. There is low T1 signal within the scapula, acromion, clavicle, nonspecific. Other: No fluid collection or hematoma. IMPRESSION: Full-thickness, near full width tear of the supraspinatus tendon at the footprint. High-grade articular sided tearing of the anterior fibers of the infraspinatus tendon at the footprint. Mild  subscapularis tendinosis of the cephalad fibers. No significant muscle atrophy. Distension of the subacromial-subdeltoid bursa with a peripheral enhancement, short axis measuring up to 0.9 cm. While this could be related to the patient's rotator cuff tear, given adjacent reactive edema in the deltoid and clinical concern for infection, bursa aspiration should be considered. Minimal there is minimal glenohumeral joint fluid and without adjacent marrow signal abnormality to suggest osteomyelitis. Low T1 signal within the scapula, acromion and clavicle is nonspecific, possibly related to the patient's anemia. Degenerative superior labral tearing anteriorly and posteriorly through the biceps labral anchor. Moderate-severe AC joint arthropathy. Electronically Signed   By: Maurine Simmering M.D.   On: 07/14/2021 16:59   DG Shoulder Left  Result Date: 07/12/2021 CLINICAL DATA:  Acute left shoulder pain EXAM: LEFT SHOULDER - 2+ VIEW COMPARISON:  None. FINDINGS: There is no evidence of fracture or dislocation. Acromioclavicular degenerative changes. There is no evidence of severe arthropathy or other focal bone abnormality. Soft tissues are unremarkable. Atherosclerotic plaque of the aorta. IMPRESSION: Negative. Electronically Signed   By: Iven Finn M.D.   On: 07/12/2021 18:39   US Abdomen Limited RUQ (LIVER/GB)  Result Date: 07/13/2021 CLINICAL DATA:  Abnormal LFTs. EXAM: ULTRASOUND ABDOMEN LIMITED RIGHT UPPER QUADRANT COMPARISON:  None. FINDINGS: Gallbladder: No gallstones or wall thickening visualized. No sonographic Murphy sign noted by sonographer. The gallbladder is predominantly contracted. There is adenomyomatosis of the gallbladder fundus. Common bile duct: Diameter: 5 mm Liver: There is diffuse increased liver echogenicity most commonly seen in the setting of fatty infiltration. Superimposed inflammation or fibrosis is not excluded. Clinical correlation is recommended. The liver is enlarged measuring  approximately 20 cm in length. Portal vein is patent on color Doppler imaging with normal direction of blood flow towards the liver. Other: None. IMPRESSION: Enlarged fatty liver may represent steatohepatitis. Correlation with clinical exam and LFTs recommended. Electronically Signed   By: Anner Crete M.D.   On: 07/13/2021 00:14     Assessment/Plan: MSSA bacteremia with TV endocarditis = recommend to continue to treat for 6 wk of IV cefazolin  Left shoulder pain/weakness = MRI found full thickness tear to supraspinatus and infraspinatus tendon tear. Subacrominal-subdeltoid bursa inflammation -- recommend IR aspiration-(order has been placed to evaluate). Wil need ortho follow up at conclusion of 6 wk to see if anything can be done with tendon tear. Unlikely to surgical candidate at this time as he is not optimized from DM standpoint and currently undergoing treatment for MSSA TV endocarditis  Hyperglycemia = may benefit from further titration of diabetes regimen    [facility] Pager: [phone]  07/14/2021, 5:20 PM

## 2021-07-15 DIAGNOSIS — E871 Hypo-osmolality and hyponatremia: Secondary | ICD-10-CM

## 2021-07-15 DIAGNOSIS — R7989 Other specified abnormal findings of blood chemistry: Secondary | ICD-10-CM

## 2021-07-15 DIAGNOSIS — G9341 Metabolic encephalopathy: Principal | ICD-10-CM

## 2021-07-15 DIAGNOSIS — R531 Weakness: Secondary | ICD-10-CM

## 2021-07-15 DIAGNOSIS — I442 Atrioventricular block, complete: Secondary | ICD-10-CM

## 2021-07-15 DIAGNOSIS — D62 Acute posthemorrhagic anemia: Secondary | ICD-10-CM

## 2021-07-15 LAB — RENAL FUNCTION PANEL
Albumin: <1.5 g/dL — ABNORMAL LOW (ref 3.5–5.0)
Anion gap: 4 — ABNORMAL LOW (ref 5–15)
BUN: 39 mg/dL — ABNORMAL HIGH (ref 6–20)
CO2: 27 mmol/L (ref 22–32)
Calcium: 7.6 mg/dL — ABNORMAL LOW (ref 8.9–10.3)
Chloride: 98 mmol/L (ref 98–111)
Creatinine, Ser: 0.85 mg/dL (ref 0.61–1.24)
GFR, Estimated: >60 mL/min (ref >60)
Glucose, Bld: 197 mg/dL — ABNORMAL HIGH (ref 70–99)
Phosphorus: 3.9 mg/dL (ref 2.5–4.6)
Potassium: 4.6 mmol/L (ref 3.5–5.1)
Sodium: 129 mmol/L — ABNORMAL LOW (ref 135–145)

## 2021-07-15 LAB — CBC
HCT: 17.7 % — ABNORMAL LOW (ref 39.0–52.0)
Hemoglobin: 6 g/dL — CL (ref 13.0–17.0)
MCH: 31.9 pg (ref 26.0–34.0)
MCHC: 33.9 g/dL (ref 30.0–36.0)
MCV: 94.1 fL (ref 80.0–100.0)
Platelets: 147 10*3/uL — ABNORMAL LOW (ref 150–400)
RBC: 1.88 MIL/uL — ABNORMAL LOW (ref 4.22–5.81)
RDW: 14 % (ref 11.5–15.5)
WBC: 8.8 10*3/uL (ref 4.0–10.5)
nRBC: 0 % (ref 0.0–0.2)

## 2021-07-15 LAB — GLUCOSE, CAPILLARY
Glucose-Capillary: 185 mg/dL — ABNORMAL HIGH (ref 70–99)
Glucose-Capillary: 139 mg/dL — ABNORMAL HIGH (ref 70–99)
Glucose-Capillary: 252 mg/dL — ABNORMAL HIGH (ref 70–99)
Glucose-Capillary: 139 mg/dL — ABNORMAL HIGH (ref 70–99)

## 2021-07-15 LAB — BASIC METABOLIC PANEL
Anion gap: 5 (ref 5–15)
BUN: 28 mg/dL — ABNORMAL HIGH (ref 6–20)
CO2: 26 mmol/L (ref 22–32)
Calcium: 7.7 mg/dL — ABNORMAL LOW (ref 8.9–10.3)
Chloride: 97 mmol/L — ABNORMAL LOW (ref 98–111)
Creatinine, Ser: 0.83 mg/dL (ref 0.61–1.24)
GFR, Estimated: >60 mL/min (ref >60)
Glucose, Bld: 278 mg/dL — ABNORMAL HIGH (ref 70–99)
Potassium: 4.6 mmol/L (ref 3.5–5.1)
Sodium: 128 mmol/L — ABNORMAL LOW (ref 135–145)

## 2021-07-15 LAB — HEMOGLOBIN AND HEMATOCRIT, BLOOD
HCT: 21.2 % — ABNORMAL LOW (ref 39.0–52.0)
Hemoglobin: 7.3 g/dL — ABNORMAL LOW (ref 13.0–17.0)

## 2021-07-15 LAB — PREPARE RBC (CROSSMATCH)

## 2021-07-15 LAB — OSMOLALITY, URINE: Osmolality, Ur: 502 mOsm/kg (ref 300–900)

## 2021-07-15 LAB — SODIUM, URINE, RANDOM: Sodium, Ur: 24 mmol/L

## 2021-07-15 MED ORDER — BISACODYL 5 MG PO TBEC
20.0000 mg | DELAYED_RELEASE_TABLET | Freq: Once | ORAL | Status: AC
  Administered 2021-07-15: 20 mg via ORAL
  Filled 2021-07-15: qty 4

## 2021-07-15 MED ORDER — METOCLOPRAMIDE HCL 5 MG/ML IJ SOLN
10.0000 mg | Freq: Four times a day (QID) | INTRAMUSCULAR | Status: AC
  Administered 2021-07-15 – 2021-07-16 (×2): 10 mg via INTRAVENOUS
  Filled 2021-07-15 (×2): qty 2

## 2021-07-15 MED ORDER — PEG-KCL-NACL-NASULF-NA ASC-C 100 G PO SOLR
0.5000 | Freq: Once | ORAL | Status: AC
  Administered 2021-07-16: 100 g via ORAL
  Filled 2021-07-15: qty 1

## 2021-07-15 MED ORDER — PEG-KCL-NACL-NASULF-NA ASC-C 100 G PO SOLR
0.5000 | Freq: Once | ORAL | Status: AC
  Administered 2021-07-15: 100 g via ORAL
  Filled 2021-07-15: qty 1

## 2021-07-15 MED ORDER — SODIUM CHLORIDE 0.9% IV SOLUTION: Freq: Once | INTRAVENOUS | Status: DC

## 2021-07-15 MED ORDER — PEG-KCL-NACL-NASULF-NA ASC-C 100 G PO SOLR: 1.0000 | Freq: Once | ORAL | Status: DC

## 2021-07-15 NOTE — H&P (View-Only) (Signed)
[facility] Consult: 8:15 AM 07/15/2021  LOS: 7 days    Referring Provider: Dr  Sallyanne Kuster  Primary Care Physician:  Pcp, No Primary Gastroenterologist:  unassigned    Reason for Consultation:  drifting Hgb, anemia, FOBT +   HPI: Joshua Cole is a 60 y.o. male.  PMH of Deg disc disease.  Gout.  S/p bilateral hip arthroplasty.  Never goes to MD except rare work based physicals.  Only reg med is tylenol.   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  Other issues include complete heart block.  New dx DM with elevated A1c.  AKI, improving.  Anemia with progressive decline Hgb.  Hyponatremia.  Hypoalbuminemia.  Thrombocytopenia.  FOBT positive stool which patient says are brown.  Mental status changes with negative MR brain.  Bilateral lung opacities on CT, r/o mets versus multifocal pneumonia.  Abdominal ultrasound w fatty liver s/O steatohepatitis.  L rotator cuff tear.  None specific myositis of gluteal and adductor muscle.    Hgb 15.1 >> 12.6 >> 10.7 >> 9.1 >> 5.2 >> 6 since admit.  1 PRBC on 11/3.   Platelets as low as 57, now 147.  MCV mid 90s.  INR normal. Na as low as 120 now 129.  T bili 1.4, alk phos 64, AST/ALT 27/16 Albumin less than 1.5, prealbumin 5.9 c/w severe protein deficiency FOBT +.    RN from yesterday says pt had dark stool, he is not aware of this.   Denies history of constipation, diarrhea, black or bloody stools.  No nausea or vomiting.  Good appetite.  No dysphagia.  1 episode of some mild bloating and increase gas with discomfort in the lower belly yesterday, it resolved within a few hours.  Denies excessive or unusual bleeding or bruising.  The only medicine he takes is Tylenol to address back pain.  Lower extremity edema has become an issue during hospitalization but was not a  problem as an outpatient.  Family history negative for colorectal cancer or disease, anemia, bleeding disorders, liver or pancreas problems. 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  Drinks beer only on the weekends when he can consume anywhere from 20-30 beers between Friday night and Sunday evening.   Past Medical History:  Diagnosis Date   Arthritis    DJD (degenerative joint disease)    Gout     Past Surgical History:  Procedure Laterality Date   JOINT REPLACEMENT     TEE WITHOUT CARDIOVERSION N/A 07/07/2021   Procedure: TRANSESOPHAGEAL ECHOCARDIOGRAM (TEE);  Surgeon: Kate Sable, MD;  Location: [facility] ORS;  Service: Cardiovascular;  Laterality: N/A;    Prior to Admission medications   Medication Sig Start Date End Date Taking? Authorizing Provider  acetaminophen (TYLENOL) 325 MG tablet Take 2 tablets (650 mg total) by mouth every 6 (six) hours as needed for mild pain or fever. 07/08/21   Nolberto Hanlon, MD  ceFAZolin (ANCEF) 2-4 GM/100ML-% IVPB Inject 100 mLs (2 g total) into the vein  every 8 (eight) hours. 07/08/21   Nolberto Hanlon, MD  folic acid (FOLVITE) 1 MG tablet Take 1 tablet (1 mg total) by mouth daily. 07/09/21   Nolberto Hanlon, MD  furosemide (LASIX) 20 MG tablet Take 1 tablet (20 mg total) by mouth daily. 07/09/21   Nolberto Hanlon, MD  insulin aspart (NOVOLOG) 100 UNIT/ML injection Inject 0-5 Units into the skin at bedtime. 07/08/21   Nolberto Hanlon, MD  insulin aspart (NOVOLOG) 100 UNIT/ML injection Inject 0-20 Units into the skin 3 (three) times daily with meals. 07/08/21   Nolberto Hanlon, MD  insulin aspart (NOVOLOG) 100 UNIT/ML injection Inject 6 Units into the skin 3 (three) times daily with meals. 07/08/21   Nolberto Hanlon, MD  insulin glargine-yfgn (SEMGLEE) 100 UNIT/ML injection Inject 0.23 mLs (23 Units total) into the skin at bedtime. 07/08/21   Nolberto Hanlon, MD  LORazepam (ATIVAN)  2 MG/ML injection Inject 0.5-1 mLs (1-2 mg total) into the vein every hour as needed (seizures or agitation). 07/08/21   Nolberto Hanlon, MD  morphine 2 MG/ML injection Inject 1 mL (2 mg total) into the vein every 3 (three) hours as needed. 07/08/21   Nolberto Hanlon, MD  Multiple Vitamin (MULTIVITAMIN WITH MINERALS) TABS tablet Take 1 tablet by mouth daily. 07/09/21   Nolberto Hanlon, MD  pantoprazole (PROTONIX) 40 MG tablet Take 1 tablet (40 mg total) by mouth daily. 07/09/21   Nolberto Hanlon, MD  thiamine 100 MG tablet Take 1 tablet (100 mg total) by mouth daily. 07/09/21   Nolberto Hanlon, MD    Scheduled Meds:  sodium chloride   Intravenous Once   aspirin EC  81 mg Oral Daily   Chlorhexidine Gluconate Cloth  6 each Topical Daily   folic acid  1 mg Oral Daily   heparin  5,000 Units Subcutaneous Q8H   insulin aspart  0-15 Units Subcutaneous TID WC   insulin aspart  0-5 Units Subcutaneous QHS   insulin aspart  4 Units Subcutaneous TID WC   insulin glargine-yfgn  25 Units Subcutaneous QHS   lactulose  30 g Oral BID   linagliptin  5 mg Oral Daily   multivitamin  1 tablet Oral Daily   pantoprazole  40 mg Oral BID AC   sodium chloride flush  10-40 mL Intracatheter Q12H   thiamine  100 mg Oral Daily   Infusions:   ceFAZolin (ANCEF) IV 2 g (07/15/21 0636)   PRN Meds: acetaminophen, alum & mag hydroxide-simeth, LORazepam, magnesium hydroxide, morphine injection, nitroGLYCERIN, ondansetron (ZOFRAN) IV, oxyCODONE, sodium chloride flush   Allergies as of 07/08/2021   (No Known Allergies)    Family History  Problem Relation Age of Onset   Psoriasis Sister     Social History   Socioeconomic History   Marital status: Unknown    Spouse name: Not on file   Number of children: Not on file   Years of education: Not on file   Highest education level: Not on file  Occupational History   Not on file  Tobacco Use   Smoking status: Never   Smokeless tobacco: Never  Substance and Sexual Activity    Alcohol use: Yes    Comment: bilateral hip replacement   Drug use: Never   Sexual activity: Not on file  Other Topics Concern   Not on file  Social History Narrative   Not on file   Social Determinants of Health   Financial Resource Strain: Not on file  Food Insecurity: Not on file  Transportation Needs:  Not on file  Physical Activity: Not on file  Stress: Not on file  Social Connections: Not on file  Intimate Partner Violence: Not on file    REVIEW OF SYSTEMS: Constitutional: Some fatigue, not profound. ENT:  No nose bleeds.  Sore ulcers on his mouth have developed in the last couple of days. Pulm: No profound dyspnea. CV:  No palpitations, no LE edema.  GU:  No hematuria, no frequency GI: Per HPI. Heme: Per HPI. Transfusions: Per HPI. Neuro:  No headaches, no peripheral tingling or numbness.  No seizures, no syncope. Derm:  No itching, no rash or sores.  Endocrine:  No sweats or chills.  No polyuria or dysuria Immunization: On a Friday he got a flu and COVID booster.  At Saturday he had fever and chills and then presented to urgent care and later came to the ED. Travel:  None beyond local counties in last few months.    PHYSICAL EXAM: Vital signs in last 24 hours: Vitals:   07/15/21 0600 07/15/21 0800  BP: 102/61 (!) 105/57  Pulse:  90  Resp:  20  Temp:    SpO2:  96%   Wt Readings from Last 3 Encounters:  07/15/21 104.8 kg  07/03/21 113.1 kg    General: Obese, does not look unwell.  Alert.  Comfortable. Head: No facial asymmetry or swelling.  No signs of head trauma. Eyes: Pale conjunctiva.  No scleral icterus.  EOMI. Ears: No obvious hearing deficit. Nose: No congestion or discharge. Mouth: Aphthous ulcer on the right buccal mucosa.  Tongue midline.  Mucosa pink, moist, clear.  Poor dentition. Neck: No JVD, no masses, no thyromegaly Lungs: Clear bilaterally without labored breathing or cough.  Poor inspiratory effort. Heart: RRR.  No MRG. Abdomen: Soft.   Minor tenderness in the right upper quadrant..  No organomegaly, bruits, hernias.  Bowel sounds active..   Rectal: Deferred Musc/Skeltl: No joint redness or swelling. Extremities: No CCE. Neurologic: Oriented x3.  Moves all 4 limbs.  No tremor, no asterixis. Skin: No rash, no sores, no telangiectasia, no large areas of bruising. Nodes: No cervical adenopathy.   Psych:  Cooperative, calm, pleasant.  Intake/Output from previous day: 11/02 0701 - 11/03 0700 In: 1332 [P.O.:717; Blood:315; IV Piggyback:300] Out: 2280 [Urine:2280] Intake/Output this shift: Total I/O In: -  Out: 475 [Urine:475]  LAB RESULTS: Recent Labs    07/13/21 0340 07/14/21 1833 07/15/21 0630  WBC 8.9  --  8.8  HGB 9.1* 5.2* 6.0*  HCT 27.6* 15.7* 17.7*  PLT 132*  --  147*   BMET Lab Results  Component Value Date   NA 129 (L) 07/15/2021   NA 124 (L) 07/14/2021   NA 121 (L) 07/14/2021   K 4.6 07/15/2021   K 5.0 07/14/2021   K 5.1 07/14/2021   CL 98 07/15/2021   CL 94 (L) 07/14/2021   CL 91 (L) 07/14/2021   CO2 27 07/15/2021   CO2 25 07/14/2021   CO2 24 07/14/2021   GLUCOSE 197 (H) 07/15/2021   GLUCOSE 255 (H) 07/14/2021   GLUCOSE 270 (H) 07/14/2021   BUN 39 (H) 07/15/2021   BUN 64 (H) 07/14/2021   BUN 47 (H) 07/14/2021   CREATININE 0.85 07/15/2021   CREATININE 1.16 07/14/2021   CREATININE 1.10 07/14/2021   CALCIUM 7.6 (L) 07/15/2021   CALCIUM 7.4 (L) 07/14/2021   CALCIUM 7.2 (L) 07/14/2021   LFT Recent Labs    07/13/21 0340 07/14/21 0225 07/15/21 0630  ALBUMIN <1.5* <1.5* <1.5*  PT/INR Lab Results  Component Value Date   INR 1.2 07/04/2021   INR 1.1 07/03/2021   Hepatitis Panel No results for input(s): HEPBSAG, HCVAB, HEPAIGM, HEPBIGM in the last 72 hours. C-Diff No components found for: CDIFF Lipase  No results found for: LIPASE  Drugs of Abuse     Component Value Date/Time   LABOPIA NONE DETECTED 07/07/2021 2337   COCAINSCRNUR NONE DETECTED 07/07/2021 2337   LABBENZ  POSITIVE (A) 07/07/2021 2337   AMPHETMU NONE DETECTED 07/07/2021 2337   THCU NONE DETECTED 07/07/2021 2337   LABBARB NONE DETECTED 07/07/2021 2337     RADIOLOGY STUDIES: MR Shoulder Left W Wo Contrast  Result Date: 07/14/2021 CLINICAL DATA:  Left shoulder pain EXAM: MRI OF THE LEFT SHOULDER WITHOUT AND WITH CONTRAST TECHNIQUE: Multiplanar, multisequence MR imaging of the left shoulder was performed before and after the administration of intravenous contrast. CONTRAST:  65m GADAVIST GADOBUTROL 1 MMOL/ML IV SOLN COMPARISON:  Left shoulder radiograph 07/12/2021 FINDINGS: Rotator cuff: There is a full-thickness, near full width tear of the supraspinatus tendon at the footprint (coronal T2 image 12). There is high-grade articular sided tearing of the anterior fibers of the infraspinatus tendon at the footprint (sagittal T2 image 4-6). Mild tendinosis of the cephalad fibers of the subscapularis tendon. The teres minor is intact. Muscles: Intramuscular edema within the infraspinatus and deltoid muscles. No significant muscle atrophy. Biceps Long Head: Intact intra and extra-articular long head biceps tendon. Acromioclavicular Joint: Moderate-severe arthropathy of the acromioclavicular joint. There is distension of the subacromial-subdeltoid bursa with thick peripheral enhancement, short axis measuring up to 0.9 cm. (Coronal T1 post-contrast image 12, sagittal post image 5). Glenohumeral Joint: Trace glenohumeral joint fluid. Mild chondrosis. Labrum: Degenerative superior labral tearing extending anteriorly and posteriorly through the biceps labral anchor. Bones: No fracture or dislocation. No aggressive osseous lesion. No convincing marrow signal changes to suggest osteomyelitis. There is low T1 signal within the scapula, acromion, clavicle, nonspecific. Other: No fluid collection or hematoma. IMPRESSION: Full-thickness, near full width tear of the supraspinatus tendon at the footprint. High-grade articular sided  tearing of the anterior fibers of the infraspinatus tendon at the footprint. Mild subscapularis tendinosis of the cephalad fibers. No significant muscle atrophy. Distension of the subacromial-subdeltoid bursa with a peripheral enhancement, short axis measuring up to 0.9 cm. While this could be related to the patient's rotator cuff tear, given adjacent reactive edema in the deltoid and clinical concern for infection, bursa aspiration should be considered. Minimal there is minimal glenohumeral joint fluid and without adjacent marrow signal abnormality to suggest osteomyelitis. Low T1 signal within the scapula, acromion and clavicle is nonspecific, possibly related to the patient's anemia. Degenerative superior labral tearing anteriorly and posteriorly through the biceps labral anchor. Moderate-severe AC joint arthropathy. Electronically Signed   By: JMaurine SimmeringM.D.   On: 07/14/2021 16:59      IMPRESSION:   Anemia with progressive and then more sudden decline Hgb.  Status posttransfusion.  FOBT positive without gross upper or lower GI bleeding.  MSSA endocarditis of TV.    Complete heart block.  Plan is for pacemaker at time of valve replacement.    Hyponatremia, renal following.  No overt SIADH (though possible), heart failure may be playing a role.  Patient not adhering to fluid restrictions.  On Lasix and urea.  Fatty liver.  Thrombocytopenia, improving.  Lung nodules.    New Dx DM, on insulin.    PLAN:       EGD, colonoscopy, tmrw.  Patient agreeable.  Please see diet and prep orders.  Azucena Freed  07/15/2021, 8:15 AM Phone [phone]

## 2021-07-15 NOTE — Consult Note (Signed)
[facility] Consult: 8:15 AM 07/15/2021  LOS: 7 days    Referring Provider: Dr  Sallyanne Kuster  Primary Care Physician:  Pcp, No Primary Gastroenterologist:  unassigned    Reason for Consultation:  drifting Hgb, anemia, FOBT +   HPI: Joshua Cole is a 60 y.o. male.  PMH of Deg disc disease.  Gout.  S/p bilateral hip arthroplasty.  Never goes to MD except rare work based physicals.  Only reg med is tylenol.   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  Other issues include complete heart block.  New dx DM with elevated A1c.  AKI, improving.  Anemia with progressive decline Hgb.  Hyponatremia.  Hypoalbuminemia.  Thrombocytopenia.  FOBT positive stool which patient says are brown.  Mental status changes with negative MR brain.  Bilateral lung opacities on CT, r/o mets versus multifocal pneumonia.  Abdominal ultrasound w fatty liver s/O steatohepatitis.  L rotator cuff tear.  None specific myositis of gluteal and adductor muscle.    Hgb 15.1 >> 12.6 >> 10.7 >> 9.1 >> 5.2 >> 6 since admit.  1 PRBC on 11/3.   Platelets as low as 57, now 147.  MCV mid 90s.  INR normal. Na as low as 120 now 129.  T bili 1.4, alk phos 64, AST/ALT 27/16 Albumin less than 1.5, prealbumin 5.9 c/w severe protein deficiency FOBT +.    RN from yesterday says pt had dark stool, he is not aware of this.   Denies history of constipation, diarrhea, black or bloody stools.  No nausea or vomiting.  Good appetite.  No dysphagia.  1 episode of some mild bloating and increase gas with discomfort in the lower belly yesterday, it resolved within a few hours.  Denies excessive or unusual bleeding or bruising.  The only medicine he takes is Tylenol to address back pain.  Lower extremity edema has become an issue during hospitalization but was not a  problem as an outpatient.  Family history negative for colorectal cancer or disease, anemia, bleeding disorders, liver or pancreas problems. 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  Drinks beer only on the weekends when he can consume anywhere from 20-30 beers between Friday night and Sunday evening.   Past Medical History:  Diagnosis Date   Arthritis    DJD (degenerative joint disease)    Gout     Past Surgical History:  Procedure Laterality Date   JOINT REPLACEMENT     TEE WITHOUT CARDIOVERSION N/A 07/07/2021   Procedure: TRANSESOPHAGEAL ECHOCARDIOGRAM (TEE);  Surgeon: Kate Sable, MD;  Location: [facility] ORS;  Service: Cardiovascular;  Laterality: N/A;    Prior to Admission medications   Medication Sig Start Date End Date Taking? Authorizing Provider  acetaminophen (TYLENOL) 325 MG tablet Take 2 tablets (650 mg total) by mouth every 6 (six) hours as needed for mild pain or fever. 07/08/21   Nolberto Hanlon, MD  ceFAZolin (ANCEF) 2-4 GM/100ML-% IVPB Inject 100 mLs (2 g total) into the vein  every 8 (eight) hours. 07/08/21   Nolberto Hanlon, MD  folic acid (FOLVITE) 1 MG tablet Take 1 tablet (1 mg total) by mouth daily. 07/09/21   Nolberto Hanlon, MD  furosemide (LASIX) 20 MG tablet Take 1 tablet (20 mg total) by mouth daily. 07/09/21   Nolberto Hanlon, MD  insulin aspart (NOVOLOG) 100 UNIT/ML injection Inject 0-5 Units into the skin at bedtime. 07/08/21   Nolberto Hanlon, MD  insulin aspart (NOVOLOG) 100 UNIT/ML injection Inject 0-20 Units into the skin 3 (three) times daily with meals. 07/08/21   Nolberto Hanlon, MD  insulin aspart (NOVOLOG) 100 UNIT/ML injection Inject 6 Units into the skin 3 (three) times daily with meals. 07/08/21   Nolberto Hanlon, MD  insulin glargine-yfgn (SEMGLEE) 100 UNIT/ML injection Inject 0.23 mLs (23 Units total) into the skin at bedtime. 07/08/21   Nolberto Hanlon, MD  LORazepam (ATIVAN)  2 MG/ML injection Inject 0.5-1 mLs (1-2 mg total) into the vein every hour as needed (seizures or agitation). 07/08/21   Nolberto Hanlon, MD  morphine 2 MG/ML injection Inject 1 mL (2 mg total) into the vein every 3 (three) hours as needed. 07/08/21   Nolberto Hanlon, MD  Multiple Vitamin (MULTIVITAMIN WITH MINERALS) TABS tablet Take 1 tablet by mouth daily. 07/09/21   Nolberto Hanlon, MD  pantoprazole (PROTONIX) 40 MG tablet Take 1 tablet (40 mg total) by mouth daily. 07/09/21   Nolberto Hanlon, MD  thiamine 100 MG tablet Take 1 tablet (100 mg total) by mouth daily. 07/09/21   Nolberto Hanlon, MD    Scheduled Meds:  sodium chloride   Intravenous Once   aspirin EC  81 mg Oral Daily   Chlorhexidine Gluconate Cloth  6 each Topical Daily   folic acid  1 mg Oral Daily   heparin  5,000 Units Subcutaneous Q8H   insulin aspart  0-15 Units Subcutaneous TID WC   insulin aspart  0-5 Units Subcutaneous QHS   insulin aspart  4 Units Subcutaneous TID WC   insulin glargine-yfgn  25 Units Subcutaneous QHS   lactulose  30 g Oral BID   linagliptin  5 mg Oral Daily   multivitamin  1 tablet Oral Daily   pantoprazole  40 mg Oral BID AC   sodium chloride flush  10-40 mL Intracatheter Q12H   thiamine  100 mg Oral Daily   Infusions:   ceFAZolin (ANCEF) IV 2 g (07/15/21 0636)   PRN Meds: acetaminophen, alum & mag hydroxide-simeth, LORazepam, magnesium hydroxide, morphine injection, nitroGLYCERIN, ondansetron (ZOFRAN) IV, oxyCODONE, sodium chloride flush   Allergies as of 07/08/2021   (No Known Allergies)    Family History  Problem Relation Age of Onset   Psoriasis Sister     Social History   Socioeconomic History   Marital status: Unknown    Spouse name: Not on file   Number of children: Not on file   Years of education: Not on file   Highest education level: Not on file  Occupational History   Not on file  Tobacco Use   Smoking status: Never   Smokeless tobacco: Never  Substance and Sexual Activity    Alcohol use: Yes    Comment: bilateral hip replacement   Drug use: Never   Sexual activity: Not on file  Other Topics Concern   Not on file  Social History Narrative   Not on file   Social Determinants of Health   Financial Resource Strain: Not on file  Food Insecurity: Not on file  Transportation Needs:  Not on file  Physical Activity: Not on file  Stress: Not on file  Social Connections: Not on file  Intimate Partner Violence: Not on file    REVIEW OF SYSTEMS: Constitutional: Some fatigue, not profound. ENT:  No nose bleeds.  Sore ulcers on his mouth have developed in the last couple of days. Pulm: No profound dyspnea. CV:  No palpitations, no LE edema.  GU:  No hematuria, no frequency GI: Per HPI. Heme: Per HPI. Transfusions: Per HPI. Neuro:  No headaches, no peripheral tingling or numbness.  No seizures, no syncope. Derm:  No itching, no rash or sores.  Endocrine:  No sweats or chills.  No polyuria or dysuria Immunization: On a Friday he got a flu and COVID booster.  At Saturday he had fever and chills and then presented to urgent care and later came to the ED. Travel:  None beyond local counties in last few months.    PHYSICAL EXAM: Vital signs in last 24 hours: Vitals:   07/15/21 0600 07/15/21 0800  BP: 102/61 (!) 105/57  Pulse:  90  Resp:  20  Temp:    SpO2:  96%   Wt Readings from Last 3 Encounters:  07/15/21 104.8 kg  07/03/21 113.1 kg    General: Obese, does not look unwell.  Alert.  Comfortable. Head: No facial asymmetry or swelling.  No signs of head trauma. Eyes: Pale conjunctiva.  No scleral icterus.  EOMI. Ears: No obvious hearing deficit. Nose: No congestion or discharge. Mouth: Aphthous ulcer on the right buccal mucosa.  Tongue midline.  Mucosa pink, moist, clear.  Poor dentition. Neck: No JVD, no masses, no thyromegaly Lungs: Clear bilaterally without labored breathing or cough.  Poor inspiratory effort. Heart: RRR.  No MRG. Abdomen: Soft.   Minor tenderness in the right upper quadrant..  No organomegaly, bruits, hernias.  Bowel sounds active..   Rectal: Deferred Musc/Skeltl: No joint redness or swelling. Extremities: No CCE. Neurologic: Oriented x3.  Moves all 4 limbs.  No tremor, no asterixis. Skin: No rash, no sores, no telangiectasia, no large areas of bruising. Nodes: No cervical adenopathy.   Psych:  Cooperative, calm, pleasant.  Intake/Output from previous day: 11/02 0701 - 11/03 0700 In: 1332 [P.O.:717; Blood:315; IV Piggyback:300] Out: 2280 [Urine:2280] Intake/Output this shift: Total I/O In: -  Out: 475 [Urine:475]  LAB RESULTS: Recent Labs    07/13/21 0340 07/14/21 1833 07/15/21 0630  WBC 8.9  --  8.8  HGB 9.1* 5.2* 6.0*  HCT 27.6* 15.7* 17.7*  PLT 132*  --  147*   BMET Lab Results  Component Value Date   NA 129 (L) 07/15/2021   NA 124 (L) 07/14/2021   NA 121 (L) 07/14/2021   K 4.6 07/15/2021   K 5.0 07/14/2021   K 5.1 07/14/2021   CL 98 07/15/2021   CL 94 (L) 07/14/2021   CL 91 (L) 07/14/2021   CO2 27 07/15/2021   CO2 25 07/14/2021   CO2 24 07/14/2021   GLUCOSE 197 (H) 07/15/2021   GLUCOSE 255 (H) 07/14/2021   GLUCOSE 270 (H) 07/14/2021   BUN 39 (H) 07/15/2021   BUN 64 (H) 07/14/2021   BUN 47 (H) 07/14/2021   CREATININE 0.85 07/15/2021   CREATININE 1.16 07/14/2021   CREATININE 1.10 07/14/2021   CALCIUM 7.6 (L) 07/15/2021   CALCIUM 7.4 (L) 07/14/2021   CALCIUM 7.2 (L) 07/14/2021   LFT Recent Labs    07/13/21 0340 07/14/21 0225 07/15/21 0630  ALBUMIN <1.5* <1.5* <1.5*  PT/INR Lab Results  Component Value Date   INR 1.2 07/04/2021   INR 1.1 07/03/2021   Hepatitis Panel No results for input(s): HEPBSAG, HCVAB, HEPAIGM, HEPBIGM in the last 72 hours. C-Diff No components found for: CDIFF Lipase  No results found for: LIPASE  Drugs of Abuse     Component Value Date/Time   LABOPIA NONE DETECTED 07/07/2021 2337   COCAINSCRNUR NONE DETECTED 07/07/2021 2337   LABBENZ  POSITIVE (A) 07/07/2021 2337   AMPHETMU NONE DETECTED 07/07/2021 2337   THCU NONE DETECTED 07/07/2021 2337   LABBARB NONE DETECTED 07/07/2021 2337     RADIOLOGY STUDIES: MR Shoulder Left W Wo Contrast  Result Date: 07/14/2021 CLINICAL DATA:  Left shoulder pain EXAM: MRI OF THE LEFT SHOULDER WITHOUT AND WITH CONTRAST TECHNIQUE: Multiplanar, multisequence MR imaging of the left shoulder was performed before and after the administration of intravenous contrast. CONTRAST:  65m GADAVIST GADOBUTROL 1 MMOL/ML IV SOLN COMPARISON:  Left shoulder radiograph 07/12/2021 FINDINGS: Rotator cuff: There is a full-thickness, near full width tear of the supraspinatus tendon at the footprint (coronal T2 image 12). There is high-grade articular sided tearing of the anterior fibers of the infraspinatus tendon at the footprint (sagittal T2 image 4-6). Mild tendinosis of the cephalad fibers of the subscapularis tendon. The teres minor is intact. Muscles: Intramuscular edema within the infraspinatus and deltoid muscles. No significant muscle atrophy. Biceps Long Head: Intact intra and extra-articular long head biceps tendon. Acromioclavicular Joint: Moderate-severe arthropathy of the acromioclavicular joint. There is distension of the subacromial-subdeltoid bursa with thick peripheral enhancement, short axis measuring up to 0.9 cm. (Coronal T1 post-contrast image 12, sagittal post image 5). Glenohumeral Joint: Trace glenohumeral joint fluid. Mild chondrosis. Labrum: Degenerative superior labral tearing extending anteriorly and posteriorly through the biceps labral anchor. Bones: No fracture or dislocation. No aggressive osseous lesion. No convincing marrow signal changes to suggest osteomyelitis. There is low T1 signal within the scapula, acromion, clavicle, nonspecific. Other: No fluid collection or hematoma. IMPRESSION: Full-thickness, near full width tear of the supraspinatus tendon at the footprint. High-grade articular sided  tearing of the anterior fibers of the infraspinatus tendon at the footprint. Mild subscapularis tendinosis of the cephalad fibers. No significant muscle atrophy. Distension of the subacromial-subdeltoid bursa with a peripheral enhancement, short axis measuring up to 0.9 cm. While this could be related to the patient's rotator cuff tear, given adjacent reactive edema in the deltoid and clinical concern for infection, bursa aspiration should be considered. Minimal there is minimal glenohumeral joint fluid and without adjacent marrow signal abnormality to suggest osteomyelitis. Low T1 signal within the scapula, acromion and clavicle is nonspecific, possibly related to the patient's anemia. Degenerative superior labral tearing anteriorly and posteriorly through the biceps labral anchor. Moderate-severe AC joint arthropathy. Electronically Signed   By: JMaurine SimmeringM.D.   On: 07/14/2021 16:59      IMPRESSION:   Anemia with progressive and then more sudden decline Hgb.  Status posttransfusion.  FOBT positive without gross upper or lower GI bleeding.  MSSA endocarditis of TV.    Complete heart block.  Plan is for pacemaker at time of valve replacement.    Hyponatremia, renal following.  No overt SIADH (though possible), heart failure may be playing a role.  Patient not adhering to fluid restrictions.  On Lasix and urea.  Fatty liver.  Thrombocytopenia, improving.  Lung nodules.    New Dx DM, on insulin.    PLAN:       EGD, colonoscopy, tmrw.  Patient agreeable.  Please see diet and prep orders.  Azucena Freed  07/15/2021, 8:15 AM Phone [phone]

## 2021-07-15 NOTE — Progress Notes (Signed)
[facility]    Date of Admission:  07/08/2021             ID: Joshua Cole is a 60 y.o. male with  MSSA TV endocarditis Active Problems:   Acute metabolic encephalopathy   Generalized weakness   Bacteremia   Acute bacterial endocarditis   Heart block   MSSA bacteremia   Acute blood loss anemia   Heart block AV complete (HCC)    Subjective: Febrile but having to need blood transfusion. Still limited motion of left shoulder due to tendon tear  Medications:   sodium chloride   Intravenous Once   Chlorhexidine Gluconate Cloth  6 each Topical Daily   folic acid  1 mg Oral Daily   insulin aspart  0-15 Units Subcutaneous TID WC   insulin aspart  0-5 Units Subcutaneous QHS   insulin aspart  4 Units Subcutaneous TID WC   insulin glargine-yfgn  25 Units Subcutaneous QHS   lactulose  30 g Oral BID   linagliptin  5 mg Oral Daily   metoCLOPramide (REGLAN) injection  10 mg Intravenous Q6H   multivitamin  1 tablet Oral Daily   pantoprazole  40 mg Oral BID AC   peg 3350 powder  0.5 kit Oral Once   And   peg 3350 powder  0.5 kit Oral Once   sodium chloride flush  10-40 mL Intracatheter Q12H   thiamine  100 mg Oral Daily    Objective: Vital signs in last 24 hours: Temp:  [97.6 F (36.4 C)-98.3 F (36.8 C)] 97.6 F (36.4 C) (11/03 1100) Pulse Rate:  [75-100] 100 (11/03 1100) Resp:  [15-20] 16 (11/03 1300) BP: (95-121)/(40-79) 105/54 (11/03 1300) SpO2:  [93 %-100 %] 100 % (11/03 1100) Weight:  [104.8 kg] 104.8 kg (11/03 0535) Physical Exam  Constitutional: He is oriented to person, place, and time. He appears well-developed and well-nourished. No distress.  HENT:  Mouth/Throat: Oropharynx is clear and moist. No oropharyngeal exudate.  Cardiovascular: Normal rate, regular rhythm and normal heart sounds. Exam reveals no gallop and no friction rub.  No murmur heard.  Pulmonary/Chest: Effort normal and breath sounds normal. No respiratory  distress. He has no wheezes.  Abdominal: Soft. Bowel sounds are normal. He exhibits no distension. There is no tenderness.  IWO:EHOZY arm picc line is c/d/i Neurological: He is alert and oriented to person, place, and time.  Skin: Skin is warm and dry. No rash noted. No erythema.  Psychiatric: He has a normal mood and affect. His behavior is normal.    Lab Results Recent Labs    07/13/21 0340 07/13/21 1642 07/14/21 1517 07/14/21 1833 07/15/21 0630  WBC 8.9  --   --   --  8.8  HGB 9.1*  --   --  5.2* 6.0*  HCT 27.6*  --   --  15.7* 17.7*  NA 125*   < > 124*  --  129*  K 4.4   < > 5.0  --  4.6  CL 95*   < > 94*  --  98  CO2 25   < > 25  --  27  BUN 23*   < > 64*  --  39*  CREATININE 0.91   < > 1.16  --  0.85   < > = values in this interval not displayed.   Liver Panel Recent Labs    07/14/21 0225 07/15/21 0630  ALBUMIN <1.5* <1.5*   Sedimentation Rate No results  for input(s): ESRSEDRATE in the last 72 hours. C-Reactive Protein No results for input(s): CRP in the last 72 hours.  Microbiology: 10/24 blood cx ngtd 10/22 blood cx MSSA Studies/Results: MR Shoulder Left W Wo Contrast  Result Date: 07/14/2021 CLINICAL DATA:  Left shoulder pain EXAM: MRI OF THE LEFT SHOULDER WITHOUT AND WITH CONTRAST TECHNIQUE: Multiplanar, multisequence MR imaging of the left shoulder was performed before and after the administration of intravenous contrast. CONTRAST:  33m GADAVIST GADOBUTROL 1 MMOL/ML IV SOLN COMPARISON:  Left shoulder radiograph 07/12/2021 FINDINGS: Rotator cuff: There is a full-thickness, near full width tear of the supraspinatus tendon at the footprint (coronal T2 image 12). There is high-grade articular sided tearing of the anterior fibers of the infraspinatus tendon at the footprint (sagittal T2 image 4-6). Mild tendinosis of the cephalad fibers of the subscapularis tendon. The teres minor is intact. Muscles: Intramuscular edema within the infraspinatus and deltoid muscles.  No significant muscle atrophy. Biceps Long Head: Intact intra and extra-articular long head biceps tendon. Acromioclavicular Joint: Moderate-severe arthropathy of the acromioclavicular joint. There is distension of the subacromial-subdeltoid bursa with thick peripheral enhancement, short axis measuring up to 0.9 cm. (Coronal T1 post-contrast image 12, sagittal post image 5). Glenohumeral Joint: Trace glenohumeral joint fluid. Mild chondrosis. Labrum: Degenerative superior labral tearing extending anteriorly and posteriorly through the biceps labral anchor. Bones: No fracture or dislocation. No aggressive osseous lesion. No convincing marrow signal changes to suggest osteomyelitis. There is low T1 signal within the scapula, acromion, clavicle, nonspecific. Other: No fluid collection or hematoma. IMPRESSION: Full-thickness, near full width tear of the supraspinatus tendon at the footprint. High-grade articular sided tearing of the anterior fibers of the infraspinatus tendon at the footprint. Mild subscapularis tendinosis of the cephalad fibers. No significant muscle atrophy. Distension of the subacromial-subdeltoid bursa with a peripheral enhancement, short axis measuring up to 0.9 cm. While this could be related to the patient's rotator cuff tear, given adjacent reactive edema in the deltoid and clinical concern for infection, bursa aspiration should be considered. Minimal there is minimal glenohumeral joint fluid and without adjacent marrow signal abnormality to suggest osteomyelitis. Low T1 signal within the scapula, acromion and clavicle is nonspecific, possibly related to the patient's anemia. Degenerative superior labral tearing anteriorly and posteriorly through the biceps labral anchor. Moderate-severe AC joint arthropathy. Electronically Signed   By: JMaurine SimmeringM.D.   On: 07/14/2021 16:59     Assessment/Plan: MSSA bacteremia with native TV endocarditis = recommend to treat for 6 wk with IV cefazolin.  Using 10/24 as day 1. End date dec 5th. Opat orders to be placed.   Left shoulder injury found to have tendon tear = eval by radiology thinks changes due to trauma less so for infection.  Anemia, presumably GI source/hx of ETOH use = getting EGD for evaluation  Health maintenance = recommend to give flu, and covid bivalent prior to discharge  We will have him be seen back in ID clinic in 4-5 wk  Will sign off    [facility] Pager: [phone]  07/15/2021, 4:00 PM

## 2021-07-15 NOTE — Progress Notes (Signed)
PHARMACY CONSULT NOTE FOR:  OUTPATIENT  PARENTERAL ANTIBIOTIC THERAPY (OPAT)  Indication: MSSA endocarditis Regimen: Cefazolin 2 gm IV Q 8 hours End date: 08/16/21  IV antibiotic discharge orders are pended. To discharging provider:  please sign these orders via discharge navigator,  Select New Orders & click on the button choice - Manage This Unsigned Work.     Thank you for allowing pharmacy to be a part of this patient's care.  Jimmy Footman, PharmD, BCPS, BCIDP Infectious Diseases Clinical Pharmacist Phone: [phone] 07/15/2021, 4:05 PM

## 2021-07-15 NOTE — Progress Notes (Signed)
[facility] Progress Note   Subjective:   The patient had an episode of orthostatic hypotension today when working with physical therapy.  Also bloody stool.  Hemoglobin found to be fairly low at 5.2.  Undergoing transfusions.  Sodium improved to 129 today.  Objective Vitals:   07/15/21 0535 07/15/21 0600 07/15/21 0800 07/15/21 1000  BP:  102/61 (!) 105/57 (!) 97/51  Pulse:   90 99  Resp:   20   Temp:   98 F (36.7 C) 97.9 F (36.6 C)  TempSrc:   Oral Oral  SpO2:   96% 100%  Weight: 104.8 kg     Height:       Physical Exam Gen: Tired, lying in bed, no distress Eyes: anicteric, EOMI ENT: MMM Neck: supple, no JVD CV: Normal rate, no rub Abd: soft, obese Lungs: Bilateral chest rise with no increased work of breathing GU: no foley Extr:  1+ pedal  edema Neuro: nonfocal  Additional Objective Labs: Basic Metabolic Panel: Recent Labs  Lab 07/13/21 0340 07/13/21 1642 07/14/21 0225 07/14/21 1517 07/15/21 0630  NA 125*   < > 121* 124* 129*  K 4.4  --  5.1 5.0 4.6  CL 95*  --  91* 94* 98  CO2 25  --  '24 25 27  ' GLUCOSE 215*  --  270* 255* 197*  BUN 23*  --  47* 64* 39*  CREATININE 0.91  --  1.10 1.16 0.85  CALCIUM 7.6*  --  7.2* 7.4* 7.6*  PHOS 3.2  --  3.3  --  3.9   < > = values in this interval not displayed.   Liver Function Tests: Recent Labs  Lab 07/13/21 0340 07/14/21 0225 07/15/21 0630  ALBUMIN <1.5* <1.5* <1.5*   No results for input(s): LIPASE, AMYLASE in the last 168 hours. CBC: Recent Labs  Lab 07/08/21 1747 07/13/21 0340 07/14/21 1833 07/15/21 0630  WBC 9.0 8.9  --  8.8  HGB 10.7* 9.1* 5.2* 6.0*  HCT 31.7* 27.6* 15.7* 17.7*  MCV 95.5 97.5  --  94.1  PLT 80* 132*  --  147*   Blood Culture    Component Value Date/Time   SDES BLOOD RIGHT WRIST 07/05/2021 0747   SDES BLOOD BLOOD RIGHT HAND 07/05/2021 0747   SPECREQUEST  07/05/2021 0747    BOTTLES DRAWN AEROBIC AND ANAEROBIC Blood Culture adequate volume   SPECREQUEST   07/05/2021 0747    BOTTLES DRAWN AEROBIC ONLY Blood Culture adequate volume   CULT  07/05/2021 0747    NO GROWTH 5 DAYS Performed at [facility], [address] [place], [place] [address]    CULT  07/05/2021 0747    NO GROWTH 5 DAYS Performed at [facility], [address] [place], [place] [address]    REPTSTATUS 07/10/2021 FINAL 07/05/2021 0747   REPTSTATUS 07/10/2021 FINAL 07/05/2021 0747    Cardiac Enzymes: No results for input(s): CKTOTAL, CKMB, CKMBINDEX, TROPONINI in the last 168 hours. CBG: Recent Labs  Lab 07/14/21 0626 07/14/21 1117 07/14/21 1600 07/14/21 2129 07/15/21 0622  GLUCAP 276* 221* 284* 248* 185*   Iron Studies: No results for input(s): IRON, TIBC, TRANSFERRIN, FERRITIN in the last 72 hours. '@[email or web address]' @ Studies/Results: MR Shoulder Left W Wo Contrast  Result Date: 07/14/2021 CLINICAL DATA:  Left shoulder pain EXAM: MRI OF THE LEFT SHOULDER WITHOUT AND WITH CONTRAST TECHNIQUE: Multiplanar, multisequence MR imaging of the left shoulder was performed before and after the administration of intravenous contrast. CONTRAST:  34m GADAVIST  GADOBUTROL 1 MMOL/ML IV SOLN COMPARISON:  Left shoulder radiograph 07/12/2021 FINDINGS: Rotator cuff: There is a full-thickness, near full width tear of the supraspinatus tendon at the footprint (coronal T2 image 12). There is high-grade articular sided tearing of the anterior fibers of the infraspinatus tendon at the footprint (sagittal T2 image 4-6). Mild tendinosis of the cephalad fibers of the subscapularis tendon. The teres minor is intact. Muscles: Intramuscular edema within the infraspinatus and deltoid muscles. No significant muscle atrophy. Biceps Long Head: Intact intra and extra-articular long head biceps tendon. Acromioclavicular Joint: Moderate-severe arthropathy of the acromioclavicular joint. There is distension of the subacromial-subdeltoid bursa with thick peripheral enhancement, short axis  measuring up to 0.9 cm. (Coronal T1 post-contrast image 12, sagittal post image 5). Glenohumeral Joint: Trace glenohumeral joint fluid. Mild chondrosis. Labrum: Degenerative superior labral tearing extending anteriorly and posteriorly through the biceps labral anchor. Bones: No fracture or dislocation. No aggressive osseous lesion. No convincing marrow signal changes to suggest osteomyelitis. There is low T1 signal within the scapula, acromion, clavicle, nonspecific. Other: No fluid collection or hematoma. IMPRESSION: Full-thickness, near full width tear of the supraspinatus tendon at the footprint. High-grade articular sided tearing of the anterior fibers of the infraspinatus tendon at the footprint. Mild subscapularis tendinosis of the cephalad fibers. No significant muscle atrophy. Distension of the subacromial-subdeltoid bursa with a peripheral enhancement, short axis measuring up to 0.9 cm. While this could be related to the patient's rotator cuff tear, given adjacent reactive edema in the deltoid and clinical concern for infection, bursa aspiration should be considered. Minimal there is minimal glenohumeral joint fluid and without adjacent marrow signal abnormality to suggest osteomyelitis. Low T1 signal within the scapula, acromion and clavicle is nonspecific, possibly related to the patient's anemia. Degenerative superior labral tearing anteriorly and posteriorly through the biceps labral anchor. Moderate-severe AC joint arthropathy. Electronically Signed   By: Maurine Simmering M.D.   On: 07/14/2021 16:59   Medications:   ceFAZolin (ANCEF) IV 2 g (07/15/21 0636)    sodium chloride   Intravenous Once   bisacodyl  20 mg Oral Once   Chlorhexidine Gluconate Cloth  6 each Topical Daily   folic acid  1 mg Oral Daily   heparin  5,000 Units Subcutaneous Q8H   insulin aspart  0-15 Units Subcutaneous TID WC   insulin aspart  0-5 Units Subcutaneous QHS   insulin aspart  4 Units Subcutaneous TID WC   insulin  glargine-yfgn  25 Units Subcutaneous QHS   lactulose  30 g Oral BID   linagliptin  5 mg Oral Daily   metoCLOPramide (REGLAN) injection  10 mg Intravenous Q6H   multivitamin  1 tablet Oral Daily   pantoprazole  40 mg Oral BID AC   peg 3350 powder  0.5 kit Oral Once   And   peg 3350 powder  0.5 kit Oral Once   sodium chloride flush  10-40 mL Intracatheter Q12H   thiamine  100 mg Oral Daily    Assessment/Plan **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  ID following.  Panorex ok.  At [facility] for CTS consult -- no surgery, on antibiotics   **Hyponatremia: Thought to be related to alcohol use with low solute intake but has failed to improve with IV hydration.  TSH not low, no signs of adrenal insufficiency, no paraproteinemia. Repeat urine studies on 10/31 with fairly good urine osmolality 179 and urine sodium of 26.  Not high enough to suggest overt SIADH but could represent some  degree of heart failure playing a role. Given hypotension yesterday we paused urea and despite this lasix and Na improved significantly today. Will continue to hold therapy and consider restarting diuretics based on his progress.   **Anemia/GI bleeding: Globin dropped significantly yesterday with hemoglobin of 5.  Transfusion per primary team.  GI being involved.   **EtOH overuse: thiamine/folate.     **DM: new dx, A1c was 7.8. Per primary.    **thrombocytopenia: Platelets have improved  Likely related to alcohol use  **Hypoalbuminemia: severely low <1.5. Nutrition playing a role but fairly profound.  Liver ultrasound is suggestive of some Liver disease.   Will follow, call with concerns.

## 2021-07-15 NOTE — Progress Notes (Signed)
Mobility Specialist: Progress Note   07/15/21 1550  Mobility  Activity Ambulated in hall  Level of Assistance Standby assist, set-up cues, supervision of patient - no hands on  Assistive Device Front wheel walker  Distance Ambulated (ft) 60 ft  Mobility Ambulated with assistance in hallway  Mobility Response Tolerated well  Mobility performed by Mobility specialist  $Mobility charge 1 Mobility   Pre-Mobility: 93 HR During Mobility:           Sitting: 110/61 (76) BP           Standing: 92/60 (70) BP           Standing 2 minutes: 93/56 (68) BP Post-Mobility: 100 HR, 130/61 (80) BP  Pt agreeable to mobility session upon entering room. Orthostatic vitals taken, pt asymptomatic. Pt back to bed after walk with call bell and phone at his side.   [facility] Najiyah Paris Mobility Specialist Mobility Specialist Phone: [phone]

## 2021-07-15 NOTE — Progress Notes (Signed)
  Interventional radiology were asked to review this case for shoulder aspiration. After reviewing case, Dr. Laurence Ferrari and the [facility] team have reviewed images and recommend not proceeding with left shoulder aspiration at this time. There are no clinical indications for aspiration and they feel that the findings are post traumatic in nature.  Dr. Baxter Flattery and primary RN notified via [facility] chat. Shoulder aspiration order has been deleted. Please contact IR with concerns or questions.    Narda Rutherford, AGNP-BC 07/15/2021, 1:29 PM

## 2021-07-15 NOTE — Progress Notes (Signed)
Progress Note  Patient Name: Joshua Cole Date of Encounter: 07/15/2021  [facility] Cardiologist: Kathlyn Sacramento, MD   Subjective   BP 102/61.  FOBT positive, hemoglobin dropped from 9.1>5.2 yesterday.  Given 1 unit PRBCs with hemoglobin 6.0 today.  Another unit ordered for today.  He reports lightheadedness has improved.  Denies any chest pain or dyspnea.    Inpatient Medications    Scheduled Meds:  aspirin EC  81 mg Oral Daily   Chlorhexidine Gluconate Cloth  6 each Topical Daily   folic acid  1 mg Oral Daily   heparin  5,000 Units Subcutaneous Q8H   insulin aspart  0-15 Units Subcutaneous TID WC   insulin aspart  0-5 Units Subcutaneous QHS   insulin aspart  4 Units Subcutaneous TID WC   insulin glargine-yfgn  25 Units Subcutaneous QHS   lactulose  30 g Oral BID   linagliptin  5 mg Oral Daily   multivitamin  1 tablet Oral Daily   pantoprazole  40 mg Oral BID AC   sodium chloride flush  10-40 mL Intracatheter Q12H   thiamine  100 mg Oral Daily   Continuous Infusions:   ceFAZolin (ANCEF) IV 2 g (07/15/21 0636)   PRN Meds: acetaminophen, alum & mag hydroxide-simeth, LORazepam, magnesium hydroxide, morphine injection, nitroGLYCERIN, ondansetron (ZOFRAN) IV, oxyCODONE, sodium chloride flush   Vital Signs    Vitals:   07/15/21 0400 07/15/21 0500 07/15/21 0535 07/15/21 0600  BP: 109/60 (!) 104/51  102/61  Pulse:      Resp: 16     Temp:      TempSrc:      SpO2:      Weight:   104.8 kg   Height:        Intake/Output Summary (Last 24 hours) at 07/15/2021 0756 Last data filed at 07/15/2021 0710 Gross per 24 hour  Intake 1332 ml  Output 2455 ml  Net -1123 ml    Last 3 Weights 07/15/2021 07/14/2021 07/13/2021  Weight (lbs) 231 lb 0.7 oz 237 lb 9.6 oz 235 lb 0.2 oz  Weight (kg) 104.8 kg 107.775 kg 106.6 kg      Telemetry    Sinus rhythm with first-degree AV block, rate 80s to 90s - Personally Reviewed  ECG    No new EKG- Personally Reviewed  Physical  Exam   GEN: No acute distress.   Neck: No JVD Cardiac: RRR, no murmurs, rubs, or gallops.  Respiratory: Clear to auscultation bilaterally. GI: Soft, nontender, non-distended  MS: No edema; No deformity. Neuro:  Nonfocal  Psych: Normal affect   Labs    High Sensitivity Troponin:  No results for input(s): TROPONINIHS in the last 720 hours.   Chemistry Recent Labs  Lab 07/13/21 0340 07/13/21 1642 07/14/21 0225 07/14/21 1517 07/15/21 0630  NA 125*   < > 121* 124* 129*  K 4.4  --  5.1 5.0 4.6  CL 95*  --  91* 94* 98  CO2 25  --  24 25 27   GLUCOSE 215*  --  270* 255* 197*  BUN 23*  --  47* 64* 39*  CREATININE 0.91  --  1.10 1.16 0.85  CALCIUM 7.6*  --  7.2* 7.4* 7.6*  ALBUMIN <1.5*  --  <1.5*  --  <1.5*  GFRNONAA >60  --  >60 >60 >60  ANIONGAP 5  --  6 5 4*   < > = values in this interval not displayed.     Lipids  No results  for input(s): CHOL, TRIG, HDL, LABVLDL, LDLCALC, CHOLHDL in the last 168 hours.   Hematology Recent Labs  Lab 07/08/21 1747 07/13/21 0340 07/14/21 1833 07/15/21 0630  WBC 9.0 8.9  --  8.8  RBC 3.32* 2.83*  --  1.88*  HGB 10.7* 9.1* 5.2* 6.0*  HCT 31.7* 27.6* 15.7* 17.7*  MCV 95.5 97.5  --  94.1  MCH 32.2 32.2  --  31.9  MCHC 33.8 33.0  --  33.9  RDW 12.8 12.5  --  14.0  PLT 80* 132*  --  147*    Thyroid  No results for input(s): TSH, FREET4 in the last 168 hours.   BNP No results for input(s): BNP, PROBNP in the last 168 hours.   DDimer  No results for input(s): DDIMER in the last 168 hours.    Radiology    MR Shoulder Left W Wo Contrast  Result Date: 07/14/2021 CLINICAL DATA:  Left shoulder pain EXAM: MRI OF THE LEFT SHOULDER WITHOUT AND WITH CONTRAST TECHNIQUE: Multiplanar, multisequence MR imaging of the left shoulder was performed before and after the administration of intravenous contrast. CONTRAST:  1mL GADAVIST GADOBUTROL 1 MMOL/ML IV SOLN COMPARISON:  Left shoulder radiograph 07/12/2021 FINDINGS: Rotator cuff: There is a  full-thickness, near full width tear of the supraspinatus tendon at the footprint (coronal T2 image 12). There is high-grade articular sided tearing of the anterior fibers of the infraspinatus tendon at the footprint (sagittal T2 image 4-6). Mild tendinosis of the cephalad fibers of the subscapularis tendon. The teres minor is intact. Muscles: Intramuscular edema within the infraspinatus and deltoid muscles. No significant muscle atrophy. Biceps Long Head: Intact intra and extra-articular long head biceps tendon. Acromioclavicular Joint: Moderate-severe arthropathy of the acromioclavicular joint. There is distension of the subacromial-subdeltoid bursa with thick peripheral enhancement, short axis measuring up to 0.9 cm. (Coronal T1 post-contrast image 12, sagittal post image 5). Glenohumeral Joint: Trace glenohumeral joint fluid. Mild chondrosis. Labrum: Degenerative superior labral tearing extending anteriorly and posteriorly through the biceps labral anchor. Bones: No fracture or dislocation. No aggressive osseous lesion. No convincing marrow signal changes to suggest osteomyelitis. There is low T1 signal within the scapula, acromion, clavicle, nonspecific. Other: No fluid collection or hematoma. IMPRESSION: Full-thickness, near full width tear of the supraspinatus tendon at the footprint. High-grade articular sided tearing of the anterior fibers of the infraspinatus tendon at the footprint. Mild subscapularis tendinosis of the cephalad fibers. No significant muscle atrophy. Distension of the subacromial-subdeltoid bursa with a peripheral enhancement, short axis measuring up to 0.9 cm. While this could be related to the patient's rotator cuff tear, given adjacent reactive edema in the deltoid and clinical concern for infection, bursa aspiration should be considered. Minimal there is minimal glenohumeral joint fluid and without adjacent marrow signal abnormality to suggest osteomyelitis. Low T1 signal within the  scapula, acromion and clavicle is nonspecific, possibly related to the patient's anemia. Degenerative superior labral tearing anteriorly and posteriorly through the biceps labral anchor. Moderate-severe AC joint arthropathy. Electronically Signed   By: Maurine Simmering M.D.   On: 07/14/2021 16:59    Cardiac Studies   TEE 07/07/2021: 1. Left ventricular ejection fraction, by estimation, is 55 to 60%. The  left ventricle has normal function.   2. Right ventricular systolic function is normal. The right ventricular  size is normal.   3. No left atrial/left atrial appendage thrombus was detected.   4. The mitral valve is normal in structure. Mild mitral valve  regurgitation.   5.  There is a mobile mass attached to the tricuspid valve (clip 132)  consistent with a vegetation given the current clinical context.. The  tricuspid valve is degenerative.   6. The aortic valve is tricuspid. Aortic valve regurgitation is not  visualized.   Conclusion(s)/Recommendation(s): Findings are concerning for  vegetation/infective endocarditis as detailed above.  __________   2D echo 07/04/2021:  1. Left ventricular ejection fraction, by estimation, is 55 to 60%. The  left ventricle has normal function. Left ventricular endocardial border  not optimally defined to evaluate regional wall motion. There is mild left  ventricular hypertrophy. Left  ventricular diastolic parameters are indeterminate.   2. Right ventricular systolic function is normal. The right ventricular  size is normal. Tricuspid regurgitation signal is inadequate for assessing  PA pressure.   3. Left atrial size was mildly dilated.   4. Right atrial size was mildly dilated.   5. The mitral valve is normal in structure. No evidence of mitral valve  regurgitation. No evidence of mitral stenosis.   6. The aortic valve is normal in structure. Aortic valve regurgitation is  not visualized. Mild aortic valve sclerosis is present, with no evidence   of aortic valve stenosis.   7. Aortic dilatation noted. There is borderline dilatation of the aortic  root and of the ascending aorta, measuring 38 mm.   8. The tricuspid was not well visualized. However, there is likely a  mobile vergetation noted on the short axis images. Recommend a TEE.  Patient Profile     60 y.o. male  with gout, alcohol use, bilateral THA, and poor dental hygiene who was admitted to [facility] on 07/03/2021 with MSSA bacteremia and found to have a tricuspid valve vegetation complicated by complete heart block with good escape rhythm who is being seen 07/08/2021 for the evaluation of the above.  Assessment & Plan    MSSA bacteremia with tricuspid valve vegetation with acute metabolic encephalopathy: -- Continue IV Ancef.  PICC line placed -- Appreciate ID recs: recommend IR consult for aspiration in shoulder -- Transferred to [facility] for cardiothoracic surgery evaluation.  Seen by Dr. 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  -- Evaluated by EP (while at [facility]) with no current indication for venous temp wire at this time, given good escape rhythm -- EP recommended pacemaker at time of valve replacement.  D/w EP since not going for surgery, plan outpatient f/u in 6 weeks once treated for IE given his stable escape rhythm -- Avoiding AV nodal blocking agents    Anemia: hemoglobin dropped from 9.1-5.2 yesterday.  FOBT positive.  Given 1 unit PRBCs with hemoglobin 6.0 today.  GI consulted, given another 1 unit PRBC today.  D/w GI, planning EGD/colonoscopy  Respiratory failure: Stable, weaned to RA with stable sats. -- CTA chest negative for PE with concern for multiple ill-defined lesions   Hyponatremia: Improved to 129 today.  Suspected in the setting of ETOH use -- Nephrology following, appreciate recs  Thrombocytopenia: Improving, as low as 57 but improved to 147 -- evaluated by  oncology  -- Suspected to be in the setting of alcohol use   Alcohol use: -- Cessation advised    DM: Newly diagnosed  with A1c 7.8 --Evaluated by diabetic coordinator, appreciate recs: on semglee 23 units QHS + SSI --Needs outpatient follow up with PCP   Arthralgias: -- Evaluated by orthopedics at [facility], did not think hip was infected --  Continue previously ordered PRN oxycodone and morphine   Left arm swelling: Duplex negative for DVT  Disposition: PT/OT recommending SNF but he declines as no insurance.  Will plan home with home health  Consults:  Oncology, ID, Ortho, EP, Nephrology, GI.  D/w [facility], given his complex medical issues Dr Roosevelt Locks states will take on their service, appreciate their assistance  For questions or updates, please contact [place] Please consult [email or web address] for contact info under   Signed, Donato Heinz, MD  07/15/2021, 7:56 AM

## 2021-07-15 NOTE — Progress Notes (Signed)
PROGRESS NOTE    Joshua Cole   TOI:[id number]  DOB: 1961-08-19  PCP: Pcp, No    DOA: 07/08/2021 LOS: 7    Brief Narrative / Hospital Course to Date:   Per Dr. Roosevelt Locks Consult Note: "60 year old male patient with past medical history of gout, alcohol use presented with generalized weakness, bilateral hip pain left shoulder pain, who was admitted to [facility].   Patient was intermittently confused in the ED, blood work showed MSSA bacteremia.  CT angiogram negative for PE, then TEE was positive for tricuspid valve vegetation.  During hospital stay at [facility], patient developed third-degree AV block on 10/26, and transferred to [facility] for further management.   Initial plan was for CT surgery evaluation for TR replacement, CT surgery, evaluated patient and recommended conservative management only, and no surgery.   Patient has been on Ancef since.   For the bilateral hip pain, MRI showed small to moderate right-sided joint effusion.  Infection disease in talking to IR for putative aspiration to rule out septic arthritis.  Patient has mild bilateral hip pain right>left.   Patient was also found to have significant elevation of blood glucose, A1c 7.8.  Patient was started on insulin sliding scale.   Blood work shows old patient has euvolemic hyponatremia likely from alcohol abuse.  Nephrology was on board and started patient on Lasix.  Patient has no history of cirrhosis, acute ultrasound showed fatty liver but no signs of cirrhosis.  Albumin level decreased to 2.7, INR within normal limits.  UA showed no protein."  Assessment & Plan   Active Problems:   Acute metabolic encephalopathy   Generalized weakness   Bacteremia   Acute bacterial endocarditis   Heart block   MSSA bacteremia   MSSA bacteremia with tricuspid valve endocarditis --Cardiology and infectious disease following --Continue Ancef --Repeat echo in 6 to 8 weeks  Acute blood loss anemia, abdominal pain, history of alcohol  abuse -GI is consulted, plans for endoscopy tomorrow --Continue PPI twice daily --Monitor hemoglobin and transfuse if less than 7 11/3: Received 1 unit PRBCs overnight with improvement of hemoglobin from 5.2-6.0 --Transfuse additional unit pRBCs today --Check posttransfusion H&H  Complete heart block -PPM initially planned with possible tricuspid valve replacement.  Cardiology and EP are following.  Valve replacement not currently planned.  Monitor on telemetry.  Hyponatremia, euvolemic -nephrology consulted.  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 --Fluid restriction 1500 cc/day  Hypoalbuminemia -UA without proteinuria, right upper quadrant ultrasound showed no signs of cirrhosis, but given history of alcohol abuse could be related to liver dysfunction.  Monitor.  Thrombocytopenia -most likely related to alcohol abuse, possibly liver disease.  Platelet count improving. Follow CBC.  Insulin-dependent type 2 diabetes -uncontrolled.  Started on sliding scale insulin and Tradjenta for underlying insulin resistance.  Alcohol abuse -with most recent drink more than 10 days ago, no signs or symptoms of active withdrawal.  Abdominal imaging showed hepatic steatosis, no signs of cirrhosis at this point.  INR within normal limits. Patient counseled on importance of alcohol cessation.  Right hip pain - MRI of pelvis showed moderate right hip joint effusion with right-sided periprostatic fluid collection tracking posteriorly along the greater trochanter.  No marrow signal changes to suggest osteomyelitis.  Hip joint aspiration was recommended if concern for infected joint.  ID following as above.  Symmetric bilateral intramuscular edema involving the gluteal musculature and adductor muscles, consistent with non-specific myositis. No evidence of pyomyositis.   Patient BMI: Body mass index is 33.15 kg/m.  DVT prophylaxis: Place TED hose Start: 07/14/21 1245 heparin injection 5,000 Units Start: 07/08/21  2200   Diet:  Diet Orders (From admission, onward)     Start     Ordered   07/15/21 0709  Diet NPO time specified  Diet effective now        07/15/21 8891              Code Status: Full Code   Subjective 07/15/21    Pt up in recliner this AM.  Reports GI planning to take him for scopes tomorrow.  Currently on clear liquid diet and tolerating.  Denies any dizziness, lightheadedness, shortness of breath, chest pain fevers chills or other acute complaints.     Disposition Plan & Communication   Status is: Inpatient  Remains inpatient appropriate because: Ongoing GI evaluation, requiring blood transfusions     Consults, Procedures, Significant Events   Consultants:  Cardiology Gastroenterology  Procedures:    Antimicrobials:  Anti-infectives (From admission, onward)    Start     Dose/Rate Route Frequency Ordered Stop   07/08/21 2200  ceFAZolin (ANCEF) IVPB 2g/100 mL premix        2 g 200 mL/hr over 30 Minutes Intravenous Every 8 hours 07/08/21 1725           Micro    Objective   Vitals:   07/15/21 0400 07/15/21 0500 07/15/21 0535 07/15/21 0600  BP: 109/60 (!) 104/51  102/61  Pulse:      Resp: 16     Temp:      TempSrc:      SpO2:      Weight:   104.8 kg   Height:        Intake/Output Summary (Last 24 hours) at 07/15/2021 0803 Last data filed at 07/15/2021 0710 Gross per 24 hour  Intake 1332 ml  Output 2455 ml  Net -1123 ml   Filed Weights   07/13/21 0350 07/14/21 0520 07/15/21 0535  Weight: 106.6 kg 107.8 kg 104.8 kg    Physical Exam:  General exam: awake, alert, no acute distress HEENT: atraumatic, clear conjunctiva, anicteric sclera, moist mucus membranes, hearing grossly normal  Respiratory system: CTAB, no wheezes, rales or rhonchi, normal respiratory effort. Cardiovascular system: normal S1/S2, RRR, no pedal edema.   Gastrointestinal system: soft, NT, ND, no HSM felt, +bowel sounds. Central nervous system: A&O x3. no gross focal  neurologic deficits, normal speech Extremities: moves all, no edema, normal tone Psychiatry: normal mood, congruent affect, judgement and insight appear normal  Labs   Data Reviewed: I have personally reviewed following labs and imaging studies  CBC: Recent Labs  Lab 07/08/21 1747 07/13/21 0340 07/14/21 1833 07/15/21 0630  WBC 9.0 8.9  --  8.8  HGB 10.7* 9.1* 5.2* 6.0*  HCT 31.7* 27.6* 15.7* 17.7*  MCV 95.5 97.5  --  94.1  PLT 80* 132*  --  694*   Basic Metabolic Panel: Recent Labs  Lab 07/11/21 1323 07/12/21 0059 07/12/21 1633 07/13/21 0340 07/13/21 1642 07/14/21 0225 07/14/21 1517 07/15/21 0630  NA 123* 121*   < > 125* 125* 121* 124* 129*  K 4.5 4.3  --  4.4  --  5.1 5.0 4.6  CL 93* 92*  --  95*  --  91* 94* 98  CO2 24 25  --  25  --  24 25 27   GLUCOSE 251* 136*  --  215*  --  270* 255* 197*  BUN 14 11  --  23*  --  47* 64* 39*  CREATININE 1.02 0.96  --  0.91  --  1.10 1.16 0.85  CALCIUM 7.1* 7.1*  --  7.6*  --  7.2* 7.4* 7.6*  PHOS 3.4 2.7  --  3.2  --  3.3  --  3.9   < > = values in this interval not displayed.   GFR: Estimated Creatinine Clearance: 114.8 mL/min (by C-G formula based on SCr of 0.85 mg/dL). Liver Function Tests: Recent Labs  Lab 07/11/21 1323 07/12/21 0059 07/13/21 0340 07/14/21 0225 07/15/21 0630  ALBUMIN <1.5* <1.5* <1.5* <1.5* <1.5*   No results for input(s): LIPASE, AMYLASE in the last 168 hours. No results for input(s): AMMONIA in the last 168 hours. Coagulation Profile: No results for input(s): INR, PROTIME in the last 168 hours. Cardiac Enzymes: No results for input(s): CKTOTAL, CKMB, CKMBINDEX, TROPONINI in the last 168 hours. BNP (last 3 results) No results for input(s): PROBNP in the last 8760 hours. HbA1C: No results for input(s): HGBA1C in the last 72 hours. CBG: Recent Labs  Lab 07/14/21 0626 07/14/21 1117 07/14/21 1600 07/14/21 2129 07/15/21 0622  GLUCAP 276* 221* 284* 248* 185*   Lipid Profile: No results  for input(s): CHOL, HDL, LDLCALC, TRIG, CHOLHDL, LDLDIRECT in the last 72 hours. Thyroid Function Tests: No results for input(s): TSH, T4TOTAL, FREET4, T3FREE, THYROIDAB in the last 72 hours. Anemia Panel: No results for input(s): VITAMINB12, FOLATE, FERRITIN, TIBC, IRON, RETICCTPCT in the last 72 hours. Sepsis Labs: No results for input(s): PROCALCITON, LATICACIDVEN in the last 168 hours.  No results found for this or any previous visit (from the past 240 hour(s)).    Imaging Studies   MR Shoulder Left W Wo Contrast  Result Date: 07/14/2021 CLINICAL DATA:  Left shoulder pain EXAM: MRI OF THE LEFT SHOULDER WITHOUT AND WITH CONTRAST TECHNIQUE: Multiplanar, multisequence MR imaging of the left shoulder was performed before and after the administration of intravenous contrast. CONTRAST:  52mL GADAVIST GADOBUTROL 1 MMOL/ML IV SOLN COMPARISON:  Left shoulder radiograph 07/12/2021 FINDINGS: Rotator cuff: There is a full-thickness, near full width tear of the supraspinatus tendon at the footprint (coronal T2 image 12). There is high-grade articular sided tearing of the anterior fibers of the infraspinatus tendon at the footprint (sagittal T2 image 4-6). Mild tendinosis of the cephalad fibers of the subscapularis tendon. The teres minor is intact. Muscles: Intramuscular edema within the infraspinatus and deltoid muscles. No significant muscle atrophy. Biceps Long Head: Intact intra and extra-articular long head biceps tendon. Acromioclavicular Joint: Moderate-severe arthropathy of the acromioclavicular joint. There is distension of the subacromial-subdeltoid bursa with thick peripheral enhancement, short axis measuring up to 0.9 cm. (Coronal T1 post-contrast image 12, sagittal post image 5). Glenohumeral Joint: Trace glenohumeral joint fluid. Mild chondrosis. Labrum: Degenerative superior labral tearing extending anteriorly and posteriorly through the biceps labral anchor. Bones: No fracture or dislocation.  No aggressive osseous lesion. No convincing marrow signal changes to suggest osteomyelitis. There is low T1 signal within the scapula, acromion, clavicle, nonspecific. Other: No fluid collection or hematoma. IMPRESSION: Full-thickness, near full width tear of the supraspinatus tendon at the footprint. High-grade articular sided tearing of the anterior fibers of the infraspinatus tendon at the footprint. Mild subscapularis tendinosis of the cephalad fibers. No significant muscle atrophy. Distension of the subacromial-subdeltoid bursa with a peripheral enhancement, short axis measuring up to 0.9 cm. While this could be related to the patient's rotator cuff tear, given adjacent reactive edema in the deltoid and clinical concern for infection, bursa aspiration  should be considered. Minimal there is minimal glenohumeral joint fluid and without adjacent marrow signal abnormality to suggest osteomyelitis. Low T1 signal within the scapula, acromion and clavicle is nonspecific, possibly related to the patient's anemia. Degenerative superior labral tearing anteriorly and posteriorly through the biceps labral anchor. Moderate-severe AC joint arthropathy. Electronically Signed   By: Maurine Simmering M.D.   On: 07/14/2021 16:59     Medications   Scheduled Meds:  aspirin EC  81 mg Oral Daily   Chlorhexidine Gluconate Cloth  6 each Topical Daily   folic acid  1 mg Oral Daily   heparin  5,000 Units Subcutaneous Q8H   insulin aspart  0-15 Units Subcutaneous TID WC   insulin aspart  0-5 Units Subcutaneous QHS   insulin aspart  4 Units Subcutaneous TID WC   insulin glargine-yfgn  25 Units Subcutaneous QHS   lactulose  30 g Oral BID   linagliptin  5 mg Oral Daily   multivitamin  1 tablet Oral Daily   pantoprazole  40 mg Oral BID AC   sodium chloride flush  10-40 mL Intracatheter Q12H   thiamine  100 mg Oral Daily   Continuous Infusions:   ceFAZolin (ANCEF) IV 2 g (07/15/21 0636)       LOS: 7 days    Time spent:  30 minutes    Ezekiel Slocumb, DO [facility]  07/15/2021, 8:03 AM      If 7PM-7AM, please contact night-coverage. How to contact the [facility] Attending or Consulting provider [place] or covering provider during after hours [place], for this patient?    Check the care team in [facility] and look for a) attending/consulting [facility] provider listed and b) the [facility] team listed Log into [email or web address] and use [place] universal password to access. If you do not have the password, please contact the hospital operator. Locate the [facility] provider you are looking for under [facility] and page to a number that you can be directly reached. If you still have difficulty reaching the provider, please page the [facility] (Director on Call) for the Hospitalists listed on amion for assistance.

## 2021-07-15 NOTE — Progress Notes (Signed)
OT Cancellation Note  Patient Details Name: Joshua Cole MRN: [id number] DOB: [birth year]   Cancelled Treatment:    Reason Eval/Treat Not Completed: Medical issues which prohibited therapy. Hgb of 6.0 and orthostatic per RN.  [name] 07/15/2021, 8:59 AM Nestor Lewandowsky, OTR/L Acute Rehabilitation Services Pager: [phone] Office: [phone]

## 2021-07-15 NOTE — Progress Notes (Signed)
TRH night cross cover note:  Notified by RN of decline in hemoglobin to 5.2, in the context of prior Hemoccult positive finding.  Currently appears hemodynamically stable, and otherwise asymptomatic.  I ordered transfusion of 1 unit PRBC over 3 hours, with plan for ensuing trend in hemoglobin per repeat CBC ordered for the morning.     Babs Bertin, DO Hospitalist

## 2021-07-16 ENCOUNTER — Encounter (HOSPITAL_COMMUNITY)
Admission: AD | Disposition: A | Discharge status: Home Health | Payer: Self-pay | Source: Other Acute Inpatient Hospital | Attending: Cardiovascular Disease

## 2021-07-16 ENCOUNTER — Inpatient Hospital Stay (HOSPITAL_COMMUNITY): Payer: Self-pay | Admitting: Anesthesiology

## 2021-07-16 ENCOUNTER — Encounter (HOSPITAL_COMMUNITY): Payer: Self-pay | Admitting: Internal Medicine

## 2021-07-16 DIAGNOSIS — Z860100 Personal history of colon polyps, unspecified: Secondary | ICD-10-CM

## 2021-07-16 DIAGNOSIS — D123 Benign neoplasm of transverse colon: Secondary | ICD-10-CM

## 2021-07-16 DIAGNOSIS — D124 Benign neoplasm of descending colon: Secondary | ICD-10-CM

## 2021-07-16 DIAGNOSIS — K269 Duodenal ulcer, unspecified as acute or chronic, without hemorrhage or perforation: Secondary | ICD-10-CM

## 2021-07-16 DIAGNOSIS — K2970 Gastritis, unspecified, without bleeding: Secondary | ICD-10-CM

## 2021-07-16 DIAGNOSIS — K2210 Ulcer of esophagus without bleeding: Secondary | ICD-10-CM

## 2021-07-16 HISTORY — PX: COLONOSCOPY WITH PROPOFOL: SHX5780

## 2021-07-16 HISTORY — PX: BIOPSY: SHX5522

## 2021-07-16 HISTORY — PX: ESOPHAGOGASTRODUODENOSCOPY (EGD) WITH PROPOFOL: SHX5813

## 2021-07-16 HISTORY — PX: POLYPECTOMY: SHX5525

## 2021-07-16 HISTORY — PX: COLONOSCOPY: SHX5424

## 2021-07-16 LAB — CBC
HCT: 19.2 % — ABNORMAL LOW (ref 39.0–52.0)
Hemoglobin: 6.4 g/dL — CL (ref 13.0–17.0)
MCH: 32 pg (ref 26.0–34.0)
MCHC: 33.3 g/dL (ref 30.0–36.0)
MCV: 96 fL (ref 80.0–100.0)
Platelets: 140 10*3/uL — ABNORMAL LOW (ref 150–400)
RBC: 2 MIL/uL — ABNORMAL LOW (ref 4.22–5.81)
RDW: 14.6 % (ref 11.5–15.5)
WBC: 7 10*3/uL (ref 4.0–10.5)
nRBC: 0 % (ref 0.0–0.2)

## 2021-07-16 LAB — MAGNESIUM: Magnesium: 2.3 mg/dL (ref 1.7–2.4)

## 2021-07-16 LAB — PREPARE RBC (CROSSMATCH)

## 2021-07-16 LAB — RENAL FUNCTION PANEL
Albumin: 1.6 g/dL — ABNORMAL LOW (ref 3.5–5.0)
Anion gap: 5 (ref 5–15)
BUN: 13 mg/dL (ref 6–20)
CO2: 27 mmol/L (ref 22–32)
Calcium: 7.5 mg/dL — ABNORMAL LOW (ref 8.9–10.3)
Chloride: 100 mmol/L (ref 98–111)
Creatinine, Ser: 0.71 mg/dL (ref 0.61–1.24)
GFR, Estimated: >60 mL/min (ref >60)
Glucose, Bld: 112 mg/dL — ABNORMAL HIGH (ref 70–99)
Phosphorus: 4.3 mg/dL (ref 2.5–4.6)
Potassium: 3.6 mmol/L (ref 3.5–5.1)
Sodium: 132 mmol/L — ABNORMAL LOW (ref 135–145)

## 2021-07-16 LAB — GLUCOSE, CAPILLARY
Glucose-Capillary: 142 mg/dL — ABNORMAL HIGH (ref 70–99)
Glucose-Capillary: 114 mg/dL — ABNORMAL HIGH (ref 70–99)
Glucose-Capillary: 258 mg/dL — ABNORMAL HIGH (ref 70–99)
Glucose-Capillary: 119 mg/dL — ABNORMAL HIGH (ref 70–99)

## 2021-07-16 SURGERY — ESOPHAGOGASTRODUODENOSCOPY (EGD) WITH PROPOFOL
Anesthesia: Monitor Anesthesia Care

## 2021-07-16 MED ORDER — SODIUM CHLORIDE 0.9% IV SOLUTION: Freq: Once | INTRAVENOUS | Status: AC

## 2021-07-16 MED ORDER — LIDOCAINE 2% (20 MG/ML) 5 ML SYRINGE
INTRAMUSCULAR | Status: DC | PRN
  Administered 2021-07-16: 80 mg via INTRAVENOUS

## 2021-07-16 MED ORDER — PROPOFOL 500 MG/50ML IV EMUL
INTRAVENOUS | Status: DC | PRN
  Administered 2021-07-16: 100 ug/kg/min via INTRAVENOUS

## 2021-07-16 MED ORDER — PHENYLEPHRINE 40 MCG/ML (10ML) SYRINGE FOR IV PUSH (FOR BLOOD PRESSURE SUPPORT)
PREFILLED_SYRINGE | INTRAVENOUS | Status: DC | PRN
  Administered 2021-07-16 (×2): 120 ug via INTRAVENOUS
  Administered 2021-07-16 (×2): 80 ug via INTRAVENOUS

## 2021-07-16 MED ORDER — CEFAZOLIN IV (FOR PTA / DISCHARGE USE ONLY): 2.0000 g | Freq: Three times a day (TID) | INTRAVENOUS | 0 refills | Status: AC

## 2021-07-16 MED ORDER — PROPOFOL 10 MG/ML IV BOLUS
INTRAVENOUS | Status: DC | PRN
  Administered 2021-07-16 (×3): 30 mg via INTRAVENOUS

## 2021-07-16 MED ORDER — LACTATED RINGERS IV SOLN: INTRAVENOUS | Status: DC | PRN

## 2021-07-16 SURGICAL SUPPLY — 25 items

## 2021-07-16 NOTE — Progress Notes (Signed)
Date and time results received: 07/16/21 12:24 PM  (use smartphrase ".now" to insert current time)  Test: hh Critical Value: 6.4   Name of Provider Notified: Chiquita Loth  Orders Received? Or Actions Taken?: Orders Received - See Orders for details

## 2021-07-16 NOTE — Progress Notes (Signed)
Mobility Specialist: Progress Note   07/16/21 1227  Mobility  Activity Ambulated in hall  Level of Assistance Minimal assist, patient does 75% or more  Assistive Device Front wheel walker  Distance Ambulated (ft) 170 ft  Mobility Ambulated with assistance in hallway  Mobility Response Tolerated well  Mobility performed by Mobility specialist  Bed Position Chair  $Mobility charge 1 Mobility   Pre-Mobility: 96 HR, 126/55 BP Post-Mobility: 83 HR, 126/56 BP  Pt c/o feeling a little SOB after returning to room, otherwise no c/o throughout. Pt to the recliner after walk per request with call bell and phone in reach. Encouraged continued ambulation with staff this afternoon.  [facility] Joshua Cole Mobility Specialist Mobility Specialist Phone: [phone]

## 2021-07-16 NOTE — Op Note (Signed)
[facility] Patient Name: Joshua Cole Procedure Date : 07/16/2021 MRN: [id number] Attending MD: Georgian Co ,  Date of Birth: 05-12-1961 CSN: [id number] Age: 60 Admit Type: Inpatient Procedure:                Colonoscopy Indications:              Anemia Providers:                Adline Mango" Georga Hacking, RN, Jeanella Cara, RN, Lodema Hong Technician,                            Technician Referring MD:             Hospitalist team Medicines:                Monitored Anesthesia Care Complications:            No immediate complications. Estimated Blood Loss:     Estimated blood loss was minimal. Procedure:                Pre-Anesthesia Assessment:                           - Prior to the procedure, a History and Physical                            was performed, and patient medications and                            allergies were reviewed. The patient's tolerance of                            previous anesthesia was also reviewed. The risks                            and benefits of the procedure and the sedation                            options and risks were discussed with the patient.                            All questions were answered, and informed consent                            was obtained. Prior Anticoagulants: The patient has                            taken no previous anticoagulant or antiplatelet                            agents. ASA Grade Assessment: III - A patient with                            severe systemic disease.  After reviewing the risks                            and benefits, the patient was deemed in                            satisfactory condition to undergo the procedure.                           After obtaining informed consent, the colonoscope                            was passed under direct vision. Throughout the                            procedure, the patient's  blood pressure, pulse, and                            oxygen saturations were monitored continuously. The                            [id number]-HQ190L ([id number]) Olympus colonoscope was                            introduced through the anus and advanced to the the                            terminal ileum. The colonoscopy was performed                            without difficulty. The patient tolerated the                            procedure well. The quality of the bowel                            preparation was adequate. Scope In: 9:52:31 AM Scope Out: 10:13:07 AM Scope Withdrawal Time: 0 hours 18 minutes 30 seconds  Total Procedure Duration: 0 hours 20 minutes 36 seconds  Findings:      The terminal ileum appeared normal.      Two sessile polyps were found in the transverse colon and cecum. The       polyps were 2 mm in size. These polyps were removed with a jumbo cold       forceps. Resection and retrieval were complete.      Two sessile polyps were found in the descending colon and transverse       colon. The polyps were 3 to 4 mm in size. These polyps were removed with       a cold snare and/or forceps. Resection and retrieval were complete.      Non-bleeding internal hemorrhoids were found during retroflexion. Impression:               - The examined portion of the ileum was normal.                           -  Two 2 mm polyps in the transverse colon and in                            the cecum, removed with a jumbo cold forceps.                            Resected and retrieved.                           - Two 3 to 4 mm polyps in the descending colon and                            in the transverse colon, removed with a cold snare.                            Resected and retrieved.                           - Non-bleeding internal hemorrhoids. Recommendation:           - Return patient to hospital ward for ongoing care.                           - It is suspected that the patient's  anemia is due                            to the large duodenal ulcers seen during today's                            examination.                           - Await pathology results.                           - The findings and recommendations were discussed                            with the patient. Procedure Code(s):        --- Professional ---                           [phone], Colonoscopy, flexible; with removal of                            tumor(s), polyp(s), or other lesion(s) by snare                            technique                           45380, 59, Colonoscopy, flexible; with biopsy,                            single or multiple Diagnosis Code(s):        ---  Professional ---                           K64.8, Other hemorrhoids                           K63.5, Polyp of colon                           D64.9, Anemia, unspecified CPT copyright 2019 American Medical Association. All rights reserved. The codes documented in this report are preliminary and upon coder review may  be revised to meet current compliance requirements. Sonny Masters "Christia Reading,  07/16/2021 11:18:28 AM Number of Addenda: 0

## 2021-07-16 NOTE — Progress Notes (Signed)
OT Cancellation Note  Patient Details Name: Joshua Cole MRN: [id number] DOB: [birth year]   Cancelled Treatment:    Reason Eval/Treat Not Completed: Patient at procedure or test/ unavailable  [name] 07/16/2021, 8:10 AM Nestor Lewandowsky, OTR/L Acute Rehabilitation Services Pager: [phone] Office: [phone]

## 2021-07-16 NOTE — Anesthesia Postprocedure Evaluation (Signed)
Anesthesia Post Note  Patient: Joshua Cole  Procedure(s) Performed: ESOPHAGOGASTRODUODENOSCOPY (EGD) WITH PROPOFOL BIOPSY POLYPECTOMY COLONOSCOPY WITH PROPOFOL     Patient location during evaluation: PACU Anesthesia Type: MAC Level of consciousness: awake and alert Pain management: pain level controlled Vital Signs Assessment: post-procedure vital signs reviewed and stable Respiratory status: spontaneous breathing, nonlabored ventilation, respiratory function stable and patient connected to nasal cannula oxygen Cardiovascular status: stable and blood pressure returned to baseline Postop Assessment: no apparent nausea or vomiting Anesthetic complications: no   No notable events documented.  Last Vitals:  Vitals:   07/16/21 1023 07/16/21 1037  BP: (!) 94/55 (!) 103/51  Pulse: 85 84  Resp: 17 16  Temp: 36.4 C 36.5 C  SpO2: 100% 100%    Last Pain:  Vitals:   07/16/21 1023  TempSrc:   PainSc: 0-No pain                 Araya Roel L Karrissa Parchment

## 2021-07-16 NOTE — Op Note (Addendum)
[facility] Patient Name: Joshua Cole Procedure Date : 07/16/2021 MRN: [id number] Attending MD: Georgian Co ,  Date of Birth: 11-05-1960 CSN: [id number] Age: 60 Admit Type: Inpatient Procedure:                Upper GI endoscopy Indications:              Anemia Providers:                Adline Mango" Georga Hacking, RN, Jeanella Cara, RN, Lodema Hong Technician,                            Technician Referring MD:             Hospitalist team Medicines:                Monitored Anesthesia Care Complications:            No immediate complications. Estimated Blood Loss:     Estimated blood loss was minimal. Procedure:                Pre-Anesthesia Assessment:                           - Prior to the procedure, a History and Physical                            was performed, and patient medications and                            allergies were reviewed. The patient's tolerance of                            previous anesthesia was also reviewed. The risks                            and benefits of the procedure and the sedation                            options and risks were discussed with the patient.                            All questions were answered, and informed consent                            was obtained. Prior Anticoagulants: The patient has                            taken no previous anticoagulant or antiplatelet                            agents. ASA Grade Assessment: III - A patient with                            severe  systemic disease. After reviewing the risks                            and benefits, the patient was deemed in                            satisfactory condition to undergo the procedure.                           After obtaining informed consent, the endoscope was                            passed under direct vision. Throughout the                            procedure, the  patient's blood pressure, pulse, and                            oxygen saturations were monitored continuously. The                            [id number]-H190 ([id number]) Olympus endoscope was introduced                            through the mouth, and advanced to the second part                            of duodenum. The upper GI endoscopy was                            accomplished without difficulty. The patient                            tolerated the procedure well. Scope In: Scope Out: Findings:      One cratered esophageal ulcer with no bleeding and no stigmata of recent       bleeding was found in the distal esophagus. The lesion was 8 mm in       largest dimension.      Scattered inflammation characterized by congestion (edema), erosions and       erythema was found in the gastric body and in the gastric antrum.       Biopsies were taken with a cold forceps for Helicobacter pylori testing.      Three non-bleeding cratered duodenal ulcers with a clean ulcer base       (Forrest Class III) were found in the duodenal bulb. The largest lesion       was 15 mm in largest dimension. Impression:               - Esophageal ulcer with no bleeding and no stigmata                            of recent bleeding.                           - Gastritis. Biopsied.                           -  Non-bleeding duodenal ulcers with a clean ulcer                            base (Forrest Class III). Recommendation:           - Await pathology results.                           - Use a proton pump inhibitor PO BID for 8 weeks.                           - Use sucralfate suspension 1 gram PO QID for 4                            weeks.                           - No aspirin, ibuprofen, naproxen, or other                            non-steroidal anti-inflammatory drugs.                           - Repeat upper endoscopy in 8 weeks to check                            healing. Patient states that he is not sure if he                             wishes to follow up with [facility] and may want to                            follow up with someone locally. Please let us know                            prior to discharge if the patient wishes for [place] to                            arrange a follow up EGD.                           - Perform a colonoscopy today. Procedure Code(s):        --- Professional ---                           [phone], Esophagogastroduodenoscopy, flexible,                            transoral; with biopsy, single or multiple Diagnosis Code(s):        --- Professional ---                           K22.10, Ulcer of esophagus without bleeding  K29.70, Gastritis, unspecified, without bleeding                           K26.9, Duodenal ulcer, unspecified as acute or                            chronic, without hemorrhage or perforation                           D64.9, Anemia, unspecified CPT copyright 2019 American Medical Association. All rights reserved. The codes documented in this report are preliminary and upon coder review may  be revised to meet current compliance requirements. Joshua Cole "Christia Reading,  07/16/2021 11:19:14 AM Number of Addenda: 0

## 2021-07-16 NOTE — Anesthesia Preprocedure Evaluation (Addendum)
Anesthesia Evaluation  Patient identified by MRN, date of birth, ID band Patient awake    Reviewed: Allergy & Precautions, NPO status , Patient's Chart, lab work & pertinent test results  Airway Mallampati: III  TM Distance: >3 FB Neck ROM: Full  Mouth opening: Limited Mouth Opening  Dental  (+) Poor Dentition, Dental Advisory Given   Pulmonary neg pulmonary ROS,    Pulmonary exam normal breath sounds clear to auscultation       Cardiovascular negative cardio ROS Normal cardiovascular exam Rhythm:Regular Rate:Normal  TEE 07/07/2021: 1. Left ventricular ejection fraction, by estimation, is 55 to 60%. The  left ventricle has normal function.  2. Right ventricular systolic function is normal. The right ventricular  size is normal.  3. No left atrial/left atrial appendage thrombus was detected.  4. The mitral valve is normal in structure. Mild mitral valve  regurgitation.  5. There is a mobile mass attached to the tricuspid valve (clip 132)  consistent with a vegetation given the current clinical context.. The  tricuspid valve is degenerative.  6. The aortic valve is tricuspid. Aortic valve regurgitation is not  visualized.    Neuro/Psych negative neurological ROS  negative psych ROS   GI/Hepatic negative GI ROS, (+)     substance abuse  alcohol use,   Endo/Other    Renal/GU negative Renal ROS  negative genitourinary   Musculoskeletal  (+) Arthritis ,   Abdominal   Peds  Hematology  (+) Blood dyscrasia, anemia , Lab Results      Component                Value               Date                      WBC                      8.8                 07/15/2021                HGB                      7.3 (L)             07/15/2021                HCT                      21.2 (L)            07/15/2021                MCV                      94.1                07/15/2021                PLT                      147  (L)             07/15/2021              Anesthesia Other Findings Admitted to [facility] on 07/03/2021 with MSSA bacteremia and found to have a tricuspid valve vegetation complicated by  complete heart block. Recc medical management. Course c/b GIB, hyponatremia  Reproductive/Obstetrics                            Anesthesia Physical Anesthesia Plan  ASA: 3  Anesthesia Plan: MAC   Post-op Pain Management:    Induction: Intravenous  PONV Risk Score and Plan: Propofol infusion and Treatment may vary due to age or medical condition  Airway Management Planned: Natural Airway  Additional Equipment:   Intra-op Plan:   Post-operative Plan:   Informed Consent: I have reviewed the patients History and Physical, chart, labs and discussed the procedure including the risks, benefits and alternatives for the proposed anesthesia with the patient or authorized representative who has indicated his/her understanding and acceptance.     Dental advisory given  Plan Discussed with: CRNA  Anesthesia Plan Comments:         Anesthesia Quick Evaluation

## 2021-07-16 NOTE — Progress Notes (Signed)
PROGRESS NOTE    Joshua Cole   WUJ:[id number]  DOB: May 02, 1961  PCP: Pcp, No    DOA: 07/08/2021 LOS: 8    Brief Narrative / Hospital Course to Date:   Per Dr. Roosevelt Locks Consult Note: "60 year old male patient with past medical history of gout, alcohol use presented with generalized weakness, bilateral hip pain left shoulder pain, who was admitted to [facility].   Patient was intermittently confused in the ED, blood work showed MSSA bacteremia.  CT angiogram negative for PE, then TEE was positive for tricuspid valve vegetation.  During hospital stay at [facility], patient developed third-degree AV block on 10/26, and transferred to [facility] for further management.   Initial plan was for CT surgery evaluation for TR replacement, CT surgery, evaluated patient and recommended conservative management only, and no surgery.   Patient has been on Ancef since.   For the bilateral hip pain, MRI showed small to moderate right-sided joint effusion.  Patient has mild bilateral hip pain right>left.   Patient was also found to have significant elevation of blood glucose, A1c 7.8.  Patient was started on insulin sliding scale.   Blood work shows old patient has euvolemic hyponatremia likely from alcohol abuse.  Nephrology was on board and started patient on Lasix.  Patient has no history of cirrhosis, acute ultrasound showed fatty liver but no signs of cirrhosis.  Albumin level decreased to 2.7, INR within normal limits.  UA showed no protein."  Assessment & Plan   Active Problems:   Acute metabolic encephalopathy   Generalized weakness   Bacteremia   Acute bacterial endocarditis   Heart block   MSSA bacteremia   Acute blood loss anemia   Heart block AV complete (HCC)   Abnormal LFTs   Duodenal ulcer   History of colonic polyps   MSSA bacteremia with tricuspid valve endocarditis --Cardiology and infectious disease following --Continue Ancef --Repeat echo in 6 to 8 weeks  Acute blood loss anemia,  abdominal pain, history of alcohol abuse /esophageal and duodenal ulcers, gastritis -  --Continue PPI twice daily -- --Monitor hemoglobin and transfuse if less than 7 11/3: Received 2 unit PRBCs overnight with improvement of hemoglobin from 5.2-6.0>>7.3 11/4: Hemoglobin this morning 6.4 EGD and colonoscopy done this morning 11/4.  EGD showed nonbleeding esophageal ulcer and multiple duodenal ulcers in addition to gastritis.  Ulcers thought to be source of patient's blood loss although not actively bleeding at the time of evaluation. --Transfuse additional 2 units RBCs today --Check posttransfusion H&H  Complete heart block -PPM initially planned with possible tricuspid valve replacement.  Cardiology and EP are following.  Valve replacement not currently planned.  Monitor on telemetry.  Hyponatremia, euvolemic -nephrology consulted.  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 --Fluid restriction 1500 cc/day  Hypoalbuminemia -UA without proteinuria, right upper quadrant ultrasound showed no signs of cirrhosis, but given history of alcohol abuse could be related to liver dysfunction.  Monitor.  Thrombocytopenia -most likely related to alcohol abuse, possibly liver disease.  Platelet count improving. Follow CBC.  Insulin-dependent type 2 diabetes -uncontrolled.  Started on sliding scale insulin and Tradjenta for underlying insulin resistance.  Alcohol abuse -with most recent drink more than 10 days ago, no signs or symptoms of active withdrawal.  Abdominal imaging showed hepatic steatosis, no signs of cirrhosis at this point.  INR within normal limits. Patient counseled on importance of alcohol cessation.  Right hip pain - MRI of pelvis showed moderate right hip joint effusion with right-sided periprostatic fluid collection tracking posteriorly  along the greater trochanter.  No marrow signal changes to suggest osteomyelitis.  Hip joint aspiration was recommended if concern for infected joint.  ID following as  above.  Symmetric bilateral intramuscular edema involving the gluteal musculature and adductor muscles, consistent with non-specific myositis. No evidence of pyomyositis.   Patient BMI: Body mass index is 33.15 kg/m.   DVT prophylaxis: Place and maintain sequential compression device Start: 07/15/21 1045 Place TED hose Start: 07/14/21 1245   Diet:  Diet Orders (From admission, onward)     Start     Ordered   07/16/21 1025  Diet Heart Room service appropriate? Yes; Fluid consistency: Thin  Diet effective now       Question Answer Comment  Room service appropriate? Yes   Fluid consistency: Thin      07/16/21 1024              Code Status: Full Code   Subjective 07/16/21    Pt seen after going for endoscopies this morning.  He reports some mild epigastric pain but no nausea vomiting.  He has not noticed any bloody or dark tarry stools.  Discussed avoidance of NSAIDs which he uses chronically for back pain as these can cause ulcers.  Denies other acute complaints.   Disposition Plan & Communication   Status is: Inpatient  Remains inpatient appropriate because: Ongoing acute blood loss anemia requiring transfusion.   Consults, Procedures, Significant Events   Consultants:  Cardiology Gastroenterology  Procedures:    Antimicrobials:  Anti-infectives (From admission, onward)    Start     Dose/Rate Route Frequency Ordered Stop   07/16/21 0000  ceFAZolin (ANCEF) IVPB        2 g Intravenous Every 8 hours 07/16/21 1233 08/17/21 2359   07/08/21 2200  ceFAZolin (ANCEF) IVPB 2g/100 mL premix        2 g 200 mL/hr over 30 Minutes Intravenous Every 8 hours 07/08/21 1725           Micro    Objective   Vitals:   07/16/21 0800 07/16/21 1023 07/16/21 1037 07/16/21 1100  BP: (!) 102/47 (!) 94/55 (!) 103/51 (!) 102/53  Pulse: 76 85 84 87  Resp: 18 17 16 18   Temp: 98.2 F (36.8 C) 97.6 F (36.4 C) 97.7 F (36.5 C) 97.7 F (36.5 C)  TempSrc: Temporal   Oral   SpO2: 99% 100% 100% 100%  Weight: 104.8 kg     Height: 5\' 10"  (1.778 m)       Intake/Output Summary (Last 24 hours) at 07/16/2021 1433 Last data filed at 07/16/2021 1114 Gross per 24 hour  Intake 700 ml  Output 401 ml  Net 299 ml   Filed Weights   07/15/21 0535 07/16/21 0512 07/16/21 0800  Weight: 104.8 kg 104.8 kg 104.8 kg    Physical Exam:  General exam: awake, alert, no acute distress HEENT: atraumatic, clear conjunctiva, anicteric sclera, moist mucus membranes, hearing grossly normal  Respiratory system: Normal respiratory effort, on room air. Cardiovascular system: Regular rate and rhythm, no peripheral edema.   Gastrointestinal system: Abdomen soft nontender. Central nervous system: A&O x3. no gross focal neurologic deficits, normal speech Extremities: moves all, no edema, normal tone Psychiatry: normal mood, congruent affect, judgement and insight appear normal  Labs   Data Reviewed: I have personally reviewed following labs and imaging studies  CBC: Recent Labs  Lab 07/13/21 0340 07/14/21 1833 07/15/21 0630 07/15/21 1612 07/16/21 1125  WBC 8.9  --  8.8  --  7.0  HGB 9.1* 5.2* 6.0* 7.3* 6.4*  HCT 27.6* 15.7* 17.7* 21.2* 19.2*  MCV 97.5  --  94.1  --  96.0  PLT 132*  --  147*  --  762*   Basic Metabolic Panel: Recent Labs  Lab 07/12/21 0059 07/12/21 1633 07/13/21 0340 07/13/21 1642 07/14/21 0225 07/14/21 1517 07/15/21 0630 07/15/21 1612 07/16/21 1125 07/16/21 1217  NA 121*   < > 125*   < > 121* 124* 129* 128*  --  132*  K 4.3  --  4.4  --  5.1 5.0 4.6 4.6  --  3.6  CL 92*  --  95*  --  91* 94* 98 97*  --  100  CO2 25  --  25  --  24 25 27 26   --  27  GLUCOSE 136*  --  215*  --  270* 255* 197* 278*  --  112*  BUN 11  --  23*  --  47* 64* 39* 28*  --  13  CREATININE 0.96  --  0.91  --  1.10 1.16 0.85 0.83  --  0.71  CALCIUM 7.1*  --  7.6*  --  7.2* 7.4* 7.6* 7.7*  --  7.5*  MG  --   --   --   --   --   --   --   --  2.3  --   PHOS 2.7  --  3.2   --  3.3  --  3.9  --   --  4.3   < > = values in this interval not displayed.   GFR: Estimated Creatinine Clearance: 122 mL/min (by C-G formula based on SCr of 0.71 mg/dL). Liver Function Tests: Recent Labs  Lab 07/12/21 0059 07/13/21 0340 07/14/21 0225 07/15/21 0630 07/16/21 1217  ALBUMIN <1.5* <1.5* <1.5* <1.5* 1.6*   No results for input(s): LIPASE, AMYLASE in the last 168 hours. No results for input(s): AMMONIA in the last 168 hours. Coagulation Profile: No results for input(s): INR, PROTIME in the last 168 hours. Cardiac Enzymes: No results for input(s): CKTOTAL, CKMB, CKMBINDEX, TROPONINI in the last 168 hours. BNP (last 3 results) No results for input(s): PROBNP in the last 8760 hours. HbA1C: No results for input(s): HGBA1C in the last 72 hours. CBG: Recent Labs  Lab 07/15/21 1122 07/15/21 1631 07/15/21 2115 07/16/21 0633 07/16/21 1024  GLUCAP 139* 252* 139* 142* 114*   Lipid Profile: No results for input(s): CHOL, HDL, LDLCALC, TRIG, CHOLHDL, LDLDIRECT in the last 72 hours. Thyroid Function Tests: No results for input(s): TSH, T4TOTAL, FREET4, T3FREE, THYROIDAB in the last 72 hours. Anemia Panel: No results for input(s): VITAMINB12, FOLATE, FERRITIN, TIBC, IRON, RETICCTPCT in the last 72 hours. Sepsis Labs: No results for input(s): PROCALCITON, LATICACIDVEN in the last 168 hours.  No results found for this or any previous visit (from the past 240 hour(s)).    Imaging Studies   No results found.   Medications   Scheduled Meds:  sodium chloride   Intravenous Once   sodium chloride   Intravenous Once   Chlorhexidine Gluconate Cloth  6 each Topical Daily   folic acid  1 mg Oral Daily   insulin aspart  0-15 Units Subcutaneous TID WC   insulin aspart  0-5 Units Subcutaneous QHS   insulin aspart  4 Units Subcutaneous TID WC   insulin glargine-yfgn  25 Units Subcutaneous QHS   lactulose  30 g Oral BID   linagliptin  5 mg Oral  Daily   multivitamin  1  tablet Oral Daily   pantoprazole  40 mg Oral BID AC   sodium chloride flush  10-40 mL Intracatheter Q12H   thiamine  100 mg Oral Daily   Continuous Infusions:   ceFAZolin (ANCEF) IV 2 g (07/16/21 1351)       LOS: 8 days    Time spent: 25 minutes    Ezekiel Slocumb, DO [facility]  07/16/2021, 2:33 PM      If 7PM-7AM, please contact night-coverage. How to contact the [facility] Attending or Consulting provider [place] or covering provider during after hours [place], for this patient?    Check the care team in [facility] and look for a) attending/consulting [facility] provider listed and b) the [facility] team listed Log into [email or web address] and use [place]'s universal password to access. If you do not have the password, please contact the hospital operator. Locate the [facility] provider you are looking for under [facility] and page to a number that you can be directly reached. If you still have difficulty reaching the provider, please page the [facility] (Director on Call) for the Hospitalists listed on amion for assistance.

## 2021-07-16 NOTE — Progress Notes (Signed)
[facility] Progress Note   Subjective:   Patient had EGD today with multiple ulcers in the upper GI tract.  States he feels fairly well.  Hemoglobin low this morning.  Sodium continues to improve at 132  Objective Vitals:   07/16/21 0800 07/16/21 1023 07/16/21 1037 07/16/21 1100  BP: (!) 102/47 (!) 94/55 (!) 103/51 (!) 102/53  Pulse: 76 85 84 87  Resp: 18 17 16 18   Temp: 98.2 F (36.8 C) 97.6 F (36.4 C) 97.7 F (36.5 C) 97.7 F (36.5 C)  TempSrc: Temporal   Oral  SpO2: 99% 100% 100% 100%  Weight: 104.8 kg     Height: 5\' 10"  (1.778 m)      Physical Exam Gen: Tired, lying in bed, no distress Eyes: anicteric, EOMI ENT: MMM Neck: supple, no JVD CV: Normal rate, no rub Abd: soft, obese Lungs: Bilateral chest rise with no increased work of breathing GU: no foley Extr:  1+ pedal  edema Neuro: nonfocal  Additional Objective Labs: Basic Metabolic Panel: Recent Labs  Lab 07/14/21 0225 07/14/21 1517 07/15/21 0630 07/15/21 1612 07/16/21 1217  NA 121*   < > 129* 128* 132*  K 5.1   < > 4.6 4.6 3.6  CL 91*   < > 98 97* 100  CO2 24   < > 27 26 27   GLUCOSE 270*   < > 197* 278* 112*  BUN 47*   < > 39* 28* 13  CREATININE 1.10   < > 0.85 0.83 0.71  CALCIUM 7.2*   < > 7.6* 7.7* 7.5*  PHOS 3.3  --  3.9  --  4.3   < > = values in this interval not displayed.   Liver Function Tests: Recent Labs  Lab 07/14/21 0225 07/15/21 0630 07/16/21 1217  ALBUMIN <1.5* <1.5* 1.6*   No results for input(s): LIPASE, AMYLASE in the last 168 hours. CBC: Recent Labs  Lab 07/13/21 0340 07/14/21 1833 07/15/21 0630 07/15/21 1612 07/16/21 1125  WBC 8.9  --  8.8  --  7.0  HGB 9.1*   < > 6.0* 7.3* 6.4*  HCT 27.6*   < > 17.7* 21.2* 19.2*  MCV 97.5  --  94.1  --  96.0  PLT 132*  --  147*  --  140*   < > = values in this interval not displayed.   Blood Culture    Component Value Date/Time   SDES BLOOD RIGHT WRIST 07/05/2021 0747   SDES BLOOD BLOOD RIGHT HAND  07/05/2021 0747   SPECREQUEST  07/05/2021 0747    BOTTLES DRAWN AEROBIC AND ANAEROBIC Blood Culture adequate volume   SPECREQUEST  07/05/2021 0747    BOTTLES DRAWN AEROBIC ONLY Blood Culture adequate volume   CULT  07/05/2021 0747    NO GROWTH 5 DAYS Performed at [facility], [address] [place], [place] [address]    CULT  07/05/2021 0747    NO GROWTH 5 DAYS Performed at [facility], [address] [place], [place] [address]    REPTSTATUS 07/10/2021 FINAL 07/05/2021 0747   REPTSTATUS 07/10/2021 FINAL 07/05/2021 0747    Cardiac Enzymes: No results for input(s): CKTOTAL, CKMB, CKMBINDEX, TROPONINI in the last 168 hours. CBG: Recent Labs  Lab 07/15/21 1122 07/15/21 1631 07/15/21 2115 07/16/21 0633 07/16/21 1024  GLUCAP 139* 252* 139* 142* 114*   Iron Studies: No results for input(s): IRON, TIBC, TRANSFERRIN, FERRITIN in the last 72 hours. @lablastinr3 @ Studies/Results: MR Shoulder Left W Wo Contrast  Result Date: 07/14/2021 CLINICAL DATA:  Left shoulder pain EXAM: MRI OF THE LEFT SHOULDER WITHOUT AND WITH CONTRAST TECHNIQUE: Multiplanar, multisequence MR imaging of the left shoulder was performed before and after the administration of intravenous contrast. CONTRAST:  61mL GADAVIST GADOBUTROL 1 MMOL/ML IV SOLN COMPARISON:  Left shoulder radiograph 07/12/2021 FINDINGS: Rotator cuff: There is a full-thickness, near full width tear of the supraspinatus tendon at the footprint (coronal T2 image 12). There is high-grade articular sided tearing of the anterior fibers of the infraspinatus tendon at the footprint (sagittal T2 image 4-6). Mild tendinosis of the cephalad fibers of the subscapularis tendon. The teres minor is intact. Muscles: Intramuscular edema within the infraspinatus and deltoid muscles. No significant muscle atrophy. Biceps Long Head: Intact intra and extra-articular long head biceps tendon. Acromioclavicular Joint: Moderate-severe arthropathy of the  acromioclavicular joint. There is distension of the subacromial-subdeltoid bursa with thick peripheral enhancement, short axis measuring up to 0.9 cm. (Coronal T1 post-contrast image 12, sagittal post image 5). Glenohumeral Joint: Trace glenohumeral joint fluid. Mild chondrosis. Labrum: Degenerative superior labral tearing extending anteriorly and posteriorly through the biceps labral anchor. Bones: No fracture or dislocation. No aggressive osseous lesion. No convincing marrow signal changes to suggest osteomyelitis. There is low T1 signal within the scapula, acromion, clavicle, nonspecific. Other: No fluid collection or hematoma. IMPRESSION: Full-thickness, near full width tear of the supraspinatus tendon at the footprint. High-grade articular sided tearing of the anterior fibers of the infraspinatus tendon at the footprint. Mild subscapularis tendinosis of the cephalad fibers. No significant muscle atrophy. Distension of the subacromial-subdeltoid bursa with a peripheral enhancement, short axis measuring up to 0.9 cm. While this could be related to the patient's rotator cuff tear, given adjacent reactive edema in the deltoid and clinical concern for infection, bursa aspiration should be considered. Minimal there is minimal glenohumeral joint fluid and without adjacent marrow signal abnormality to suggest osteomyelitis. Low T1 signal within the scapula, acromion and clavicle is nonspecific, possibly related to the patient's anemia. Degenerative superior labral tearing anteriorly and posteriorly through the biceps labral anchor. Moderate-severe AC joint arthropathy. Electronically Signed   By: Maurine Simmering M.D.   On: 07/14/2021 16:59   Medications:   ceFAZolin (ANCEF) IV 2 g (07/16/21 0647)    sodium chloride   Intravenous Once   sodium chloride   Intravenous Once   Chlorhexidine Gluconate Cloth  6 each Topical Daily   folic acid  1 mg Oral Daily   insulin aspart  0-15 Units Subcutaneous TID WC   insulin  aspart  0-5 Units Subcutaneous QHS   insulin aspart  4 Units Subcutaneous TID WC   insulin glargine-yfgn  25 Units Subcutaneous QHS   lactulose  30 g Oral BID   linagliptin  5 mg Oral Daily   multivitamin  1 tablet Oral Daily   pantoprazole  40 mg Oral BID AC   sodium chloride flush  10-40 mL Intracatheter Q12H   thiamine  100 mg Oral Daily    Assessment/Plan **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  ID following.  Panorex ok.  At [facility] for CTS consult -- no surgery, on antibiotics   **Hyponatremia: Thought to be related to alcohol use with low solute intake but has failed to improve with IV hydration.  TSH not low, no signs of adrenal insufficiency, no paraproteinemia. Repeat urine studies on 10/31 with fairly good urine osmolality 179 and urine sodium of 26.  Not high enough to suggest overt SIADH but could represent  some degree of heart failure playing a role.  Overall sodium has improved on its own in the last 2 days.  Stable at 132 today.  Hold further therapy.  Could consider reinstituting diuretics given his peripheral edema if sodium worsens again   **Anemia/GI bleeding: Hemoglobin continues to be low requiring transfusions.  Status post EGD today with ulcers.  Management per primary team   **EtOH overuse: thiamine/folate.     **DM: new dx, A1c was 7.8. Per primary.    **thrombocytopenia: Platelets have improved  Likely related to alcohol use  **Hypoalbuminemia: severely low <1.5. Nutrition playing a role but fairly profound.  Liver ultrasound is suggestive of some Liver disease.  Given the patient's improving sodium we will sign off at this time.

## 2021-07-16 NOTE — Progress Notes (Signed)
Physical Therapy Treatment Patient Details Name: Joshua Cole MRN: [id number] DOB: September 18, 1960 Today's Date: 07/16/2021   History of Present Illness Pt is a 60 y.o. male admitted 07/03/21 with c/o weakness, joint pain, confusion, nausea/vomiting, fever; of note, pt recently received flu shot and COVID booster on 10/14. Workup for hyponatremia, acute metabolic encephalopathy of unclear etiology. Brain MRI negative for acute injury. Blood cultures (+) staph aureus. Pelvic MRI 10/24 showed bilateral hip joint effusions; edema consistent with myositis. 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 Course complicated by development of heart block. Transfer to [facility] on 10/27 for cardiothoracic sx consult. Chest CTA negative for PE, concern for multiple ill-defined lesions. PMH includes gout, ETOH use.    PT Comments    Pt is making excellent progress with mobility and able to amb most of unit with walker. No signs of dizziness or orthostasis.    Recommendations for follow up therapy are one component of a multi-disciplinary discharge planning process, led by the attending physician.  Recommendations may be updated based on patient status, additional functional criteria and insurance authorization.  Follow Up Recommendations  Home health PT     Assistance Recommended at Discharge Intermittent Supervision/Assistance  Equipment Recommendations   (daughter has gotten a walker)    Recommendations for Other Services       Precautions / Restrictions Precautions Precautions: Fall     Mobility  Bed Mobility Overal bed mobility: Needs Assistance Bed Mobility: Supine to Sit     Supine to sit: Supervision;HOB elevated     General bed mobility comments: supervision for lines    Transfers Overall transfer level: Needs assistance Equipment used: Rolling walker (2 wheels) Transfers: Sit to/from Stand Sit to Stand: Min guard;From elevated surface           General transfer  comment: Assist for safety and verbal cues for hand placement    Ambulation/Gait Ambulation/Gait assistance: Supervision Gait Distance (Feet): 375 Feet Assistive device: Rolling walker (2 wheels) Gait Pattern/deviations: Step-through pattern;Decreased stride length Gait velocity: decr Gait velocity interpretation: <1.31 ft/sec, indicative of household ambulator General Gait Details: Supervision for Therapist, music    Modified Rankin (Stroke Patients Only)       Balance Overall balance assessment: Needs assistance Sitting-balance support: Feet supported Sitting balance-Leahy Scale: Good     Standing balance support: Bilateral upper extremity supported Standing balance-Leahy Scale: Poor Standing balance comment: walker and supervision for static standing                            Cognition Arousal/Alertness: Awake/alert Behavior During Therapy: WFL for tasks assessed/performed Overall Cognitive Status: Within Functional Limits for tasks assessed                                          Exercises      General Comments General comments (skin integrity, edema, etc.): No signs or symptons of dizziness/orthostasis      Pertinent Vitals/Pain Pain Assessment: Faces Faces Pain Scale: Hurts a little bit Pain Location: hips Pain Descriptors / Indicators: Aching Pain Intervention(s): Monitored during session    Home Living  Prior Function            PT Goals (current goals can now be found in the care plan section) Acute Rehab PT Goals Patient Stated Goal: go home Progress towards PT goals: Progressing toward goals    Frequency    Min 3X/week      PT Plan Current plan remains appropriate    Co-evaluation              AM-PAC PT "6 Clicks" Mobility   Outcome Measure  Help needed turning from your back to your side while in a flat bed  without using bedrails?: None Help needed moving from lying on your back to sitting on the side of a flat bed without using bedrails?: A Little Help needed moving to and from a bed to a chair (including a wheelchair)?: A Little Help needed standing up from a chair using your arms (e.g., wheelchair or bedside chair)?: A Little Help needed to walk in hospital room?: A Little Help needed climbing 3-5 steps with a railing? : A Little 6 Click Score: 19    End of Session Equipment Utilized During Treatment: Gait belt Activity Tolerance: Patient tolerated treatment well Patient left: in chair;with call bell/phone within reach;with family/visitor present   PT Visit Diagnosis: Unsteadiness on feet (R26.81);Other abnormalities of gait and mobility (R26.89);Pain Pain - part of body: Hip     Time: 1640-1701 PT Time Calculation (min) (ACUTE ONLY): 21 min  Charges:  $Gait Training: 8-22 mins                     [place] Pager [phone] Office [place] 07/16/2021, 5:35 PM

## 2021-07-16 NOTE — Progress Notes (Signed)
Progress Note  Patient Name: Joshua Cole Date of Encounter: 07/16/2021  [facility] Cardiologist: Kathlyn Sacramento, MD   Subjective   BP 102/53.  Received 2 units PRBCs, hemoglobin 7.3 today.  EGD/colonoscopy today with esophageal and duodenal ulcers.  Denies any chest pain or dyspnea or lightheadedness  Inpatient Medications    Scheduled Meds:  sodium chloride   Intravenous Once   Chlorhexidine Gluconate Cloth  6 each Topical Daily   folic acid  1 mg Oral Daily   insulin aspart  0-15 Units Subcutaneous TID WC   insulin aspart  0-5 Units Subcutaneous QHS   insulin aspart  4 Units Subcutaneous TID WC   insulin glargine-yfgn  25 Units Subcutaneous QHS   lactulose  30 g Oral BID   linagliptin  5 mg Oral Daily   multivitamin  1 tablet Oral Daily   pantoprazole  40 mg Oral BID AC   sodium chloride flush  10-40 mL Intracatheter Q12H   thiamine  100 mg Oral Daily   Continuous Infusions:   ceFAZolin (ANCEF) IV 2 g (07/16/21 0647)   PRN Meds: acetaminophen, alum & mag hydroxide-simeth, LORazepam, magnesium hydroxide, morphine injection, nitroGLYCERIN, ondansetron (ZOFRAN) IV, oxyCODONE, sodium chloride flush   Vital Signs    Vitals:   07/16/21 0800 07/16/21 1023 07/16/21 1037 07/16/21 1100  BP: (!) 102/47 (!) 94/55 (!) 103/51 (!) 102/53  Pulse: 76 85 84 87  Resp: 18 17 16 18   Temp: 98.2 F (36.8 C) 97.6 F (36.4 C) 97.7 F (36.5 C) 97.7 F (36.5 C)  TempSrc: Temporal   Oral  SpO2: 99% 100% 100% 100%  Weight: 104.8 kg     Height: 5\' 10"  (1.778 m)       Intake/Output Summary (Last 24 hours) at 07/16/2021 1118 Last data filed at 07/16/2021 1114 Gross per 24 hour  Intake 700 ml  Output 1451 ml  Net -751 ml    Last 3 Weights 07/16/2021 07/16/2021 07/15/2021  Weight (lbs) 231 lb 0.7 oz 231 lb 0.7 oz 231 lb 0.7 oz  Weight (kg) 104.8 kg 104.8 kg 104.8 kg      Telemetry    Currently Sinus rhythm with first-degree AV block, rate 80s.  Intermittent AV dissociation -  Personally Reviewed  ECG    No new EKG- Personally Reviewed  Physical Exam   GEN: No acute distress.   Neck: No JVD Cardiac: RRR, no murmurs, rubs, or gallops.  Respiratory: Clear to auscultation bilaterally. GI: Soft, nontender, non-distended  MS: No edema; No deformity. Neuro:  Nonfocal  Psych: Normal affect   Labs    High Sensitivity Troponin:  No results for input(s): TROPONINIHS in the last 720 hours.   Chemistry Recent Labs  Lab 07/13/21 0340 07/13/21 1642 07/14/21 0225 07/14/21 1517 07/15/21 0630 07/15/21 1612  NA 125*   < > 121* 124* 129* 128*  K 4.4  --  5.1 5.0 4.6 4.6  CL 95*  --  91* 94* 98 97*  CO2 25  --  24 25 27 26   GLUCOSE 215*  --  270* 255* 197* 278*  BUN 23*  --  47* 64* 39* 28*  CREATININE 0.91  --  1.10 1.16 0.85 0.83  CALCIUM 7.6*  --  7.2* 7.4* 7.6* 7.7*  ALBUMIN <1.5*  --  <1.5*  --  <1.5*  --   GFRNONAA >60  --  >60 >60 >60 >60  ANIONGAP 5  --  6 5 4* 5   < > =  values in this interval not displayed.     Lipids  No results for input(s): CHOL, TRIG, HDL, LABVLDL, LDLCALC, CHOLHDL in the last 168 hours.   Hematology Recent Labs  Lab 07/13/21 0340 07/14/21 1833 07/15/21 0630 07/15/21 1612  WBC 8.9  --  8.8  --   RBC 2.83*  --  1.88*  --   HGB 9.1* 5.2* 6.0* 7.3*  HCT 27.6* 15.7* 17.7* 21.2*  MCV 97.5  --  94.1  --   MCH 32.2  --  31.9  --   MCHC 33.0  --  33.9  --   RDW 12.5  --  14.0  --   PLT 132*  --  147*  --     Thyroid  No results for input(s): TSH, FREET4 in the last 168 hours.   BNP No results for input(s): BNP, PROBNP in the last 168 hours.   DDimer  No results for input(s): DDIMER in the last 168 hours.    Radiology    MR Shoulder Left W Wo Contrast  Result Date: 07/14/2021 CLINICAL DATA:  Left shoulder pain EXAM: MRI OF THE LEFT SHOULDER WITHOUT AND WITH CONTRAST TECHNIQUE: Multiplanar, multisequence MR imaging of the left shoulder was performed before and after the administration of intravenous contrast.  CONTRAST:  19mL GADAVIST GADOBUTROL 1 MMOL/ML IV SOLN COMPARISON:  Left shoulder radiograph 07/12/2021 FINDINGS: Rotator cuff: There is a full-thickness, near full width tear of the supraspinatus tendon at the footprint (coronal T2 image 12). There is high-grade articular sided tearing of the anterior fibers of the infraspinatus tendon at the footprint (sagittal T2 image 4-6). Mild tendinosis of the cephalad fibers of the subscapularis tendon. The teres minor is intact. Muscles: Intramuscular edema within the infraspinatus and deltoid muscles. No significant muscle atrophy. Biceps Long Head: Intact intra and extra-articular long head biceps tendon. Acromioclavicular Joint: Moderate-severe arthropathy of the acromioclavicular joint. There is distension of the subacromial-subdeltoid bursa with thick peripheral enhancement, short axis measuring up to 0.9 cm. (Coronal T1 post-contrast image 12, sagittal post image 5). Glenohumeral Joint: Trace glenohumeral joint fluid. Mild chondrosis. Labrum: Degenerative superior labral tearing extending anteriorly and posteriorly through the biceps labral anchor. Bones: No fracture or dislocation. No aggressive osseous lesion. No convincing marrow signal changes to suggest osteomyelitis. There is low T1 signal within the scapula, acromion, clavicle, nonspecific. Other: No fluid collection or hematoma. IMPRESSION: Full-thickness, near full width tear of the supraspinatus tendon at the footprint. High-grade articular sided tearing of the anterior fibers of the infraspinatus tendon at the footprint. Mild subscapularis tendinosis of the cephalad fibers. No significant muscle atrophy. Distension of the subacromial-subdeltoid bursa with a peripheral enhancement, short axis measuring up to 0.9 cm. While this could be related to the patient's rotator cuff tear, given adjacent reactive edema in the deltoid and clinical concern for infection, bursa aspiration should be considered. Minimal  there is minimal glenohumeral joint fluid and without adjacent marrow signal abnormality to suggest osteomyelitis. Low T1 signal within the scapula, acromion and clavicle is nonspecific, possibly related to the patient's anemia. Degenerative superior labral tearing anteriorly and posteriorly through the biceps labral anchor. Moderate-severe AC joint arthropathy. Electronically Signed   By: Maurine Simmering M.D.   On: 07/14/2021 16:59    Cardiac Studies   TEE 07/07/2021: 1. Left ventricular ejection fraction, by estimation, is 55 to 60%. The  left ventricle has normal function.   2. Right ventricular systolic function is normal. The right ventricular  size is normal.  3. No left atrial/left atrial appendage thrombus was detected.   4. The mitral valve is normal in structure. Mild mitral valve  regurgitation.   5. There is a mobile mass attached to the tricuspid valve (clip 132)  consistent with a vegetation given the current clinical context.. The  tricuspid valve is degenerative.   6. The aortic valve is tricuspid. Aortic valve regurgitation is not  visualized.   Conclusion(s)/Recommendation(s): Findings are concerning for  vegetation/infective endocarditis as detailed above.  __________   2D echo 07/04/2021:  1. Left ventricular ejection fraction, by estimation, is 55 to 60%. The  left ventricle has normal function. Left ventricular endocardial border  not optimally defined to evaluate regional wall motion. There is mild left  ventricular hypertrophy. Left  ventricular diastolic parameters are indeterminate.   2. Right ventricular systolic function is normal. The right ventricular  size is normal. Tricuspid regurgitation signal is inadequate for assessing  PA pressure.   3. Left atrial size was mildly dilated.   4. Right atrial size was mildly dilated.   5. The mitral valve is normal in structure. No evidence of mitral valve  regurgitation. No evidence of mitral stenosis.   6. The  aortic valve is normal in structure. Aortic valve regurgitation is  not visualized. Mild aortic valve sclerosis is present, with no evidence  of aortic valve stenosis.   7. Aortic dilatation noted. There is borderline dilatation of the aortic  root and of the ascending aorta, measuring 38 mm.   8. The tricuspid was not well visualized. However, there is likely a  mobile vergetation noted on the short axis images. Recommend a TEE.  Patient Profile     60 y.o. male  with gout, alcohol use, bilateral THA, and poor dental hygiene who was admitted to [facility] on 07/03/2021 with MSSA bacteremia and found to have a tricuspid valve vegetation complicated by complete heart block with good escape rhythm who is being seen 07/08/2021 for the evaluation of the above.  Assessment & Plan    MSSA bacteremia with tricuspid valve vegetation with acute metabolic encephalopathy: -- Continue IV Ancef.  PICC line placed -- Appreciate ID recs: Plan 6-week course of IV Ancef through 12/5 -- Transferred to [facility] for cardiothoracic surgery evaluation.  Seen by Dr. 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  -- Evaluated by EP with no current indication for venous temp wire at this time, given good escape rhythm -- EP recommended pacemaker at time of valve replacement.  D/w EP since not going for surgery, plan outpatient f/u in 6 weeks once treated for IE given his stable escape rhythm -- Avoiding AV nodal blocking agents    Anemia: hemoglobin dropped from 9.1>5.2 11/2.  FOBT positive.  Given 2 units PRBCs, hemoglobin improved to 7.3 11/4.  Underwent EGD/colonoscopy 11/4, esophageal and duodenal ulcers found.  GI recommends PPI twice daily  Respiratory failure: Stable, weaned to RA with stable sats. -- CTA chest negative for PE with concern for multiple ill-defined lesions   Hyponatremia: Improved to 128 today.  Suspected in  the setting of ETOH use -- Nephrology following, appreciate recs  Thrombocytopenia: Improving, as low as 57 but improved to 147 -- evaluated by oncology  -- Suspected to be in the setting of alcohol use   Alcohol use: -- Cessation advised    DM: Newly diagnosed  with A1c 7.8 --Evaluated by diabetic coordinator, appreciate recs, started on  insulin --Needs outpatient follow up with PCP   Arthralgias: -- Evaluated by orthopedics at [facility], did not think hip was infected -- Continue previously ordered PRN oxycodone and morphine   Left arm swelling: Duplex negative for DVT   Consults:  Oncology, ID, Ortho, EP, Nephrology, GI.  D/w Dr. Arbutus Ped, appreciate taking on [facility] service given complex medical issues  For questions or updates, please contact [place] Please consult [email or web address] for contact info under   Signed, Donato Heinz, MD  07/16/2021, 11:18 AM

## 2021-07-16 NOTE — Progress Notes (Signed)
PT Cancellation Note  Patient Details Name: Joshua Cole MRN: [id number] DOB: February 20, 1961   Cancelled Treatment:    Reason Eval/Treat Not Completed: Patient at procedure or test/unavailable   Shary Decamp [facility] 07/16/2021, 8:44 AM [place] Pager [phone] Office [phone]

## 2021-07-16 NOTE — Transfer of Care (Signed)
Immediate Anesthesia Transfer of Care Note  Patient: Joshua Cole  Procedure(s) Performed: ESOPHAGOGASTRODUODENOSCOPY (EGD) WITH PROPOFOL BIOPSY POLYPECTOMY COLONOSCOPY WITH PROPOFOL  Patient Location: PACU  Anesthesia Type:MAC  Level of Consciousness: awake, alert  and oriented  Airway & Oxygen Therapy: Patient Spontanous Breathing  Post-op Assessment: Report given to RN and Post -op Vital signs reviewed and stable  Post vital signs: Reviewed and stable  Last Vitals:  Vitals Value Taken Time  BP 94/55 07/16/21 1023  Temp 36.4 C 07/16/21 1023  Pulse 85 07/16/21 1024  Resp 25 07/16/21 1024  SpO2 100 % 07/16/21 1024  Vitals shown include unvalidated device data.  Last Pain:  Vitals:   07/16/21 0800  TempSrc: Temporal  PainSc: 0-No pain      Patients Stated Pain Goal: 0 (85/46/27 0350)  Complications: No notable events documented.

## 2021-07-16 NOTE — Anesthesia Procedure Notes (Signed)
Procedure Name: MAC Date/Time: 07/16/2021 9:25 AM Performed by: Trinna Post., CRNA Pre-anesthesia Checklist: Patient identified, Emergency Drugs available, Suction available, Patient being monitored and Timeout performed Patient Re-evaluated:Patient Re-evaluated prior to induction Oxygen Delivery Method: Nasal cannula Preoxygenation: Pre-oxygenation with 100% oxygen Induction Type: IV induction Placement Confirmation: positive ETCO2

## 2021-07-16 NOTE — Interval H&P Note (Signed)
History and Physical Interval Note:  07/16/2021 9:00 AM  Joshua Cole  has presented today for surgery, with the diagnosis of Gradual decline of Hgb, transfusion requiring anemia.  FOBT positive stool..  The various methods of treatment have been discussed with the patient and family. After consideration of risks, benefits and other options for treatment, the patient has consented to  Procedure(s): ESOPHAGOGASTRODUODENOSCOPY (EGD) WITH PROPOFOL (N/A) COLONOSCOPY WITH PROPOFOL (N/A) as a surgical intervention.  The patient's history has been reviewed, patient examined, no change in status, stable for surgery.  I have reviewed the patient's chart and labs.  Questions were answered to the patient's satisfaction.     Sharyn Creamer

## 2021-07-17 DIAGNOSIS — K269 Duodenal ulcer, unspecified as acute or chronic, without hemorrhage or perforation: Secondary | ICD-10-CM

## 2021-07-17 LAB — BASIC METABOLIC PANEL
Anion gap: 6 (ref 5–15)
Anion gap: 6 (ref 5–15)
BUN: 10 mg/dL (ref 6–20)
BUN: 9 mg/dL (ref 6–20)
CO2: 26 mmol/L (ref 22–32)
CO2: 24 mmol/L (ref 22–32)
Calcium: 7.8 mg/dL — ABNORMAL LOW (ref 8.9–10.3)
Calcium: 7.6 mg/dL — ABNORMAL LOW (ref 8.9–10.3)
Chloride: 99 mmol/L (ref 98–111)
Chloride: 98 mmol/L (ref 98–111)
Creatinine, Ser: 0.89 mg/dL (ref 0.61–1.24)
Creatinine, Ser: 0.82 mg/dL (ref 0.61–1.24)
GFR, Estimated: >60 mL/min (ref >60)
GFR, Estimated: >60 mL/min (ref >60)
Glucose, Bld: 108 mg/dL — ABNORMAL HIGH (ref 70–99)
Glucose, Bld: 170 mg/dL — ABNORMAL HIGH (ref 70–99)
Potassium: 4 mmol/L (ref 3.5–5.1)
Potassium: 4 mmol/L (ref 3.5–5.1)
Sodium: 131 mmol/L — ABNORMAL LOW (ref 135–145)
Sodium: 128 mmol/L — ABNORMAL LOW (ref 135–145)

## 2021-07-17 LAB — BPAM RBC
Blood Product Expiration Date: 202211292359
Blood Product Expiration Date: 202211292359
Blood Product Expiration Date: 202212012359
Blood Product Expiration Date: 202212012359
ISSUE DATE / TIME: 202211022237
ISSUE DATE / TIME: 202211031030
ISSUE DATE / TIME: 202211041451
ISSUE DATE / TIME: 202211041818
Unit Type and Rh: 5100
Unit Type and Rh: 5100
Unit Type and Rh: 5100
Unit Type and Rh: 5100

## 2021-07-17 LAB — TYPE AND SCREEN
ABO/RH(D): O POS
Antibody Screen: NEGATIVE
Unit division: 0
Unit division: 0
Unit division: 0
Unit division: 0

## 2021-07-17 LAB — CBC
HCT: 25.7 % — ABNORMAL LOW (ref 39.0–52.0)
Hemoglobin: 8.6 g/dL — ABNORMAL LOW (ref 13.0–17.0)
MCH: 31 pg (ref 26.0–34.0)
MCHC: 33.5 g/dL (ref 30.0–36.0)
MCV: 92.8 fL (ref 80.0–100.0)
Platelets: 149 10*3/uL — ABNORMAL LOW (ref 150–400)
RBC: 2.77 MIL/uL — ABNORMAL LOW (ref 4.22–5.81)
RDW: 16.1 % — ABNORMAL HIGH (ref 11.5–15.5)
WBC: 10.6 10*3/uL — ABNORMAL HIGH (ref 4.0–10.5)
nRBC: 0.2 % (ref 0.0–0.2)

## 2021-07-17 LAB — HEMOGLOBIN AND HEMATOCRIT, BLOOD
HCT: 25.2 % — ABNORMAL LOW (ref 39.0–52.0)
HCT: 24.4 % — ABNORMAL LOW (ref 39.0–52.0)
Hemoglobin: 8.4 g/dL — ABNORMAL LOW (ref 13.0–17.0)
Hemoglobin: 8.2 g/dL — ABNORMAL LOW (ref 13.0–17.0)

## 2021-07-17 LAB — GLUCOSE, CAPILLARY
Glucose-Capillary: 107 mg/dL — ABNORMAL HIGH (ref 70–99)
Glucose-Capillary: 144 mg/dL — ABNORMAL HIGH (ref 70–99)
Glucose-Capillary: 170 mg/dL — ABNORMAL HIGH (ref 70–99)
Glucose-Capillary: 163 mg/dL — ABNORMAL HIGH (ref 70–99)

## 2021-07-17 NOTE — Progress Notes (Signed)
Progress Note  Patient Name: Joshua Cole Date of Encounter: 07/17/2021  Primary Cardiologist:   Kathlyn Sacramento, MD   Subjective   Feels OK. No chest pain.  No SOB.   Inpatient Medications    Scheduled Meds:  sodium chloride   Intravenous Once   sodium chloride   Intravenous Once   Chlorhexidine Gluconate Cloth  6 each Topical Daily   folic acid  1 mg Oral Daily   insulin aspart  0-15 Units Subcutaneous TID WC   insulin aspart  0-5 Units Subcutaneous QHS   insulin aspart  4 Units Subcutaneous TID WC   insulin glargine-yfgn  25 Units Subcutaneous QHS   linagliptin  5 mg Oral Daily   multivitamin  1 tablet Oral Daily   pantoprazole  40 mg Oral BID AC   sodium chloride flush  10-40 mL Intracatheter Q12H   thiamine  100 mg Oral Daily   Continuous Infusions:   ceFAZolin (ANCEF) IV 2 g (07/17/21 0644)   PRN Meds: acetaminophen, alum & mag hydroxide-simeth, LORazepam, magnesium hydroxide, morphine injection, nitroGLYCERIN, ondansetron (ZOFRAN) IV, oxyCODONE, sodium chloride flush   Vital Signs    Vitals:   07/16/21 2201 07/16/21 2327 07/17/21 0609 07/17/21 0825  BP: (!) 114/55 (!) 138/57  (!) 145/51  Pulse: 70 73  79  Resp: 18 20  16   Temp: 98.9 F (37.2 C) 98.7 F (37.1 C)  98.4 F (36.9 C)  TempSrc: Oral Oral  Oral  SpO2: 98% 97%  98%  Weight:   104.5 kg   Height:        Intake/Output Summary (Last 24 hours) at 07/17/2021 1007 Last data filed at 07/17/2021 0826 Gross per 24 hour  Intake 1683 ml  Output 1451 ml  Net 232 ml   Filed Weights   07/16/21 0512 07/16/21 0800 07/17/21 0609  Weight: 104.8 kg 104.8 kg 104.5 kg    Telemetry    NSR with complete HB but narrow escape rhythm  - Personally Reviewed  ECG    NA - Personally Reviewed  Physical Exam   GEN: No acute distress.   Neck: No  JVD Cardiac: RRR, no murmurs, rubs, or gallops.  Respiratory: Clear  to auscultation bilaterally. GI: Soft, nontender, non-distended  MS: No  edema; No  deformity. Neuro:  Nonfocal  Psych: Normal affect   Labs    Chemistry Recent Labs  Lab 07/14/21 0225 07/14/21 1517 07/15/21 0630 07/15/21 1612 07/16/21 1217 07/17/21 0238  NA 121*   < > 129* 128* 132* 131*  K 5.1   < > 4.6 4.6 3.6 4.0  CL 91*   < > 98 97* 100 99  CO2 24   < > 27 26 27 26   GLUCOSE 270*   < > 197* 278* 112* 108*  BUN 47*   < > 39* 28* 13 10  CREATININE 1.10   < > 0.85 0.83 0.71 0.89  CALCIUM 7.2*   < > 7.6* 7.7* 7.5* 7.8*  ALBUMIN <1.5*  --  <1.5*  --  1.6*  --   GFRNONAA >60   < > >60 >60 >60 >60  ANIONGAP 6   < > 4* 5 5 6    < > = values in this interval not displayed.     Hematology Recent Labs  Lab 07/15/21 0630 07/15/21 1612 07/16/21 1125 07/17/21 0238 07/17/21 0239  WBC 8.8  --  7.0  --  10.6*  RBC 1.88*  --  2.00*  --  2.77*  HGB 6.0*   < > 6.4* 8.4* 8.6*  HCT 17.7*   < > 19.2* 25.2* 25.7*  MCV 94.1  --  96.0  --  92.8  MCH 31.9  --  32.0  --  31.0  MCHC 33.9  --  33.3  --  33.5  RDW 14.0  --  14.6  --  16.1*  PLT 147*  --  140*  --  149*   < > = values in this interval not displayed.    Cardiac EnzymesNo results for input(s): TROPONINI in the last 168 hours. No results for input(s): TROPIPOC in the last 168 hours.   BNPNo results for input(s): BNP, PROBNP in the last 168 hours.   DDimer No results for input(s): DDIMER in the last 168 hours.   Radiology    No results found.  Cardiac Studies   TEE  1. Left ventricular ejection fraction, by estimation, is 55 to 60%. The  left ventricle has normal function.   2. Right ventricular systolic function is normal. The right ventricular  size is normal.   3. No left atrial/left atrial appendage thrombus was detected.   4. The mitral valve is normal in structure. Mild mitral valve  regurgitation.   5. There is a mobile mass attached to the tricuspid valve (clip 132)  consistent with a vegetation given the current clinical context.. The  tricuspid valve is degenerative.   6. The aortic  valve is tricuspid. Aortic valve regurgitation is not  visualized.   Patient Profile     60 y.o. male with gout, alcohol use, bilateral THA, and poor dental hygiene who was admitted to [facility] on 07/03/2021 with MSSA bacteremia and found to have a tricuspid valve vegetation complicated by complete heart block with good escape rhythm who is being seen 07/08/2021 for the evaluation of the above.  Assessment & Plan    MSSA bacteremia with tricuspid valve vegetation with acute metabolic encephalopathy:  Plan six weeks of IV Ancef.  Medical management.    Complete heart block: In the setting of MSSA bacteremia with tricuspid valve vegetation.   Expectant followup deferring pacing with ongoing infection.    Anemia:   Ulcers.  On PPI bid and transfused.  Hgb is holding at 8.6 today.   Hyponatremia:   Na is improved from nadir.    Thrombocytopenia: Improved  Alcohol use:  Likely contributor to several issues above.     DM: Newly diagnosed  with A1c 7.8.  New diagnosis per primary team.   Left arm swelling: Duplex negative for DVT  For questions or updates, please contact [place] Please consult [email or web address] for contact info under Cardiology/STEMI.   Signed, Minus Breeding, MD  07/17/2021, 10:07 AM

## 2021-07-17 NOTE — Progress Notes (Signed)
PROGRESS NOTE    Joshua Cole   AVW:[id number]  DOB: [birth year]  PCP: Pcp, No    DOA: 07/08/2021 LOS: 9    Brief Narrative / Hospital Course to Date:   Per Dr. Roosevelt Locks Consult Note: "60 year old male patient with past medical history of gout, alcohol use presented with generalized weakness, bilateral hip pain left shoulder pain, who was admitted to [facility].   Patient was intermittently confused in the ED, blood work showed MSSA bacteremia.  CT angiogram negative for PE, then TEE was positive for tricuspid valve vegetation.  During hospital stay at [facility], patient developed third-degree AV block on 10/26, and transferred to [facility] for further management.   Initial plan was for CT surgery evaluation for TR replacement, CT surgery, evaluated patient and recommended conservative management only, and no surgery.   Patient has been on Ancef since.   For the bilateral hip pain, MRI showed small to moderate right-sided joint effusion.  Patient has mild bilateral hip pain right>left.   Patient was also found to have significant elevation of blood glucose, A1c 7.8.  Patient was started on insulin sliding scale.   Blood work shows old patient has euvolemic hyponatremia likely from alcohol abuse.  Nephrology was on board and started patient on Lasix.  Patient has no history of cirrhosis, acute ultrasound showed fatty liver but no signs of cirrhosis.  Albumin level decreased to 2.7, INR within normal limits.  UA showed no protein."  Assessment & Plan   Active Problems:   Acute metabolic encephalopathy   Generalized weakness   Bacteremia   Acute bacterial endocarditis   Heart block   MSSA bacteremia   Acute blood loss anemia   Heart block AV complete (HCC)   Abnormal LFTs   Duodenal ulcer   History of colonic polyps   MSSA bacteremia with tricuspid valve endocarditis --Cardiology and infectious disease following --Continue Ancef --Repeat echo in 6 to 8 weeks  Acute blood loss anemia,  abdominal pain, history of alcohol abuse /esophageal and duodenal ulcers, gastritis -  --Continue PPI twice daily -- --Monitor hemoglobin and transfuse if less than 7 11/3: Received 2 unit PRBCs overnight with improvement of hemoglobin from 5.2-6.0>>7.3 11/4: Hemoglobin this morning 6.4 EGD and colonoscopy done this morning 11/4.  EGD showed nonbleeding esophageal ulcer and multiple duodenal ulcers in addition to gastritis.  Ulcers thought to be source of patient's blood loss although not actively bleeding at the time of evaluation. --Transfuse additional 2 units RBCs today --Check posttransfusion H&H  Complete heart block -PPM initially planned with possible tricuspid valve replacement.  Cardiology and EP are following.  Valve replacement not currently planned.  Monitor on telemetry.  Hyponatremia, euvolemic -nephrology consulted.  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 Improved and stable. --Fluid restriction 1500 cc/day  Hypoalbuminemia -UA without proteinuria, right upper quadrant ultrasound showed no signs of cirrhosis, but given history of alcohol abuse could be related to liver dysfunction.  Monitor.  Thrombocytopenia -most likely related to alcohol abuse, possibly liver disease.  Platelet count improving. Follow CBC.  Insulin-dependent type 2 diabetes -uncontrolled.  Started on sliding scale insulin and Tradjenta for underlying insulin resistance.  Alcohol abuse -with most recent drink more than 10 days ago, no signs or symptoms of active withdrawal.  Abdominal imaging showed hepatic steatosis, no signs of cirrhosis at this point.  INR within normal limits. Patient counseled on importance of alcohol cessation.  Right hip pain - MRI of pelvis showed moderate right hip joint effusion with right-sided periprostatic fluid  collection tracking posteriorly along the greater trochanter.  No marrow signal changes to suggest osteomyelitis.  ID following as above.  ID discussed aspirating joint with IR,  but it was felt to be low yield and more risk than benefit, unlikely to change management. --Reimage if worsening pain  Symmetric bilateral intramuscular edema involving the gluteal musculature and adductor muscles, consistent with non-specific myositis. No evidence of pyomyositis.     Obesity: Body mass index is 33.06 kg/m.  Complicates overall care and prognosis.  Recommend lifestyle modifications including physical activity and diet for weight loss and overall long-term health.   DVT prophylaxis: Place and maintain sequential compression device Start: 07/15/21 1045 Place TED hose Start: 07/14/21 1245   Diet:  Diet Orders (From admission, onward)     Start     Ordered   07/16/21 1025  Diet Heart Room service appropriate? Yes; Fluid consistency: Thin  Diet effective now       Question Answer Comment  Room service appropriate? Yes   Fluid consistency: Thin      07/16/21 1024              Code Status: Full Code   Subjective 07/17/21    Pt awake resting in bed.  Reports feeling overall well. Wasn't able to sleep last night, not sure why, but says every couple nights will be hard to sleep.  Feels better after blood transfusions.   Disposition Plan & Communication   Status is: Inpatient  Remains inpatient appropriate because: warrants close monitoring for anemia, having required blood transfusions yesterday.   Consults, Procedures, Significant Events   Consultants:  Cardiology Gastroenterology  Procedures:  EGD and colonoscopy 07/16/21  Antimicrobials:  Anti-infectives (From admission, onward)    Start     Dose/Rate Route Frequency Ordered Stop   07/16/21 0000  ceFAZolin (ANCEF) IVPB        2 g Intravenous Every 8 hours 07/16/21 1233 08/17/21 2359   07/08/21 2200  ceFAZolin (ANCEF) IVPB 2g/100 mL premix        2 g 200 mL/hr over 30 Minutes Intravenous Every 8 hours 07/08/21 1725           Micro    Objective   Vitals:   07/16/21 2327 07/17/21 0609  07/17/21 0825 07/17/21 1205  BP: (!) 138/57  (!) 145/51 (!) 96/57  Pulse: 73  79 76  Resp: 20  16 14   Temp: 98.7 F (37.1 C)  98.4 F (36.9 C) 98.1 F (36.7 C)  TempSrc: Oral  Oral Oral  SpO2: 97%  98% 100%  Weight:  104.5 kg    Height:        Intake/Output Summary (Last 24 hours) at 07/17/2021 1343 Last data filed at 07/17/2021 1206 Gross per 24 hour  Intake 983 ml  Output 1950 ml  Net -967 ml   Filed Weights   07/16/21 0512 07/16/21 0800 07/17/21 0609  Weight: 104.8 kg 104.8 kg 104.5 kg    Physical Exam:  General exam: awake, alert, no acute distress Respiratory system: CTAB, normal respiratory effort, on room air. Cardiovascular system: Regular rate and rhythm, no lower extremity edema.   Gastrointestinal system: Abdomen soft nontender. Central nervous system: A&O x3. no gross focal neurologic deficits, normal speech Extremities: moves all, no edema, normal tone Psychiatry: normal mood, congruent affect, judgement and insight appear normal  Labs   Data Reviewed: I have personally reviewed following labs and imaging studies  CBC: Recent Labs  Lab 07/13/21 0340 07/14/21 1833  07/15/21 0630 07/15/21 1612 07/16/21 1125 07/17/21 0238 07/17/21 0239  WBC 8.9  --  8.8  --  7.0  --  10.6*  HGB 9.1*   < > 6.0* 7.3* 6.4* 8.4* 8.6*  HCT 27.6*   < > 17.7* 21.2* 19.2* 25.2* 25.7*  MCV 97.5  --  94.1  --  96.0  --  92.8  PLT 132*  --  147*  --  140*  --  149*   < > = values in this interval not displayed.   Basic Metabolic Panel: Recent Labs  Lab 07/12/21 0059 07/12/21 1633 07/13/21 0340 07/13/21 1642 07/14/21 0225 07/14/21 1517 07/15/21 0630 07/15/21 1612 07/16/21 1125 07/16/21 1217 07/17/21 0238  NA 121*   < > 125*   < > 121* 124* 129* 128*  --  132* 131*  K 4.3  --  4.4  --  5.1 5.0 4.6 4.6  --  3.6 4.0  CL 92*  --  95*  --  91* 94* 98 97*  --  100 99  CO2 25  --  25  --  24 25 27 26   --  27 26  GLUCOSE 136*  --  215*  --  270* 255* 197* 278*  --  112*  108*  BUN 11  --  23*  --  47* 64* 39* 28*  --  13 10  CREATININE 0.96  --  0.91  --  1.10 1.16 0.85 0.83  --  0.71 0.89  CALCIUM 7.1*  --  7.6*  --  7.2* 7.4* 7.6* 7.7*  --  7.5* 7.8*  MG  --   --   --   --   --   --   --   --  2.3  --   --   PHOS 2.7  --  3.2  --  3.3  --  3.9  --   --  4.3  --    < > = values in this interval not displayed.   GFR: Estimated Creatinine Clearance: 109.5 mL/min (by C-G formula based on SCr of 0.89 mg/dL). Liver Function Tests: Recent Labs  Lab 07/12/21 0059 07/13/21 0340 07/14/21 0225 07/15/21 0630 07/16/21 1217  ALBUMIN <1.5* <1.5* <1.5* <1.5* 1.6*   No results for input(s): LIPASE, AMYLASE in the last 168 hours. No results for input(s): AMMONIA in the last 168 hours. Coagulation Profile: No results for input(s): INR, PROTIME in the last 168 hours. Cardiac Enzymes: No results for input(s): CKTOTAL, CKMB, CKMBINDEX, TROPONINI in the last 168 hours. BNP (last 3 results) No results for input(s): PROBNP in the last 8760 hours. HbA1C: No results for input(s): HGBA1C in the last 72 hours. CBG: Recent Labs  Lab 07/16/21 1024 07/16/21 1635 07/16/21 2114 07/17/21 0608 07/17/21 1211  GLUCAP 114* 258* 119* 107* 144*   Lipid Profile: No results for input(s): CHOL, HDL, LDLCALC, TRIG, CHOLHDL, LDLDIRECT in the last 72 hours. Thyroid Function Tests: No results for input(s): TSH, T4TOTAL, FREET4, T3FREE, THYROIDAB in the last 72 hours. Anemia Panel: No results for input(s): VITAMINB12, FOLATE, FERRITIN, TIBC, IRON, RETICCTPCT in the last 72 hours. Sepsis Labs: No results for input(s): PROCALCITON, LATICACIDVEN in the last 168 hours.  No results found for this or any previous visit (from the past 240 hour(s)).    Imaging Studies   No results found.   Medications   Scheduled Meds:  sodium chloride   Intravenous Once   sodium chloride   Intravenous Once   Chlorhexidine  Gluconate Cloth  6 each Topical Daily   folic acid  1 mg Oral Daily    insulin aspart  0-15 Units Subcutaneous TID WC   insulin aspart  0-5 Units Subcutaneous QHS   insulin aspart  4 Units Subcutaneous TID WC   insulin glargine-yfgn  25 Units Subcutaneous QHS   linagliptin  5 mg Oral Daily   multivitamin  1 tablet Oral Daily   pantoprazole  40 mg Oral BID AC   sodium chloride flush  10-40 mL Intracatheter Q12H   thiamine  100 mg Oral Daily   Continuous Infusions:   ceFAZolin (ANCEF) IV 2 g (07/17/21 1326)       LOS: 9 days    Time spent: 25 minutes    Ezekiel Slocumb, DO [facility]  07/17/2021, 1:43 PM      If 7PM-7AM, please contact night-coverage. How to contact the [facility] Attending or Consulting provider [place] or covering provider during after hours [place], for this patient?    Check the care team in [facility] and look for a) attending/consulting [facility] provider listed and b) the [facility] team listed Log into [email or web address] and use [place]'s universal password to access. If you do not have the password, please contact the hospital operator. Locate the [facility] provider you are looking for under [facility] and page to a number that you can be directly reached. If you still have difficulty reaching the provider, please page the [facility] (Director on Call) for the Hospitalists listed on amion for assistance.

## 2021-07-18 ENCOUNTER — Encounter (HOSPITAL_COMMUNITY): Payer: Self-pay | Admitting: Internal Medicine

## 2021-07-18 LAB — BASIC METABOLIC PANEL
Anion gap: 7 (ref 5–15)
BUN: 8 mg/dL (ref 6–20)
CO2: 26 mmol/L (ref 22–32)
Calcium: 7.3 mg/dL — ABNORMAL LOW (ref 8.9–10.3)
Chloride: 93 mmol/L — ABNORMAL LOW (ref 98–111)
Creatinine, Ser: 0.79 mg/dL (ref 0.61–1.24)
GFR, Estimated: >60 mL/min (ref >60)
Glucose, Bld: 256 mg/dL — ABNORMAL HIGH (ref 70–99)
Potassium: 3.8 mmol/L (ref 3.5–5.1)
Sodium: 126 mmol/L — ABNORMAL LOW (ref 135–145)

## 2021-07-18 LAB — GLUCOSE, CAPILLARY
Glucose-Capillary: 109 mg/dL — ABNORMAL HIGH (ref 70–99)
Glucose-Capillary: 170 mg/dL — ABNORMAL HIGH (ref 70–99)
Glucose-Capillary: 149 mg/dL — ABNORMAL HIGH (ref 70–99)
Glucose-Capillary: 189 mg/dL — ABNORMAL HIGH (ref 70–99)

## 2021-07-18 LAB — CBC
HCT: 26.6 % — ABNORMAL LOW (ref 39.0–52.0)
Hemoglobin: 8.5 g/dL — ABNORMAL LOW (ref 13.0–17.0)
MCH: 30.7 pg (ref 26.0–34.0)
MCHC: 32 g/dL (ref 30.0–36.0)
MCV: 96 fL (ref 80.0–100.0)
Platelets: 140 10*3/uL — ABNORMAL LOW (ref 150–400)
RBC: 2.77 MIL/uL — ABNORMAL LOW (ref 4.22–5.81)
RDW: 16.1 % — ABNORMAL HIGH (ref 11.5–15.5)
WBC: 8.9 10*3/uL (ref 4.0–10.5)
nRBC: 0 % (ref 0.0–0.2)

## 2021-07-18 MED ORDER — SENNOSIDES-DOCUSATE SODIUM 8.6-50 MG PO TABS
1.0000 | ORAL_TABLET | Freq: Every day | ORAL | Status: DC
  Filled 2021-07-18: qty 1

## 2021-07-18 MED ORDER — POLYETHYLENE GLYCOL 3350 17 G PO PACK
17.0000 g | PACK | Freq: Every day | ORAL | Status: DC
  Administered 2021-07-18: 17 g via ORAL
  Filled 2021-07-18 (×2): qty 1

## 2021-07-18 MED ORDER — FUROSEMIDE 10 MG/ML IJ SOLN
40.0000 mg | Freq: Once | INTRAMUSCULAR | Status: AC
  Administered 2021-07-18: 40 mg via INTRAVENOUS
  Filled 2021-07-18: qty 4

## 2021-07-18 NOTE — Progress Notes (Signed)
Mobility Specialist Progress Note:   07/18/21 1129  Mobility  Activity Ambulated in hall  Level of Assistance Standby assist, set-up cues, supervision of patient - no hands on  Assistive Device Front wheel walker  Distance Ambulated (ft) 280 ft  Mobility Ambulated with assistance in hallway  Mobility Response Tolerated well  Mobility performed by Mobility specialist  $Mobility charge 1 Mobility   Pt received in chair willing to participate in mobility. Asymptomatic and no complaints of pain. Pt returned to bed with call bell in reach and all needs met.   [facility] and safety inspector Phone [phone]

## 2021-07-18 NOTE — Progress Notes (Signed)
PROGRESS NOTE    Joshua Cole   SEG:[id number]  DOB: 26-Dec-1960  PCP: Pcp, No    DOA: 07/08/2021 LOS: 10    Brief Narrative / Hospital Course to Date:   Per Dr. Roosevelt Locks Consult Note: "60 year old male patient with past medical history of gout, alcohol use presented with generalized weakness, bilateral hip pain left shoulder pain, who was admitted to [facility].   Patient was intermittently confused in the ED, blood work showed MSSA bacteremia.  CT angiogram negative for PE, then TEE was positive for tricuspid valve vegetation.  During hospital stay at [facility], patient developed third-degree AV block on 10/26, and transferred to [facility] for further management.   Initial plan was for CT surgery evaluation for TR replacement, CT surgery, evaluated patient and recommended conservative management only, and no surgery.   Patient has been on Ancef since.   For the bilateral hip pain, MRI showed small to moderate right-sided joint effusion.  Patient has mild bilateral hip pain right>left.   Patient was also found to have significant elevation of blood glucose, A1c 7.8.  Patient was started on insulin sliding scale.   Blood work shows old patient has euvolemic hyponatremia likely from alcohol abuse.  Nephrology was on board and started patient on Lasix.  Patient has no history of cirrhosis, acute ultrasound showed fatty liver but no signs of cirrhosis.  Albumin level decreased to 2.7, INR within normal limits.  UA showed no protein."  Assessment & Plan   Active Problems:   Acute metabolic encephalopathy   Generalized weakness   Bacteremia   Acute bacterial endocarditis   Heart block   MSSA bacteremia   Acute blood loss anemia   Heart block AV complete (HCC)   Abnormal LFTs   Duodenal ulcer   History of colonic polyps   MSSA bacteremia with tricuspid valve endocarditis --Cardiology and infectious disease following --Continue Ancef --Repeat echo in 6 to 8 weeks  Acute blood loss anemia,  abdominal pain, history of alcohol abuse /esophageal and duodenal ulcers, gastritis -  --Continue PPI twice daily --Monitor hemoglobin and transfuse if less than 7 11/3: Received 2 unit PRBCs overnight with improvement of hemoglobin from 5.2-6.0>>7.3 11/4: Hemoglobin this morning 6.4, given 2 units pRBC's EGD and colonoscopy morning of 11/4.  EGD showed nonbleeding esophageal ulcer and multiple duodenal ulcers in addition to gastritis.  Ulcers thought to be source of patient's blood loss although not actively bleeding at the time of evaluation. 11/6: Hbg stable after transfusions, 8.2 >> 8.5  Complete heart block -PPM initially planned with possible tricuspid valve replacement.  Cardiology and EP are following.  Valve replacement not currently planned.  Monitor on telemetry.  Hyponatremia, euvolemic -recurrent after improvement  Na 128 again (11/6) Nephrology consulted, has signed off.  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 --Lasix 40 mg IV x 1 --recheck Na level this afternoon --Resume fluid restriction  Hypoalbuminemia -UA without proteinuria, right upper quadrant ultrasound showed no signs of cirrhosis, but given history of alcohol abuse could be related to liver dysfunction.  Monitor.  Thrombocytopenia -most likely related to alcohol abuse, possibly liver disease.  Platelet count improving. Follow CBC.  Insulin-dependent type 2 diabetes -uncontrolled.  Started on sliding scale insulin and Tradjenta for underlying insulin resistance.  Alcohol abuse -with most recent drink more than 10 days ago, no signs or symptoms of active withdrawal.  Abdominal imaging showed hepatic steatosis, no signs of cirrhosis at this point.  INR within normal limits. Patient counseled on importance of alcohol  cessation.  Right hip pain - MRI of pelvis showed moderate right hip joint effusion with right-sided periprostatic fluid collection tracking posteriorly along the greater trochanter.  No marrow signal changes to  suggest osteomyelitis.  ID following as above.  ID discussed aspirating joint with IR, but it was felt to be low yield and more risk than benefit, unlikely to change management. --Reimage if worsening pain  Symmetric bilateral intramuscular edema involving the gluteal musculature and adductor muscles, consistent with non-specific myositis. No evidence of pyomyositis.     Obesity: Body mass index is 33.47 kg/m.  Complicates overall care and prognosis.  Recommend lifestyle modifications including physical activity and diet for weight loss and overall long-term health.   DVT prophylaxis: Place and maintain sequential compression device Start: 07/15/21 1045 Place TED hose Start: 07/14/21 1245   Diet:  Diet Orders (From admission, onward)     Start     Ordered   07/16/21 1025  Diet Heart Room service appropriate? Yes; Fluid consistency: Thin  Diet effective now       Question Answer Comment  Room service appropriate? Yes   Fluid consistency: Thin      07/16/21 1024              Code Status: Full Code   Subjective 07/18/21    Pt awake resting in bed when seen this morning.  He reports still not having a bowel movement but is passing gas and hopes to have a BM soon.  Hopes to go home soon.  Denies chest pain or shortness of breath, nausea vomiting, cough congestion or fevers chills.     Disposition Plan & Communication   Status is: Inpatient  Remains inpatient appropriate because: Electrolyte abnormality and close monitoring of anemia requiring blood transfusions this admission.   Consults, Procedures, Significant Events   Consultants:  Cardiology Gastroenterology  Procedures:  EGD and colonoscopy 07/16/21  Antimicrobials:  Anti-infectives (From admission, onward)    Start     Dose/Rate Route Frequency Ordered Stop   07/16/21 0000  ceFAZolin (ANCEF) IVPB        2 g Intravenous Every 8 hours 07/16/21 1233 08/17/21 2359   07/08/21 2200  ceFAZolin (ANCEF) IVPB 2g/100  mL premix        2 g 200 mL/hr over 30 Minutes Intravenous Every 8 hours 07/08/21 1725           Micro    Objective   Vitals:   07/17/21 2343 07/18/21 0355 07/18/21 0913 07/18/21 1144  BP: (!) 125/52 (!) 126/54 (!) 126/53 131/62  Pulse: 72 66 68 82  Resp: 16 18 16 16   Temp: 98.2 F (36.8 C) 98.4 F (36.9 C) 98.4 F (36.9 C) 97.6 F (36.4 C)  TempSrc: Oral Oral Oral Oral  SpO2: 96% 98% 96% 96%  Weight:  105.8 kg    Height:        Intake/Output Summary (Last 24 hours) at 07/18/2021 1444 Last data filed at 07/18/2021 1424 Gross per 24 hour  Intake 360 ml  Output 3825 ml  Net -3465 ml   Filed Weights   07/16/21 0800 07/17/21 0609 07/18/21 0355  Weight: 104.8 kg 104.5 kg 105.8 kg    Physical Exam:  General exam: awake, alert, no acute distress Respiratory system: Lungs clear bilaterally, normal respiratory effort, on room air. Cardiovascular system: RRR, no lower extremity edema, persistent left greater than right upper extremity edema Gastrointestinal system: Abdomen soft nontender. Central nervous system: A&O x3. no gross focal  neurologic deficits, normal speech Extremities: moves all,normal tone Psychiatry: normal mood, congruent affect, judgement and insight appear normal  Labs   Data Reviewed: I have personally reviewed following labs and imaging studies  CBC: Recent Labs  Lab 07/13/21 0340 07/14/21 1833 07/15/21 0630 07/15/21 1612 07/16/21 1125 07/17/21 0238 07/17/21 0239 07/17/21 1720 07/18/21 0906  WBC 8.9  --  8.8  --  7.0  --  10.6*  --  8.9  HGB 9.1*   < > 6.0*   < > 6.4* 8.4* 8.6* 8.2* 8.5*  HCT 27.6*   < > 17.7*   < > 19.2* 25.2* 25.7* 24.4* 26.6*  MCV 97.5  --  94.1  --  96.0  --  92.8  --  96.0  PLT 132*  --  147*  --  140*  --  149*  --  140*   < > = values in this interval not displayed.   Basic Metabolic Panel: Recent Labs  Lab 07/12/21 0059 07/12/21 1633 07/13/21 0340 07/13/21 1642 07/14/21 0225 07/14/21 1517 07/15/21 0630  07/15/21 1612 07/16/21 1125 07/16/21 1217 07/17/21 0238 07/17/21 1720  NA 121*   < > 125*   < > 121*   < > 129* 128*  --  132* 131* 128*  K 4.3  --  4.4  --  5.1   < > 4.6 4.6  --  3.6 4.0 4.0  CL 92*  --  95*  --  91*   < > 98 97*  --  100 99 98  CO2 25  --  25  --  24   < > 27 26  --  27 26 24   GLUCOSE 136*  --  215*  --  270*   < > 197* 278*  --  112* 108* 170*  BUN 11  --  23*  --  47*   < > 39* 28*  --  13 10 9   CREATININE 0.96  --  0.91  --  1.10   < > 0.85 0.83  --  0.71 0.89 0.82  CALCIUM 7.1*  --  7.6*  --  7.2*   < > 7.6* 7.7*  --  7.5* 7.8* 7.6*  MG  --   --   --   --   --   --   --   --  2.3  --   --   --   PHOS 2.7  --  3.2  --  3.3  --  3.9  --   --  4.3  --   --    < > = values in this interval not displayed.   GFR: Estimated Creatinine Clearance: 119.6 mL/min (by C-G formula based on SCr of 0.82 mg/dL). Liver Function Tests: Recent Labs  Lab 07/12/21 0059 07/13/21 0340 07/14/21 0225 07/15/21 0630 07/16/21 1217  ALBUMIN <1.5* <1.5* <1.5* <1.5* 1.6*   No results for input(s): LIPASE, AMYLASE in the last 168 hours. No results for input(s): AMMONIA in the last 168 hours. Coagulation Profile: No results for input(s): INR, PROTIME in the last 168 hours. Cardiac Enzymes: No results for input(s): CKTOTAL, CKMB, CKMBINDEX, TROPONINI in the last 168 hours. BNP (last 3 results) No results for input(s): PROBNP in the last 8760 hours. HbA1C: No results for input(s): HGBA1C in the last 72 hours. CBG: Recent Labs  Lab 07/17/21 1211 07/17/21 1624 07/17/21 2104 07/18/21 0606 07/18/21 1146  GLUCAP 144* 170* 163* 109* 170*   Lipid Profile: No results for  input(s): CHOL, HDL, LDLCALC, TRIG, CHOLHDL, LDLDIRECT in the last 72 hours. Thyroid Function Tests: No results for input(s): TSH, T4TOTAL, FREET4, T3FREE, THYROIDAB in the last 72 hours. Anemia Panel: No results for input(s): VITAMINB12, FOLATE, FERRITIN, TIBC, IRON, RETICCTPCT in the last 72 hours. Sepsis  Labs: No results for input(s): PROCALCITON, LATICACIDVEN in the last 168 hours.  No results found for this or any previous visit (from the past 240 hour(s)).    Imaging Studies   No results found.   Medications   Scheduled Meds:  sodium chloride   Intravenous Once   Chlorhexidine Gluconate Cloth  6 each Topical Daily   folic acid  1 mg Oral Daily   insulin aspart  0-15 Units Subcutaneous TID WC   insulin aspart  0-5 Units Subcutaneous QHS   insulin aspart  4 Units Subcutaneous TID WC   insulin glargine-yfgn  25 Units Subcutaneous QHS   linagliptin  5 mg Oral Daily   multivitamin  1 tablet Oral Daily   pantoprazole  40 mg Oral BID AC   polyethylene glycol  17 g Oral Daily   senna-docusate  1 tablet Oral QHS   sodium chloride flush  10-40 mL Intracatheter Q12H   thiamine  100 mg Oral Daily   Continuous Infusions:   ceFAZolin (ANCEF) IV 2 g (07/18/21 1437)       LOS: 10 days    Time spent: 25 minutes    Ezekiel Slocumb, DO [facility]  07/18/2021, 2:44 PM      If 7PM-7AM, please contact night-coverage. How to contact the [facility] Attending or Consulting provider [place] or covering provider during after hours [place], for this patient?    Check the care team in [facility] and look for a) attending/consulting [facility] provider listed and b) the [facility] team listed Log into [email or web address] and use [employer]'s universal password to access. If you do not have the password, please contact the hospital operator. Locate the [facility] provider you are looking for under [facility] and page to a number that you can be directly reached. If you still have difficulty reaching the provider, please page the [facility] (Director on Call) for the Hospitalists listed on amion for assistance.

## 2021-07-18 NOTE — Progress Notes (Signed)
Progress Note  Patient Name: Joshua Cole Date of Encounter: 07/18/2021  Primary Cardiologist:   Kathlyn Sacramento, MD   Subjective   No pain.  No SOB.    Inpatient Medications    Scheduled Meds:  sodium chloride   Intravenous Once   sodium chloride   Intravenous Once   Chlorhexidine Gluconate Cloth  6 each Topical Daily   folic acid  1 mg Oral Daily   insulin aspart  0-15 Units Subcutaneous TID WC   insulin aspart  0-5 Units Subcutaneous QHS   insulin aspart  4 Units Subcutaneous TID WC   insulin glargine-yfgn  25 Units Subcutaneous QHS   linagliptin  5 mg Oral Daily   multivitamin  1 tablet Oral Daily   pantoprazole  40 mg Oral BID AC   polyethylene glycol  17 g Oral Daily   senna-docusate  1 tablet Oral QHS   sodium chloride flush  10-40 mL Intracatheter Q12H   thiamine  100 mg Oral Daily   Continuous Infusions:   ceFAZolin (ANCEF) IV 2 g (07/18/21 0615)   PRN Meds: acetaminophen, alum & mag hydroxide-simeth, LORazepam, magnesium hydroxide, morphine injection, nitroGLYCERIN, ondansetron (ZOFRAN) IV, oxyCODONE, sodium chloride flush   Vital Signs    Vitals:   07/17/21 1947 07/17/21 2343 07/18/21 0355 07/18/21 0913  BP: (!) 120/53 (!) 125/52 (!) 126/54 (!) 126/53  Pulse: 70 72 66 68  Resp: 18 16 18 16   Temp: 98.5 F (36.9 C) 98.2 F (36.8 C) 98.4 F (36.9 C) 98.4 F (36.9 C)  TempSrc: Oral Oral Oral Oral  SpO2: 96% 96% 98% 96%  Weight:   105.8 kg   Height:        Intake/Output Summary (Last 24 hours) at 07/18/2021 1025 Last data filed at 07/18/2021 0913 Gross per 24 hour  Intake 360 ml  Output 3025 ml  Net -2665 ml   Filed Weights   07/16/21 0800 07/17/21 0609 07/18/21 0355  Weight: 104.8 kg 104.5 kg 105.8 kg    Telemetry    NSR with CHB and narrow escape rhythm  - Personally Reviewed  ECG    NA - Personally Reviewed  Physical Exam   GENERAL:  Well appearing NECK:  No jugular venous distention, waveform within normal limits, carotid  upstroke brisk and symmetric, no bruits, no thyromegaly LUNGS:  Clear to auscultation bilaterally CHEST:  Unremarkable HEART:  PMI not displaced or sustained,S1 and S2 within normal limits, no S3, no S4, no clicks, no rubs, no murmurs ABD:  Flat, positive bowel sounds normal in frequency in pitch, no bruits, no rebound, no guarding, no midline pulsatile mass, no hepatomegaly, no splenomegaly EXT:  2 plus pulses throughout, no edema, no cyanosis no clubbing   Labs    Chemistry Recent Labs  Lab 07/14/21 0225 07/14/21 1517 07/15/21 0630 07/15/21 1612 07/16/21 1217 07/17/21 0238 07/17/21 1720  NA 121*   < > 129*   < > 132* 131* 128*  K 5.1   < > 4.6   < > 3.6 4.0 4.0  CL 91*   < > 98   < > 100 99 98  CO2 24   < > 27   < > 27 26 24   GLUCOSE 270*   < > 197*   < > 112* 108* 170*  BUN 47*   < > 39*   < > 13 10 9   CREATININE 1.10   < > 0.85   < > 0.71 0.89 0.82  CALCIUM 7.2*   < >  7.6*   < > 7.5* 7.8* 7.6*  ALBUMIN <1.5*  --  <1.5*  --  1.6*  --   --   GFRNONAA >60   < > >60   < > >60 >60 >60  ANIONGAP 6   < > 4*   < > 5 6 6    < > = values in this interval not displayed.     Hematology Recent Labs  Lab 07/15/21 0630 07/15/21 1612 07/16/21 1125 07/17/21 0238 07/17/21 0239 07/17/21 1720  WBC 8.8  --  7.0  --  10.6*  --   RBC 1.88*  --  2.00*  --  2.77*  --   HGB 6.0*   < > 6.4* 8.4* 8.6* 8.2*  HCT 17.7*   < > 19.2* 25.2* 25.7* 24.4*  MCV 94.1  --  96.0  --  92.8  --   MCH 31.9  --  32.0  --  31.0  --   MCHC 33.9  --  33.3  --  33.5  --   RDW 14.0  --  14.6  --  16.1*  --   PLT 147*  --  140*  --  149*  --    < > = values in this interval not displayed.    Cardiac EnzymesNo results for input(s): TROPONINI in the last 168 hours. No results for input(s): TROPIPOC in the last 168 hours.   BNPNo results for input(s): BNP, PROBNP in the last 168 hours.   DDimer No results for input(s): DDIMER in the last 168 hours.   Radiology    No results found.  Cardiac Studies    TEE  1. Left ventricular ejection fraction, by estimation, is 55 to 60%. The  left ventricle has normal function.   2. Right ventricular systolic function is normal. The right ventricular  size is normal.   3. No left atrial/left atrial appendage thrombus was detected.   4. The mitral valve is normal in structure. Mild mitral valve  regurgitation.   5. There is a mobile mass attached to the tricuspid valve (clip 132)  consistent with a vegetation given the current clinical context.. The  tricuspid valve is degenerative.   6. The aortic valve is tricuspid. Aortic valve regurgitation is not  visualized.   Patient Profile     60 y.o. male with gout, alcohol use, bilateral THA, and poor dental hygiene who was admitted to [facility] on 07/03/2021 with MSSA bacteremia and found to have a tricuspid valve vegetation complicated by complete heart block with good escape rhythm who is being seen 07/08/2021 for the evaluation of the above.  Assessment & Plan    MSSA bacteremia with tricuspid valve vegetation with acute metabolic encephalopathy:  Plan six weeks of IV Ancef.  Need to arrange home antibiotics.     Complete heart block: In the setting of MSSA bacteremia with tricuspid valve vegetation.   No plan for pacing at this time.     Anemia:   Ulcers.  On PPI bid and transfused.  Hgb is holding in the low to mid 8s.    Hyponatremia:   Na is drifting down.  Will restrict free water.  He has been drinking a lot of water because of dry mouth.      Thrombocytopenia: Improved  Alcohol use:  Likely contributor to several issues above.   Patient has been counseled.     DM: Newly diagnosed  with A1c 7.8. Plan per primary team.    Right  hip pain:  Effusion noted but conservative management for now.    For questions or updates, please contact [place] Please consult [email or web address] for contact info under Cardiology/STEMI.   Signed, Minus Breeding, MD  07/18/2021, 10:25 AM

## 2021-07-18 NOTE — Progress Notes (Addendum)
Occupational Therapy Treatment Patient Details Name: Joshua Cole MRN: [id number] DOB: 24-Dec-1960 Today's Date: 07/18/2021   History of present illness Pt is a 60 y.o. male admitted 07/03/21 with c/o weakness, joint pain, confusion, nausea/vomiting, fever; of note, pt recently received flu shot and COVID booster on 10/14. Workup for hyponatremia, acute metabolic encephalopathy of unclear etiology. Brain MRI negative for acute injury. Blood cultures (+) staph aureus. Pelvic MRI 10/24 showed bilateral hip joint effusions; edema consistent with myositis. 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 Course complicated by development of heart block. Transfer to [facility] on 10/27 for cardiothoracic sx consult. Chest CTA negative for PE, concern for multiple ill-defined lesions. PMH includes gout, ETOH use.   OT comments  Pt pleasant and agreeable to session, reporting increased discomfort with AROM of L shoulder that has increased during stay reporting medical team confirms rotator cuff tear. OT facilitated AROM/strengthening activities to shoulder/scapular and bicep, reviewed modification of task for ADL/dressing, as well as educated pt on elevation and ROM activities to address significant presence of edema throughout L UE distally>proximally. Discussed modification of task completion ie. ECT's to address reports of fatigue with sustained standing during ADLs. Nursing present at end of session. Pt reports his plan is for d/c to home, if that is the case OT would strongly recommend HHOT, with tub transfer bench and 3-in-1, and at least initial 24hr S/A for safety. OT will continue to follow acutely.    Recommendations for follow up therapy are one component of a multi-disciplinary discharge planning process, led by the attending physician.  Recommendations may be updated based on patient status, additional functional criteria and insurance authorization.    Follow Up Recommendations  Home  health OT    Assistance Recommended at Discharge Frequent or constant Supervision/Assistance  Equipment Recommendations  Tub/shower seat (or shower chair)    Recommendations for Other Services      Precautions / Restrictions Precautions Precautions: Fall Restrictions Weight Bearing Restrictions: No       Mobility Bed Mobility                    Transfers Overall transfer level: Needs assistance Equipment used: Rolling walker (2 wheels) Transfers: Sit to/from Stand;Stand Pivot Transfers Sit to Stand: Min guard;From elevated surface Stand pivot transfers: Min guard (with RW)         General transfer comment: cues for technique for weight shift and hand placement, reviewed sequencing with RW to maximize indep, fair safety     Balance                                           ADL either performed or assessed with clinical judgement   ADL                                               Vision       Perception     Praxis      Cognition Arousal/Alertness: Awake/alert Behavior During Therapy: WFL for tasks assessed/performed Overall Cognitive Status: Within Functional Limits for tasks assessed                     Current Attention Level: Sustained;Selective  General Comments: overall appears to be mostly improved, sustained attention intact, fair recall. appears to be clsoe or near cognitive baseline.          Exercises Exercises: Shoulder General Exercises - Upper Extremity Elbow Flexion: AROM;10 reps;Seated;Left Shoulder Exercises Shoulder Flexion: AAROM;Self ROM;Left;10 reps;Seated Shoulder ABduction: AAROM;10 reps;Seated;Left Elbow Flexion: AAROM;10 reps;Seated;Left Elbow Extension: AAROM;10 reps;Seated;Left (sustained flexion wtih slowed speed of extension)   Shoulder Instructions       General Comments      Pertinent Vitals/ Pain       Pain Assessment: 0-10 Pain Score: 2   Pain Location: L shoulder with flexion Pain Descriptors / Indicators: Aching Pain Intervention(s): Limited activity within patient's tolerance  Home Living                                          Prior Functioning/Environment              Frequency  Min 2X/week        Progress Toward Goals  OT Goals(current goals can now be found in the care plan section)  Progress towards OT goals: Progressing toward goals  Acute Rehab OT Goals Patient Stated Goal: to finally get home OT Goal Formulation: With patient Time For Goal Achievement: 07/26/21 Potential to Achieve Goals: Good ADL Goals Pt Will Perform Grooming: with supervision;standing Pt Will Perform Upper Body Dressing: with set-up;sitting Pt Will Perform Lower Body Dressing: with min assist;with adaptive equipment;sit to/from stand Pt Will Transfer to Toilet: with supervision;ambulating;bedside commode Pt Will Perform Toileting - Clothing Manipulation and hygiene: with supervision;sit to/from stand Pt/caregiver will Perform Home Exercise Program: Left upper extremity;Independently;Increased ROM Additional ADL Goal #1: Pt will perform bed mobility with min assist in preparation for ADL.  Plan Discharge plan remains appropriate;Frequency remains appropriate    Co-evaluation                 AM-PAC OT "6 Clicks" Daily Activity     Outcome Measure   Help from another person eating meals?: None Help from another person taking care of personal grooming?: A Little Help from another person toileting, which includes using toliet, bedpan, or urinal?: A Lot Help from another person bathing (including washing, rinsing, drying)?: A Little Help from another person to put on and taking off regular upper body clothing?: A Little Help from another person to put on and taking off regular lower body clothing?: A Lot 6 Click Score: 17    End of Session Equipment Utilized During Treatment: Rolling walker (2  wheels)  OT Visit Diagnosis: Muscle weakness (generalized) (M62.81);Pain;Unsteadiness on feet (R26.81) Pain - Right/Left: Left Pain - part of body: Shoulder   Activity Tolerance Patient tolerated treatment well   Patient Left with call bell/phone within reach;in chair   Nurse Communication Mobility status        Time: 0093-8182 OT Time Calculation (min): 23 min  Charges: OT General Charges $OT Visit: 1 Visit OT Treatments $Self Care/Home Management : 8-22 mins $Therapeutic Activity: 8-22 mins  Edana Aguado OTR/L acute rehab services Office: [phone]   Toula Moos Marchello Rothgeb 07/18/2021, 10:13 AM

## 2021-07-19 ENCOUNTER — Other Ambulatory Visit: Payer: Self-pay

## 2021-07-19 LAB — HEPATIC FUNCTION PANEL
ALT: 55 U/L — ABNORMAL HIGH (ref 0–44)
AST: 83 U/L — ABNORMAL HIGH (ref 15–41)
Albumin: 1.7 g/dL — ABNORMAL LOW (ref 3.5–5.0)
Alkaline Phosphatase: 70 U/L (ref 38–126)
Bilirubin, Direct: 0.1 mg/dL (ref 0.0–0.2)
Indirect Bilirubin: 0.5 mg/dL (ref 0.3–0.9)
Total Bilirubin: 0.6 mg/dL (ref 0.3–1.2)
Total Protein: 6.5 g/dL (ref 6.5–8.1)

## 2021-07-19 LAB — HEMOGLOBIN AND HEMATOCRIT, BLOOD
HCT: 23.7 % — ABNORMAL LOW (ref 39.0–52.0)
Hemoglobin: 7.7 g/dL — ABNORMAL LOW (ref 13.0–17.0)

## 2021-07-19 LAB — GLUCOSE, CAPILLARY
Glucose-Capillary: 180 mg/dL — ABNORMAL HIGH (ref 70–99)
Glucose-Capillary: 170 mg/dL — ABNORMAL HIGH (ref 70–99)
Glucose-Capillary: 110 mg/dL — ABNORMAL HIGH (ref 70–99)
Glucose-Capillary: 165 mg/dL — ABNORMAL HIGH (ref 70–99)

## 2021-07-19 LAB — OSMOLALITY
Osmolality: 288 mOsm/kg (ref 275–295)
Osmolality: 282 mOsm/kg (ref 275–295)

## 2021-07-19 LAB — SODIUM: Sodium: 133 mmol/L — ABNORMAL LOW (ref 135–145)

## 2021-07-19 LAB — OSMOLALITY, URINE: Osmolality, Ur: 505 mOsm/kg (ref 300–900)

## 2021-07-19 MED ORDER — TRAZODONE HCL 100 MG PO TABS: 100.0000 mg | ORAL_TABLET | Freq: Every evening | ORAL | Status: DC | PRN

## 2021-07-19 MED ORDER — SODIUM CHLORIDE 0.9 % IV BOLUS
500.0000 mL | Freq: Once | INTRAVENOUS | Status: AC
  Administered 2021-07-19: 500 mL via INTRAVENOUS

## 2021-07-19 MED ORDER — UREA 15 G PO PACK
15.0000 g | PACK | Freq: Every day | ORAL | Status: DC
Start: 2021-07-19 — End: 2021-07-20
  Administered 2021-07-19 – 2021-07-20 (×2): 15 g via ORAL
  Filled 2021-07-19 (×4): qty 1

## 2021-07-19 MED ORDER — TRAZODONE HCL 50 MG PO TABS: 50.0000 mg | ORAL_TABLET | Freq: Every evening | ORAL | Status: DC | PRN

## 2021-07-19 NOTE — Progress Notes (Signed)
Physical Therapy Treatment Patient Details Name: Joshua Cole MRN: [id number] DOB: 1961-05-12 Today's Date: 07/19/2021   History of Present Illness Pt is a 60 y.o. male admitted 07/03/21 with c/o weakness, joint pain, confusion, nausea/vomiting, fever; of note, pt recently received flu shot and COVID booster on 10/14. Workup for hyponatremia, acute metabolic encephalopathy of unclear etiology. Brain MRI negative for acute injury. Blood cultures (+) staph aureus. Pelvic MRI 10/24 showed bilateral hip joint effusions; edema consistent with myositis. 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 Course complicated by development of heart block. Transfer to [facility] on 10/27 for cardiothoracic sx consult. Chest CTA negative for PE, concern for multiple ill-defined lesions. PMH includes gout, ETOH use.    PT Comments    Pt continues to progress with mobility and eager to go home. Practiced stairs and they were more difficult than he anticipated but he was able to do them.    Recommendations for follow up therapy are one component of a multi-disciplinary discharge planning process, led by the attending physician.  Recommendations may be updated based on patient status, additional functional criteria and insurance authorization.  Follow Up Recommendations  Home health PT     Assistance Recommended at Discharge Intermittent Supervision/Assistance  Equipment Recommendations  None recommended by PT (daughter has gotten a walker)    Recommendations for Other Services       Precautions / Restrictions Precautions Precautions: Fall     Mobility  Bed Mobility               General bed mobility comments: Pt up in chair    Transfers Overall transfer level: Modified independent Equipment used: Rolling walker (2 wheels)   Sit to Stand: Modified independent (Device/Increase time)           General transfer comment: Good technique. Pt has to assist lt hand up to walker  prior to stand due to lt shoulder pain/weakness.    Ambulation/Gait Ambulation/Gait assistance: Supervision Gait Distance (Feet): 250 Feet Assistive device: Rolling walker (2 wheels) Gait Pattern/deviations: Step-through pattern;Decreased stride length Gait velocity: decr Gait velocity interpretation: 1.31 - 2.62 ft/sec, indicative of limited community ambulator   General Gait Details: Supervision for safety.   Stairs Stairs: Yes Stairs assistance: Min assist Stair Management: One rail Right;Forwards;Backwards;Step to pattern (hand held on left) Number of Stairs: 2 General stair comments: Assist for support. Verbal cues for sequence. Step up forward and stepped down backward due to IV line   Wheelchair Mobility    Modified Rankin (Stroke Patients Only)       Balance Overall balance assessment: Needs assistance Sitting-balance support: No upper extremity supported;Feet supported Sitting balance-Leahy Scale: Good     Standing balance support: No upper extremity supported Standing balance-Leahy Scale: Fair                              Cognition Arousal/Alertness: Awake/alert Behavior During Therapy: WFL for tasks assessed/performed Overall Cognitive Status: Within Functional Limits for tasks assessed                                          Exercises      General Comments General comments (skin integrity, edema, etc.): No signs or symptoms of dizziness/orthostasis      Pertinent Vitals/Pain Pain Assessment: Faces Faces Pain Scale: Hurts a little bit Pain Location: lt  shoulder Pain Descriptors / Indicators: Aching Pain Intervention(s): Limited activity within patient's tolerance    Home Living                          Prior Function            PT Goals (current goals can now be found in the care plan section) Acute Rehab PT Goals Patient Stated Goal: go home Progress towards PT goals: Progressing toward goals     Frequency    Min 3X/week      PT Plan Current plan remains appropriate    Co-evaluation              AM-PAC PT "6 Clicks" Mobility   Outcome Measure  Help needed turning from your back to your side while in a flat bed without using bedrails?: None Help needed moving from lying on your back to sitting on the side of a flat bed without using bedrails?: None Help needed moving to and from a bed to a chair (including a wheelchair)?: A Little Help needed standing up from a chair using your arms (e.g., wheelchair or bedside chair)?: None Help needed to walk in hospital room?: A Little Help needed climbing 3-5 steps with a railing? : A Little 6 Click Score: 21    End of Session   Activity Tolerance: Patient tolerated treatment well Patient left: in chair;with call bell/phone within reach Nurse Communication: Mobility status PT Visit Diagnosis: Unsteadiness on feet (R26.81);Other abnormalities of gait and mobility (R26.89);Pain Pain - Right/Left: Left Pain - part of body: Shoulder     Time: 5790-3833 PT Time Calculation (min) (ACUTE ONLY): 15 min  Charges:  $Gait Training: 8-22 mins                     [place] Pager [phone] Office [place] 07/19/2021, 11:34 AM

## 2021-07-19 NOTE — Progress Notes (Signed)
Progress Note  Patient Name: Joshua Cole Date of Encounter: 07/19/2021  Primary Cardiologist:   Kathlyn Sacramento, MD   Subjective   No chest pain. Persistent complete heart block. Plan for 6 weeks antibiotics for MSSA endocarditis of the TV.  Inpatient Medications    Scheduled Meds:  sodium chloride   Intravenous Once   Chlorhexidine Gluconate Cloth  6 each Topical Daily   folic acid  1 mg Oral Daily   insulin aspart  0-15 Units Subcutaneous TID WC   insulin aspart  0-5 Units Subcutaneous QHS   insulin aspart  4 Units Subcutaneous TID WC   insulin glargine-yfgn  25 Units Subcutaneous QHS   linagliptin  5 mg Oral Daily   multivitamin  1 tablet Oral Daily   pantoprazole  40 mg Oral BID AC   polyethylene glycol  17 g Oral Daily   senna-docusate  1 tablet Oral QHS   sodium chloride flush  10-40 mL Intracatheter Q12H   thiamine  100 mg Oral Daily   Continuous Infusions:   ceFAZolin (ANCEF) IV 2 g (07/19/21 0626)   sodium chloride     PRN Meds: acetaminophen, alum & mag hydroxide-simeth, LORazepam, magnesium hydroxide, morphine injection, nitroGLYCERIN, ondansetron (ZOFRAN) IV, oxyCODONE, sodium chloride flush   Vital Signs    Vitals:   07/18/21 2342 07/19/21 0329 07/19/21 0616 07/19/21 0750  BP: (!) 126/55 130/60    Pulse: 65 64    Resp: 18 18  16   Temp: 98.2 F (36.8 C) 99.5 F (37.5 C)  99.2 F (37.3 C)  TempSrc: Oral Oral  Oral  SpO2: 97% 95%    Weight:   105.8 kg   Height:        Intake/Output Summary (Last 24 hours) at 07/19/2021 0854 Last data filed at 07/19/2021 0616 Gross per 24 hour  Intake 480 ml  Output 3275 ml  Net -2795 ml   Filed Weights   07/17/21 0609 07/18/21 0355 07/19/21 0616  Weight: 104.5 kg 105.8 kg 105.8 kg    Telemetry    NSR with CHB and narrow escape rhythm  - Personally Reviewed  ECG    NA - Personally Reviewed  Physical Exam   GENERAL:  Well appearing NECK:  No jugular venous distention, waveform within normal  limits, carotid upstroke brisk and symmetric, no bruits, no thyromegaly LUNGS:  Clear to auscultation bilaterally CHEST:  Unremarkable HEART:  PMI not displaced or sustained,S1 and S2 within normal limits, no S3, no S4, no clicks, no rubs, no murmurs ABD:  Flat, positive bowel sounds normal in frequency in pitch, no bruits, no rebound, no guarding, no midline pulsatile mass, no hepatomegaly, no splenomegaly EXT:  2 plus pulses throughout, no edema, no cyanosis no clubbing   Labs    Chemistry Recent Labs  Lab 07/14/21 0225 07/14/21 1517 07/15/21 0630 07/15/21 1612 07/16/21 1217 07/17/21 0238 07/17/21 1720 07/18/21 1500  NA 121*   < > 129*   < > 132* 131* 128* 126*  K 5.1   < > 4.6   < > 3.6 4.0 4.0 3.8  CL 91*   < > 98   < > 100 99 98 93*  CO2 24   < > 27   < > 27 26 24 26   GLUCOSE 270*   < > 197*   < > 112* 108* 170* 256*  BUN 47*   < > 39*   < > 13 10 9 8   CREATININE 1.10   < >  0.85   < > 0.71 0.89 0.82 0.79  CALCIUM 7.2*   < > 7.6*   < > 7.5* 7.8* 7.6* 7.3*  ALBUMIN <1.5*  --  <1.5*  --  1.6*  --   --   --   GFRNONAA >60   < > >60   < > >60 >60 >60 >60  ANIONGAP 6   < > 4*   < > 5 6 6 7    < > = values in this interval not displayed.     Hematology Recent Labs  Lab 07/16/21 1125 07/17/21 0238 07/17/21 0239 07/17/21 1720 07/18/21 0906 07/19/21 0336  WBC 7.0  --  10.6*  --  8.9  --   RBC 2.00*  --  2.77*  --  2.77*  --   HGB 6.4*   < > 8.6* 8.2* 8.5* 7.7*  HCT 19.2*   < > 25.7* 24.4* 26.6* 23.7*  MCV 96.0  --  92.8  --  96.0  --   MCH 32.0  --  31.0  --  30.7  --   MCHC 33.3  --  33.5  --  32.0  --   RDW 14.6  --  16.1*  --  16.1*  --   PLT 140*  --  149*  --  140*  --    < > = values in this interval not displayed.    Cardiac EnzymesNo results for input(s): TROPONINI in the last 168 hours. No results for input(s): TROPIPOC in the last 168 hours.   BNPNo results for input(s): BNP, PROBNP in the last 168 hours.   DDimer No results for input(s): DDIMER in the  last 168 hours.   Radiology    No results found.  Cardiac Studies   TEE  1. Left ventricular ejection fraction, by estimation, is 55 to 60%. The  left ventricle has normal function.   2. Right ventricular systolic function is normal. The right ventricular  size is normal.   3. No left atrial/left atrial appendage thrombus was detected.   4. The mitral valve is normal in structure. Mild mitral valve  regurgitation.   5. There is a mobile mass attached to the tricuspid valve (clip 132)  consistent with a vegetation given the current clinical context.. The  tricuspid valve is degenerative.   6. The aortic valve is tricuspid. Aortic valve regurgitation is not  visualized.   Patient Profile     60 y.o. male with gout, alcohol use, bilateral THA, and poor dental hygiene who was [facility]21 with MSSA bacteremia and found to have a tricuspid valve vegetation complicated by complete heart block with good escape rhythm who is being seen 07/08/2021 for the evaluation of the above.  Assessment & Plan    MSSA bacteremia with tricuspid valve vegetation with acute metabolic encephalopathy:  Plan six weeks of IV Ancef.     Complete heart block: In the setting of MSSA bacteremia with tricuspid valve vegetation.   No plan for pacing at this time. Stable junctional escape.  Anemia:   Ulcers.  On PPI bid and transfused.  Hgb is holding in the low to mid 8s.    Hyponatremia:   Na is drifting down.  Will restrict free water.  He has been drinking a lot of water because of dry mouth.      Thrombocytopenia: Improved  Alcohol use:  Likely contributor to several issues above.   Patient has been counseled.  DM: Newly diagnosed  with A1c 7.8. Plan per primary team.    Right hip pain:  Effusion noted but conservative management for now.    [facility] will sign off.   Medication Recommendations:  avoid AVN blockers Other recommendations (labs, testing, etc):  none Follow  up as an outpatient:  Appt with Dr. Quentin Ore (cardiac EP) on 08/25/21   For questions or updates, please contact [place] Please consult [email or web address] for contact info under Cardiology/STEMI.   Pixie Casino, MD, [facility], [place] Director of the Advanced Lipid Disorders &  Cardiovascular Risk Reduction Clinic Diplomate of the American Board of Clinical Lipidology Attending Cardiologist  Direct Dial: [phone]  Fax: [phone]  Website:  [email or web address]  Pixie Casino, MD  07/19/2021, 8:54 AM

## 2021-07-19 NOTE — Progress Notes (Signed)
PROGRESS NOTE    Joshua Cole   HUD:[id number]  DOB: [birth year]  PCP: Pcp, No    DOA: 07/08/2021 LOS: 11    Brief Narrative / Hospital Course to Date:   Per Dr. Roosevelt Locks Consult Note: "60 year old male patient with past medical history of gout, alcohol use presented with generalized weakness, bilateral hip pain left shoulder pain, who was admitted to [facility].   Patient was intermittently confused in the ED, blood work showed MSSA bacteremia.  CT angiogram negative for PE, then TEE was positive for tricuspid valve vegetation.  During hospital stay at [facility], patient developed third-degree AV block on 10/26, and transferred to [facility] for further management.   Initial plan was for CT surgery evaluation for TR replacement, CT surgery, evaluated patient and recommended conservative management only, and no surgery.   Patient has been on Ancef since.   For the bilateral hip pain, MRI showed small to moderate right-sided joint effusion.  Patient has mild bilateral hip pain right>left.   Patient was also found to have significant elevation of blood glucose, A1c 7.8.  Patient was started on insulin sliding scale.   Blood work shows old patient has euvolemic hyponatremia likely from alcohol abuse.  Nephrology was on board and started patient on Lasix.  Patient has no history of cirrhosis, acute ultrasound showed fatty liver but no signs of cirrhosis.  Albumin level decreased to 2.7, INR within normal limits.  UA showed no protein."  Assessment & Plan   Active Problems:   Acute metabolic encephalopathy   Generalized weakness   Bacteremia   Acute bacterial endocarditis   Heart block   MSSA bacteremia   Acute blood loss anemia   Heart block AV complete (HCC)   Abnormal LFTs   Duodenal ulcer   History of colonic polyps   MSSA bacteremia with tricuspid valve endocarditis --Cardiology and infectious disease following --Continue Ancef --Repeat echo in 6 to 8 weeks  Acute blood loss anemia,  abdominal pain, history of alcohol abuse /esophageal and duodenal ulcers, gastritis -  --Continue PPI twice daily --Monitor hemoglobin and transfuse if less than 7 11/3: Received 2 unit PRBCs overnight with improvement of hemoglobin from 5.2-6.0>>7.3 11/4: Hemoglobin this morning 6.4, given 2 units pRBC's EGD and colonoscopy morning of 11/4.  EGD showed nonbleeding esophageal ulcer and multiple duodenal ulcers in addition to gastritis.  Ulcers thought to be source of patient's blood loss although not actively bleeding at the time of evaluation. 11/6: Hbg stable after transfusions, 8.2 >> 8.5  Complete heart block -PPM initially planned with possible tricuspid valve replacement.  Cardiology and EP are following.  Valve replacement not currently planned.  Monitor on telemetry.  Hyponatremia, euvolemic -recurrent after improvement Na 128 >>126 after Lasix yesterday. Nephrology consulted Seems likely beer [place] mania / low solute consumption. --small fluid challenge and repeat Na this afternoon --resume fluid restriction  Hypoalbuminemia -UA without proteinuria, right upper quadrant ultrasound showed no signs of cirrhosis, but given history of alcohol abuse could be related to liver dysfunction.  Monitor.  Thrombocytopenia -most likely related to alcohol abuse, possibly liver disease.  Platelet count improving. Follow CBC.  Insulin-dependent type 2 diabetes -uncontrolled.  Started on sliding scale insulin and Tradjenta for underlying insulin resistance.  Alcohol abuse -with most recent drink more than 10 days ago, no signs or symptoms of active withdrawal.  Abdominal imaging showed hepatic steatosis, no signs of cirrhosis at this point.  INR within normal limits. Patient counseled on importance of alcohol cessation.  Right hip pain - MRI of pelvis showed moderate right hip joint effusion with right-sided periprostatic fluid collection tracking posteriorly along the greater trochanter.  No marrow  signal changes to suggest osteomyelitis.  ID following as above.  ID discussed aspirating joint with IR, but it was felt to be low yield and more risk than benefit, unlikely to change management. --Reimage if worsening pain  Symmetric bilateral intramuscular edema involving the gluteal musculature and adductor muscles, consistent with non-specific myositis. No evidence of pyomyositis.     Obesity: Body mass index is 33.47 kg/m.  Complicates overall care and prognosis.  Recommend lifestyle modifications including physical activity and diet for weight loss and overall long-term health.   DVT prophylaxis: Place and maintain sequential compression device Start: 07/15/21 1045 Place TED hose Start: 07/14/21 1245   Diet:  Diet Orders (From admission, onward)     Start     Ordered   07/18/21 1459  Diet Heart Room service appropriate? Yes; Fluid consistency: Thin; Fluid restriction: 1500 mL Fluid  Diet effective now       Question Answer Comment  Room service appropriate? Yes   Fluid consistency: Thin   Fluid restriction: 1500 mL Fluid      07/18/21 1458              Code Status: Full Code   Subjective 07/19/21    Pt up in recliner.  Reports he feels well.  Ready to go home.  Reports sore bottom from laying and sitting all this time.  Request something to help him sleep.  Denies F/C, CP, SOB, N/V.   Disposition Plan & Communication   Status is: Inpatient  Remains inpatient appropriate because: Electrolyte abnormalities worsening as above and under evaluation & close monitoring   Consults, Procedures, Significant Events   Consultants:  Cardiology Gastroenterology Nephrology  Procedures:  EGD and colonoscopy 07/16/21  Antimicrobials:  Anti-infectives (From admission, onward)    Start     Dose/Rate Route Frequency Ordered Stop   07/16/21 0000  ceFAZolin (ANCEF) IVPB        2 g Intravenous Every 8 hours 07/16/21 1233 08/17/21 2359   07/08/21 2200  ceFAZolin (ANCEF)  IVPB 2g/100 mL premix        2 g 200 mL/hr over 30 Minutes Intravenous Every 8 hours 07/08/21 1725           Micro    Objective   Vitals:   07/19/21 0329 07/19/21 0616 07/19/21 0750 07/19/21 1205  BP: 130/60   (!) 120/55  Pulse: 64   81  Resp: 18  16 18   Temp: 99.5 F (37.5 C)  99.2 F (37.3 C) 98.7 F (37.1 C)  TempSrc: Oral  Oral Oral  SpO2: 95%   95%  Weight:  105.8 kg    Height:        Intake/Output Summary (Last 24 hours) at 07/19/2021 1617 Last data filed at 07/19/2021 0941 Gross per 24 hour  Intake 600 ml  Output 1875 ml  Net -1275 ml   Filed Weights   07/17/21 0609 07/18/21 0355 07/19/21 0616  Weight: 104.5 kg 105.8 kg 105.8 kg    Physical Exam:  General exam: sitting in recliner, awake, alert, no acute distress Respiratory system: Lungs clear bilaterally, normal respiratory effort, on room air. Cardiovascular system: RRR, no lower extremity edema, improved b/l UE edema Central nervous system: A&O x3. no gross focal neurologic deficits, normal speech Psychiatry: normal mood, congruent affect, judgement and insight appear normal  Labs   Data Reviewed: I have personally reviewed following labs and imaging studies  CBC: Recent Labs  Lab 07/13/21 0340 07/14/21 1833 07/15/21 0630 07/15/21 1612 07/16/21 1125 07/17/21 0238 07/17/21 0239 07/17/21 1720 07/18/21 0906 07/19/21 0336  WBC 8.9  --  8.8  --  7.0  --  10.6*  --  8.9  --   HGB 9.1*   < > 6.0*   < > 6.4* 8.4* 8.6* 8.2* 8.5* 7.7*  HCT 27.6*   < > 17.7*   < > 19.2* 25.2* 25.7* 24.4* 26.6* 23.7*  MCV 97.5  --  94.1  --  96.0  --  92.8  --  96.0  --   PLT 132*  --  147*  --  140*  --  149*  --  140*  --    < > = values in this interval not displayed.   Basic Metabolic Panel: Recent Labs  Lab 07/13/21 0340 07/13/21 1642 07/14/21 0225 07/14/21 1517 07/15/21 0630 07/15/21 1612 07/16/21 1125 07/16/21 1217 07/17/21 0238 07/17/21 1720 07/18/21 1500  NA 125*   < > 121*   < > 129* 128*  --   132* 131* 128* 126*  K 4.4  --  5.1   < > 4.6 4.6  --  3.6 4.0 4.0 3.8  CL 95*  --  91*   < > 98 97*  --  100 99 98 93*  CO2 25  --  24   < > 27 26  --  27 26 24 26   GLUCOSE 215*  --  270*   < > 197* 278*  --  112* 108* 170* 256*  BUN 23*  --  47*   < > 39* 28*  --  13 10 9 8   CREATININE 0.91  --  1.10   < > 0.85 0.83  --  0.71 0.89 0.82 0.79  CALCIUM 7.6*  --  7.2*   < > 7.6* 7.7*  --  7.5* 7.8* 7.6* 7.3*  MG  --   --   --   --   --   --  2.3  --   --   --   --   PHOS 3.2  --  3.3  --  3.9  --   --  4.3  --   --   --    < > = values in this interval not displayed.   GFR: Estimated Creatinine Clearance: 122.6 mL/min (by C-G formula based on SCr of 0.79 mg/dL). Liver Function Tests: Recent Labs  Lab 07/13/21 0340 07/14/21 0225 07/15/21 0630 07/16/21 1217  ALBUMIN <1.5* <1.5* <1.5* 1.6*   No results for input(s): LIPASE, AMYLASE in the last 168 hours. No results for input(s): AMMONIA in the last 168 hours. Coagulation Profile: No results for input(s): INR, PROTIME in the last 168 hours. Cardiac Enzymes: No results for input(s): CKTOTAL, CKMB, CKMBINDEX, TROPONINI in the last 168 hours. BNP (last 3 results) No results for input(s): PROBNP in the last 8760 hours. HbA1C: No results for input(s): HGBA1C in the last 72 hours. CBG: Recent Labs  Lab 07/18/21 1146 07/18/21 1707 07/18/21 2142 07/19/21 0615 07/19/21 1202  GLUCAP 170* 149* 189* 180* 170*   Lipid Profile: No results for input(s): CHOL, HDL, LDLCALC, TRIG, CHOLHDL, LDLDIRECT in the last 72 hours. Thyroid Function Tests: No results for input(s): TSH, T4TOTAL, FREET4, T3FREE, THYROIDAB in the last 72 hours. Anemia Panel: No results for input(s): VITAMINB12, FOLATE, FERRITIN, TIBC, IRON,  RETICCTPCT in the last 72 hours. Sepsis Labs: No results for input(s): PROCALCITON, LATICACIDVEN in the last 168 hours.  No results found for this or any previous visit (from the past 240 hour(s)).    Imaging Studies   No  results found.   Medications   Scheduled Meds:  sodium chloride   Intravenous Once   Chlorhexidine Gluconate Cloth  6 each Topical Daily   folic acid  1 mg Oral Daily   insulin aspart  0-15 Units Subcutaneous TID WC   insulin aspart  0-5 Units Subcutaneous QHS   insulin aspart  4 Units Subcutaneous TID WC   insulin glargine-yfgn  25 Units Subcutaneous QHS   linagliptin  5 mg Oral Daily   multivitamin  1 tablet Oral Daily   pantoprazole  40 mg Oral BID AC   polyethylene glycol  17 g Oral Daily   senna-docusate  1 tablet Oral QHS   sodium chloride flush  10-40 mL Intracatheter Q12H   thiamine  100 mg Oral Daily   urea  15 g Oral Daily   Continuous Infusions:   ceFAZolin (ANCEF) IV 2 g (07/19/21 0626)       LOS: 11 days    Time spent: 25 minutes with > 50% spent at bedside and in coordination of care     Ezekiel Slocumb, DO [facility]  07/19/2021, 4:17 PM      If 7PM-7AM, please contact night-coverage. How to contact the [facility] Attending or Consulting provider [place] or covering provider during after hours [place], for this patient?    Check the care team in [facility] and look for a) attending/consulting TR[facility] provider listed and b) the [facility] team listed Log into [email or web address] and use [place]'s universal password to access. If you do not have the password, please contact the hospital operator. Locate the [facility] provider you are looking for under [facility] and page to a number that you can be directly reached. If you still have difficulty reaching the provider, please page the [facility] (Director on Call) for the Hospitalists listed on amion for assistance.

## 2021-07-19 NOTE — Progress Notes (Signed)
Mobility Specialist: Progress Note   07/19/21 1539  Mobility  Activity Ambulated in hall  Level of Assistance Modified independent, requires aide device or extra time  Assistive Device Front wheel walker  Distance Ambulated (ft) 330 ft  Mobility Ambulated with assistance in hallway  Mobility Response Tolerated well  Mobility performed by Mobility specialist  $Mobility charge 1 Mobility   Pre-Mobility: 74 HR Post-Mobility: 79 HR  Pt to BR and then agreeable to ambulation, BM successful. Pt had no c/o during ambulation. Pt back to bed after walk with call bell and phone at his side.   [facility] Hideo Googe Mobility Specialist Mobility Specialist Phone: [phone]

## 2021-07-19 NOTE — Progress Notes (Signed)
[facility] Progress Note  Name: Joshua Cole MRN: [id number] DOB: 1960-11-16  Chief Complaint:  Hyponatremia  Subjective:  Patient reports that he is feeling fine today. He feels like he is urinating less than he should but otherwise states that he feels fine and is ready to go home. He does note that he thought the lights were flickering earlier when asked about confusion, but is otherwise at baseline mentation.  Review of systems:  ROS overall negative. Patient denies chest pain, shortness of breath, abdominal pain, abdominal distension, dizziness, fatigue, malaise, confusion.   Intake/Output Summary (Last 24 hours) at 07/19/2021 1110 Last data filed at 07/19/2021 0941 Gross per 24 hour  Intake 840 ml  Output 3175 ml  Net -2335 ml    Vitals:  Vitals:   07/18/21 2342 07/19/21 0329 07/19/21 0616 07/19/21 0750  BP: (!) 126/55 130/60    Pulse: 65 64    Resp: 18 18  16   Temp: 98.2 F (36.8 C) 99.5 F (37.5 C)  99.2 F (37.3 C)  TempSrc: Oral Oral  Oral  SpO2: 97% 95%    Weight:   105.8 kg   Height:         Physical Exam:  General -- oriented x3, pleasant and cooperative. HEENT -- Head is normocephalic. EOMI.  Neck -- supple; no bruits. Integument -- intact. No rash, erythema, or ecchymoses.  Chest -- good expansion. Lungs clear to auscultation. Cardiac -- RRR. No murmurs noted. No lower extremity edema Abdomen -- soft, nontender. No masses palpable. Bowel sounds present.   Medications reviewed   Labs:  BMP Latest Ref Rng & Units 07/18/2021 07/17/2021 07/17/2021  Glucose 70 - 99 mg/dL 256(H) 170(H) 108(H)  BUN 6 - 20 mg/dL 8 9 10   Creatinine 0.61 - 1.24 mg/dL 0.79 0.82 0.89  Sodium 135 - 145 mmol/L 126(L) 128(L) 131(L)  Potassium 3.5 - 5.1 mmol/L 3.8 4.0 4.0  Chloride 98 - 111 mmol/L 93(L) 98 99  CO2 22 - 32 mmol/L 26 24 26   Calcium 8.9 - 10.3 mg/dL 7.3(L) 7.6(L) 7.8(L)     Assessment/Plan:  Joshua Cole is a 60 y.o. male with a history  of gout, alcohol use  admitted for generalized weakness a found to have MSSA bacteremia with tricuspid valve endocarditis.   Hyponatremia Possibly related to alcohol use with low solute intake. Other causes such as hypothyroidism and adrenal insufficiency were unlikely (TSH low but T4 normal, BP and bicarb normal). Previously consulted for hyponatremia that corrected on it's own and nephrology originally signed off on 11/4.  Sodium has been continuing to downtrend and most recently is 126 (128 with hyperglycemia correction).  - Check blood and urine osmolality - Fluid restriction of 1518mL - Continue to monitor with BMP  MSSA bacteremia  Tricuspid valve endocarditis - Cardiology and ID following  - On Ancef  EtOH overuse (no signs of cirrhosis on imaging) - continue thiamine/folate  DM: New this hospitalization. Per primary team  Thrombocytopenia, likely secondary to alcohol use - stable at 140  Anemia - Hgb 8.5>7.7 - Management per primary team. On PPI BID and transfused.  Hypoalbuminemia: severely low at 1.6, nutrition with liver disease present.    Rise Patience, DO Family Medicine PGY2 07/19/2021 11:10 AM   Seen and examined independently.  Agree with note and exam as documented above by [name] and as noted here.  Not on diuretic at home. Eating ok.  Has a 1.5 liter fluid restriction.   General adult male in  bed in no acute distress HEENT normocephalic atraumatic extraocular movements intact sclera anicteric Neck supple trachea midline Lungs clear to auscultation bilaterally normal work of breathing at rest  Heart S1S2 no rub Abdomen soft nontender nondistended Extremities no pitting edema aside from left hand; moves left arm with right arm assistance - states baseline this hospitalization Psych normal mood and affect Neuro   # Hyponatremia  - initially Hyponatremia felt 2/2 beer potomania - Euvolemic to slight overload. Hyperglycemia as well - improves but  doesn't resolve with correction for glucose - may have improved before with urea packets.  Start urea 15 gram daily - obtain hepatic function panel  - no data prior to 06/2021 for comparison - can defer fluids for now and saline lock IV  # DM - control per primary team   # MSSA bacteremia and tricuspid valve endocarditis - abx per primary team   # EtOH abuse  - initially Hyponatremia felt 2/2 beer potomania - monitoring per primary team - on supplements  # Left arm weakness - states his new baseline this hospitalization - Per primary team  # Normocytic anemia - on thiamine-folate supplementation - nearing need for tranfusion  [name], MD 07/19/2021 12:29 PM

## 2021-07-20 LAB — IRON AND TIBC
Iron: 20 ug/dL — ABNORMAL LOW (ref 45–182)
Saturation Ratios: 7 % — ABNORMAL LOW (ref 17.9–39.5)
TIBC: 297 ug/dL (ref 250–450)
UIBC: 277 ug/dL

## 2021-07-20 LAB — BASIC METABOLIC PANEL
Anion gap: 8 (ref 5–15)
BUN: 12 mg/dL (ref 6–20)
CO2: 27 mmol/L (ref 22–32)
Calcium: 8 mg/dL — ABNORMAL LOW (ref 8.9–10.3)
Chloride: 98 mmol/L (ref 98–111)
Creatinine, Ser: 0.76 mg/dL (ref 0.61–1.24)
GFR, Estimated: >60 mL/min (ref >60)
Glucose, Bld: 114 mg/dL — ABNORMAL HIGH (ref 70–99)
Potassium: 3.9 mmol/L (ref 3.5–5.1)
Sodium: 133 mmol/L — ABNORMAL LOW (ref 135–145)

## 2021-07-20 LAB — HEMOGLOBIN AND HEMATOCRIT, BLOOD
HCT: 24 % — ABNORMAL LOW (ref 39.0–52.0)
Hemoglobin: 7.7 g/dL — ABNORMAL LOW (ref 13.0–17.0)

## 2021-07-20 LAB — GLUCOSE, CAPILLARY
Glucose-Capillary: 109 mg/dL — ABNORMAL HIGH (ref 70–99)
Glucose-Capillary: 105 mg/dL — ABNORMAL HIGH (ref 70–99)
Glucose-Capillary: 150 mg/dL — ABNORMAL HIGH (ref 70–99)
Glucose-Capillary: 156 mg/dL — ABNORMAL HIGH (ref 70–99)
Glucose-Capillary: 148 mg/dL — ABNORMAL HIGH (ref 70–99)

## 2021-07-20 LAB — MAGNESIUM: Magnesium: 2.2 mg/dL (ref 1.7–2.4)

## 2021-07-20 LAB — FERRITIN: Ferritin: 380 ng/mL — ABNORMAL HIGH (ref 24–336)

## 2021-07-20 MED ORDER — PANTOPRAZOLE SODIUM 40 MG PO TBEC: 40.0000 mg | DELAYED_RELEASE_TABLET | Freq: Two times a day (BID) | ORAL | 2 refills | Status: DC

## 2021-07-20 MED ORDER — FUROSEMIDE 20 MG PO TABS: 20.0000 mg | ORAL_TABLET | Freq: Every day | ORAL | Status: DC | PRN

## 2021-07-20 MED ORDER — FERROUS SULFATE 325 (65 FE) MG PO TABS
325.0000 mg | ORAL_TABLET | Freq: Every day | ORAL | Status: DC
  Administered 2021-07-20: 325 mg via ORAL
  Filled 2021-07-20: qty 1

## 2021-07-20 MED ORDER — HEPARIN SOD (PORK) LOCK FLUSH 100 UNIT/ML IV SOLN
250.0000 [IU] | INTRAVENOUS | Status: AC | PRN
  Administered 2021-07-20: 250 [IU]
  Filled 2021-07-20: qty 2.5

## 2021-07-20 MED ORDER — INSULIN GLARGINE 100 UNIT/ML SOLOSTAR PEN: 25.0000 [IU] | PEN_INJECTOR | Freq: Every day | SUBCUTANEOUS | 11 refills | Status: DC

## 2021-07-20 MED ORDER — PEN NEEDLES 32G X 6 MM MISC: 1.0000 | Freq: Three times a day (TID) | 1 refills | Status: DC

## 2021-07-20 MED ORDER — UREA 15 G PO PACK
15.0000 g | PACK | ORAL | Status: DC
  Filled 2021-07-20: qty 1

## 2021-07-20 MED ORDER — LINAGLIPTIN 5 MG PO TABS: 5.0000 mg | ORAL_TABLET | Freq: Every day | ORAL | 1 refills | Status: DC

## 2021-07-20 MED ORDER — FERROUS SULFATE 325 (65 FE) MG PO TABS: 325.0000 mg | ORAL_TABLET | Freq: Every day | ORAL | 1 refills | Status: DC

## 2021-07-20 MED ORDER — INSULIN ASPART 100 UNIT/ML FLEXPEN: 0.0000 [IU] | PEN_INJECTOR | Freq: Three times a day (TID) | SUBCUTANEOUS | 11 refills | Status: DC

## 2021-07-20 MED ORDER — UREA 15 G PO PACK: 15.0000 g | PACK | ORAL | 0 refills | Status: DC

## 2021-07-20 MED ORDER — BLOOD GLUCOSE MONITOR KIT: PACK | 0 refills | Status: DC

## 2021-07-20 NOTE — Progress Notes (Signed)
Occupational Therapy Treatment Patient Details Name: Joshua Cole MRN: [id number] DOB: 09/22/1960 Today's Date: 07/20/2021   History of present illness Pt is a 60 y.o. male admitted 07/03/21 with c/o weakness, joint pain, confusion, nausea/vomiting, fever; of note, pt recently received flu shot and COVID booster on 10/14. Workup for hyponatremia, acute metabolic encephalopathy of unclear etiology. Brain MRI negative for acute injury. Blood cultures (+) staph aureus. Pelvic MRI 10/24 showed bilateral hip joint effusions; edema consistent with myositis. 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 Course complicated by development of heart block. Transfer to [facility] on 10/27 for cardiothoracic sx consult. Chest CTA negative for PE, concern for multiple ill-defined lesions. PMH includes gout, ETOH use.   OT comments  Pt progressing towards acute OT goals. Completed bed mobility, toilet transfer, pericare, and 1 grooming task standing at sink at supervision level. Prior to standing, pt uses right hand to advance LUE onto rw d/t pain with shoulder ROM at about 30*. Pt also completed PROM/AAROM L shoulder flexion/abduction once seated in recliner.  D/c plan remains appropriate.    Recommendations for follow up therapy are one component of a multi-disciplinary discharge planning process, led by the attending physician.  Recommendations may be updated based on patient status, additional functional criteria and insurance authorization.    Follow Up Recommendations  Home health OT    Assistance Recommended at Discharge Frequent or constant Supervision/Assistance  Equipment Recommendations  Tub/shower seat    Recommendations for Other Services      Precautions / Restrictions Precautions Precautions: Fall Restrictions Weight Bearing Restrictions: No       Mobility Bed Mobility Overal bed mobility: Needs Assistance Bed Mobility: Supine to Sit     Supine to sit: Supervision;HOB  elevated          Transfers Overall transfer level: Needs assistance Equipment used: Rolling walker (2 wheels) Transfers: Sit to/from Stand Sit to Stand: Supervision           General transfer comment: pt uses right hand to advance LUE onto rw d/t pain with shoulder ROM     Balance Overall balance assessment: Needs assistance Sitting-balance support: No upper extremity supported;Feet supported Sitting balance-Leahy Scale: Good Sitting balance - Comments: Pt able to sit EOB with feet supported; c/o dizziness and "seeing colors"   Standing balance support: No upper extremity supported Standing balance-Leahy Scale: Fair                             ADL either performed or assessed with clinical judgement   ADL Overall ADL's : Needs assistance/impaired Eating/Feeding: Set up;Sitting Eating/Feeding Details (indicate cue type and reason): sat EOB to take sips of water with meds given by nurse Grooming: Wash/dry hands;Min guard;Standing                   Toilet Transfer: Min guard;Ambulation;Rolling walker (2 wheels);Grab bars   Toileting- Clothing Manipulation and Hygiene: Set up;Sitting/lateral lean       Functional mobility during ADLs: Min guard;Rolling walker (2 wheels) General ADL Comments: Pt completed in room functional mobility, up to bathroom and sink during session. Discussed L shoulder once pt seated in recliner and educated on ROM exercises to promote joint mobility within tolerable range,    Extremity/Trunk Assessment Upper Extremity Assessment Upper Extremity Assessment: LUE deficits/detail LUE Deficits / Details: painful shoulder at about 30* AROM/45* PROM. Uses right arm to advance LUE onto rw in seated position prior to standing  LUE: Unable to fully assess due to pain;Shoulder pain with ROM LUE Coordination: decreased gross motor   Lower Extremity Assessment Lower Extremity Assessment: Defer to PT evaluation        Vision        Perception     Praxis      Cognition Arousal/Alertness: Awake/alert Behavior During Therapy: WFL for tasks assessed/performed Overall Cognitive Status: Within Functional Limits for tasks assessed                                            Exercises Exercises: Shoulder Shoulder Exercises Shoulder Flexion: AAROM;Self ROM;Left;10 reps;Seated Shoulder ABduction: AAROM;10 reps;Seated;Left   Shoulder Instructions       General Comments      Pertinent Vitals/ Pain       Pain Assessment: Faces Faces Pain Scale: Hurts even more Pain Location: Left shoulder at about 30* flexion/abduction Pain Descriptors / Indicators: Grimacing;Guarding Pain Intervention(s): Limited activity within patient's tolerance;Monitored during session;Repositioned  Home Living                                          Prior Functioning/Environment              Frequency  Min 2X/week        Progress Toward Goals  OT Goals(current goals can now be found in the care plan section)  Progress towards OT goals: Progressing toward goals  Acute Rehab OT Goals Patient Stated Goal: to finally get home OT Goal Formulation: With patient Time For Goal Achievement: 07/26/21 Potential to Achieve Goals: Good ADL Goals Pt Will Perform Grooming: with supervision;standing Pt Will Perform Upper Body Dressing: with set-up;sitting Pt Will Perform Lower Body Dressing: with min assist;with adaptive equipment;sit to/from stand Pt Will Transfer to Toilet: with supervision;ambulating;bedside commode Pt Will Perform Toileting - Clothing Manipulation and hygiene: with supervision;sit to/from stand Pt/caregiver will Perform Home Exercise Program: Left upper extremity;Independently;Increased ROM Additional ADL Goal #1: Pt will perform bed mobility with min assist in preparation for ADL.  Plan Discharge plan remains appropriate;Frequency remains appropriate    Co-evaluation                  AM-PAC OT "6 Clicks" Daily Activity     Outcome Measure   Help from another person eating meals?: None Help from another person taking care of personal grooming?: A Little Help from another person toileting, which includes using toliet, bedpan, or urinal?: A Lot Help from another person bathing (including washing, rinsing, drying)?: A Little Help from another person to put on and taking off regular upper body clothing?: A Little Help from another person to put on and taking off regular lower body clothing?: A Lot 6 Click Score: 17    End of Session Equipment Utilized During Treatment: Rolling walker (2 wheels)  OT Visit Diagnosis: Muscle weakness (generalized) (M62.81);Pain;Unsteadiness on feet (R26.81) Pain - Right/Left: Left Pain - part of body: Shoulder   Activity Tolerance Patient tolerated treatment well;Other (comment) (UB ADLs impacted by L shoulder pain at 45* flexion/abduction)   Patient Left with call bell/phone within reach;in chair   Nurse Communication          Time: 5284-1324 OT Time Calculation (min): 22 min  Charges: OT General Charges $OT Visit: 1 Visit  OT Treatments $Self Care/Home Management : 8-22 mins  Tyrone Schimke, OT Acute Rehabilitation Services Pager: [phone] Office: [phone]   Joshua Cole 07/20/2021, 12:24 PM

## 2021-07-20 NOTE — Progress Notes (Signed)
Mobility Specialist: Progress Note   07/20/21 1539  Mobility  Activity Ambulated in hall  Level of Assistance Standby assist, set-up cues, supervision of patient - no hands on  Assistive Device Front wheel walker  Distance Ambulated (ft) 380 ft  Mobility Ambulated with assistance in hallway  Mobility Response Tolerated well  Mobility performed by Mobility specialist  $Mobility charge 1 Mobility   Pre-Mobility: 77 HR, 122/58 BP, 98% SpO2 Post-Mobility: 80 HR  Pt mod I to sit EOB and independent to stand. Pt had no c/o during ambulation. Pt sitting EOB after walk and is getting ready for discharge with call bell at his side.   [facility] Joshua Cole Mobility Specialist Mobility Specialist Phone: [phone]

## 2021-07-20 NOTE — Progress Notes (Signed)
Physical Therapy Treatment Patient Details Name: Joshua Cole MRN: [id number] DOB: March 02, 1961 Today's Date: 07/20/2021   History of Present Illness Pt is a 60 y.o. male admitted 07/03/21 with c/o weakness, joint pain, confusion, nausea/vomiting, fever; of note, pt recently received flu shot and COVID booster on 10/14. Workup for hyponatremia, acute metabolic encephalopathy of unclear etiology. Brain MRI negative for acute injury. Blood cultures (+) staph aureus. Pelvic MRI 10/24 showed bilateral hip joint effusions; edema consistent with myositis. 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 Course complicated by development of heart block. Transfer to [facility] on 10/27 for cardiothoracic sx consult. Chest CTA negative for PE, concern for multiple ill-defined lesions. PMH includes gout, ETOH use.    PT Comments    Pt is progressing well with mobility. Today's session focused on gait and stair training. Pt was able to ambulate with supervision and traverse stairs with min guard. Recommend HH PT after discharge to continue to work on functional mobility, strength, and activity tolerance for further independence. Will continue to follow acutely to continue to progress towards short-term acute rehab goals.  Recommendations for follow up therapy are one component of a multi-disciplinary discharge planning process, led by the attending physician.  Recommendations may be updated based on patient status, additional functional criteria and insurance authorization.  Follow Up Recommendations  Home health PT     Assistance Recommended at Discharge Intermittent Supervision/Assistance  Equipment Recommendations  None recommended by PT    Recommendations for Other Services       Precautions / Restrictions Precautions Precautions: Fall Restrictions Weight Bearing Restrictions: No     Mobility  Bed Mobility               General bed mobility comments: Pt received in recliner     Transfers Overall transfer level: Needs assistance Equipment used: Rolling walker (2 wheels) Transfers: Sit to/from Stand Sit to Stand: Supervision           General transfer comment: Pt stood with increased time; good hand placement with RW    Ambulation/Gait Ambulation/Gait assistance: Supervision Gait Distance (Feet): 390 Feet Assistive device: Rolling walker (2 wheels) Gait Pattern/deviations: Step-through pattern;Decreased stride length Gait velocity: decreased     General Gait Details: Supervision for safety. Pt declined walking further due to fatigue from stair training.   Stairs Stairs: Yes Stairs assistance: Min guard Stair Management: One rail Right;Forwards;Backwards;Step to pattern Number of Stairs: 2 General stair comments: Min gaurd for safety. Verbal cues for sequence. Step up forward and stepped down backward. Pt reported the stairs were easier today than they were last session.   Wheelchair Mobility    Modified Rankin (Stroke Patients Only)       Balance Overall balance assessment: Needs assistance Sitting-balance support: Feet supported;No upper extremity supported Sitting balance-Leahy Scale: Good Sitting balance - Comments: Pt able to sit EOB with feet supported   Standing balance support: No upper extremity supported Standing balance-Leahy Scale: Fair Standing balance comment: Pt able to static stand with UE support; reliant on RW during ambulation                            Cognition Arousal/Alertness: Awake/alert Behavior During Therapy: WFL for tasks assessed/performed Overall Cognitive Status: Within Functional Limits for tasks assessed  Exercises     General Comments General comments (skin integrity, edema, etc.): No signs or symptoms or dizziness or orthostatic hypotension      Pertinent Vitals/Pain Pain Assessment: No/denies pain     Home Living                           Prior Function            PT Goals (current goals can now be found in the care plan section) Acute Rehab PT Goals Patient Stated Goal: go home PT Goal Formulation: With patient Time For Goal Achievement: 07/25/21 Potential to Achieve Goals: Good Progress towards PT goals: Progressing toward goals    Frequency    Min 3X/week      PT Plan Current plan remains appropriate    Co-evaluation              AM-PAC PT "6 Clicks" Mobility   Outcome Measure    Help needed moving from lying on your back to sitting on the side of a flat bed without using bedrails?: None Help needed moving to and from a bed to a chair (including a wheelchair)?: A Little Help needed standing up from a chair using your arms (e.g., wheelchair or bedside chair)?: A Little Help needed to walk in hospital room?: A Little Help needed climbing 3-5 steps with a railing? : A Little 6 Click Score: 16    End of Session Equipment Utilized During Treatment: Gait belt Activity Tolerance: Patient tolerated treatment well Patient left: in bed;with nursing/sitter in room Nurse Communication: Mobility status PT Visit Diagnosis: Unsteadiness on feet (R26.81);Other abnormalities of gait and mobility (R26.89);Pain     Time: 7078-6754 PT Time Calculation (min) (ACUTE ONLY): 16 min  Charges:  $Gait Training: 8-22 mins                    Brandon Melnick, SPT  Victoriana Aziz 07/20/2021, 1:26 PM

## 2021-07-20 NOTE — TOC Transition Note (Signed)
Transition of Care (TOC) - CM/SW Discharge Note [name] RN, BSN Transitions of Care Unit 4E- RN Case Manager See Treatment Team for direct phone #    Patient Details  Name: Joshua Cole MRN: [id number] Date of Birth: May 18, 1961  Transition of [facility] CM/SW Contact:  [name], RN Phone Number: 07/20/2021, 1:45 PM   Clinical Narrative:    Pt stable for transition home today, have spoken to both [name] with home infusion and [name] with [facility] and both agencies are prepped for pt's discharge. Orders have been placed for HHRN/PT/OT and OPAT has been signed.  Per [name]- education has been  completed for home IV abx needs with pt and his daughter- they will plan to deliver home abx to the home today for tonights 10 pm dose. Pt will need to receive his 2pm dose here at hospital prior to discharge- Bedside RN aware of plan.      Final next level of care: [place] Barriers to Discharge: Barriers Resolved   Patient Goals and CMS Choice Patient states their goals for this hospitalization and ongoing recovery are:: return home with fiance CMS Medicare.gov Compare Post Acute Care list provided to:: Patient Choice offered to / list presented to : Patient  Discharge Placement                 Home w/ [facility]      Discharge Plan and Services In-house Referral: Clinical Social Work Discharge Planning Services: CM Consult Post Acute Care Choice: Home Health          DME Arranged: N/A DME Agency: NA       HH Arranged: RN, PT, OT [place] Agency: [facility]) Date HH Agency Contacted: 07/20/21 Time HH Agency Contacted: 1000 Representative spoke with at [place]: [name] and [name]  Social Determinants of Health ([place]) Interventions     Readmission Risk Interventions No flowsheet data found.

## 2021-07-20 NOTE — Progress Notes (Signed)
Patient has received 2pm dose of Cefazolin as requested by RN case manager prior to discharge home. Patient seen by IV team and PICC capped for home use. Patient was given discharge instructions, prescriptions, follow up appointments, 14day trial for medication. All questions answered. Tele was discontinued. Will be transported to exit via wheel chair and nursing staff. Sujata Maines, Bettina Gavia RN

## 2021-07-20 NOTE — Discharge Summary (Signed)
Physician Discharge Summary  Joshua Cole ZOX:[id number] DOB: March 18, 1961 DOA: 07/08/2021  PCP: Pcp, No  Admit date: 07/08/2021 Discharge date: 07/20/2021  Admitted From: home Disposition:  home  Recommendations for Outpatient Follow-up:  Follow up with PCP in 1-2 weeks Please obtain BMP/CBC in one week Please follow up with cardiology as scheduled with Dr. Quentin Ore (cardiac EP) on 08/25/21 Follow up with infectious disease in 4-5 weeks Follow up on glycemic control and insulin use  Home Health: PT, OT, RN  Equipment/Devices:    Discharge Condition: Stable  CODE STATUS: Full  Diet recommendation: Heart Healthy / [name] / 1500 mL/day fluid restriction  Discharge Diagnoses: Active Problems:   Acute metabolic encephalopathy   Generalized weakness   Bacteremia   Acute bacterial endocarditis   Heart block   MSSA bacteremia   Acute blood loss anemia   Heart block AV complete (HCC)   Abnormal LFTs   Duodenal ulcer   History of colonic polyps    Summary of HPI and Hospital Course:  60 year old male patient with past medical history of gout, alcohol use presented with generalized weakness, bilateral hip pain left shoulder pain, who was initially admitted to [facility].  Patient was intermittently confused in the ED, blood work showed MSSA bacteremia.  CT angiogram negative for PE, then TEE was positive for tricuspid valve vegetation.  During hospital stay at [facility], patient developed third-degree AV block on 10/26, and transferred to [facility] for further management.   CT surgery evaluated for possible TR replacement.  CT surgery, recommended conservative management only, and no surgery.    Remains on Ancef.   For the bilateral hip pain, MRI showed small to moderate right-sided joint effusion.  Patient has mild bilateral hip pain right>left.  This resolved during admission and pt denies hip pain.   Patient was also found to have significant elevation of blood glucose, A1c 7.8.  Patient  was started on insulin.   Blood work shows patient has euvolemic hyponatremia likely from alcohol abuse and beer potomania.  Nephrology was consulted and started patient on Lasix.  Patient has no history of cirrhosis, acute ultrasound showed fatty liver but no signs of cirrhosis.  Sodium improved after diuresis and fluid restriction.  Near end of admission, hyponatremia recurrent and nephrology started urea with Na subsequently improved.    MSSA bacteremia with tricuspid valve endocarditis --Cardiology and infectious disease consulted --Continue Ancef - end date 08/16/2021 --Repeat echo in 6 to 8 weeks --Follow up in ID clinic in 4-5 weeks    Acute blood loss anemia due to esophageal and duodenal ulcers / Abdominal pain / Gastritis -  11/3: Received 2 unit PRBCs overnight with improvement of hemoglobin from 5.2-6.0>>7.3 11/4: Hemoglobin 6.4, given 2 units pRBC's Underwent EGD and colonoscopy morning of 11/4.   EGD showed nonbleeding esophageal ulcer and multiple duodenal ulcers in addition to gastritis.  Ulcers thought to be source of patient's blood loss although not actively bleeding at the time of evaluation. Hbg stable after transfusions. --Continue PPI twice daily   Complete heart block - PPM initially planned with possible tricuspid valve replacement.   Cardiology and EP were consulted.   Valve replacement not currently planned.   Cardiac rhythm stable junctional escape. --Avoid AV nodal blockers --Follow up with Dr. Fletcher Anon, primary cardiology --Dr. Quentin Ore EP - follow up scheduled for 08/25/21   Hyponatremia, euvolemic - Nephrology consulted Seems likely beer potomania / low solute consumption. --Fluid restriction --Nephrology started on urea with improvement    Hypoalbuminemia -  UA without proteinuria, right upper quadrant ultrasound showed no signs of cirrhosis, but given history of alcohol abuse could be related to liver dysfunction, malnutrition related alcohol use vs  negative acute phase reactant in setting of infection.      Thrombocytopenia -most likely related to alcohol abuse, possibly liver disease.  Platelet count improved. Repeat CBC in follow up.    Insulin-dependent type 2 diabetes -uncontrolled.  Started on insulin and Tradjenta for underlying insulin resistance. He is new to using insulin.  Will need close outpatient follow up.    Alcohol abuse - no signs or symptoms of withdrawal.  Abdominal imaging showed hepatic steatosis, no signs of cirrhosis at this point.  INR within normal limits. Patient counseled on importance of alcohol cessation.   Right hip pain - MRI of pelvis showed moderate right hip joint effusion with right-sided periprostatic fluid collection tracking posteriorly along the greater trochanter.  No marrow signal changes to suggest osteomyelitis.  ID following as above.  ID discussed aspirating joint with IR, but it was felt to be low yield and more risk than benefit, unlikely to change management.   Symmetric bilateral intramuscular edema involving the gluteal musculature and adductor muscles, consistent with non-specific myositis. No evidence of pyomyositis.   Patient reported resolution of hip pain several days prior to discharge.     Obesity: Body mass index is 33.47 kg/m.  Complicates overall care and prognosis.  Recommend lifestyle modifications including physical activity and diet for weight loss and overall long-term health.     Discharge Instructions   Discharge Instructions     [facility] pharmacist to adjust dose for Vancomycin, Aminoglycosides and other anti-infective therapies as requested by physician.   Complete by: As directed    [facility] to provide Cath Flo 66m   Complete by: As directed    Administer for PICC line occlusion and as ordered by physician for other access device issues.   Anaphylaxis Kit: Provided to treat any anaphylactic reaction to the medication being  provided to the patient if First Dose or when requested by physician   Complete by: As directed    Epinephrine 18mml vial / amp: Administer 0.79m679m0.79ml42mubcutaneously once for moderate to severe anaphylaxis, nurse to call physician and pharmacy when reaction occurs and call 911 if needed for immediate care   Diphenhydramine 50mg58mIV vial: Administer 25-50mg 79mM PRN for first dose reaction, rash, itching, mild reaction, nurse to call physician and pharmacy when reaction occurs   Sodium Chloride 0.9% NS 500ml I86mdminister if needed for hypovolemic blood pressure drop or as ordered by physician after call to physician with anaphylactic reaction   Call MD for:  extreme fatigue   Complete by: As directed    Call MD for:  persistant dizziness or light-headedness   Complete by: As directed    Call MD for:  persistant nausea and vomiting   Complete by: As directed    Call MD for:  severe uncontrolled pain   Complete by: As directed    Call MD for:  temperature >100.4   Complete by: As directed    Change dressing on IV access line weekly and PRN   Complete by: As directed    Diet - low sodium heart healthy   Complete by: As directed    1500 cc/day fluid restriction Bland foods - avoid citrus, acidic foods, spicy foods, caffeine and carbonation.   Discharge instructions   Complete by: As directed  Continue IV antibiotics with assistance of home health nursing. Follow up with Infectious Disease in clinic in 4-5 weeks.  Please have Primary Care re-check Labs within one week to follow up on your sodium level. Nephrology started you on Urea to help prevent sodium from dropping low, please take as prescribed. Please avoid heavy consumption of beer or other alcoholic beverages, as this can lead to low sodium levels.   For ulcers - follow a bland diet. Avoid acidic, spicy foods, caffeine.  Diet instructions included in discharge packet. I started an oral iron supplement to help your body  make red blood cells faster. Please have Primary Care recheck labs (Hemoglobin) in about one week.  Cardiology - follow up appt with Dr. Quentin Ore (cardiac EP) on 08/25/21   Discharge wound care:   Complete by: As directed    Keep areas clean and dry.  Cleanse daily and as needed, pat dry. See you Primary Care doctor if wounds are worsening, become more painful or have redness, swelling or draining cloudy fluid.   Flush IV access with Sodium Chloride 0.9% and Heparin 10 units/ml or 100 units/ml   Complete by: As directed    Home infusion instructions - [facility]   Complete by: As directed    Instructions: Flush IV access with Sodium Chloride 0.9% and Heparin 10units/ml or 100units/ml   Change dressing on IV access line: Weekly and PRN   Instructions Cath Flo 77m: Administer for PICC Line occlusion and as ordered by physician for other access device   [facility] pharmacist to adjust dose for: Vancomycin, Aminoglycosides and other anti-infective therapies as requested by physician   Increase activity slowly   Complete by: As directed    Method of administration may be changed at the discretion of home infusion pharmacist based upon assessment of the patient and/or caregiver's ability to self-administer the medication ordered   Complete by: As directed       Allergies as of 07/20/2021   No Known Allergies      Medication List     STOP taking these medications    ceFAZolin 2-4 GM/100ML-% IVPB Commonly known as: ANCEF   LORazepam 2 MG/ML injection Commonly known as: ATIVAN   morphine 2 MG/ML injection       TAKE these medications    acetaminophen 325 MG tablet Commonly known as: TYLENOL Take 2 tablets (650 mg total) by mouth every 6 (six) hours as needed for mild pain or fever.   ceFAZolin  IVPB Commonly known as: ANCEF Inject 2 g into the vein every 8 (eight) hours. Indication:  MSSA endocarditis First Dose: Yes Last Day of Therapy:   08/16/21 Labs - Once weekly:  CBC/D and BMP, Labs - Every other week:  ESR and CRP Method of administration: IV Push Method of administration may be changed at the discretion of home infusion pharmacist based upon assessment of the patient and/or caregiver's ability to self-administer the medication ordered.   ferrous sulfate 325 (65 FE) MG tablet Take 1 tablet (325 mg total) by mouth daily with breakfast.   folic acid 1 MG tablet Commonly known as: FOLVITE Take 1 tablet (1 mg total) by mouth daily.   furosemide 20 MG tablet Commonly known as: LASIX Take 1 tablet (20 mg total) by mouth daily as needed (swelling or wt gain). What changed:  when to take this reasons to take this   insulin aspart 100 UNIT/ML injection Commonly known as: novoLOG Inject 0-5 Units into the  skin at bedtime.   insulin aspart 100 UNIT/ML injection Commonly known as: novoLOG Inject 0-20 Units into the skin 3 (three) times daily with meals.   insulin aspart 100 UNIT/ML injection Commonly known as: novoLOG Inject 6 Units into the skin 3 (three) times daily with meals.   insulin glargine-yfgn 100 UNIT/ML injection Commonly known as: SEMGLEE Inject 0.23 mLs (23 Units total) into the skin at bedtime.   linagliptin 5 MG Tabs tablet Commonly known as: TRADJENTA Take 1 tablet (5 mg total) by mouth daily. Start taking on: July 21, 2021   multivitamin with minerals Tabs tablet Take 1 tablet by mouth daily.   pantoprazole 40 MG tablet Commonly known as: PROTONIX Take 1 tablet (40 mg total) by mouth 2 (two) times daily before a meal. What changed: when to take this   thiamine 100 MG tablet Take 1 tablet (100 mg total) by mouth daily.   urea 15 g Pack oral packet Commonly known as: URE-NA Take 15 g by mouth every Tuesday, Thursday, and Saturday at 6 PM.               Discharge Care Instructions  (From admission, onward)           Start     Ordered   07/20/21 0000  Discharge wound  care:       Comments: Keep areas clean and dry.  Cleanse daily and as needed, pat dry. See you Primary Care doctor if wounds are worsening, become more painful or have redness, swelling or draining cloudy fluid.   07/20/21 1320   07/16/21 0000  Change dressing on IV access line weekly and PRN  (Home infusion instructions - [facility] )        07/16/21 [place] Follow up.   Why: for home health services Contact information: [place] [address] [phone]                No Known Allergies   If you experience worsening of your admission symptoms, develop shortness of breath, life threatening emergency, suicidal or homicidal thoughts you must seek medical attention immediately by calling 911 or calling your MD immediately  if symptoms less severe.    Please note   You were cared for by a hospitalist during your hospital stay. If you have any questions about your discharge medications or the care you received while you were in the hospital after you are discharged, you can call the unit and asked to speak with the hospitalist on call if the hospitalist that took care of you is not available. Once you are discharged, your primary care physician will handle any further medical issues. Please note that NO REFILLS for any discharge medications will be authorized once you are discharged, as it is imperative that you return to your primary care physician (or establish a relationship with a primary care physician if you do not have one) for your aftercare needs so that they can reassess your need for medications and monitor your lab values.   Consultations: Cardiology EP Gastroenterology Nephrology  Infectious Disease   Procedures/Studies: DG Orthopantogram  Result Date: 07/09/2021 CLINICAL DATA:  Pre cardiac surgery. EXAM: ORTHOPANTOGRAM/PANORAMIC COMPARISON:  None.  FINDINGS: No fracture or lytic destruction is noted. Extensive dental work is noted. Several left posterior mandibular molars are absent. IMPRESSION: No fracture or  lytic destruction is noted. Electronically Signed   By: Marijo Conception M.D.   On: 07/09/2021 15:02   CT Angio Chest Pulmonary Embolism (PE) W or WO Contrast  Result Date: 07/05/2021 CLINICAL DATA:  Positive D-dimer level. EXAM: CT ANGIOGRAPHY CHEST WITH CONTRAST TECHNIQUE: Multidetector CT imaging of the chest was performed using the standard protocol during bolus administration of intravenous contrast. Multiplanar CT image reconstructions and MIPs were obtained to evaluate the vascular anatomy. CONTRAST:  61m OMNIPAQUE IOHEXOL 350 MG/ML SOLN COMPARISON:  None. FINDINGS: Cardiovascular: Satisfactory opacification of the pulmonary arteries to the segmental level. No evidence of pulmonary embolism. Normal heart size. No pericardial effusion. Atherosclerosis of thoracic aorta is noted without aneurysm formation. Mediastinum/Nodes: No enlarged mediastinal, hilar, or axillary lymph nodes. Thyroid gland, trachea, and esophagus demonstrate no significant findings. Lungs/Pleura: No pneumothorax is noted. Mild bibasilar subsegmental atelectasis is noted. Multiple ill-defined nodular opacities are noted throughout both lungs with the largest measuring 13 mm in the right upper lobe. This is concerning for metastatic disease or possibly multifocal infection. Upper Abdomen: No acute abnormality. Musculoskeletal: No chest wall abnormality. No acute or significant osseous findings. Review of the MIP images confirms the above findings. IMPRESSION: No definite evidence of pulmonary embolus is noted. Multiple ill-defined nodular opacities are noted throughout both lungs, the largest measuring 13 mm in the right upper lobe. This is concerning for diffuse metastatic disease or possibly multifocal pneumonia. Clinical correlation and short-term follow-up CT scan may be  performed. Mild bibasilar subsegmental atelectasis is noted. Aortic Atherosclerosis (ICD10-I70.0). Electronically Signed   By: JMarijo ConceptionM.D.   On: 07/05/2021 11:44   MR BRAIN W WO CONTRAST  Result Date: 07/03/2021 CLINICAL DATA:  Mental status change, unknown cause EXAM: MRI HEAD WITHOUT AND WITH CONTRAST TECHNIQUE: Multiplanar, multiecho pulse sequences of the brain and surrounding structures were obtained without and with intravenous contrast. CONTRAST:  160mGADAVIST GADOBUTROL 1 MMOL/ML IV SOLN COMPARISON:  None. FINDINGS: Brain: No acute infarction, hemorrhage, hydrocephalus, extra-axial collection, or mass lesion. The ventricles and sulci are within normal limits for age. No abnormal enhancement. No foci of hemosiderin deposition to suggest remote hemorrhage. Vascular: Normal flow voids. Skull and upper cervical spine: Normal marrow signal. Sinuses/Orbits: Small mucous retention cyst in the left maxillary sinus. Otherwise negative. Other: Trace fluid in right mastoid air cells. IMPRESSION: No acute intracranial process. Electronically Signed   By: AlMerilyn Baba.D.   On: 07/03/2021 22:45   MR PELVIS WO CONTRAST  Result Date: 07/05/2021 CLINICAL DATA:  Hip replacement, infection suspected EXAM: MRI PELVIS WITHOUT CONTRAST TECHNIQUE: Multiplanar multisequence MR imaging of the pelvis was performed. No intravenous contrast was administered. COMPARISON:  None. FINDINGS: Urinary Tract:  No abnormality visualized. Bowel:  Unremarkable visualized pelvic bowel loops. Vascular/Lymphatic: No pathologically enlarged lymph nodes. No significant vascular abnormality seen. Reproductive:  No mass or other significant abnormality Other:  None. Musculoskeletal: There are bilateral hip arthroplasties with associated susceptibility artifact. There are moderate right and trace left joint effusions. There is no visible marrow signal abnormality. Lower lumbar spine degenerative disc disease, partially visualized.  There is intramuscular edema bilaterally involving the gluteus musculature and abductor muscles. 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 Moderate right and trace left joint effusions, with right-sided periprostatic fluid collection tracking posteriorly along the greater trochanter. No marrow signal changes to suggest osteomyelitis. If there is concern for infected hip joint, aspiration should be considered. Symmetric bilateral intramuscular edema involving the gluteal musculature and adductor muscles,  consistent with non-specific myositis. No evidence of pyomyositis. Electronically Signed   By: Maurine Simmering M.D.   On: 07/05/2021 16:00   MR Shoulder Left W Wo Contrast  Result Date: 07/14/2021 CLINICAL DATA:  Left shoulder pain EXAM: MRI OF THE LEFT SHOULDER WITHOUT AND WITH CONTRAST TECHNIQUE: Multiplanar, multisequence MR imaging of the left shoulder was performed before and after the administration of intravenous contrast. CONTRAST:  39m GADAVIST GADOBUTROL 1 MMOL/ML IV SOLN COMPARISON:  Left shoulder radiograph 07/12/2021 FINDINGS: Rotator cuff: There is a full-thickness, near full width tear of the supraspinatus tendon at the footprint (coronal T2 image 12). There is high-grade articular sided tearing of the anterior fibers of the infraspinatus tendon at the footprint (sagittal T2 image 4-6). Mild tendinosis of the cephalad fibers of the subscapularis tendon. The teres minor is intact. Muscles: Intramuscular edema within the infraspinatus and deltoid muscles. No significant muscle atrophy. Biceps Long Head: Intact intra and extra-articular long head biceps tendon. Acromioclavicular Joint: Moderate-severe arthropathy of the acromioclavicular joint. There is distension of the subacromial-subdeltoid bursa with thick peripheral enhancement, short axis measuring up to 0.9 cm. (Coronal T1 post-contrast image 12, sagittal post image 5). Glenohumeral Joint: Trace glenohumeral  joint fluid. Mild chondrosis. Labrum: Degenerative superior labral tearing extending anteriorly and posteriorly through the biceps labral anchor. Bones: No fracture or dislocation. No aggressive osseous lesion. No convincing marrow signal changes to suggest osteomyelitis. There is low T1 signal within the scapula, acromion, clavicle, nonspecific. Other: No fluid collection or hematoma. IMPRESSION: Full-thickness, near full width tear of the supraspinatus tendon at the footprint. High-grade articular sided tearing of the anterior fibers of the infraspinatus tendon at the footprint. Mild subscapularis tendinosis of the cephalad fibers. No significant muscle atrophy. Distension of the subacromial-subdeltoid bursa with a peripheral enhancement, short axis measuring up to 0.9 cm. While this could be related to the patient's rotator cuff tear, given adjacent reactive edema in the deltoid and clinical concern for infection, bursa aspiration should be considered. Minimal there is minimal glenohumeral joint fluid and without adjacent marrow signal abnormality to suggest osteomyelitis. Low T1 signal within the scapula, acromion and clavicle is nonspecific, possibly related to the patient's anemia. Degenerative superior labral tearing anteriorly and posteriorly through the biceps labral anchor. Moderate-severe AC joint arthropathy. Electronically Signed   By: JMaurine SimmeringM.D.   On: 07/14/2021 16:59   DG Chest Port 1 View  Result Date: 07/05/2021 CLINICAL DATA:  Weakness and shortness of breath EXAM: PORTABLE CHEST 1 VIEW COMPARISON:  Radiograph 07/03/2021 FINDINGS: Unchanged cardiomediastinal silhouette. Nodular opacities in the lungs are better seen on recent chest CT. Bibasilar atelectasis. No large effusion. No pneumothorax. No acute osseous abnormality. IMPRESSION: Nodular opacities in the lungs are better seen on recent chest CT. Bibasilar atelectasis. Electronically Signed   By: JMaurine SimmeringM.D.   On: 07/05/2021  13:45   DG Chest Portable 1 View  Result Date: 07/03/2021 CLINICAL DATA:  sob/cough EXAM: PORTABLE CHEST 1 VIEW COMPARISON:  None. FINDINGS: The cardiomediastinal silhouette is mildly enlarged in contour. No pleural effusion. No pneumothorax. No acute pleuroparenchymal abnormality. Visualized abdomen is unremarkable. IMPRESSION: No acute cardiopulmonary abnormality. Electronically Signed   By: SValentino SaxonM.D.   On: 07/03/2021 12:28   DG Shoulder Left  Result Date: 07/12/2021 CLINICAL DATA:  Acute left shoulder pain EXAM: LEFT SHOULDER - 2+ VIEW COMPARISON:  None. FINDINGS: There is no evidence of fracture or dislocation. Acromioclavicular degenerative changes. There is no evidence of severe arthropathy or other focal  bone abnormality. Soft tissues are unremarkable. Atherosclerotic plaque of the aorta. IMPRESSION: Negative. Electronically Signed   By: Iven Finn M.D.   On: 07/12/2021 18:39   ECHOCARDIOGRAM COMPLETE  Result Date: 07/05/2021    ECHOCARDIOGRAM REPORT   Patient Name:   Joshua Cole Date of Exam: 07/04/2021 Medical Rec #:  [id number]        Height:       70.0 in Accession #:    [id number]       Weight:       249.3 lb Date of Birth:  04/27/1961       BSA:          2.292 m Patient Age:    54 years         BP:           107/62 mmHg Patient Gender: M                HR:           70 bpm. Exam Location:  [facility] Procedure: 2D Echo, Cardiac Doppler and Color Doppler Indications:     Bacteremia R78.81  History:         Patient has no prior history of Echocardiogram examinations. No                  heart history listed in medical file.  Sonographer:     Sherrie Sport Referring Phys:  [id number] Sidney Ace Diagnosing Phys: Kathlyn Sacramento MD  Sonographer Comments: Technically challenging study due to limited acoustic windows, suboptimal apical window and suboptimal parasternal window. IMPRESSIONS  1. Left ventricular ejection fraction, by estimation, is 55 to 60%. The left ventricle  has normal function. Left ventricular endocardial border not optimally defined to evaluate regional wall motion. There is mild left ventricular hypertrophy. Left ventricular diastolic parameters are indeterminate.  2. Right ventricular systolic function is normal. The right ventricular size is normal. Tricuspid regurgitation signal is inadequate for assessing PA pressure.  3. Left atrial size was mildly dilated.  4. Right atrial size was mildly dilated.  5. The mitral valve is normal in structure. No evidence of mitral valve regurgitation. No evidence of mitral stenosis.  6. The aortic valve is normal in structure. Aortic valve regurgitation is not visualized. Mild aortic valve sclerosis is present, with no evidence of aortic valve stenosis.  7. Aortic dilatation noted. There is borderline dilatation of the aortic root and of the ascending aorta, measuring 38 mm.  8. The tricuspid was not well visualized. However, there is likely a mobile vergetation noted on the short axis images. Recommend a TEE. FINDINGS  Left Ventricle: Left ventricular ejection fraction, by estimation, is 55 to 60%. The left ventricle has normal function. Left ventricular endocardial border not optimally defined to evaluate regional wall motion. The left ventricular internal cavity size was normal in size. There is mild left ventricular hypertrophy. Left ventricular diastolic parameters are indeterminate. Right Ventricle: The right ventricular size is normal. No increase in right ventricular wall thickness. Right ventricular systolic function is normal. Tricuspid regurgitation signal is inadequate for assessing PA pressure. Left Atrium: Left atrial size was mildly dilated. Right Atrium: Right atrial size was mildly dilated. Pericardium: There is no evidence of pericardial effusion. Mitral Valve: The mitral valve is normal in structure. There is mild calcification of the mitral valve leaflet(s). No evidence of mitral valve regurgitation. No  evidence of mitral valve stenosis. Tricuspid Valve: The tricuspid valve is not well visualized. Tricuspid  valve regurgitation is not demonstrated. No evidence of tricuspid stenosis. Aortic Valve: The aortic valve is normal in structure. Aortic valve regurgitation is not visualized. Mild aortic valve sclerosis is present, with no evidence of aortic valve stenosis. Aortic valve mean gradient measures 4.0 mmHg. Aortic valve peak gradient measures 7.7 mmHg. Aortic valve area, by VTI measures 3.66 cm. Pulmonic Valve: The pulmonic valve was normal in structure. Pulmonic valve regurgitation is not visualized. No evidence of pulmonic stenosis. Aorta: Aortic dilatation noted. There is borderline dilatation of the aortic root and of the ascending aorta, measuring 38 mm. Venous: The inferior vena cava was not well visualized. IAS/Shunts: No atrial level shunt detected by color flow Doppler.  LEFT VENTRICLE PLAX 2D LVIDd:         3.70 cm   Diastology LVIDs:         2.70 cm   LV e' medial:    5.55 cm/s LV PW:         1.30 cm   LV E/e' medial:  14.4 LV IVS:        0.95 cm   LV e' lateral:   12.50 cm/s LVOT diam:     2.20 cm   LV E/e' lateral: 6.4 LV SV:         67 LV SV Index:   29 LVOT Area:     3.80 cm  RIGHT VENTRICLE RV Basal diam:  3.70 cm LEFT ATRIUM           Index        RIGHT ATRIUM           Index LA diam:      4.30 cm 1.88 cm/m   RA Area:     21.80 cm LA Vol (A4C): 69.6 ml 30.36 ml/m  RA Volume:   77.30 ml  33.72 ml/m  AORTIC VALVE                    PULMONIC VALVE AV Area (Vmax):    2.64 cm     PV Vmax:        0.81 m/s AV Area (Vmean):   3.00 cm     PV Peak grad:   2.7 mmHg AV Area (VTI):     3.66 cm     RVOT Peak grad: 4 mmHg AV Vmax:           139.00 cm/s AV Vmean:          81.200 cm/s AV VTI:            0.182 m AV Peak Grad:      7.7 mmHg AV Mean Grad:      4.0 mmHg LVOT Vmax:         96.60 cm/s LVOT Vmean:        64.000 cm/s LVOT VTI:          0.175 m LVOT/AV VTI ratio: 0.96  AORTA Ao Root diam: 3.73 cm  MITRAL VALVE               TRICUSPID VALVE MV Area (PHT): 2.30 cm    TR Peak grad:   9.4 mmHg MV Decel Time: 330 msec    TR Vmax:        153.00 cm/s MV E velocity: 79.70 cm/s MV A velocity: 90.00 cm/s  SHUNTS MV E/A ratio:  0.89        Systemic VTI:  0.18 m  Systemic Diam: 2.20 cm Kathlyn Sacramento MD Electronically signed by Kathlyn Sacramento MD Signature Date/Time: 07/05/2021/12:50:50 PM    Final    ECHO TEE  Result Date: 07/07/2021    TRANSESOPHOGEAL ECHO REPORT   Patient Name:   Joshua Cole Date of Exam: 07/07/2021 Medical Rec #:  [id number]        Height:       70.0 in Accession #:    [id number]       Weight:       249.3 lb Date of Birth:  Jul 22, 1961       BSA:          2.292 m Patient Age:    60 years         BP:           113/61 mmHg Patient Gender: M                HR:           71 bpm. Exam Location:  [facility] Procedure: Transesophageal Echo, Cardiac Doppler and Color Doppler Indications:     bacteremia  History:         Patient has prior history of Echocardiogram examinations, most                  recent 07/04/2021. Bacteremia.  Sonographer:     Sherrie Sport Referring Phys:  [id number] Louise Diagnosing Phys: Kate Sable MD PROCEDURE: The transesophogeal probe was passed without difficulty through the esophogus of the patient. Sedation performed by performing physician. The patient developed no complications during the procedure. IMPRESSIONS  1. Left ventricular ejection fraction, by estimation, is 55 to 60%. The left ventricle has normal function.  2. Right ventricular systolic function is normal. The right ventricular size is normal.  3. No left atrial/left atrial appendage thrombus was detected.  4. The mitral valve is normal in structure. Mild mitral valve regurgitation.  5. There is a mobile mass attached to the tricuspid valve (clip 132) consistent with a vegetation given the current clinical context.. The tricuspid valve is degenerative.  6. The aortic valve is  tricuspid. Aortic valve regurgitation is not visualized. 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 FINDINGS  Left Ventricle: Left ventricular ejection fraction, by estimation, is 55 to 60%. The left ventricle has normal function. The left ventricular internal cavity size was normal in size. Right Ventricle: The right ventricular size is normal. No increase in right ventricular wall thickness. Right ventricular systolic function is normal. Left Atrium: Left atrial size was normal in size. No left atrial/left atrial appendage thrombus was detected. Right Atrium: Right atrial size was normal in size. Pericardium: There is no evidence of pericardial effusion. Mitral Valve: The mitral valve is normal in structure. Mild mitral valve regurgitation. Tricuspid Valve: There is a mobile mass attached to the tricuspid valve (clip 132) consistent with a vegetation given the current clinical context. The tricuspid valve is degenerative in appearance. Tricuspid valve regurgitation is trivial. Aortic Valve: The aortic valve is tricuspid. Aortic valve regurgitation is not visualized. Pulmonic Valve: The pulmonic valve was normal in structure. Pulmonic valve regurgitation is not visualized. Aorta: The aortic root is normal in size and structure. IAS/Shunts: No atrial level shunt detected by color flow Doppler. Kate Sable MD Electronically signed by Kate Sable MD Signature Date/Time: 07/07/2021/5:55:00 PM    Final    VAS [place] UPPER EXTREMITY VENOUS DUPLEX  Result Date: 07/12/2021 UPPER VENOUS STUDY  Patient Name:  Gypsy Lore  Date of Exam:   07/11/2021 Medical Rec #: [id number]         Accession #:    [id number] Date of Birth: 04/10/1961        Patient Gender: M Patient Age:   69 years Exam Location:  [facility] Procedure:      VAS [place] UPPER EXTREMITY VENOUS DUPLEX Referring Phys: Cristopher Peru  --------------------------------------------------------------------------------  Indications: Patient states he is unable to raise arm Limitations: Patient somnolent and unable to keep head and arm turned. Comparison Study: No prior study Performing Technologist: Sharion Dove RVS  Examination Guidelines: A complete evaluation includes B-mode imaging, spectral Doppler, color Doppler, and power Doppler as needed of all accessible portions of each vessel. Bilateral testing is considered an integral part of a complete examination. Limited examinations for reoccurring indications may be performed as noted.  Right Findings: +----------+------------+---------+-----------+----------+-------+ RIGHT     CompressiblePhasicitySpontaneousPropertiesSummary +----------+------------+---------+-----------+----------+-------+ Subclavian               Yes       Yes                      +----------+------------+---------+-----------+----------+-------+  Left Findings: +----------+------------+---------+-----------+----------+--------------+ LEFT      CompressiblePhasicitySpontaneousProperties   Summary     +----------+------------+---------+-----------+----------+--------------+ IJV                                                 Not visualized +----------+------------+---------+-----------+----------+--------------+ Subclavian               Yes       Yes                             +----------+------------+---------+-----------+----------+--------------+ Brachial      Full                                                 +----------+------------+---------+-----------+----------+--------------+ Cephalic      Full                                                 +----------+------------+---------+-----------+----------+--------------+ Basilic       Full                                                 +----------+------------+---------+-----------+----------+--------------+   Summary:  Right: No evidence of superficial vein thrombosis in the upper extremity.  Left: No obvious evidence of deep vein thrombosis in the upper extremity. No evidence of superficial vein thrombosis in the upper extremity.  *See table(s) above for measurements and observations.  Diagnosing physician: Orlie Pollen Electronically signed by Orlie Pollen on 07/12/2021 at 2:35:00 PM.    Final    [place] EKG SITE RITE  Result Date: 07/12/2021 If Site Rite image not attached, placement could not be confirmed due to current cardiac rhythm.  US Abdomen Limited RUQ (LIVER/GB)  Result Date: 07/13/2021 CLINICAL DATA:  Abnormal LFTs. EXAM: ULTRASOUND ABDOMEN LIMITED RIGHT UPPER QUADRANT COMPARISON:  None. FINDINGS: Gallbladder: No gallstones or wall thickening visualized. No sonographic Murphy sign noted by sonographer. The gallbladder is predominantly contracted. There is adenomyomatosis of the gallbladder fundus. Common bile duct: Diameter: 5 mm Liver: There is diffuse increased liver echogenicity most commonly seen in the setting of fatty infiltration. Superimposed inflammation or fibrosis is not excluded. Clinical correlation is recommended. The liver is enlarged measuring approximately 20 cm in length. Portal vein is patent on color Doppler imaging with normal direction of blood flow towards the liver. Other: None. IMPRESSION: Enlarged fatty liver may represent steatohepatitis. Correlation with clinical exam and LFTs recommended. Electronically Signed   By: Anner Crete M.D.   On: 07/13/2021 00:14     Colonoscopy 07/16/21 -- Impression:                - The examined portion of the ileum was normal. - Two 2 mm polyps in the transverse colon and in the cecum, removed with a jumbo cold forceps. Resected and retrieved. - Two 3 to 4 mm polyps in the descending colon and in the transverse colon, removed with a cold snare. Resected and retrieved. - Non-bleeding internal hemorrhoids.   EGD 07/16/21  -- Impression:                - Esophageal ulcer with no bleeding and no stigmata of recent bleeding. - Gastritis. Biopsied. - Non-bleeding duodenal ulcers with a clean ulcer   base (Forrest Class III).     Subjective: Pt feels well this AM. Eager to go home.  Denies abdominal pain, N/V, melena or bloody stool, F/C, CP or palpitations.     Discharge Exam: Vitals:   07/20/21 0735 07/20/21 1048  BP: 116/69 (!) 109/54  Pulse: 65 75  Resp: 16 16  Temp: 98.6 F (37 C) 98.3 F (36.8 C)  SpO2: 97% 99%   Vitals:   07/20/21 0324 07/20/21 0556 07/20/21 0735 07/20/21 1048  BP: (!) 95/46  116/69 (!) 109/54  Pulse: 65  65 75  Resp: '17  16 16  ' Temp: 98.3 F (36.8 C)  98.6 F (37 C) 98.3 F (36.8 C)  TempSrc: Oral  Oral Oral  SpO2: 98%  97% 99%  Weight:  104.5 kg    Height:        General: Pt is alert, awake, not in acute distress Cardiovascular: RRR, S1/S2 +, no rubs, no gallops Respiratory: CTA bilaterally, no wheezing, no rhonchi Abdominal: Soft, NT, ND, bowel sounds + Extremities: no edema, no cyanosis    The results of significant diagnostics from this hospitalization (including imaging, microbiology, ancillary and laboratory) are listed below for reference.     Microbiology: No results found for this or any previous visit (from the past 240 hour(s)).   Labs: BNP (last 3 results) Recent Labs    07/05/21 0747  BNP 808.8*   Basic Metabolic Panel: Recent Labs  Lab 07/14/21 0225 07/14/21 1517 07/15/21 0630 07/15/21 1612 07/16/21 1125 07/16/21 1217 07/17/21 0238 07/17/21 1720 07/18/21 1500 07/19/21 1721 07/20/21 0406  NA 121*   < > 129*   < >  --  132* 131* 128* 126* 133* 133*  K 5.1   < > 4.6   < >  --  3.6 4.0 4.0 3.8  --  3.9  CL 91*   < > 98   < >  --  100 99 98 93*  --  98  CO2 24   < >  27   < >  --  '27 26 24 26  ' --  27  GLUCOSE 270*   < > 197*   < >  --  112* 108* 170* 256*  --  114*  BUN 47*   < > 39*   < >  --  '13 10 9 8  ' --  12  CREATININE  1.10   < > 0.85   < >  --  0.71 0.89 0.82 0.79  --  0.76  CALCIUM 7.2*   < > 7.6*   < >  --  7.5* 7.8* 7.6* 7.3*  --  8.0*  MG  --   --   --   --  2.3  --   --   --   --   --  2.2  PHOS 3.3  --  3.9  --   --  4.3  --   --   --   --   --    < > = values in this interval not displayed.   Liver Function Tests: Recent Labs  Lab 07/14/21 0225 07/15/21 0630 07/16/21 1217 07/19/21 1730  AST  --   --   --  83*  ALT  --   --   --  55*  ALKPHOS  --   --   --  70  BILITOT  --   --   --  0.6  PROT  --   --   --  6.5  ALBUMIN <1.5* <1.5* 1.6* 1.7*   No results for input(s): LIPASE, AMYLASE in the last 168 hours. No results for input(s): AMMONIA in the last 168 hours. CBC: Recent Labs  Lab 07/15/21 0630 07/15/21 1612 07/16/21 1125 07/17/21 0238 07/17/21 0239 07/17/21 1720 07/18/21 0906 07/19/21 0336 07/20/21 0406  WBC 8.8  --  7.0  --  10.6*  --  8.9  --   --   HGB 6.0*   < > 6.4*   < > 8.6* 8.2* 8.5* 7.7* 7.7*  HCT 17.7*   < > 19.2*   < > 25.7* 24.4* 26.6* 23.7* 24.0*  MCV 94.1  --  96.0  --  92.8  --  96.0  --   --   PLT 147*  --  140*  --  149*  --  140*  --   --    < > = values in this interval not displayed.   Cardiac Enzymes: No results for input(s): CKTOTAL, CKMB, CKMBINDEX, TROPONINI in the last 168 hours. BNP: Invalid input(s): POCBNP CBG: Recent Labs  Lab 07/20/21 0553 07/20/21 0807 07/20/21 1022 07/20/21 1023 07/20/21 1110  GLUCAP 109* 105* 150* 156* 148*   D-Dimer No results for input(s): DDIMER in the last 72 hours. Hgb A1c No results for input(s): HGBA1C in the last 72 hours. Lipid Profile No results for input(s): CHOL, HDL, LDLCALC, TRIG, CHOLHDL, LDLDIRECT in the last 72 hours. Thyroid function studies No results for input(s): TSH, T4TOTAL, T3FREE, THYROIDAB in the last 72 hours.  Invalid input(s): FREET3 Anemia work up Recent Labs    07/20/21 1200  FERRITIN 380*  TIBC 297  IRON 20*   Urinalysis    Component Value Date/Time   COLORURINE  YELLOW (A) 07/03/2021 1509   APPEARANCEUR CLEAR (A) 07/03/2021 1509   LABSPEC 1.012 07/03/2021 1509   PHURINE 5.0 07/03/2021 1509   GLUCOSEU >=500 (A) 07/03/2021 1509   HGBUR SMALL (A) 07/03/2021 Joshua NEGATIVE 07/03/2021 1509   KETONESUR  NEGATIVE 07/03/2021 [place] 07/03/2021 1509   NITRITE NEGATIVE 07/03/2021 1509   LEUKOCYTESUR NEGATIVE 07/03/2021 1509   Sepsis Labs Invalid input(s): PROCALCITONIN,  WBC,  LACTICIDVEN Microbiology No results found for this or any previous visit (from the past 240 hour(s)).   Time coordinating discharge: Over 30 minutes  SIGNED:   Ezekiel Slocumb, DO [facility] 07/20/2021, 1:20 PM   If 7PM-7AM, please contact night-coverage [email or web address]

## 2021-07-20 NOTE — Progress Notes (Addendum)
[facility] Progress Note  Name: Joshua Cole MRN: [id number] DOB: 1960-10-23  Chief Complaint:  Hyponatremia  Subjective:  Patient has no complaints this morning, he states that he feels pretty good overall. Patient reports that the urea "wasn't the worst thing I've ever had" and that he would continue to take it daily as needed.  Review of systems:  Denies N/V, confusion, fatigue, chest pain, abdominal pain.    Intake/Output Summary (Last 24 hours) at 07/20/2021 0804 Last data filed at 07/20/2021 2409 Gross per 24 hour  Intake 2869.2 ml  Output 2100 ml  Net 769.2 ml    Vitals:  Vitals:   07/19/21 2308 07/20/21 0324 07/20/21 0556 07/20/21 0700  BP: (!) 128/53 (!) 95/46  116/69  Pulse: 68 65  65  Resp: 18 17  16   Temp: 97.6 F (36.4 C) 98.3 F (36.8 C)  98.6 F (37 C)  TempSrc: Oral Oral  Oral  SpO2: 97% 98%  97%  Weight:   104.5 kg   Height:         Physical Exam:  General: adult male in bed, NAD HEENT NCAT, EOMI, sclera anicteric Neck: Supple, trachea midline Lungs: CTAB, normal WOB at rest  Heart: S1-S2, RRR Abdomen: Soft, nontender, nondistended Extremities: No pitting edema of lower extremities.  Left upper extremity with edema present.  Left arm weakness compared to the right (remains at baseline since hospitalization) Psych: Normal mood and affect  Medications reviewed   Labs:  BMP Latest Ref Rng & Units 07/20/2021 07/19/2021 07/18/2021  Glucose 70 - 99 mg/dL 114(H) - 256(H)  BUN 6 - 20 mg/dL 12 - 8  Creatinine 0.61 - 1.24 mg/dL 0.76 - 0.79  Sodium 135 - 145 mmol/L 133(L) 133(L) 126(L)  Potassium 3.5 - 5.1 mmol/L 3.9 - 3.8  Chloride 98 - 111 mmol/L 98 - 93(L)  CO2 22 - 32 mmol/L 27 - 26  Calcium 8.9 - 10.3 mg/dL 8.0(L) - 7.3(L)     Assessment/Plan:   # Hyponatremia, improving - Na today 133 - initially Hyponatremia felt 2/2 beer potomania - Euvolemic to slight overload volume status - Hepatic panel with mild AST/ALT  elevation.  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  Agree with note and exam as documented above by [name] and as noted here.  General adult male in bed in no acute distress HEENT normocephalic atraumatic extraocular movements intact sclera anicteric Neck supple trachea midline Lungs clear to auscultation bilaterally normal work of breathing at rest  Heart regular rate and rhythm no rubs or gallops appreciated Abdomen soft nontender nondistended Extremities edema.  Left arm gross weakness Psych normal mood and affect Neuro alert and oriented x 3 provides basic hx and follows commands. Weakness as above   Hyponatremia  - improved and 2/2 beer potomania - would plan to discharge on urea 15 gram three times a week (ie. MWF) - encouraged EtOH cessation - fluid restriction of 1.5 liters a day - continue  same  EtOH abuse  - he states plans for EtOH cessation and let him know importance of same   Normocytic anemia  - monitor for transfusion needs  Nephrology will sign off.  Please do not hesitate to contact me with questions.  I will set up renal follow-up in 1-2 in clinic with [facility]  [name], MD 07/20/2021  10:49 AM

## 2021-07-20 NOTE — Discharge Instructions (Signed)
SLIDING SCALE INSULIN ---  For glucose 70-120: none For 121-150: 2 units For 151-200: 3 units For 201-250: 5 units For 251-300: 8 units For 301-350: 11 units For 351-400: 15 units For > 400: 20 units and call your doctor

## 2021-07-22 ENCOUNTER — Other Ambulatory Visit: Payer: Self-pay | Admitting: Family Medicine

## 2021-07-22 ENCOUNTER — Other Ambulatory Visit: Payer: Self-pay

## 2021-07-22 LAB — SURGICAL PATHOLOGY

## 2021-07-22 MED ORDER — RIGHTEST GS550 BLOOD GLUCOSE VI STRP
ORAL_STRIP | 0 refills | Status: DC
  Filled 2021-07-22: qty 100, 25d supply, fill #0

## 2021-07-22 MED ORDER — RIGHTEST GL300 LANCETS MISC
0 refills | Status: DC
Start: 2021-07-22 — End: 2021-08-25
  Filled 2021-07-22: qty 100, 25d supply, fill #0

## 2021-07-22 MED ORDER — BASAGLAR KWIKPEN 100 UNIT/ML ~~LOC~~ SOPN
25.0000 [IU] | PEN_INJECTOR | Freq: Every day | SUBCUTANEOUS | 11 refills | Status: DC
  Filled 2021-07-22: qty 15, 60d supply, fill #0
  Filled 2021-09-29: qty 15, 60d supply, fill #1

## 2021-07-22 MED ORDER — INSULIN PEN NEEDLE 32G X 4 MM MISC
1.0000 | Freq: Three times a day (TID) | 1 refills | Status: DC
  Filled 2021-07-22: qty 100, 25d supply, fill #0
  Filled 2021-08-23: qty 100, 25d supply, fill #1

## 2021-07-22 MED ORDER — UREA 15 G PO PACK
15.0000 g | PACK | ORAL | 0 refills | Status: DC
  Filled 2021-07-22: qty 18, 42d supply, fill #0

## 2021-07-22 MED ORDER — INSULIN LISPRO (1 UNIT DIAL) 100 UNIT/ML (KWIKPEN)
0.0000 [IU] | PEN_INJECTOR | Freq: Three times a day (TID) | SUBCUTANEOUS | 11 refills | Status: DC
Start: 2021-07-22 — End: 2021-11-02
  Filled 2021-07-22: qty 15, 34d supply, fill #0
  Filled 2021-08-30: qty 15, 34d supply, fill #1

## 2021-07-22 MED ORDER — PANTOPRAZOLE SODIUM 40 MG PO TBEC
40.0000 mg | DELAYED_RELEASE_TABLET | Freq: Two times a day (BID) | ORAL | 2 refills | Status: DC
Start: 2021-07-22 — End: 2021-08-25
  Filled 2021-07-22: qty 60, 30d supply, fill #0

## 2021-07-22 MED ORDER — FERROUS SULFATE 325 (65 FE) MG PO TABS
325.0000 mg | ORAL_TABLET | Freq: Every day | ORAL | 1 refills | Status: DC
  Filled 2021-07-22: qty 30, 30d supply, fill #0
  Filled 2021-08-23: qty 30, 30d supply, fill #1

## 2021-07-22 MED ORDER — BLOOD GLUCOSE MONITOR KIT
PACK | 0 refills | Status: DC
Start: 2021-07-22 — End: 2021-08-25
  Filled 2021-07-22: qty 1, 30d supply, fill #0

## 2021-07-22 MED ORDER — FUROSEMIDE 20 MG PO TABS
20.0000 mg | ORAL_TABLET | Freq: Every day | ORAL | 2 refills | Status: DC | PRN
  Filled 2021-07-22 – 2022-02-10 (×2): qty 30, 30d supply, fill #0
  Filled 2022-07-13: qty 30, 30d supply, fill #1

## 2021-07-22 MED ORDER — SITAGLIPTIN PHOSPHATE 100 MG PO TABS
50.0000 mg | ORAL_TABLET | Freq: Every day | ORAL | 2 refills | Status: DC
  Filled 2021-07-22: qty 15, 30d supply, fill #0
  Filled 2021-08-23: qty 15, 30d supply, fill #1

## 2021-07-22 NOTE — Progress Notes (Signed)
Meds incorrectly sent to [facility] pharmacy.  Resent to med management clinic, discussed with [facility], and they will provide initial fills, counseling on financial assistance for refills.  Equivalent formulary substitutions made.  (Basaglar, Humalog and Januvia)  Spoke with patient and explained above.

## 2021-07-23 ENCOUNTER — Telehealth: Payer: Self-pay

## 2021-07-23 NOTE — Telephone Encounter (Signed)
Home health team called requesting verbal orders for PICC dressing changes Q9 days and lab draws weekly. RN gave verbal orders and corrected dressing change to Q7 days per protocol.   Joshua Cole Lorita Officer, RN

## 2021-07-28 ENCOUNTER — Telehealth: Payer: Self-pay | Admitting: Cardiology

## 2021-07-28 NOTE — Telephone Encounter (Signed)
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

## 2021-07-28 NOTE — Telephone Encounter (Signed)
OT requesting orders for 2wk forward, please assist

## 2021-07-29 NOTE — Telephone Encounter (Signed)
Returned call to [facility].  Advised PCP would be in charge of ordering HH.  Appears Dr. Sherilyn Cooter with [facility] in [place] is Pt's PCP.

## 2021-07-29 NOTE — Telephone Encounter (Signed)
Home Health calling to check on status  Please advise ASAP

## 2021-08-09 ENCOUNTER — Encounter: Payer: Self-pay | Admitting: Infectious Disease

## 2021-08-09 ENCOUNTER — Telehealth: Payer: Self-pay

## 2021-08-09 NOTE — Telephone Encounter (Signed)
Left voicemail with verbal order to continue physical therapy. Leatrice Jewels, RMA

## 2021-08-09 NOTE — Telephone Encounter (Signed)
Received call from [facility], Physical therapist with [facility] requesting verbal orders to continue therapy. Patient has not been seen in clinic, is scheduled for [facility] on 12/15. No pcp listed in chart. Will forward message to MD to advise on orders. P: [phone] Leatrice Jewels, RMA

## 2021-08-16 ENCOUNTER — Encounter: Payer: Self-pay | Admitting: Infectious Disease

## 2021-08-16 ENCOUNTER — Telehealth: Payer: Self-pay

## 2021-08-16 NOTE — Telephone Encounter (Signed)
Received call from Maudie Mercury, [place] with [facility] regarding antbx concerns. States that when she did her home visit today she noticed patient had 14 doses left of antbx. Pt reports that he has not missed a dose and has been administering antbx as instructed.  RN just wanted to notify the office of this. Will contact pharmacy for pull picc orders. Leatrice Jewels, RMA

## 2021-08-17 NOTE — Telephone Encounter (Signed)
Thank you Cece!

## 2021-08-23 ENCOUNTER — Other Ambulatory Visit: Payer: Self-pay

## 2021-08-24 ENCOUNTER — Other Ambulatory Visit: Payer: Self-pay

## 2021-08-24 ENCOUNTER — Ambulatory Visit: Payer: Self-pay | Admitting: Pharmacy Technician

## 2021-08-24 DIAGNOSIS — Z79899 Other long term (current) drug therapy: Secondary | ICD-10-CM

## 2021-08-24 NOTE — Progress Notes (Signed)
Electrophysiology Office Follow up Visit Note:    Date:  08/25/2021   ID:  Joshua Cole, DOB 1960-10-02, MRN [id number]  PCP:  Pcp, No  [facility] Cardiologist:  Kathlyn Sacramento, MD  [facility] Electrophysiologist:  None    Interval History:    Joshua Cole is a 60 y.o. male who presents for a follow up visit.  I met the patient July 07, 2021 when he was hospitalized with complete heart block.  Blood cultures at the time of admission were positive for MSSA bacteremia.  Transesophageal echo showed a tricuspid valve vegetation.  Cardiothoracic surgery evaluation was recommended.  He had a stable escape mechanism so temporary transvenous pacing was not required.  The patient was transferred to [facility] for cardiothoracic surgery evaluation.  CT surgery recommended medical therapy only.  Today he is doing well.  He reports no fevers or chills.  Feels like his energy level is improving as he works with physical therapy.  He tells me he has completed his antibiotics.  He still has his PICC line in place.  He has scheduled follow-up with infectious disease tomorrow.     Past Medical History:  Diagnosis Date   Arthritis    Diabetes mellitus without complication ([place])    DJD (degenerative joint disease)    Gout     Past Surgical History:  Procedure Laterality Date   BIOPSY  07/16/2021   Procedure: BIOPSY;  Surgeon: Sharyn Creamer, MD;  Location: [facility] ENDOSCOPY;  Service: Gastroenterology;;  EGD and COLON   COLONOSCOPY N/A 07/16/2021   Procedure: COLONOSCOPY;  Surgeon: Sharyn Creamer, MD;  Location: [place];  Service: Gastroenterology;  Laterality: N/A;   COLONOSCOPY WITH PROPOFOL N/A 07/16/2021   Procedure: COLONOSCOPY WITH PROPOFOL;  Surgeon: Sharyn Creamer, MD;  Location: [place];  Service: Gastroenterology;  Laterality: N/A;   ESOPHAGOGASTRODUODENOSCOPY (EGD) WITH PROPOFOL N/A 07/16/2021   Procedure: ESOPHAGOGASTRODUODENOSCOPY (EGD) WITH PROPOFOL;  Surgeon:  Sharyn Creamer, MD;  Location: [place];  Service: Gastroenterology;  Laterality: N/A;   JOINT REPLACEMENT Bilateral    hip   POLYPECTOMY  07/16/2021   Procedure: POLYPECTOMY;  Surgeon: Sharyn Creamer, MD;  Location: [facility] ENDOSCOPY;  Service: Gastroenterology;;   TEE WITHOUT CARDIOVERSION N/A 07/07/2021   Procedure: TRANSESOPHAGEAL ECHOCARDIOGRAM (TEE);  Surgeon: Kate Sable, MD;  Location: [facility] ORS;  Service: Cardiovascular;  Laterality: N/A;    Current Medications: Current Meds  Medication Sig   acetaminophen (TYLENOL) 325 MG tablet Take 2 tablets (650 mg total) by mouth every 6 (six) hours as needed for mild pain or fever.   ferrous sulfate 325 (65 FE) MG tablet Take 1 tablet (325 mg total) by mouth once daily with breakfast.   furosemide (LASIX) 20 MG tablet Take 1 tablet (20 mg total) by mouth once daily as needed (swelling or weight gain).   glucose blood (RIGHTEST GS550 BLOOD GLUCOSE) test strip USE AS DIRECTED TO CHECK BLOOD SUGAR UP TO 4 TIMES DAILY.   Insulin Glargine (BASAGLAR KWIKPEN) 100 UNIT/ML Inject 25 Units into the skin once daily at bedtime.   insulin lispro (HUMALOG) 100 UNIT/ML KwikPen Inject 0-15 Units into the skin 3 (three) times daily by sliding scale instructions as directed.   Insulin Pen Needle 32G X 4 MM MISC Use as directed with insulin pens (4 times daily -  before meals and at bedtime).   meloxicam (MOBIC) 7.5 MG tablet Take 1 tablet (7.5 mg total) by mouth once daily.   Rightest GL300 Lancets MISC  USE AS DIRECTED TO CHECK BLOOD SUGAR UP TO 4 TIMES DAILY.   sitaGLIPtin (JANUVIA) 100 MG tablet Take (1/2) tablet (50 mg total) by mouth once daily.   urea (URE-NA) 15 g PACK oral packet Take 15 g by mouth once every Tuesday, Thursday, and Saturday at 6 PM.     Allergies:   Patient has no known allergies.   Social History   Socioeconomic History   Marital status: Unknown    Spouse name: Not on file   Number of children: Not on file   Years of  education: Not on file   Highest education level: Not on file  Occupational History   Not on file  Tobacco Use   Smoking status: Former    Packs/day: 0.25    Years: 15.00    Pack years: 3.75    Types: Cigarettes    Quit date: 2007    Years since quitting: 15.9   Smokeless tobacco: Never  Vaping Use   Vaping Use: Never used  Substance and Sexual Activity   Alcohol use: Not Currently    Alcohol/week: 20.0 standard drinks    Types: 20 Cans of beer per week    Comment: last use 07/03/21 (hospital admission)   Drug use: Never   Sexual activity: Not on file  Other Topics Concern   Not on file  Social History Narrative   Not on file   Social Determinants of Health   Financial Resource Strain: Not on file  Food Insecurity: No Food Insecurity   Worried About [place] in the Last Year: Never true   [place] in the Last Year: Never true  Transportation Needs: No Transportation Needs   Lack of Transportation (Medical): No   Lack of Transportation (Non-Medical): No  Physical Activity: Not on file  Stress: Not on file  Social Connections: Not on file     Family History: The patient's family history includes Breast cancer in his sister; Diabetes in his brother; Heart attack (age of onset: 29) in his brother; Hyperlipidemia in his brother; Hypertension in his brother; Lung cancer in his father; Other in his mother; Psoriasis in his sister.  ROS:   Please see the history of present illness.    All other systems reviewed and are negative.  EKGs/Labs/Other Studies Reviewed:    The following studies were reviewed today: Hospital records  EKG:  The ekg ordered today demonstrates accelerated junctional rhythm at 107 bpm.  Recent Labs: 07/05/2021: B Natriuretic Peptide 219.6 07/07/2021: TSH 0.162 07/18/2021: Platelets 140 07/19/2021: ALT 55 07/20/2021: BUN 12; Creatinine, Ser 0.76; Hemoglobin 7.7; Magnesium 2.2; Potassium 3.9; Sodium 133  Recent Lipid Panel     Component Value Date/Time   CHOL 96 07/08/2021 0628   TRIG 102 07/08/2021 0628   HDL <10 (L) 07/08/2021 0628   CHOLHDL NOT CALCULATED 07/08/2021 0628   VLDL 20 07/08/2021 0628   LDLCALC NOT CALCULATED 07/08/2021 0628    Physical Exam:    VS:  BP 128/74    Pulse (!) 107    Ht '5\' 11"'  (1.803 m)    Wt 233 lb 12.8 oz (106.1 kg)    SpO2 98%    BMI 32.61 kg/m     Wt Readings from Last 3 Encounters:  08/25/21 233 lb 12.8 oz (106.1 kg)  08/25/21 232 lb 1.6 oz (105.3 kg)  07/20/21 230 lb 6.4 oz (104.5 kg)     GEN:  Well nourished, well developed in no acute distress  HEENT: Normal NECK: No JVD; No carotid bruits LYMPHATICS: No lymphadenopathy CARDIAC: Regular rhythm, tachycardic, no murmurs, rubs, gallops RESPIRATORY:  Clear to auscultation without rales, wheezing or rhonchi  ABDOMEN: Soft, non-tender, non-distended MUSCULOSKELETAL:  No edema; No deformity  SKIN: Warm and dry NEUROLOGIC:  Alert and oriented x 3 PSYCHIATRIC:  Normal affect        ASSESSMENT:    1. Heart block AV complete ([place])   2. Acute bacterial endocarditis   3. 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  He is now in an accelerated junctional rhythm.  No syncope or presyncope.  I suspect this is a very reliable escape mechanism for him.  He is currently receiving therapy for his endocarditis and is scheduled to meet with the infectious disease team tomorrow.  I would like to avoid permanent pacemaker implant as long as possible given his history of endocarditis with tricuspid valve vegetation.  Not only is there an infectious concern with implanting a lead but also a concern of dislodging the tricuspid valve vegetation.  I discussed the red flag symptoms that would prompt an urgent presentation to the emergency department including lightheadedness, dizziness,  syncope.  I would like him to be seen in about 3 months in our clinic with general cardiology.  At that appointment, likely need to repeat the transesophageal echocardiogram to reassess the tricuspid valve. Will defer to general cardiologist on timing.    Medication Adjustments/Labs and Tests Ordered: Current medicines are reviewed at length with the patient today.  Concerns regarding medicines are outlined above.  No orders of the defined types were placed in this encounter.  No orders of the defined types were placed in this encounter.    Signed, Lars Mage, MD, [facility], [facility] 08/25/2021 2:07 PM    Electrophysiology [facility]

## 2021-08-24 NOTE — Progress Notes (Signed)
Completed [facility] application and contract.  Patient agreed to all terms of the [facility] contract.    Patient approved to receive medication assistance at [facility] until time for re-certification in 2023, and as long as eligibility criteria continues to be met.    Provided patient with community resource material based on his particular needs.    Fatimah Sundquist J. Shann Lewellyn Care Manager [facility]  

## 2021-08-25 ENCOUNTER — Other Ambulatory Visit: Payer: Self-pay

## 2021-08-25 ENCOUNTER — Encounter: Payer: Self-pay | Admitting: Gerontology

## 2021-08-25 ENCOUNTER — Ambulatory Visit (INDEPENDENT_AMBULATORY_CARE_PROVIDER_SITE_OTHER): Payer: Self-pay | Admitting: Cardiology

## 2021-08-25 ENCOUNTER — Encounter: Payer: Self-pay | Admitting: Cardiology

## 2021-08-25 ENCOUNTER — Ambulatory Visit: Payer: Self-pay | Admitting: Gerontology

## 2021-08-25 VITALS — BP 126/82 | HR 107 | Temp 97.9°F | Resp 17 | Ht 70.0 in | Wt 232.1 lb

## 2021-08-25 VITALS — BP 128/74 | HR 107 | Ht 71.0 in | Wt 233.8 lb

## 2021-08-25 DIAGNOSIS — M25512 Pain in left shoulder: Secondary | ICD-10-CM

## 2021-08-25 DIAGNOSIS — I442 Atrioventricular block, complete: Secondary | ICD-10-CM

## 2021-08-25 DIAGNOSIS — Z8739 Personal history of other diseases of the musculoskeletal system and connective tissue: Secondary | ICD-10-CM | POA: Insufficient documentation

## 2021-08-25 DIAGNOSIS — I330 Acute and subacute infective endocarditis: Secondary | ICD-10-CM

## 2021-08-25 DIAGNOSIS — I498 Other specified cardiac arrhythmias: Secondary | ICD-10-CM

## 2021-08-25 DIAGNOSIS — E119 Type 2 diabetes mellitus without complications: Secondary | ICD-10-CM

## 2021-08-25 DIAGNOSIS — D62 Acute posthemorrhagic anemia: Secondary | ICD-10-CM

## 2021-08-25 DIAGNOSIS — Z7689 Persons encountering health services in other specified circumstances: Secondary | ICD-10-CM | POA: Insufficient documentation

## 2021-08-25 MED ORDER — RIGHTEST GL300 LANCETS MISC
0 refills | Status: DC
  Filled 2021-08-25: qty 100, 25d supply, fill #0

## 2021-08-25 MED ORDER — MELOXICAM 7.5 MG PO TABS
7.5000 mg | ORAL_TABLET | Freq: Every day | ORAL | 0 refills | Status: DC
Start: 2021-08-25 — End: 2021-09-21
  Filled 2021-08-25: qty 30, 30d supply, fill #0

## 2021-08-25 MED ORDER — FERROUS SULFATE 325 (65 FE) MG PO TABS
325.0000 mg | ORAL_TABLET | Freq: Every day | ORAL | 1 refills | Status: DC
  Filled 2021-08-25 – 2021-09-21 (×2): qty 30, 30d supply, fill #0
  Filled 2021-10-21: qty 30, 30d supply, fill #1

## 2021-08-25 MED ORDER — SITAGLIPTIN PHOSPHATE 100 MG PO TABS
50.0000 mg | ORAL_TABLET | Freq: Every day | ORAL | 2 refills | Status: DC
  Filled 2021-08-25 – 2021-09-21 (×2): qty 15, 30d supply, fill #0
  Filled 2021-10-21: qty 15, 30d supply, fill #1

## 2021-08-25 MED ORDER — RIGHTEST GS550 BLOOD GLUCOSE VI STRP
ORAL_STRIP | 0 refills | Status: DC
  Filled 2021-08-25: qty 100, 25d supply, fill #0

## 2021-08-25 NOTE — Patient Instructions (Addendum)
Medication Instructions:  Your physician recommends that you continue on your current medications as directed. Please refer to the Current Medication list given to you today. *If you need a refill on your cardiac medications before your next appointment, please call your pharmacy*  Lab Work: None ordered. If you have labs (blood work) drawn today and your tests are completely normal, you will receive your results only by: [place] (if you have MyChart) OR A paper copy in the mail If you have any lab test that is abnormal or we need to change your treatment, we will call you to review the results.  Testing/Procedures: None ordered.  Follow-Up: At [facility], you and your health needs are our priority.  As part of our continuing mission to provide you with exceptional heart care, we have created designated Provider Care Teams.  These Care Teams include your primary Cardiologist (physician) and Advanced Practice Providers (APPs -  Physician Assistants and Nurse Practitioners) who all work together to provide you with the care you need, when you need it.  Your next appointment:   Your physician wants you to follow-up in: 3 months with one of the following Advanced Practice Providers on your designated Care Team:   [name] Hodgkins, NP Christell Faith, PA-C Cadence Kathlen Mody, [place]

## 2021-08-25 NOTE — Progress Notes (Signed)
New Patient Office Visit  Subjective:  Patient ID: Joshua Cole, male    DOB: Dec 29, 1960  Age: 60 y.o. MRN: [id number]  CC:  Chief Complaint  Patient presents with   Establish Care    HPI Derrick Tiegs is a 60 year old male who has history of arthritis, type 2 diabetes, DJD, Gout, presents to establish care and evaluation of his chronic conditions. He was d/c from hospital on 07/20/21. During  his hospital course , he was treated for Acute bacterial endocardiatis,  MSSA bacteremia, has right arm PICC line and IV Ancef and will follow up with Infectious Disease on 08/26/21 . He also has Heart block and will follow up with Cardiologist Dr Quentin Ore. Hid HgbA1c was 7.8%, he checks his blood glucose tid and this morning was 109 mg/dl. He states that he's compliant with his medication and continues to make healthy lifestyle changes. He denies hypo/hyperglycemic symptoms, peripheral neuropathy and performs daily foot checks. He has a history of gout, and c/o flare to his right great toe that has been going on for 2 weeks. He states that he was notifed at the hospital that he has torn rotator cuff to his left shoulder. Imaging done on 07/14/21 showed full thickness, near full width tear of the supraspinatus tendon at the footprint. He c/o constant non radiating pain to left shoulder with any activity or use. He states that he's unable to flex, abduct or adduct his left arm. He denies any relieving factor. Overall, he states that he's doing well and offers no further complaint.   Past Medical History:  Diagnosis Date   Arthritis    Diabetes mellitus without complication ([place])    DJD (degenerative joint disease)    Gout     Past Surgical History:  Procedure Laterality Date   BIOPSY  07/16/2021   Procedure: BIOPSY;  Surgeon: Sharyn Creamer, MD;  Location: [facility] ENDOSCOPY;  Service: Gastroenterology;;  EGD and COLON   COLONOSCOPY N/A 07/16/2021   Procedure: COLONOSCOPY;  Surgeon: Sharyn Creamer,  MD;  Location: [place];  Service: Gastroenterology;  Laterality: N/A;   COLONOSCOPY WITH PROPOFOL N/A 07/16/2021   Procedure: COLONOSCOPY WITH PROPOFOL;  Surgeon: Sharyn Creamer, MD;  Location: [place];  Service: Gastroenterology;  Laterality: N/A;   ESOPHAGOGASTRODUODENOSCOPY (EGD) WITH PROPOFOL N/A 07/16/2021   Procedure: ESOPHAGOGASTRODUODENOSCOPY (EGD) WITH PROPOFOL;  Surgeon: Sharyn Creamer, MD;  Location: [place];  Service: Gastroenterology;  Laterality: N/A;   JOINT REPLACEMENT Bilateral    hip   POLYPECTOMY  07/16/2021   Procedure: POLYPECTOMY;  Surgeon: Sharyn Creamer, MD;  Location: [facility] ENDOSCOPY;  Service: Gastroenterology;;   TEE WITHOUT CARDIOVERSION N/A 07/07/2021   Procedure: TRANSESOPHAGEAL ECHOCARDIOGRAM (TEE);  Surgeon: Kate Sable, MD;  Location: [facility] ORS;  Service: Cardiovascular;  Laterality: N/A;    Family History  Problem Relation Age of Onset   Other Mother        unknown medical history   Lung cancer Father    Psoriasis Sister    Breast cancer Sister    Hypertension Brother    Hyperlipidemia Brother    Diabetes Brother    Heart attack Brother 58    Social History   Socioeconomic History   Marital status: Unknown    Spouse name: Not on file   Number of children: Not on file   Years of education: Not on file   Highest education level: Not on file  Occupational History   Not on file  Tobacco Use  Smoking status: Former    Packs/day: 0.25    Years: 15.00    Pack years: 3.75    Types: Cigarettes    Quit date: 2007    Years since quitting: 15.9   Smokeless tobacco: Never  Vaping Use   Vaping Use: Never used  Substance and Sexual Activity   Alcohol use: Not Currently    Alcohol/week: 20.0 standard drinks    Types: 20 Cans of beer per week    Comment: last use 07/03/21 (hospital admission)   Drug use: Never   Sexual activity: Not on file  Other Topics Concern   Not on file  Social History Narrative   Not on file    Social Determinants of Health   Financial Resource Strain: Not on file  Food Insecurity: No Food Insecurity   Worried About [place] in the Last Year: Never true   [place] in the Last Year: Never true  Transportation Needs: No Transportation Needs   Lack of Transportation (Medical): No   Lack of Transportation (Non-Medical): No  Physical Activity: Not on file  Stress: Not on file  Social Connections: Not on file  Intimate Partner Violence: Not on file    ROS Review of Systems  Constitutional: Negative.   HENT: Negative.    Eyes: Negative.   Respiratory: Negative.    Gastrointestinal: Negative.   Endocrine: Negative.   Genitourinary: Negative.   Musculoskeletal:  Positive for arthralgias (left shoulder pain).  Skin: Negative.   Neurological: Negative.   Hematological: Negative.   Psychiatric/Behavioral: Negative.     Objective:   Today's Vitals: BP 126/82 (BP Location: Left Arm, Patient Position: Sitting, Cuff Size: Large)    Pulse (!) 107    Temp 97.9 F (36.6 C) (Oral)    Resp 17    Ht _0  (1.778 m)    Wt 232 lb 1.6 oz (105.3 kg)    SpO2 99%    BMI 33.30 kg/m   Physical Exam HENT:     Head: Normocephalic and atraumatic.     Nose: Nose normal.     Mouth/Throat:     Mouth: Mucous membranes are moist.  Eyes:     Extraocular Movements: Extraocular movements intact.     Conjunctiva/sclera: Conjunctivae normal.     Pupils: Pupils are equal, round, and reactive to light.  Cardiovascular:     Rate and Rhythm: Regular rhythm. Tachycardia present.     Pulses: Normal pulses.     Heart sounds: Normal heart sounds.  Pulmonary:     Effort: Pulmonary effort is normal.     Breath sounds: Normal breath sounds.  Abdominal:     General: Bowel sounds are normal.     Palpations: Abdomen is soft.  Genitourinary:    Comments: Deferred per patient Musculoskeletal:        General: Tenderness (left shoulder with palpation) present.     Cervical back:  Normal range of motion.  Skin:    General: Skin is warm.  Neurological:     General: No focal deficit present.     Mental Status: He is alert and oriented to person, place, and time. Mental status is at baseline.  Psychiatric:        Mood and Affect: Mood normal.        Behavior: Behavior normal.        Thought Content: Thought content normal.        Judgment: Judgment normal.    Assessment &  Plan:    1. Encounter to establish care -Routine labs to be checked - CBC w/Diff; Future - Iron Binding Cap (TIBC)([facility]); Future - Comp Met (CMET); Future - Lipid panel; Future - Urine Microalbumin w/creat. ratio; Future - Vitamin D (25 hydroxy); Future - Vitamin D (25 hydroxy) - Urine Microalbumin w/creat. ratio - Lipid panel - Comp Met (CMET) - Iron Binding Cap (TIBC)([facility]/Sunquest) - CBC w/Diff  2. Type 2 diabetes mellitus without complication, with long-term current use of insulin (HCC) - His HgbA1c was 7.8%, he will continue on current medication, advised to check blood glucose tid, record and bring log to follow up appointment. He is to continue on low carb/non concentrated sweet diet and exercise as tolerated. - sitaGLIPtin (JANUVIA) 100 MG tablet; Take (1/2) tablet (50 mg total) by mouth once daily.  Dispense: 15 tablet; Refill: 2 - glucose blood (RIGHTEST GS550 BLOOD GLUCOSE) test strip; USE AS DIRECTED TO CHECK BLOOD SUGAR UP TO 4 TIMES DAILY.  Dispense: 100 strip; Refill: 0 - Rightest GL300 Lancets MISC; USE AS DIRECTED TO CHECK BLOOD SUGAR UP TO 4 TIMES DAILY.  Dispense: 100 each; Refill: 0  3. History of gout - Will check lab and if positive, will prescribe colchicine - Uric acid; Future - Sedimentation rate; Future - Sedimentation rate - Uric acid  4. Acute blood loss anemia -Will recheck CBC with diff, and TIBC, he will continue on current medication. - ferrous sulfate 325 (65 FE) MG tablet; Take 1 tablet (325 mg total) by mouth once daily with  breakfast.  Dispense: 30 tablet; Refill: 1  5. Left shoulder pain, unspecified chronicity - He was started on Meloxicam, educated on medication side effects and advised to notify clinic.  - meloxicam (MOBIC) 7.5 MG tablet; Take 1 tablet (7.5 mg total) by mouth once daily.  Dispense: 30 tablet; Refill: 0 - He was encouraged to complete [facility] financial application for  Ambulatory referral to Orthopedic Surgery     Follow-up: Return in about 6 weeks (around 10/05/2021), or if symptoms worsen or fail to improve.   Tamar Miano Jerold Coombe, NP

## 2021-08-25 NOTE — Patient Instructions (Signed)

## 2021-08-26 ENCOUNTER — Other Ambulatory Visit: Payer: Self-pay | Admitting: Gerontology

## 2021-08-26 ENCOUNTER — Ambulatory Visit (INDEPENDENT_AMBULATORY_CARE_PROVIDER_SITE_OTHER): Payer: Self-pay | Admitting: Infectious Disease

## 2021-08-26 ENCOUNTER — Other Ambulatory Visit: Payer: Self-pay

## 2021-08-26 ENCOUNTER — Encounter: Payer: Self-pay | Admitting: Infectious Disease

## 2021-08-26 VITALS — BP 151/82 | HR 100 | Temp 98.2°F | Ht 70.0 in | Wt 234.0 lb

## 2021-08-26 DIAGNOSIS — M109 Gout, unspecified: Secondary | ICD-10-CM | POA: Insufficient documentation

## 2021-08-26 DIAGNOSIS — R7881 Bacteremia: Secondary | ICD-10-CM

## 2021-08-26 DIAGNOSIS — M25512 Pain in left shoulder: Secondary | ICD-10-CM

## 2021-08-26 DIAGNOSIS — M10079 Idiopathic gout, unspecified ankle and foot: Secondary | ICD-10-CM

## 2021-08-26 DIAGNOSIS — I330 Acute and subacute infective endocarditis: Secondary | ICD-10-CM

## 2021-08-26 DIAGNOSIS — M75100 Unspecified rotator cuff tear or rupture of unspecified shoulder, not specified as traumatic: Secondary | ICD-10-CM

## 2021-08-26 DIAGNOSIS — I442 Atrioventricular block, complete: Secondary | ICD-10-CM

## 2021-08-26 DIAGNOSIS — E559 Vitamin D deficiency, unspecified: Secondary | ICD-10-CM

## 2021-08-26 DIAGNOSIS — B9561 Methicillin susceptible Staphylococcus aureus infection as the cause of diseases classified elsewhere: Secondary | ICD-10-CM

## 2021-08-26 DIAGNOSIS — S46012D Strain of muscle(s) and tendon(s) of the rotator cuff of left shoulder, subsequent encounter: Secondary | ICD-10-CM

## 2021-08-26 HISTORY — DX: Unspecified rotator cuff tear or rupture of unspecified shoulder, not specified as traumatic: M75.100

## 2021-08-26 LAB — COMPREHENSIVE METABOLIC PANEL
ALT: 5 IU/L (ref 0–44)
AST: 14 IU/L (ref 0–40)
Albumin/Globulin Ratio: 0.9 — ABNORMAL LOW (ref 1.2–2.2)
Albumin: 3.8 g/dL (ref 3.8–4.9)
Alkaline Phosphatase: 85 IU/L (ref 44–121)
BUN/Creatinine Ratio: 18 (ref 9–20)
BUN: 14 mg/dL (ref 6–24)
Bilirubin Total: 0.5 mg/dL (ref 0.0–1.2)
CO2: 20 mmol/L (ref 20–29)
Calcium: 9.5 mg/dL (ref 8.7–10.2)
Chloride: 97 mmol/L (ref 96–106)
Creatinine, Ser: 0.76 mg/dL (ref 0.76–1.27)
Globulin, Total: 4.4 g/dL (ref 1.5–4.5)
Glucose: 174 mg/dL — ABNORMAL HIGH (ref 70–99)
Potassium: 4.5 mmol/L (ref 3.5–5.2)
Sodium: 137 mmol/L (ref 134–144)
Total Protein: 8.2 g/dL (ref 6.0–8.5)
eGFR: 104 mL/min/{1.73_m2} (ref >59)

## 2021-08-26 LAB — VITAMIN D 25 HYDROXY (VIT D DEFICIENCY, FRACTURES): Vit D, 25-Hydroxy: 14.6 ng/mL — ABNORMAL LOW (ref 30.0–100.0)

## 2021-08-26 LAB — IRON AND TIBC
Iron Saturation: 9 % — CL (ref 15–55)
Iron: 28 ug/dL — ABNORMAL LOW (ref 38–169)
Total Iron Binding Capacity: 321 ug/dL (ref 250–450)
UIBC: 293 ug/dL (ref 111–343)

## 2021-08-26 LAB — MICROALBUMIN / CREATININE URINE RATIO
Creatinine, Urine: 69.4 mg/dL
Microalb/Creat Ratio: 33 mg/g creat — ABNORMAL HIGH (ref 0–29)
Microalbumin, Urine: 23.1 ug/mL

## 2021-08-26 LAB — CBC WITH DIFFERENTIAL/PLATELET
Basophils Absolute: 0.1 10*3/uL (ref 0.0–0.2)
Basos: 1 %
EOS (ABSOLUTE): 0.2 10*3/uL (ref 0.0–0.4)
Eos: 2 %
Hematocrit: 31.8 % — ABNORMAL LOW (ref 37.5–51.0)
Hemoglobin: 10.1 g/dL — ABNORMAL LOW (ref 13.0–17.7)
Immature Grans (Abs): 0 10*3/uL (ref 0.0–0.1)
Immature Granulocytes: 0 %
Lymphocytes Absolute: 1.5 10*3/uL (ref 0.7–3.1)
Lymphs: 16 %
MCH: 27.9 pg (ref 26.6–33.0)
MCHC: 31.8 g/dL (ref 31.5–35.7)
MCV: 88 fL (ref 79–97)
Monocytes Absolute: 0.7 10*3/uL (ref 0.1–0.9)
Monocytes: 8 %
Neutrophils Absolute: 6.7 10*3/uL (ref 1.4–7.0)
Neutrophils: 73 %
Platelets: 242 10*3/uL (ref 150–450)
RBC: 3.62 x10E6/uL — ABNORMAL LOW (ref 4.14–5.80)
RDW: 14.4 % (ref 11.6–15.4)
WBC: 9.3 10*3/uL (ref 3.4–10.8)

## 2021-08-26 LAB — LIPID PANEL
Chol/HDL Ratio: 4 ratio (ref 0.0–5.0)
Cholesterol, Total: 135 mg/dL (ref 100–199)
HDL: 34 mg/dL — ABNORMAL LOW (ref >39)
LDL Chol Calc (NIH): 84 mg/dL (ref 0–99)
Triglycerides: 88 mg/dL (ref 0–149)
VLDL Cholesterol Cal: 17 mg/dL (ref 5–40)

## 2021-08-26 LAB — URIC ACID: Uric Acid: 5.9 mg/dL (ref 3.8–8.4)

## 2021-08-26 LAB — SEDIMENTATION RATE: Sed Rate: 102 mm/hr — ABNORMAL HIGH (ref 0–30)

## 2021-08-26 MED ORDER — VITAMIN D (ERGOCALCIFEROL) 1.25 MG (50000 UNIT) PO CAPS
50000.0000 [IU] | ORAL_CAPSULE | ORAL | 0 refills | Status: DC
Start: 2021-08-26 — End: 2021-11-23
  Filled 2021-08-26: qty 12, 84d supply, fill #0

## 2021-08-26 NOTE — Progress Notes (Signed)
Subjective:  Chief complaint pains in multiple joints consistent with gout attack still with going left shoulder pain   Patient ID: Joshua Cole, male    DOB: [birth year], 60 y.o.   MRN: [id number]  HPI  60 year old Caucasian man with DM, Gout who felt MSSA tricuspid valve endocarditis.  Had been considered for surgical intervention but was not felt to be a good operative candidate.  He was able to cure his blood cultures and has been on cefazolin which she completed on December 5.  He did have significant shoulder pain and had an MRI of the shoulder performed which showed:  A full-thickness tear of the supraspinatus tendon and high-grade articular tearing of the anterior fibers infra nidus tendon and mild subscapularis tendinosis with distention of the subacromial subdeltoid bursa with a peripheral enhancement and a area that could be blood versus potentially infection though radiology felt more likely the former.  His PICC line unfortunately does remain in place but fortunately does not become infected.  He has developed symptoms consistent with gout with pain in his left foot that now is migrated to his right first toe and right index finger.     Past Medical History:  Diagnosis Date   Arthritis    Diabetes mellitus without complication ([place])    DJD (degenerative joint disease)    Gout     Past Surgical History:  Procedure Laterality Date   BIOPSY  07/16/2021   Procedure: BIOPSY;  Surgeon: Sharyn Creamer, MD;  Location: [facility] ENDOSCOPY;  Service: Gastroenterology;;  EGD and COLON   COLONOSCOPY N/A 07/16/2021   Procedure: COLONOSCOPY;  Surgeon: Sharyn Creamer, MD;  Location: [place];  Service: Gastroenterology;  Laterality: N/A;   COLONOSCOPY WITH PROPOFOL N/A 07/16/2021   Procedure: COLONOSCOPY WITH PROPOFOL;  Surgeon: Sharyn Creamer, MD;  Location: [place];  Service: Gastroenterology;  Laterality: N/A;   ESOPHAGOGASTRODUODENOSCOPY (EGD) WITH PROPOFOL N/A  07/16/2021   Procedure: ESOPHAGOGASTRODUODENOSCOPY (EGD) WITH PROPOFOL;  Surgeon: Sharyn Creamer, MD;  Location: [place];  Service: Gastroenterology;  Laterality: N/A;   JOINT REPLACEMENT Bilateral    hip   POLYPECTOMY  07/16/2021   Procedure: POLYPECTOMY;  Surgeon: Sharyn Creamer, MD;  Location: [facility] ENDOSCOPY;  Service: Gastroenterology;;   TEE WITHOUT CARDIOVERSION N/A 07/07/2021   Procedure: TRANSESOPHAGEAL ECHOCARDIOGRAM (TEE);  Surgeon: Kate Sable, MD;  Location: [facility] ORS;  Service: Cardiovascular;  Laterality: N/A;    Family History  Problem Relation Age of Onset   Other Mother        unknown medical history   Lung cancer Father    Psoriasis Sister    Breast cancer Sister    Hypertension Brother    Hyperlipidemia Brother    Diabetes Brother    Heart attack Brother 56      Social History   Socioeconomic History   Marital status: Unknown    Spouse name: Not on file   Number of children: Not on file   Years of education: Not on file   Highest education level: Not on file  Occupational History   Not on file  Tobacco Use   Smoking status: Former    Packs/day: 0.25    Years: 15.00    Pack years: 3.75    Types: Cigarettes    Quit date: 2007    Years since quitting: 15.9   Smokeless tobacco: Never  Vaping Use   Vaping Use: Never used  Substance and Sexual Activity   Alcohol use: Not  Currently    Alcohol/week: 20.0 standard drinks    Types: 20 Cans of beer per week    Comment: last use 07/03/21 (hospital admission)   Drug use: Never   Sexual activity: Not on file  Other Topics Concern   Not on file  Social History Narrative   Not on file   Social Determinants of Health   Financial Resource Strain: Not on file  Food Insecurity: No Food Insecurity   Worried About Running Out of Food in the Last Year: Never true   Ran Out of Food in the Last Year: Never true  Transportation Needs: No Transportation Needs   Lack of Transportation (Medical): No    Lack of Transportation (Non-Medical): No  Physical Activity: Not on file  Stress: Not on file  Social Connections: Not on file    No Known Allergies   Current Outpatient Medications:    acetaminophen (TYLENOL) 325 MG tablet, Take 2 tablets (650 mg total) by mouth every 6 (six) hours as needed for mild pain or fever., Disp: , Rfl:    ferrous sulfate 325 (65 FE) MG tablet, Take 1 tablet (325 mg total) by mouth once daily with breakfast., Disp: 30 tablet, Rfl: 1   furosemide (LASIX) 20 MG tablet, Take 1 tablet (20 mg total) by mouth once daily as needed (swelling or weight gain)., Disp: 30 tablet, Rfl: 2   glucose blood (RIGHTEST GS550 BLOOD GLUCOSE) test strip, USE AS DIRECTED TO CHECK BLOOD SUGAR UP TO 4 TIMES DAILY., Disp: 100 strip, Rfl: 0   Insulin Glargine (BASAGLAR KWIKPEN) 100 UNIT/ML, Inject 25 Units into the skin once daily at bedtime., Disp: 15 mL, Rfl: 11   insulin lispro (HUMALOG) 100 UNIT/ML KwikPen, Inject 0-15 Units into the skin 3 (three) times daily by sliding scale instructions as directed., Disp: 15 mL, Rfl: 11   Insulin Pen Needle 32G X 4 MM MISC, Use as directed with insulin pens (4 times daily -  before meals and at bedtime)., Disp: 100 each, Rfl: 1   meloxicam (MOBIC) 7.5 MG tablet, Take 1 tablet (7.5 mg total) by mouth once daily., Disp: 30 tablet, Rfl: 0   Rightest GL300 Lancets MISC, USE AS DIRECTED TO CHECK BLOOD SUGAR UP TO 4 TIMES DAILY., Disp: 100 each, Rfl: 0   sitaGLIPtin (JANUVIA) 100 MG tablet, Take (1/2) tablet (50 mg total) by mouth once daily., Disp: 15 tablet, Rfl: 2   urea (URE-NA) 15 g PACK oral packet, Take 15 g by mouth once every Tuesday, Thursday, and Saturday at 6 PM., Disp: 18 packet, Rfl: 0    Review of Systems  Constitutional:  Negative for activity change, appetite change, chills, diaphoresis, fatigue, fever and unexpected weight change.  HENT:  Negative for congestion, rhinorrhea, sinus pressure, sneezing, sore throat and trouble swallowing.    Eyes:  Negative for photophobia and visual disturbance.  Respiratory:  Negative for cough, chest tightness, shortness of breath, wheezing and stridor.   Cardiovascular:  Negative for chest pain, palpitations and leg swelling.  Gastrointestinal:  Negative for abdominal distention, abdominal pain, anal bleeding, blood in stool, constipation, diarrhea, nausea and vomiting.  Genitourinary:  Negative for difficulty urinating, dysuria, flank pain and hematuria.  Musculoskeletal:  Positive for arthralgias and myalgias. Negative for back pain, gait problem and joint swelling.  Skin:  Negative for color change, pallor, rash and wound.  Neurological:  Negative for dizziness, tremors, weakness and light-headedness.  Hematological:  Negative for adenopathy. Does not bruise/bleed easily.  Psychiatric/Behavioral:  Negative  for agitation, behavioral problems, confusion, decreased concentration, dysphoric mood and sleep disturbance.       Objective:   Physical Exam Constitutional:      Appearance: He is well-developed.  HENT:     Head: Normocephalic and atraumatic.  Eyes:     Conjunctiva/sclera: Conjunctivae normal.  Cardiovascular:     Rate and Rhythm: Normal rate and regular rhythm.     Heart sounds: No murmur heard.   No friction rub. No gallop.  Pulmonary:     Effort: Pulmonary effort is normal. No respiratory distress.     Breath sounds: No stridor. No wheezing or rhonchi.  Abdominal:     General: There is no distension.     Palpations: Abdomen is soft.  Musculoskeletal:        General: Tenderness present.     Left shoulder: No swelling or bony tenderness. Decreased range of motion. Decreased strength.     Cervical back: Normal range of motion and neck supple.  Skin:    General: Skin is warm and dry.     Coloration: Skin is not pale.     Findings: No erythema or rash.  Neurological:     General: No focal deficit present.     Mental Status: He is alert and oriented to person, place, and  time.  Psychiatric:        Mood and Affect: Mood normal.        Behavior: Behavior normal.        Thought Content: Thought content normal.        Judgment: Judgment normal.     Tenderness of big toe on right and left finger     Assessment & Plan:   MSSA bacteremia and TV endocarditis:  We will pull PICC line today  I am checking blood cultures 2 weeks after antibiotics did not set up a lab visit for him to have this done.    Shoulder pain with multiple tendon tears 1 fluid collection:  Has been referred to orthopedic surgery.  IF the shoulder ends up appearing c/w infection would want intra-operative cultures and would expect this to be due to his MSSA   Gout: agree with continuing treatment for this.  I spent 42 minutes with the patient including than 50% of the time in face to face counseling of the patient  re his bacteremia endocarditis shoulder pain gout, personally reviewing MRI of the shoulder, cultures from his hospital stay along with review of medical records in preparation for the visit and during the visit and in coordination of his care.

## 2021-08-26 NOTE — Progress Notes (Signed)
Per verbal order from Dr. Tommy Medal, 41 cm PICC removed from right basilic, tip intact. No sutures present. RN confirmed length per chart. Dressing was clean and dry. Insertion site cleaned with CHG. Petroleum dressing applied. Patient advised no heavy lifting with this arm and to leave dressing in place for 24 hours and not to shower affected arm for 24 hours. Advised patient to seek emergency medical care if dressing becomes soaked with blood, swelling, or sharp pain presents. Advised patient to seek emergent care if develops neurological symptoms, chest pain, or shortness of breath. Instructed patient to notify office if they notice redness, warmth, or drainage at the site. Patient verbalized understanding and agreement. RN answered patient's questions. Patient tolerated procedure well and RN walked patient to check out. RN notified [facility] of removal.    Beryle Flock, RN

## 2021-08-30 ENCOUNTER — Other Ambulatory Visit: Payer: Self-pay

## 2021-09-02 ENCOUNTER — Other Ambulatory Visit: Payer: Self-pay

## 2021-09-02 DIAGNOSIS — I330 Acute and subacute infective endocarditis: Secondary | ICD-10-CM

## 2021-09-02 DIAGNOSIS — R7881 Bacteremia: Secondary | ICD-10-CM

## 2021-09-08 LAB — CULTURE, BLOOD (SINGLE)
MICRO NUMBER:: 12791779
MICRO NUMBER:: 12791780
Result:: NO GROWTH
Result:: NO GROWTH
SPECIMEN QUALITY:: ADEQUATE
SPECIMEN QUALITY:: ADEQUATE

## 2021-09-19 NOTE — Progress Notes (Signed)
Cardiology Office Note:    Date:  09/20/2021   ID:  Joshua Cole, DOB 1961-05-11, MRN [id number]  PCP:  Joshua Reusing, Joshua Cole  [facility] Cardiologist:  Joshua Sacramento, MD  [facility] Electrophysiologist:  None   Referring MD: Joshua Reusing, Joshua Cole   Chief Complaint: acute visit  History of Present Illness:    Joshua Cole is a 61 y.o. male with a hx of gout, alcohol use with recent admission.   Presened with generalized weakness, bilateral hip pain who was initially admitted at [facility]. Blood work showed MSSA bacteremia. CT angiogram negative for PE, then TEE was positive for tricuspid valve vegetation. During hospital stay at [facility], patient developed third degee AV block 10/26 and was transferred to [facility] for further management.   CT surgery evaluated for possible TR placement. CT surgery, recommended conservative management and no surgery. Required 2units prBCS. Underwent EGD and colonoscopy. Discharged on abx for endocarditis.  He was seen in the office by Joshua Cole with infectious disease on 08/26/2021.  His PICC line was removed without issue.  Blood cultures x2 were drawn which were negative  Saw Dr. Quentin Cole 08/25/21 and was in accelerated juncitonal rhythm, no syncope or presyncope. Would like to wait on permament pacemaker implant as long as possible given history of endocarditis.   Today, he has been having about 2 weeks of fatigue and malaise.  He does have some chest pressure on the left which happens randomly but more so when he sneezes or coughs.  He has been sneezing quite a bit due to allergies.  Occasionally he has felt winded while in the grocery store.  He has tried to maintain a level of physical activity however he is limited by orthopedic injuries.  He has been working on maintaining a heart healthy diet.  We reviewed his most recent studies including his echocardiogram and CT scan from his hospitalization.  Since he was on the appropriate IV antibiotic  therapy for his endocarditis and his PICC line was recently removed a few weeks ago by infectious disease we will go ahead and order a TTE to reevaluate his tricuspid valve endocarditis.  Also there was a 13 mm right upper lobe nodule that was found incidentally on a CT scan to rule out pulmonary embolism.  Would suggest follow-up with CT surgery for ongoing surveillance.  Due to his fatigue which is new in the last few weeks we will order a follow-up CBC and CMP.  He was anemic when he was in the hospital and he had some hyponatremia.  We will also be able to check a white blood cell count to ensure there is no ongoing infection.  Once TTE is completed he will then be referred back to Dr. Quentin Cole for discussion of permanent pacemaker.  No edema, orthopnea, PND. Reports no palpitations.    Past Medical History:  Diagnosis Date   Arthritis    Diabetes mellitus without complication (HCC)    DJD (degenerative joint disease)    Gout    Rotator cuff tear 08/26/2021    Past Surgical History:  Procedure Laterality Date   BIOPSY  07/16/2021   Procedure: BIOPSY;  Surgeon: Joshua Creamer, MD;  Location: [facility] ENDOSCOPY;  Service: Gastroenterology;;  EGD and COLON   COLONOSCOPY N/A 07/16/2021   Procedure: COLONOSCOPY;  Surgeon: Joshua Creamer, MD;  Location: [place];  Service: Gastroenterology;  Laterality: N/A;   COLONOSCOPY WITH PROPOFOL N/A 07/16/2021   Procedure: COLONOSCOPY WITH PROPOFOL;  Surgeon: Joshua Creamer, MD;  Location: [place];  Service: Gastroenterology;  Laterality: N/A;   ESOPHAGOGASTRODUODENOSCOPY (EGD) WITH PROPOFOL N/A 07/16/2021   Procedure: ESOPHAGOGASTRODUODENOSCOPY (EGD) WITH PROPOFOL;  Surgeon: Joshua Creamer, MD;  Location: [place];  Service: Gastroenterology;  Laterality: N/A;   JOINT REPLACEMENT Bilateral    hip   POLYPECTOMY  07/16/2021   Procedure: POLYPECTOMY;  Surgeon: Joshua Creamer, MD;  Location: [facility] ENDOSCOPY;  Service: Gastroenterology;;   TEE WITHOUT  CARDIOVERSION N/A 07/07/2021   Procedure: TRANSESOPHAGEAL ECHOCARDIOGRAM (TEE);  Surgeon: Joshua Sable, MD;  Location: [facility] ORS;  Service: Cardiovascular;  Laterality: N/A;    Current Medications: Current Meds  Medication Sig   acetaminophen (TYLENOL) 325 MG tablet Take 2 tablets (650 mg total) by mouth every 6 (six) hours as needed for mild pain or fever.   ferrous sulfate 325 (65 FE) MG tablet Take 1 tablet (325 mg total) by mouth once daily with breakfast.   furosemide (LASIX) 20 MG tablet Take 1 tablet (20 mg total) by mouth once daily as needed (swelling or weight gain).   glucose blood (RIGHTEST GS550 BLOOD GLUCOSE) test strip USE AS DIRECTED TO CHECK BLOOD SUGAR UP TO 4 TIMES DAILY.   Insulin Glargine (BASAGLAR KWIKPEN) 100 UNIT/ML Inject 25 Units into the skin once daily at bedtime.   insulin lispro (HUMALOG) 100 UNIT/ML KwikPen Inject 0-15 Units into the skin 3 (three) times daily by sliding scale instructions as directed.   Insulin Pen Needle 32G X 4 MM MISC Use as directed with insulin pens (4 times daily -  before meals and at bedtime).   meloxicam (MOBIC) 7.5 MG tablet Take 1 tablet (7.5 mg total) by mouth once daily.   Rightest GL300 Lancets MISC USE AS DIRECTED TO CHECK BLOOD SUGAR UP TO 4 TIMES DAILY.   sitaGLIPtin (JANUVIA) 100 MG tablet Take (1/2) tablet (50 mg total) by mouth once daily.   Vitamin D, Ergocalciferol, (DRISDOL) 1.25 MG (50000 UNIT) CAPS capsule Take 1 capsule (50,000 Units total) by mouth once every 7 (seven) days.     Allergies:   Patient has no known allergies.   Social History   Socioeconomic History   Marital status: Unknown    Spouse name: Not on file   Number of children: Not on file   Years of education: Not on file   Highest education level: Not on file  Occupational History   Not on file  Tobacco Use   Smoking status: Former    Packs/day: 0.25    Years: 15.00    Pack years: 3.75    Types: Cigarettes    Quit date: 2007    Years  since quitting: 16.0   Smokeless tobacco: Never  Vaping Use   Vaping Use: Never used  Substance and Sexual Activity   Alcohol use: Not Currently    Alcohol/week: 20.0 standard drinks    Types: 20 Cans of beer per week    Comment: last use 07/03/21 (hospital admission)   Drug use: Never   Sexual activity: Not on file  Other Topics Concern   Not on file  Social History Narrative   Not on file   Social Determinants of Health   Financial Resource Strain: Not on file  Food Insecurity: No Food Insecurity   Worried About [place] in the Last Year: Never true   [place] in the Last Year: Never true  Transportation Needs: No Transportation Needs   Lack of Transportation (Medical):  No   Lack of Transportation (Non-Medical): No  Physical Activity: Not on file  Stress: Not on file  Social Connections: Not on file     Family History: The patient's family history includes Breast cancer in his sister; Diabetes in his brother; Heart attack (age of onset: 70) in his brother; Hyperlipidemia in his brother; Hypertension in his brother; Lung cancer in his father; Other in his mother; Psoriasis in his sister.  ROS:   Please see the history of present illness.     All other systems reviewed and are negative.  EKGs/Labs/Other Studies Reviewed:    The following studies were reviewed today:  Echocardiogram (TEE) 07/07/2021  IMPRESSIONS     1. Left ventricular ejection fraction, by estimation, is 55 to 60%. The  left ventricle has normal function.   2. Right ventricular systolic function is normal. The right ventricular  size is normal.   3. No left atrial/left atrial appendage thrombus was detected.   4. The mitral valve is normal in structure. Mild mitral valve  regurgitation.   5. There is a mobile mass attached to the tricuspid valve (clip 132)  consistent with a vegetation given the current clinical context.. The  tricuspid valve is degenerative.   6. The aortic  valve is tricuspid. Aortic valve regurgitation is not  visualized.   Conclusion(s)/Recommendation(s): Findings are concerning for  vegetation/infective endocarditis as detailed above.   FINDINGS   Left Ventricle: Left ventricular ejection fraction, by estimation, is 55  to 60%. The left ventricle has normal function. The left ventricular  internal cavity size was normal in size.   Right Ventricle: The right ventricular size is normal. No increase in  right ventricular wall thickness. Right ventricular systolic function is  normal.   Left Atrium: Left atrial size was normal in size. No left atrial/left  atrial appendage thrombus was detected.   Right Atrium: Right atrial size was normal in size.   Pericardium: There is no evidence of pericardial effusion.   Mitral Valve: The mitral valve is normal in structure. Mild mitral valve  regurgitation.   Tricuspid Valve: There is a mobile mass attached to the tricuspid valve  (clip 132) consistent with a vegetation given the current clinical context. The tricuspid valve is degenerative in appearance. Tricuspid valve regurgitation is trivial.   Aortic Valve: The aortic valve is tricuspid. Aortic valve regurgitation is  not visualized.   Pulmonic Valve: The pulmonic valve was normal in structure. Pulmonic valve  regurgitation is not visualized.   Aorta: The aortic root is normal in size and structure.   IAS/Shunts: No atrial level shunt detected by color flow Doppler.   EKG:  EKG is  ordered today.  The ekg ordered today demonstrates sinus tachycardia with first-degree AV block, consistent with previous EKGs  Recent Labs: 07/05/2021: B Natriuretic Peptide 219.6 07/07/2021: TSH 0.162 07/20/2021: Magnesium 2.2 08/25/2021: ALT 5; BUN 14; Creatinine, Ser 0.76; Hemoglobin 10.1; Platelets 242; Potassium 4.5; Sodium 137  Recent Lipid Panel    Component Value Date/Time   CHOL 135 08/25/2021 1042   TRIG 88 08/25/2021 1042   HDL 34 (L)  08/25/2021 1042   CHOLHDL 4.0 08/25/2021 1042   CHOLHDL NOT CALCULATED 07/08/2021 0628   VLDL 20 07/08/2021 0628   LDLCALC 84 08/25/2021 1042      Physical Exam:    VS:  BP 124/70 (BP Location: Left Arm, Patient Position: Sitting, Cuff Size: Normal)    Pulse (!) 107    Ht 5\' 10"  (1.778  m)    Wt 231 lb (104.8 kg)    SpO2 98%    BMI 33.15 kg/m     Wt Readings from Last 3 Encounters:  09/20/21 231 lb (104.8 kg)  08/26/21 234 lb (106.1 kg)  08/25/21 233 lb 12.8 oz (106.1 kg)     GEN:  Well nourished, well developed in no acute distress HEENT: Normal NECK: No JVD; No carotid bruits LYMPHATICS: No lymphadenopathy CARDIAC: sinus tachycardia, no murmurs, rubs, gallops RESPIRATORY:  Clear to auscultation without rales, wheezing or rhonchi  ABDOMEN: Soft, non-tender, non-distended MUSCULOSKELETAL:  No edema; No deformity  SKIN: Warm and dry NEUROLOGIC:  Alert and oriented x 3 PSYCHIATRIC:  Normal affect   ASSESSMENT:    1. Heart block AV complete ([place])   2. Acute bacterial endocarditis   3. Type 2 diabetes mellitus without complication, with long-term current use of insulin (HCC)    PLAN:    In order of problems listed above:  CHB  -Currently being worked up for permanent pacemaker by Dr. Quentin Cole -Per Dr. Mardene Speak last note would hold off for now due to ongoing endocarditis of the tricuspid valve  MSSA bacteremia -Last seen by infectious disease 08/26/2021, PICC line removed -Blood cultures drawn and were negative x2 - Ordered a TTE today for this week to evaluate TR endocarditis  Type 2 diabetes mellitus -Last hemoglobin A1c October 2022 was 8.0 -He is insulin-dependent -Would recommend close follow-up with PCP for better blood glucose control  4. Fatigue/Malaise -Will order repeat CBC and CMP -Previously was anemic and now on iron supplements -Previously had hyponatremia and was on URE-NA which the patient discontinued on his own  5. 13 mm RUL Lung nodule   -Found on CT accidentally when r/o PE -Refer to CT surgery for continued surveillance    Disposition: Follow up in 2 months with Joshua Bayley, Joshua Cole    Signed, Joshua Collard, Joshua Cole  09/20/2021 11:27 AM    [facility]

## 2021-09-20 ENCOUNTER — Ambulatory Visit (INDEPENDENT_AMBULATORY_CARE_PROVIDER_SITE_OTHER): Payer: Self-pay | Admitting: Physician Assistant

## 2021-09-20 ENCOUNTER — Encounter: Payer: Self-pay | Admitting: Medical

## 2021-09-20 ENCOUNTER — Other Ambulatory Visit: Payer: Self-pay

## 2021-09-20 VITALS — BP 124/70 | HR 107 | Ht 70.0 in | Wt 231.0 lb

## 2021-09-20 DIAGNOSIS — E119 Type 2 diabetes mellitus without complications: Secondary | ICD-10-CM

## 2021-09-20 DIAGNOSIS — R5383 Other fatigue: Secondary | ICD-10-CM

## 2021-09-20 DIAGNOSIS — R911 Solitary pulmonary nodule: Secondary | ICD-10-CM

## 2021-09-20 DIAGNOSIS — Z794 Long term (current) use of insulin: Secondary | ICD-10-CM

## 2021-09-20 DIAGNOSIS — I330 Acute and subacute infective endocarditis: Secondary | ICD-10-CM

## 2021-09-20 DIAGNOSIS — I442 Atrioventricular block, complete: Secondary | ICD-10-CM

## 2021-09-20 NOTE — Patient Instructions (Addendum)
Medication Instructions:  Your physician recommends that you continue on your current medications as directed. Please refer to the Current Medication list given to you today.   *If you need a refill on your cardiac medications before your next appointment, please call your pharmacy*   Lab Work:  Your provider has ordered lab work (CBC, CMP) to be drawn before you leave today.   If you have labs (blood work) drawn today and your tests are completely normal, you will receive your results only by: Dana (if you have MyChart) OR A paper copy in the mail If you have any lab test that is abnormal or we need to change your treatment, we will call you to review the results.   Testing/Procedures: Echocardiogram - Your physician has requested that you have an echocardiogram. Echocardiography is a painless test that uses sound waves to create images of your heart. It provides your doctor with information about the size and shape of your heart and how well your hearts chambers and valves are working. This procedure takes approximately one hour. There are no restrictions for this procedure. This will be performed at either our [facility] location - [address] [place] location [facility] 2nd floor.    Follow-Up: At [facility], you and your health needs are our priority.  As part of our continuing mission to provide you with exceptional heart care, we have created designated Provider Care Teams.  These Care Teams include your primary Cardiologist (physician) and Advanced Practice Providers (APPs -  Physician Assistants and Nurse Practitioners) who all work together to provide you with the care you need, when you need it.  We recommend signing up for the patient portal called "MyChart".  Sign up information is provided on this After Visit Summary.  MyChart is used to connect with patients for Virtual Visits (Telemedicine).  Patients are able to view  lab/test results, encounter notes, upcoming appointments, etc.  Non-urgent messages can be sent to your provider as well.   To learn more about what you can do with MyChart, go to [email or web address].    Your next appointment:   As scheduled  The format for your next appointment:   In Person  Provider:   You may see Kathlyn Sacramento, MD or one of the following Advanced Practice Providers on your designated Care Team:   [name] Hodgkins, NP Christell Faith, PA-C Cadence Kathlen Mody, PA-C:1}    Other Instructions A referral has been sent to cardiothoracic surgery regarding your lung nodule. They will contact you to set up this appointment

## 2021-09-21 ENCOUNTER — Other Ambulatory Visit: Payer: Self-pay | Admitting: Gerontology

## 2021-09-21 ENCOUNTER — Other Ambulatory Visit: Payer: Self-pay

## 2021-09-21 DIAGNOSIS — M25512 Pain in left shoulder: Secondary | ICD-10-CM

## 2021-09-21 LAB — COMPREHENSIVE METABOLIC PANEL
ALT: 2 IU/L (ref 0–44)
AST: 13 IU/L (ref 0–40)
Albumin/Globulin Ratio: 0.9 — ABNORMAL LOW (ref 1.2–2.2)
Albumin: 4.2 g/dL (ref 3.8–4.9)
Alkaline Phosphatase: 90 IU/L (ref 44–121)
BUN/Creatinine Ratio: 15 (ref 9–20)
BUN: 12 mg/dL (ref 6–24)
Bilirubin Total: 0.5 mg/dL (ref 0.0–1.2)
CO2: 24 mmol/L (ref 20–29)
Calcium: 9.7 mg/dL (ref 8.7–10.2)
Chloride: 103 mmol/L (ref 96–106)
Creatinine, Ser: 0.82 mg/dL (ref 0.76–1.27)
Globulin, Total: 4.7 g/dL — ABNORMAL HIGH (ref 1.5–4.5)
Glucose: 146 mg/dL — ABNORMAL HIGH (ref 70–99)
Potassium: 4.4 mmol/L (ref 3.5–5.2)
Sodium: 139 mmol/L (ref 134–144)
Total Protein: 8.9 g/dL — ABNORMAL HIGH (ref 6.0–8.5)
eGFR: 101 mL/min/{1.73_m2} (ref >59)

## 2021-09-21 LAB — CBC
Hematocrit: 37 % — ABNORMAL LOW (ref 37.5–51.0)
Hemoglobin: 11.7 g/dL — ABNORMAL LOW (ref 13.0–17.7)
MCH: 27.3 pg (ref 26.6–33.0)
MCHC: 31.6 g/dL (ref 31.5–35.7)
MCV: 86 fL (ref 79–97)
Platelets: 139 10*3/uL — ABNORMAL LOW (ref 150–450)
RBC: 4.28 x10E6/uL (ref 4.14–5.80)
RDW: 15.3 % (ref 11.6–15.4)
WBC: 10.2 10*3/uL (ref 3.4–10.8)

## 2021-09-21 MED ORDER — MELOXICAM 7.5 MG PO TABS
7.5000 mg | ORAL_TABLET | Freq: Every day | ORAL | 0 refills | Status: DC
  Filled 2021-09-21: qty 30, 30d supply, fill #0

## 2021-09-22 ENCOUNTER — Other Ambulatory Visit: Payer: Self-pay

## 2021-09-24 ENCOUNTER — Telehealth: Payer: Self-pay

## 2021-09-24 ENCOUNTER — Other Ambulatory Visit: Payer: Self-pay | Admitting: Physician Assistant

## 2021-09-24 DIAGNOSIS — R911 Solitary pulmonary nodule: Secondary | ICD-10-CM

## 2021-09-24 NOTE — Telephone Encounter (Signed)
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 Kipp Brood.  Mf. Callagahn verbalized understanding and agrees for CT scan.   Appt 10:30am Friday 10/08/21  Will route staff message back to [place], PA-C to let her know of date and time.

## 2021-09-24 NOTE — Progress Notes (Signed)
°  ° °  I ordered the CT chest for this patient and referral was made to TCTS. Thought to not actually be a lung nodule but possible septic emboli from his endocarditis.   I will route my note to triage so the patient can be updated.   Nicholes Rough, PA-C

## 2021-09-27 ENCOUNTER — Other Ambulatory Visit: Payer: Self-pay

## 2021-09-28 ENCOUNTER — Other Ambulatory Visit: Payer: Self-pay

## 2021-09-28 MED ORDER — INSULIN PEN NEEDLE 32G X 4 MM MISC
99 refills | Status: DC
  Filled 2021-09-28: qty 100, 100d supply, fill #0
  Filled 2021-12-07: qty 100, 28d supply, fill #1
  Filled 2021-12-08: qty 100, 100d supply, fill #1
  Filled 2022-04-12: qty 100, 30d supply, fill #0
  Filled 2022-07-13: qty 100, 30d supply, fill #1

## 2021-09-29 ENCOUNTER — Other Ambulatory Visit: Payer: Self-pay

## 2021-10-05 ENCOUNTER — Other Ambulatory Visit: Payer: Self-pay

## 2021-10-05 ENCOUNTER — Ambulatory Visit: Payer: Self-pay | Admitting: Gerontology

## 2021-10-05 ENCOUNTER — Encounter: Payer: Self-pay | Admitting: Gerontology

## 2021-10-05 VITALS — BP 137/87 | HR 102 | Temp 97.4°F | Resp 18 | Ht 70.0 in | Wt 228.5 lb

## 2021-10-05 DIAGNOSIS — E119 Type 2 diabetes mellitus without complications: Secondary | ICD-10-CM

## 2021-10-05 DIAGNOSIS — G629 Polyneuropathy, unspecified: Secondary | ICD-10-CM | POA: Insufficient documentation

## 2021-10-05 LAB — GLUCOSE, POCT (MANUAL RESULT ENTRY): POC Glucose: 183 mg/dl — AB (ref 70–99)

## 2021-10-05 LAB — POCT GLYCOSYLATED HEMOGLOBIN (HGB A1C): Hemoglobin A1C: 6.1 % — AB (ref 4.0–5.6)

## 2021-10-05 MED ORDER — BASAGLAR KWIKPEN 100 UNIT/ML ~~LOC~~ SOPN
15.0000 [IU] | PEN_INJECTOR | Freq: Every day | SUBCUTANEOUS | 11 refills | Status: DC
  Filled 2021-10-05 – 2022-02-10 (×2): qty 15, 100d supply, fill #0
  Filled 2022-05-24 – 2022-07-13 (×2): qty 15, 100d supply, fill #1

## 2021-10-05 MED ORDER — GABAPENTIN 100 MG PO CAPS
100.0000 mg | ORAL_CAPSULE | Freq: Two times a day (BID) | ORAL | 0 refills | Status: DC
  Filled 2021-10-05: qty 60, 30d supply, fill #0

## 2021-10-05 NOTE — Progress Notes (Signed)
Established Patient Office Visit  Subjective:  Patient ID: Joshua Cole, male    DOB: 07/13/1961  Age: 61 y.o. MRN: [id number]  CC:  Chief Complaint  Patient presents with   Follow-up    Labs drawn 08/25/21    HPI Joshua Cole is a 61 year old male who has history of arthritis, type 2 diabetes, DJD, Gout,presents for lab review. He states that he's compliant with his medications, denies side effects and continues to make healthy lifestyle changes. His HgbA1c done during visit decreased from 8% to 6.1%, checks his blood glucose tid, fasting reading was 121 mg/dl. He denies hypo/hyperglycemic symptoms and performs daily foot checks. He c/o constant peripheral neuropathy to bilateral thigh that has been going on for quite sometime.  He rates discomfort as 7/10 and taking tylenol 500 mg qid with minimal relief. He states that he has to rub his thighs when he wakes up in the morning. He denies muscle nor motor weakness. He was seen at the Cardiology clinic on 09/20/21 by Elgie Collard PA-C, he states that his energy level has improved 60%, continues to experience intermittent pain to his left chest area when he sneezes or taking a deep breath. He will follow up for Chest CT to evaluate 13 mm nodule to his right upper lobe on 10/08/21. Overall, he state that he's doing well and offers no further complaint.     Past Medical History:  Diagnosis Date   Arthritis    Diabetes mellitus without complication (HCC)    DJD (degenerative joint disease)    Gout    Rotator cuff tear 08/26/2021    Past Surgical History:  Procedure Laterality Date   BIOPSY  07/16/2021   Procedure: BIOPSY;  Surgeon: Sharyn Creamer, MD;  Location: [facility] ENDOSCOPY;  Service: Gastroenterology;;  EGD and COLON   COLONOSCOPY N/A 07/16/2021   Procedure: COLONOSCOPY;  Surgeon: Sharyn Creamer, MD;  Location: [place];  Service: Gastroenterology;  Laterality: N/A;   COLONOSCOPY WITH PROPOFOL N/A 07/16/2021   Procedure:  COLONOSCOPY WITH PROPOFOL;  Surgeon: Sharyn Creamer, MD;  Location: [place];  Service: Gastroenterology;  Laterality: N/A;   ESOPHAGOGASTRODUODENOSCOPY (EGD) WITH PROPOFOL N/A 07/16/2021   Procedure: ESOPHAGOGASTRODUODENOSCOPY (EGD) WITH PROPOFOL;  Surgeon: Sharyn Creamer, MD;  Location: [place];  Service: Gastroenterology;  Laterality: N/A;   JOINT REPLACEMENT Bilateral    hip   POLYPECTOMY  07/16/2021   Procedure: POLYPECTOMY;  Surgeon: Sharyn Creamer, MD;  Location: [facility] ENDOSCOPY;  Service: Gastroenterology;;   TEE WITHOUT CARDIOVERSION N/A 07/07/2021   Procedure: TRANSESOPHAGEAL ECHOCARDIOGRAM (TEE);  Surgeon: Kate Sable, MD;  Location: [facility] ORS;  Service: Cardiovascular;  Laterality: N/A;    Family History  Problem Relation Age of Onset   Other Mother        unknown medical history   Lung cancer Father    Psoriasis Sister    Breast cancer Sister    Hypertension Brother    Hyperlipidemia Brother    Diabetes Brother    Heart attack Brother 57    Social History   Socioeconomic History   Marital status: Unknown    Spouse name: Not on file   Number of children: Not on file   Years of education: Not on file   Highest education level: Not on file  Occupational History   Not on file  Tobacco Use   Smoking status: Former    Packs/day: 0.25    Years: 15.00    Pack years: 3.75  Types: Cigarettes    Quit date: 2007    Years since quitting: 16.0   Smokeless tobacco: Never  Vaping Use   Vaping Use: Never used  Substance and Sexual Activity   Alcohol use: Yes    Alcohol/week: 6.0 standard drinks    Types: 6 Glasses of wine per week    Comment: last use 07/03/21 (hospital admission) previous 20 beers per week   Drug use: Never   Sexual activity: Not on file  Other Topics Concern   Not on file  Social History Narrative   Not on file   Social Determinants of Health   Financial Resource Strain: Not on file  Food Insecurity: No Food Insecurity    Worried About Running Out of Food in the Last Year: Never true   Ran Out of Food in the Last Year: Never true  Transportation Needs: No Transportation Needs   Lack of Transportation (Medical): No   Lack of Transportation (Non-Medical): No  Physical Activity: Not on file  Stress: Not on file  Social Connections: Not on file  Intimate Partner Violence: Not on file    Outpatient Medications Prior to Visit  Medication Sig Dispense Refill   acetaminophen (TYLENOL) 325 MG tablet Take 2 tablets (650 mg total) by mouth every 6 (six) hours as needed for mild pain or fever.     ferrous sulfate 325 (65 FE) MG tablet Take 1 tablet (325 mg total) by mouth once daily with breakfast. 30 tablet 1   furosemide (LASIX) 20 MG tablet Take 1 tablet (20 mg total) by mouth once daily as needed (swelling or weight gain). 30 tablet 2   glucose blood (RIGHTEST GS550 BLOOD GLUCOSE) test strip USE AS DIRECTED TO CHECK BLOOD SUGAR UP TO 4 TIMES DAILY. 100 strip 0   insulin lispro (HUMALOG) 100 UNIT/ML KwikPen Inject 0-15 Units into the skin 3 (three) times daily by sliding scale instructions as directed. 15 mL 11   Insulin Pen Needle 32G X 4 MM MISC USE AS DIRECTED 100 each PRN   meloxicam (MOBIC) 7.5 MG tablet Take 1 tablet (7.5 mg total) by mouth once daily. 30 tablet 0   Rightest GL300 Lancets MISC USE AS DIRECTED TO CHECK BLOOD SUGAR UP TO 4 TIMES DAILY. 100 each 0   sitaGLIPtin (JANUVIA) 100 MG tablet Take (1/2) tablet (50 mg total) by mouth once daily. 15 tablet 2   Vitamin D, Ergocalciferol, (DRISDOL) 1.25 MG (50000 UNIT) CAPS capsule Take 1 capsule (50,000 Units total) by mouth once every 7 (seven) days. 12 capsule 0   Insulin Glargine (BASAGLAR KWIKPEN) 100 UNIT/ML Inject 25 Units into the skin once daily at bedtime. 15 mL 11   urea (URE-NA) 15 g PACK oral packet Take 15 g by mouth once every Tuesday, Thursday, and Saturday at 6 PM. (Patient not taking: Reported on 09/20/2021) 18 packet 0   No  facility-administered medications prior to visit.    No Known Allergies  ROS Review of Systems  Constitutional: Negative.   HENT: Negative.    Eyes: Negative.   Respiratory: Negative.    Cardiovascular: Negative.   Endocrine: Negative.   Skin: Negative.   Neurological:  Positive for numbness (bilateral upper thigh).  Psychiatric/Behavioral: Negative.       Objective:    Physical Exam HENT:     Head: Normocephalic and atraumatic.  Cardiovascular:     Rate and Rhythm: Normal rate and regular rhythm.     Pulses: Normal pulses.  Heart sounds: Normal heart sounds.  Pulmonary:     Effort: Pulmonary effort is normal.     Breath sounds: Normal breath sounds.  Musculoskeletal:        General: Tenderness (palpation to bilateral thigh) present.  Skin:    General: Skin is warm.  Neurological:     General: No focal deficit present.     Mental Status: He is alert and oriented to person, place, and time. Mental status is at baseline.  Psychiatric:        Mood and Affect: Mood normal.        Behavior: Behavior normal.        Thought Content: Thought content normal.        Judgment: Judgment normal.    BP 137/87 (BP Location: Right Arm, Patient Position: Sitting, Cuff Size: Large)    Pulse (!) 102    Temp (!) 97.4 F (36.3 C) (Oral)    Resp 18    Ht _0  (1.778 m)    Wt 228 lb 8 oz (103.6 kg)    SpO2 98%    BMI 32.79 kg/m  Wt Readings from Last 3 Encounters:  10/05/21 228 lb 8 oz (103.6 kg)  09/20/21 231 lb (104.8 kg)  08/26/21 234 lb (106.1 kg)   Encouraged weight loss  Health Maintenance Due  Topic Date Due   COVID-19 Vaccine (1) Never done   FOOT EXAM  Never done   OPHTHALMOLOGY EXAM  Never done   TETANUS/TDAP  Never done   Zoster Vaccines- Shingrix (1 of 2) Never done    There are no preventive care reminders to display for this patient.  Lab Results  Component Value Date   TSH 0.162 (L) 07/07/2021   Lab Results  Component Value Date   WBC 10.2  09/20/2021   HGB 11.7 (L) 09/20/2021   HCT 37.0 (L) 09/20/2021   MCV 86 09/20/2021   PLT 139 (L) 09/20/2021   Lab Results  Component Value Date   NA 139 09/20/2021   K 4.4 09/20/2021   CO2 24 09/20/2021   GLUCOSE 146 (H) 09/20/2021   BUN 12 09/20/2021   CREATININE 0.82 09/20/2021   BILITOT 0.5 09/20/2021   ALKPHOS 90 09/20/2021   AST 13 09/20/2021   ALT 2 09/20/2021   PROT 8.9 (H) 09/20/2021   ALBUMIN 4.2 09/20/2021   CALCIUM 9.7 09/20/2021   ANIONGAP 8 07/20/2021   EGFR 101 09/20/2021   Lab Results  Component Value Date   CHOL 135 08/25/2021   Lab Results  Component Value Date   HDL 34 (L) 08/25/2021   Lab Results  Component Value Date   LDLCALC 84 08/25/2021   Lab Results  Component Value Date   TRIG 88 08/25/2021   Lab Results  Component Value Date   CHOLHDL 4.0 08/25/2021   Lab Results  Component Value Date   HGBA1C 6.1 (A) 10/05/2021      Assessment & Plan:    1. Type 2 diabetes mellitus without complication, with long-term current use of insulin (HCC) -His HgbA1c was 6.1%, his diabetes has improved, Basaglar was decreased to 15 units at bedtime, advised to check blood glucose tid, record and bring log to follow up appointment. He was encouraged to continue on low carb/ non concentrated sweet diet and exercise as tolerated. - POCT HgB A1C; Future - POCT Glucose (CBG); Future - POCT Glucose (CBG) - POCT HgB A1C - Insulin Glargine (BASAGLAR KWIKPEN) 100 UNIT/ML; Inject 15 Units into the  skin once daily at bedtime.  Dispense: 15 mL; Refill: 11  2. Peripheral polyneuropathy - He was started on gabapentin 100 mg bid, educated on side effects and advised to notify clinic. - gabapentin (NEURONTIN) 100 MG capsule; Take 1 capsule (100 mg total) by mouth 2 (two) times daily.  Dispense: 60 capsule; Refill: 0     Follow-up: Return in about 4 weeks (around 11/02/2021), or if symptoms worsen or fail to improve.    Yazleemar Strassner Jerold Coombe, NP

## 2021-10-07 ENCOUNTER — Ambulatory Visit (INDEPENDENT_AMBULATORY_CARE_PROVIDER_SITE_OTHER): Payer: Self-pay

## 2021-10-07 ENCOUNTER — Other Ambulatory Visit: Payer: Self-pay

## 2021-10-07 DIAGNOSIS — I330 Acute and subacute infective endocarditis: Secondary | ICD-10-CM

## 2021-10-07 LAB — ECHOCARDIOGRAM COMPLETE
AR max vel: 2.4 cm2
AV Area VTI: 2.66 cm2
AV Area mean vel: 2.28 cm2
AV Mean grad: 4 mmHg
AV Peak grad: 6.6 mmHg
Ao pk vel: 1.28 m/s
S' Lateral: 3.2 cm

## 2021-10-07 MED ORDER — PERFLUTREN LIPID MICROSPHERE
1.0000 mL | INTRAVENOUS | Status: AC | PRN
  Administered 2021-10-07: 2 mL via INTRAVENOUS

## 2021-10-08 ENCOUNTER — Ambulatory Visit
Admission: RE | Admit: 2021-10-08 | Discharge: 2021-10-08 | Disposition: A | Discharge status: Home / Self Care | Payer: Self-pay | Source: Ambulatory Visit | Attending: Physician Assistant | Admitting: Physician Assistant

## 2021-10-08 ENCOUNTER — Ambulatory Visit: Payer: Self-pay | Admitting: Thoracic Surgery (Cardiothoracic Vascular Surgery)

## 2021-10-08 ENCOUNTER — Other Ambulatory Visit: Payer: Self-pay

## 2021-10-08 DIAGNOSIS — R911 Solitary pulmonary nodule: Secondary | ICD-10-CM | POA: Insufficient documentation

## 2021-10-08 IMAGING — CT CT CHEST W/O CM
2 of 4 series · 15 of 36 positions shown, 18 images · non-contrast
Comparison: [DATE]

CLINICAL DATA: Follow-up lung nodule.



[Series 2: chest 2.00 · axial · 0.70mm/px · z∈[-1230,-922]mm · 12 of 184 slices shown, 15 images]
[im 15/184  mediastinal]
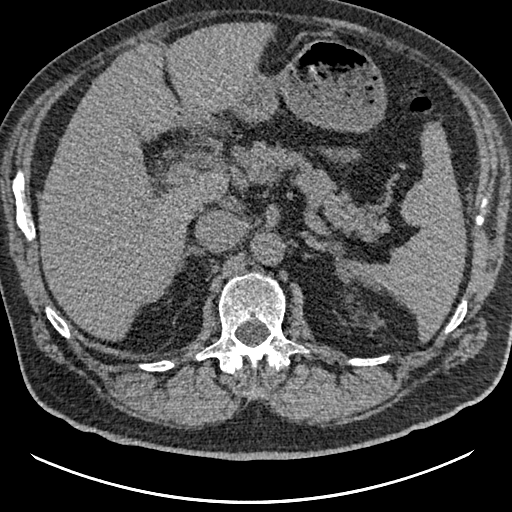
[im 15/184  lung]
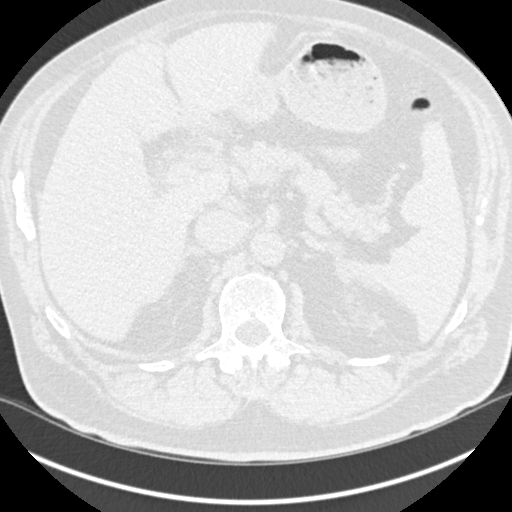
[im 29/184  lung]
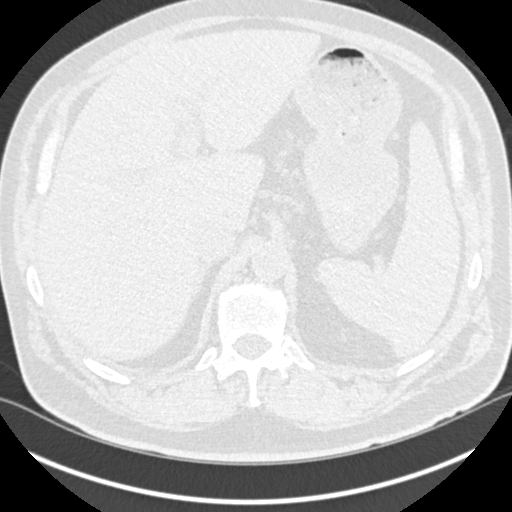
[im 43/184  lung]
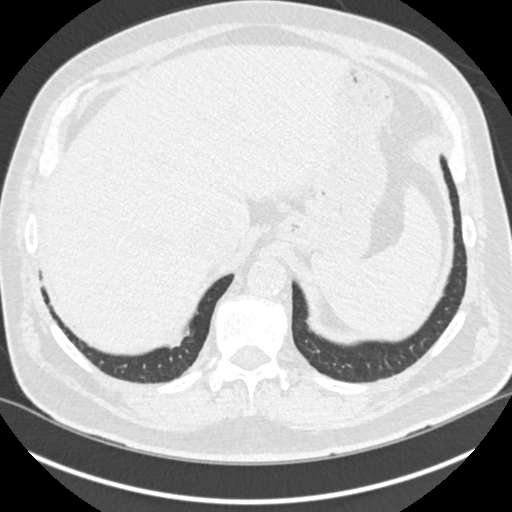
[im 57/184  lung]
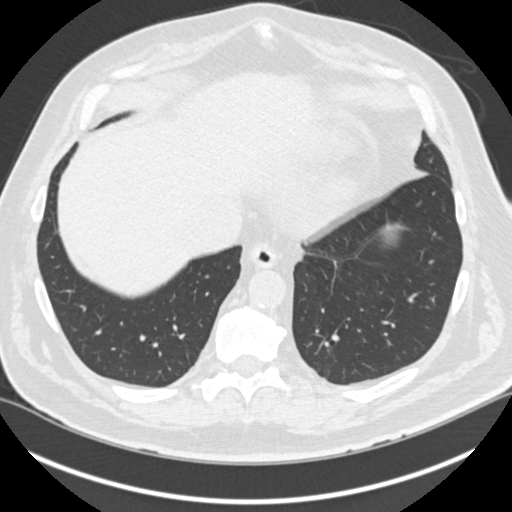
[im 71/184  mediastinal]
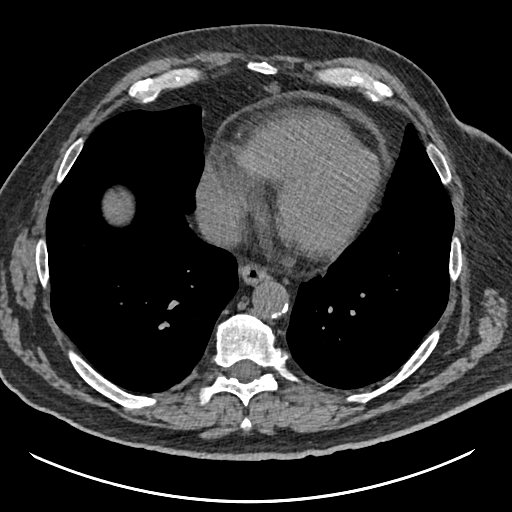
[im 71/184  lung]
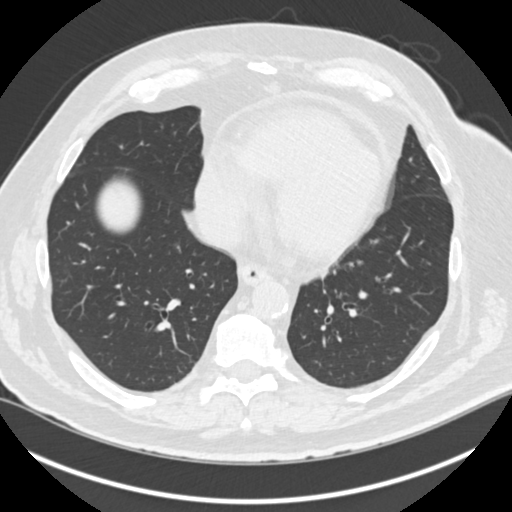
[im 85/184  lung]
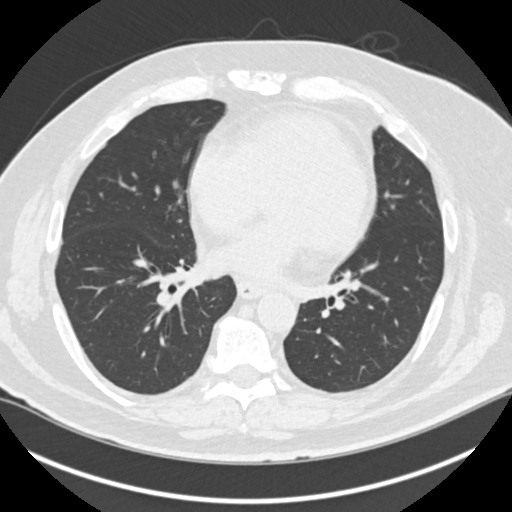
[im 99/184  lung]
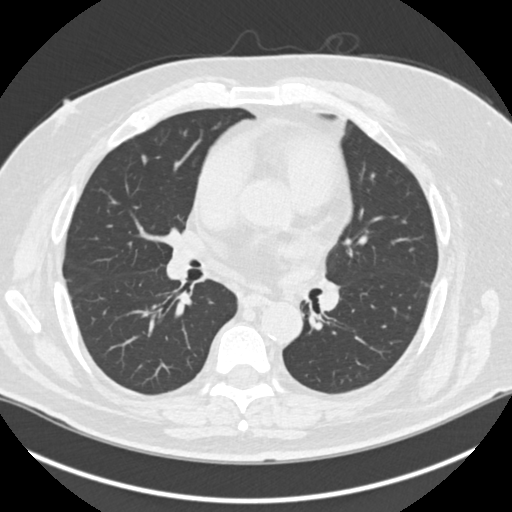
[im 113/184  lung]
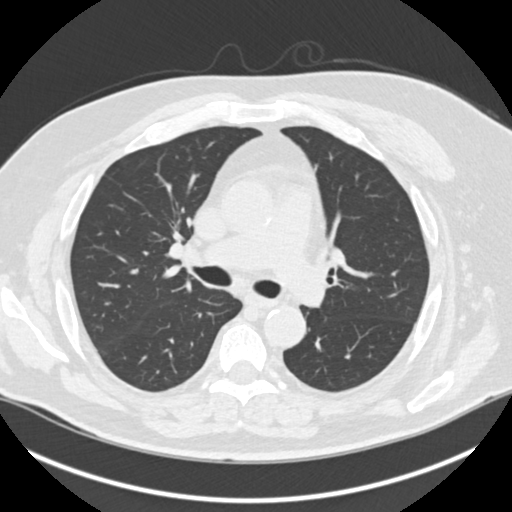
[im 127/184  mediastinal]
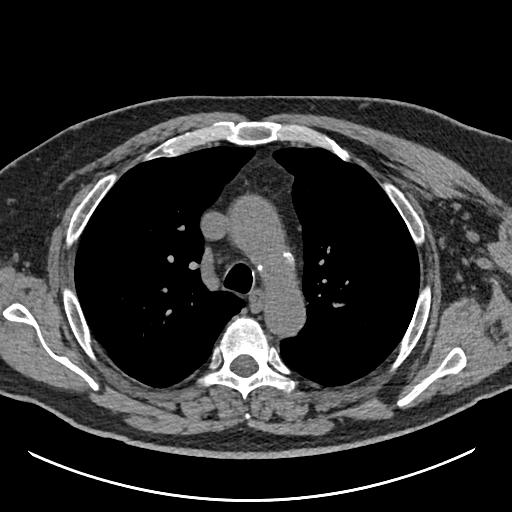
[im 127/184  lung]
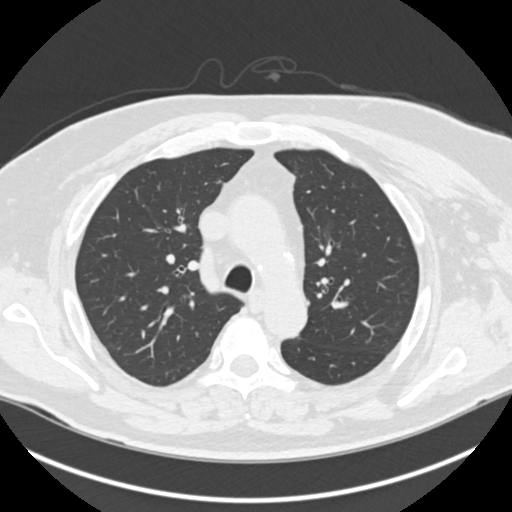
[im 141/184  lung]
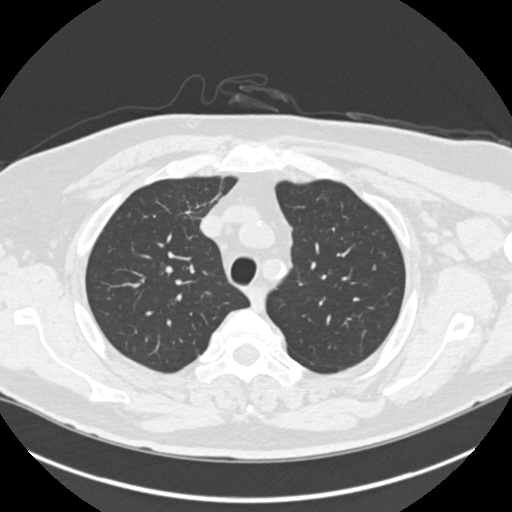
[im 155/184  lung]
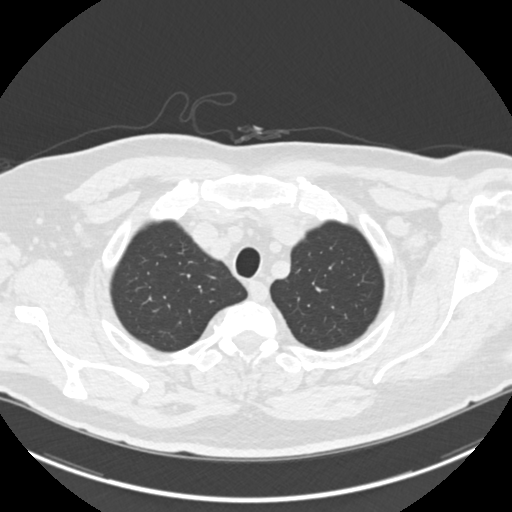
[im 169/184  lung]
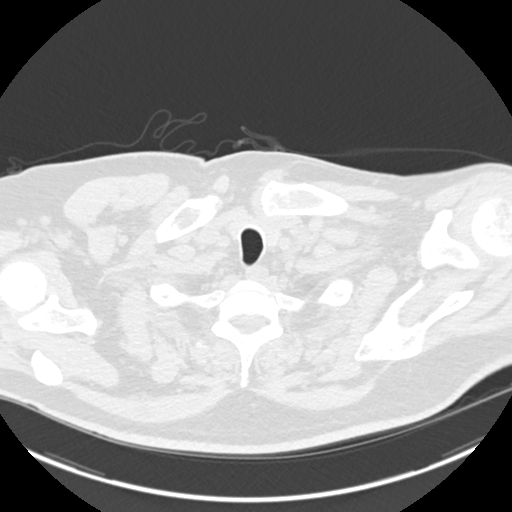

[Series 5: coronals chest 2.00 cor · coronal · 0.70mm/px · 3 of 161 slices shown]
[im 33/161  lung]
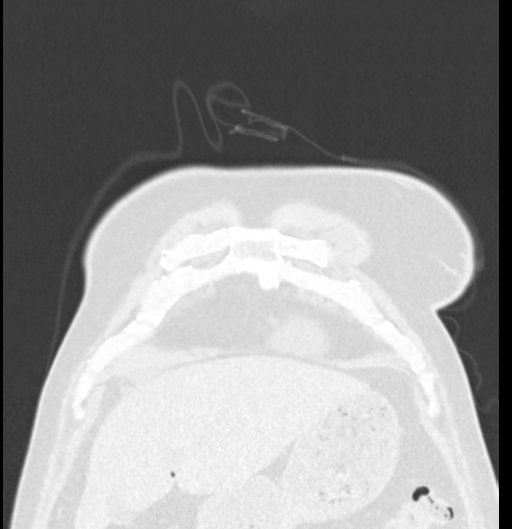
[im 65/161  lung]
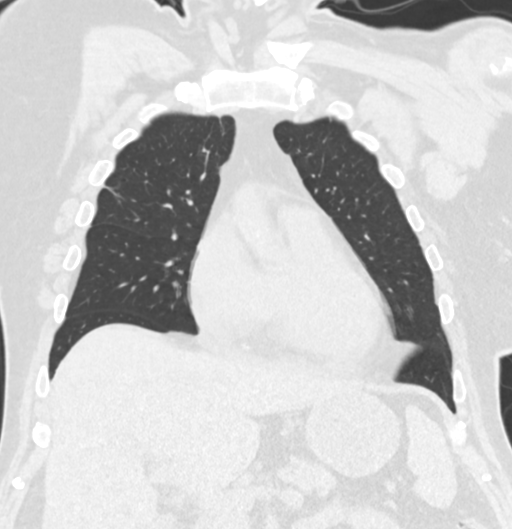
[im 97/161  lung]
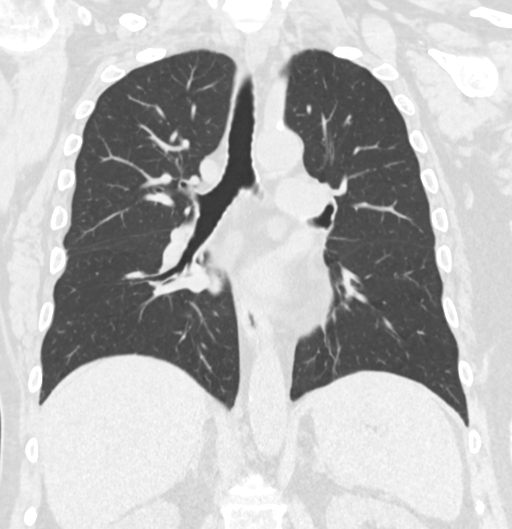

[15 of 36 positions shown; findings below may reference images not displayed]

FINDINGS: Cardiovascular: There is mild calcification of the aortic arch and
descending thoracic aorta. Normal heart size. There is a small
(approximately 7 mm) pericardial effusion.

Mediastinum/Nodes: No enlarged mediastinal or axillary lymph nodes.
Thyroid gland, trachea, and esophagus demonstrate no significant
findings.

Lungs/Pleura: A 4 mm solid, noncalcified lung nodule is seen within
the anterolateral aspect of the left apex (axial CT image 23, CT
series 3). This is decreased in size when compared to the prior
study.

A 5 mm solid, noncalcified anteromedial left upper lobe lung nodule
is seen (axial CT image 56, CT series 3).

A 4 mm noncalcified anterolateral right upper lobe lung nodule
versus focal scar is seen (axial CT image 67, CT series 3).

Mild linear scarring and/or atelectasis is seen within the anterior
medial aspect of the right upper lobe.

The 13 mm right upper lobe lung nodule seen on the prior study is no
longer visualized. The additional ill-defined bilateral noncalcified
lung nodules seen on the prior study are no longer present.

There is no evidence of acute infiltrate, pleural effusion or
pneumothorax.

Upper Abdomen: No acute abnormality.

Musculoskeletal: No chest wall mass or suspicious bone lesions
identified.
IMPRESSION: 1. Interval decrease in size and number of bilateral lung nodules,
as described above, likely consistent with sequelae associated with
an infectious/inflammatory process.
2. Small pericardial effusion
3. Mild right upper lobe linear scarring and/or atelectasis.
4. Aortic atherosclerosis.

Aortic Atherosclerosis ([15]-[15]).

## 2021-10-15 ENCOUNTER — Other Ambulatory Visit: Payer: Self-pay

## 2021-10-15 ENCOUNTER — Ambulatory Visit (INDEPENDENT_AMBULATORY_CARE_PROVIDER_SITE_OTHER): Payer: Self-pay | Admitting: Thoracic Surgery (Cardiothoracic Vascular Surgery)

## 2021-10-15 VITALS — BP 136/85 | HR 103 | Resp 20 | Ht 70.0 in | Wt 232.0 lb

## 2021-10-15 DIAGNOSIS — I079 Rheumatic tricuspid valve disease, unspecified: Secondary | ICD-10-CM

## 2021-10-15 NOTE — Progress Notes (Signed)
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  He was previously treated for this at [facility], and was not deemed a surgical candidate due to the fact that the vegetation was quite small in size.  Today he only complains of some pleuritic chest pain.  He has been off of his antibiotic regimen.  Physical Exam: BP 136/85 (BP Location: Left Arm, Patient Position: Sitting)    Pulse (!) 103    Resp 20    Ht 5\' 10"  (1.778 m)    Wt 232 lb (105.2 kg)    SpO2 99% Comment: RA   BMI 33.29 kg/m   Alert NAD Easy work of breathing Abdomen soft, ND    Diagnostic Studies & Laboratory data: Echocardiogram:  1. Left ventricular ejection fraction, by estimation, is 55 to 60%. The  left ventricle has normal function. The left ventricle has no regional  wall motion abnormalities. Left ventricular diastolic parameters are  indeterminate.   2. Right ventricular systolic function is normal. The right ventricular  size is not well visualized.   3. Right atrial size was mild to moderately dilated.   4. The mitral valve is grossly normal. Mild mitral valve regurgitation.   5. There is a mobile echodense (11 x 7 mm) mass in the tricuspid valve  apparatus (clip 44, 51). Given history of tricuspid mass, clinical  correlation advised.   6. The aortic valve was not well visualized. Aortic valve regurgitation  is not visualized.   7. The inferior vena cava is dilated in size with <50% respiratory  variability, suggesting right atrial pressure of 15 mmHg.   Assessment / Plan:   61 year old male with history of tricuspid  valve endocarditis.  He was previously treated with a full course of antibiotic therapy.  I personally reviewed his echocardiogram.  He does have a mobile mass that is in the subvalvular apparatus.  Additionally he does not significant tricuspid valve regurgitation.  Based on the location of the mass he would not be a candidate for angio vac debridement.  There is no strong indication for open debridement of the valve.  The patient also does not wish to proceed with that.  I also reviewed his cross-sectional imaging from last month.  His septic pulmonary emboli have healed.  Given that there is no indication for transcatheter debridement, and the patient does not want to proceed with open surgical debridement, he is clear from my standpoint.  He will follow-up as needed.   Lajuana Matte 10/15/2021 2:25 PM

## 2021-10-21 ENCOUNTER — Other Ambulatory Visit: Payer: Self-pay | Admitting: Gerontology

## 2021-10-21 ENCOUNTER — Other Ambulatory Visit: Payer: Self-pay

## 2021-10-21 DIAGNOSIS — E119 Type 2 diabetes mellitus without complications: Secondary | ICD-10-CM

## 2021-10-21 DIAGNOSIS — Z794 Long term (current) use of insulin: Secondary | ICD-10-CM

## 2021-10-21 DIAGNOSIS — M25512 Pain in left shoulder: Secondary | ICD-10-CM

## 2021-10-21 MED ORDER — MELOXICAM 7.5 MG PO TABS
7.5000 mg | ORAL_TABLET | Freq: Every day | ORAL | 0 refills | Status: DC
  Filled 2021-10-21: qty 30, 30d supply, fill #0

## 2021-10-27 ENCOUNTER — Other Ambulatory Visit: Payer: Self-pay

## 2021-11-02 ENCOUNTER — Ambulatory Visit: Payer: Self-pay | Admitting: Rheumatology

## 2021-11-02 ENCOUNTER — Other Ambulatory Visit: Payer: Self-pay

## 2021-11-02 ENCOUNTER — Encounter: Payer: Self-pay | Admitting: Rheumatology

## 2021-11-02 ENCOUNTER — Encounter: Payer: Self-pay | Admitting: Gerontology

## 2021-11-02 ENCOUNTER — Ambulatory Visit: Payer: Self-pay | Admitting: Gerontology

## 2021-11-02 VITALS — BP 135/88 | HR 96 | Temp 98.4°F | Resp 16 | Ht 70.0 in | Wt 227.2 lb

## 2021-11-02 VITALS — BP 135/88 | HR 96 | Temp 98.4°F | Resp 16 | Ht 70.0 in | Wt 227.0 lb

## 2021-11-02 DIAGNOSIS — R200 Anesthesia of skin: Secondary | ICD-10-CM

## 2021-11-02 DIAGNOSIS — R519 Headache, unspecified: Secondary | ICD-10-CM | POA: Insufficient documentation

## 2021-11-02 DIAGNOSIS — G629 Polyneuropathy, unspecified: Secondary | ICD-10-CM

## 2021-11-02 DIAGNOSIS — E119 Type 2 diabetes mellitus without complications: Secondary | ICD-10-CM

## 2021-11-02 DIAGNOSIS — Z794 Long term (current) use of insulin: Secondary | ICD-10-CM

## 2021-11-02 LAB — GLUCOSE, POCT (MANUAL RESULT ENTRY): POC Glucose: 120 mg/dl — AB (ref 70–99)

## 2021-11-02 MED ORDER — GABAPENTIN 100 MG PO CAPS
100.0000 mg | ORAL_CAPSULE | Freq: Two times a day (BID) | ORAL | 3 refills | Status: DC
  Filled 2021-11-02: qty 60, 30d supply, fill #0
  Filled 2021-12-07: qty 60, 30d supply, fill #1

## 2021-11-02 NOTE — Progress Notes (Signed)
Established Patient Office Visit  Subjective:  Patient ID: Joshua Cole, male    DOB: 1961/06/24  Age: 61 y.o. MRN: [id number]  CC:  Chief Complaint  Patient presents with   Follow-up   Diabetes    Patient states he has been checking his blood sugars at home and fasting are in the 120s.    HPI Joshua Cole  is a 61 year old male who has history of arthritis, type 2 diabetes, DJD, Gout,presents for routine follow up. His HgbA1c done on 10/05/21 was 6.1%, he checks his blood glucose tid, and it's usually less than 130 mg/dl. He denies hypo/hyperglycemic symptoms and his peripheral neuropathy is under control with taking gabapentin. He was seen by Cardiothoracic Surgeon Dr Kipp Brood H on 10/15/21,Based on the location of the mass he would not be a candidate for angio vac debridement, and There is no strong indication for open debridement of the valve. He denies chest pain, palpitation, but c/o constant  dull temporal headache that's there all the time. He states that headache has being going on for many months. He states that taking Tylenol moderately relieves his headache. He denies nausea/vomiting dizziness and vision changes. Overall, he states that he's doing well and offers no further complaint.  Past Medical History:  Diagnosis Date   Arthritis    Diabetes mellitus without complication (HCC)    DJD (degenerative joint disease)    Gout    Rotator cuff tear 08/26/2021    Past Surgical History:  Procedure Laterality Date   BIOPSY  07/16/2021   Procedure: BIOPSY;  Surgeon: Sharyn Creamer, MD;  Location: [facility] ENDOSCOPY;  Service: Gastroenterology;;  EGD and COLON   COLONOSCOPY N/A 07/16/2021   Procedure: COLONOSCOPY;  Surgeon: Sharyn Creamer, MD;  Location: [place];  Service: Gastroenterology;  Laterality: N/A;   COLONOSCOPY WITH PROPOFOL N/A 07/16/2021   Procedure: COLONOSCOPY WITH PROPOFOL;  Surgeon: Sharyn Creamer, MD;  Location: [place];  Service: Gastroenterology;   Laterality: N/A;   ESOPHAGOGASTRODUODENOSCOPY (EGD) WITH PROPOFOL N/A 07/16/2021   Procedure: ESOPHAGOGASTRODUODENOSCOPY (EGD) WITH PROPOFOL;  Surgeon: Sharyn Creamer, MD;  Location: [place];  Service: Gastroenterology;  Laterality: N/A;   JOINT REPLACEMENT Bilateral    hip   POLYPECTOMY  07/16/2021   Procedure: POLYPECTOMY;  Surgeon: Sharyn Creamer, MD;  Location: [facility] ENDOSCOPY;  Service: Gastroenterology;;   TEE WITHOUT CARDIOVERSION N/A 07/07/2021   Procedure: TRANSESOPHAGEAL ECHOCARDIOGRAM (TEE);  Surgeon: Kate Sable, MD;  Location: [facility] ORS;  Service: Cardiovascular;  Laterality: N/A;    Family History  Problem Relation Age of Onset   Other Mother        unknown medical history   Lung cancer Father    Psoriasis Sister    Breast cancer Sister    Hypertension Brother    Hyperlipidemia Brother    Diabetes Brother    Heart attack Brother 50    Social History   Socioeconomic History   Marital status: Unknown    Spouse name: Not on file   Number of children: Not on file   Years of education: Not on file   Highest education level: Not on file  Occupational History   Not on file  Tobacco Use   Smoking status: Former    Packs/day: 0.25    Years: 15.00    Pack years: 3.75    Types: Cigarettes    Quit date: 2007    Years since quitting: 16.1   Smokeless tobacco: Never  Vaping Use   Vaping  Use: Never used  Substance and Sexual Activity   Alcohol use: Yes    Alcohol/week: 6.0 standard drinks    Types: 6 Glasses of wine per week    Comment: couple of glasses of wine on the weekends. previous 20 beers per week   Drug use: Not Currently    Types: Cocaine    Comment: in the 62s   Sexual activity: Not on file  Other Topics Concern   Not on file  Social History Narrative   Not on file   Social Determinants of Health   Financial Resource Strain: Not on file  Food Insecurity: No Food Insecurity   Worried About Running Out of Food in the Last Year: Never true    Ran Out of Food in the Last Year: Never true  Transportation Needs: No Transportation Needs   Lack of Transportation (Medical): No   Lack of Transportation (Non-Medical): No  Physical Activity: Not on file  Stress: Not on file  Social Connections: Not on file  Intimate Partner Violence: Not on file    Outpatient Medications Prior to Visit  Medication Sig Dispense Refill   acetaminophen (TYLENOL) 325 MG tablet Take 2 tablets (650 mg total) by mouth every 6 (six) hours as needed for mild pain or fever.     ferrous sulfate 325 (65 FE) MG tablet Take 1 tablet (325 mg total) by mouth once daily with breakfast. 30 tablet 1   furosemide (LASIX) 20 MG tablet Take 1 tablet (20 mg total) by mouth once daily as needed (swelling or weight gain). 30 tablet 2   glucose blood (RIGHTEST GS550 BLOOD GLUCOSE) test strip USE AS DIRECTED TO CHECK BLOOD SUGAR UP TO 4 TIMES DAILY. 100 strip 0   Insulin Glargine (BASAGLAR KWIKPEN) 100 UNIT/ML Inject 15 Units into the skin once daily at bedtime. 15 mL 11   Insulin Pen Needle 32G X 4 MM MISC USE AS DIRECTED 100 each PRN   meloxicam (MOBIC) 7.5 MG tablet Take 1 tablet (7.5 mg total) by mouth once daily. 30 tablet 0   Rightest GL300 Lancets MISC USE AS DIRECTED TO CHECK BLOOD SUGAR UP TO 4 TIMES DAILY. 100 each 0   Vitamin D, Ergocalciferol, (DRISDOL) 1.25 MG (50000 UNIT) CAPS capsule Take 1 capsule (50,000 Units total) by mouth once every 7 (seven) days. 12 capsule 0   gabapentin (NEURONTIN) 100 MG capsule Take 1 capsule (100 mg total) by mouth 2 (two) times daily. 60 capsule 0   insulin lispro (HUMALOG) 100 UNIT/ML KwikPen Inject 0-15 Units into the skin 3 (three) times daily by sliding scale instructions as directed. 15 mL 11   urea (URE-NA) 15 g PACK oral packet Take 15 g by mouth once every Tuesday, Thursday, and Saturday at 6 PM. (Patient not taking: Reported on 10/15/2021) 18 packet 0   No facility-administered medications prior to visit.    No Known  Allergies  ROS Review of Systems  Constitutional: Negative.   Eyes: Negative.   Respiratory: Negative.    Cardiovascular: Negative.   Endocrine: Negative.   Neurological:  Positive for headaches.  Psychiatric/Behavioral: Negative.       Objective:    Physical Exam HENT:     Head: Normocephalic.     Mouth/Throat:     Mouth: Mucous membranes are moist.  Eyes:     Extraocular Movements: Extraocular movements intact.     Conjunctiva/sclera: Conjunctivae normal.     Pupils: Pupils are equal, round, and reactive to light.  Cardiovascular:  Rate and Rhythm: Normal rate and regular rhythm.     Pulses: Normal pulses.     Heart sounds: Normal heart sounds.  Pulmonary:     Effort: Pulmonary effort is normal.     Breath sounds: Normal breath sounds.  Neurological:     General: No focal deficit present.     Mental Status: He is alert and oriented to person, place, and time. Mental status is at baseline.    BP 135/88 (BP Location: Right Arm, Patient Position: Sitting, Cuff Size: Large)    Pulse 96    Temp 98.4 F (36.9 C) (Oral)    Resp 16    Ht '5\' 10"'  (1.778 m)    Wt 227 lb 3.2 oz (103.1 kg)    SpO2 96%    BMI 32.60 kg/m  Wt Readings from Last 3 Encounters:  11/02/21 227 lb 3.2 oz (103.1 kg)  11/02/21 227 lb (103 kg)  10/15/21 232 lb (105.2 kg)   Encouraged weight loss  Health Maintenance Due  Topic Date Due   COVID-19 Vaccine (1) Never done   FOOT EXAM  Never done   OPHTHALMOLOGY EXAM  Never done   TETANUS/TDAP  Never done   Zoster Vaccines- Shingrix (1 of 2) Never done    There are no preventive care reminders to display for this patient.  Lab Results  Component Value Date   TSH 0.162 (L) 07/07/2021   Lab Results  Component Value Date   WBC 10.2 09/20/2021   HGB 11.7 (L) 09/20/2021   HCT 37.0 (L) 09/20/2021   MCV 86 09/20/2021   PLT 139 (L) 09/20/2021   Lab Results  Component Value Date   NA 139 09/20/2021   K 4.4 09/20/2021   CO2 24 09/20/2021    GLUCOSE 146 (H) 09/20/2021   BUN 12 09/20/2021   CREATININE 0.82 09/20/2021   BILITOT 0.5 09/20/2021   ALKPHOS 90 09/20/2021   AST 13 09/20/2021   ALT 2 09/20/2021   PROT 8.9 (H) 09/20/2021   ALBUMIN 4.2 09/20/2021   CALCIUM 9.7 09/20/2021   ANIONGAP 8 07/20/2021   EGFR 101 09/20/2021   Lab Results  Component Value Date   CHOL 135 08/25/2021   Lab Results  Component Value Date   HDL 34 (L) 08/25/2021   Lab Results  Component Value Date   LDLCALC 84 08/25/2021   Lab Results  Component Value Date   TRIG 88 08/25/2021   Lab Results  Component Value Date   CHOLHDL 4.0 08/25/2021   Lab Results  Component Value Date   HGBA1C 6.1 (A) 10/05/2021      Assessment & Plan:      1. Type 2 diabetes mellitus without complication, with long-term current use of insulin (HCC) - His HgbA1c was 6.1%, his Humalog was discontinued, was advised to continue Basaglar, check his fasting blood glucose, record and bring log to follow up appointment. He was advised to notify clinic for hypoglycemia.  - POCT Glucose (CBG)  2. Peripheral polyneuropathy - He states that his peripheral neuropathy is under control with taking gabapentin. - gabapentin (NEURONTIN) 100 MG capsule; Take 1 capsule (100 mg total) by mouth 2 (two) times daily.  Dispense: 60 capsule; Refill: 3  3. Nonintractable headache, unspecified chronicity pattern, unspecified headache type - Unknown etiology of headache, he was advised to take tylenol 650 mg every 8 hours, and go to the ED for worsening symptoms. He was encouraged to complete [facility] financial application for Ophthalmology referral.  Follow-up: Return in about 8 weeks (around 12/30/2021), or if symptoms worsen or fail to improve.    Jontay Maston Jerold Coombe, NP

## 2021-11-02 NOTE — Progress Notes (Signed)
[facility] NOTE  Patient:Joshua Cole Male   DOB:01/07/1961     61 y.o.  ONG:[id number]  Visit Date: 11/02/2021  HPI:  61 year old white male.  History of diabetes.  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  Treated with antibiotics without surgery.  Had septic pulmonary emboli.  During the hospitalization he had significant hip and thigh pain.  Had MRI of the pelvis showing fluid in both prosthetic hips.  Question of whether they should be aspirated looking for infection.  He does not recall that having been done.  Was treated with antibiotics.  Since discharge she has had more pain and numbness in the thigh.  The thigh will sometimes tingle.  He feels like he walks funny.  He has had some numbness in the third through fifth toes worse on the right.  None in the calf.  Feet do not burn.  He has not had a rash in the.  Upper extremities without numbness and without significant pain.  Also has shoulder pain.  Had MR showing a right rotator cuff tear but no effusions He has not had fever since the hospitalization.  Did have septic pulmonary emboli but no stroke.  No bladder difficulty No knee pain       Past Medical History:  Diagnosis Date   Arthritis    Diabetes mellitus without complication (HCC)    DJD (degenerative joint disease)    Gout    Rotator cuff tear 08/26/2021    Past Surgical History:  Procedure Laterality Date   BIOPSY  07/16/2021   Procedure: BIOPSY;  Surgeon: Sharyn Creamer, MD;  Location: [facility] ENDOSCOPY;  Service: Gastroenterology;;  EGD and COLON   COLONOSCOPY N/A 07/16/2021   Procedure: COLONOSCOPY;  Surgeon: Sharyn Creamer, MD;  Location: [place];  Service: Gastroenterology;  Laterality: N/A;   COLONOSCOPY WITH PROPOFOL N/A 07/16/2021   Procedure: COLONOSCOPY WITH PROPOFOL;  Surgeon: Sharyn Creamer, MD;  Location: [place];  Service:  Gastroenterology;  Laterality: N/A;   ESOPHAGOGASTRODUODENOSCOPY (EGD) WITH PROPOFOL N/A 07/16/2021   Procedure: ESOPHAGOGASTRODUODENOSCOPY (EGD) WITH PROPOFOL;  Surgeon: Sharyn Creamer, MD;  Location: [place];  Service: Gastroenterology;  Laterality: N/A;   JOINT REPLACEMENT Bilateral    hip   POLYPECTOMY  07/16/2021   Procedure: POLYPECTOMY;  Surgeon: Sharyn Creamer, MD;  Location: [facility] ENDOSCOPY;  Service: Gastroenterology;;   TEE WITHOUT CARDIOVERSION N/A 07/07/2021   Procedure: TRANSESOPHAGEAL ECHOCARDIOGRAM (TEE);  Surgeon: Kate Sable, MD;  Location: [facility] ORS;  Service: Cardiovascular;  Laterality: N/A;    Social History   Tobacco Use   Smoking status: Former    Packs/day: 0.25    Years: 15.00    Pack years: 3.75    Types: Cigarettes    Quit date: 2007    Years since quitting: 16.1   Smokeless tobacco: Never  Substance Use Topics   Alcohol use: Yes    Alcohol/week: 6.0 standard drinks    Types: 6 Glasses of wine per week    Comment: last use 07/03/21 (hospital admission) previous 20 beers per week     MEDICATIONS: Current Outpatient Medications  Medication Sig Dispense Refill   acetaminophen (TYLENOL) 325 MG tablet Take 2 tablets (650 mg total) by mouth every 6 (six) hours as needed for mild pain or fever.     ferrous sulfate 325 (65 FE) MG tablet Take 1 tablet (325 mg total) by mouth once daily with breakfast. 30  tablet 1   furosemide (LASIX) 20 MG tablet Take 1 tablet (20 mg total) by mouth once daily as needed (swelling or weight gain). 30 tablet 2   gabapentin (NEURONTIN) 100 MG capsule Take 1 capsule (100 mg total) by mouth 2 (two) times daily. 60 capsule 0   glucose blood (RIGHTEST GS550 BLOOD GLUCOSE) test strip USE AS DIRECTED TO CHECK BLOOD SUGAR UP TO 4 TIMES DAILY. 100 strip 0   Insulin Glargine (BASAGLAR KWIKPEN) 100 UNIT/ML Inject 15 Units into the skin once daily at bedtime. 15 mL 11   insulin lispro (HUMALOG) 100 UNIT/ML KwikPen Inject 0-15 Units  into the skin 3 (three) times daily by sliding scale instructions as directed. 15 mL 11   Insulin Pen Needle 32G X 4 MM MISC USE AS DIRECTED 100 each PRN   meloxicam (MOBIC) 7.5 MG tablet Take 1 tablet (7.5 mg total) by mouth once daily. 30 tablet 0   Rightest GL300 Lancets MISC USE AS DIRECTED TO CHECK BLOOD SUGAR UP TO 4 TIMES DAILY. 100 each 0   urea (URE-NA) 15 g PACK oral packet Take 15 g by mouth once every Tuesday, Thursday, and Saturday at 6 PM. (Patient not taking: Reported on 10/15/2021) 18 packet 0   Vitamin D, Ergocalciferol, (DRISDOL) 1.25 MG (50000 UNIT) CAPS capsule Take 1 capsule (50,000 Units total) by mouth once every 7 (seven) days. 12 capsule 0   No current facility-administered medications for this visit.     ALLERGIES No Known Allergies   PHYSICAL EXAM: There were no vitals taken for this visit. Pleasant male.  No acute distress.  Sclera clear.  Clear chest.  Has femoral pulses.  Good distal pulses.  Trace edema.  Liver is down 3 finger breaths nontender.  No spleen tip. Musculoskeletal: Right shoulder has decreased abduction external rotation.  Elbows without synovitis.  There is mild thickening of the right second MCP but no triggering.  Left hand without synovitis.  Both hips move reasonably well.  Knees without definite effusion.  Ankles and toes without definite synovitis Neurologic: He has decreased pin anterior and lateral leg in both thighs but not in the distal leg except in the right third fourth and fifth toes.  He has knee and ankle jerks.  Able to stand on his toes and his heels but has mildly antalgic gait.  4/5 EHL bilaterally   ASSESSMENT: Bilateral thigh pain and thigh and foot numbness.  Occurring after complicated hospitalization with right-sided endocarditis.  Cannot rule out loosening or recent infection of his hips prosthesis given the fact that he had effusions on MR but that does not fit with his numbness.  Atypical for peripheral neuropathy.  He is  not weak to suggest spinal cord infarct.  Cannot rule out spondylolisthesis or lumbar disc disease Recent right-sided bacterial endocarditis Right shoulder rotator cuff tear does not appear to be inflammatory Prior history of gout    PLAN: Need more diagnostic studies.  Lumbar spine films to rule out spondylolisthesis Orthopedic opinion regarding his prosthetic hips Nerve conductions.  He is applying for charity care for for these studies Sed rate CPK today Disposition depends on the above    G. Fayrene Fearing. MD           11/02/2021,  9:27 AM

## 2021-11-03 ENCOUNTER — Other Ambulatory Visit: Payer: Self-pay

## 2021-11-03 LAB — SEDIMENTATION RATE: Sed Rate: 32 mm/hr — ABNORMAL HIGH (ref 0–30)

## 2021-11-03 LAB — CK: Total CK: 21 U/L — ABNORMAL LOW (ref 41–331)

## 2021-11-18 ENCOUNTER — Other Ambulatory Visit: Payer: Self-pay | Admitting: Gerontology

## 2021-11-18 ENCOUNTER — Other Ambulatory Visit: Payer: Self-pay

## 2021-11-18 DIAGNOSIS — D62 Acute posthemorrhagic anemia: Secondary | ICD-10-CM

## 2021-11-18 DIAGNOSIS — M25512 Pain in left shoulder: Secondary | ICD-10-CM

## 2021-11-18 DIAGNOSIS — E559 Vitamin D deficiency, unspecified: Secondary | ICD-10-CM

## 2021-11-19 ENCOUNTER — Other Ambulatory Visit: Payer: Self-pay

## 2021-11-22 ENCOUNTER — Other Ambulatory Visit: Payer: Self-pay

## 2021-11-22 ENCOUNTER — Other Ambulatory Visit: Payer: Self-pay | Admitting: Gerontology

## 2021-11-22 DIAGNOSIS — D62 Acute posthemorrhagic anemia: Secondary | ICD-10-CM

## 2021-11-22 DIAGNOSIS — M25512 Pain in left shoulder: Secondary | ICD-10-CM

## 2021-11-22 DIAGNOSIS — E559 Vitamin D deficiency, unspecified: Secondary | ICD-10-CM

## 2021-11-23 ENCOUNTER — Other Ambulatory Visit: Payer: Self-pay | Admitting: Gerontology

## 2021-11-23 ENCOUNTER — Other Ambulatory Visit: Payer: Self-pay

## 2021-11-23 DIAGNOSIS — M25512 Pain in left shoulder: Secondary | ICD-10-CM

## 2021-11-23 DIAGNOSIS — D62 Acute posthemorrhagic anemia: Secondary | ICD-10-CM

## 2021-11-23 MED ORDER — MELOXICAM 7.5 MG PO TABS
7.5000 mg | ORAL_TABLET | Freq: Every day | ORAL | 0 refills | Status: DC
  Filled 2021-11-23: qty 30, 30d supply, fill #0

## 2021-11-23 MED ORDER — FERROUS SULFATE 325 (65 FE) MG PO TABS
325.0000 mg | ORAL_TABLET | Freq: Every day | ORAL | 1 refills | Status: DC
  Filled 2021-11-23: qty 30, 30d supply, fill #0
  Filled 2021-12-22: qty 30, 30d supply, fill #1

## 2021-11-24 ENCOUNTER — Ambulatory Visit (INDEPENDENT_AMBULATORY_CARE_PROVIDER_SITE_OTHER): Payer: Self-pay | Admitting: Nurse Practitioner

## 2021-11-24 ENCOUNTER — Other Ambulatory Visit: Payer: Self-pay

## 2021-11-24 ENCOUNTER — Ambulatory Visit
Admission: RE | Admit: 2021-11-24 | Discharge: 2021-11-24 | Disposition: A | Discharge status: Home / Self Care | Payer: Self-pay | Source: Ambulatory Visit | Attending: Rheumatology | Admitting: Rheumatology

## 2021-11-24 ENCOUNTER — Ambulatory Visit
Admission: RE | Admit: 2021-11-24 | Discharge: 2021-11-24 | Disposition: A | Discharge status: Home / Self Care | Payer: Self-pay | Attending: Rheumatology | Admitting: Rheumatology

## 2021-11-24 ENCOUNTER — Encounter: Payer: Self-pay | Admitting: Nurse Practitioner

## 2021-11-24 VITALS — BP 130/82 | HR 107 | Ht 70.0 in | Wt 229.2 lb

## 2021-11-24 DIAGNOSIS — I330 Acute and subacute infective endocarditis: Secondary | ICD-10-CM

## 2021-11-24 DIAGNOSIS — R200 Anesthesia of skin: Secondary | ICD-10-CM | POA: Insufficient documentation

## 2021-11-24 DIAGNOSIS — Z01810 Encounter for preprocedural cardiovascular examination: Secondary | ICD-10-CM

## 2021-11-24 DIAGNOSIS — I078 Other rheumatic tricuspid valve diseases: Secondary | ICD-10-CM

## 2021-11-24 DIAGNOSIS — I498 Other specified cardiac arrhythmias: Secondary | ICD-10-CM

## 2021-11-24 DIAGNOSIS — I442 Atrioventricular block, complete: Secondary | ICD-10-CM

## 2021-11-24 IMAGING — CR DG LUMBAR SPINE COMPLETE 4+V
1 series · 5 of 5 positions shown · non-contrast
Comparison: None.

CLINICAL DATA: Midline lower back pain radiating into left hip,
numbness in both thighs since [REDACTED].

EXAM:
LUMBAR SPINE - COMPLETE 4+ VIEW

[Series 1: dg lumbar spine complete 4 +v · 0.14mm/px · 5 of 5 slices shown]
[im 1/5]
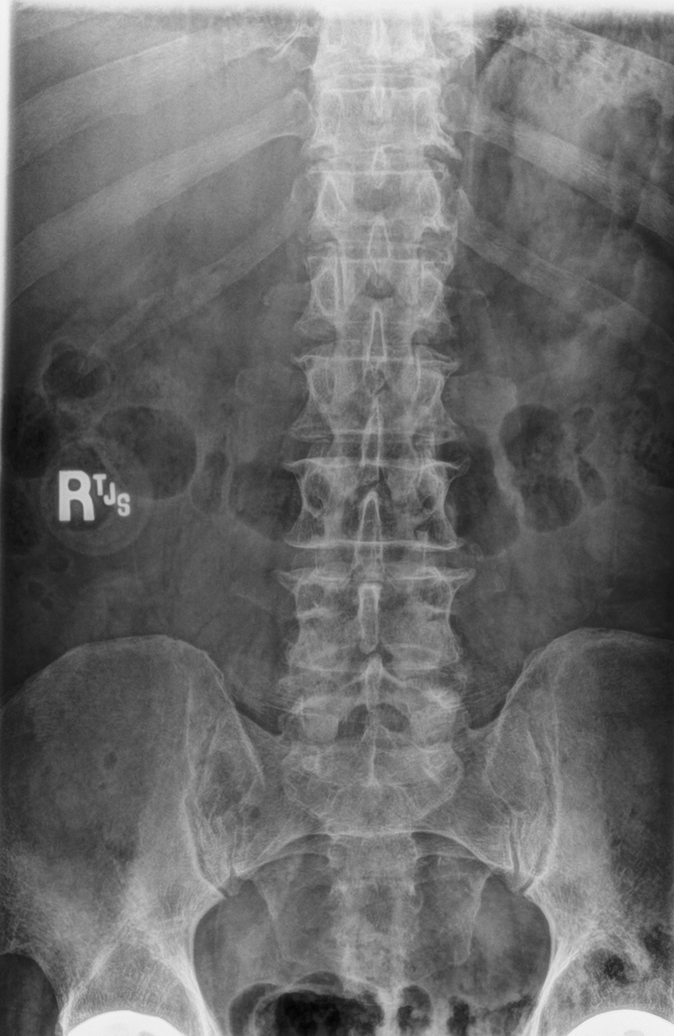
[im 2/5]
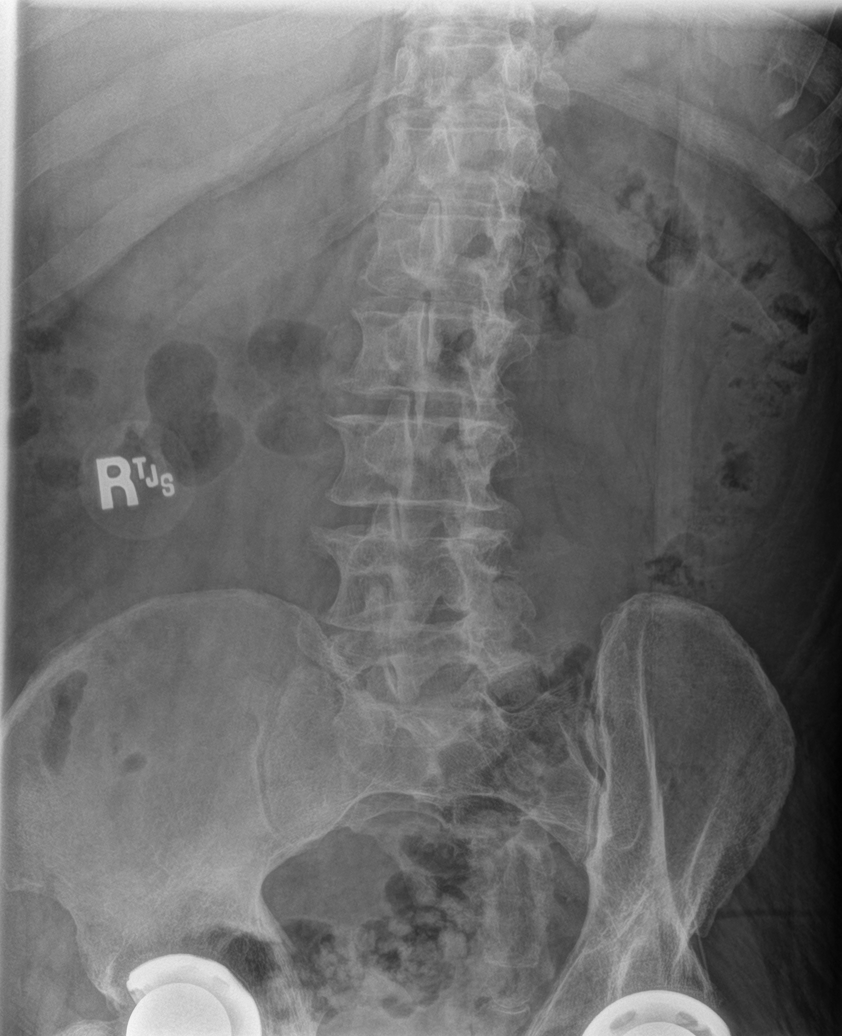
[im 3/5]
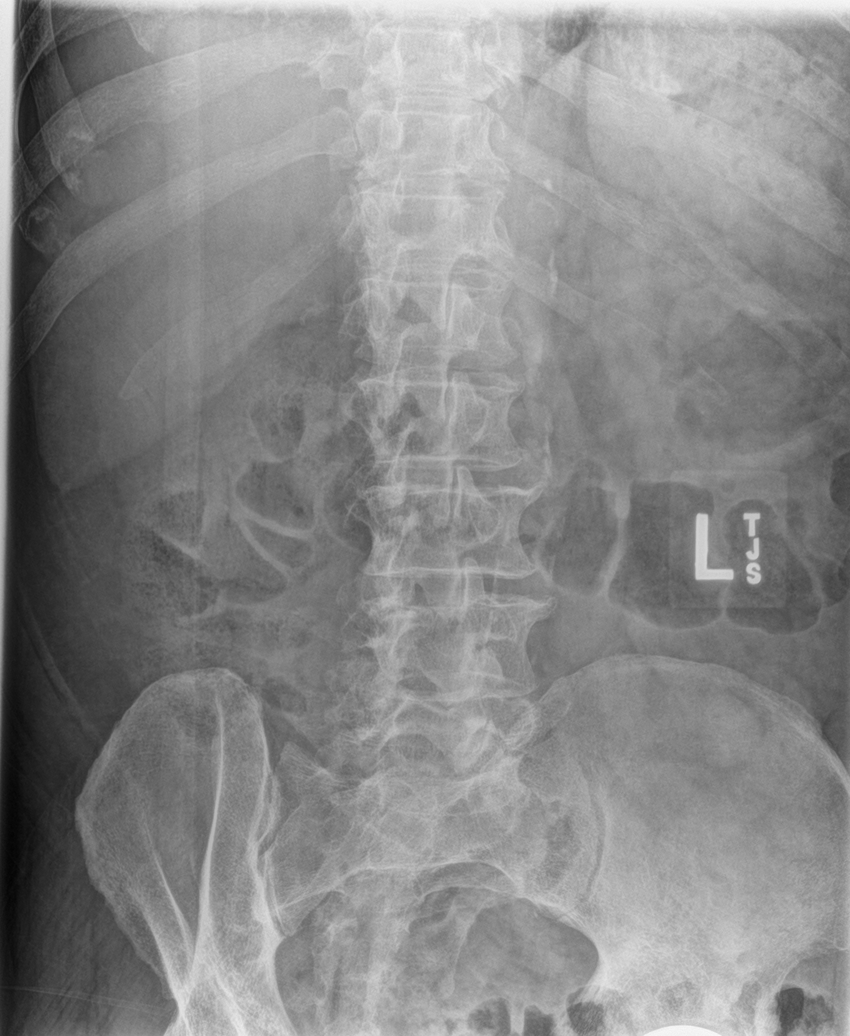
[im 4/5]
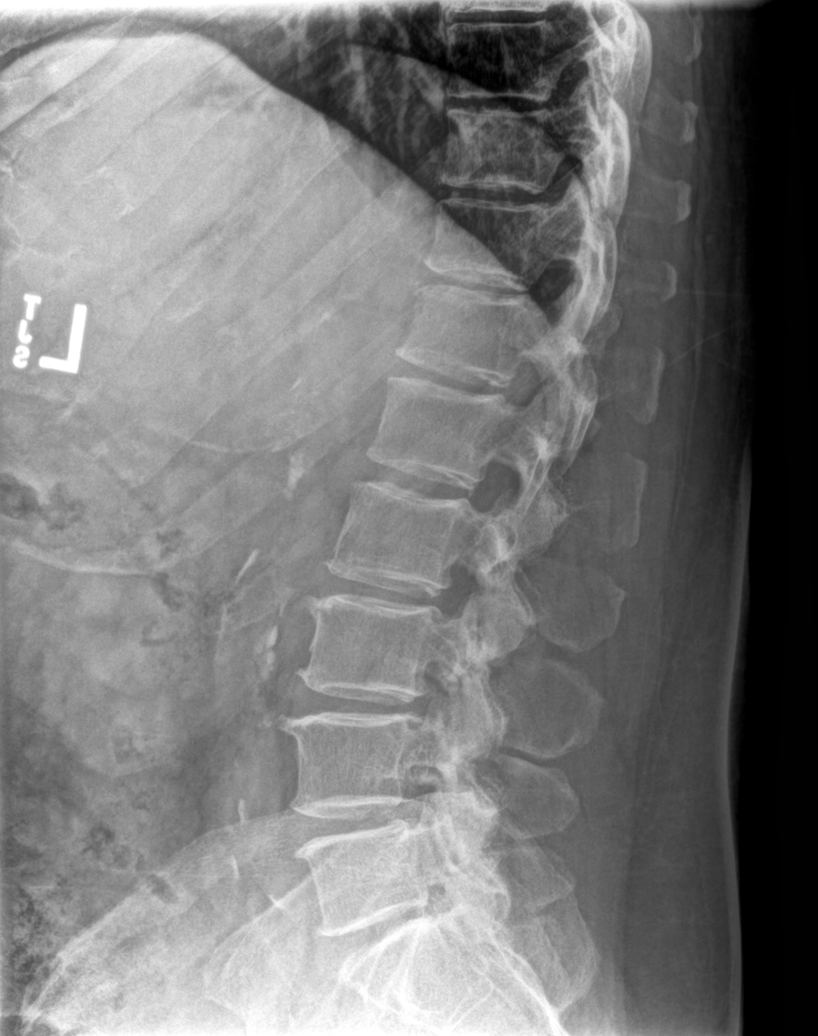
[im 5/5]
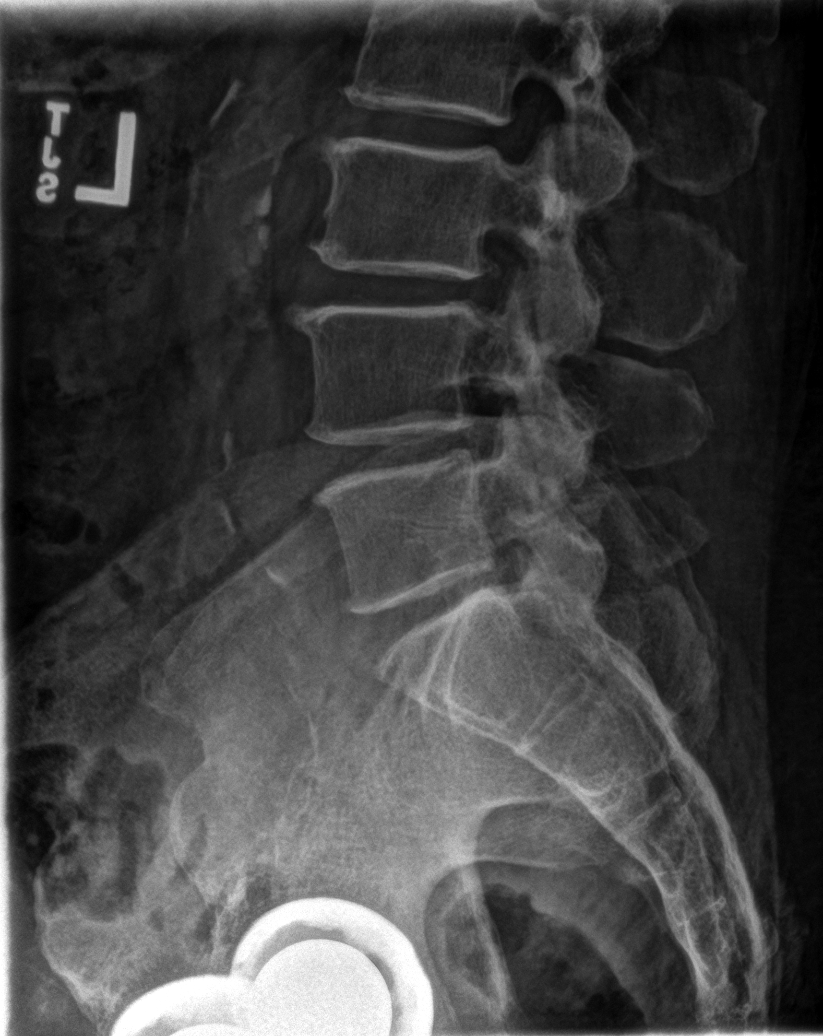

[5 of 5 positions shown; findings below may reference images not displayed]

FINDINGS: Five non-rib-bearing lumbar vertebral bodies. Multilevel
mild-to-moderate degenerative changes of the spine. There is no
evidence of lumbar spine fracture. Alignment is normal.
Intervertebral disc spaces are maintained.

Partially visualized total bilateral hip arthroplasties.

Vascular calcifications.
IMPRESSION: 1. No acute displaced fracture or traumatic listhesis of the lumbar
spine.
2.  Aortic Atherosclerosis ([6Z]-[6Z]).

## 2021-11-24 NOTE — Progress Notes (Signed)
? ? ?Office Visit  ?  ?Patient Name: Joshua Cole ?Date of Encounter: 11/24/2021 ? ?Primary Care Provider:  Langston Reusing, NP ?Primary Cardiologist:  Kathlyn Sacramento, MD ? ?Chief Complaint  ?  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 ? ?Past Medical History  ?  ?Past Medical History:  ?Diagnosis Date  ? Arthritis   ? Diabetes mellitus without complication ([place])   ? DJD (degenerative joint disease)   ? Gout   ? Lung nodules   ? a. 09/2021 CT chest: Interval decrease in size and number of bilateral lung nodules, likely consistent with sequelae associated with an infectious/inflammatory process.  ? MSSA bacteremia 06/2021  ? Rotator cuff tear 08/26/2021  ? Subacute bacterial endocarditis (SBE)   ? a. 06/2021 TEE: mobile mass attached to the tricuspid valve-->Abx rx-->09/2021 TEE: EF 55-60%, no rwma, nl RV fxn, mild-mod RAE, mild MR, mobile echodense 11x7m mass in the TV apparatus-->conservative rx per TCTS.  ? ?Past Surgical History:  ?Procedure Laterality Date  ? BIOPSY  07/16/2021  ? Procedure: BIOPSY;  Surgeon: DSharyn Creamer MD;  [facility];  Service: Gastroenterology;;  EGD and COLON  ? COLONOSCOPY N/A 07/16/2021  ? Procedure: COLONOSCOPY;  Surgeon: DSharyn Creamer MD;  [facility]  Service: Gastroenterology;  Laterality: N/A;  ? COLONOSCOPY WITH PROPOFOL N/A 07/16/2021  ? Procedure: COLONOSCOPY WITH PROPOFOL;  Surgeon: DSharyn Creamer MD;  [place] [facility]  Service: Gastroenterology;  Laterality: N/A;  ? ESOPHAGOGASTRODUODENOSCOPY (EGD) WITH PROPOFOL N/A 07/16/2021  ? Procedure: ESOPHAGOGASTRODUODENOSCOPY (EGD) WITH PROPOFOL;  Surgeon: DSharyn Creamer MD;  [facility]  Service: Gastroenterology;  Laterality: N/A;  ? JOINT REPLACEMENT Bilateral   ? hip  ? POLYPECTOMY  07/16/2021   ? Procedure: POLYPECTOMY;  Surgeon: DSharyn Creamer MD;  [facility];  Service: Gastroenterology;;  ? TEE WITHOUT CARDIOVERSION N/A 07/07/2021  ? Procedure: TRANSESOPHAGEAL ECHOCARDIOGRAM (TEE);  Surgeon: AKate Sable MD;  Location: [facility];  Service: Cardiovascular;  Laterality: N/A;  ? ? ?Allergies ? ?No Known Allergies ? ?History of Present Illness  ?  ?61year old male with the above past medical history including MSSA bacteremia, endocarditis, tricuspid valve vegetation, complete heart block accelerated junctional escape, and diabetes.  He was admitted October 2022 with weakness and hip pain.  He was found to have MSSA bacteremia.  CTA of the chest was negative for PE.  In the setting of bacteremia, a TEE was performed and showed a tricuspid valve vegetation.  He also developed complete heart block with junctional escape, and did not require temporary transvenous pacing.  [facility] recommendation for ongoing antibiotic therapy and outpatient follow-up.  Hospital course was complicated by anemia requiring 2 units of packed red blood cells.  He was subsequently followed by infectious disease in the outpatient setting.  Following completion of antibiotics, repeat blood cultures were drawn and negative.  PICC line was removed in mid December.  Follow-up with the EP in December 2022 and was in an accelerated junctional rhythm.  Recommendation was made for ongoing conservative therapy.  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  He saw CT surgery on  February 3 it was felt that based on the location of the mass, he would not be a good candidate for angio VAC debridement.  There was no strong indication for open debridement and it was collectively agreed to  continue conservative management. ? ?Over the past 2 months, he has done reasonably well.  His chronic, stable dyspnea on exertion dating back to his hospitalization October 2022.  He does not experience chest pain and denies palpitations, PND, orthopnea, dizziness, syncope, edema, or early satiety.  He has left rotator cuff disease and plans on seeing Ortho next week.  He presumes he will need surgery.  We discussed that in the setting of chronic dyspnea [place] and otherwise limited activity secondary to chronic hip pain, he will require stress testing prior to surgery.  He will wait until surgery is scheduled to pursue. ? ?Home Medications  ?  ?Current Outpatient Medications  ?Medication Sig Dispense Refill  ? acetaminophen (TYLENOL) 500 MG tablet Take 1,000 mg by mouth in the morning, at noon, in the evening, and at bedtime.    ? ferrous sulfate 325 (65 FE) MG tablet Take 1 tablet (325 mg total) by mouth once daily with breakfast. 30 tablet 1  ? furosemide (LASIX) 20 MG tablet Take 1 tablet (20 mg total) by mouth once daily as needed (swelling or weight gain). 30 tablet 2  ? gabapentin (NEURONTIN) 100 MG capsule Take 1 capsule (100 mg total) by mouth 2 (two) times daily. 60 capsule 3  ? glucose blood (RIGHTEST GS550 BLOOD GLUCOSE) test strip USE AS DIRECTED TO CHECK BLOOD SUGAR UP TO 4 TIMES DAILY. 100 strip 0  ? Insulin Glargine (BASAGLAR KWIKPEN) 100 UNIT/ML Inject 15 Units into the skin once daily at bedtime. 15 mL 11  ? meloxicam (MOBIC) 7.5 MG tablet Take 1 tablet (7.5 mg total) by mouth once daily. 30 tablet 0  ? acetaminophen (TYLENOL) 325 MG tablet Take 2 tablets (650 mg total) by mouth every 6 (six) hours as needed for mild pain or fever. (Patient not taking: Reported on 11/24/2021)    ? Insulin Pen Needle 32G X 4 MM MISC USE AS DIRECTED (Patient not taking: Reported on 11/24/2021) 100 each PRN  ? Rightest GL300 Lancets MISC USE AS DIRECTED TO CHECK BLOOD SUGAR UP TO 4 TIMES DAILY. (Patient not taking:  Reported on 11/24/2021) 100 each 0  ? urea (URE-NA) 15 g PACK oral packet Take 15 g by mouth once every Tuesday, Thursday, and Saturday at 6 PM. (Patient not taking: Reported on 10/15/2021) 18 packet 0  ? ?No current facility-administered medications for this visit.  ?  ? ?Review of Systems  ?  ?Chronic, stable dyspnea exertion dating back to October hospitalization.  He denies chest pain, palpitations, PND, orthopnea, dizziness, syncope, edema, or early satiety.  All other systems reviewed and are otherwise negative except as noted above. ?  ? ?Physical Exam  ?  ?VS:  BP 130/82 (BP Location: Left Arm, Patient Position: Sitting, Cuff Size: Normal)   Pulse (!) 107   Ht '5\' 10"'$  (1.778 m)   Wt 229 lb 4 oz (104 kg)   SpO2 98%   BMI 32.89 kg/m?  , BMI Body mass index is 32.89 kg/m?. ?    ?GEN: Well nourished, well developed, in no acute distress. ?HEENT: normal. ?Neck: Supple, no JVD, carotid bruits, or masses. ?Cardiac: RRR, tachycardic, no murmurs, rubs, or gallops. No clubbing, cyanosis, edema.  Radials/PT 2+ and equal bilaterally.  ?Respiratory:  Respirations regular  and unlabored, clear to auscultation bilaterally. ?GI: Soft, nontender, nondistended, BS + x 4. ?MS: no deformity or atrophy. ?Skin: warm and dry, no rash. ?Neuro:  Strength and sensation are intact. ?Psych: Normal affect. ? ?Accessory Clinical Findings  ?  ?ECG personally reviewed by me today -accelerated junctional rhythm, 107, prolonged QT- no acute changes. ? ?Lab Results  ?Component Value Date  ? WBC 10.2 09/20/2021  ? HGB 11.7 (L) 09/20/2021  ? HCT 37.0 (L) 09/20/2021  ? MCV 86 09/20/2021  ? PLT 139 (L) 09/20/2021  ? ?Lab Results  ?Component Value Date  ? CREATININE 0.82 09/20/2021  ? BUN 12 09/20/2021  ? NA 139 09/20/2021  ? K 4.4 09/20/2021  ? CL 103 09/20/2021  ? CO2 24 09/20/2021  ? ?Lab Results  ?Component Value Date  ? ALT 2 09/20/2021  ? AST 13 09/20/2021  ? ALKPHOS 90 09/20/2021  ? BILITOT 0.5 09/20/2021  ? ?Lab Results  ?Component Value  Date  ? CHOL 135 08/25/2021  ? HDL 34 (L) 08/25/2021  ? [place] 84 08/25/2021  ? TRIG 88 08/25/2021  ? CHOLHDL 4.0 08/25/2021  ?  ?Lab Results  ?Component Value Date  ? HGBA1C 6.1 (A) 10/05/2021  ? ? ?Assessment

## 2021-11-24 NOTE — Patient Instructions (Signed)
Medication Instructions:  ?No changes at this time. ? ?*If you need a refill on your cardiac medications before your next appointment, please call your pharmacy* ? ? ?Lab Work: ?None ? ?If you have labs (blood work) drawn today and your tests are completely normal, you will receive your results only by: ?MyChart Message (if you have MyChart) OR ?A paper copy in the mail ?If you have any lab test that is abnormal or we need to change your treatment, we will call you to review the results. ? ? ?Testing/Procedures: ?None ? ? ?Follow-Up: ?At [facility], you and your health needs are our priority.  As part of our continuing mission to provide you with exceptional heart care, we have created designated Provider Care Teams.  These Care Teams include your primary Cardiologist (physician) and Advanced Practice Providers (APPs -  Physician Assistants and Nurse Practitioners) who all work together to provide you with the care you need, when you need it. ? ?Your next appointment:   ?3 month(s) ? ?The format for your next appointment:   ?In Person ? ?Provider:   ?Lars Mage, MD ?

## 2021-11-29 ENCOUNTER — Ambulatory Visit: Payer: Self-pay | Admitting: Orthopaedic Surgery

## 2021-12-02 ENCOUNTER — Other Ambulatory Visit: Payer: Self-pay

## 2021-12-02 ENCOUNTER — Ambulatory Visit (INDEPENDENT_AMBULATORY_CARE_PROVIDER_SITE_OTHER): Payer: Self-pay | Admitting: Orthopaedic Surgery

## 2021-12-02 ENCOUNTER — Encounter: Payer: Self-pay | Admitting: Orthopaedic Surgery

## 2021-12-02 DIAGNOSIS — M75122 Complete rotator cuff tear or rupture of left shoulder, not specified as traumatic: Secondary | ICD-10-CM

## 2021-12-02 DIAGNOSIS — M25512 Pain in left shoulder: Secondary | ICD-10-CM

## 2021-12-02 DIAGNOSIS — G8929 Other chronic pain: Secondary | ICD-10-CM

## 2021-12-02 NOTE — Progress Notes (Signed)
The patient is a 61 year old gentleman referred to me for evaluation treatment of the left shoulder torn rotator cuff.  He has been dealing with a chronic shoulder issue for a long period of time.  In August of last year he developed M SSA bacteremia and had endocarditis.  A MRI of his left shoulder was obtained in November of this past year and it showed a full-thickness rotator cuff tear of his left shoulder.  There is significant arthropathy of the [facility] joint.  There is also tearing of the labrum and biceps tendon.  This all appears chronic.  There is significant fluid in the subacromial outlet.  He is now off of all antibiotics.  He has been followed closely by cardiology.  He is hoping to have surgery on his left shoulder but he understands he will definitely need cardiac clearance for this type of surgery.  He is a diabetic but reports blood glucose control that is good.  His hemoglobin A1c has been under 7 recently.  He reports decreased shoulder function with severe pain and weakness of the left shoulder. ? ?On exam he has significant limitations at abduction of the left shoulder there is profound weakness and pain with any attempts of motion of the shoulder.  I believe there is a development of arthrofibrosis as well given the stiffness in his left shoulder. ? ?I did review the MRI with him and he does have a full-thickness rotator cuff tear with no muscle atrophy.  There is significant AC joint arthropathy and other tearing within the shoulder itself in terms of the superior labrum and biceps anchor. ? ?I did talk in length in detail about arthroscopic surgery for his left shoulder.  He understands this may be quite difficult in terms of his recovery and the need for outpatient physical therapy which can be done in [place] where he lives.  I explained what the surgery involves as well as discussed the risk and benefits of surgery.  He will definitely need cardiac clearance and we will work on trying to  get this surgery scheduled pending his cardiac clearance as well.  All question concerns were answered and addressed. ?

## 2021-12-03 ENCOUNTER — Telehealth: Payer: Self-pay | Admitting: Nurse Practitioner

## 2021-12-03 DIAGNOSIS — Z01810 Encounter for preprocedural cardiovascular examination: Secondary | ICD-10-CM

## 2021-12-03 DIAGNOSIS — R0609 Other forms of dyspnea: Secondary | ICD-10-CM

## 2021-12-03 NOTE — Telephone Encounter (Signed)
Patient scheduled for stress test and reviewed all instructions. He verbalized understanding of our conversation with no further questions at this time.  ?

## 2021-12-03 NOTE — Telephone Encounter (Signed)
Patient calling to discuss & schedule a stress test needed before up coming procedure date unknown, please assist. ?

## 2021-12-03 NOTE — Telephone Encounter (Signed)
Patient called in wanting stress test in preporation for procedure. Advised I would need to send this to provider for review and consideration. He verbalized understanding with no further questions at this time.   ?

## 2021-12-03 NOTE — Telephone Encounter (Signed)
Thank you for forwarding.  Yes, we discussed this when he was seen in clinic, but he wanted to see ortho first.  Please help him to arrange for a lexiscan myoview at his convenience, in the setting of dyspnea on exertion and preoperative cardiovascular eval. ?

## 2021-12-03 NOTE — Telephone Encounter (Signed)
Left voicemail message that order has been entered and I would have someone from scheduling to reach out to set this up.  ?

## 2021-12-06 ENCOUNTER — Ambulatory Visit
Admission: RE | Admit: 2021-12-06 | Discharge: 2021-12-06 | Disposition: A | Discharge status: Home / Self Care | Payer: Self-pay | Source: Ambulatory Visit | Attending: Nurse Practitioner | Admitting: Nurse Practitioner

## 2021-12-06 DIAGNOSIS — Z01810 Encounter for preprocedural cardiovascular examination: Secondary | ICD-10-CM | POA: Insufficient documentation

## 2021-12-06 DIAGNOSIS — R0609 Other forms of dyspnea: Secondary | ICD-10-CM | POA: Insufficient documentation

## 2021-12-06 LAB — NM MYOCAR MULTI W/SPECT W/WALL MOTION / EF
Base ST Depression (mm): 0 mm
LV dias vol: 59 mL (ref 62–150)
LV sys vol: 16 mL
Nuc Stress EF: 73 %
Peak HR: 102 {beats}/min
Percent HR: 63 %
Rest HR: 95 {beats}/min
Rest Nuclear Isotope Dose: 10.6 mCi
SDS: 0
SRS: 2
SSS: 2
ST Depression (mm): 0 mm
Stress Nuclear Isotope Dose: 32.1 mCi
TID: 0.93

## 2021-12-06 MED ORDER — TECHNETIUM TC 99M TETROFOSMIN IV KIT
10.0000 | PACK | Freq: Once | INTRAVENOUS | Status: AC | PRN
  Administered 2021-12-06: 10.6 via INTRAVENOUS

## 2021-12-06 MED ORDER — REGADENOSON 0.4 MG/5ML IV SOLN
0.4000 mg | Freq: Once | INTRAVENOUS | Status: AC
  Administered 2021-12-06: 0.4 mg via INTRAVENOUS
  Filled 2021-12-06: qty 5

## 2021-12-06 MED ORDER — TECHNETIUM TC 99M TETROFOSMIN IV KIT
30.0000 | PACK | Freq: Once | INTRAVENOUS | Status: AC
  Administered 2021-12-06: 32.13 via INTRAVENOUS

## 2021-12-07 ENCOUNTER — Other Ambulatory Visit: Payer: Self-pay

## 2021-12-08 ENCOUNTER — Telehealth: Payer: Self-pay | Admitting: Nurse Practitioner

## 2021-12-08 ENCOUNTER — Other Ambulatory Visit: Payer: Self-pay

## 2021-12-08 NOTE — Telephone Encounter (Signed)
According to the recent note, "Degenerative joint disease/preoperative cardiovascular examination: Patient has been experiencing severe left shoulder pain and is aware that he has rotator cuff disease.  He has follow-up with Ortho next week.  He will require preoperative cardiovascular stress testing given limited activity and chronic dyspnea on exertion.  We will hold off on scheduling this until he is spoken with his surgeon.  I would also like for electrophysiology to weigh in prior to any declaration of preoperative risk." ? ?Recent nuclear stress test performed on 12/06/2021 was low risk, no evidence of ischemia, no evidence of infarction.  CT attenuation images show evidence of moderate aortic calcification and minimal coronary calcification. ? ?Dr. Quentin Ore, please review and comment if there is any contraindication to proceed with surgery from the EP perspective? ?

## 2021-12-08 NOTE — Telephone Encounter (Signed)
? ?  Pre-operative Risk Assessment  ?  ?Patient Name: Joshua Cole  ?DOB: Jul 17, 1961 ?MRN: [id number]  ? ?  ? ?Request for Surgical Clearance   ? ?Procedure:   Left shoulder Arthroscopy / debridement subacromial decompression possible rotator cuff repair  ? ?Date of Surgery:  Clearance TBD                              ?   ?Surgeon:  blackman ?Surgeon's Group or Practice Name:  [facility] ?Phone number:  [phone] ?Fax number:  [phone] ?  ?Type of Clearance Requested:   ?- Medical  ?  ?Type of Anesthesia:   general and block ?  ?Additional requests/questions:   ? ?Signed, ?Clarisse Gouge   ?12/08/2021, 2:54 PM  ? ?

## 2021-12-09 NOTE — Telephone Encounter (Signed)
? ? ?  Patient Name: Joshua Cole  ?DOB: 1961-08-14 ?MRN: [id number] ? ?Primary Cardiologist: Kathlyn Sacramento, MD ? ?Chart reviewed as part of pre-operative protocol coverage. Pt recently seen in cardiology clinic.  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  In the setting of chronic DOE w/ pending shoulder surgery, he underwent stress testing, which was low risk.  Case discussed with Drs. Fletcher Anon and Quentin Ore.  Though risk of perioperative ischemic event is low, he is at elevated risk for rhythm disturbances, such as recurrent complete heart block, in the setting of ongoing, currently asymptomatic junctional tachycardia.  As such, he will require close monitoring by anaesthesia in the immediate perioperative period given his history of conduction system disease.   ? ?The patient was advised that if he develops new symptoms prior to surgery to contact our office to arrange for a follow-up visit, and he verbalized understanding. ? ?Please call with questions. ? ?[name] Hodgkins, NP ?12/09/2021, 7:22 PM  ?

## 2021-12-10 ENCOUNTER — Telehealth: Payer: Self-pay | Admitting: *Deleted

## 2021-12-10 NOTE — Telephone Encounter (Signed)
Left voicemail message to call back for additional information.  ?

## 2021-12-10 NOTE — Telephone Encounter (Signed)
Reviewed providers had given feedback to his surgeon and he was appreciative for the update. He had no further questions at this time.  ?

## 2021-12-10 NOTE — Telephone Encounter (Signed)
Pt is returning call.  

## 2021-12-10 NOTE — Telephone Encounter (Signed)
-----   Message from Theora Gianotti, NP sent at 12/09/2021  6:55 PM EDT ----- ?Please let Joshua Cole know that his stress test looked good and did not suggest any blood flow issues to his heart muscle.  This is very reassuring.  I did reach out to both Drs. Fletcher Anon and [place] regarding his history of endocarditis and junctional tachycardia.  I will also reach out to Dr. [name].  He is at increased risk for cardiac complications related to his rhythm disturbance, thus great care will need to be taken perioperatively to ensure no issues, or at least be prepared for those that might occur (significant slowing of heart beat is of concern). ?

## 2021-12-20 ENCOUNTER — Telehealth: Payer: Self-pay | Admitting: Orthopaedic Surgery

## 2021-12-20 NOTE — Telephone Encounter (Signed)
Pt called and states he was sent of a stress test. He is wondering when someone is going to call him about surgery.  ? ?CB [phone]  ?

## 2021-12-21 NOTE — Telephone Encounter (Signed)
I called patient and advised clearance received.  I will be looking for available time at [facility] and call him back when I find opening and Dr. [name] can do surgery.  He stated he wanted first available, as soon as possible. ?

## 2021-12-22 ENCOUNTER — Other Ambulatory Visit: Payer: Self-pay | Admitting: Gerontology

## 2021-12-22 ENCOUNTER — Other Ambulatory Visit: Payer: Self-pay

## 2021-12-22 DIAGNOSIS — M25512 Pain in left shoulder: Secondary | ICD-10-CM

## 2021-12-23 ENCOUNTER — Other Ambulatory Visit: Payer: Self-pay

## 2021-12-23 MED ORDER — MELOXICAM 7.5 MG PO TABS
7.5000 mg | ORAL_TABLET | Freq: Every day | ORAL | 0 refills | Status: DC
  Filled 2021-12-23: qty 30, 30d supply, fill #0

## 2021-12-30 ENCOUNTER — Other Ambulatory Visit: Payer: Self-pay

## 2021-12-30 ENCOUNTER — Encounter: Payer: Self-pay | Admitting: Gerontology

## 2021-12-30 ENCOUNTER — Ambulatory Visit: Payer: Self-pay | Admitting: Gerontology

## 2021-12-30 VITALS — BP 144/87 | HR 95 | Temp 97.6°F | Resp 16 | Ht 70.0 in | Wt 235.6 lb

## 2021-12-30 DIAGNOSIS — M25512 Pain in left shoulder: Secondary | ICD-10-CM

## 2021-12-30 DIAGNOSIS — G629 Polyneuropathy, unspecified: Secondary | ICD-10-CM

## 2021-12-30 DIAGNOSIS — E119 Type 2 diabetes mellitus without complications: Secondary | ICD-10-CM

## 2021-12-30 LAB — POCT GLYCOSYLATED HEMOGLOBIN (HGB A1C): Hemoglobin A1C: 7.2 % — AB (ref 4.0–5.6)

## 2021-12-30 LAB — GLUCOSE, POCT (MANUAL RESULT ENTRY): POC Glucose: 126 mg/dl — AB (ref 70–99)

## 2021-12-30 MED ORDER — GABAPENTIN 100 MG PO CAPS
200.0000 mg | ORAL_CAPSULE | Freq: Two times a day (BID) | ORAL | 0 refills | Status: DC
  Filled 2021-12-30: qty 120, 30d supply, fill #0

## 2021-12-30 MED ORDER — MELOXICAM 7.5 MG PO TABS
7.5000 mg | ORAL_TABLET | Freq: Every day | ORAL | 0 refills | Status: DC
  Filled 2021-12-30 – 2022-01-20 (×3): qty 30, 30d supply, fill #0

## 2021-12-30 NOTE — Patient Instructions (Signed)

## 2021-12-30 NOTE — Progress Notes (Signed)
? ?Established Patient Office Visit ? ?Subjective   ?Patient ID: Joshua Cole, male    DOB: [birth year]  Age: 61 y.o. MRN: [id number] ? ?Chief Complaint  ?Patient presents with  ? Follow-up  ?  Patient states upper leg pain is on a scale of 7 out of 10  ? ? ?HPI ? ?Joshua Cole  is a 61 year old male who has history of arthritis, type 2 diabetes, DJD, Gout who presents for routine follow up. His HgbA1c done during visit increased from 6.1% to 7.2%. He states that he's compliant with his medication, denies side effects and continues to make healthy life style changes. He checks his blood glucose 4 times a week, and it's usually less than 130 mg/dl. His blood glucose was 126 mg/dl during visit. He denies hypo/hyperglycemic symptoms, but continues to experience neuropathic pain to bilateral upper thigh and taking 100 mg gabapentin minimally relieve symptoms.He was seen by Orthopedic Surgery on 12/02/21 by Dr [name] C.Y with regards to his chronic left shoulder torn rotator cuff and he's scheduled for surgery on 01/20/22. Overall, he states that he's doing well and offers no further complaint. ? ?Review of Systems  ?Constitutional: Negative.   ?Eyes: Negative.   ?Respiratory: Negative.    ?Cardiovascular: Negative.   ?Skin: Negative.   ?Neurological: Negative.   ?Psychiatric/Behavioral: Negative.    ? ?  ?Objective:  ?  ? ?BP (!) 144/87 (BP Location: Right Arm, Patient Position: Sitting, Cuff Size: Normal)   Pulse 95   Temp 97.6 ?F (36.4 ?C) (Oral)   Resp 16   Ht '5\' 10"'$  (1.778 m)   Wt 235 lb 9.6 oz (106.9 kg)   SpO2 98%   BMI 33.81 kg/m?  ? ? ?Physical Exam ?HENT:  ?   Head: Normocephalic and atraumatic.  ?   Mouth/Throat:  ?   Mouth: Mucous membranes are moist.  ?Eyes:  ?   Extraocular Movements: Extraocular movements intact.  ?   Conjunctiva/sclera: Conjunctivae normal.  ?   Pupils: Pupils are equal, round, and reactive to light.  ?Cardiovascular:  ?   Rate and Rhythm: Normal rate and regular rhythm.  ?    Pulses: Normal pulses.  ?   Heart sounds: Normal heart sounds.  ?Pulmonary:  ?   Effort: Pulmonary effort is normal.  ?   Breath sounds: Normal breath sounds.  ?Skin: ?   General: Skin is warm.  ?Neurological:  ?   General: No focal deficit present.  ?   Mental Status: He is alert and oriented to person, place, and time. Mental status is at baseline.  ?Psychiatric:     ?   Mood and Affect: Mood normal.     ?   Behavior: Behavior normal.     ?   Thought Content: Thought content normal.     ?   Judgment: Judgment normal.  ? ? ? ?Results for orders placed or performed in visit on 12/30/21  ?POCT Glucose (CBG)  ?Result Value Ref Range  ? POC Glucose 126 (A) 70 - 99 mg/dl  ?POCT HgB A1C  ?Result Value Ref Range  ? Hemoglobin A1C 7.2 (A) 4.0 - 5.6 %  ? HbA1c POC (<> result, manual entry)    ? HbA1c, POC (prediabetic range)    ? HbA1c, POC (controlled diabetic range)    ? ? ? ? ?The 10-year ASCVD risk score (Arnett DK, et al., 2019) is: 16.4% ? ?  ?Assessment & Plan:  ? ?1. Left shoulder pain, unspecified chronicity ?-  He will continue on current medication and was advised to follow up with Surgery on 01/20/22. ?- meloxicam (MOBIC) 7.5 MG tablet; Take 1 tablet (7.5 mg total) by mouth once daily.  Dispense: 30 tablet; Refill: 0 ? ?2. Peripheral polyneuropathy ?- His neuropathy is not controlled, gabapentin was increased to 200 mg bid, was advised to notify clinic for worsening symptoms. ?- gabapentin (NEURONTIN) 100 MG capsule; Take 2 capsules (200 mg total) by mouth 2 (two) times daily.  Dispense: 120 capsule; Refill: 0 ? ?3. Type 2 diabetes mellitus without complication, with long-term current use of insulin ([place]) ?- His HgbA1c was 7.2%, he was advised to check  fasting blood glucose at least daily, goal should be between 80-130 mg/dl. He was encouraged to continue on current medication, low carb/non concentrated sweet diet and exercise as tolerated. ?- POCT HgB A1C; Future ?- POCT Glucose (CBG); Future ?- POCT Glucose  (CBG) ?- POCT HgB A1C ? ? ? ?Return in about 3 months (around 03/31/2022), or if symptoms worsen or fail to improve.  ? ? ?[place]ack Jerold Coombe, NP ? ?

## 2022-01-03 ENCOUNTER — Other Ambulatory Visit: Payer: Self-pay

## 2022-01-04 ENCOUNTER — Other Ambulatory Visit: Payer: Self-pay

## 2022-01-13 ENCOUNTER — Other Ambulatory Visit: Payer: Self-pay

## 2022-01-14 ENCOUNTER — Other Ambulatory Visit: Payer: Self-pay

## 2022-01-20 ENCOUNTER — Other Ambulatory Visit: Payer: Self-pay

## 2022-01-20 ENCOUNTER — Other Ambulatory Visit: Payer: Self-pay | Admitting: Gerontology

## 2022-01-20 DIAGNOSIS — D62 Acute posthemorrhagic anemia: Secondary | ICD-10-CM

## 2022-01-20 MED ORDER — FERROUS SULFATE 325 (65 FE) MG PO TABS
325.0000 mg | ORAL_TABLET | Freq: Every day | ORAL | 0 refills | Status: DC
  Filled 2022-01-20: qty 30, 30d supply, fill #0

## 2022-01-21 ENCOUNTER — Other Ambulatory Visit: Payer: Self-pay

## 2022-01-27 ENCOUNTER — Encounter: Payer: Self-pay | Admitting: Orthopaedic Surgery

## 2022-02-01 ENCOUNTER — Other Ambulatory Visit: Payer: Self-pay | Admitting: Physician Assistant

## 2022-02-01 NOTE — Progress Notes (Signed)
Surgical Instructions    Your procedure is scheduled on Thursday June 1.  Report to [facility] Main Entrance "A" at 7:50 A.M., then check in with the Admitting office.  Call this number if you have problems the morning of surgery:  [phone]   If you have any questions prior to your surgery date call [phone]: Open Monday-Friday 8am-4pm    Remember:  Do not eat after midnight the night before your surgery  You may drink clear liquids until 6:50am the morning of your surgery.   Clear liquids allowed are: Water, Non-Citrus Juices (without pulp), Carbonated Beverages, Clear Tea, Black Coffee ONLY (NO MILK, CREAM OR POWDERED CREAMER of any kind), and Gatorade  The day of surgery (if you have diabetes): Drink ONE (1) 12 oz G2 given to you in your pre admission testing appointment by 6:50 am the morning of surgery. Drink in one sitting. Do not sip.  This drink was given to you during your hospital  pre-op appointment visit.  Nothing else to drink after completing the  12 oz bottle of G2.      If you have questions, please contact your surgeon's office.      Take these medicines the morning of surgery with A SIP OF WATER:  acetaminophen (TYLENOL) gabapentin (NEURONTIN)  IF NEEDED, TAKE: albuterol (PROVENTIL) (2.5 MG/3ML) loratadine (CLARITIN) polyvinyl alcohol (LIQUIFILM TEARS)  As of today, STOP taking any Aspirin (unless otherwise instructed by your surgeon) Aleve, Naproxen, Ibuprofen, Motrin, Advil, Goody's, BC's, all herbal medications, fish oil, and all vitamins.   WHAT DO I DO ABOUT MY DIABETES MEDICATION?  THE NIGHT BEFORE SURGERY, take 7 units of Insulin Glargine insulin.      HOW TO MANAGE YOUR DIABETES BEFORE AND AFTER SURGERY  Why is it important to control my blood sugar before and after surgery? Improving blood sugar levels before and after surgery helps healing and can limit problems. A way of improving blood sugar control is eating a healthy diet by:   Eating less sugar and carbohydrates  Increasing activity/exercise  Talking with your doctor about reaching your blood sugar goals High blood sugars (greater than 180 mg/dL) can raise your risk of infections and slow your recovery, so you will need to focus on controlling your diabetes during the weeks before surgery. Make sure that the doctor who takes care of your diabetes knows about your planned surgery including the date and location.  How do I manage my blood sugar before surgery? Check your blood sugar at least 4 times a day, starting 2 days before surgery, to make sure that the level is not too high or low.  Check your blood sugar the morning of your surgery when you wake up and every 2 hours until you get to the Short Stay unit.  If your blood sugar is less than 70 mg/dL, you will need to treat for low blood sugar: Do not take insulin. Treat a low blood sugar (less than 70 mg/dL) with  cup of clear juice (cranberry or apple), 4 glucose tablets, OR glucose gel. Recheck blood sugar in 15 minutes after treatment (to make sure it is greater than 70 mg/dL). If your blood sugar is not greater than 70 mg/dL on recheck, call [phone] for further instructions. Report your blood sugar to the short stay nurse when you get to Short Stay.  If you are admitted to the hospital after surgery: Your blood sugar will be checked by the staff and you will probably be given insulin  after surgery (instead of oral diabetes medicines) to make sure you have good blood sugar levels. The goal for blood sugar control after surgery is 80-180 mg/dL.           Do not wear jewelry or makeup Do not wear lotions, powders, perfumes/colognes, or deodorant. Do not shave 48 hours prior to surgery.  Men may shave face and neck. Do not bring valuables to the hospital. Do not wear nail polish, gel polish, artificial nails, or any other type of covering on natural nails (fingers and toes) If you have artificial nails or  gel coating that need to be removed by a nail salon, please have this removed prior to surgery. Artificial nails or gel coating may interfere with anesthesia's ability to adequately monitor your vital signs.  [facility] is not responsible for any belongings or valuables. .   Do NOT Smoke (Tobacco/Vaping)  24 hours prior to your procedure  If you use a CPAP at night, you may bring your mask for your overnight stay.   Contacts, glasses, hearing aids, dentures or partials may not be worn into surgery, please bring cases for these belongings   For patients admitted to the hospital, discharge time will be determined by your treatment team.   Patients discharged the day of surgery will not be allowed to drive home, and someone needs to stay with them for 24 hours.   SURGICAL WAITING ROOM VISITATION Patients having surgery or a procedure in a hospital may have two support people. Children under the age of 69 must have an adult with them who is not the patient. They may stay in the waiting area during the procedure and may switch out with other visitors. If the patient needs to stay at the hospital during part of their recovery, the visitor guidelines for inpatient rooms apply.  Please refer to the [facility] website for the visitor guidelines for Inpatients (after your surgery is over and you are in a regular room).       Special instructions:    Oral Hygiene is also important to reduce your risk of infection.  Remember - BRUSH YOUR TEETH THE MORNING OF SURGERY WITH YOUR REGULAR TOOTHPASTE   [place]- Preparing For Surgery  Before surgery, you can play an important role. Because skin is not sterile, your skin needs to be as free of germs as possible. You can reduce the number of germs on your skin by washing with CHG (chlorahexidine gluconate) Soap before surgery.  CHG is an antiseptic cleaner which kills germs and bonds with the skin to continue killing germs even after washing.      Please do not use if you have an allergy to CHG or antibacterial soaps. If your skin becomes reddened/irritated stop using the CHG.  Do not shave (including legs and underarms) for at least 48 hours prior to first CHG shower. It is OK to shave your face.  Please follow these instructions carefully.     Shower the NIGHT BEFORE SURGERY and the MORNING OF SURGERY with CHG Soap.   If you chose to wash your hair, wash your hair first as usual with your normal shampoo. After you shampoo, rinse your hair and body thoroughly to remove the shampoo.  Then [employer] and genitals (private parts) with your normal soap and rinse thoroughly to remove soap.  After that Use CHG Soap as you would any other liquid soap. You can apply CHG directly to the skin and wash gently with a  scrungie or a clean washcloth.   Apply the CHG Soap to your body ONLY FROM THE NECK DOWN.  Do not use on open wounds or open sores. Avoid contact with your eyes, ears, mouth and genitals (private parts). Wash Face and genitals (private parts)  with your normal soap.   Wash thoroughly, paying special attention to the area where your surgery will be performed.  Thoroughly rinse your body with warm water from the neck down.  DO NOT shower/wash with your normal soap after using and rinsing off the CHG Soap.  Pat yourself dry with a CLEAN TOWEL.  Wear CLEAN PAJAMAS to bed the night before surgery  Place CLEAN SHEETS on your bed the night before your surgery  DO NOT SLEEP WITH PETS.   Day of Surgery:  Take a shower with CHG soap. Wear Clean/Comfortable clothing the morning of surgery Do not apply any deodorants/lotions.   Remember to brush your teeth WITH YOUR REGULAR TOOTHPASTE.    If you received a COVID test during your pre-op visit, it is requested that you wear a mask when out in public, stay away from anyone that may not be feeling well, and notify your surgeon if you develop symptoms. If you have been in contact  with anyone that has tested positive in the last 10 days, please notify your surgeon.    Please read over the following fact sheets that you were given.

## 2022-02-02 ENCOUNTER — Encounter (HOSPITAL_COMMUNITY): Payer: Self-pay

## 2022-02-02 ENCOUNTER — Encounter (HOSPITAL_COMMUNITY)
Admission: RE | Admit: 2022-02-02 | Discharge: 2022-02-02 | Disposition: A | Discharge status: Home / Self Care | Payer: Self-pay | Source: Ambulatory Visit | Attending: Orthopaedic Surgery | Admitting: Orthopaedic Surgery

## 2022-02-02 ENCOUNTER — Other Ambulatory Visit: Payer: Self-pay

## 2022-02-02 VITALS — BP 142/86 | HR 99 | Temp 98.2°F | Resp 17 | Ht 70.0 in | Wt 238.3 lb

## 2022-02-02 DIAGNOSIS — Z01812 Encounter for preprocedural laboratory examination: Secondary | ICD-10-CM | POA: Insufficient documentation

## 2022-02-02 DIAGNOSIS — Z01818 Encounter for other preprocedural examination: Secondary | ICD-10-CM

## 2022-02-02 DIAGNOSIS — F1011 Alcohol abuse, in remission: Secondary | ICD-10-CM | POA: Insufficient documentation

## 2022-02-02 DIAGNOSIS — E119 Type 2 diabetes mellitus without complications: Secondary | ICD-10-CM | POA: Insufficient documentation

## 2022-02-02 DIAGNOSIS — Z8679 Personal history of other diseases of the circulatory system: Secondary | ICD-10-CM | POA: Insufficient documentation

## 2022-02-02 DIAGNOSIS — Z794 Long term (current) use of insulin: Secondary | ICD-10-CM | POA: Insufficient documentation

## 2022-02-02 HISTORY — DX: Unspecified asthma, uncomplicated: J45.909

## 2022-02-02 HISTORY — DX: Cardiac arrhythmia, unspecified: I49.9

## 2022-02-02 LAB — CBC
HCT: 44.9 % (ref 39.0–52.0)
Hemoglobin: 14.2 g/dL (ref 13.0–17.0)
MCH: 31.3 pg (ref 26.0–34.0)
MCHC: 31.6 g/dL (ref 30.0–36.0)
MCV: 99.1 fL (ref 80.0–100.0)
Platelets: UNDETERMINED 10*3/uL (ref 150–400)
RBC: 4.53 MIL/uL (ref 4.22–5.81)
RDW: 15.7 % — ABNORMAL HIGH (ref 11.5–15.5)
WBC: 7.4 10*3/uL (ref 4.0–10.5)
nRBC: 0 % (ref 0.0–0.2)

## 2022-02-02 LAB — GLUCOSE, CAPILLARY: Glucose-Capillary: 192 mg/dL — ABNORMAL HIGH (ref 70–99)

## 2022-02-02 LAB — BASIC METABOLIC PANEL
Anion gap: 5 (ref 5–15)
BUN: 8 mg/dL (ref 6–20)
CO2: 27 mmol/L (ref 22–32)
Calcium: 9.6 mg/dL (ref 8.9–10.3)
Chloride: 103 mmol/L (ref 98–111)
Creatinine, Ser: 0.67 mg/dL (ref 0.61–1.24)
GFR, Estimated: >60 mL/min (ref >60)
Glucose, Bld: 184 mg/dL — ABNORMAL HIGH (ref 70–99)
Potassium: 4.2 mmol/L (ref 3.5–5.1)
Sodium: 135 mmol/L (ref 135–145)

## 2022-02-02 NOTE — Progress Notes (Signed)
PCP - Open [place] Cardiologist - Dr. Kathlyn Sacramento  PPM/ICD - denies   Chest x-ray - 07/05/21 EKG - 11/24/21 Stress Test - 12/06/21 ECHO - 10/07/21 Cardiac Cath - denies  Sleep Study - denies  DM- type 2 Pt does not check CBG at home  ASA/Blood Thinner Instructions: n/a   ERAS Protcol - yes PRE-SURGERY G2- given at PAT  COVID TEST- n/a   Anesthesia review: yes, cardiac hx  Patient denies shortness of breath, fever, cough and chest pain at PAT appointment   All instructions explained to the patient, with a verbal understanding of the material. Patient agrees to go over the instructions while at home for a better understanding. Patient also instructed to notify surgeon of any contact with COVID+ person or if he develops any symptoms. The opportunity to ask questions was provided.

## 2022-02-03 NOTE — Progress Notes (Signed)
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  Patient was eval by CT surgery during admission who recommended conservative management only.  He completed antibiotic course in December and had negative blood cultures at that time, and PICC line was removed.  Patient has continued to follow-up with general and EP cardiology as an outpatient.  CHB/accelerated junctional rhythm was discussed on follow-up with [name] Hodgkins, NP on 11/24/2021.  Per note, " Complete heart block/accelerated junctional rhythm: This was noted during hospitalization.  He has had acceptable junctional escape rhythm, and has been relatively locked into the 107 bpm dating back several months.  He denies presyncope, syncope, or palpitations.  He was last seen by electrophysiology in December with hesitancy to consider pacing given recent management of endocarditis and tricuspid valve vegetation.  We will arrange for follow-up with EP."  Subsequent cardiac clearance per telephone encounter by [name] Hodgkins, NP on 12/09/21, "Chart reviewed as part of pre-operative protocol coverage. Pt recently seen in cardiology clinic.  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  In the setting of chronic DOE w/ pending shoulder surgery, he underwent stress testing, which was low risk.  Case discussed with Drs. Fletcher Anon and Quentin Ore.  Though risk of perioperative ischemic event is low, he is at elevated risk for rhythm disturbances, such as recurrent complete heart block, in the setting of ongoing, currently asymptomatic junctional tachycardia.  As such, he will require close monitoring by anaesthesia in the immediate perioperative period given his history of conduction system disease."  History of EtOH  abuse.  IDDM 2, last A1c 7.2 on 12/30/2021.  Preop labs reviewed, unremarkable.  Platelets unable to be calculated secondary to clumping.  Last platelets on 09/20/2021 were mildly low at 139k. Results were called to surgeon's office.   EKG 11/24/21: Accelerated junctional rhythm. Rate 107.  Nuclear stress 12/06/21:   The study is normal. The study is low risk.   No ST deviation was noted.   LV perfusion is normal. There is no evidence of ischemia. There is no evidence of infarction.   Left ventricular function is normal. End diastolic cavity size is normal. End systolic cavity size is normal.   CT attenuation images show evidence of moderate aortic calcifications and minimal coronary calcifications.   TTE 10/07/21:   The study is normal. The study is low risk.   No ST deviation was noted.   LV perfusion is normal. There is no evidence of ischemia. There is no evidence of infarction.   Left ventricular function is normal. End diastolic cavity size is normal. End systolic cavity size is normal.   CT attenuation images show evidence of moderate aortic calcifications and minimal coronary calcifications.    Wynonia Musty [facility] Short Stay Center/Anesthesiology Phone [phone] 02/03/2022 3:24 PM

## 2022-02-03 NOTE — Anesthesia Preprocedure Evaluation (Addendum)
Anesthesia Evaluation  Patient identified by MRN, date of birth, ID band Patient awake    Reviewed: Allergy & Precautions, NPO status , Patient's Chart, lab work & pertinent test results  Airway Mallampati: II  TM Distance: >3 FB Neck ROM: Full    Dental  (+) Poor Dentition,    Pulmonary asthma , former smoker,    Pulmonary exam normal breath sounds clear to auscultation       Cardiovascular Exercise Tolerance: Good Normal cardiovascular exam+ dysrhythmias (intermittent complete heart block; Cardiology following with no plans for intervention) (-) pacemaker Rhythm:Regular Rate:Bradycardia  See cardiology notes   Neuro/Psych  Headaches,  Neuromuscular disease (peripheral neuropathy) negative psych ROS   GI/Hepatic Neg liver ROS, PUD,   Endo/Other  negative endocrine ROSdiabetes, Type 2  Renal/GU negative Renal ROS  negative genitourinary   Musculoskeletal  (+) Arthritis , Osteoarthritis,    Abdominal   Peds negative pediatric ROS (+)  Hematology  (+) Blood dyscrasia, anemia ,   Anesthesia Other Findings   Reproductive/Obstetrics negative OB ROS                            Anesthesia Physical Anesthesia Plan  ASA: 4  Anesthesia Plan: General and Regional   Post-op Pain Management: Tylenol PO (pre-op)*   Induction: Intravenous  PONV Risk Score and Plan: 2 and Treatment may vary due to age or medical condition  Airway Management Planned: Oral ETT  Additional Equipment: None  Intra-op Plan:   Post-operative Plan: Extubation in OR  Informed Consent: I have reviewed the patients History and Physical, chart, labs and discussed the procedure including the risks, benefits and alternatives for the proposed anesthesia with the patient or authorized representative who has indicated his/her understanding and acceptance.     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  Patient was eval by CT surgery during admission who recommended conservative management only.  He completed antibiotic course in December and had negative blood cultures at that time, and PICC line was removed.  Patient has continued to follow-up with general and EP cardiology as an outpatient.  CHB/accelerated junctional rhythm was discussed on follow-up with [name] Hodgkins, NP on 11/24/2021.  Per note, "Complete heart block/accelerated junctional rhythm: This was noted during hospitalization. He has had acceptable junctional escape rhythm, and has been relatively locked into the 107 bpm dating back several months. He denies presyncope, syncope, or palpitations. He was last seen by electrophysiology in December with hesitancy to consider pacing given recent management of endocarditis and tricuspid valve vegetation. We will arrange for follow-up with EP."  Subsequent cardiac clearance per telephone encounter by [name] Hodgkins, NP on 12/09/21, "Chart reviewed as part of pre-operative protocol coverage.Pt recently seen in cardiology clinic. 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 In the setting of chronic DOE w/ pending shoulder surgery, he underwent stress testing, which was low risk. Case discussed with Drs. Fletcher Anon and Quentin Ore. Though risk of perioperative ischemic event is low, he is at elevated risk for rhythm disturbances, such as recurrent complete heart block, in the setting of ongoing, currently asymptomatic junctional tachycardia. As such, hewill require close monitoringby anaesthesia in the immediate perioperative period given  his history  of conduction system disease."  History of EtOH abuse.  IDDM 2, last A1c 7.2 on 12/30/2021.  Preop labs reviewed, unremarkable.  Platelets unable to be calculated secondary to clumping.  Last platelets on 09/20/2021 were mildly low at 139k. Results were called to surgeon's office.   EKG 11/24/21: Accelerated junctional rhythm. Rate 107.  Nuclear stress 12/06/21: . The study is normal. The study is low risk. . No ST deviation was noted. . LV perfusion is normal. There is no evidence of ischemia. There is no evidence of infarction. . Left ventricular function is normal. End diastolic cavity size is normal. End systolic cavity size is normal. . CT attenuation images show evidence of moderate aortic calcifications and minimal coronary calcifications.  TTE 10/07/21: . The study is normal. The study is low risk. . No ST deviation was noted. . LV perfusion is normal. There is no evidence of ischemia. There is no evidence of infarction. . Left ventricular function is normal. End diastolic cavity size is normal. End systolic cavity size is normal. . CT attenuation images show evidence of moderate aortic calcifications and minimal coronary calcifications. )       Anesthesia Quick Evaluation

## 2022-02-08 ENCOUNTER — Other Ambulatory Visit: Payer: Self-pay | Admitting: Gerontology

## 2022-02-08 ENCOUNTER — Other Ambulatory Visit: Payer: Self-pay

## 2022-02-08 DIAGNOSIS — G629 Polyneuropathy, unspecified: Secondary | ICD-10-CM

## 2022-02-08 DIAGNOSIS — E119 Type 2 diabetes mellitus without complications: Secondary | ICD-10-CM

## 2022-02-10 ENCOUNTER — Encounter (HOSPITAL_COMMUNITY): Payer: Self-pay | Admitting: Orthopaedic Surgery

## 2022-02-10 ENCOUNTER — Ambulatory Visit (HOSPITAL_BASED_OUTPATIENT_CLINIC_OR_DEPARTMENT_OTHER): Payer: Medicaid Other | Admitting: Physician Assistant

## 2022-02-10 ENCOUNTER — Other Ambulatory Visit: Payer: Self-pay | Admitting: Gerontology

## 2022-02-10 ENCOUNTER — Ambulatory Visit (HOSPITAL_COMMUNITY)
Admission: RE | Admit: 2022-02-10 | Discharge: 2022-02-10 | Disposition: A | Discharge status: Home / Self Care | Payer: Medicaid Other | Source: Ambulatory Visit | Attending: Orthopaedic Surgery | Admitting: Orthopaedic Surgery

## 2022-02-10 ENCOUNTER — Other Ambulatory Visit: Payer: Self-pay

## 2022-02-10 ENCOUNTER — Encounter (HOSPITAL_COMMUNITY)
Admission: RE | Disposition: A | Discharge status: Home / Self Care | Payer: Self-pay | Source: Ambulatory Visit | Attending: Orthopaedic Surgery

## 2022-02-10 ENCOUNTER — Other Ambulatory Visit (HOSPITAL_COMMUNITY): Payer: Self-pay

## 2022-02-10 ENCOUNTER — Ambulatory Visit (HOSPITAL_COMMUNITY): Payer: Medicaid Other | Admitting: Physician Assistant

## 2022-02-10 DIAGNOSIS — M25512 Pain in left shoulder: Secondary | ICD-10-CM

## 2022-02-10 DIAGNOSIS — G629 Polyneuropathy, unspecified: Secondary | ICD-10-CM

## 2022-02-10 DIAGNOSIS — E119 Type 2 diabetes mellitus without complications: Secondary | ICD-10-CM | POA: Diagnosis not present

## 2022-02-10 DIAGNOSIS — M7542 Impingement syndrome of left shoulder: Secondary | ICD-10-CM | POA: Diagnosis not present

## 2022-02-10 DIAGNOSIS — M7552 Bursitis of left shoulder: Secondary | ICD-10-CM

## 2022-02-10 DIAGNOSIS — Z87891 Personal history of nicotine dependence: Secondary | ICD-10-CM | POA: Insufficient documentation

## 2022-02-10 DIAGNOSIS — M24612 Ankylosis, left shoulder: Secondary | ICD-10-CM | POA: Diagnosis not present

## 2022-02-10 DIAGNOSIS — M75122 Complete rotator cuff tear or rupture of left shoulder, not specified as traumatic: Secondary | ICD-10-CM

## 2022-02-10 DIAGNOSIS — M659 Synovitis and tenosynovitis, unspecified: Secondary | ICD-10-CM

## 2022-02-10 DIAGNOSIS — M7522 Bicipital tendinitis, left shoulder: Secondary | ICD-10-CM

## 2022-02-10 HISTORY — PX: SHOULDER ARTHROSCOPY WITH ROTATOR CUFF REPAIR AND SUBACROMIAL DECOMPRESSION: SHX5686

## 2022-02-10 LAB — GLUCOSE, CAPILLARY
Glucose-Capillary: 159 mg/dL — ABNORMAL HIGH (ref 70–99)
Glucose-Capillary: 135 mg/dL — ABNORMAL HIGH (ref 70–99)

## 2022-02-10 SURGERY — SHOULDER ARTHROSCOPY WITH ROTATOR CUFF REPAIR AND SUBACROMIAL DECOMPRESSION
Anesthesia: Regional | Site: Shoulder | Laterality: Left

## 2022-02-10 MED ORDER — EPHEDRINE SULFATE-NACL 50-0.9 MG/10ML-% IV SOSY
PREFILLED_SYRINGE | INTRAVENOUS | Status: DC | PRN
  Administered 2022-02-10 (×8): 2.5 mg via INTRAVENOUS

## 2022-02-10 MED ORDER — BUPIVACAINE LIPOSOME 1.3 % IJ SUSP
INTRAMUSCULAR | Status: DC | PRN
  Administered 2022-02-10: 10 mL via PERINEURAL

## 2022-02-10 MED ORDER — LACTATED RINGERS IV SOLN
INTRAVENOUS | Status: DC
Start: 2022-02-10 — End: 2022-02-10

## 2022-02-10 MED ORDER — FENTANYL CITRATE (PF) 100 MCG/2ML IJ SOLN
INTRAMUSCULAR | Status: AC
  Administered 2022-02-10: 50 ug via INTRAVENOUS
  Filled 2022-02-10: qty 2

## 2022-02-10 MED ORDER — ONDANSETRON HCL 4 MG/2ML IJ SOLN
INTRAMUSCULAR | Status: DC | PRN
  Administered 2022-02-10: 4 mg via INTRAVENOUS

## 2022-02-10 MED ORDER — FENTANYL CITRATE (PF) 250 MCG/5ML IJ SOLN
INTRAMUSCULAR | Status: DC | PRN
  Administered 2022-02-10: 50 ug via INTRAVENOUS

## 2022-02-10 MED ORDER — CEFAZOLIN SODIUM-DEXTROSE 2-4 GM/100ML-% IV SOLN
INTRAVENOUS | Status: AC
  Filled 2022-02-10: qty 100

## 2022-02-10 MED ORDER — ROCURONIUM BROMIDE 10 MG/ML (PF) SYRINGE
PREFILLED_SYRINGE | INTRAVENOUS | Status: AC
  Filled 2022-02-10: qty 10

## 2022-02-10 MED ORDER — CEFAZOLIN SODIUM-DEXTROSE 2-4 GM/100ML-% IV SOLN
2.0000 g | INTRAVENOUS | Status: AC
  Administered 2022-02-10: 2 g via INTRAVENOUS

## 2022-02-10 MED ORDER — AMISULPRIDE (ANTIEMETIC) 5 MG/2ML IV SOLN: 10.0000 mg | Freq: Once | INTRAVENOUS | Status: DC | PRN

## 2022-02-10 MED ORDER — FENTANYL CITRATE (PF) 100 MCG/2ML IJ SOLN: 50.0000 ug | Freq: Once | INTRAMUSCULAR | Status: AC

## 2022-02-10 MED ORDER — FENTANYL CITRATE (PF) 250 MCG/5ML IJ SOLN
INTRAMUSCULAR | Status: AC
  Filled 2022-02-10: qty 5

## 2022-02-10 MED ORDER — PROPOFOL 10 MG/ML IV BOLUS
INTRAVENOUS | Status: AC
  Filled 2022-02-10: qty 20

## 2022-02-10 MED ORDER — ORAL CARE MOUTH RINSE: 15.0000 mL | Freq: Once | OROMUCOSAL | Status: AC

## 2022-02-10 MED ORDER — MIDAZOLAM HCL 2 MG/2ML IJ SOLN
INTRAMUSCULAR | Status: AC
  Administered 2022-02-10: 1 mg via INTRAVENOUS
  Filled 2022-02-10: qty 2

## 2022-02-10 MED ORDER — OXYCODONE HCL 5 MG PO TABS
5.0000 mg | ORAL_TABLET | Freq: Four times a day (QID) | ORAL | 0 refills | Status: DC | PRN
  Filled 2022-02-10: qty 30, 4d supply, fill #0

## 2022-02-10 MED ORDER — ACETAMINOPHEN 500 MG PO TABS: 1000.0000 mg | ORAL_TABLET | Freq: Once | ORAL | Status: DC

## 2022-02-10 MED ORDER — FENTANYL CITRATE (PF) 100 MCG/2ML IJ SOLN: 25.0000 ug | INTRAMUSCULAR | Status: DC | PRN

## 2022-02-10 MED ORDER — BUPIVACAINE-EPINEPHRINE (PF) 0.25% -1:200000 IJ SOLN
INTRAMUSCULAR | Status: AC
  Filled 2022-02-10: qty 30

## 2022-02-10 MED ORDER — ACETAMINOPHEN 500 MG PO TABS
ORAL_TABLET | ORAL | Status: AC
  Filled 2022-02-10: qty 2

## 2022-02-10 MED ORDER — MIDAZOLAM HCL 2 MG/2ML IJ SOLN
INTRAMUSCULAR | Status: AC
  Filled 2022-02-10: qty 2

## 2022-02-10 MED ORDER — PHENYLEPHRINE HCL-NACL 20-0.9 MG/250ML-% IV SOLN
INTRAVENOUS | Status: DC | PRN
  Administered 2022-02-10: 25 ug/min via INTRAVENOUS

## 2022-02-10 MED ORDER — DEXAMETHASONE SODIUM PHOSPHATE 10 MG/ML IJ SOLN
INTRAMUSCULAR | Status: AC
  Filled 2022-02-10: qty 1

## 2022-02-10 MED ORDER — EPINEPHRINE PF 1 MG/ML IJ SOLN
INTRAMUSCULAR | Status: AC
  Filled 2022-02-10: qty 1

## 2022-02-10 MED ORDER — SODIUM CHLORIDE 0.9 % IR SOLN
Status: DC | PRN
  Administered 2022-02-10: 12000 mL

## 2022-02-10 MED ORDER — BUPIVACAINE HCL (PF) 0.5 % IJ SOLN
INTRAMUSCULAR | Status: DC | PRN
  Administered 2022-02-10: 15 mL via PERINEURAL

## 2022-02-10 MED ORDER — LIDOCAINE 2% (20 MG/ML) 5 ML SYRINGE
INTRAMUSCULAR | Status: DC | PRN
  Administered 2022-02-10: 60 mg via INTRAVENOUS

## 2022-02-10 MED ORDER — LIDOCAINE 2% (20 MG/ML) 5 ML SYRINGE
INTRAMUSCULAR | Status: AC
Start: 2022-02-10 — End: ?
  Filled 2022-02-10: qty 5

## 2022-02-10 MED ORDER — SUGAMMADEX SODIUM 200 MG/2ML IV SOLN
INTRAVENOUS | Status: DC | PRN
  Administered 2022-02-10: 200 mg via INTRAVENOUS

## 2022-02-10 MED ORDER — OXYCODONE HCL 5 MG PO TABS: 5.0000 mg | ORAL_TABLET | Freq: Once | ORAL | Status: DC | PRN

## 2022-02-10 MED ORDER — INSULIN ASPART 100 UNIT/ML IJ SOLN: 0.0000 [IU] | INTRAMUSCULAR | Status: DC | PRN

## 2022-02-10 MED ORDER — ONDANSETRON HCL 4 MG/2ML IJ SOLN
INTRAMUSCULAR | Status: AC
  Filled 2022-02-10: qty 2

## 2022-02-10 MED ORDER — CHLORHEXIDINE GLUCONATE 0.12 % MT SOLN
OROMUCOSAL | Status: AC
  Administered 2022-02-10: 15 mL via OROMUCOSAL
  Filled 2022-02-10: qty 15

## 2022-02-10 MED ORDER — MIDAZOLAM HCL 2 MG/2ML IJ SOLN: 1.0000 mg | Freq: Once | INTRAMUSCULAR | Status: AC

## 2022-02-10 MED ORDER — CHLORHEXIDINE GLUCONATE 0.12 % MT SOLN: 15.0000 mL | Freq: Once | OROMUCOSAL | Status: AC

## 2022-02-10 MED ORDER — PROPOFOL 10 MG/ML IV BOLUS
INTRAVENOUS | Status: DC | PRN
  Administered 2022-02-10: 120 mg via INTRAVENOUS

## 2022-02-10 MED ORDER — OXYCODONE HCL 5 MG/5ML PO SOLN: 5.0000 mg | Freq: Once | ORAL | Status: DC | PRN

## 2022-02-10 MED ORDER — ROCURONIUM BROMIDE 10 MG/ML (PF) SYRINGE
PREFILLED_SYRINGE | INTRAVENOUS | Status: DC | PRN
  Administered 2022-02-10: 60 mg via INTRAVENOUS

## 2022-02-10 MED ORDER — ONDANSETRON HCL 4 MG/2ML IJ SOLN: 4.0000 mg | Freq: Once | INTRAMUSCULAR | Status: DC | PRN

## 2022-02-10 MED FILL — Meloxicam Tab 7.5 MG: ORAL | 30 days supply | Qty: 30 | Fill #0 | Status: CN

## 2022-02-10 MED FILL — Gabapentin Cap 100 MG: ORAL | 30 days supply | Qty: 120 | Fill #0 | Status: AC

## 2022-02-10 SURGICAL SUPPLY — 56 items
BAG COUNTER SPONGE SURGICOUNT (BAG) ×2 IMPLANT
BLADE CLIPPER SURG (BLADE) IMPLANT
BLADE EXCALIBUR 4.0X13 (MISCELLANEOUS) ×2 IMPLANT
BLADE SURG 11 STRL SS (BLADE) ×2 IMPLANT
BURR OVAL 8 FLU 4.0X13 (MISCELLANEOUS) ×2 IMPLANT
CANNULA 5.75X7 CRYSTAL CLEAR (CANNULA) IMPLANT
CANNULA SHOULDER 7CM (CANNULA) ×4 IMPLANT
CANNULA TWIST IN 8.25X7CM (CANNULA) IMPLANT
COVER SURGICAL LIGHT HANDLE (MISCELLANEOUS) ×2 IMPLANT
DECANTER SPIKE VIAL GLASS SM (MISCELLANEOUS) IMPLANT
DRAPE INCISE IOBAN 66X45 STRL (DRAPES) IMPLANT
DRAPE SHOULDER BEACH CHAIR (DRAPES) ×2 IMPLANT
DRAPE SURG 17X23 STRL (DRAPES) ×2 IMPLANT
DRAPE U-SHAPE 47X51 STRL (DRAPES) ×2 IMPLANT
DRSG PAD ABDOMINAL 8X10 ST (GAUZE/BANDAGES/DRESSINGS) ×4 IMPLANT
DURAPREP 26ML APPLICATOR (WOUND CARE) ×2 IMPLANT
ELECT REM PT RETURN 9FT ADLT (ELECTROSURGICAL)
ELECTRODE REM PT RTRN 9FT ADLT (ELECTROSURGICAL) IMPLANT
GAUZE SPONGE 4X4 12PLY STRL (GAUZE/BANDAGES/DRESSINGS) ×2 IMPLANT
GAUZE XEROFORM 1X8 LF (GAUZE/BANDAGES/DRESSINGS) ×3 IMPLANT
GLOVE BIO SURGEON STRL SZ8 (GLOVE) ×2 IMPLANT
GLOVE BIOGEL PI IND STRL 8 (GLOVE) ×1 IMPLANT
GLOVE BIOGEL PI INDICATOR 8 (GLOVE) ×1
GLOVE ORTHO TXT STRL SZ7.5 (GLOVE) ×2 IMPLANT
GOWN STRL REUS W/ TWL LRG LVL3 (GOWN DISPOSABLE) ×2 IMPLANT
GOWN STRL REUS W/ TWL XL LVL3 (GOWN DISPOSABLE) ×4 IMPLANT
GOWN STRL REUS W/TWL LRG LVL3 (GOWN DISPOSABLE) ×4
GOWN STRL REUS W/TWL XL LVL3 (GOWN DISPOSABLE) ×8
KIT BASIN OR (CUSTOM PROCEDURE TRAY) ×2 IMPLANT
KIT SHOULDER TRACTION (DRAPES) ×2 IMPLANT
KIT TURNOVER KIT B (KITS) ×2 IMPLANT
MANIFOLD NEPTUNE II (INSTRUMENTS) ×2 IMPLANT
NDL 1/2 CIR CATGUT .05X1.09 (NEEDLE) IMPLANT
NDL HYPO 25GX1X1/2 BEV (NEEDLE) IMPLANT
NDL SCORPION (NEEDLE) IMPLANT
NDL SPNL 18GX3.5 QUINCKE PK (NEEDLE) ×1 IMPLANT
NEEDLE 1/2 CIR CATGUT .05X1.09 (NEEDLE) IMPLANT
NEEDLE HYPO 25GX1X1/2 BEV (NEEDLE) IMPLANT
NEEDLE SCORPION (NEEDLE) IMPLANT
NEEDLE SPNL 18GX3.5 QUINCKE PK (NEEDLE) ×2 IMPLANT
NS IRRIG 1000ML POUR BTL (IV SOLUTION) ×2 IMPLANT
PACK SHOULDER (CUSTOM PROCEDURE TRAY) ×2 IMPLANT
PAD ARMBOARD 7.5X6 YLW CONV (MISCELLANEOUS) ×4 IMPLANT
PORT APPOLLO RF 90DEGREE MULTI (SURGICAL WAND) ×1 IMPLANT
PROBE APOLLO 90XL (SURGICAL WAND) ×2 IMPLANT
RESTRAINT HEAD UNIVERSAL NS (MISCELLANEOUS) ×2 IMPLANT
SLING ARM FOAM STRAP LRG (SOFTGOODS) ×1 IMPLANT
SPONGE T-LAP 4X18 ~~LOC~~+RFID (SPONGE) ×2 IMPLANT
STAPLER VISISTAT 35W (STAPLE) IMPLANT
STRIP CLOSURE SKIN 1/2X4 (GAUZE/BANDAGES/DRESSINGS) IMPLANT
SUT ETHILON 3 0 PS 1 (SUTURE) IMPLANT
SYR CONTROL 10ML LL (SYRINGE) IMPLANT
TOWEL GREEN STERILE (TOWEL DISPOSABLE) ×2 IMPLANT
TOWEL GREEN STERILE FF (TOWEL DISPOSABLE) ×2 IMPLANT
TUBING ARTHROSCOPY IRRIG 16FT (MISCELLANEOUS) ×2 IMPLANT
WATER STERILE IRR 1000ML POUR (IV SOLUTION) ×2 IMPLANT

## 2022-02-10 NOTE — Brief Op Note (Signed)
02/10/2022  11:25 AM  PATIENT:  Joshua Cole  61 y.o. male  PRE-OPERATIVE DIAGNOSIS:  left shoulder full-thickness rotator cuff tear  POST-OPERATIVE DIAGNOSIS:  left shoulder full-thickness rotator cuff tear  PROCEDURE:  Procedure(s): LEFT SHOULDER ARTHROSCOPY WITH EXTENSIVE DEBRIDEMENT, SUBACROMIAL DECOMPRESSION (Left)  SURGEON:  Surgeon(s) and Role:    Mcarthur Rossetti, MD - Primary  PHYSICIAN ASSISTANT:  [name], PA-C  ANESTHESIA:   regional and general  COUNTS:  YES  DICTATION: .Other Dictation: Dictation Number [id number]  PLAN OF CARE: Discharge to home after PACU  PATIENT DISPOSITION:  PACU - hemodynamically stable.   Delay start of Pharmacological VTE agent (>24hrs) due to surgical blood loss or risk of bleeding: no

## 2022-02-10 NOTE — Op Note (Signed)
NAMEESWIN, WORRELL MEDICAL RECORD NO: [id number] ACCOUNT NO: [id number] DATE OF BIRTH: 22-Jun-1961 FACILITY: [facility] LOCATION: [facility] PHYSICIAN: Lind Guest. [name], MD  Operative Report   DATE OF PROCEDURE: 02/10/2022  PREOPERATIVE DIAGNOSIS:  Suspected full thickness rotator cuff, left shoulder with arthrofibrosis.  POSTOPERATIVE DIAGNOSES:  Severe impingement syndrome, left shoulder with extensive bursitis and tendinitis as well as synovitis.  PROCEDURES:  Left shoulder arthroscopy with extensive debridement and subacromial decompression.  FINDINGS:  No rotator cuff tear with extensive synovitis, bursitis and tendinosis of the rotator cuff.  SURGEON:  Lind Guest. [name], MD  ASSISTANT:  Erskine Emery, PA-C  ANESTHESIA: 1.  Left upper extremity regional anesthesia. 2.  General.  ESTIMATED BLOOD LOSS:  Minimal.  COMPLICATIONS:  None.  INDICATIONS:  The patient is a 61 year old gentleman with significant limitations of range of motion of his left shoulder with arthrofibrosis based on clinical exam in the office.  His pain has been severe and he has failed all forms of conservative  treatment with his left shoulder.  MRI of the shoulder showed a full thickness rotator cuff tear with no retraction and no atrophy, but I suspect based on what we saw intraoperatively that this was likely severe tendinosis and bursitis.  In the office,  he would not really let me get his shoulder through any type of motion and he has already been through therapy and injections in his shoulder and failed conservative treatment.  At this point, we recommended shoulder arthroscopy with manipulation and  subacromial decompression, and repair of the rotator cuff if necessary.  He is someone that we did need to get an extensive cardiac clearance for given his past medical history and endocarditis as well.  DESCRIPTION OF PROCEDURE:  After informed consent was obtained, appropriate left shoulder was  marked.  Anesthesia was obtained, a regional shoulder block in the holding room.  He was then brought to the operating room and placed supine on the operating  table.  General anesthesia was then obtained.  He was then fashioned into beach chair position with appropriate positioning and padding of the head and neck.  There was bending at the waist and the knees and pulses in his feet are palpable.  His left  operative shoulder was prepped and draped with DuraPrep and sterile drapes and placed in line skeletal traction using a push and pull traction device and 10 pounds of traction.  His rotation was neutral and forward flexion was 45 degrees.  Again, there  was appropriate positioning of the head and neck as well.  A timeout was called.  He was identified correct patient, correct left shoulder.  I then entered the glenohumeral joint through the posterolateral aspect of the shoulder.  We found extensive  synovitis throughout the anterior and superior aspect of the shoulder capsule area.  There was degenerative tearing of the labrum.  The biceps tendon and subscapularis was intact and the undersurface of the rotator cuff tear did not show any  full-thickness tear. Through the rotator interval in the front of the shoulder we then placed a soft tissue ablation wand.  I removed significantly inflamed synovitis from within the shoulder joint itself.  We debrided the anterior, posterior and  superior labrum as well as the undersurface of the rotator cuff.  The cartilage on the humeral head and glenoid was intact.  Of note, prior to starting the case, we did put the shoulder through gentle range of motion and it did not require any type of  manipulation.  Surprisingly, his motion was full.  We then entered the subacromial space through the posterior portal and made a lateral portal as well.  There was significant tendinosis of the rotator cuff and bursitis. We performed an extensive  debridement in this area with  removing all bursal tissue and thickened tissue off the rotator cuff itself.  We did not need to perform an acromioplasty and I did not do any type of intervention at the [facility] joint.  Once we thoroughly decompressed the  subacromial space, we removed all instrumentation from the shoulder.  The three portal sites were closed with interrupted nylon suture.  Xeroform well-padded sterile dressing was applied.  He was placed in a sling.  He was awakened, extubated, and taken  to recovery room in stable condition with all final counts being correct.  No complications noted.  Postoperatively, he will be discharged to home with instructions for getting his shoulder moving.  We will see him back in the office in a week.   PUS D: 02/10/2022 11:24:10 am T: 02/10/2022 3:00:00 pm  JOB: [id number]/ [id number]

## 2022-02-10 NOTE — H&P (Signed)
Joshua Cole is an 61 y.o. male.   Chief Complaint: Left shoulder pain and weakness HPI: The patient is a 61 year old gentleman with significant medical issues who with time is developed worsening left shoulder pain and a frozen shoulder.  A MRI was eventually obtained showing a full-thickness rotator cuff tear with no significant retraction and no muscle atrophy.  He has a history of MSSA bacteremia and in the past subacute bacterial endocarditis.  His pain has been quite severe with his left shoulder and has failed all forms conservative treatment.  At this point he has been cleared for surgery but we certainly need to be cautious.  The goal of surgery is to hopefully improve the function of his left shoulder and decrease his pain and improve the shoulder mobility.  Given the failure conservative treatment he does wish to proceed with this type of intervention.  Past Medical History:  Diagnosis Date   Arthritis    Asthma    Diabetes mellitus without complication (HCC)    type 2   DJD (degenerative joint disease)    Dysrhythmia    junctional tachycardia and incomplete heart block   Gout    Lung nodules    a. 09/2021 CT chest: Interval decrease in size and number of bilateral lung nodules, likely consistent with sequelae associated with an infectious/inflammatory process.   MSSA bacteremia 06/2021   Rotator cuff tear 08/26/2021   Subacute bacterial endocarditis (SBE)    a. 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    Past Surgical History:  Procedure Laterality Date   ACHILLES TENDON REPAIR Left    many years ago per pt   BIOPSY  07/16/2021   Procedure: BIOPSY;  Surgeon: DSharyn Creamer MD;  [facility]  Service: Gastroenterology;;  EGD and COLON   COLONOSCOPY N/A 07/16/2021   Procedure: COLONOSCOPY;   Surgeon: DSharyn Creamer MD;  [place] [facility]  Service: Gastroenterology;  Laterality: N/A;   COLONOSCOPY WITH PROPOFOL N/A 07/16/2021   Procedure: COLONOSCOPY WITH PROPOFOL;  Surgeon: DSharyn Creamer MD;  [place]  Service: Gastroenterology;  Laterality: N/A;   ESOPHAGOGASTRODUODENOSCOPY (EGD) WITH PROPOFOL N/A 07/16/2021   Procedure: ESOPHAGOGASTRODUODENOSCOPY (EGD) WITH PROPOFOL;  Surgeon: DSharyn Creamer MD;  [place] [facility]  Service: Gastroenterology;  Laterality: N/A;   FOOT SURGERY Right    ligaments repaired- many years ago per pt   JOINT REPLACEMENT Bilateral    hip   POLYPECTOMY  07/16/2021   Procedure: POLYPECTOMY;  Surgeon: DSharyn Creamer MD;  [place] [facility]  Service: Gastroenterology;;   TEE WITHOUT CARDIOVERSION N/A 07/07/2021   Procedure: TRANSESOPHAGEAL ECHOCARDIOGRAM (TEE);  Surgeon: AKate Sable MD;  Location: [facility];  Service: Cardiovascular;  Laterality: N/A;    Family History  Problem Relation Age of Onset   Other Mother        unknown medical history   Lung cancer Father    Psoriasis Sister    Breast cancer Sister    Hypertension Brother    Hyperlipidemia Brother    Diabetes Brother    Heart attack Brother 551  Social History:  reports that he quit smoking about 16 years ago. His smoking use included cigarettes. He has a 3.75 pack-year smoking history. He has never used smokeless tobacco. He reports current alcohol use of about 6.0 standard drinks per  week. He reports that he does not currently use drugs.  Allergies: No Known Allergies  Medications Prior to Admission  Medication Sig Dispense Refill   acetaminophen (TYLENOL) 500 MG tablet Take 1,000 mg by mouth in the morning, at noon, in the evening, and at bedtime.     albuterol (PROVENTIL) (2.5 MG/3ML) 0.083% nebulizer solution Take 2.5 mg by nebulization every 6 (six) hours as needed for wheezing or shortness of breath.     diclofenac Sodium (VOLTAREN) 1 %  GEL Apply 2 g topically 2 (two) times daily as needed (pain).     ferrous sulfate 325 (65 FE) MG tablet Take 1 tablet (325 mg total) by mouth once daily with breakfast. 30 tablet 0   furosemide (LASIX) 20 MG tablet Take 1 tablet (20 mg total) by mouth once daily as needed (swelling or weight gain). 30 tablet 2   gabapentin (NEURONTIN) 100 MG capsule Take 2 capsules (200 mg total) by mouth 2 (two) times daily. 120 capsule 0   Insulin Glargine (BASAGLAR KWIKPEN) 100 UNIT/ML Inject 15 Units into the skin once daily at bedtime. 15 mL 11   meloxicam (MOBIC) 7.5 MG tablet Take 1 tablet (7.5 mg total) by mouth once daily. 30 tablet 0   polyvinyl alcohol (LIQUIFILM TEARS) 1.4 % ophthalmic solution Place 1 drop into both eyes as needed for dry eyes.     acetaminophen (TYLENOL) 325 MG tablet Take 2 tablets (650 mg total) by mouth every 6 (six) hours as needed for mild pain or fever. (Patient not taking: Reported on 01/28/2022)     glucose blood (RIGHTEST GS550 BLOOD GLUCOSE) test strip USE AS DIRECTED TO CHECK BLOOD SUGAR UP TO 4 TIMES DAILY. 100 strip 0   Insulin Pen Needle 32G X 4 MM MISC USE AS DIRECTED 100 each PRN   loratadine (CLARITIN) 10 MG tablet Take 10 mg by mouth daily as needed for allergies.     Rightest GL300 Lancets MISC USE AS DIRECTED TO CHECK BLOOD SUGAR UP TO 4 TIMES DAILY. (Patient not taking: Reported on 11/24/2021) 100 each 0   urea (URE-NA) 15 g PACK oral packet Take 15 g by mouth once every Tuesday, Thursday, and Saturday at 6 PM. (Patient not taking: Reported on 01/28/2022) 18 packet 0    Results for orders placed or performed during the hospital encounter of 02/10/22 (from the past 48 hour(s))  Glucose, capillary     Status: Abnormal   Collection Time: 02/10/22  8:23 AM  Result Value Ref Range   Glucose-Capillary 159 (H) 70 - 99 mg/dL    Comment: Glucose reference range applies only to samples taken after fasting for at least 8 hours.   No results found.  Review of  Systems  Blood pressure (!) 132/91, pulse 98, temperature 98.1 F (36.7 C), temperature source Oral, resp. rate 16, height '5\' 10"'$  (1.778 m), weight 106.6 kg, SpO2 94 %. Physical Exam Vitals reviewed.  Constitutional:      Appearance: Normal appearance.  Eyes:     Extraocular Movements: Extraocular movements intact.     Pupils: Pupils are equal, round, and reactive to light.  Cardiovascular:     Rate and Rhythm: Normal rate.     Pulses: Normal pulses.  Pulmonary:     Effort: Pulmonary effort is normal.  Abdominal:     Palpations: Abdomen is soft.  Musculoskeletal:     Left shoulder: Tenderness and bony tenderness present. Decreased range of motion. Decreased strength.     Cervical back:  Normal range of motion and neck supple.  Neurological:     Mental Status: He is alert and oriented to person, place, and time.  Psychiatric:        Behavior: Behavior normal.     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  If the rotator cuff is repairable and amenable to repair that will be performed in the same setting as well.  The risk and benefits of surgery been explained in detail and informed consent is obtained.  The left operative shoulder has been marked.  Mcarthur Rossetti, MD 02/10/2022, 9:11 AM

## 2022-02-10 NOTE — Anesthesia Postprocedure Evaluation (Signed)
Anesthesia Post Note  Patient: Joshua Cole  Procedure(s) Performed: LEFT SHOULDER ARTHROSCOPY WITH EXTENSIVE DEBRIDEMENT, SUBACROMIAL DECOMPRESSION (Left: Shoulder)     Patient location during evaluation: PACU Anesthesia Type: Regional and General Level of consciousness: awake Pain management: pain level controlled Vital Signs Assessment: post-procedure vital signs reviewed and stable Respiratory status: spontaneous breathing and respiratory function stable Cardiovascular status: stable Postop Assessment: no apparent nausea or vomiting Anesthetic complications: no   No notable events documented.  Last Vitals:  Vitals:   02/10/22 1155 02/10/22 1210  BP: 125/88 120/79  Pulse: 97 98  Resp: 20 17  Temp:    SpO2: 94% 96%    Last Pain:  Vitals:   02/10/22 1140  TempSrc:   PainSc: 0-No pain                 Merlinda Frederick

## 2022-02-10 NOTE — Transfer of Care (Signed)
Immediate Anesthesia Transfer of Care Note  Patient: Bryten Maher  Procedure(s) Performed: LEFT SHOULDER ARTHROSCOPY WITH EXTENSIVE DEBRIDEMENT, SUBACROMIAL DECOMPRESSION (Left: Shoulder)  Patient Location: PACU  Anesthesia Type:GA combined with regional for post-op pain  Level of Consciousness: awake, patient cooperative and responds to stimulation  Airway & Oxygen Therapy: Patient Spontanous Breathing  Post-op Assessment: Report given to RN and Post -op Vital signs reviewed and stable  Post vital signs: Reviewed and stable  Last Vitals:  Vitals Value Taken Time  BP 128/83 02/10/22 1141  Temp    Pulse 96 02/10/22 1143  Resp 21 02/10/22 1143  SpO2 96 % 02/10/22 1143  Vitals shown include unvalidated device data.  Last Pain:  Vitals:   02/10/22 0832  TempSrc:   PainSc: 3       Patients Stated Pain Goal: 1 (96/22/29 7989)  Complications: No notable events documented.

## 2022-02-10 NOTE — Discharge Instructions (Signed)
Increase her activities as comfort allows. Do expect left shoulder swelling for several days and even bloody drainage from the incisions. Ice intermittently throughout the next several days as needed. Do come in and out of your sling several times a day starting tomorrow to work on shoulder motion. You can actually shower and get your incisions wet on a daily basis starting in 1 to 2 days. After you shower, do place Band-Aids over your incisions daily.

## 2022-02-10 NOTE — Anesthesia Procedure Notes (Signed)
Procedure Name: Intubation Date/Time: 02/10/2022 10:13 AM Performed by: Michele Rockers, CRNA Pre-anesthesia Checklist: Patient identified, Patient being monitored, Timeout performed, Emergency Drugs available and Suction available Patient Re-evaluated:Patient Re-evaluated prior to induction Oxygen Delivery Method: Circle System Utilized Preoxygenation: Pre-oxygenation with 100% oxygen Induction Type: IV induction Ventilation: Mask ventilation without difficulty Laryngoscope Size: Miller and 2 Grade View: Grade I Tube type: Oral Tube size: 7.5 mm Number of attempts: 1 Airway Equipment and Method: Stylet Placement Confirmation: ETT inserted through vocal cords under direct vision, positive ETCO2 and breath sounds checked- equal and bilateral Secured at: 22 cm Tube secured with: Tape Dental Injury: Teeth and Oropharynx as per pre-operative assessment

## 2022-02-10 NOTE — Anesthesia Procedure Notes (Signed)
Anesthesia Regional Block: Interscalene brachial plexus block   Pre-Anesthetic Checklist: , timeout performed,  Correct Patient, Correct Site, Correct Laterality,  Correct Procedure, Correct Position, site marked,  Risks and benefits discussed,  Surgical consent,  Pre-op evaluation,  At surgeon's request and post-op pain management  Laterality: Left  Prep: chloraprep       Needles:  Injection technique: Single-shot  Needle Type: Echogenic Stimulator Needle     Needle Length: 10cm  Needle Gauge: 20     Additional Needles:   Procedures:,,,, ultrasound used (permanent image in chart),,    Narrative:  Start time: 02/10/2022 9:00 AM End time: 02/10/2022 9:05 AM Injection made incrementally with aspirations every 5 mL.  Performed by: Personally  Anesthesiologist: Merlinda Frederick, MD  Additional Notes: Standard monitors applied. Skin prepped. Good needle visualization with ultrasound. Injection made in 5cc increments with no resistance to injection. Patient tolerated the procedure well.

## 2022-02-11 ENCOUNTER — Encounter (HOSPITAL_COMMUNITY): Payer: Self-pay | Admitting: Orthopaedic Surgery

## 2022-02-15 ENCOUNTER — Other Ambulatory Visit: Payer: Self-pay

## 2022-02-17 ENCOUNTER — Encounter: Payer: Self-pay | Admitting: Orthopaedic Surgery

## 2022-02-17 ENCOUNTER — Ambulatory Visit (INDEPENDENT_AMBULATORY_CARE_PROVIDER_SITE_OTHER): Payer: Self-pay | Admitting: Orthopaedic Surgery

## 2022-02-17 DIAGNOSIS — Z9889 Other specified postprocedural states: Secondary | ICD-10-CM

## 2022-02-17 DIAGNOSIS — M25512 Pain in left shoulder: Secondary | ICD-10-CM

## 2022-02-17 DIAGNOSIS — G8929 Other chronic pain: Secondary | ICD-10-CM

## 2022-02-17 NOTE — Progress Notes (Signed)
The patient is following up at 1 week status post a left shoulder arthroscopy with subacromial decompression and extensive debridement.  We found severe synovitis in the shoulder but no rotator cuff tear.  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  I did share with him the arthroscopy pictures showing him the amount of synovitis in his shoulder.  It is essential we get him set up for outpatient physical therapy to work on any modalities that can get his shoulder moving better and decrease his pain.  Incisions look good.  The sutures have been removed.  We will see him back in 4 weeks to see how he is doing overall.  Apparently has a remote history of hip replacements and he needs to have these evaluated.  At his next visit we will have a AP pelvis and lateral both hips.

## 2022-02-17 NOTE — Addendum Note (Signed)
Addended byLaurann Montana on: 02/17/2022 02:31 PM   Modules accepted: Orders

## 2022-02-22 ENCOUNTER — Other Ambulatory Visit: Payer: Self-pay | Admitting: Gerontology

## 2022-02-22 ENCOUNTER — Other Ambulatory Visit: Payer: Self-pay

## 2022-02-22 DIAGNOSIS — D62 Acute posthemorrhagic anemia: Secondary | ICD-10-CM

## 2022-02-22 MED FILL — Meloxicam Tab 15 MG: ORAL | 30 days supply | Qty: 15 | Fill #0 | Status: AC

## 2022-02-23 ENCOUNTER — Other Ambulatory Visit: Payer: Self-pay

## 2022-02-23 MED FILL — Ferrous Sulfate Tab 325 MG (65 MG Elemental Fe): ORAL | 90 days supply | Qty: 90 | Fill #0 | Status: AC

## 2022-02-25 ENCOUNTER — Other Ambulatory Visit: Payer: Self-pay

## 2022-03-01 ENCOUNTER — Other Ambulatory Visit: Payer: Self-pay

## 2022-03-02 ENCOUNTER — Ambulatory Visit (INDEPENDENT_AMBULATORY_CARE_PROVIDER_SITE_OTHER): Payer: Self-pay | Admitting: Cardiology

## 2022-03-02 ENCOUNTER — Encounter: Payer: Self-pay | Admitting: Cardiology

## 2022-03-02 VITALS — BP 122/68 | HR 98 | Ht 70.0 in | Wt 232.0 lb

## 2022-03-02 DIAGNOSIS — I442 Atrioventricular block, complete: Secondary | ICD-10-CM

## 2022-03-02 DIAGNOSIS — I498 Other specified cardiac arrhythmias: Secondary | ICD-10-CM

## 2022-03-02 DIAGNOSIS — I078 Other rheumatic tricuspid valve diseases: Secondary | ICD-10-CM

## 2022-03-02 DIAGNOSIS — I330 Acute and subacute infective endocarditis: Secondary | ICD-10-CM

## 2022-03-02 NOTE — Progress Notes (Signed)
Electrophysiology Office Follow up Visit Note:    Date:  03/02/2022   ID:  Joshua Cole, DOB 1961-01-12, MRN [id number]  PCP:  Pcp, No  [facility] Cardiologist:  Kathlyn Sacramento, MD  [facility] Electrophysiologist:  Vickie Epley, MD    Interval History:    Joshua Cole is a 61 y.o. male who presents for a follow up visit.  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  He had a good escape rhythm and did not require transvenous pacing. He subsequently had improved conduction. He saw Joshua Cole in follow-up on November 24, 2021.  Conservative management was pursued for the tricuspid valve endocarditis.  He completed IV antibiotics and had the PICC line removed.  He has been doing well since I last saw him.  He has recently had a arthroscopic shoulder surgery that went well.  No lightheadedness or dizziness.  No syncope.     Past Medical History:  Diagnosis Date   Arthritis    Asthma    Diabetes mellitus without complication (HCC)    type 2   DJD (degenerative joint disease)    Dysrhythmia    junctional tachycardia and incomplete heart block   Gout    Lung nodules    a. 09/2021 CT chest: Interval decrease in size and number of bilateral lung nodules, likely consistent with sequelae associated with an infectious/inflammatory process.   MSSA bacteremia 06/2021   Rotator cuff tear 08/26/2021   Subacute bacterial endocarditis (SBE)    a. 06/2021 TEE: mobile mass attached to the tricuspid valve-->Abx rx-->09/2021 TEE: EF 55-60%, no rwma, nl RV fxn, mild-mod RAE, mild MR, mobile echodense 11x19m mass in the TV apparatus-->conservative rx per TCTS.    Past Surgical History:  Procedure Laterality Date   ACHILLES TENDON REPAIR Left    many years ago per pt   BIOPSY  07/16/2021   Procedure: BIOPSY;  Surgeon: DSharyn Creamer MD;  [facility]  Service:  Gastroenterology;;  EGD and COLON   COLONOSCOPY N/A 07/16/2021   Procedure: COLONOSCOPY;  Surgeon: DSharyn Creamer MD;  [place]  Service: Gastroenterology;  Laterality: N/A;   COLONOSCOPY WITH PROPOFOL N/A 07/16/2021   Procedure: COLONOSCOPY WITH PROPOFOL;  Surgeon: DSharyn Creamer MD;  [place] [facility]  Service: Gastroenterology;  Laterality: N/A;   ESOPHAGOGASTRODUODENOSCOPY (EGD) WITH PROPOFOL N/A 07/16/2021   Procedure: ESOPHAGOGASTRODUODENOSCOPY (EGD) WITH PROPOFOL;  Surgeon: DSharyn Creamer MD;  [place] [facility]  Service: Gastroenterology;  Laterality: N/A;   FOOT SURGERY Right    ligaments repaired- many years ago per pt   JOINT REPLACEMENT Bilateral    hip   POLYPECTOMY  07/16/2021   Procedure: POLYPECTOMY;  Surgeon: DSharyn Creamer MD;  [facility];  Service: Gastroenterology;;   SHOULDER ARTHROSCOPY WITH ROTATOR CUFF REPAIR AND SUBACROMIAL DECOMPRESSION Left 02/10/2022   Procedure: LEFT SHOULDER ARTHROSCOPY WITH EXTENSIVE DEBRIDEMENT, SUBACROMIAL DECOMPRESSION;  Surgeon: BMcarthur Rossetti MD;  [place]  Service: Orthopedics;  Laterality: Left;   TEE WITHOUT CARDIOVERSION N/A 07/07/2021   Procedure: TRANSESOPHAGEAL ECHOCARDIOGRAM (TEE);  Surgeon: AKate Sable MD;  [facility];  Service: Cardiovascular;  Laterality: N/A;    Current Medications: Current Meds  Medication Sig   acetaminophen (TYLENOL) 500 MG tablet Take 1,000 mg by mouth in the morning, at noon, in the evening, and at bedtime.   albuterol (PROVENTIL) (2.5 MG/3ML) 0.083% nebulizer solution Take  2.5 mg by nebulization every 6 (six) hours as needed for wheezing or shortness of breath.   diclofenac Sodium (VOLTAREN) 1 % GEL Apply 2 g topically 2 (two) times daily as needed (pain).   ferrous sulfate (FEROSUL) 325 (65 FE) MG tablet Take 1 tablet (325 mg total) by mouth once daily with breakfast.   furosemide (LASIX) 20 MG tablet Take 1 tablet (20 mg total)  by mouth once daily as needed (swelling or weight gain).   gabapentin (NEURONTIN) 100 MG capsule Take 2 capsules (200 mg total) by mouth 2 (two) times daily.   glucose blood (RIGHTEST GS550 BLOOD GLUCOSE) test strip USE AS DIRECTED TO CHECK BLOOD SUGAR UP TO 4 TIMES DAILY.   Insulin Glargine (BASAGLAR KWIKPEN) 100 UNIT/ML Inject 15 Units into the skin once daily at bedtime.   Insulin Pen Needle 32G X 4 MM MISC USE AS DIRECTED   loratadine (CLARITIN) 10 MG tablet Take 10 mg by mouth daily as needed for allergies.   meloxicam (MOBIC) 15 MG tablet Take 1/2 tablet (7.5 mg total) by mouth daily.   oxyCODONE (ROXICODONE) 5 MG immediate release tablet Take 1-2 tablets (5-10 mg total) by mouth every 6 (six) hours as needed for severe pain.   polyvinyl alcohol (LIQUIFILM TEARS) 1.4 % ophthalmic solution Place 1 drop into both eyes as needed for dry eyes.   Rightest GL300 Lancets MISC USE AS DIRECTED TO CHECK BLOOD SUGAR UP TO 4 TIMES DAILY.     Allergies:   Patient has no known allergies.   Social History   Socioeconomic History   Marital status: Widowed    Spouse name: Not on file   Number of children: 1   Years of education: Not on file   Highest education level: Not on file  Occupational History   Not on file  Tobacco Use   Smoking status: Former    Packs/day: 0.25    Years: 15.00    Total pack years: 3.75    Types: Cigarettes    Quit date: 2007    Years since quitting: 16.4   Smokeless tobacco: Never  Vaping Use   Vaping Use: Never used  Substance and Sexual Activity   Alcohol use: Yes    Alcohol/week: 6.0 standard drinks of alcohol    Types: 6 Glasses of wine per week    Comment: couple of glasses of wine on the weekends   Drug use: Not Currently   Sexual activity: Not on file  Other Topics Concern   Not on file  Social History Narrative   Not on file   Social Determinants of Health   Financial Resource Strain: Not on file  Food Insecurity: No Food Insecurity  (08/25/2021)   Hunger Vital Sign    Worried About Running Out of Food in the Last Year: Never true    Ran Out of Food in the Last Year: Never true  Transportation Needs: No Transportation Needs (08/25/2021)   PRAPARE - Hydrologist (Medical): No    Lack of Transportation (Non-Medical): No  Physical Activity: Not on file  Stress: Not on file  Social Connections: Not on file     Family History: The patient's family history includes Breast cancer in his sister; Diabetes in his brother; Heart attack (age of onset: 17) in his brother; Hyperlipidemia in his brother; Hypertension in his brother; Lung cancer in his father; Other in his mother; Psoriasis in his sister.  ROS:   Please see the  history of present illness.    All other systems reviewed and are negative.  EKGs/Labs/Other Studies Reviewed:    The following studies were reviewed today:   EKG:  The ekg ordered today demonstrates sinus rhythm with a PR of 280 ms.  QRS duration of 86 ms.  Recent Labs: 07/05/2021: B Natriuretic Peptide 219.6 07/07/2021: TSH 0.162 07/20/2021: Magnesium 2.2 09/20/2021: ALT 2 02/02/2022: BUN 8; Creatinine, Ser 0.67; Hemoglobin 14.2; Platelets PLATELET CLUMPS NOTED ON SMEAR, UNABLE TO ESTIMATE; Potassium 4.2; Sodium 135  Recent Lipid Panel    Component Value Date/Time   CHOL 135 08/25/2021 1042   TRIG 88 08/25/2021 1042   HDL 34 (L) 08/25/2021 1042   CHOLHDL 4.0 08/25/2021 1042   CHOLHDL NOT CALCULATED 07/08/2021 0628   VLDL 20 07/08/2021 0628   LDLCALC 84 08/25/2021 1042    Physical Exam:    VS:  BP 122/68   Pulse 98   Ht '5\' 10"'  (1.778 m)   Wt 232 lb (105.2 kg)   SpO2 96%   BMI 33.29 kg/m     Wt Readings from Last 3 Encounters:  03/02/22 232 lb (105.2 kg)  02/10/22 235 lb (106.6 kg)  02/02/22 238 lb 4.8 oz (108.1 kg)     GEN:  Well nourished, well developed in no acute distress HEENT: Normal NECK: No JVD; No carotid bruits LYMPHATICS: No  lymphadenopathy CARDIAC: RRR, no murmurs, rubs, gallops RESPIRATORY:  Clear to auscultation without rales, wheezing or rhonchi  ABDOMEN: Soft, non-tender, non-distended MUSCULOSKELETAL:  No edema; No deformity  SKIN: Warm and dry NEUROLOGIC:  Alert and oriented x 3 PSYCHIATRIC:  Normal affect        ASSESSMENT:    1. Heart block AV complete ([place])   2. Accelerated junctional rhythm   3. Tricuspid valve mass   4. Acute bacterial endocarditis    PLAN:    In order of problems listed above:  #Complete heart block Resolved after antibiotic therapy for his endocarditis.  Narrow QRS.  Does still have a prolonged PR interval.  Given no syncope or presyncope and repeat EKG showing consistent intact AV conduction, I do not think there is indication for permanent pacing.  I would recommend continued routine monitoring.  #Tricuspid valve endocarditis Doing well after antibiotic therapy completed.  EP follow-up on an as-needed basis.  Recommend continued primary care follow-up.    Medication Adjustments/Labs and Tests Ordered: Current medicines are reviewed at length with the patient today.  Concerns regarding medicines are outlined above.  No orders of the defined types were placed in this encounter.  No orders of the defined types were placed in this encounter.    Signed, Lars Mage, MD, [facility], [facility] 03/02/2022 10:46 AM    Electrophysiology [facility]

## 2022-03-07 ENCOUNTER — Ambulatory Visit: Payer: Medicaid Other | Attending: Cardiology | Admitting: Physical Therapy

## 2022-03-07 ENCOUNTER — Other Ambulatory Visit: Payer: Self-pay

## 2022-03-07 DIAGNOSIS — M25512 Pain in left shoulder: Secondary | ICD-10-CM | POA: Diagnosis not present

## 2022-03-07 DIAGNOSIS — Z9889 Other specified postprocedural states: Secondary | ICD-10-CM | POA: Diagnosis not present

## 2022-03-10 ENCOUNTER — Encounter: Payer: Self-pay | Admitting: Physical Therapy

## 2022-03-10 ENCOUNTER — Other Ambulatory Visit: Payer: Self-pay | Admitting: Gerontology

## 2022-03-10 ENCOUNTER — Ambulatory Visit: Payer: Medicaid Other | Admitting: Physical Therapy

## 2022-03-10 ENCOUNTER — Other Ambulatory Visit: Payer: Self-pay

## 2022-03-10 DIAGNOSIS — Z9889 Other specified postprocedural states: Secondary | ICD-10-CM | POA: Diagnosis not present

## 2022-03-10 DIAGNOSIS — M25512 Pain in left shoulder: Secondary | ICD-10-CM | POA: Diagnosis not present

## 2022-03-10 DIAGNOSIS — G629 Polyneuropathy, unspecified: Secondary | ICD-10-CM

## 2022-03-10 MED ORDER — GABAPENTIN 100 MG PO CAPS
200.0000 mg | ORAL_CAPSULE | Freq: Two times a day (BID) | ORAL | 0 refills | Status: DC
  Filled 2022-03-10: qty 120, 30d supply, fill #0

## 2022-03-10 NOTE — Therapy (Signed)
OUTPATIENT PHYSICAL THERAPY TREATMENT NOTE   Patient Name: Joshua Cole MRN: [id number] DOB:[birth year], 61 y.o., male Today's Date: 03/10/2022  PCP: No PCP   REFERRING PROVIDER: Dr. Quentin Ore   END OF SESSION:   PT End of Session - 03/10/22 1552     Visit Number 2    Number of Visits 20    Date for PT Re-Evaluation 05/16/22    Authorization Type [employer]    Progress Note Due on Visit 10    PT Start Time 1550    PT Stop Time 1630    PT Time Calculation (min) 40 min    Activity Tolerance Patient limited by pain    Behavior During Therapy [facility] for tasks assessed/performed             Past Medical History:  Diagnosis Date   Arthritis    Asthma    Diabetes mellitus without complication ([place])    type 2   DJD (degenerative joint disease)    Dysrhythmia    junctional tachycardia and incomplete heart block   Gout    Lung nodules    a. 09/2021 CT chest: Interval decrease in size and number of bilateral lung nodules, likely consistent with sequelae associated with an infectious/inflammatory process.   MSSA bacteremia 06/2021   Rotator cuff tear 08/26/2021   Subacute bacterial endocarditis (SBE)    a. 06/2021 TEE: mobile mass attached to the tricuspid valve-->Abx rx-->09/2021 TEE: EF 55-60%, no rwma, nl RV fxn, mild-mod RAE, mild MR, mobile echodense 11x52m mass in the TV apparatus-->conservative rx per TCTS.   Past Surgical History:  Procedure Laterality Date   ACHILLES TENDON REPAIR Left    many years ago per pt   BIOPSY  07/16/2021   Procedure: BIOPSY;  Surgeon: DSharyn Creamer MD;  [facility]  Service: Gastroenterology;;  EGD and COLON   COLONOSCOPY N/A 07/16/2021   Procedure: COLONOSCOPY;  Surgeon: DSharyn Creamer MD;  [place] [place]  Service: Gastroenterology;  Laterality: N/A;   COLONOSCOPY WITH PROPOFOL N/A 07/16/2021   Procedure: COLONOSCOPY WITH PROPOFOL;  Surgeon: DSharyn Creamer MD;  [place]   Service: Gastroenterology;  Laterality: N/A;   ESOPHAGOGASTRODUODENOSCOPY (EGD) WITH PROPOFOL N/A 07/16/2021   Procedure: ESOPHAGOGASTRODUODENOSCOPY (EGD) WITH PROPOFOL;  Surgeon: DSharyn Creamer MD;  [place] [facility]  Service: Gastroenterology;  Laterality: N/A;   FOOT SURGERY Right    ligaments repaired- many years ago per pt   JOINT REPLACEMENT Bilateral    hip   POLYPECTOMY  07/16/2021   Procedure: POLYPECTOMY;  Surgeon: DSharyn Creamer MD;  [facility];  Service: Gastroenterology;;   SHOULDER ARTHROSCOPY WITH ROTATOR CUFF REPAIR AND SUBACROMIAL DECOMPRESSION Left 02/10/2022   Procedure: LEFT SHOULDER ARTHROSCOPY WITH EXTENSIVE DEBRIDEMENT, SUBACROMIAL DECOMPRESSION;  Surgeon: BMcarthur Rossetti MD;  [place]  Service: Orthopedics;  Laterality: Left;   TEE WITHOUT CARDIOVERSION N/A 07/07/2021   Procedure: TRANSESOPHAGEAL ECHOCARDIOGRAM (TEE);  Surgeon: AKate Sable MD;  Location: [facility];  Service: Cardiovascular;  Laterality: N/A;   Patient Active Problem List   Diagnosis Date Noted   Complete tear of left rotator cuff 02/10/2022   Headache 11/02/2021   Peripheral neuropathy 10/05/2021   Acute gout 08/26/2021   Rotator cuff tear 08/26/2021   Encounter to establish care 08/25/2021   Type 2 diabetes mellitus without complications ([place] 141/93/7902  History of gout 08/25/2021   Left shoulder pain 08/25/2021   Duodenal ulcer    History of colonic polyps  Acute blood loss anemia    Heart block AV complete (HCC)    Abnormal LFTs    MSSA bacteremia 07/08/2021   Acute bacterial endocarditis    Heart block    Bacteremia    Hyponatremia 07/06/8526   Acute metabolic encephalopathy 78/24/2353   Joint pain 07/03/2021   Generalized weakness 07/03/2021   SIRS (systemic inflammatory response syndrome) ([place]) 07/03/2021   Hypokalemia 07/03/2021   Hyperglycemia 07/03/2021   Thrombocytopenia ([place]) 07/03/2021   Nausea vomiting and diarrhea 07/03/2021    Gout     REFERRING DIAG: Z98.890 (ICD-10-CM) - Status post arthroscopy of left shoulder   THERAPY DIAG:  Acute pain of left shoulder  Rationale for Evaluation and Treatment Rehabilitation  PERTINENT HISTORY: Per note from Dr. [name] on 02/17/22   The patient is following up at 1 week status post a left shoulder arthroscopy with subacromial decompression and extensive debridement.  We found severe synovitis in the shoulder but no rotator cuff tear.  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   I did share with him the arthroscopy pictures showing him the amount of synovitis in his shoulder.  It is essential we get him set up for outpatient physical therapy to work on any modalities that can get his shoulder moving better and decrease his pain.  Incisions look good.  The sutures have been removed.   We will see him back in 4 weeks to see how he is doing overall.  Apparently has a remote history of hip replacements and he needs to have these evaluated.  At his next visit we will have a AP pelvis and lateral both hips.  PRECAUTIONS: None   SUBJECTIVE: Pt reports that his left shoulder feels somewhat better since last session and that he feels exercises have been helping because he is able to put on his deodorant easier.   PAIN:  Are you having pain? Yes: NPRS scale: 5/10 Pain location: Left anterior shoulder Pain description: Achy Aggravating factors: moving shoulder overhead  Relieving factors: nothing really helps          VITALS: BP 156/96  HR 97 SpO2 100    DIAGNOSTIC FINDINGS:  CLINICAL DATA:  Left shoulder pain   EXAM: MRI OF THE LEFT SHOULDER WITHOUT AND WITH CONTRAST   TECHNIQUE: Multiplanar, multisequence MR imaging of the left shoulder was performed before and after the administration of  intravenous contrast.   CONTRAST:  43m GADAVIST GADOBUTROL 1 MMOL/ML IV SOLN   COMPARISON:  Left shoulder radiograph 07/12/2021   FINDINGS: Rotator cuff: There is a full-thickness, near full width tear of the supraspinatus tendon at the footprint (coronal T2 image 12). There is high-grade articular sided tearing of the anterior fibers of the infraspinatus tendon at the footprint (sagittal T2 image 4-6). Mild tendinosis of the cephalad fibers of the subscapularis tendon. The teres minor is intact.   Muscles: Intramuscular edema within the infraspinatus and deltoid muscles. No significant muscle atrophy.   Biceps Long Head: Intact intra and extra-articular long head biceps tendon.   Acromioclavicular Joint: Moderate-severe arthropathy of the acromioclavicular joint. There is distension of the subacromial-subdeltoid bursa with thick peripheral enhancement, short axis measuring up to 0.9 cm. (Coronal T1 post-contrast image 12, sagittal post image 5).   Glenohumeral Joint: Trace glenohumeral joint fluid. Mild chondrosis.   Labrum: Degenerative superior  labral tearing extending anteriorly and posteriorly through the biceps labral anchor.   Bones: No fracture or dislocation. No aggressive osseous lesion. No convincing marrow signal changes to suggest osteomyelitis. There is low T1 signal within the scapula, acromion, clavicle, nonspecific.   Other: No fluid collection or hematoma.   IMPRESSION: Full-thickness, near full width tear of the supraspinatus tendon at the footprint. High-grade articular sided tearing of the anterior fibers of the infraspinatus tendon at the footprint. Mild subscapularis tendinosis of the cephalad fibers. No significant muscle atrophy.   Distension of the subacromial-subdeltoid bursa with a peripheral enhancement, short axis measuring up to 0.9 cm. While this could be related to the patient's rotator cuff tear, given adjacent reactive edema in the  deltoid and clinical concern for infection, bursa aspiration should be considered.   Minimal there is minimal glenohumeral joint fluid and without adjacent marrow signal abnormality to suggest osteomyelitis. Low T1 signal within the scapula, acromion and clavicle is nonspecific, possibly related to the patient's anemia.   Degenerative superior labral tearing anteriorly and posteriorly through the biceps labral anchor.   Moderate-severe AC joint arthropathy.     Electronically Signed   By: Maurine Simmering M.D.   On: 07/14/2021 16:59   PATIENT SURVEYS:  FOTO 50/65   COGNITION:           Overall cognitive status: Within functional limits for tasks assessed                                  SENSATION: WFL   POSTURE: Slight round in shoulders    UPPER EXTREMITY ROM:         Active ROM Right eval Left eval  Shoulder flexion 80 60*  Shoulder extension 60 60  Shoulder abduction 80 60*  Shoulder adduction      Shoulder internal rotation 70 50*  Shoulder external rotation 90 70*  Elbow flexion 150 150  Elbow extension 60 60  Wrist flexion 80 80  Wrist extension 70 70  Wrist ulnar deviation 30 30  Wrist radial deviation 20 20  Wrist pronation      Wrist supination      (Blank rows = not tested)      Passive ROM Right eval Left eval  Shoulder flexion 160 180  Shoulder extension 60 60  Shoulder abduction 180  180  Shoulder adduction      Shoulder internal rotation 70 70  Shoulder external rotation 90 90  Elbow flexion 150 150  Elbow extension 60 60  Wrist flexion 80 80  Wrist extension 70 70  Wrist ulnar deviation 30 30  Wrist radial deviation 20 20  Wrist pronation      Wrist supination      (Blank rows = not tested)              UPPER EXTREMITY MMT:   MMT Right eval Left eval  Shoulder flexion 4 3-  Shoulder extension 5 5  Shoulder abduction 4 3-  Shoulder adduction      Shoulder internal rotation 4 4-  Shoulder external rotation 4 4-  Middle  trapezius 4 4  Lower trapezius 4 3-  Elbow flexion 5 5  Elbow extension 5 5  Wrist flexion 5 5  Wrist extension 5 5  Wrist ulnar deviation      Wrist radial deviation      Wrist pronation      Wrist supination  Grip strength (lbs)      (Blank rows = not tested)   SHOULDER SPECIAL TESTS:            Impingement tests:  Painful Arch + L               SLAP lesions:  NT            Instability tests:  NT            Rotator cuff assessment:  Infraspinatus + L            Biceps assessment: Speed's Test: + L        JOINT MOBILITY TESTING:  Shoulder Right Inferior Glides Grade III-firm end feel  Shoulder Right AP Glides Grade III-IV - firm end feel    PALPATION:  TTP in anterior of left shoulder              TODAY'S TREATMENT:   03/10/22  THEREX   UBE level 1 resistance for 7 min    Impingement tests:              Painful Arch + L               Rotator cuff assessment:  Infraspinatus + L            Biceps assessment: Speed's Test: + L  Seated Scapular Retraction use Blue TB 3 x 10  Seated ER at 0 deg abduction with Yellow TB 3 x 10    Initial  AAROM Shoulder Flexion/Extension 3 x 10  AAROM Shoulder Abduction/Adduction 3 x 10  Upper Trap Stretch 4 x 60 sec      PATIENT EDUCATION: Education details: form and technique for appropriate exercise and explanation of plan of care  Person educated: Patient Education method: Customer service manager and handout  Education comprehension: verbalized understanding, returned demonstration, and verbal cues required     HOME EXERCISE PROGRAM: Access Code: [email or web address] URL: [email or web address] Date: 03/10/2022 Prepared by: Bradly Chris  Exercises - Seated Shoulder Flexion AAROM with Pulley Behind  - 1 x daily - 7 x weekly - 3 sets - 10 reps - Seated Shoulder Abduction AAROM with Pulley Behind  - 1 x daily - 7 x weekly - 3 sets - 10 reps - Seated Upper Trapezius Stretch  - 1 x daily - 7 x weekly - 1 sets  - 3 reps - 60 hold - Shoulder External Rotation and Scapular Retraction with Resistance  - 1 x daily - 3 x weekly - 3 sets - 10 reps - Standing Shoulder Row with Anchored Resistance  - 1 x daily - 3 x weekly - 3 sets - 10 reps   ASSESSMENT:   CLINICAL IMPRESSION: Pt exhibits ongoing shoulder weakness with positive tests for RTC and biceps and SAIS pathology. Despite these positive results, arthroscopy confirmed that his RTC is not torn. Pt unable to flex or abduc left arm past 90 degrees. Progressed parascapular exercises to improve shoulder stability. Pt is highly compliant and motivated and needs minimal VC and TC to accurately complete exercises. He will benefit from skilled PT to increase left shoulder ROM and strength to return to using left shoulder for overhead activities.      OBJECTIVE IMPAIRMENTS decreased ROM, decreased strength, hypomobility, impaired UE functional use, and pain.    ACTIVITY LIMITATIONS carrying, lifting, dressing, and reach over head   PARTICIPATION LIMITATIONS: cleaning, occupation, and yard work   PERSONAL FACTORS Fitness, Sex, and Time since  onset of injury/illness/exacerbation are also affecting patient's functional outcome.    REHAB POTENTIAL: Good   CLINICAL DECISION MAKING: Stable/uncomplicated   EVALUATION COMPLEXITY: Low     GOALS: Goals reviewed with patient? No   SHORT TERM GOALS: Target date: 03/21/2022     Pt will be independent with HEP in order to improve strength and balance in order to decrease fall risk and improve function at home and work. Baseline: NT  Goal status: INITIAL     LONG TERM GOALS: Target date: 05/16/2022     Patient will have improved function and activity level as evidenced by an increase in FOTO score by 10 points or more.  Baseline: 50/65 Goal status: INITIAL   2.  Patient will improve left shoulder ROM to be close to symmetrical to right shoulder to return to performing overhead activities to complete job  related tasks and yard work.  Baseline: Shoulder Flexion R/L 80*/60* , Shoulder Abduction R/L 80*/60*, Shoulder IR R/L 70/50*, Shoulder ER R/L 90/70*  Goal status: INITIAL   3.  Patient will improve shoulder MMT by >=1/2 muscle grade for improved function to return to completing job related tasks and yard work.  Baseline: Shoulder  Flex R/L 4/3-,Shoulder Abd R/L 4/3- , Shoulder IR R/L 4/4-, Shoulder ER R/L 4/4-, Lower Trap R/L 4-/3-, Mid Trap R/L 4/4 Goal status: INITIAL     PLAN: PT FREQUENCY: 1-2x/week   PT DURATION: 10 weeks   PLANNED INTERVENTIONS: Therapeutic exercises, Therapeutic activity, Balance training, Patient/Family education, Joint manipulation, Joint mobilization, Aquatic Therapy, Dry Needling, Electrical stimulation, Spinal manipulation, Spinal mobilization, Cryotherapy, Moist heat, Manual therapy, and Re-evaluation   PLAN FOR NEXT SESSION: Wall slides and walks and progress parascapular exercises      Daneil Dan, PT 03/10/2022, 4:31 PM

## 2022-03-14 ENCOUNTER — Encounter: Payer: Self-pay | Admitting: Physical Therapy

## 2022-03-14 ENCOUNTER — Ambulatory Visit: Payer: Medicaid Other | Admitting: Physical Therapy

## 2022-03-14 ENCOUNTER — Ambulatory Visit: Payer: Medicaid Other | Attending: Cardiology | Admitting: Physical Therapy

## 2022-03-14 DIAGNOSIS — M25512 Pain in left shoulder: Secondary | ICD-10-CM | POA: Insufficient documentation

## 2022-03-14 NOTE — Therapy (Addendum)
OUTPATIENT PHYSICAL THERAPY TREATMENT NOTE   Patient Name: Joshua Cole MRN: [id number] DOB:01-09-1961, 61 y.o., male Today's Date: 03/14/2022  PCP: No PCP   REFERRING PROVIDER: Dr. Quentin Ore   END OF SESSION:   PT End of Session - 03/14/22 0851     Visit Number 3    Number of Visits 20    Date for PT Re-Evaluation 05/16/22    Authorization Type [place] [employer]    Progress Note Due on Visit 10    PT Start Time 0850    PT Stop Time 0930    PT Time Calculation (min) 40 min    Activity Tolerance Patient tolerated treatment well    Behavior During Therapy [facility] for tasks assessed/performed             Past Medical History:  Diagnosis Date   Arthritis    Asthma    Diabetes mellitus without complication ([place])    type 2   DJD (degenerative joint disease)    Dysrhythmia    junctional tachycardia and incomplete heart block   Gout    Lung nodules    a. 09/2021 CT chest: Interval decrease in size and number of bilateral lung nodules, likely consistent with sequelae associated with an infectious/inflammatory process.   MSSA bacteremia 06/2021   Rotator cuff tear 08/26/2021   Subacute bacterial endocarditis (SBE)    a. 06/2021 TEE: mobile mass attached to the tricuspid valve-->Abx rx-->09/2021 TEE: EF 55-60%, no rwma, nl RV fxn, mild-mod RAE, mild MR, mobile echodense 11x26m mass in the TV apparatus-->conservative rx per TCTS.   Past Surgical History:  Procedure Laterality Date   ACHILLES TENDON REPAIR Left    many years ago per pt   BIOPSY  07/16/2021   Procedure: BIOPSY;  Surgeon: DSharyn Creamer MD;  [facility]  Service: Gastroenterology;;  EGD and COLON   COLONOSCOPY N/A 07/16/2021   Procedure: COLONOSCOPY;  Surgeon: DSharyn Creamer MD;  [place]  Service: Gastroenterology;  Laterality: N/A;   COLONOSCOPY WITH PROPOFOL N/A 07/16/2021   Procedure: COLONOSCOPY WITH PROPOFOL;  Surgeon: DSharyn Creamer MD;  [place]   Service: Gastroenterology;  Laterality: N/A;   ESOPHAGOGASTRODUODENOSCOPY (EGD) WITH PROPOFOL N/A 07/16/2021   Procedure: ESOPHAGOGASTRODUODENOSCOPY (EGD) WITH PROPOFOL;  Surgeon: DSharyn Creamer MD;  [place]  Service: Gastroenterology;  Laterality: N/A;   FOOT SURGERY Right    ligaments repaired- many years ago per pt   JOINT REPLACEMENT Bilateral    hip   POLYPECTOMY  07/16/2021   Procedure: POLYPECTOMY;  Surgeon: DSharyn Creamer MD;  [facility];  Service: Gastroenterology;;   SHOULDER ARTHROSCOPY WITH ROTATOR CUFF REPAIR AND SUBACROMIAL DECOMPRESSION Left 02/10/2022   Procedure: LEFT SHOULDER ARTHROSCOPY WITH EXTENSIVE DEBRIDEMENT, SUBACROMIAL DECOMPRESSION;  Surgeon: BMcarthur Rossetti MD;  [place]  Service: Orthopedics;  Laterality: Left;   TEE WITHOUT CARDIOVERSION N/A 07/07/2021   Procedure: TRANSESOPHAGEAL ECHOCARDIOGRAM (TEE);  Surgeon: AKate Sable MD;  Location: [facility];  Service: Cardiovascular;  Laterality: N/A;   Patient Active Problem List   Diagnosis Date Noted   Complete tear of left rotator cuff 02/10/2022   Headache 11/02/2021   Peripheral neuropathy 10/05/2021   Acute gout 08/26/2021   Rotator cuff tear 08/26/2021   Encounter to establish care 08/25/2021   Type 2 diabetes mellitus without complications ([place] 127/51/7001  History of gout 08/25/2021   Left shoulder pain 08/25/2021   Duodenal ulcer    History of colonic polyps  Acute blood loss anemia    Heart block AV complete (HCC)    Abnormal LFTs    MSSA bacteremia 07/08/2021   Acute bacterial endocarditis    Heart block    Bacteremia    Hyponatremia 37/16/9678   Acute metabolic encephalopathy 93/81/0175   Joint pain 07/03/2021   Generalized weakness 07/03/2021   SIRS (systemic inflammatory response syndrome) ([place]) 07/03/2021   Hypokalemia 07/03/2021   Hyperglycemia 07/03/2021   Thrombocytopenia ([place]) 07/03/2021   Nausea vomiting and diarrhea 07/03/2021    Gout     REFERRING DIAG: Z98.890 (ICD-10-CM) - Status post arthroscopy of left shoulder   THERAPY DIAG:  Acute pain of left shoulder  Rationale for Evaluation and Treatment Rehabilitation  PERTINENT HISTORY: Per note from Dr. [name] on 02/17/22   The patient is following up at 1 week status post a left shoulder arthroscopy with subacromial decompression and extensive debridement.  We found severe synovitis in the shoulder but no rotator cuff tear.  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   I did share with him the arthroscopy pictures showing him the amount of synovitis in his shoulder.  It is essential we get him set up for outpatient physical therapy to work on any modalities that can get his shoulder moving better and decrease his pain.  Incisions look good.  The sutures have been removed.   We will see him back in 4 weeks to see how he is doing overall.  Apparently has a remote history of hip replacements and he needs to have these evaluated.  At his next visit awe will have a AP pelvis and lateral both hips.  PRECAUTIONS: None   SUBJECTIVE: Pt states he has reduced shoulder pain for today's session and that he was unable to get to his home exercises over the weekend.   PAIN:  Are you having pain? Yes: NPRS scale: 2/10 Pain location: Left anterior shoulder Pain description: Achy Aggravating factors: moving shoulder overhead  Relieving factors: nothing really helps          VITALS: BP 156/96  HR 97 SpO2 100    DIAGNOSTIC FINDINGS:  CLINICAL DATA:  Left shoulder pain   EXAM: MRI OF THE LEFT SHOULDER WITHOUT AND WITH CONTRAST   TECHNIQUE: Multiplanar, multisequence MR imaging of the left shoulder was performed before and after the administration of intravenous contrast.   CONTRAST:  44m GADAVIST  GADOBUTROL 1 MMOL/ML IV SOLN   COMPARISON:  Left shoulder radiograph 07/12/2021   FINDINGS: Rotator cuff: There is a full-thickness, near full width tear of the supraspinatus tendon at the footprint (coronal T2 image 12). There is high-grade articular sided tearing of the anterior fibers of the infraspinatus tendon at the footprint (sagittal T2 image 4-6). Mild tendinosis of the cephalad fibers of the subscapularis tendon. The teres minor is intact.   Muscles: Intramuscular edema within the infraspinatus and deltoid muscles. No significant muscle atrophy.   Biceps Long Head: Intact intra and extra-articular long head biceps tendon.   Acromioclavicular Joint: Moderate-severe arthropathy of the acromioclavicular joint. There is distension of the subacromial-subdeltoid bursa with thick peripheral enhancement, short axis measuring up to 0.9 cm. (Coronal T1 post-contrast image 12, sagittal post image 5).   Glenohumeral Joint: Trace glenohumeral joint fluid. Mild chondrosis.   Labrum: Degenerative superior labral tearing extending anteriorly and posteriorly  through the biceps labral anchor.   Bones: No fracture or dislocation. No aggressive osseous lesion. No convincing marrow signal changes to suggest osteomyelitis. There is low T1 signal within the scapula, acromion, clavicle, nonspecific.   Other: No fluid collection or hematoma.   IMPRESSION: Full-thickness, near full width tear of the supraspinatus tendon at the footprint. High-grade articular sided tearing of the anterior fibers of the infraspinatus tendon at the footprint. Mild subscapularis tendinosis of the cephalad fibers. No significant muscle atrophy.   Distension of the subacromial-subdeltoid bursa with a peripheral enhancement, short axis measuring up to 0.9 cm. While this could be related to the patient's rotator cuff tear, given adjacent reactive edema in the deltoid and clinical concern for infection,  bursa aspiration should be considered.   Minimal there is minimal glenohumeral joint fluid and without adjacent marrow signal abnormality to suggest osteomyelitis. Low T1 signal within the scapula, acromion and clavicle is nonspecific, possibly related to the patient's anemia.   Degenerative superior labral tearing anteriorly and posteriorly through the biceps labral anchor.   Moderate-severe AC joint arthropathy.     Electronically Signed   By: Maurine Simmering M.D.   On: 07/14/2021 16:59   PATIENT SURVEYS:  FOTO 50/65   COGNITION:           Overall cognitive status: Within functional limits for tasks assessed                                  SENSATION: WFL   POSTURE: Slight round in shoulders    UPPER EXTREMITY ROM:         Active ROM Right eval Left eval  Shoulder flexion 80 60*  Shoulder extension 60 60  Shoulder abduction 80 60*  Shoulder adduction      Shoulder internal rotation 70 50*  Shoulder external rotation 90 70*  Elbow flexion 150 150  Elbow extension 60 60  Wrist flexion 80 80  Wrist extension 70 70  Wrist ulnar deviation 30 30  Wrist radial deviation 20 20  Wrist pronation      Wrist supination      (Blank rows = not tested)      Passive ROM Right eval Left eval  Shoulder flexion 160 180  Shoulder extension 60 60  Shoulder abduction 180  180  Shoulder adduction      Shoulder internal rotation 70 70  Shoulder external rotation 90 90  Elbow flexion 150 150  Elbow extension 60 60  Wrist flexion 80 80  Wrist extension 70 70  Wrist ulnar deviation 30 30  Wrist radial deviation 20 20  Wrist pronation      Wrist supination      (Blank rows = not tested)              UPPER EXTREMITY MMT:   MMT Right eval Left eval  Shoulder flexion 4 3-  Shoulder extension 5 5  Shoulder abduction 4 3-  Shoulder adduction      Shoulder internal rotation 4 4-  Shoulder external rotation 4 4-  Middle trapezius 4 4  Lower trapezius 4 3-  Elbow  flexion 5 5  Elbow extension 5 5  Wrist flexion 5 5  Wrist extension 5 5  Wrist ulnar deviation      Wrist radial deviation      Wrist pronation      Wrist supination      Grip strength (  lbs)      (Blank rows = not tested)   SHOULDER SPECIAL TESTS:            Impingement tests:  Painful Arch + L               SLAP lesions:  NT            Instability tests:  NT            Rotator cuff assessment:  Infraspinatus + L            Biceps assessment: Speed's Test: + L        JOINT MOBILITY TESTING:  Shoulder Right Inferior Glides Grade III-firm end feel  Shoulder Right AP Glides Grade III-IV - firm end feel    PALPATION:  TTP in anterior of left shoulder              TODAY'S TREATMENT:   03/14/22  UBE Level 2 for 5 min (2.5 forward and 2.5 backward) Shoulder Pulleys AAROM Flexion/Extension 3 x 10  Shoulder Pulleys AAROM Abduction/Adduction 3 x 10  Shoulder AROM Flexon and Extension in standing 3 x 10  Shoulder AROM Abduction and Adduction in standing 3 x 10  Shoulder AROM Flexon and Extension in sitting 3 x 10  Shoulder AROM Abduction and Adduction in sitting 3 x 10  Lat Stretch on left side 3 x 30 sec             Wall Push Up with Plus 3 x 10  Scapular Rows with Black TB 2 x 10   03/10/22  THEREX   UBE level 1 resistance for 7 min    Impingement tests:              Painful Arch + L               Rotator cuff assessment:  Infraspinatus + L            Biceps assessment: Speed's Test: + L  Seated Scapular Retraction use Blue TB 3 x 10  Seated ER at 0 deg abduction with Yellow TB 3 x 10    Initial  AAROM Shoulder Flexion/Extension 3 x 10  AAROM Shoulder Abduction/Adduction 3 x 10  Upper Trap Stretch 4 x 60 sec      PATIENT EDUCATION: Education details: form and technique for appropriate exercise and explanation of plan of care  Person educated: Patient Education method: Customer service manager and handout  Education comprehension: verbalized understanding,  returned demonstration, and verbal cues required     HOME EXERCISE PROGRAM: Access Code: [email or web address] URL: [email or web address] Date: 03/14/2022 Prepared by: Bradly Chris  Exercises - Seated Shoulder Abduction Towel Slide at Table Top with Forearm in Neutral  - 1 x daily - 7 x weekly - 3 sets - 10 reps - Seated Shoulder Flexion Towel Slide at Table Top  - 1 x daily - 7 x weekly - 3 sets - 10 reps - Seated Shoulder Flexion AAROM with Pulley Behind  - 1 x daily - 7 x weekly - 3 sets - 10 reps - Seated Shoulder Abduction AAROM with Pulley Behind  - 1 x daily - 7 x weekly - 3 sets - 10 reps - Wall Push Up with Plus  - 1 x daily - 3 x weekly - 3 sets - 10 reps - Seated Upper Trapezius Stretch  - 1 x daily - 7 x weekly - 1 sets - 3 reps - 60 hold -  Shoulder External Rotation and Scapular Retraction with Resistance  - 1 x daily - 3 x weekly - 3 sets - 10 reps - Standing Shoulder Row with Anchored Resistance  - 1 x daily - 3 x weekly - 3 sets - 10 reps - Latissimus Dorsi Stretch at Wall  - 1 x daily - 7 x weekly - 1 sets - 3 reps - 30 hold   ASSESSMENT:   CLINICAL IMPRESSION: Pt demonstrates an improvement in left shoulder function with increased AROM with ability to perform AROM exercises in a gravity eliminated position. He also exhibits improved parascapular strength with ability to perform scapular rows with increased resistance. He will benefit from skilled PT to increase left shoulder ROM and strength to return to using left shoulder for overhead activities.    OBJECTIVE IMPAIRMENTS decreased ROM, decreased strength, hypomobility, impaired UE functional use, and pain.    ACTIVITY LIMITATIONS carrying, lifting, dressing, and reach over head   PARTICIPATION LIMITATIONS: cleaning, occupation, and yard work   PERSONAL FACTORS Fitness, Sex, and Time since onset of injury/illness/exacerbation are also affecting patient's functional outcome.    REHAB POTENTIAL: Good   CLINICAL  DECISION MAKING: Stable/uncomplicated   EVALUATION COMPLEXITY: Low     GOALS: Goals reviewed with patient? No   SHORT TERM GOALS: Target date: 03/21/2022     Pt will be independent with HEP in order to improve strength and balance in order to decrease fall risk and improve function at home and work. Baseline: NT  Goal status: INITIAL     LONG TERM GOALS: Target date: 05/16/2022     Patient will have improved function and activity level as evidenced by an increase in FOTO score by 10 points or more.  Baseline: 50/65 Goal status: INITIAL   2.  Patient will improve left shoulder ROM to be close to symmetrical to right shoulder to return to performing overhead activities to complete job related tasks and yard work.  Baseline: Shoulder Flexion R/L 80*/60* , Shoulder Abduction R/L 80*/60*, Shoulder IR R/L 70/50*, Shoulder ER R/L 90/70*  Goal status: INITIAL   3.  Patient will improve shoulder MMT by >=1/2 muscle grade for improved function to return to completing job related tasks and yard work.  Baseline: Shoulder  Flex R/L 4/3-,Shoulder Abd R/L 4/3- , Shoulder IR R/L 4/4-, Shoulder ER R/L 4/4-, Lower Trap R/L 4-/3-, Mid Trap R/L 4/4 Goal status: INITIAL     PLAN: PT FREQUENCY: 1-2x/week   PT DURATION: 10 weeks   PLANNED INTERVENTIONS: Therapeutic exercises, Therapeutic activity, Balance training, Patient/Family education, Joint manipulation, Joint mobilization, Aquatic Therapy, Dry Needling, Electrical stimulation, Spinal manipulation, Spinal mobilization, Cryotherapy, Moist heat, Manual therapy, and Re-evaluation   PLAN FOR NEXT SESSION: Continue to progress shoulder AROM and parascapular strengthening exercises      Bradly Chris PT, DPT  03/14/2022, 10:35 AM

## 2022-03-14 NOTE — Therapy (Signed)
Encounter opened in error [facility] [address] [place], [address] Phone: [phone]   Fax:  [phone]  Patient Details  Name: Joshua Cole MRN: [id number] Date of Birth: 13-Jul-1961 Referring Provider:  Vickie Epley, MD  Encounter Date: 03/14/2022   Daneil Dan, PT 03/14/2022, 10:38 AM  [facility] [address] [place], [address] Phone: [phone]   Fax:  [phone]

## 2022-03-15 NOTE — Therapy (Signed)
Note entered in mistake

## 2022-03-16 ENCOUNTER — Ambulatory Visit (INDEPENDENT_AMBULATORY_CARE_PROVIDER_SITE_OTHER): Payer: Self-pay | Admitting: Physician Assistant

## 2022-03-16 ENCOUNTER — Encounter: Payer: Self-pay | Admitting: Physician Assistant

## 2022-03-16 ENCOUNTER — Encounter: Payer: Self-pay | Admitting: Physical Therapy

## 2022-03-16 ENCOUNTER — Ambulatory Visit (INDEPENDENT_AMBULATORY_CARE_PROVIDER_SITE_OTHER): Payer: Self-pay

## 2022-03-16 DIAGNOSIS — M25552 Pain in left hip: Secondary | ICD-10-CM

## 2022-03-16 DIAGNOSIS — Z96643 Presence of artificial hip joint, bilateral: Secondary | ICD-10-CM

## 2022-03-16 DIAGNOSIS — Z96642 Presence of left artificial hip joint: Secondary | ICD-10-CM

## 2022-03-16 NOTE — Progress Notes (Addendum)
Office Visit Note   Patient: Joshua Cole           Date of Birth: Nov 29, 1960           MRN: [id number] Visit Date: 03/16/2022              Requested by: No referring provider defined for this encounter. PCP: Pcp, No   Assessment & Plan: Visit Diagnoses:  1. History of bilateral hip replacements   2. Pain of left hip     Plan: We will obtain sed rate, CRP and CBC on it today.  We will also order a bone scan with SPECT CT scan to rule out loosening of the left hip prosthesis.  Have him back once the bone scan results are available.  Questions encouraged and answered at length.  Follow-Up Instructions: Return for Follow up after Bone scan.   Orders:  Orders Placed This Encounter  Procedures   XR HIPS BILAT W OR W/O PELVIS 3-4 VIEWS   Sed Rate (ESR)   C-reactive protein   CBC   No orders of the defined types were placed in this encounter.     Procedures: No procedures performed   Clinical Data: No additional findings.   Subjective: Chief Complaint  Patient presents with   Left Shoulder - Routine Post Op    HPI [name] returns today for follow-up of his left shoulder status post shoulder arthroscopy 02/10/2022.  States overall the shoulder is doing better with therapy.  His main complaint today is left hip groin pain.  Has a history of bilateral hip replacements some 20 years ago.  He states that since he had MSSA bacteremia last fall he has had pain in his left groin area.  Denies any radicular symptoms down either leg.  He ranks his pain to be 10 out of 10 pain at worst.  He does have neuropathy in both legs due to his diabetes.  July 05, 2021 patient did undergo an MRI of his pelvis which showed moderate right and trace left joint effusion with right sided periprosthetic fluid collection tracking posteriorly along the greater trochanter.  No marrow edema changes.   Review of Systems  Constitutional:  Negative for chills and fever.  Musculoskeletal:   Positive for arthralgias.     Objective: Vital Signs: There were no vitals taken for this visit.  Physical Exam Constitutional:      Appearance: He is not ill-appearing or diaphoretic.  Pulmonary:     Effort: Pulmonary effort is normal.  Neurological:     Mental Status: He is alert and oriented to person, place, and time.  Psychiatric:        Mood and Affect: Mood normal.     Ortho Exam Bilateral hips: Right hip excellent range of motion without pain.  Has fluid motion of the left hip with external and internal rotation but extreme pain.  Nontender over the trochanteric region bilateral hips.  Negative straight leg raise bilaterally. Specialty Comments:  No specialty comments available.  Imaging: XR HIPS BILAT W OR W/O PELVIS 3-4 VIEWS  Result Date: 03/16/2022 AP pelvis lateral view of both hips: Bilateral hips are well located.  Pedicles present at the femoral stem tips.  No acute fractures.  Arthroplasty components appear well-seated bilaterally.    PMFS History: Patient Active Problem List   Diagnosis Date Noted   Complete tear of left rotator cuff 02/10/2022   Headache 11/02/2021   Peripheral neuropathy 10/05/2021   Acute gout 08/26/2021  Rotator cuff tear 08/26/2021   Encounter to establish care 08/25/2021   Type 2 diabetes mellitus without complications ([place]) 47/65/4650   History of gout 08/25/2021   Left shoulder pain 08/25/2021   Duodenal ulcer    History of colonic polyps    Acute blood loss anemia    Heart block AV complete (HCC)    Abnormal LFTs    MSSA bacteremia 07/08/2021   Acute bacterial endocarditis    Heart block    Bacteremia    Hyponatremia 35/46/5681   Acute metabolic encephalopathy 27/51/7001   Joint pain 07/03/2021   Generalized weakness 07/03/2021   SIRS (systemic inflammatory response syndrome) ([place]) 07/03/2021   Hypokalemia 07/03/2021   Hyperglycemia 07/03/2021   Thrombocytopenia ([place]) 07/03/2021   Nausea vomiting and diarrhea  07/03/2021   Gout    Past Medical History:  Diagnosis Date   Arthritis    Asthma    Diabetes mellitus without complication (HCC)    type 2   DJD (degenerative joint disease)    Dysrhythmia    junctional tachycardia and incomplete heart block   Gout    Lung nodules    a. 09/2021 CT chest: Interval decrease in size and number of bilateral lung nodules, likely consistent with sequelae associated with an infectious/inflammatory process.   MSSA bacteremia 06/2021   Rotator cuff tear 08/26/2021   Subacute bacterial endocarditis (SBE)    a. 06/2021 TEE: mobile mass attached to the tricuspid valve-->Abx rx-->09/2021 TEE: EF 55-60%, no rwma, nl RV fxn, mild-mod RAE, mild MR, mobile echodense 11x78m mass in the TV apparatus-->conservative rx per TCTS.    Family History  Problem Relation Age of Onset   Other Mother        unknown medical history   Lung cancer Father    Psoriasis Sister    Breast cancer Sister    Hypertension Brother    Hyperlipidemia Brother    Diabetes Brother    Heart attack Brother 565   Past Surgical History:  Procedure Laterality Date   ACHILLES TENDON REPAIR Left    many years ago per pt   BIOPSY  07/16/2021   Procedure: BIOPSY;  Surgeon: DSharyn Creamer MD;  [facility];  Service: Gastroenterology;;  EGD and COLON   COLONOSCOPY N/A 07/16/2021   Procedure: COLONOSCOPY;  Surgeon: DSharyn Creamer MD;  [place]  Service: Gastroenterology;  Laterality: N/A;   COLONOSCOPY WITH PROPOFOL N/A 07/16/2021   Procedure: COLONOSCOPY WITH PROPOFOL;  Surgeon: DSharyn Creamer MD;  [place] [facility]  Service: Gastroenterology;  Laterality: N/A;   ESOPHAGOGASTRODUODENOSCOPY (EGD) WITH PROPOFOL N/A 07/16/2021   Procedure: ESOPHAGOGASTRODUODENOSCOPY (EGD) WITH PROPOFOL;  Surgeon: DSharyn Creamer MD;  [place]  Service: Gastroenterology;  Laterality: N/A;   FOOT SURGERY Right    ligaments repaired- many years ago per pt   JOINT  REPLACEMENT Bilateral    hip   POLYPECTOMY  07/16/2021   Procedure: POLYPECTOMY;  Surgeon: DSharyn Creamer MD;  [facility];  Service: Gastroenterology;;   SHOULDER ARTHROSCOPY WITH ROTATOR CUFF REPAIR AND SUBACROMIAL DECOMPRESSION Left 02/10/2022   Procedure: LEFT SHOULDER ARTHROSCOPY WITH EXTENSIVE DEBRIDEMENT, SUBACROMIAL DECOMPRESSION;  Surgeon: BMcarthur Rossetti MD;  [place]  Service: Orthopedics;  Laterality: Left;   TEE WITHOUT CARDIOVERSION N/A 07/07/2021   Procedure: TRANSESOPHAGEAL ECHOCARDIOGRAM (TEE);  Surgeon: AKate Sable MD;  Location[facility];  Service: Cardiovascular;  Laterality: N/A;   Social History   Occupational History   Not on file  Tobacco Use   Smoking status: Former    Packs/day: 0.25    Years: 15.00    Total pack years: 3.75    Types: Cigarettes    Quit date: 2007    Years since quitting: 16.5   Smokeless tobacco: Never  Vaping Use   Vaping Use: Never used  Substance and Sexual Activity   Alcohol use: Yes    Alcohol/week: 6.0 standard drinks of alcohol    Types: 6 Glasses of wine per week    Comment: couple of glasses of wine on the weekends   Drug use: Not Currently   Sexual activity: Not on file

## 2022-03-16 NOTE — Addendum Note (Signed)
Addended by: Robyne Peers on: 03/16/2022 02:44 PM   Modules accepted: Orders

## 2022-03-17 ENCOUNTER — Ambulatory Visit: Payer: Medicaid Other | Admitting: Physical Therapy

## 2022-03-17 DIAGNOSIS — M25512 Pain in left shoulder: Secondary | ICD-10-CM

## 2022-03-17 LAB — CBC
HCT: 41 % (ref 38.5–50.0)
Hemoglobin: 13.9 g/dL (ref 13.2–17.1)
MCH: 32.7 pg (ref 27.0–33.0)
MCHC: 33.9 g/dL (ref 32.0–36.0)
MCV: 96.5 fL (ref 80.0–100.0)
RBC: 4.25 10*6/uL (ref 4.20–5.80)
RDW: 12.6 % (ref 11.0–15.0)
WBC: 9.1 10*3/uL (ref 3.8–10.8)

## 2022-03-17 LAB — C-REACTIVE PROTEIN: CRP: 33.3 mg/L — ABNORMAL HIGH (ref <8.0)

## 2022-03-17 LAB — SEDIMENTATION RATE: Sed Rate: 109 mm/h — ABNORMAL HIGH (ref 0–20)

## 2022-03-17 NOTE — Therapy (Signed)
OUTPATIENT PHYSICAL THERAPY TREATMENT NOTE   Patient Name: Joshua Cole MRN: [id number] DOB:June 21, 1961, 61 y.o., male Today's Date: 03/17/2022  PCP: No PCP   REFERRING PROVIDER: Dr. Quentin Ore   END OF SESSION:   PT End of Session - 03/17/22 1646     Visit Number 4    Number of Visits 20    Date for PT Re-Evaluation 05/16/22    Authorization Type [employer]    Progress Note Due on Visit 10    PT Start Time 1635    PT Stop Time 1715    PT Time Calculation (min) 40 min    Activity Tolerance Patient tolerated treatment well    Behavior During Therapy WFL for tasks assessed/performed              Past Medical History:  Diagnosis Date   Arthritis    Asthma    Diabetes mellitus without complication ([place])    type 2   DJD (degenerative joint disease)    Dysrhythmia    junctional tachycardia and incomplete heart block   Gout    Lung nodules    a. 09/2021 CT chest: Interval decrease in size and number of bilateral lung nodules, likely consistent with sequelae associated with an infectious/inflammatory process.   MSSA bacteremia 06/2021   Rotator cuff tear 08/26/2021   Subacute bacterial endocarditis (SBE)    a. 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   Past Surgical History:  Procedure Laterality Date   ACHILLES TENDON REPAIR Left    many years ago per pt   BIOPSY  07/16/2021   Procedure: BIOPSY;  Surgeon: DSharyn Creamer MD;  [facility]  Service: Gastroenterology;;  EGD and COLON   COLONOSCOPY N/A 07/16/2021   Procedure: COLONOSCOPY;  Surgeon: DSharyn Creamer MD;  [facility] [facility]  Service: Gastroenterology;  Laterality: N/A;   COLONOSCOPY WITH PROPOFOL N/A 07/16/2021   Procedure: COLONOSCOPY WITH PROPOFOL;  Surgeon: DSharyn Creamer MD;  [place]  Service: Gastroenterology;  Laterality: N/A;   ESOPHAGOGASTRODUODENOSCOPY (EGD) WITH PROPOFOL N/A 07/16/2021   Procedure: ESOPHAGOGASTRODUODENOSCOPY (EGD) WITH PROPOFOL;  Surgeon: DSharyn Creamer MD;  [place] [facility]  Service: Gastroenterology;  Laterality: N/A;   FOOT SURGERY Right    ligaments repaired- many years ago per pt   JOINT REPLACEMENT Bilateral    hip   POLYPECTOMY  07/16/2021   Procedure: POLYPECTOMY;  Surgeon: DSharyn Creamer MD;  [facility];  Service: Gastroenterology;;   SHOULDER ARTHROSCOPY WITH ROTATOR CUFF REPAIR AND SUBACROMIAL DECOMPRESSION Left 02/10/2022   Procedure: LEFT SHOULDER ARTHROSCOPY WITH EXTENSIVE DEBRIDEMENT, SUBACROMIAL DECOMPRESSION;  Surgeon: BMcarthur Rossetti MD;  [place]  Service: Orthopedics;  Laterality: Left;   TEE WITHOUT CARDIOVERSION N/A 07/07/2021   Procedure: TRANSESOPHAGEAL ECHOCARDIOGRAM (TEE);  Surgeon: AKate Sable MD;  Location: [facility];  Service: Cardiovascular;  Laterality: N/A;   Patient Active Problem List   Diagnosis Date Noted   Complete tear of left rotator cuff 02/10/2022   Headache 11/02/2021   Peripheral neuropathy 10/05/2021   Acute gout 08/26/2021   Rotator cuff tear 08/26/2021   Encounter to establish care 08/25/2021   [place] 2 diabetes mellitus without complications [place] 187/56/4332  History of gout 08/25/2021   Left shoulder pain 08/25/2021   Duodenal ulcer    History of colonic  polyps    Acute blood loss anemia    Heart block AV complete ([facility])    Abnormal LFTs    MSSA bacteremia 07/08/2021   Acute bacterial endocarditis    Heart block    Bacteremia    Hyponatremia 00/92/3300   Acute metabolic encephalopathy 76/22/6333   Joint pain 07/03/2021   Generalized weakness 07/03/2021   SIRS (systemic inflammatory response syndrome) ([place]) 07/03/2021   Hypokalemia 07/03/2021   Hyperglycemia 07/03/2021   Thrombocytopenia ([place]) 07/03/2021   Nausea vomiting and diarrhea  07/03/2021   Gout     REFERRING DIAG: Z98.890 (ICD-10-CM) - Status post arthroscopy of left shoulder   THERAPY DIAG:  Acute pain of left shoulder  Rationale for Evaluation and Treatment Rehabilitation  PERTINENT HISTORY: Per note from Dr. [name] on 02/17/22   The patient is following up at 1 week status post a left shoulder arthroscopy with subacromial decompression and extensive debridement.  We found severe synovitis in the shoulder but no rotator cuff tear.  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   I did share with him the arthroscopy pictures showing him the amount of synovitis in his shoulder.  It is essential we get him set up for outpatient physical therapy to work on any modalities that can get his shoulder moving better and decrease his pain.  Incisions look good.  The sutures have been removed.   We will see him back in 4 weeks to see how he is doing overall.  Apparently has a remote history of hip replacements and he needs to have these evaluated.  At his next visit awe will have a AP pelvis and lateral both hips.  PRECAUTIONS: None   SUBJECTIVE: Pt reports increased pain after doctor examined it yesterday.   PAIN:  Are you having pain? Yes: NPRS scale: 5/10 Pain location: Left anterior shoulder Pain description: Achy Aggravating factors: moving shoulder overhead  Relieving factors: nothing really helps          VITALS: BP 156/96  HR 97 SpO2 100    DIAGNOSTIC FINDINGS:  CLINICAL DATA:  Left shoulder pain   EXAM: MRI OF THE LEFT SHOULDER WITHOUT AND WITH CONTRAST   TECHNIQUE: Multiplanar, multisequence MR imaging of the left shoulder was performed before and after the administration of intravenous contrast.   CONTRAST:  62m GADAVIST GADOBUTROL 1 MMOL/ML IV SOLN   COMPARISON:  Left shoulder  radiograph 07/12/2021   FINDINGS: Rotator cuff: There is a full-thickness, near full width tear of the supraspinatus tendon at the footprint (coronal T2 image 12). There is high-grade articular sided tearing of the anterior fibers of the infraspinatus tendon at the footprint (sagittal T2 image 4-6). Mild tendinosis of the cephalad fibers of the subscapularis tendon. The teres minor is intact.   Muscles: Intramuscular edema within the infraspinatus and deltoid muscles. No significant muscle atrophy.   Biceps Long Head: Intact intra and extra-articular long head biceps tendon.   Acromioclavicular Joint: Moderate-severe arthropathy of the acromioclavicular joint. There is distension of the subacromial-subdeltoid bursa with thick peripheral enhancement, short axis measuring up to 0.9 cm. (Coronal T1 post-contrast image 12, sagittal post image 5).   Glenohumeral Joint: Trace glenohumeral joint fluid. Mild chondrosis.   Labrum: Degenerative superior labral tearing extending anteriorly and posteriorly through the biceps labral anchor.   Bones: No fracture or  dislocation. No aggressive osseous lesion. No convincing marrow signal changes to suggest osteomyelitis. There is low T1 signal within the scapula, acromion, clavicle, nonspecific.   Other: No fluid collection or hematoma.   IMPRESSION: Full-thickness, near full width tear of the supraspinatus tendon at the footprint. High-grade articular sided tearing of the anterior fibers of the infraspinatus tendon at the footprint. Mild subscapularis tendinosis of the cephalad fibers. No significant muscle atrophy.   Distension of the subacromial-subdeltoid bursa with a peripheral enhancement, short axis measuring up to 0.9 cm. While this could be related to the patient's rotator cuff tear, given adjacent reactive edema in the deltoid and clinical concern for infection, bursa aspiration should be considered.   Minimal there is minimal  glenohumeral joint fluid and without adjacent marrow signal abnormality to suggest osteomyelitis. Low T1 signal within the scapula, acromion and clavicle is nonspecific, possibly related to the patient's anemia.   Degenerative superior labral tearing anteriorly and posteriorly through the biceps labral anchor.   Moderate-severe AC joint arthropathy.     Electronically Signed   By: Maurine Simmering M.D.   On: 07/14/2021 16:59   PATIENT SURVEYS:  FOTO 50/65   COGNITION:           Overall cognitive status: Within functional limits for tasks assessed                                  SENSATION: WFL   POSTURE: Slight round in shoulders    UPPER EXTREMITY ROM:         Active ROM Right eval Left eval  Shoulder flexion 80 60*  Shoulder extension 60 60  Shoulder abduction 80 60*  Shoulder adduction      Shoulder internal rotation 70 50*  Shoulder external rotation 90 70*  Elbow flexion 150 150  Elbow extension 60 60  Wrist flexion 80 80  Wrist extension 70 70  Wrist ulnar deviation 30 30  Wrist radial deviation 20 20  Wrist pronation      Wrist supination      (Blank rows = not tested)      Passive ROM Right eval Left eval  Shoulder flexion 160 180  Shoulder extension 60 60  Shoulder abduction 180  180  Shoulder adduction      Shoulder internal rotation 70 70  Shoulder external rotation 90 90  Elbow flexion 150 150  Elbow extension 60 60  Wrist flexion 80 80  Wrist extension 70 70  Wrist ulnar deviation 30 30  Wrist radial deviation 20 20  Wrist pronation      Wrist supination      (Blank rows = not tested)              UPPER EXTREMITY MMT:   MMT Right eval Left eval  Shoulder flexion 4 3-  Shoulder extension 5 5  Shoulder abduction 4 3-  Shoulder adduction      Shoulder internal rotation 4 4-  Shoulder external rotation 4 4-  Middle trapezius 4 4  Lower trapezius 4 3-  Elbow flexion 5 5  Elbow extension 5 5  Wrist flexion 5 5  Wrist  extension 5 5  Wrist ulnar deviation      Wrist radial deviation      Wrist pronation      Wrist supination      Grip strength (lbs)      (Blank rows = not tested)  SHOULDER SPECIAL TESTS:            Impingement tests:  Painful Arch + L               SLAP lesions:  NT            Instability tests:  NT            Rotator cuff assessment:  Infraspinatus + L            Biceps assessment: Speed's Test: + L        JOINT MOBILITY TESTING:  Shoulder Right Inferior Glides Grade III-firm end feel  Shoulder Right AP Glides Grade III-IV - firm end feel    PALPATION:  TTP in anterior of left shoulder              TODAY'S TREATMENT:   03/17/22 UBE level 9 for 5 min (2.5 forward and 2.5 backward) OMEGA Seated Scapular Retraction #25 3 x 10  OMEGA Chest Press #10 3 x 10  OMEGA Lat Press #15 2 x 10  OMEGA Lat Press #20 1 x 10 Red Physioball AROM Flexion and Extension on table 3 x 10  Red Physioball AROM Abduction and Adduction on table 3 x 10    03/14/22  UBE Level 2 for 5 min (2.5 forward and 2.5 backward) Shoulder Pulleys AAROM Flexion/Extension 3 x 10  Shoulder Pulleys AAROM Abduction/Adduction 3 x 10  Shoulder AROM Flexon and Extension in standing 3 x 10  Shoulder AROM Abduction and Adduction in standing 3 x 10  Shoulder AROM Flexon and Extension in sitting 3 x 10  Shoulder AROM Abduction and Adduction in sitting 3 x 10  Lat Stretch on left side 3 x 30 sec             Wall Push Up with Plus 3 x 10  Scapular Rows with Black TB 2 x 10   03/10/22  THEREX   UBE level 1 resistance for 7 min    Impingement tests:              Painful Arch + L               Rotator cuff assessment:  Infraspinatus + L            Biceps assessment: Speed's Test: + L  Seated Scapular Retraction use Blue TB 3 x 10  Seated ER at 0 deg abduction with Yellow TB 3 x 10    Initial  AAROM Shoulder Flexion/Extension 3 x 10  AAROM Shoulder Abduction/Adduction 3 x 10  Upper Trap Stretch 4 x 60 sec       PATIENT EDUCATION: Education details: form and technique for appropriate exercise and explanation of plan of care  Person educated: Patient Education method: Customer service manager and handout  Education comprehension: verbalized understanding, returned demonstration, and verbal cues required     HOME EXERCISE PROGRAM: Access Code: [email or web address] URL: [email or web address] Date: 03/14/2022 Prepared by: Bradly Chris  Exercises - Seated Shoulder Abduction Towel Slide at Table Top with Forearm in Neutral  - 1 x daily - 7 x weekly - 3 sets - 10 reps - Seated Shoulder Flexion Towel Slide at Table Top  - 1 x daily - 7 x weekly - 3 sets - 10 reps - Seated Shoulder Flexion AAROM with Pulley Behind  - 1 x daily - 7 x weekly - 3 sets - 10 reps - Seated Shoulder Abduction AAROM with Pulley Behind  -  1 x daily - 7 x weekly - 3 sets - 10 reps - Wall Push Up with Plus  - 1 x daily - 3 x weekly - 3 sets - 10 reps - Seated Upper Trapezius Stretch  - 1 x daily - 7 x weekly - 1 sets - 3 reps - 60 hold - Shoulder External Rotation and Scapular Retraction with Resistance  - 1 x daily - 3 x weekly - 3 sets - 10 reps - Standing Shoulder Row with Anchored Resistance  - 1 x daily - 3 x weekly - 3 sets - 10 reps - Latissimus Dorsi Stretch at Wall  - 1 x daily - 7 x weekly - 1 sets - 3 reps - 30 hold   ASSESSMENT:   CLINICAL IMPRESSION:  Pt exhibits an improvement in left shoulder AROM with increased AROM with ball sides >90 deg for flexion and abduction. He also shows improved parascapular strength with ability to perform exercises with increased weight for rows. He will continue to benefit from skilled PT to increase left shoulder ROM and strength to return to using left shoulder for overhead activities.     OBJECTIVE IMPAIRMENTS decreased ROM, decreased strength, hypomobility, impaired UE functional use, and pain.    ACTIVITY LIMITATIONS carrying, lifting, dressing, and reach over  head   PARTICIPATION LIMITATIONS: cleaning, occupation, and yard work   PERSONAL FACTORS Fitness, Sex, and Time since onset of injury/illness/exacerbation are also affecting patient's functional outcome.    REHAB POTENTIAL: Good   CLINICAL DECISION MAKING: Stable/uncomplicated   EVALUATION COMPLEXITY: Low     GOALS: Goals reviewed with patient? No   SHORT TERM GOALS: Target date: 03/21/2022     Pt will be independent with HEP in order to improve strength and balance in order to decrease fall risk and improve function at home and work. Baseline: NT  Goal status: INITIAL     LONG TERM GOALS: Target date: 05/16/2022     Patient will have improved function and activity level as evidenced by an increase in FOTO score by 10 points or more.  Baseline: 50/65 Goal status: INITIAL   2.  Patient will improve left shoulder ROM to be close to symmetrical to right shoulder to return to performing overhead activities to complete job related tasks and yard work.  Baseline: Shoulder Flexion R/L 80*/60* , Shoulder Abduction R/L 80*/60*, Shoulder IR R/L 70/50*, Shoulder ER R/L 90/70*  Goal status: INITIAL   3.  Patient will improve shoulder MMT by >=1/2 muscle grade for improved function to return to completing job related tasks and yard work.  Baseline: Shoulder  Flex R/L 4/3-,Shoulder Abd R/L 4/3- , Shoulder IR R/L 4/4-, Shoulder ER R/L 4/4-, Lower Trap R/L 4-/3-, Mid Trap R/L 4/4 Goal status: INITIAL     PLAN: PT FREQUENCY: 1-2x/week   PT DURATION: 10 weeks   PLANNED INTERVENTIONS: Therapeutic exercises, Therapeutic activity, Balance training, Patient/Family education, Joint manipulation, Joint mobilization, Aquatic Therapy, Dry Needling, Electrical stimulation, Spinal manipulation, Spinal mobilization, Cryotherapy, Moist heat, Manual therapy, and Re-evaluation   PLAN FOR NEXT SESSION: Continue to progress shoulder AROM and parascapular strengthening exercises      Bradly Chris PT,  DPT  03/17/2022, 4:47 PM

## 2022-03-21 ENCOUNTER — Encounter: Payer: Self-pay | Admitting: Physical Therapy

## 2022-03-22 ENCOUNTER — Ambulatory Visit: Payer: Medicaid Other | Admitting: Physical Therapy

## 2022-03-23 ENCOUNTER — Encounter: Payer: Self-pay | Admitting: Physical Therapy

## 2022-03-24 ENCOUNTER — Ambulatory Visit: Payer: Medicaid Other | Admitting: Physical Therapy

## 2022-03-24 ENCOUNTER — Encounter: Payer: Self-pay | Admitting: Physical Therapy

## 2022-03-24 DIAGNOSIS — M25512 Pain in left shoulder: Secondary | ICD-10-CM

## 2022-03-24 NOTE — Therapy (Signed)
OUTPATIENT PHYSICAL THERAPY TREATMENT NOTE   Patient Name: Joshua Cole MRN: [id number] DOB:1961/01/24, 61 y.o., male Today's Date: 03/24/2022  PCP: No PCP   REFERRING PROVIDER: Dr. Quentin Ore   END OF SESSION:   PT End of Session - 03/24/22 1112     Visit Number 5    Number of Visits 20    Date for PT Re-Evaluation 05/16/22    Authorization Type [employer] [employer]    Progress Note Due on Visit 10    PT Start Time 1110    PT Stop Time 1148    PT Time Calculation (min) 38 min    Activity Tolerance Patient tolerated treatment well    Behavior During Therapy WFL for tasks assessed/performed              Past Medical History:  Diagnosis Date   Arthritis    Asthma    Diabetes mellitus without complication ([place])    type 2   DJD (degenerative joint disease)    Dysrhythmia    junctional tachycardia and incomplete heart block   Gout    Lung nodules    a. 09/2021 CT chest: Interval decrease in size and number of bilateral lung nodules, likely consistent with sequelae associated with an infectious/inflammatory process.   MSSA bacteremia 06/2021   Rotator cuff tear 08/26/2021   Subacute bacterial endocarditis (SBE)    a. 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   Past Surgical History:  Procedure Laterality Date   ACHILLES TENDON REPAIR Left    many years ago per pt   BIOPSY  07/16/2021   Procedure: BIOPSY;  Surgeon: DSharyn Creamer MD;  [facility]  Service: Gastroenterology;;  EGD and COLON   COLONOSCOPY N/A 07/16/2021   Procedure: COLONOSCOPY;  Surgeon: DSharyn Creamer MD;  [place]  Service: Gastroenterology;  Laterality: N/A;   COLONOSCOPY WITH PROPOFOL N/A 07/16/2021   Procedure: COLONOSCOPY WITH PROPOFOL;  Surgeon: DSharyn Creamer MD;  [place] [facility]  Service: Gastroenterology;  Laterality: N/A;   ESOPHAGOGASTRODUODENOSCOPY (EGD) WITH PROPOFOL N/A 07/16/2021   Procedure: ESOPHAGOGASTRODUODENOSCOPY (EGD) WITH PROPOFOL;  Surgeon: DSharyn Creamer MD;  [place] [facility]  Service: Gastroenterology;  Laterality: N/A;   FOOT SURGERY Right    ligaments repaired- many years ago per pt   JOINT REPLACEMENT Bilateral    hip   POLYPECTOMY  07/16/2021   Procedure: POLYPECTOMY;  Surgeon: DSharyn Creamer MD;  [facility];  Service: Gastroenterology;;   SHOULDER ARTHROSCOPY WITH ROTATOR CUFF REPAIR AND SUBACROMIAL DECOMPRESSION Left 02/10/2022   Procedure: LEFT SHOULDER ARTHROSCOPY WITH EXTENSIVE DEBRIDEMENT, SUBACROMIAL DECOMPRESSION;  Surgeon: BMcarthur Rossetti MD;  [place]  Service: Orthopedics;  Laterality: Left;   TEE WITHOUT CARDIOVERSION N/A 07/07/2021   Procedure: TRANSESOPHAGEAL ECHOCARDIOGRAM (TEE);  Surgeon: AKate Sable MD;  Location: [facility];  Service: Cardiovascular;  Laterality: N/A;   Patient Active Problem List   Diagnosis Date Noted   Complete tear of left rotator cuff 02/10/2022   Headache 11/02/2021   Peripheral neuropathy 10/05/2021   Acute gout 08/26/2021   Rotator cuff tear 08/26/2021   Encounter to establish care 08/25/2021   Type 2 diabetes mellitus without complications ([facility] 125/01/3975  History of gout 08/25/2021   Left shoulder pain 08/25/2021   Duodenal ulcer    History of colonic  polyps    Acute blood loss anemia    Heart block AV complete (HCC)    Abnormal LFTs    MSSA bacteremia 07/08/2021   Acute bacterial endocarditis    Heart block    Bacteremia    Hyponatremia 16/06/9603   Acute metabolic encephalopathy 54/05/8118   Joint pain 07/03/2021   Generalized weakness 07/03/2021   SIRS (systemic inflammatory response syndrome) ([place]) 07/03/2021   Hypokalemia 07/03/2021   Hyperglycemia 07/03/2021   Thrombocytopenia ([place]) 07/03/2021   Nausea vomiting and diarrhea  07/03/2021   Gout     REFERRING DIAG: Z98.890 (ICD-10-CM) - Status post arthroscopy of left shoulder   THERAPY DIAG:  Acute pain of left shoulder  Rationale for Evaluation and Treatment Rehabilitation  PERTINENT HISTORY: Per note from Dr. [name] on 02/17/22   The patient is following up at 1 week status post a left shoulder arthroscopy with subacromial decompression and extensive debridement.  We found severe synovitis in the shoulder but no rotator cuff tear.  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   I did share with him the arthroscopy pictures showing him the amount of synovitis in his shoulder.  It is essential we get him set up for outpatient physical therapy to work on any modalities that can get his shoulder moving better and decrease his pain.  Incisions look good.  The sutures have been removed.   We will see him back in 4 weeks to see how he is doing overall.  Apparently has a remote history of hip replacements and he needs to have these evaluated.  At his next visit awe will have a AP pelvis and lateral both hips.  PRECAUTIONS: None   SUBJECTIVE: Pt reports that he feels left shoulder has been improving significantly. He felt sick last session and he continues to feel increased hip pain and he thinks it is due to bone infection.   PAIN:  Are you having pain? Yes: NPRS scale: 5/10 Pain location: Left anterior shoulder Pain description: Achy Aggravating factors: moving shoulder overhead  Relieving factors: nothing really helps          VITALS: BP 156/96  HR 97 SpO2 100    DIAGNOSTIC FINDINGS:  CLINICAL DATA:  Left shoulder pain   EXAM: MRI OF THE LEFT SHOULDER WITHOUT AND WITH CONTRAST   TECHNIQUE: Multiplanar, multisequence MR imaging of the left shoulder was performed before and after the  administration of intravenous contrast.   CONTRAST:  15m GADAVIST GADOBUTROL 1 MMOL/ML IV SOLN   COMPARISON:  Left shoulder radiograph 07/12/2021   FINDINGS: Rotator cuff: There is a full-thickness, near full width tear of the supraspinatus tendon at the footprint (coronal T2 image 12). There is high-grade articular sided tearing of the anterior fibers of the infraspinatus tendon at the footprint (sagittal T2 image 4-6). Mild tendinosis of the cephalad fibers of the subscapularis tendon. The teres minor is intact.   Muscles: Intramuscular edema within the infraspinatus and deltoid muscles. No significant muscle atrophy.   Biceps Long Head: Intact intra and extra-articular long head biceps tendon.   Acromioclavicular Joint: Moderate-severe arthropathy of the acromioclavicular joint. There is distension of the subacromial-subdeltoid bursa with thick peripheral enhancement, short axis measuring up to 0.9 cm. (Coronal T1 post-contrast image 12, sagittal post image 5).   Glenohumeral Joint: Trace glenohumeral joint fluid.  Mild chondrosis.   Labrum: Degenerative superior labral tearing extending anteriorly and posteriorly through the biceps labral anchor.   Bones: No fracture or dislocation. No aggressive osseous lesion. No convincing marrow signal changes to suggest osteomyelitis. There is low T1 signal within the scapula, acromion, clavicle, nonspecific.   Other: No fluid collection or hematoma.   IMPRESSION: Full-thickness, near full width tear of the supraspinatus tendon at the footprint. High-grade articular sided tearing of the anterior fibers of the infraspinatus tendon at the footprint. Mild subscapularis tendinosis of the cephalad fibers. No significant muscle atrophy.   Distension of the subacromial-subdeltoid bursa with a peripheral enhancement, short axis measuring up to 0.9 cm. While this could be related to the patient's rotator cuff tear, given adjacent  reactive edema in the deltoid and clinical concern for infection, bursa aspiration should be considered.   Minimal there is minimal glenohumeral joint fluid and without adjacent marrow signal abnormality to suggest osteomyelitis. Low T1 signal within the scapula, acromion and clavicle is nonspecific, possibly related to the patient's anemia.   Degenerative superior labral tearing anteriorly and posteriorly through the biceps labral anchor.   Moderate-severe AC joint arthropathy.     Electronically Signed   By: Maurine Simmering M.D.   On: 07/14/2021 16:59   PATIENT SURVEYS:  FOTO 50/65   COGNITION:           Overall cognitive status: Within functional limits for tasks assessed                                  SENSATION: WFL   POSTURE: Slight round in shoulders    UPPER EXTREMITY ROM:         Active ROM Right eval Left eval  Shoulder flexion 80 60*  Shoulder extension 60 60  Shoulder abduction 80 60*  Shoulder adduction      Shoulder internal rotation 70 50*  Shoulder external rotation 90 70*  Elbow flexion 150 150  Elbow extension 60 60  Wrist flexion 80 80  Wrist extension 70 70  Wrist ulnar deviation 30 30  Wrist radial deviation 20 20  Wrist pronation      Wrist supination      (Blank rows = not tested)      Passive ROM Right eval Left eval  Shoulder flexion 160 180  Shoulder extension 60 60  Shoulder abduction 180  180  Shoulder adduction      Shoulder internal rotation 70 70  Shoulder external rotation 90 90  Elbow flexion 150 150  Elbow extension 60 60  Wrist flexion 80 80  Wrist extension 70 70  Wrist ulnar deviation 30 30  Wrist radial deviation 20 20  Wrist pronation      Wrist supination      (Blank rows = not tested)              UPPER EXTREMITY MMT:   MMT Right eval Left eval  Shoulder flexion 4 3-  Shoulder extension 5 5  Shoulder abduction 4 3-  Shoulder adduction      Shoulder internal rotation 4 4-  Shoulder external  rotation 4 4-  Middle trapezius 4 4  Lower trapezius 4 3-  Elbow flexion 5 5  Elbow extension 5 5  Wrist flexion 5 5  Wrist extension 5 5  Wrist ulnar deviation      Wrist radial deviation      Wrist pronation  Wrist supination      Grip strength (lbs)      (Blank rows = not tested)   SHOULDER SPECIAL TESTS:            Impingement tests:  Painful Arch + L               SLAP lesions:  NT            Instability tests:  NT            Rotator cuff assessment:  Infraspinatus + L            Biceps assessment: Speed's Test: + L        JOINT MOBILITY TESTING:  Shoulder Right Inferior Glides Grade III-firm end feel  Shoulder Right AP Glides Grade III-IV - firm end feel    PALPATION:  TTP in anterior of left shoulder              TODAY'S TREATMENT:   03/24/22 Shoulder Flexion/Extension with Pulleys 3 x 10  Shoulder Abduction/Adduction with Pulleys 3 x 10  Shoulder AROM Flexion/Extension standing with berry colored ball on wall 3 x 10  Shoulder AROM Abduction/Extension standing with berry colored ball on wall 1 x 5 -Pt reports at a 8/10 NPS  Shoulder AROM Abduction/Adduction seated with Red Physioball 3 x 10  OMEGA Face Pulls  #5 2 x 5 Face Pulls with Yellow TB 2 x 5  Standing Shoulder Abduction with #1 DB 1 x 10  -Unable to perform without left upper trap compensation  Supine Shoulder Abduction #1 DB 2 x 10  -Pt able to perform to 90 deg abduction     03/17/22 UBE level 9 for 5 min (2.5 forward and 2.5 backward) OMEGA Seated Scapular Retraction #25 3 x 10  OMEGA Chest Press #10 3 x 10  OMEGA Lat Press #15 2 x 10  OMEGA Lat Press #20 1 x 10 Red Physioball AROM Flexion and Extension on table 3 x 10  Red Physioball AROM Abduction and Adduction on table 3 x 10    03/14/22  UBE Level 2 for 5 min (2.5 forward and 2.5 backward) Shoulder Pulleys AAROM Flexion/Extension 3 x 10  Shoulder Pulleys AAROM Abduction/Adduction 3 x 10  Shoulder AROM Flexon and Extension in standing  3 x 10  Shoulder AROM Abduction and Adduction in standing 3 x 10  Shoulder AROM Flexon and Extension in sitting 3 x 10  Shoulder AROM Abduction and Adduction in sitting 3 x 10  Lat Stretch on left side 3 x 30 sec             Wall Push Up with Plus 3 x 10  Scapular Rows with Black TB 2 x 10   03/10/22  THEREX   UBE level 1 resistance for 7 min    Impingement tests:              Painful Arch + L               Rotator cuff assessment:  Infraspinatus + L            Biceps assessment: Speed's Test: + L  Seated Scapular Retraction use Blue TB 3 x 10  Seated ER at 0 deg abduction with Yellow TB 3 x 10    Initial  AAROM Shoulder Flexion/Extension 3 x 10  AAROM Shoulder Abduction/Adduction 3 x 10  Upper Trap Stretch 4 x 60 sec      PATIENT EDUCATION:  Education details: form and technique for appropriate exercise and explanation of plan of care  Person educated: Patient Education method: Customer service manager and handout  Education comprehension: verbalized understanding, returned demonstration, and verbal cues required     HOME EXERCISE PROGRAM: Access Code: [email or web address] URL: [email or web address] Date: 03/14/2022 Prepared by: Bradly Chris  Exercises Access Code: [facility] URL: [email or web address] Date: 03/24/2022 Prepared by: Bradly Chris  Exercises - Seated Shoulder Abduction Towel Slide at Table Top with Forearm in Neutral  - 1 x daily - 7 x weekly - 3 sets - 10 reps - Seated Shoulder Flexion Towel Slide at Table Top  - 1 x daily - 7 x weekly - 3 sets - 10 reps - Seated Shoulder Flexion AAROM with Pulley Behind  - 1 x daily - 7 x weekly - 3 sets - 10 reps - Seated Shoulder Abduction AAROM with Pulley Behind  - 1 x daily - 7 x weekly - 3 sets - 10 reps - Wall Push Up with Plus  - 1 x daily - 3 x weekly - 3 sets - 10 reps - Seated Upper Trapezius Stretch  - 1 x daily - 7 x weekly - 1 sets - 3 reps - 60 hold - Shoulder External  Rotation and Scapular Retraction with Resistance  - 1 x daily - 3 x weekly - 3 sets - 10 reps - Standing Shoulder Row with Anchored Resistance  - 1 x daily - 3 x weekly - 3 sets - 10 reps - Latissimus Dorsi Stretch at Wall  - 1 x daily - 7 x weekly - 1 sets - 3 reps - 30 hold - Seated Single Arm Shoulder Flexion with Dumbbells  - 1 x daily - 3 x weekly - 2 sets - 10 reps - Supine Shoulder Abduction AROM  - 1 x daily - 3 x weekly - 2 sets - 10 reps - Face Pulls  - 1 x daily - 3 x weekly - 3 sets - 5 reps   ASSESSMENT:   CLINICAL IMPRESSION: Pt continues to show improvements with left shoulder AROM and strength with ability to reach 120 degrees flexion and ability to perform AROM with resistance. He does show limitations with abduction with inability to reach 90 AROM against gravity requiring position change to supine for anti-gravity position. Given gains in ROM, started to parascapular exercises that require increased overhead movement such as face pulls. He will continue to benefit from skilled PT to increase left shoulder ROM and strength to return to using left shoulder for overhead activities.    OBJECTIVE IMPAIRMENTS decreased ROM, decreased strength, hypomobility, impaired UE functional use, and pain.    ACTIVITY LIMITATIONS carrying, lifting, dressing, and reach over head   PARTICIPATION LIMITATIONS: cleaning, occupation, and yard work   PERSONAL FACTORS Fitness, Sex, and Time since onset of injury/illness/exacerbation are also affecting patient's functional outcome.    REHAB POTENTIAL: Good   CLINICAL DECISION MAKING: Stable/uncomplicated   EVALUATION COMPLEXITY: Low     GOALS: Goals reviewed with patient? No   SHORT TERM GOALS: Target date: 03/21/2022     Pt will be independent with HEP in order to improve strength and balance in order to decrease fall risk and improve function at home and work. Baseline: Performing HEP independently  Goal status: Partially Met      LONG  TERM GOALS: Target date: 05/16/2022     Patient will have improved function and activity level as evidenced by an increase in FOTO score by  10 points or more.  Baseline: 50/65 Goal status: ONGOING    2.  Patient will improve left shoulder ROM to be close to symmetrical to right shoulder to return to performing overhead activities to complete job related tasks and yard work.  Baseline: Shoulder Flexion R/L 80*/60* , Shoulder Abduction R/L 80*/60*, Shoulder IR R/L 70/50*, Shoulder ER R/L 90/70*  Goal status: ONGOING    3.  Patient will improve shoulder MMT by >=1/2 muscle grade for improved function to return to completing job related tasks and yard work.  Baseline: Shoulder  Flex R/L 4/3-,Shoulder Abd R/L 4/3- , Shoulder IR R/L 4/4-, Shoulder ER R/L 4/4-, Lower Trap R/L 4-/3-, Mid Trap R/L 4/4 Goal status: ONGOING      PLAN: PT FREQUENCY: 1-2x/week   PT DURATION: 10 weeks   PLANNED INTERVENTIONS: Therapeutic exercises, Therapeutic activity, Balance training, Patient/Family education, Joint manipulation, Joint mobilization, Aquatic Therapy, Dry Needling, Electrical stimulation, Spinal manipulation, Spinal mobilization, Cryotherapy, Moist heat, Manual therapy, and Re-evaluation   PLAN FOR NEXT SESSION: FOTO. Continue to progress shoulder AROM and parascapular strengthening exercises      Bradly Chris PT, DPT  03/24/2022, 11:14 AM

## 2022-03-28 ENCOUNTER — Other Ambulatory Visit: Payer: Self-pay | Admitting: Gerontology

## 2022-03-28 DIAGNOSIS — M25512 Pain in left shoulder: Secondary | ICD-10-CM

## 2022-03-29 ENCOUNTER — Other Ambulatory Visit: Payer: Self-pay

## 2022-03-29 ENCOUNTER — Ambulatory Visit: Payer: Medicaid Other | Admitting: Physical Therapy

## 2022-03-29 ENCOUNTER — Encounter: Payer: Self-pay | Admitting: Physical Therapy

## 2022-03-29 DIAGNOSIS — M25512 Pain in left shoulder: Secondary | ICD-10-CM

## 2022-03-29 MED ORDER — MELOXICAM 15 MG PO TABS
7.5000 mg | ORAL_TABLET | Freq: Every day | ORAL | 0 refills | Status: DC
  Filled 2022-03-29: qty 15, 30d supply, fill #0

## 2022-03-29 NOTE — Therapy (Signed)
OUTPATIENT PHYSICAL THERAPY TREATMENT NOTE   Patient Name: Joshua Cole MRN: [id number] DOB:[birth year], 61 y.o., male Today's Date: 03/29/2022  PCP: No PCP   REFERRING PROVIDER: Dr. Quentin Ore   END OF SESSION:   PT End of Session - 03/29/22 1153     Visit Number 6    Number of Visits 20    Date for PT Re-Evaluation 05/16/22    Authorization Type [employer]    Progress Note Due on Visit 10    PT Start Time 1150    PT Stop Time 1230    PT Time Calculation (min) 40 min    Activity Tolerance Patient tolerated treatment well    Behavior During Therapy WFL for tasks assessed/performed              Past Medical History:  Diagnosis Date   Arthritis    Asthma    Diabetes mellitus without complication ([place])    type 2   DJD (degenerative joint disease)    Dysrhythmia    junctional tachycardia and incomplete heart block   Gout    Lung nodules    a. 09/2021 CT chest: Interval decrease in size and number of bilateral lung nodules, likely consistent with sequelae associated with an infectious/inflammatory process.   MSSA bacteremia 06/2021   Rotator cuff tear 08/26/2021   Subacute bacterial endocarditis (SBE)    a. 06/2021 TEE: mobile mass attached to the tricuspid valve-->Abx rx-->09/2021 TEE: EF 55-60%, no rwma, nl RV fxn, mild-mod RAE, mild MR, mobile echodense 11x16m mass in the TV apparatus-->conservative rx per TCTS.   Past Surgical History:  Procedure Laterality Date   ACHILLES TENDON REPAIR Left    many years ago per pt   BIOPSY  07/16/2021   Procedure: BIOPSY;  Surgeon: DSharyn Creamer MD;  [facility]  Service: Gastroenterology;;  EGD and COLON   COLONOSCOPY N/A 07/16/2021   Procedure: COLONOSCOPY;  Surgeon: DSharyn Creamer MD;  [place] [facility]  Service: Gastroenterology;  Laterality: N/A;   COLONOSCOPY WITH PROPOFOL N/A 07/16/2021   Procedure: COLONOSCOPY WITH PROPOFOL;  Surgeon: DSharyn Creamer MD;  [place]  Service: Gastroenterology;  Laterality: N/A;   ESOPHAGOGASTRODUODENOSCOPY (EGD) WITH PROPOFOL N/A 07/16/2021   Procedure: ESOPHAGOGASTRODUODENOSCOPY (EGD) WITH PROPOFOL;  Surgeon: DSharyn Creamer MD;  [place] [facility]  Service: Gastroenterology;  Laterality: N/A;   FOOT SURGERY Right    ligaments repaired- many years ago per pt   JOINT REPLACEMENT Bilateral    hip   POLYPECTOMY  07/16/2021   Procedure: POLYPECTOMY;  Surgeon: DSharyn Creamer MD;  [facility];  Service: Gastroenterology;;   SHOULDER ARTHROSCOPY WITH ROTATOR CUFF REPAIR AND SUBACROMIAL DECOMPRESSION Left 02/10/2022   Procedure: LEFT SHOULDER ARTHROSCOPY WITH EXTENSIVE DEBRIDEMENT, SUBACROMIAL DECOMPRESSION;  Surgeon: BMcarthur Rossetti MD;  [place] [facility]  Service: Orthopedics;  Laterality: Left;   TEE WITHOUT CARDIOVERSION N/A 07/07/2021   Procedure: TRANSESOPHAGEAL ECHOCARDIOGRAM (TEE);  Surgeon: AKate Sable MD;  Location: [facility];  Service: Cardiovascular;  Laterality: N/A;   Patient Active Problem List   Diagnosis Date Noted   Complete tear of left rotator cuff 02/10/2022   Headache 11/02/2021   Peripheral neuropathy 10/05/2021   Acute gout 08/26/2021   Rotator cuff tear 08/26/2021   Encounter to establish care 08/25/2021   Type 2 diabetes mellitus [place] complications [place] 140/98/1191  History of gout 08/25/2021   Left shoulder pain 08/25/2021   Duodenal ulcer    History of colonic  polyps    Acute blood loss anemia    Heart block AV complete (HCC)    Abnormal LFTs    MSSA bacteremia 07/08/2021   Acute bacterial endocarditis    Heart block    Bacteremia    Hyponatremia 15/40/0867   Acute metabolic encephalopathy 61/95/0932   Joint pain 07/03/2021   Generalized weakness 07/03/2021   SIRS (systemic inflammatory response syndrome) ([place]) 07/03/2021   Hypokalemia 07/03/2021   Hyperglycemia 07/03/2021   Thrombocytopenia ([place]) 07/03/2021   Nausea vomiting and diarrhea  07/03/2021   Gout     REFERRING DIAG: Z98.890 (ICD-10-CM) - Status post arthroscopy of left shoulder   THERAPY DIAG:  Acute pain of left shoulder  Rationale for Evaluation and Treatment Rehabilitation  PERTINENT HISTORY: Per note from Dr. [name] on 02/17/22   The patient is following up at 1 week status post a left shoulder arthroscopy with subacromial decompression and extensive debridement.  We found severe synovitis in the shoulder but no rotator cuff tear.  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   I did share with him the arthroscopy pictures showing him the amount of synovitis in his shoulder.  It is essential we get him set up for outpatient physical therapy to work on any modalities that can get his shoulder moving better and decrease his pain.  Incisions look good.  The sutures have been removed.   We will see him back in 4 weeks to see how he is doing overall.  Apparently has a remote history of hip replacements and he needs to have these evaluated.  At his next visit awe will have a AP pelvis and lateral both hips.  PRECAUTIONS: None   SUBJECTIVE: Pt reports increased tightness in his left shoulder over the weekend and he thinks that he might have overdone it with using his arm.   PAIN:  Are you having pain? Yes: NPRS scale: 3/10 Pain location: Left anterior shoulder Pain description: Achy Aggravating factors: moving shoulder overhead  Relieving factors: nothing really helps          VITALS: BP 156/96  HR 97 SpO2 100    DIAGNOSTIC FINDINGS:  CLINICAL DATA:  Left shoulder pain   EXAM: MRI OF THE LEFT SHOULDER WITHOUT AND WITH CONTRAST   TECHNIQUE: Multiplanar, multisequence MR imaging of the left shoulder was performed before and after the administration of intravenous contrast.   CONTRAST:   36m GADAVIST GADOBUTROL 1 MMOL/ML IV SOLN   COMPARISON:  Left shoulder radiograph 07/12/2021   FINDINGS: Rotator cuff: There is a full-thickness, near full width tear of the supraspinatus tendon at the footprint (coronal T2 image 12). There is high-grade articular sided tearing of the anterior fibers of the infraspinatus tendon at the footprint (sagittal T2 image 4-6). Mild tendinosis of the cephalad fibers of the subscapularis tendon. The teres minor is intact.   Muscles: Intramuscular edema within the infraspinatus and deltoid muscles. No significant muscle atrophy.   Biceps Long Head: Intact intra and extra-articular long head biceps tendon.   Acromioclavicular Joint: Moderate-severe arthropathy of the acromioclavicular joint. There is distension of the subacromial-subdeltoid bursa with thick peripheral enhancement, short axis measuring up to 0.9 cm. (Coronal T1 post-contrast image 12, sagittal post image 5).   Glenohumeral Joint: Trace glenohumeral joint fluid. Mild chondrosis.   Labrum: Degenerative superior labral tearing  extending anteriorly and posteriorly through the biceps labral anchor.   Bones: No fracture or dislocation. No aggressive osseous lesion. No convincing marrow signal changes to suggest osteomyelitis. There is low T1 signal within the scapula, acromion, clavicle, nonspecific.   Other: No fluid collection or hematoma.   IMPRESSION: Full-thickness, near full width tear of the supraspinatus tendon at the footprint. High-grade articular sided tearing of the anterior fibers of the infraspinatus tendon at the footprint. Mild subscapularis tendinosis of the cephalad fibers. No significant muscle atrophy.   Distension of the subacromial-subdeltoid bursa with a peripheral enhancement, short axis measuring up to 0.9 cm. While this could be related to the patient's rotator cuff tear, given adjacent reactive edema in the deltoid and clinical concern for  infection, bursa aspiration should be considered.   Minimal there is minimal glenohumeral joint fluid and without adjacent marrow signal abnormality to suggest osteomyelitis. Low T1 signal within the scapula, acromion and clavicle is nonspecific, possibly related to the patient's anemia.   Degenerative superior labral tearing anteriorly and posteriorly through the biceps labral anchor.   Moderate-severe AC joint arthropathy.     Electronically Signed   By: Maurine Simmering M.D.   On: 07/14/2021 16:59   PATIENT SURVEYS:  FOTO 50/65   COGNITION:           Overall cognitive status: Within functional limits for tasks assessed                                  SENSATION: WFL   POSTURE: Slight round in shoulders    UPPER EXTREMITY ROM:         Active ROM Right eval Left eval  Shoulder flexion 80 60*  Shoulder extension 60 60  Shoulder abduction 80 60*  Shoulder adduction      Shoulder internal rotation 70 50*  Shoulder external rotation 90 70*  Elbow flexion 150 150  Elbow extension 60 60  Wrist flexion 80 80  Wrist extension 70 70  Wrist ulnar deviation 30 30  Wrist radial deviation 20 20  Wrist pronation      Wrist supination      (Blank rows = not tested)      Passive ROM Right eval Left eval  Shoulder flexion 160 180  Shoulder extension 60 60  Shoulder abduction 180  180  Shoulder adduction      Shoulder internal rotation 70 70  Shoulder external rotation 90 90  Elbow flexion 150 150  Elbow extension 60 60  Wrist flexion 80 80  Wrist extension 70 70  Wrist ulnar deviation 30 30  Wrist radial deviation 20 20  Wrist pronation      Wrist supination      (Blank rows = not tested)              UPPER EXTREMITY MMT:   MMT Right eval Left eval  Shoulder flexion 4 3-  Shoulder extension 5 5  Shoulder abduction 4 3-  Shoulder adduction      Shoulder internal rotation 4 4-  Shoulder external rotation 4 4-  Middle trapezius 4 4  Lower trapezius 4  3-  Elbow flexion 5 5  Elbow extension 5 5  Wrist flexion 5 5  Wrist extension 5 5  Wrist ulnar deviation      Wrist radial deviation      Wrist pronation      Wrist supination  Grip strength (lbs)      (Blank rows = not tested)   SHOULDER SPECIAL TESTS:            Impingement tests:  Painful Arch + L               SLAP lesions:  NT            Instability tests:  NT            Rotator cuff assessment:  Infraspinatus + L            Biceps assessment: Speed's Test: + L        JOINT MOBILITY TESTING:  Shoulder Right Inferior Glides Grade III-firm end feel  Shoulder Right AP Glides Grade III-IV - firm end feel    PALPATION:  TTP in anterior of left shoulder              TODAY'S TREATMENT:   03/26/22 UBE seat level 9 with level 3 resistance  Shoulder Abduction/Adduction AAROM pulleys 3 x 10  -Pt able to reach 90 deg of abduction on left shoulder  Shoulder Flexion/Extension AAROM pulleys 3 x 10  Shoulder AROM Flexion/Extension to 90 deg 2 x 10  Shoulder AROM Abduction/Adduction -Left shoulder only able to abduct to 60 deg  MANUAL   Upper Trap myofacial release and massage   Upper Trap Stretch  Cervical Traction  Left Shoulder Traction  Left Shoulder PROM Abduction to 120 degrees     03/24/22 Shoulder Flexion/Extension with Pulleys 3 x 10  Shoulder Abduction/Adduction with Pulleys 3 x 10  Shoulder AROM Flexion/Extension standing with berry colored ball on wall 3 x 10  Shoulder AROM Abduction/Extension standing with berry colored ball on wall 1 x 5 -Pt reports at a 8/10 NPS  Shoulder AROM Abduction/Adduction seated with Red Physioball 3 x 10  OMEGA Face Pulls  #5 2 x 5 Face Pulls with Yellow TB 2 x 5  Standing Shoulder Abduction with #1 DB 1 x 10  -Unable to perform without left upper trap compensation  Supine Shoulder Abduction #1 DB 2 x 10  -Pt able to perform to 90 deg abduction     03/17/22 UBE level 9 for 5 min (2.5 forward and 2.5 backward) OMEGA Seated  Scapular Retraction #25 3 x 10  OMEGA Chest Press #10 3 x 10  OMEGA Lat Press #15 2 x 10  OMEGA Lat Press #20 1 x 10 Red Physioball AROM Flexion and Extension on table 3 x 10  Red Physioball AROM Abduction and Adduction on table 3 x 10    03/14/22  UBE Level 2 for 5 min (2.5 forward and 2.5 backward) Shoulder Pulleys AAROM Flexion/Extension 3 x 10  Shoulder Pulleys AAROM Abduction/Adduction 3 x 10  Shoulder AROM Flexon and Extension in standing 3 x 10  Shoulder AROM Abduction and Adduction in standing 3 x 10  Shoulder AROM Flexon and Extension in sitting 3 x 10  Shoulder AROM Abduction and Adduction in sitting 3 x 10  Lat Stretch on left side 3 x 30 sec             Wall Push Up with Plus 3 x 10  Scapular Rows with Black TB 2 x 10     PATIENT EDUCATION: Education details: form and technique for appropriate exercise and explanation of plan of care  Person educated: Patient Education method: Customer service manager and handout  Education comprehension: verbalized understanding, returned demonstration, and verbal cues required  HOME EXERCISE PROGRAM: Access Code: [email or web address] URL: [email or web address] Date: 03/14/2022 Prepared by: Bradly Chris  Exercises Access Code: [facility] URL: [email or web address] Date: 03/24/2022 Prepared by: Bradly Chris  Exercises - Seated Shoulder Abduction Towel Slide at Table Top with Forearm in Neutral  - 1 x daily - 7 x weekly - 3 sets - 10 reps - Seated Shoulder Flexion Towel Slide at Table Top  - 1 x daily - 7 x weekly - 3 sets - 10 reps - Seated Shoulder Flexion AAROM with Pulley Behind  - 1 x daily - 7 x weekly - 3 sets - 10 reps - Seated Shoulder Abduction AAROM with Pulley Behind  - 1 x daily - 7 x weekly - 3 sets - 10 reps - Wall Push Up with Plus  - 1 x daily - 3 x weekly - 3 sets - 10 reps - Seated Upper Trapezius Stretch  - 1 x daily - 7 x weekly - 1 sets - 3 reps - 60 hold - Shoulder External  Rotation and Scapular Retraction with Resistance  - 1 x daily - 3 x weekly - 3 sets - 10 reps - Standing Shoulder Row with Anchored Resistance  - 1 x daily - 3 x weekly - 3 sets - 10 reps - Latissimus Dorsi Stretch at Wall  - 1 x daily - 7 x weekly - 1 sets - 3 reps - 30 hold - Seated Single Arm Shoulder Flexion with Dumbbells  - 1 x daily - 3 x weekly - 2 sets - 10 reps - Supine Shoulder Abduction AROM  - 1 x daily - 3 x weekly - 2 sets - 10 reps - Face Pulls  - 1 x daily - 3 x weekly - 3 sets - 5 reps   ASSESSMENT:   CLINICAL IMPRESSION: Pt continues to show progress with left shoulder with ability to perform resisted shoulder flexion and abduction albeit to <90 for abduction because of increased pain past 60 degrees. His left shoulder AROM restrictions appear to be more muscular in origin as PROM of 120 degrees achieved with left shoulder distraction applied. No muscular restriction encounter in left upper trap despite increased use with compensation for shoulder elevation. He will continue to benefit from skilled PT to increase left shoulder ROM and strength to return to using left shoulder for overhead activities.    OBJECTIVE IMPAIRMENTS decreased ROM, decreased strength, hypomobility, impaired UE functional use, and pain.    ACTIVITY LIMITATIONS carrying, lifting, dressing, and reach over head   PARTICIPATION LIMITATIONS: cleaning, occupation, and yard work   PERSONAL FACTORS Fitness, Sex, and Time since onset of injury/illness/exacerbation are also affecting patient's functional outcome.    REHAB POTENTIAL: Good   CLINICAL DECISION MAKING: Stable/uncomplicated   EVALUATION COMPLEXITY: Low     GOALS: Goals reviewed with patient? No   SHORT TERM GOALS: Target date: 03/21/2022     Pt will be independent with HEP in order to improve strength and balance in order to decrease fall risk and improve function at home and work. Baseline: Performing HEP independently  Goal status:  Partially Met      LONG TERM GOALS: Target date: 05/16/2022     Patient will have improved function and activity level as evidenced by an increase in FOTO score by 10 points or more.  Baseline: 50/65 Goal status: ONGOING    2.  Patient will improve left shoulder ROM to be close to symmetrical to right shoulder to return to performing overhead activities to  complete job related tasks and yard work.  Baseline: Shoulder Flexion R/L 80*/60* , Shoulder Abduction R/L 80*/60*, Shoulder IR R/L 70/50*, Shoulder ER R/L 90/70*  Goal status: ONGOING    3.  Patient will improve shoulder MMT by >=1/2 muscle grade for improved function to return to completing job related tasks and yard work.  Baseline: Shoulder  Flex R/L 4/3-,Shoulder Abd R/L 4/3- , Shoulder IR R/L 4/4-, Shoulder ER R/L 4/4-, Lower Trap R/L 4-/3-, Mid Trap R/L 4/4 Goal status: ONGOING      PLAN: PT FREQUENCY: 1-2x/week   PT DURATION: 10 weeks   PLANNED INTERVENTIONS: Therapeutic exercises, Therapeutic activity, Balance training, Patient/Family education, Joint manipulation, Joint mobilization, Aquatic Therapy, Dry Needling, Electrical stimulation, Spinal manipulation, Spinal mobilization, Cryotherapy, Moist heat, Manual therapy, and Re-evaluation   PLAN FOR NEXT SESSION: FOTO. Continue to progress shoulder AROM and parascapular strengthening exercises     Bradly Chris PT, DPT  03/29/2022, 11:54 AM

## 2022-03-30 ENCOUNTER — Other Ambulatory Visit: Payer: Self-pay

## 2022-03-30 ENCOUNTER — Ambulatory Visit
Admission: RE | Admit: 2022-03-30 | Discharge: 2022-03-30 | Disposition: A | Discharge status: Home / Self Care | Payer: Medicaid Other | Source: Ambulatory Visit | Attending: Physician Assistant | Admitting: Physician Assistant

## 2022-03-30 ENCOUNTER — Encounter
Admission: RE | Admit: 2022-03-30 | Discharge: 2022-03-30 | Disposition: A | Discharge status: Home / Self Care | Payer: Medicaid Other | Source: Ambulatory Visit | Attending: Physician Assistant | Admitting: Physician Assistant

## 2022-03-30 DIAGNOSIS — Z96643 Presence of artificial hip joint, bilateral: Secondary | ICD-10-CM | POA: Insufficient documentation

## 2022-03-30 DIAGNOSIS — Z96642 Presence of left artificial hip joint: Secondary | ICD-10-CM | POA: Insufficient documentation

## 2022-03-30 MED ORDER — TECHNETIUM TC 99M MEDRONATE IV KIT
20.0000 | PACK | Freq: Once | INTRAVENOUS | Status: AC | PRN
  Administered 2022-03-30: 19.84 via INTRAVENOUS

## 2022-03-31 ENCOUNTER — Encounter: Payer: Self-pay | Admitting: Gerontology

## 2022-03-31 ENCOUNTER — Ambulatory Visit: Payer: Self-pay | Admitting: Gerontology

## 2022-03-31 ENCOUNTER — Other Ambulatory Visit: Payer: Self-pay

## 2022-03-31 VITALS — BP 130/85 | HR 87 | Temp 97.9°F | Ht 70.0 in | Wt 236.9 lb

## 2022-03-31 DIAGNOSIS — K219 Gastro-esophageal reflux disease without esophagitis: Secondary | ICD-10-CM

## 2022-03-31 DIAGNOSIS — E119 Type 2 diabetes mellitus without complications: Secondary | ICD-10-CM

## 2022-03-31 DIAGNOSIS — R0602 Shortness of breath: Secondary | ICD-10-CM

## 2022-03-31 DIAGNOSIS — M25552 Pain in left hip: Secondary | ICD-10-CM

## 2022-03-31 DIAGNOSIS — G629 Polyneuropathy, unspecified: Secondary | ICD-10-CM

## 2022-03-31 DIAGNOSIS — M25512 Pain in left shoulder: Secondary | ICD-10-CM

## 2022-03-31 LAB — POCT GLYCOSYLATED HEMOGLOBIN (HGB A1C): Hemoglobin A1C: 7.4 % — AB (ref 4.0–5.6)

## 2022-03-31 LAB — GLUCOSE, POCT (MANUAL RESULT ENTRY): POC Glucose: 113 mg/dl — AB (ref 70–99)

## 2022-03-31 MED ORDER — GABAPENTIN 300 MG PO CAPS
300.0000 mg | ORAL_CAPSULE | Freq: Two times a day (BID) | ORAL | 2 refills | Status: DC
  Filled 2022-03-31: qty 60, 30d supply, fill #0
  Filled 2022-05-06: qty 60, 30d supply, fill #1
  Filled 2022-07-13: qty 60, 30d supply, fill #2

## 2022-03-31 MED ORDER — ALBUTEROL SULFATE (2.5 MG/3ML) 0.083% IN NEBU
2.5000 mg | INHALATION_SOLUTION | Freq: Four times a day (QID) | RESPIRATORY_TRACT | 2 refills | Status: DC | PRN
  Filled 2022-03-31: qty 75, 7d supply, fill #0

## 2022-03-31 MED ORDER — PANTOPRAZOLE SODIUM 40 MG PO TBEC
40.0000 mg | DELAYED_RELEASE_TABLET | ORAL | 1 refills | Status: DC
  Filled 2022-03-31: qty 30, 60d supply, fill #0

## 2022-03-31 MED ORDER — MELOXICAM 15 MG PO TABS
15.0000 mg | ORAL_TABLET | Freq: Every day | ORAL | 0 refills | Status: DC
  Filled 2022-03-31 – 2022-04-15 (×4): qty 30, 30d supply, fill #0

## 2022-03-31 MED ORDER — METFORMIN HCL 1000 MG PO TABS
500.0000 mg | ORAL_TABLET | Freq: Two times a day (BID) | ORAL | 2 refills | Status: DC
  Filled 2022-03-31: qty 60, 60d supply, fill #0
  Filled 2022-07-13: qty 60, 60d supply, fill #1
  Filled 2022-08-23: qty 60, 60d supply, fill #2

## 2022-03-31 NOTE — Progress Notes (Signed)
Established Patient Office Visit  Subjective   Patient ID: Joshua Cole, male    DOB: [birth year]  Age: 61 y.o. MRN: [id number]  Chief Complaint  Patient presents with   Follow-up    HPI  Joshua Cole  is a 61 year old male who has history of arthritis, type 2 diabetes, DJD, Gout who presents for routine follow up. His HgbA1c done during visit increased from 7.2% to 7.4%, he checks his blood glucose every other day but has not checked it does morning.  His blood glucose reading was 113 mg/dl.  He denies hypo-/hyperglycemic symptoms, and performs daily foot check.  Currently, he complains of worsening neuropathy to his bilateral thighs and taking 200 mg gabapentin bid  is not relieving symptoms.  He continues to follow-up with physical therapy post left shoulder arthroscopy that was done on 02/10/2022.  He was seen at the orthopedic clinic on 03/16/2022 for complaint of worsening left hip pain.  He reports having hip replacement 20 years ago but continues to experience constant sharp pain to his left hip.  He states that pain is sharp, nonradiating and intensity increases up to 10/10.  He states that changing position from a sitting to a standing and walking aggravates symptoms.  He ambulates with a cane with an antalgic gait to the left leg.  He denies muscle/motor weakness.  He had bone scan to the hip done on 03/30/22 and it showed 1. Abnormal appearance of the left hip arthroplasty, with increased radiotracer activity on all 3 phases of the exam, which could reflect infection or loosening. 2. No abnormal activity associated with the right hip arthroplasty and will follow up with Orthopedic Surgeon.  He also complains of worsening acid reflux that has been going on for 3 months.  Overall, he states that he is doing well and offers no further complaint.    Review of Systems  Constitutional: Negative.   Eyes: Negative.   Respiratory: Negative.    Cardiovascular: Negative.   Musculoskeletal:   Positive for joint pain (left hip pain).       Left hip pain  Neurological:  Positive for tingling (and numbness).  Psychiatric/Behavioral: Negative.        Objective:     BP 130/85 (BP Location: Right Arm, Patient Position: Sitting, Cuff Size: Large)   Pulse 87   Temp 97.9 F (36.6 C) (Oral)   Ht '5\' 10"'$  (1.778 m)   Wt 236 lb 14.4 oz (107.5 kg)   SpO2 95%   BMI 33.99 kg/m  BP Readings from Last 3 Encounters:  03/31/22 130/85  03/02/22 122/68  02/10/22 120/79   Wt Readings from Last 3 Encounters:  03/31/22 236 lb 14.4 oz (107.5 kg)  03/02/22 232 lb (105.2 kg)  02/10/22 235 lb (106.6 kg)      Physical Exam   Results for orders placed or performed in visit on 03/31/22  POCT Glucose (CBG)  Result Value Ref Range   POC Glucose 113 (A) 70 - 99 mg/dl  POCT HgB A1C  Result Value Ref Range   Hemoglobin A1C 7.4 (A) 4.0 - 5.6 %   HbA1c POC (<> result, manual entry)     HbA1c, POC (prediabetic range)     HbA1c, POC (controlled diabetic range)      Last CBC Lab Results  Component Value Date   WBC 9.1 03/16/2022   HGB 13.9 03/16/2022   HCT 41.0 03/16/2022   MCV 96.5 03/16/2022   MCH 32.7 03/16/2022   RDW 12.6  03/16/2022   PLT CANCELED 37/62/8315   Last metabolic panel Lab Results  Component Value Date   GLUCOSE 184 (H) 02/02/2022   NA 135 02/02/2022   K 4.2 02/02/2022   CL 103 02/02/2022   CO2 27 02/02/2022   BUN 8 02/02/2022   CREATININE 0.67 02/02/2022   GFRNONAA >60 02/02/2022   CALCIUM 9.6 02/02/2022   PHOS 4.3 07/16/2021   PROT 8.9 (H) 09/20/2021   ALBUMIN 4.2 09/20/2021   LABGLOB 4.7 (H) 09/20/2021   AGRATIO 0.9 (L) 09/20/2021   BILITOT 0.5 09/20/2021   ALKPHOS 90 09/20/2021   AST 13 09/20/2021   ALT 2 09/20/2021   ANIONGAP 5 02/02/2022   Last lipids Lab Results  Component Value Date   CHOL 135 08/25/2021   HDL 34 (L) 08/25/2021   LDLCALC 84 08/25/2021   TRIG 88 08/25/2021   CHOLHDL 4.0 08/25/2021   Last hemoglobin A1c Lab Results   Component Value Date   HGBA1C 7.4 (A) 03/31/2022   Last thyroid functions Lab Results  Component Value Date   TSH 0.162 (L) 07/07/2021   T4TOTAL 5.0 07/07/2021      The 10-year ASCVD risk score (Arnett DK, et al., 2019) is: 13.9%    Assessment & Plan:    1. Type 2 diabetes mellitus without complication, with long-term current use of insulin (HCC) -His hemoglobin A1c decreased from 7.2 to 7.4%, he was started on metformin 500 mg twice daily, educated on medication side effects and advised to notify clinic. He was encouraged to continue on low carbohydrate/low concentrated sweet diet and exercise as tolerated. - POCT HgB A1C; Future - POCT Glucose (CBG); Future - POCT Glucose (CBG) - POCT HgB A1C - metFORMIN (GLUCOPHAGE) 1000 MG tablet; Take 0.5 tablets (500 mg total) by mouth 2 (two) times daily with a meal.  Dispense: 60 tablet; Refill: 2  2. Peripheral polyneuropathy -Neuropathy to his bilateral thighs is not improving his gabapentin was increased to 300 mg twice daily and was advised to notify clinic for worsening symptoms - gabapentin (NEURONTIN) 300 MG capsule; Take 1 capsule (300 mg total) by mouth 2 (two) times daily.  Dispense: 60 capsule; Refill: 2  3. Left shoulder pain, unspecified chronicity -He will continue on meloxicam for pain, educated on medication side effects and advised to notify clinic. - meloxicam (MOBIC) 15 MG tablet; Take 1 tablet (15 mg total) by mouth daily.  Dispense: 30 tablet; Refill: 0  4. Gastroesophageal reflux disease, unspecified whether esophagitis present -He was started on Protonix 40 mg daily, educated on medication side effects and advised to notify clinic. -Avoid spicy, fatty and fried food -Avoid sodas and sour juices -Avoid heavy meals -Avoid eating 4 hours before bedtime -Elevate head of bed at night - pantoprazole (PROTONIX) 40 MG tablet; Take 1 tablet (40 mg total) by mouth every other day. Taking every other day, old rx   Dispense: 30 tablet; Refill: 1  5. Shortness of breath -His breathing is stable he will continue on current medication and advised to go to the emergency room for worsening symptoms. - albuterol (PROVENTIL) (2.5 MG/3ML) 0.083% nebulizer solution; Take 3 mLs (2.5 mg total) by nebulization every 6 (six) hours as needed for wheezing or shortness of breath.  Dispense: 75 mL; Refill: 2  6. Left hip pain -He will continue on meloxicam 15 mg daily, was educated on medication side effects and advised to notify clinic.  He will follow-up with the orthopedic surgeon.   Return in about 13 weeks (  around 06/30/2022), or if symptoms worsen or fail to improve.    Ninfa Giannelli Jerold Coombe, NP

## 2022-03-31 NOTE — Patient Instructions (Signed)

## 2022-04-01 ENCOUNTER — Other Ambulatory Visit: Payer: Self-pay

## 2022-04-01 ENCOUNTER — Other Ambulatory Visit: Payer: Self-pay | Admitting: Gerontology

## 2022-04-01 DIAGNOSIS — R0602 Shortness of breath: Secondary | ICD-10-CM

## 2022-04-02 ENCOUNTER — Other Ambulatory Visit: Payer: Self-pay

## 2022-04-04 ENCOUNTER — Other Ambulatory Visit: Payer: Self-pay

## 2022-04-05 ENCOUNTER — Other Ambulatory Visit: Payer: Self-pay

## 2022-04-05 ENCOUNTER — Ambulatory Visit (INDEPENDENT_AMBULATORY_CARE_PROVIDER_SITE_OTHER): Payer: Self-pay | Admitting: Orthopaedic Surgery

## 2022-04-05 ENCOUNTER — Other Ambulatory Visit: Payer: Self-pay | Admitting: Gerontology

## 2022-04-05 DIAGNOSIS — R0602 Shortness of breath: Secondary | ICD-10-CM

## 2022-04-05 DIAGNOSIS — Z96642 Presence of left artificial hip joint: Secondary | ICD-10-CM

## 2022-04-05 DIAGNOSIS — M25552 Pain in left hip: Secondary | ICD-10-CM

## 2022-04-05 MED ORDER — ALBUTEROL SULFATE HFA 108 (90 BASE) MCG/ACT IN AERS
1.0000 | INHALATION_SPRAY | Freq: Four times a day (QID) | RESPIRATORY_TRACT | 3 refills | Status: AC | PRN
  Filled 2022-04-05: qty 6.7, 25d supply, fill #0
  Filled 2022-07-13: qty 6.7, 25d supply, fill #1

## 2022-04-05 NOTE — Progress Notes (Signed)
The patient comes in today to go over three-phase bone scan as a relates to a painful left total hip arthroplasty.  He had his hips replaced in [place] many years ago.  This was done through a posterior approach.  Unfortunately last year he developed endocarditis and has significant bacteremia.  He does ambulate with a cane and has had left hip pain since he was hospitalized back last year.  Plain films of the hip appeared normal.  His acute phase reactant labs including sed rate and CRP are both elevated.  We sent her for three-phase bone scan.  The three-phase bone scan is worrisome for prosthetic loosening versus infection.  Examination of his left hip shows pain with rotation.  There is no warmth or redness.  Incisions well-healed.  At this point I need to send him for an aspiration under fluoroscopy of the left hip and have this sent for at minimum Gram stain and culture with hopefully also cell count depending on the amount of fluid.  We can then proceed with there on determining the best treatment options which would likely be an excision arthroplasty if there is an infection.  Patient is we have the results of the aspiration we can let him know.  He agrees with this treatment plan as well.

## 2022-04-06 ENCOUNTER — Other Ambulatory Visit: Payer: Self-pay

## 2022-04-06 DIAGNOSIS — Z96642 Presence of left artificial hip joint: Secondary | ICD-10-CM

## 2022-04-06 DIAGNOSIS — M25552 Pain in left hip: Secondary | ICD-10-CM

## 2022-04-07 ENCOUNTER — Ambulatory Visit: Payer: Medicaid Other | Admitting: Physical Therapy

## 2022-04-07 ENCOUNTER — Encounter: Payer: Self-pay | Admitting: Physical Therapy

## 2022-04-07 DIAGNOSIS — M25512 Pain in left shoulder: Secondary | ICD-10-CM

## 2022-04-07 NOTE — Therapy (Signed)
OUTPATIENT PHYSICAL THERAPY PROGRESS NOTE   Patient Name: Joshua Cole MRN: [id number] DOB:November 01, 1960, 61 y.o., male Today's Date: 04/07/2022  PCP: No PCP   REFERRING PROVIDER: Dr. Quentin Ore   END OF SESSION:   PT End of Session - 04/07/22 1104     Visit Number 7    Number of Visits 20    Date for PT Re-Evaluation 05/16/22    Authorization Type [place] [employer]    Progress Note Due on Visit 10    PT Start Time 1100    PT Stop Time 1145    PT Time Calculation (min) 45 min    Activity Tolerance Patient tolerated treatment well    Behavior During Therapy WFL for tasks assessed/performed              Past Medical History:  Diagnosis Date   Arthritis    Asthma    Diabetes mellitus without complication ([place])    type 2   DJD (degenerative joint disease)    Dysrhythmia    junctional tachycardia and incomplete heart block   Gout    Lung nodules    a. 09/2021 CT chest: Interval decrease in size and number of bilateral lung nodules, likely consistent with sequelae associated with an infectious/inflammatory process.   MSSA bacteremia 06/2021   Rotator cuff tear 08/26/2021   Subacute bacterial endocarditis (SBE)    a. 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   Past Surgical History:  Procedure Laterality Date   ACHILLES TENDON REPAIR Left    many years ago per pt   BIOPSY  07/16/2021   Procedure: BIOPSY;  Surgeon: Sharyn Creamer, MD;  Location: [facility] ENDOSCOPY;  Service: Gastroenterology;;  EGD and COLON   COLONOSCOPY N/A 07/16/2021   Procedure: COLONOSCOPY;  Surgeon: Sharyn Creamer, MD;  Location: [place];  Service: Gastroenterology;  Laterality: N/A;   COLONOSCOPY WITH PROPOFOL N/A 07/16/2021   Procedure: COLONOSCOPY WITH PROPOFOL;  Surgeon: Sharyn Creamer, MD;  Location: [place];  Service: Gastroenterology;  Laterality: N/A;   ESOPHAGOGASTRODUODENOSCOPY (EGD) WITH PROPOFOL N/A 07/16/2021   Procedure: ESOPHAGOGASTRODUODENOSCOPY (EGD) WITH PROPOFOL;  Surgeon: Sharyn Creamer, MD;  Location: [place];  Service: Gastroenterology;  Laterality: N/A;   FOOT SURGERY Right    ligaments repaired- many years ago per pt   JOINT REPLACEMENT Bilateral    hip   POLYPECTOMY  07/16/2021   Procedure: POLYPECTOMY;  Surgeon: Sharyn Creamer, MD;  Location: [facility] ENDOSCOPY;  Service: Gastroenterology;;   SHOULDER ARTHROSCOPY WITH ROTATOR CUFF REPAIR AND SUBACROMIAL DECOMPRESSION Left 02/10/2022   Procedure: LEFT SHOULDER ARTHROSCOPY WITH EXTENSIVE DEBRIDEMENT, SUBACROMIAL DECOMPRESSION;  Surgeon: Mcarthur Rossetti, MD;  Location: [place];  Service: Orthopedics;  Laterality: Left;   TEE WITHOUT CARDIOVERSION N/A 07/07/2021   Procedure: TRANSESOPHAGEAL ECHOCARDIOGRAM (TEE);  Surgeon: Kate Sable, MD;  Location: [facility];  Service: Cardiovascular;  Laterality: N/A;   Patient Active Problem List   Diagnosis Date Noted   Acid reflux 03/31/2022   Left hip pain 03/31/2022   Complete tear of left rotator cuff 02/10/2022   Headache 11/02/2021   Peripheral neuropathy 10/05/2021   Acute gout 08/26/2021   Rotator cuff tear 08/26/2021   Encounter to establish care 08/25/2021   Type 2 diabetes mellitus without complications ([place]) 27/11/5007   History of gout 08/25/2021   Left shoulder pain  08/25/2021   Duodenal ulcer    History of colonic polyps    Acute blood loss anemia    Heart block AV complete ([facility])    Abnormal LFTs    MSSA bacteremia 07/08/2021   Acute bacterial endocarditis    Heart block    Bacteremia    Hyponatremia 97/98/9211   Acute metabolic encephalopathy 94/17/4081   Joint pain 07/03/2021   Generalized weakness 07/03/2021   SIRS (systemic inflammatory response syndrome) ([place]) 07/03/2021   Hypokalemia 07/03/2021   Hyperglycemia 07/03/2021    Thrombocytopenia ([place]) 07/03/2021   Nausea vomiting and diarrhea 07/03/2021   Gout     REFERRING DIAG: Z98.890 (ICD-10-CM) - Status post arthroscopy of left shoulder   THERAPY DIAG:  Acute pain of left shoulder  Rationale for Evaluation and Treatment Rehabilitation  PERTINENT HISTORY: Per note from Dr. [name] on 02/17/22   The patient is following up at 1 week status post a left shoulder arthroscopy with subacromial decompression and extensive debridement.  We found severe synovitis in the shoulder but no rotator cuff tear.  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   I did share with him the arthroscopy pictures showing him the amount of synovitis in his shoulder.  It is essential we get him set up for outpatient physical therapy to work on any modalities that can get his shoulder moving better and decrease his pain.  Incisions look good.  The sutures have been removed.   We will see him back in 4 weeks to see how he is doing overall.  Apparently has a remote history of hip replacements and he needs to have these evaluated.  At his next visit awe will have a AP pelvis and lateral both hips.  PRECAUTIONS: None   SUBJECTIVE: Pt reports that he has been having a lot of medical complications lately. He feels that the exercises have been making a difference with functional tasks such as turning on a light switch or reaching up to cabinet.   PAIN:  Are you having pain? Yes: NPRS scale: 3/10 Pain location: Left anterior shoulder Pain description: Achy Aggravating factors: moving shoulder overhead  Relieving factors: nothing really helps          VITALS: BP 156/96  HR 97 SpO2 100    DIAGNOSTIC FINDINGS:  CLINICAL DATA:  Left shoulder pain   EXAM: MRI OF THE LEFT SHOULDER WITHOUT AND WITH CONTRAST    TECHNIQUE: Multiplanar, multisequence MR imaging of the left shoulder was performed before and after the administration of intravenous contrast.   CONTRAST:  67mL GADAVIST GADOBUTROL 1 MMOL/ML IV SOLN   COMPARISON:  Left shoulder radiograph 07/12/2021   FINDINGS: Rotator cuff: There is a full-thickness, near full width tear of the supraspinatus tendon at the footprint (coronal T2 image 12). There is high-grade articular sided tearing of the anterior fibers of the infraspinatus tendon at the footprint (sagittal T2 image 4-6). Mild tendinosis of the cephalad fibers of the subscapularis tendon. The teres minor is intact.   Muscles: Intramuscular edema within the infraspinatus and deltoid muscles. No significant muscle atrophy.   Biceps Long Head: Intact intra and extra-articular long head biceps tendon.   Acromioclavicular Joint: Moderate-severe arthropathy of the acromioclavicular joint. There is distension of the subacromial-subdeltoid bursa with thick peripheral enhancement, short axis measuring up to 0.9 cm. (Coronal  T1 post-contrast image 12, sagittal post image 5).   Glenohumeral Joint: Trace glenohumeral joint fluid. Mild chondrosis.   Labrum: Degenerative superior labral tearing extending anteriorly and posteriorly through the biceps labral anchor.   Bones: No fracture or dislocation. No aggressive osseous lesion. No convincing marrow signal changes to suggest osteomyelitis. There is low T1 signal within the scapula, acromion, clavicle, nonspecific.   Other: No fluid collection or hematoma.   IMPRESSION: Full-thickness, near full width tear of the supraspinatus tendon at the footprint. High-grade articular sided tearing of the anterior fibers of the infraspinatus tendon at the footprint. Mild subscapularis tendinosis of the cephalad fibers. No significant muscle atrophy.   Distension of the subacromial-subdeltoid bursa with a peripheral enhancement, short axis  measuring up to 0.9 cm. While this could be related to the patient's rotator cuff tear, given adjacent reactive edema in the deltoid and clinical concern for infection, bursa aspiration should be considered.   Minimal there is minimal glenohumeral joint fluid and without adjacent marrow signal abnormality to suggest osteomyelitis. Low T1 signal within the scapula, acromion and clavicle is nonspecific, possibly related to the patient's anemia.   Degenerative superior labral tearing anteriorly and posteriorly through the biceps labral anchor.   Moderate-severe AC joint arthropathy.     Electronically Signed   By: Maurine Simmering M.D.   On: 07/14/2021 16:59   PATIENT SURVEYS:  FOTO 50/65   COGNITION:           Overall cognitive status: Within functional limits for tasks assessed                                  SENSATION: WFL   POSTURE: Slight round in shoulders    UPPER EXTREMITY ROM:         Active ROM Right eval Left eval  Shoulder flexion 80 60*  Shoulder extension 60 60  Shoulder abduction 80 60*  Shoulder adduction      Shoulder internal rotation 70 50*  Shoulder external rotation 90 70*  Elbow flexion 150 150  Elbow extension 60 60  Wrist flexion 80 80  Wrist extension 70 70  Wrist ulnar deviation 30 30  Wrist radial deviation 20 20  Wrist pronation      Wrist supination      (Blank rows = not tested)      Passive ROM Right eval Left eval  Shoulder flexion 160 180  Shoulder extension 60 60  Shoulder abduction 180  180  Shoulder adduction      Shoulder internal rotation 70 70  Shoulder external rotation 90 90  Elbow flexion 150 150  Elbow extension 60 60  Wrist flexion 80 80  Wrist extension 70 70  Wrist ulnar deviation 30 30  Wrist radial deviation 20 20  Wrist pronation      Wrist supination      (Blank rows = not tested)              UPPER EXTREMITY MMT:   MMT Right eval Left eval  Shoulder flexion 4 3-  Shoulder extension 5 5   Shoulder abduction 4 3-  Shoulder adduction      Shoulder internal rotation 4 4-  Shoulder external rotation 4 4-  Middle trapezius 4 4  Lower trapezius 4 3-  Elbow flexion 5 5  Elbow extension 5 5  Wrist flexion 5 5  Wrist extension 5 5  Wrist ulnar deviation  Wrist radial deviation      Wrist pronation      Wrist supination      Grip strength (lbs)      (Blank rows = not tested)   SHOULDER SPECIAL TESTS:            Impingement tests:  Painful Arch + L               SLAP lesions:  NT            Instability tests:  NT            Rotator cuff assessment:  Infraspinatus + L            Biceps assessment: Speed's Test: + L        JOINT MOBILITY TESTING:  Shoulder Right Inferior Glides Grade III-firm end feel  Shoulder Right AP Glides Grade III-IV - firm end feel    PALPATION:  TTP in anterior of left shoulder              TODAY'S TREATMENT:   04/07/22  UBE seat 9 with resistance level 3- 5 min  Shoulder Flexion/Extension AAROM with Pulleys 3 x 10  Shoulder Abduction/Adduction AAROM with Pulleys 3 x 10   AROM  Shoulder Flexion R/L 180/90* , Shoulder Abduction R/L 180/85*, Shoulder IR R/L 70/70*, Shoulder ER R/L 90/30*   PROM  Shoulder Flexion L 140, Abduction L 110, Shoulder L 45,    MMT Shoulder  Flex R/L 4+/4,Shoulder Abd R/L 4+/4+ , Shoulder IR R/L 4/4-, Shoulder ER R/L 4/4-, Lower Trap R/L 3+/3-, Mid Trap R/L 4/3+  Side Lying Left Shoulder Flexion 1 x 10 Side Lying Left Shoulder Flexion #1 DB 2 x 10  Side Lying Left Shoulder Abduction #1 DB 3 x 10     03/26/22 UBE seat level 9 with level 3 resistance  Shoulder Abduction/Adduction AAROM pulleys 3 x 10  -Pt able to reach 90 deg of abduction on left shoulder  Shoulder Flexion/Extension AAROM pulleys 3 x 10  Shoulder AROM Flexion/Extension to 90 deg 2 x 10  Shoulder AROM Abduction/Adduction -Left shoulder only able to abduct to 60 deg  MANUAL   Upper Trap myofacial release and massage   Upper Trap  Stretch  Cervical Traction  Left Shoulder Traction  Left Shoulder PROM Abduction to 120 degrees     03/24/22 Shoulder Flexion/Extension with Pulleys 3 x 10  Shoulder Abduction/Adduction with Pulleys 3 x 10  Shoulder AROM Flexion/Extension standing with berry colored ball on wall 3 x 10  Shoulder AROM Abduction/Extension standing with berry colored ball on wall 1 x 5 -Pt reports at a 8/10 NPS  Shoulder AROM Abduction/Adduction seated with Red Physioball 3 x 10  OMEGA Face Pulls  #5 2 x 5 Face Pulls with Yellow TB 2 x 5  Standing Shoulder Abduction with #1 DB 1 x 10  -Unable to perform without left upper trap compensation  Supine Shoulder Abduction #1 DB 2 x 10  -Pt able to perform to 90 deg abduction     03/17/22 UBE level 9 for 5 min (2.5 forward and 2.5 backward) OMEGA Seated Scapular Retraction #25 3 x 10  OMEGA Chest Press #10 3 x 10  OMEGA Lat Press #15 2 x 10  OMEGA Lat Press #20 1 x 10 Red Physioball AROM Flexion and Extension on table 3 x 10  Red Physioball AROM Abduction and Adduction on table 3 x 10    03/14/22  UBE Level 2  for 5 min (2.5 forward and 2.5 backward) Shoulder Pulleys AAROM Flexion/Extension 3 x 10  Shoulder Pulleys AAROM Abduction/Adduction 3 x 10  Shoulder AROM Flexon and Extension in standing 3 x 10  Shoulder AROM Abduction and Adduction in standing 3 x 10  Shoulder AROM Flexon and Extension in sitting 3 x 10  Shoulder AROM Abduction and Adduction in sitting 3 x 10  Lat Stretch on left side 3 x 30 sec             Wall Push Up with Plus 3 x 10  Scapular Rows with Black TB 2 x 10     PATIENT EDUCATION: Education details: form and technique for appropriate exercise and explanation of plan of care  Person educated: Patient Education method: Customer service manager and handout  Education comprehension: verbalized understanding, returned demonstration, and verbal cues required     HOME EXERCISE PROGRAM: Access Code: [email or web address] URL:  [email or web address] Date: 04/07/2022 Prepared by: Bradly Chris  Exercises - Seated Shoulder Flexion AAROM with Pulley Behind  - 1 x daily - 7 x weekly - 3 sets - 10 reps - Seated Shoulder Abduction AAROM with Pulley Behind  - 1 x daily - 7 x weekly - 3 sets - 10 reps - Wall Push Up with Plus  - 1 x daily - 3 x weekly - 3 sets - 10 reps - Seated Upper Trapezius Stretch  - 1 x daily - 7 x weekly - 1 sets - 3 reps - 60 hold - Standing Shoulder Row with Anchored Resistance  - 1 x daily - 3 x weekly - 3 sets - 10 reps - Latissimus Dorsi Stretch at Wall  - 1 x daily - 7 x weekly - 1 sets - 3 reps - 30 hold - Face Pulls  - 1 x daily - 3 x weekly - 3 sets - 5 reps - Sidelying Shoulder Flexion 15 Degrees  - 1 x daily - 3 x weekly - 3 sets - 10 reps - Sidelying Shoulder Abduction  - 1 x daily - 3 x weekly - 3 sets - 10 reps - Sidelying Shoulder External Rotation  - 1 x daily - 3 x weekly - 3 sets - 10 reps   ASSESSMENT:   CLINICAL IMPRESSION: Pt shows some improvement with left shoulder ROM and strength. However, he is still pain limited in shoulder flexion and abduction and exercises modified to perform shoulder flexion and abduction in side lying gravity eliminated position with increased ROM and without increasing his baseline pain. Pt shows improvement in parascapular strength with ability to perform side lying ER. He will continue to benefit from skilled PT to increase left shoulder ROM and strength to return to using left shoulder for overhead activities.     OBJECTIVE IMPAIRMENTS decreased ROM, decreased strength, hypomobility, impaired UE functional use, and pain.    ACTIVITY LIMITATIONS carrying, lifting, dressing, and reach over head   PARTICIPATION LIMITATIONS: cleaning, occupation, and yard work   PERSONAL FACTORS Fitness, Sex, and Time since onset of injury/illness/exacerbation are also affecting patient's functional outcome.    REHAB POTENTIAL: Good   CLINICAL  DECISION MAKING: Stable/uncomplicated   EVALUATION COMPLEXITY: Low     GOALS: Goals reviewed with patient? No   SHORT TERM GOALS: Target date: 03/21/2022     Pt will be independent with HEP in order to improve strength and balance in order to decrease fall risk and improve function at home and work. Baseline: Performing HEP independently  Goal  status: Partially Met      LONG TERM GOALS: Target date: 05/16/2022     Patient will have improved function and activity level as evidenced by an increase in FOTO score by 10 points or more.  Baseline: 50/65  04/07/22: 42/65 Goal status: ONGOING    2.  Patient will improve left shoulder ROM to be close to symmetrical to right shoulder to return to performing overhead activities to complete job related tasks and yard work.  Baseline: Shoulder Flexion R/L 80*/60* , Shoulder Abduction R/L 80*/60*, Shoulder IR R/L 70/50*, Shoulder ER R/L 90/70*  Goal status: ONGOING    3.  Patient will improve shoulder MMT by >=1/2 muscle grade for improved function to return to completing job related tasks and yard work.  Baseline: Shoulder  Flex R/L 4/3-,Shoulder Abd R/L 4/3- , Shoulder IR R/L 4/4-, Shoulder ER R/L 4/4-, Lower Trap R/L 4-/3-, Mid Trap R/L 4/4 Goal status: ONGOING      PLAN: PT FREQUENCY: 1-2x/week   PT DURATION: 10 weeks   PLANNED INTERVENTIONS: Therapeutic exercises, Therapeutic activity, Balance training, Patient/Family education, Joint manipulation, Joint mobilization, Aquatic Therapy, Dry Needling, Electrical stimulation, Spinal manipulation, Spinal mobilization, Cryotherapy, Moist heat, Manual therapy, and Re-evaluation   PLAN FOR NEXT SESSION:  Continue to progress shoulder AROM and parascapular strengthening exercises     Bradly Chris PT, DPT  04/07/2022, 11:10 AM

## 2022-04-11 ENCOUNTER — Other Ambulatory Visit: Payer: Self-pay

## 2022-04-12 ENCOUNTER — Ambulatory Visit: Payer: Medicaid Other | Attending: Cardiology | Admitting: Physical Therapy

## 2022-04-12 ENCOUNTER — Encounter: Payer: Self-pay | Admitting: Physical Therapy

## 2022-04-12 ENCOUNTER — Other Ambulatory Visit: Payer: Self-pay

## 2022-04-12 DIAGNOSIS — M25512 Pain in left shoulder: Secondary | ICD-10-CM | POA: Insufficient documentation

## 2022-04-12 NOTE — Therapy (Signed)
OUTPATIENT PHYSICAL THERAPY PROGRESS NOTE   Patient Name: Joshua Cole MRN: [id number] DOB:1961-05-30, 61 y.o., male Today's Date: 04/12/2022  PCP: No PCP   REFERRING PROVIDER: Dr. Quentin Ore   END OF SESSION:   PT End of Session - 04/12/22 0816     Visit Number 8    Number of Visits 20    Date for PT Re-Evaluation 05/16/22    Authorization Type [employer]    Progress Note Due on Visit 10    PT Start Time 0805    PT Stop Time 0845    PT Time Calculation (min) 40 min    Activity Tolerance Patient tolerated treatment well    Behavior During Therapy [facility] for tasks assessed/performed              Past Medical History:  Diagnosis Date   Arthritis    Asthma    Diabetes mellitus without complication ([place])    type 2   DJD (degenerative joint disease)    Dysrhythmia    junctional tachycardia and incomplete heart block   Gout    Lung nodules    a. 09/2021 CT chest: Interval decrease in size and number of bilateral lung nodules, likely consistent with sequelae associated with an infectious/inflammatory process.   MSSA bacteremia 06/2021   Rotator cuff tear 08/26/2021   Subacute bacterial endocarditis (SBE)    a. 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   Past Surgical History:  Procedure Laterality Date   ACHILLES TENDON REPAIR Left    many years ago per pt   BIOPSY  07/16/2021   Procedure: BIOPSY;  Surgeon: DSharyn Creamer MD;  [facility]  Service: Gastroenterology;;  EGD and COLON   COLONOSCOPY N/A 07/16/2021   Procedure: COLONOSCOPY;  Surgeon: DSharyn Creamer MD;  [place]  Service: Gastroenterology;  Laterality: N/A;   COLONOSCOPY WITH PROPOFOL N/A 07/16/2021   Procedure: COLONOSCOPY WITH PROPOFOL;  Surgeon: DSharyn Creamer MD;  [place] [facility]  Service: Gastroenterology;  Laterality: N/A;   ESOPHAGOGASTRODUODENOSCOPY (EGD) WITH PROPOFOL N/A 07/16/2021   Procedure: ESOPHAGOGASTRODUODENOSCOPY (EGD) WITH PROPOFOL;  Surgeon: DSharyn Creamer MD;  [place]  Service: Gastroenterology;  Laterality: N/A;   FOOT SURGERY Right    ligaments repaired- many years ago per pt   JOINT REPLACEMENT Bilateral    hip   POLYPECTOMY  07/16/2021   Procedure: POLYPECTOMY;  Surgeon: DSharyn Creamer MD;  [facility];  Service: Gastroenterology;;   SHOULDER ARTHROSCOPY WITH ROTATOR CUFF REPAIR AND SUBACROMIAL DECOMPRESSION Left 02/10/2022   Procedure: LEFT SHOULDER ARTHROSCOPY WITH EXTENSIVE DEBRIDEMENT, SUBACROMIAL DECOMPRESSION;  Surgeon: BMcarthur Rossetti MD;  [place]  Service: Orthopedics;  Laterality: Left;   TEE WITHOUT CARDIOVERSION N/A 07/07/2021   Procedure: TRANSESOPHAGEAL ECHOCARDIOGRAM (TEE);  Surgeon: AKate Sable MD;  Location: [facility];  Service: Cardiovascular;  Laterality: N/A;   Patient Active Problem List   Diagnosis Date Noted   Acid reflux 03/31/2022   Left hip pain 03/31/2022   Complete tear of left rotator cuff 02/10/2022   Headache 11/02/2021   Peripheral neuropathy 10/05/2021   Acute gout 08/26/2021   Rotator cuff tear 08/26/2021   Encounter to establish care 08/25/2021   Type 2 diabetes mellitus without complications ([facility] 175/30/0511  History of gout 08/25/2021   Left shoulder pain  08/25/2021   Duodenal ulcer    History of colonic polyps    Acute blood loss anemia    Heart block AV complete (HCC)    Abnormal LFTs    MSSA bacteremia 07/08/2021   Acute bacterial endocarditis    Heart block    Bacteremia    Hyponatremia 01/60/1093   Acute metabolic encephalopathy 23/55/7322   Joint pain 07/03/2021   Generalized weakness 07/03/2021   SIRS (systemic inflammatory response syndrome) ([place]) 07/03/2021   Hypokalemia 07/03/2021   Hyperglycemia 07/03/2021    Thrombocytopenia ([place]) 07/03/2021   Nausea vomiting and diarrhea 07/03/2021   Gout     REFERRING DIAG: Z98.890 (ICD-10-CM) - Status post arthroscopy of left shoulder   THERAPY DIAG:  Acute pain of left shoulder  Rationale for Evaluation and Treatment Rehabilitation  PERTINENT HISTORY: Per note from Dr. [name] on 02/17/22   The patient is following up at 1 week status post a left shoulder arthroscopy with subacromial decompression and extensive debridement.  We found severe synovitis in the shoulder but no rotator cuff tear.  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   I did share with him the arthroscopy pictures showing him the amount of synovitis in his shoulder.  It is essential we get him set up for outpatient physical therapy to work on any modalities that can get his shoulder moving better and decrease his pain.  Incisions look good.  The sutures have been removed.   We will see him back in 4 weeks to see how he is doing overall.  Apparently has a remote history of hip replacements and he needs to have these evaluated.  At his next visit awe will have a AP pelvis and lateral both hips.  PRECAUTIONS: None   SUBJECTIVE: Pt reports that his shoulder has been feeling better as of late. However, he did feel increased pain on Sunday, so he held of on doing exercises.  PAIN:  Are you having pain? Yes: NPRS scale: 5/10 Pain location: Left anterior shoulder Pain description: Achy Aggravating factors: moving shoulder overhead  Relieving factors: nothing really helps          VITALS: BP 156/96  HR 97 SpO2 100    DIAGNOSTIC FINDINGS:  CLINICAL DATA:  Left shoulder pain   EXAM: MRI OF THE LEFT SHOULDER WITHOUT AND WITH CONTRAST   TECHNIQUE: Multiplanar, multisequence MR imaging of the left shoulder was performed  before and after the administration of intravenous contrast.   CONTRAST:  55m GADAVIST GADOBUTROL 1 MMOL/ML IV SOLN   COMPARISON:  Left shoulder radiograph 07/12/2021   FINDINGS: Rotator cuff: There is a full-thickness, near full width tear of the supraspinatus tendon at the footprint (coronal T2 image 12). There is high-grade articular sided tearing of the anterior fibers of the infraspinatus tendon at the footprint (sagittal T2 image 4-6). Mild tendinosis of the cephalad fibers of the subscapularis tendon. The teres minor is intact.   Muscles: Intramuscular edema within the infraspinatus and deltoid muscles. No significant muscle atrophy.   Biceps Long Head: Intact intra and extra-articular long head biceps tendon.   Acromioclavicular Joint: Moderate-severe arthropathy of the acromioclavicular joint. There is distension of the subacromial-subdeltoid bursa with thick peripheral enhancement, short axis measuring up to 0.9 cm. (Coronal T1 post-contrast image 12, sagittal post image 5).   Glenohumeral Joint:  Trace glenohumeral joint fluid. Mild chondrosis.   Labrum: Degenerative superior labral tearing extending anteriorly and posteriorly through the biceps labral anchor.   Bones: No fracture or dislocation. No aggressive osseous lesion. No convincing marrow signal changes to suggest osteomyelitis. There is low T1 signal within the scapula, acromion, clavicle, nonspecific.   Other: No fluid collection or hematoma.   IMPRESSION: Full-thickness, near full width tear of the supraspinatus tendon at the footprint. High-grade articular sided tearing of the anterior fibers of the infraspinatus tendon at the footprint. Mild subscapularis tendinosis of the cephalad fibers. No significant muscle atrophy.   Distension of the subacromial-subdeltoid bursa with a peripheral enhancement, short axis measuring up to 0.9 cm. While this could be related to the patient's rotator cuff tear,  given adjacent reactive edema in the deltoid and clinical concern for infection, bursa aspiration should be considered.   Minimal there is minimal glenohumeral joint fluid and without adjacent marrow signal abnormality to suggest osteomyelitis. Low T1 signal within the scapula, acromion and clavicle is nonspecific, possibly related to the patient's anemia.   Degenerative superior labral tearing anteriorly and posteriorly through the biceps labral anchor.   Moderate-severe AC joint arthropathy.     Electronically Signed   By: Maurine Simmering M.D.   On: 07/14/2021 16:59   PATIENT SURVEYS:  FOTO 50/65   COGNITION:           Overall cognitive status: Within functional limits for tasks assessed                                  SENSATION: WFL   POSTURE: Slight round in shoulders    UPPER EXTREMITY ROM:         Active ROM Right eval Left eval  Shoulder flexion 80 60*  Shoulder extension 60 60  Shoulder abduction 80 60*  Shoulder adduction      Shoulder internal rotation 70 50*  Shoulder external rotation 90 70*  Elbow flexion 150 150  Elbow extension 60 60  Wrist flexion 80 80  Wrist extension 70 70  Wrist ulnar deviation 30 30  Wrist radial deviation 20 20  Wrist pronation      Wrist supination      (Blank rows = not tested)      Passive ROM Right eval Left eval  Shoulder flexion 160 180  Shoulder extension 60 60  Shoulder abduction 180  180  Shoulder adduction      Shoulder internal rotation 70 70  Shoulder external rotation 90 90  Elbow flexion 150 150  Elbow extension 60 60  Wrist flexion 80 80  Wrist extension 70 70  Wrist ulnar deviation 30 30  Wrist radial deviation 20 20  Wrist pronation      Wrist supination      (Blank rows = not tested)              UPPER EXTREMITY MMT:   MMT Right eval Left eval  Shoulder flexion 4 3-  Shoulder extension 5 5  Shoulder abduction 4 3-  Shoulder adduction      Shoulder internal rotation 4 4-   Shoulder external rotation 4 4-  Middle trapezius 4 4  Lower trapezius 4 3-  Elbow flexion 5 5  Elbow extension 5 5  Wrist flexion 5 5  Wrist extension 5 5  Wrist ulnar deviation      Wrist radial deviation  Wrist pronation      Wrist supination      Grip strength (lbs)      (Blank rows = not tested)   SHOULDER SPECIAL TESTS:            Impingement tests:  Painful Arch + L               SLAP lesions:  NT            Instability tests:  NT            Rotator cuff assessment:  Infraspinatus + L            Biceps assessment: Speed's Test: + L        JOINT MOBILITY TESTING:  Shoulder Right Inferior Glides Grade III-firm end feel  Shoulder Right AP Glides Grade III-IV - firm end feel    PALPATION:  TTP in anterior of left shoulder              TODAY'S TREATMENT:   04/12/22 UBE seat 9 with resistance level 3- 5 min  Shoulder AAROM Flexion/Extension 3 x 10  Shoulder AAROM Abduction/Adduction 3 x 10  Supine Shoulder AAROM Flexion/Extension #10 AW   Supine Shoulder AAROM Scaption Plane Flexion/Extension 3 x 10    Seated Shoulder Horizontal Abduction 3 x 10    04/07/22  UBE seat 9 with resistance level 3- 5 min  Shoulder Flexion/Extension AAROM with Pulleys 3 x 10  Shoulder Abduction/Adduction AAROM with Pulleys 3 x 10   AROM  Shoulder Flexion R/L 180/90* , Shoulder Abduction R/L 180/85*, Shoulder IR R/L 70/70*, Shoulder ER R/L 90/30*   PROM  Shoulder Flexion L 140, Abduction L 110, Shoulder L 45,    MMT Shoulder  Flex R/L 4+/4,Shoulder Abd R/L 4+/4+ , Shoulder IR R/L 4/4-, Shoulder ER R/L 4/4-, Lower Trap R/L 3+/3-, Mid Trap R/L 4/3+  Side Lying Left Shoulder Flexion 1 x 10 Side Lying Left Shoulder Flexion #1 DB 2 x 10  Side Lying Left Shoulder Abduction #1 DB 3 x 10     03/26/22 UBE seat level 9 with level 3 resistance  Shoulder Abduction/Adduction AAROM pulleys 3 x 10  -Pt able to reach 90 deg of abduction on left shoulder  Shoulder Flexion/Extension AAROM  pulleys 3 x 10  Shoulder AROM Flexion/Extension to 90 deg 2 x 10  Shoulder AROM Abduction/Adduction -Left shoulder only able to abduct to 60 deg  MANUAL   Upper Trap myofacial release and massage   Upper Trap Stretch  Cervical Traction  Left Shoulder Traction  Left Shoulder PROM Abduction to 120 degrees     03/24/22 Shoulder Flexion/Extension with Pulleys 3 x 10  Shoulder Abduction/Adduction with Pulleys 3 x 10  Shoulder AROM Flexion/Extension standing with berry colored ball on wall 3 x 10  Shoulder AROM Abduction/Extension standing with berry colored ball on wall 1 x 5 -Pt reports at a 8/10 NPS  Shoulder AROM Abduction/Adduction seated with Red Physioball 3 x 10  OMEGA Face Pulls  #5 2 x 5 Face Pulls with Yellow TB 2 x 5  Standing Shoulder Abduction with #1 DB 1 x 10  -Unable to perform without left upper trap compensation  Supine Shoulder Abduction #1 DB 2 x 10  -Pt able to perform to 90 deg abduction     03/17/22 UBE level 9 for 5 min (2.5 forward and 2.5 backward) OMEGA Seated Scapular Retraction #25 3 x 10  OMEGA Chest Press #10 3 x 10  OMEGA Lat Press #15 2 x 10  OMEGA Lat Press #20 1 x 10 Red Physioball AROM Flexion and Extension on table 3 x 10  Red Physioball AROM Abduction and Adduction on table 3 x 10    03/14/22  UBE Level 2 for 5 min (2.5 forward and 2.5 backward) Shoulder Pulleys AAROM Flexion/Extension 3 x 10  Shoulder Pulleys AAROM Abduction/Adduction 3 x 10  Shoulder AROM Flexon and Extension in standing 3 x 10  Shoulder AROM Abduction and Adduction in standing 3 x 10  Shoulder AROM Flexon and Extension in sitting 3 x 10  Shoulder AROM Abduction and Adduction in sitting 3 x 10  Lat Stretch on left side 3 x 30 sec             Wall Push Up with Plus 3 x 10  Scapular Rows with Black TB 2 x 10     PATIENT EDUCATION: Education details: form and technique for appropriate exercise and explanation of plan of care  Person educated: Patient Education  method: Customer service manager and handout  Education comprehension: verbalized understanding, returned demonstration, and verbal cues required     HOME EXERCISE PROGRAM: Access Code: [email or web address] URL: [email or web address] Date: 04/12/2022 Prepared by: Bradly Chris  Exercises - Seated Shoulder Flexion AAROM with Pulley Behind  - 1 x daily - 7 x weekly - 3 sets - 10 reps - Seated Shoulder Abduction AAROM with Pulley Behind  - 1 x daily - 7 x weekly - 3 sets - 10 reps - Supine Shoulder Flexion Extension AAROM with Dowel  - 1 x daily - 7 x weekly - 3 sets - 10 reps - Shoulder Single Arm Flexion AAROM  - 1 x daily - 7 x weekly - 3 sets - 10 reps - Sidelying Shoulder Abduction  - 1 x daily - 3 x weekly - 3 sets - 10 reps - Wall Push Up with Plus  - 1 x daily - 3 x weekly - 3 sets - 10 reps - Seated Upper Trapezius Stretch  - 1 x daily - 7 x weekly - 1 sets - 3 reps - 60 hold - Standing Shoulder Row with Anchored Resistance  - 1 x daily - 3 x weekly - 3 sets - 10 reps - Latissimus Dorsi Stretch at Wall  - 1 x daily - 7 x weekly - 1 sets - 3 reps - 30 hold - Face Pulls  - 1 x daily - 3 x weekly - 3 sets - 5 reps - Sidelying Shoulder Flexion 15 Degrees  - 1 x daily - 3 x weekly - 3 sets - 10 reps - Sidelying Shoulder External Rotation  - 1 x daily - 3 x weekly - 3 sets - 10 reps - Standing Shoulder Horizontal Abduction with Resistance  - 1 x daily - 3 x weekly - 3 sets - 10 reps   ASSESSMENT:   CLINICAL IMPRESSION: Pt continues to show decreased left shoulder flexion and abduction ROM with increased pain beginning at 90 degrees. This has been happening for the past several sessions. HEP modified to include an increase in the amount of mobility exercises to improve shoulder mobility. He is nearing the end of his POC and he will be re-evaluated in two sessions. He will continue to benefit from skilled PT to increase left shoulder ROM and strength to return to using left shoulder  for overhead activities.     OBJECTIVE IMPAIRMENTS decreased ROM, decreased strength, hypomobility, impaired UE functional  use, and pain.    ACTIVITY LIMITATIONS carrying, lifting, dressing, and reach over head   PARTICIPATION LIMITATIONS: cleaning, occupation, and yard work   PERSONAL FACTORS Fitness, Sex, and Time since onset of injury/illness/exacerbation are also affecting patient's functional outcome.    REHAB POTENTIAL: Good   CLINICAL DECISION MAKING: Stable/uncomplicated   EVALUATION COMPLEXITY: Low     GOALS: Goals reviewed with patient? No   SHORT TERM GOALS: Target date: 03/21/2022     Pt will be independent with HEP in order to improve strength and balance in order to decrease fall risk and improve function at home and work. Baseline: Performing HEP independently  Goal status: Partially Met      LONG TERM GOALS: Target date: 05/16/2022     Patient will have improved function and activity level as evidenced by an increase in FOTO score by 10 points or more.  Baseline: 50/65  04/07/22: 42/65 Goal status: ONGOING    2.  Patient will improve left shoulder ROM to be close to symmetrical to right shoulder to return to performing overhead activities to complete job related tasks and yard work.  Baseline: Shoulder Flexion R/L 80*/60* , Shoulder Abduction R/L 80*/60*, Shoulder IR R/L 70/50*, Shoulder ER R/L 90/70*  Goal status: ONGOING    3.  Patient will improve shoulder MMT by >=1/2 muscle grade for improved function to return to completing job related tasks and yard work.  Baseline: Shoulder  Flex R/L 4/3-,Shoulder Abd R/L 4/3- , Shoulder IR R/L 4/4-, Shoulder ER R/L 4/4-, Lower Trap R/L 4-/3-, Mid Trap R/L 4/4 Goal status: ONGOING      PLAN: PT FREQUENCY: 1-2x/week   PT DURATION: 10 weeks   PLANNED INTERVENTIONS: Therapeutic exercises, Therapeutic activity, Balance training, Patient/Family education, Joint manipulation, Joint mobilization, Aquatic Therapy, Dry  Needling, Electrical stimulation, Spinal manipulation, Spinal mobilization, Cryotherapy, Moist heat, Manual therapy, and Re-evaluation   PLAN FOR NEXT SESSION:  Continue to progress shoulder AROM and parascapular strengthening exercises     Bradly Chris PT, DPT  04/12/2022, 8:17 AM

## 2022-04-14 ENCOUNTER — Other Ambulatory Visit: Payer: Self-pay

## 2022-04-15 ENCOUNTER — Other Ambulatory Visit: Payer: Self-pay

## 2022-04-19 ENCOUNTER — Encounter: Payer: Self-pay | Admitting: Physical Therapy

## 2022-04-19 ENCOUNTER — Ambulatory Visit: Payer: Medicaid Other | Admitting: Physical Therapy

## 2022-04-19 DIAGNOSIS — M25512 Pain in left shoulder: Secondary | ICD-10-CM | POA: Diagnosis not present

## 2022-04-19 NOTE — Therapy (Signed)
OUTPATIENT PHYSICAL THERAPY TREATMENT NOTE   Patient Name: Joshua Cole MRN: [id number] DOB:Nov 22, 1960, 61 y.o., male Today's Date: 04/19/2022  PCP: No PCP   REFERRING PROVIDER: Dr. Quentin Ore   END OF SESSION:   PT End of Session - 04/19/22 1141     Visit Number 9    Number of Visits 20    Date for PT Re-Evaluation 05/16/22    Authorization Type [place] [employer]    Progress Note Due on Visit 10    PT Start Time 1140    PT Stop Time 1220    PT Time Calculation (min) 40 min    Activity Tolerance Patient tolerated treatment well    Behavior During Therapy WFL for tasks assessed/performed              Past Medical History:  Diagnosis Date   Arthritis    Asthma    Diabetes mellitus without complication ([place])    type 2   DJD (degenerative joint disease)    Dysrhythmia    junctional tachycardia and incomplete heart block   Gout    Lung nodules    a. 09/2021 CT chest: Interval decrease in size and number of bilateral lung nodules, likely consistent with sequelae associated with an infectious/inflammatory process.   MSSA bacteremia 06/2021   Rotator cuff tear 08/26/2021   Subacute bacterial endocarditis (SBE)    a. 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   Past Surgical History:  Procedure Laterality Date   ACHILLES TENDON REPAIR Left    many years ago per pt   BIOPSY  07/16/2021   Procedure: BIOPSY;  Surgeon: Sharyn Creamer, MD;  Location: [facility] ENDOSCOPY;  Service: Gastroenterology;;  EGD and COLON   COLONOSCOPY N/A 07/16/2021   Procedure: COLONOSCOPY;  Surgeon: Sharyn Creamer, MD;  Location: [place];  Service: Gastroenterology;  Laterality: N/A;   COLONOSCOPY WITH PROPOFOL N/A 07/16/2021   Procedure: COLONOSCOPY WITH PROPOFOL;  Surgeon: Sharyn Creamer, MD;  Location: [place];  Service: Gastroenterology;  Laterality: N/A;   ESOPHAGOGASTRODUODENOSCOPY (EGD) WITH PROPOFOL N/A 07/16/2021   Procedure: ESOPHAGOGASTRODUODENOSCOPY (EGD) WITH PROPOFOL;  Surgeon: Sharyn Creamer, MD;  Location: [place];  Service: Gastroenterology;  Laterality: N/A;   FOOT SURGERY Right    ligaments repaired- many years ago per pt   JOINT REPLACEMENT Bilateral    hip   POLYPECTOMY  07/16/2021   Procedure: POLYPECTOMY;  Surgeon: Sharyn Creamer, MD;  [facility] ENDOSCOPY;  Service: Gastroenterology;;   SHOULDER ARTHROSCOPY WITH ROTATOR CUFF REPAIR AND SUBACROMIAL DECOMPRESSION Left 02/10/2022   Procedure: LEFT SHOULDER ARTHROSCOPY WITH EXTENSIVE DEBRIDEMENT, SUBACROMIAL DECOMPRESSION;  Surgeon: Mcarthur Rossetti, MD;  Location: [place];  Service: Orthopedics;  Laterality: Left;   TEE WITHOUT CARDIOVERSION N/A 07/07/2021   Procedure: TRANSESOPHAGEAL ECHOCARDIOGRAM (TEE);  Surgeon: Kate Sable, MD;  Location: [facility];  Service: Cardiovascular;  Laterality: N/A;   Patient Active Problem List   Diagnosis Date Noted   Acid reflux 03/31/2022   Left hip pain 03/31/2022   Complete tear of left rotator cuff 02/10/2022   Headache 11/02/2021   Peripheral neuropathy 10/05/2021   Acute gout 08/26/2021   Rotator cuff tear 08/26/2021   Encounter to establish care 08/25/2021   Type 2 diabetes mellitus without complications ([place]) 63/09/6008   History of gout 08/25/2021   Left shoulder pain  08/25/2021   Duodenal ulcer    History of colonic polyps    Acute blood loss anemia    Heart block AV complete (HCC)    Abnormal LFTs    MSSA bacteremia 07/08/2021   Acute bacterial endocarditis    Heart block    Bacteremia    Hyponatremia 80/32/1224   Acute metabolic encephalopathy 82/50/0370   Joint pain 07/03/2021   Generalized weakness 07/03/2021   SIRS (systemic inflammatory response syndrome) ([place]) 07/03/2021   Hypokalemia 07/03/2021   Hyperglycemia 07/03/2021    Thrombocytopenia ([place]) 07/03/2021   Nausea vomiting and diarrhea 07/03/2021   Gout     REFERRING DIAG: Z98.890 (ICD-10-CM) - Status post arthroscopy of left shoulder   THERAPY DIAG:  Acute pain of left shoulder  Rationale for Evaluation and Treatment Rehabilitation  PERTINENT HISTORY: Per note from Dr. [name] on 02/17/22   The patient is following up at 1 week status post a left shoulder arthroscopy with subacromial decompression and extensive debridement.  We found severe synovitis in the shoulder but no rotator cuff tear.  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   I did share with him the arthroscopy pictures showing him the amount of synovitis in his shoulder.  It is essential we get him set up for outpatient physical therapy to work on any modalities that can get his shoulder moving better and decrease his pain.  Incisions look good.  The sutures have been removed.   We will see him back in 4 weeks to see how he is doing overall.  Apparently has a remote history of hip replacements and he needs to have these evaluated.  At his next visit awe will have a AP pelvis and lateral both hips.  PRECAUTIONS: None   SUBJECTIVE: Pt reports that he feels shoulder continues to improve with increased PROM with shoulder pulleys. He still continues to feel pain with overhead movement and he is limited when reaching for objects.   PAIN:  Are you having pain? Yes: NPRS scale: 2-3/10 Pain location: Left anterior shoulder Pain description: Achy Aggravating factors: moving shoulder overhead  Relieving factors: nothing really helps          VITALS: BP 156/96  HR 97 SpO2 100    DIAGNOSTIC FINDINGS:  CLINICAL DATA:  Left shoulder pain   EXAM: MRI OF THE LEFT SHOULDER WITHOUT AND WITH CONTRAST   TECHNIQUE: Multiplanar,  multisequence MR imaging of the left shoulder was performed before and after the administration of intravenous contrast.   CONTRAST:  42m GADAVIST GADOBUTROL 1 MMOL/ML IV SOLN   COMPARISON:  Left shoulder radiograph 07/12/2021   FINDINGS: Rotator cuff: There is a full-thickness, near full width tear of the supraspinatus tendon at the footprint (coronal T2 image 12). There is high-grade articular sided tearing of the anterior fibers of the infraspinatus tendon at the footprint (sagittal T2 image 4-6). Mild tendinosis of the cephalad fibers of the subscapularis tendon. The teres minor is intact.   Muscles: Intramuscular edema within the infraspinatus and deltoid muscles. No significant muscle atrophy.   Biceps Long Head: Intact intra and extra-articular long head biceps tendon.   Acromioclavicular Joint: Moderate-severe arthropathy of the acromioclavicular joint. There is distension of the subacromial-subdeltoid bursa with thick peripheral enhancement, short axis measuring up to 0.9 cm. (Coronal T1 post-contrast image 12, sagittal post  image 5).   Glenohumeral Joint: Trace glenohumeral joint fluid. Mild chondrosis.   Labrum: Degenerative superior labral tearing extending anteriorly and posteriorly through the biceps labral anchor.   Bones: No fracture or dislocation. No aggressive osseous lesion. No convincing marrow signal changes to suggest osteomyelitis. There is low T1 signal within the scapula, acromion, clavicle, nonspecific.   Other: No fluid collection or hematoma.   IMPRESSION: Full-thickness, near full width tear of the supraspinatus tendon at the footprint. High-grade articular sided tearing of the anterior fibers of the infraspinatus tendon at the footprint. Mild subscapularis tendinosis of the cephalad fibers. No significant muscle atrophy.   Distension of the subacromial-subdeltoid bursa with a peripheral enhancement, short axis measuring up to 0.9 cm.  While this could be related to the patient's rotator cuff tear, given adjacent reactive edema in the deltoid and clinical concern for infection, bursa aspiration should be considered.   Minimal there is minimal glenohumeral joint fluid and without adjacent marrow signal abnormality to suggest osteomyelitis. Low T1 signal within the scapula, acromion and clavicle is nonspecific, possibly related to the patient's anemia.   Degenerative superior labral tearing anteriorly and posteriorly through the biceps labral anchor.   Moderate-severe AC joint arthropathy.     Electronically Signed   By: Maurine Simmering M.D.   On: 07/14/2021 16:59   PATIENT SURVEYS:  FOTO 50/65   COGNITION:           Overall cognitive status: Within functional limits for tasks assessed                                  SENSATION: WFL   POSTURE: Slight round in shoulders    UPPER EXTREMITY ROM:         Active ROM Right eval Left eval  Shoulder flexion 80 60*  Shoulder extension 60 60  Shoulder abduction 80 60*  Shoulder adduction      Shoulder internal rotation 70 50*  Shoulder external rotation 90 70*  Elbow flexion 150 150  Elbow extension 60 60  Wrist flexion 80 80  Wrist extension 70 70  Wrist ulnar deviation 30 30  Wrist radial deviation 20 20  Wrist pronation      Wrist supination      (Blank rows = not tested)      Passive ROM Right eval Left eval  Shoulder flexion 160 180  Shoulder extension 60 60  Shoulder abduction 180  180  Shoulder adduction      Shoulder internal rotation 70 70  Shoulder external rotation 90 90  Elbow flexion 150 150  Elbow extension 60 60  Wrist flexion 80 80  Wrist extension 70 70  Wrist ulnar deviation 30 30  Wrist radial deviation 20 20  Wrist pronation      Wrist supination      (Blank rows = not tested)              UPPER EXTREMITY MMT:   MMT Right eval Left eval  Shoulder flexion 4 3-  Shoulder extension 5 5  Shoulder abduction 4 3-   Shoulder adduction      Shoulder internal rotation 4 4-  Shoulder external rotation 4 4-  Middle trapezius 4 4  Lower trapezius 4 3-  Elbow flexion 5 5  Elbow extension 5 5  Wrist flexion 5 5  Wrist extension 5 5  Wrist ulnar deviation      Wrist  radial deviation      Wrist pronation      Wrist supination      Grip strength (lbs)      (Blank rows = not tested)   SHOULDER SPECIAL TESTS:            Impingement tests:  Painful Arch + L               SLAP lesions:  NT            Instability tests:  NT            Rotator cuff assessment:  Infraspinatus + L            Biceps assessment: Speed's Test: + L        JOINT MOBILITY TESTING:  Shoulder Right Inferior Glides Grade III-firm end feel  Shoulder Right AP Glides Grade III-IV - firm end feel    PALPATION:  TTP in anterior of left shoulder              TODAY'S TREATMENT:   04/19/22 UBE seat 9 with resistance level 3- 5 min  Shoulder AAROM Flex/Ext Pulleys 3 x 10  Shoulder AAROM Abd/Add Pulleys 3 x 10 Shoulder AROM Abd 3 x 10  Shoulder AROM Flex 1 x 10  Shoulder Flex with #1 DB 3 x 10  Shoulder Rows with Gray Band 3 x 10  Face Pulls with Red Band 3 x 10    04/12/22 UBE seat 9 with resistance level 3- 5 min  Shoulder AAROM Flexion/Extension 3 x 10  Shoulder AAROM Abduction/Adduction 3 x 10  Supine Shoulder AAROM Flexion/Extension #10 AW   Supine Shoulder AAROM Scaption Plane Flexion/Extension 3 x 10    Seated Shoulder Horizontal Abduction 3 x 10    04/07/22  UBE seat 9 with resistance level 3- 5 min  Shoulder Flexion/Extension AAROM with Pulleys 3 x 10  Shoulder Abduction/Adduction AAROM with Pulleys 3 x 10   AROM  Shoulder Flexion R/L 180/90* , Shoulder Abduction R/L 180/85*, Shoulder IR R/L 70/70*, Shoulder ER R/L 90/30*   PROM  Shoulder Flexion L 140, Abduction L 110, Shoulder L 45,    MMT Shoulder  Flex R/L 4+/4,Shoulder Abd R/L 4+/4+ , Shoulder IR R/L 4/4-, Shoulder ER R/L 4/4-, Lower Trap R/L 3+/3-, Mid  Trap R/L 4/3+  Side Lying Left Shoulder Flexion 1 x 10 Side Lying Left Shoulder Flexion #1 DB 2 x 10  Side Lying Left Shoulder Abduction #1 DB 3 x 10     03/26/22 UBE seat level 9 with level 3 resistance  Shoulder Abduction/Adduction AAROM pulleys 3 x 10  -Pt able to reach 90 deg of abduction on left shoulder  Shoulder Flexion/Extension AAROM pulleys 3 x 10  Shoulder AROM Flexion/Extension to 90 deg 2 x 10  Shoulder AROM Abduction/Adduction -Left shoulder only able to abduct to 60 deg  MANUAL   Upper Trap myofacial release and massage   Upper Trap Stretch  Cervical Traction  Left Shoulder Traction  Left Shoulder PROM Abduction to 120 degrees     03/24/22 Shoulder Flexion/Extension with Pulleys 3 x 10  Shoulder Abduction/Adduction with Pulleys 3 x 10  Shoulder AROM Flexion/Extension standing with berry colored ball on wall 3 x 10  Shoulder AROM Abduction/Extension standing with berry colored ball on wall 1 x 5 -Pt reports at a 8/10 NPS  Shoulder AROM Abduction/Adduction seated with Red Physioball 3 x 10  OMEGA Face Pulls  #5 2 x 5 Face  Pulls with Yellow TB 2 x 5  Standing Shoulder Abduction with #1 DB 1 x 10  -Unable to perform without left upper trap compensation  Supine Shoulder Abduction #1 DB 2 x 10  -Pt able to perform to 90 deg abduction     PATIENT EDUCATION: Education details: form and technique for appropriate exercise and explanation of plan of care  Person educated: Patient Education method: Customer service manager and handout  Education comprehension: verbalized understanding, returned demonstration, and verbal cues required     HOME EXERCISE PROGRAM: Access Code: [email or web address] URL: [email or web address] Date: 04/19/2022 Prepared by: Bradly Chris  Exercises - Seated Shoulder Flexion AAROM with Pulley Behind  - 1 x daily - 7 x weekly - 3 sets - 10 reps - Seated Shoulder Abduction AAROM with Pulley Behind  - 1 x daily - 7 x weekly - 3  sets - 10 reps - Supine Shoulder Flexion Extension AAROM with Dowel  - 1 x daily - 7 x weekly - 3 sets - 10 reps - Shoulder Single Arm Flexion AAROM  - 1 x daily - 7 x weekly - 3 sets - 10 reps - Wall Push Up with Plus  - 1 x daily - 3 x weekly - 3 sets - 10 reps - Seated Upper Trapezius Stretch  - 1 x daily - 7 x weekly - 1 sets - 3 reps - 60 hold - Standing Shoulder Row with Anchored Resistance  - 1 x daily - 3 x weekly - 3 sets - 10 reps - Latissimus Dorsi Stretch at Wall  - 1 x daily - 7 x weekly - 1 sets - 3 reps - 30 hold - Face Pulls  - 1 x daily - 3 x weekly - 3 sets - 5 reps - Sidelying Shoulder External Rotation  - 1 x daily - 3 x weekly - 3 sets - 10 reps - Standing Shoulder Horizontal Abduction with Resistance  - 1 x daily - 3 x weekly - 3 sets - 10 reps - Standing Shoulder Flexion to 90 Degrees with Dumbbells  - 1 x daily - 3 x weekly - 3 sets - 10 reps - Shoulder Abduction with Dumbbells - Thumbs Up  - 1 x daily - 3 x weekly - 3 sets - 10 reps  ASSESSMENT:   CLINICAL IMPRESSION:  Pt shows an improvement in shoulder AROM with ability to tolerate full set of reps for flexion and abduction without an increase in pain. He also exhibits an improvement with parascapular strength with ability to perform seated rows and face pulls with increased resistance. He still shows limited shoulder AROM with pain when reaching overhead that has not progressed over the past several appointments.  He will continue to benefit from skilled PT to increase left shoulder ROM and strength to return to using left shoulder for overhead activities.   OBJECTIVE IMPAIRMENTS decreased ROM, decreased strength, hypomobility, impaired UE functional use, and pain.    ACTIVITY LIMITATIONS carrying, lifting, dressing, and reach over head   PARTICIPATION LIMITATIONS: cleaning, occupation, and yard work   PERSONAL FACTORS Fitness, Sex, and Time since onset of injury/illness/exacerbation are also affecting patient's  functional outcome.    REHAB POTENTIAL: Good   CLINICAL DECISION MAKING: Stable/uncomplicated   EVALUATION COMPLEXITY: Low     GOALS: Goals reviewed with patient? No   SHORT TERM GOALS: Target date: 03/21/2022     Pt will be independent with HEP in order to improve strength and balance in order to  decrease fall risk and improve function at home and work. Baseline: Performing HEP independently  Goal status: Partially Met      LONG TERM GOALS: Target date: 05/16/2022     Patient will have improved function and activity level as evidenced by an increase in FOTO score by 10 points or more.  Baseline: 50/65  04/07/22: 42/65 Goal status: ONGOING    2.  Patient will improve left shoulder ROM to be close to symmetrical to right shoulder to return to performing overhead activities to complete job related tasks and yard work.  Baseline: Shoulder Flexion R/L 80*/60* , Shoulder Abduction R/L 80*/60*, Shoulder IR R/L 70/50*, Shoulder ER R/L 90/70*  Goal status: ONGOING    3.  Patient will improve shoulder MMT by >=1/2 muscle grade for improved function to return to completing job related tasks and yard work.  Baseline: Shoulder  Flex R/L 4/3-,Shoulder Abd R/L 4/3- , Shoulder IR R/L 4/4-, Shoulder ER R/L 4/4-, Lower Trap R/L 4-/3-, Mid Trap R/L 4/4 Goal status: ONGOING      PLAN: PT FREQUENCY: 1-2x/week   PT DURATION: 10 weeks   PLANNED INTERVENTIONS: Therapeutic exercises, Therapeutic activity, Balance training, Patient/Family education, Joint manipulation, Joint mobilization, Aquatic Therapy, Dry Needling, Electrical stimulation, Spinal manipulation, Spinal mobilization, Cryotherapy, Moist heat, Manual therapy, and Re-evaluation   PLAN FOR NEXT SESSION:  Reassess goals. Continue to progress shoulder AROM and parascapular strengthening exercises     Bradly Chris PT, DPT  04/19/2022, 12:32 PM

## 2022-04-25 ENCOUNTER — Ambulatory Visit: Payer: Medicaid Other | Admitting: Physical Therapy

## 2022-04-25 ENCOUNTER — Encounter: Payer: Self-pay | Admitting: Physical Therapy

## 2022-04-25 ENCOUNTER — Telehealth: Payer: Self-pay | Admitting: Physical Medicine and Rehabilitation

## 2022-04-25 DIAGNOSIS — M25512 Pain in left shoulder: Secondary | ICD-10-CM | POA: Diagnosis not present

## 2022-04-25 NOTE — Therapy (Signed)
OUTPATIENT PHYSICAL THERAPY TREATMENT NOTE   Patient Name: Joshua Cole MRN: [id number] DOB:[birth year], 61 y.o., male Today's Date: 04/25/2022  PCP: No PCP   REFERRING PROVIDER: Dr. Quentin Ore   END OF SESSION:   PT End of Session - 04/25/22 0940     Visit Number 10    Number of Visits 20    Date for PT Re-Evaluation 05/16/22    Authorization Type [place] Financial Assistance    Progress Note Due on Visit 10    PT Start Time 0935    PT Stop Time 1015    PT Time Calculation (min) 40 min    Activity Tolerance Patient tolerated treatment well    Behavior During Therapy [facility] for tasks assessed/performed              Past Medical History:  Diagnosis Date   Arthritis    Asthma    Diabetes mellitus without complication ([place])    type 2   DJD (degenerative joint disease)    Dysrhythmia    junctional tachycardia and incomplete heart block   Gout    Lung nodules    a. 09/2021 CT chest: Interval decrease in size and number of bilateral lung nodules, likely consistent with sequelae associated with an infectious/inflammatory process.   MSSA bacteremia 06/2021   Rotator cuff tear 08/26/2021   Subacute bacterial endocarditis (SBE)    a. 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   Past Surgical History:  Procedure Laterality Date   ACHILLES TENDON REPAIR Left    many years ago per pt   BIOPSY  07/16/2021   Procedure: BIOPSY;  Surgeon: DSharyn Creamer MD;  [facility]  Service: Gastroenterology;;  EGD and COLON   COLONOSCOPY N/A 07/16/2021   Procedure: COLONOSCOPY;  Surgeon: DSharyn Creamer MD;  [facility]  Service: Gastroenterology;  Laterality: N/A;   COLONOSCOPY WITH PROPOFOL N/A 07/16/2021   Procedure: COLONOSCOPY WITH PROPOFOL;  Surgeon: DSharyn Creamer MD;  [place]  Service: Gastroenterology;  Laterality: N/A;   ESOPHAGOGASTRODUODENOSCOPY (EGD) WITH PROPOFOL N/A 07/16/2021   Procedure: ESOPHAGOGASTRODUODENOSCOPY (EGD) WITH PROPOFOL;  Surgeon: DSharyn Creamer MD;  [place] [facility]  Service: Gastroenterology;  Laterality: N/A;   FOOT SURGERY Right    ligaments repaired- many years ago per pt   JOINT REPLACEMENT Bilateral    hip   POLYPECTOMY  07/16/2021   Procedure: POLYPECTOMY;  Surgeon: DSharyn Creamer MD;  [facility];  Service: Gastroenterology;;   SHOULDER ARTHROSCOPY WITH ROTATOR CUFF REPAIR AND SUBACROMIAL DECOMPRESSION Left 02/10/2022   Procedure: LEFT SHOULDER ARTHROSCOPY WITH EXTENSIVE DEBRIDEMENT, SUBACROMIAL DECOMPRESSION;  Surgeon: BMcarthur Rossetti MD;  [place] [facility]  Service: Orthopedics;  Laterality: Left;   TEE WITHOUT CARDIOVERSION N/A 07/07/2021   Procedure: TRANSESOPHAGEAL ECHOCARDIOGRAM (TEE);  Surgeon: AKate Sable MD;  Location: [facility];  Service: Cardiovascular;  Laterality: N/A;   Patient Active Problem List   Diagnosis Date Noted   Acid reflux 03/31/2022   Left hip pain 03/31/2022   Complete tear of left rotator cuff 02/10/2022   Headache 11/02/2021   Peripheral neuropathy 10/05/2021   Acute gout 08/26/2021   Rotator cuff tear 08/26/2021   Encounter to establish care 08/25/2021   Type 2 diabetes mellitus without complications ([facility] 153/66/4403  History of gout 08/25/2021   Left shoulder pain  08/25/2021   Duodenal ulcer    History of colonic polyps    Acute blood loss anemia    Heart block AV complete (HCC)    Abnormal LFTs    MSSA bacteremia 07/08/2021   Acute bacterial endocarditis    Heart block    Bacteremia    Hyponatremia 40/76/8088   Acute metabolic encephalopathy 07/15/1593   Joint pain 07/03/2021   Generalized weakness 07/03/2021   SIRS (systemic inflammatory response syndrome) ([place]) 07/03/2021   Hypokalemia 07/03/2021   Hyperglycemia 07/03/2021    Thrombocytopenia ([place]) 07/03/2021   Nausea vomiting and diarrhea 07/03/2021   Gout     REFERRING DIAG: Z98.890 (ICD-10-CM) - Status post arthroscopy of left shoulder   THERAPY DIAG:  Acute pain of left shoulder  Rationale for Evaluation and Treatment Rehabilitation  PERTINENT HISTORY: Per note from Dr. [name] on 02/17/22   The patient is following up at 1 week status post a left shoulder arthroscopy with subacromial decompression and extensive debridement.  We found severe synovitis in the shoulder but no rotator cuff tear.  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   I did share with him the arthroscopy pictures showing him the amount of synovitis in his shoulder.  It is essential we get him set up for outpatient physical therapy to work on any modalities that can get his shoulder moving better and decrease his pain.  Incisions look good.  The sutures have been removed.   We will see him back in 4 weeks to see how he is doing overall.  Apparently has a remote history of hip replacements and he needs to have these evaluated.  At his next visit awe will have a AP pelvis and lateral both hips.  PRECAUTIONS: None   SUBJECTIVE: Patient reports increased stiffness in his left shoulder after spending most weekend in bed due to sickness.    PAIN:  Are you having pain? Yes: NPRS scale: 1/10 Pain location: Left anterior shoulder Pain description: Achy Aggravating factors: moving shoulder overhead  Relieving factors: nothing really helps          VITALS: BP 156/96  HR 97 SpO2 100    DIAGNOSTIC FINDINGS:  CLINICAL DATA:  Left shoulder pain   EXAM: MRI OF THE LEFT SHOULDER WITHOUT AND WITH CONTRAST   TECHNIQUE: Multiplanar, multisequence MR imaging of the left shoulder was performed before and after the administration  of intravenous contrast.   CONTRAST:  18m GADAVIST GADOBUTROL 1 MMOL/ML IV SOLN   COMPARISON:  Left shoulder radiograph 07/12/2021   FINDINGS: Rotator cuff: There is a full-thickness, near full width tear of the supraspinatus tendon at the footprint (coronal T2 image 12). There is high-grade articular sided tearing of the anterior fibers of the infraspinatus tendon at the footprint (sagittal T2 image 4-6). Mild tendinosis of the cephalad fibers of the subscapularis tendon. The teres minor is intact.   Muscles: Intramuscular edema within the infraspinatus and deltoid muscles. No significant muscle atrophy.   Biceps Long Head: Intact intra and extra-articular long head biceps tendon.   Acromioclavicular Joint: Moderate-severe arthropathy of the acromioclavicular joint. There is distension of the subacromial-subdeltoid bursa with thick peripheral enhancement, short axis measuring up to 0.9 cm. (Coronal T1 post-contrast image 12, sagittal post image 5).   Glenohumeral Joint: Trace glenohumeral joint fluid. Mild chondrosis.  Labrum: Degenerative superior labral tearing extending anteriorly and posteriorly through the biceps labral anchor.   Bones: No fracture or dislocation. No aggressive osseous lesion. No convincing marrow signal changes to suggest osteomyelitis. There is low T1 signal within the scapula, acromion, clavicle, nonspecific.   Other: No fluid collection or hematoma.   IMPRESSION: Full-thickness, near full width tear of the supraspinatus tendon at the footprint. High-grade articular sided tearing of the anterior fibers of the infraspinatus tendon at the footprint. Mild subscapularis tendinosis of the cephalad fibers. No significant muscle atrophy.   Distension of the subacromial-subdeltoid bursa with a peripheral enhancement, short axis measuring up to 0.9 cm. While this could be related to the patient's rotator cuff tear, given adjacent reactive edema in  the deltoid and clinical concern for infection, bursa aspiration should be considered.   Minimal there is minimal glenohumeral joint fluid and without adjacent marrow signal abnormality to suggest osteomyelitis. Low T1 signal within the scapula, acromion and clavicle is nonspecific, possibly related to the patient's anemia.   Degenerative superior labral tearing anteriorly and posteriorly through the biceps labral anchor.   Moderate-severe AC joint arthropathy.     Electronically Signed   By: Maurine Simmering M.D.   On: 07/14/2021 16:59   PATIENT SURVEYS:  FOTO 50/65   COGNITION:           Overall cognitive status: Within functional limits for tasks assessed                                  SENSATION: WFL   POSTURE: Slight round in shoulders    UPPER EXTREMITY ROM:         Active ROM Right eval Left eval  Shoulder flexion 80 60*  Shoulder extension 60 60  Shoulder abduction 80 60*  Shoulder adduction      Shoulder internal rotation 70 50*  Shoulder external rotation 90 70*  Elbow flexion 150 150  Elbow extension 60 60  Wrist flexion 80 80  Wrist extension 70 70  Wrist ulnar deviation 30 30  Wrist radial deviation 20 20  Wrist pronation      Wrist supination      (Blank rows = not tested)      Passive ROM Right eval Left eval  Shoulder flexion 160 180  Shoulder extension 60 60  Shoulder abduction 180  180  Shoulder adduction      Shoulder internal rotation 70 70  Shoulder external rotation 90 90  Elbow flexion 150 150  Elbow extension 60 60  Wrist flexion 80 80  Wrist extension 70 70  Wrist ulnar deviation 30 30  Wrist radial deviation 20 20  Wrist pronation      Wrist supination      (Blank rows = not tested)              UPPER EXTREMITY MMT:   MMT Right eval Left eval  Shoulder flexion 4 3-  Shoulder extension 5 5  Shoulder abduction 4 3-  Shoulder adduction      Shoulder internal rotation 4 4-  Shoulder external rotation 4 4-   Middle trapezius 4 4  Lower trapezius 4 3-  Elbow flexion 5 5  Elbow extension 5 5  Wrist flexion 5 5  Wrist extension 5 5  Wrist ulnar deviation      Wrist radial deviation      Wrist pronation  Wrist supination      Grip strength (lbs)      (Blank rows = not tested)   SHOULDER SPECIAL TESTS:            Impingement tests:  Painful Arch + L               SLAP lesions:  NT            Instability tests:  NT            Rotator cuff assessment:  Infraspinatus + L            Biceps assessment: Speed's Test: + L        JOINT MOBILITY TESTING:  Shoulder Right Inferior Glides Grade III-firm end feel  Shoulder Right AP Glides Grade III-IV - firm end feel    PALPATION:  TTP in anterior of left shoulder              TODAY'S TREATMENT:   04/25/22 UBE seat 9 with resistance level 2- 5 min ( 2.5 min forward and 2.5 min backward) Shoulder AAROM Flex/Ext Pulleys 3 x 10  Shoulder AAROM Abd/Add Pulleys 3 x 10  ROM   Shoulder Flex R/L 130/60*, Shoulder Abd R/L 160/70*, Shoulder ER R/L 90/40* Shoulder IR R/L 70/70   Shoulder Flex   PROM R 160*, Shoulder Abd R 150*, Shoulder ER R 50    MMT  Shoulder Flex R/L 5/4+ Shoulder Abd R/L 5/4+ Shoulder ER R/L 5/4+, Shoulder IR R/L 5/4+  Mid Trap R/L 4/4  Lower Trap R/L 4-/3-     04/19/22 UBE seat 9 with resistance level 3- 5 min  Shoulder AAROM Flex/Ext Pulleys 3 x 10  Shoulder AAROM Abd/Add Pulleys 3 x 10 Shoulder AROM Abd 3 x 10  Shoulder AROM Flex 1 x 10  Shoulder Flex with #1 DB 3 x 10  Shoulder Rows with Gray Band 3 x 10  Face Pulls with Red Band 3 x 10    04/12/22 UBE seat 9 with resistance level 3- 5 min  Shoulder AAROM Flexion/Extension 3 x 10  Shoulder AAROM Abduction/Adduction 3 x 10  Supine Shoulder AAROM Flexion/Extension #10 AW   Supine Shoulder AAROM Scaption Plane Flexion/Extension 3 x 10    Seated Shoulder Horizontal Abduction 3 x 10    04/07/22  UBE seat 9 with resistance level 3- 5 min  Shoulder Flexion/Extension  AAROM with Pulleys 3 x 10  Shoulder Abduction/Adduction AAROM with Pulleys 3 x 10   AROM  Shoulder Flexion R/L 180/90* , Shoulder Abduction R/L 180/85*, Shoulder IR R/L 70/70*, Shoulder ER R/L 90/30*   PROM  Shoulder Flexion L 140, Abduction L 110, Shoulder L 45,    MMT Shoulder  Flex R/L 4+/4,Shoulder Abd R/L 4+/4+ , Shoulder IR R/L 4/4-, Shoulder ER R/L 4/4-, Lower Trap R/L 3+/3-, Mid Trap R/L 4/3+  Side Lying Left Shoulder Flexion 1 x 10 Side Lying Left Shoulder Flexion #1 DB 2 x 10  Side Lying Left Shoulder Abduction #1 DB 3 x 10     03/26/22 UBE seat level 9 with level 3 resistance  Shoulder Abduction/Adduction AAROM pulleys 3 x 10  -Pt able to reach 90 deg of abduction on left shoulder  Shoulder Flexion/Extension AAROM pulleys 3 x 10  Shoulder AROM Flexion/Extension to 90 deg 2 x 10  Shoulder AROM Abduction/Adduction -Left shoulder only able to abduct to 60 deg  MANUAL   Upper Trap myofacial release and massage   Upper Trap  Stretch  Cervical Traction  Left Shoulder Traction  Left Shoulder PROM Abduction to 120 degrees     PATIENT EDUCATION: Education details: form and technique for appropriate exercise and explanation of plan of care  Person educated: Patient Education method: Customer service manager and handout  Education comprehension: verbalized understanding, returned demonstration, and verbal cues required     HOME EXERCISE PROGRAM: Access Code: [email or web address] URL: [email or web address] Date: 04/19/2022 Prepared by: Bradly Chris  Exercises - Seated Shoulder Flexion AAROM with Pulley Behind  - 1 x daily - 7 x weekly - 3 sets - 10 reps - Seated Shoulder Abduction AAROM with Pulley Behind  - 1 x daily - 7 x weekly - 3 sets - 10 reps - Supine Shoulder Flexion Extension AAROM with Dowel  - 1 x daily - 7 x weekly - 3 sets - 10 reps - Shoulder Single Arm Flexion AAROM  - 1 x daily - 7 x weekly - 3 sets - 10 reps - Wall Push Up with Plus  - 1 x  daily - 3 x weekly - 3 sets - 10 reps - Seated Upper Trapezius Stretch  - 1 x daily - 7 x weekly - 1 sets - 3 reps - 60 hold - Standing Shoulder Row with Anchored Resistance  - 1 x daily - 3 x weekly - 3 sets - 10 reps - Latissimus Dorsi Stretch at Wall  - 1 x daily - 7 x weekly - 1 sets - 3 reps - 30 hold - Face Pulls  - 1 x daily - 3 x weekly - 3 sets - 5 reps - Sidelying Shoulder External Rotation  - 1 x daily - 3 x weekly - 3 sets - 10 reps - Standing Shoulder Horizontal Abduction with Resistance  - 1 x daily - 3 x weekly - 3 sets - 10 reps - Standing Shoulder Flexion to 90 Degrees with Dumbbells  - 1 x daily - 3 x weekly - 3 sets - 10 reps - Shoulder Abduction with Dumbbells - Thumbs Up  - 1 x daily - 3 x weekly - 3 sets - 10 reps  ASSESSMENT:   CLINICAL IMPRESSION:  Pt shows an improvement in left shoulder ROM and strength since the initial session with a reduction in pain. Despite gains in ROM in strength, pt continues to show deficits with AROM especially in abduction and flexion above 90 degrees indicating likely muscular weakness rather joint abnormalities. He also shows continued deficits with parascapular strength. He has made sufficient gains to justify additional PT sessions to attempt to strengthen with in his available PROM to regain AROM and strength. He will continue to benefit from skilled PT to increase left shoulder ROM and strength to return to using left shoulder for overhead activities.    OBJECTIVE IMPAIRMENTS decreased ROM, decreased strength, hypomobility, impaired UE functional use, and pain.    ACTIVITY LIMITATIONS carrying, lifting, dressing, and reach over head   PARTICIPATION LIMITATIONS: cleaning, occupation, and yard work   PERSONAL FACTORS Fitness, Sex, and Time since onset of injury/illness/exacerbation are also affecting patient's functional outcome.    REHAB POTENTIAL: Good   CLINICAL DECISION MAKING: Stable/uncomplicated   EVALUATION COMPLEXITY:  Low     GOALS: Goals reviewed with patient? No   SHORT TERM GOALS: Target date: 03/21/2022     Pt will be independent with HEP in order to improve strength and balance in order to decrease fall risk and improve function at home and work. Baseline: Performing HEP  independently  Goal status: Partially Met      LONG TERM GOALS: Target date: 05/16/2022     Patient will have improved function and activity level as evidenced by an increase in FOTO score by 10 points or more.  Baseline: 50/65  04/07/22: 42/65 04/25/22: 57/65 Goal status: ONGOING    2.  Patient will improve left shoulder ROM to be close to symmetrical to right shoulder to return to performing overhead activities to complete job related tasks and yard work.  Baseline: Shoulder Flexion R/L 80*/60* , Shoulder Abduction R/L 80*/60*, Shoulder IR R/L 70/50*, Shoulder ER R/L 90/70* 04/25/22: Shoulder Flex R/L 130/60*, Shoulder Abd R/L 160/70*, Shoulder ER R/L 90/40* Shoulder IR R/L 70/70   Shoulder Flex PROM R 160*, Shoulder Abd R 150*, Shoulder ER R 50   Goal status: ONGOING    3.  Patient will improve shoulder MMT by >=1/2 muscle grade for improved function to return to completing job related tasks and yard work.  Baseline: Shoulder  Flex R/L 4/3-,Shoulder Abd R/L 4/3- , Shoulder IR R/L 4/4-, Shoulder ER R/L 4/4-, Lower Trap R/L 4-/3-, Mid Trap R/L 4/4 04/25/22: Shoulder Flex R/L 5/4+ Shoulder Abd R/L 5/4+ Shoulder ER R/L 5/4+, Shoulder IR R/L 5/4+  Mid Trap R/L 4/4  Lower Trap R/L 4-/3-    Goal status: ONGOING      PLAN: PT FREQUENCY: 1-2x/week   PT DURATION: 10 weeks   PLANNED INTERVENTIONS: Therapeutic exercises, Therapeutic activity, Balance training, Patient/Family education, Joint manipulation, Joint mobilization, Aquatic Therapy, Dry Needling, Electrical stimulation, Spinal manipulation, Spinal mobilization, Cryotherapy, Moist heat, Manual therapy, and Re-evaluation   PLAN FOR NEXT SESSION:  Continue to progress shoulder AROM  and parascapular strengthening exercises     Bradly Chris PT, DPT  04/25/2022, 9:42 AM

## 2022-04-25 NOTE — Telephone Encounter (Signed)
Pt called and is wondering if can get his injection at [facility]?

## 2022-04-26 ENCOUNTER — Other Ambulatory Visit: Payer: Self-pay

## 2022-04-26 DIAGNOSIS — M25552 Pain in left hip: Secondary | ICD-10-CM

## 2022-04-26 NOTE — Telephone Encounter (Signed)
Resent referral  

## 2022-04-28 ENCOUNTER — Encounter: Payer: Self-pay | Admitting: Physical Therapy

## 2022-04-28 ENCOUNTER — Ambulatory Visit: Payer: Medicaid Other | Admitting: Physical Therapy

## 2022-04-28 DIAGNOSIS — M25512 Pain in left shoulder: Secondary | ICD-10-CM | POA: Diagnosis not present

## 2022-04-28 NOTE — Therapy (Signed)
OUTPATIENT PHYSICAL THERAPY TREATMENT NOTE   Patient Name: Joshua Cole MRN: [id number] DOB:07-23-1961, 61 y.o., male Today's Date: 04/28/2022  PCP: No PCP   REFERRING PROVIDER: Dr. Quentin Ore   END OF SESSION:   PT End of Session - 04/28/22 1019     Visit Number 11    Number of Visits 20    Date for PT Re-Evaluation 05/16/22    Authorization Type [place] Financial Assistance    Progress Note Due on Visit 20    PT Start Time 1020    PT Stop Time 1100    PT Time Calculation (min) 40 min    Activity Tolerance Patient tolerated treatment well    Behavior During Therapy WFL for tasks assessed/performed              Past Medical History:  Diagnosis Date   Arthritis    Asthma    Diabetes mellitus without complication ([place])    type 2   DJD (degenerative joint disease)    Dysrhythmia    junctional tachycardia and incomplete heart block   Gout    Lung nodules    a. 09/2021 CT chest: Interval decrease in size and number of bilateral lung nodules, likely consistent with sequelae associated with an infectious/inflammatory process.   MSSA bacteremia 06/2021   Rotator cuff tear 08/26/2021   Subacute bacterial endocarditis (SBE)    a. 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   Past Surgical History:  Procedure Laterality Date   ACHILLES TENDON REPAIR Left    many years ago per pt   BIOPSY  07/16/2021   Procedure: BIOPSY;  Surgeon: DSharyn Creamer MD;  [facility]  Service: Gastroenterology;;  EGD and COLON   COLONOSCOPY N/A 07/16/2021   Procedure: COLONOSCOPY;  Surgeon: DSharyn Creamer MD;  [facility]  Service: Gastroenterology;  Laterality: N/A;   COLONOSCOPY WITH PROPOFOL N/A 07/16/2021   Procedure: COLONOSCOPY WITH PROPOFOL;  Surgeon: DSharyn Creamer MD;  [facility]  Service: Gastroenterology;  Laterality: N/A;   ESOPHAGOGASTRODUODENOSCOPY (EGD) WITH PROPOFOL N/A 07/16/2021   Procedure: ESOPHAGOGASTRODUODENOSCOPY (EGD) WITH PROPOFOL;  Surgeon: DSharyn Creamer MD;  [facility]  Service: Gastroenterology;  Laterality: N/A;   FOOT SURGERY Right    ligaments repaired- many years ago per pt   JOINT REPLACEMENT Bilateral    hip   POLYPECTOMY  07/16/2021   Procedure: POLYPECTOMY;  Surgeon: DSharyn Creamer MD;  [facility];  Service: Gastroenterology;;   SHOULDER ARTHROSCOPY WITH ROTATOR CUFF REPAIR AND SUBACROMIAL DECOMPRESSION Left 02/10/2022   Procedure: LEFT SHOULDER ARTHROSCOPY WITH EXTENSIVE DEBRIDEMENT, SUBACROMIAL DECOMPRESSION;  Surgeon: BMcarthur Rossetti MD;  [facility]  Service: Orthopedics;  Laterality: Left;   TEE WITHOUT CARDIOVERSION N/A 07/07/2021   Procedure: TRANSESOPHAGEAL ECHOCARDIOGRAM (TEE);  Surgeon: AKate Sable MD;  Location: [facility];  Service: Cardiovascular;  Laterality: N/A;   Patient Active Problem List   Diagnosis Date Noted   Acid reflux 03/31/2022   Left hip pain 03/31/2022   Complete tear of left rotator cuff 02/10/2022   Headache 11/02/2021   Peripheral neuropathy 10/05/2021   Acute gout 08/26/2021   Rotator cuff tear 08/26/2021   Encounter to establish care 08/25/2021   Type 2 diabetes mellitus without complications ([place] 138/75/6433  History of gout 08/25/2021   Left shoulder pain  08/25/2021   Duodenal ulcer    History of colonic polyps    Acute blood loss anemia    Heart block AV complete (HCC)    Abnormal LFTs    MSSA bacteremia 07/08/2021   Acute bacterial endocarditis    Heart block    Bacteremia    Hyponatremia 93/57/0177   Acute metabolic encephalopathy 93/90/3009   Joint pain 07/03/2021   Generalized weakness 07/03/2021   SIRS (systemic inflammatory response syndrome) ([place]) 07/03/2021   Hypokalemia 07/03/2021   Hyperglycemia 07/03/2021    Thrombocytopenia ([place]) 07/03/2021   Nausea vomiting and diarrhea 07/03/2021   Gout     REFERRING DIAG: Z98.890 (ICD-10-CM) - Status post arthroscopy of left shoulder   THERAPY DIAG:  Acute pain of left shoulder  Rationale for Evaluation and Treatment Rehabilitation  PERTINENT HISTORY: Per note from Dr. [name] on 02/17/22   The patient is following up at 1 week status post a left shoulder arthroscopy with subacromial decompression and extensive debridement.  We found severe synovitis in the shoulder but no rotator cuff tear.  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   I did share with him the arthroscopy pictures showing him the amount of synovitis in his shoulder.  It is essential we get him set up for outpatient physical therapy to work on any modalities that can get his shoulder moving better and decrease his pain.  Incisions look good.  The sutures have been removed.   We will see him back in 4 weeks to see how he is doing overall.  Apparently has a remote history of hip replacements and he needs to have these evaluated.  At his next visit awe will have a AP pelvis and lateral both hips.  PRECAUTIONS: None   SUBJECTIVE: Patient reports increased stiffness in his left shoulder and that exercises have been going well.   PAIN:  Are you having pain? Yes: NPRS scale: 4/10 Pain location: Left anterior shoulder Pain description: Achy Aggravating factors: moving shoulder overhead  Relieving factors: nothing really helps          VITALS: BP 156/96  HR 97 SpO2 100    DIAGNOSTIC FINDINGS:  CLINICAL DATA:  Left shoulder pain   EXAM: MRI OF THE LEFT SHOULDER WITHOUT AND WITH CONTRAST   TECHNIQUE: Multiplanar, multisequence MR imaging of the left shoulder was performed before and after the administration of  intravenous contrast.   CONTRAST:  16m GADAVIST GADOBUTROL 1 MMOL/ML IV SOLN   COMPARISON:  Left shoulder radiograph 07/12/2021   FINDINGS: Rotator cuff: There is a full-thickness, near full width tear of the supraspinatus tendon at the footprint (coronal T2 image 12). There is high-grade articular sided tearing of the anterior fibers of the infraspinatus tendon at the footprint (sagittal T2 image 4-6). Mild tendinosis of the cephalad fibers of the subscapularis tendon. The teres minor is intact.   Muscles: Intramuscular edema within the infraspinatus and deltoid muscles. No significant muscle atrophy.   Biceps Long Head: Intact intra and extra-articular long head biceps tendon.   Acromioclavicular Joint: Moderate-severe arthropathy of the acromioclavicular joint. There is distension of the subacromial-subdeltoid bursa with thick peripheral enhancement, short axis measuring up to 0.9 cm. (Coronal T1 post-contrast image 12, sagittal post image 5).   Glenohumeral Joint: Trace glenohumeral joint fluid. Mild chondrosis.   Labrum: Degenerative superior  labral tearing extending anteriorly and posteriorly through the biceps labral anchor.   Bones: No fracture or dislocation. No aggressive osseous lesion. No convincing marrow signal changes to suggest osteomyelitis. There is low T1 signal within the scapula, acromion, clavicle, nonspecific.   Other: No fluid collection or hematoma.   IMPRESSION: Full-thickness, near full width tear of the supraspinatus tendon at the footprint. High-grade articular sided tearing of the anterior fibers of the infraspinatus tendon at the footprint. Mild subscapularis tendinosis of the cephalad fibers. No significant muscle atrophy.   Distension of the subacromial-subdeltoid bursa with a peripheral enhancement, short axis measuring up to 0.9 cm. While this could be related to the patient's rotator cuff tear, given adjacent reactive edema in the  deltoid and clinical concern for infection, bursa aspiration should be considered.   Minimal there is minimal glenohumeral joint fluid and without adjacent marrow signal abnormality to suggest osteomyelitis. Low T1 signal within the scapula, acromion and clavicle is nonspecific, possibly related to the patient's anemia.   Degenerative superior labral tearing anteriorly and posteriorly through the biceps labral anchor.   Moderate-severe AC joint arthropathy.     Electronically Signed   By: Maurine Simmering M.D.   On: 07/14/2021 16:59   PATIENT SURVEYS:  FOTO 50/65   COGNITION:           Overall cognitive status: Within functional limits for tasks assessed                                  SENSATION: WFL   POSTURE: Slight round in shoulders    UPPER EXTREMITY ROM:         Active ROM Right eval Left eval  Shoulder flexion 80 60*  Shoulder extension 60 60  Shoulder abduction 80 60*  Shoulder adduction      Shoulder internal rotation 70 50*  Shoulder external rotation 90 70*  Elbow flexion 150 150  Elbow extension 60 60  Wrist flexion 80 80  Wrist extension 70 70  Wrist ulnar deviation 30 30  Wrist radial deviation 20 20  Wrist pronation      Wrist supination      (Blank rows = not tested)      Passive ROM Right eval Left eval  Shoulder flexion 160 180  Shoulder extension 60 60  Shoulder abduction 180  180  Shoulder adduction      Shoulder internal rotation 70 70  Shoulder external rotation 90 90  Elbow flexion 150 150  Elbow extension 60 60  Wrist flexion 80 80  Wrist extension 70 70  Wrist ulnar deviation 30 30  Wrist radial deviation 20 20  Wrist pronation      Wrist supination      (Blank rows = not tested)              UPPER EXTREMITY MMT:   MMT Right eval Left eval  Shoulder flexion 4 3-  Shoulder extension 5 5  Shoulder abduction 4 3-  Shoulder adduction      Shoulder internal rotation 4 4-  Shoulder external rotation 4 4-  Middle  trapezius 4 4  Lower trapezius 4 3-  Elbow flexion 5 5  Elbow extension 5 5  Wrist flexion 5 5  Wrist extension 5 5  Wrist ulnar deviation      Wrist radial deviation      Wrist pronation      Wrist supination  Grip strength (lbs)      (Blank rows = not tested)   SHOULDER SPECIAL TESTS:            Impingement tests:  Painful Arch + L               SLAP lesions:  NT            Instability tests:  NT            Rotator cuff assessment:  Infraspinatus + L            Biceps assessment: Speed's Test: + L        JOINT MOBILITY TESTING:  Shoulder Right Inferior Glides Grade III-firm end feel  Shoulder Right AP Glides Grade III-IV - firm end feel    PALPATION:  TTP in anterior of left shoulder              TODAY'S TREATMENT:   04/28/22 UBE Seat and Arms 8 with resistance level 2- 5 ( 2.5 min forward and 2.5 min backward) Shoulder AAROM Flex/Ext Pulleys 3 x 10  Shoulder AAROM Abd/Add Pulleys 3 x 10 Omega Seated Rows #25 3 x 10  Omega Face Pulls with #10 1 x 5  -Pt reports increased pain in left shoulder  Prone W's on LUE  -Pt unable to perform without pain  Prone T's 3 X 8 on LUE  Prone T's 1 x 8 on RUE  Prone T's 2 x 8 on RUE   04/25/22 UBE seat 9 with resistance level 2- 5 min ( 2.5 min forward and 2.5 min backward) Shoulder AAROM Flex/Ext Pulleys 3 x 10  Shoulder AAROM Abd/Add Pulleys 3 x 10  ROM   Shoulder Flex R/L 130/60*, Shoulder Abd R/L 160/70*, Shoulder ER R/L 90/40* Shoulder IR R/L 70/70   Shoulder Flex   PROM R 160*, Shoulder Abd R 150*, Shoulder ER R 50    MMT  Shoulder Flex R/L 5/4+ Shoulder Abd R/L 5/4+ Shoulder ER R/L 5/4+, Shoulder IR R/L 5/4+  Mid Trap R/L 4/4  Lower Trap R/L 4-/3-     04/19/22 UBE seat 9 with resistance level 3- 5 min  Shoulder AAROM Flex/Ext Pulleys 3 x 10  Shoulder AAROM Abd/Add Pulleys 3 x 10 Shoulder AROM Abd 3 x 10  Shoulder AROM Flex 1 x 10  Shoulder Flex with #1 DB 3 x 10  Shoulder Rows with Gray Band 3 x 10  Face Pulls  with Red Band 3 x 10    04/12/22 UBE seat 9 with resistance level 3- 5 min  Shoulder AAROM Flexion/Extension 3 x 10  Shoulder AAROM Abduction/Adduction 3 x 10  Supine Shoulder AAROM Flexion/Extension #10 AW   Supine Shoulder AAROM Scaption Plane Flexion/Extension 3 x 10    Seated Shoulder Horizontal Abduction 3 x 10       PATIENT EDUCATION: Education details: form and technique for appropriate exercise and explanation of plan of care  Person educated: Patient Education method: Customer service manager and handout  Education comprehension: verbalized understanding, returned demonstration, and verbal cues required     HOME EXERCISE PROGRAM: Access Code: [email or web address] URL: [email or web address] Date: 04/19/2022 Prepared by: Bradly Chris  Exercises - Seated Shoulder Flexion AAROM with Pulley Behind  - 1 x daily - 7 x weekly - 3 sets - 10 reps - Seated Shoulder Abduction AAROM with Pulley Behind  - 1 x daily - 7 x weekly - 3 sets - 10 reps - Supine Shoulder Flexion Extension  AAROM with Dowel  - 1 x daily - 7 x weekly - 3 sets - 10 reps - Shoulder Single Arm Flexion AAROM  - 1 x daily - 7 x weekly - 3 sets - 10 reps - Wall Push Up with Plus  - 1 x daily - 3 x weekly - 3 sets - 10 reps - Seated Upper Trapezius Stretch  - 1 x daily - 7 x weekly - 1 sets - 3 reps - 60 hold - Standing Shoulder Row with Anchored Resistance  - 1 x daily - 3 x weekly - 3 sets - 10 reps - Latissimus Dorsi Stretch at Wall  - 1 x daily - 7 x weekly - 1 sets - 3 reps - 30 hold - Face Pulls  - 1 x daily - 3 x weekly - 3 sets - 5 reps - Sidelying Shoulder External Rotation  - 1 x daily - 3 x weekly - 3 sets - 10 reps - Standing Shoulder Horizontal Abduction with Resistance  - 1 x daily - 3 x weekly - 3 sets - 10 reps - Standing Shoulder Flexion to 90 Degrees with Dumbbells  - 1 x daily - 3 x weekly - 3 sets - 10 reps - Shoulder Abduction with Dumbbells - Thumbs Up  - 1 x daily - 3 x weekly - 3  sets - 10 reps  ASSESSMENT:   CLINICAL IMPRESSION:  Pt exhibits an improvement in parascapular strength with ability to perform strengthening exercises in prone. He still lacks left shoulder ROM to complete face pulls in standing. HEP modified accordingly. He will continue to benefit from skilled PT to increase left shoulder ROM and strength to return to using left shoulder for overhead activities.     OBJECTIVE IMPAIRMENTS decreased ROM, decreased strength, hypomobility, impaired UE functional use, and pain.    ACTIVITY LIMITATIONS carrying, lifting, dressing, and reach over head   PARTICIPATION LIMITATIONS: cleaning, occupation, and yard work   PERSONAL FACTORS Fitness, Sex, and Time since onset of injury/illness/exacerbation are also affecting patient's functional outcome.    REHAB POTENTIAL: Good   CLINICAL DECISION MAKING: Stable/uncomplicated   EVALUATION COMPLEXITY: Low     GOALS: Goals reviewed with patient? No   SHORT TERM GOALS: Target date: 03/21/2022     Pt will be independent with HEP in order to improve strength and balance in order to decrease fall risk and improve function at home and work. Baseline: Performing HEP independently  Goal status: Partially Met      LONG TERM GOALS: Target date: 05/16/2022     Patient will have improved function and activity level as evidenced by an increase in FOTO score by 10 points or more.  Baseline: 50/65  04/07/22: 42/65 04/25/22: 57/65 Goal status: ONGOING    2.  Patient will improve left shoulder ROM to be close to symmetrical to right shoulder to return to performing overhead activities to complete job related tasks and yard work.  Baseline: Shoulder Flexion R/L 80*/60* , Shoulder Abduction R/L 80*/60*, Shoulder IR R/L 70/50*, Shoulder ER R/L 90/70* 04/25/22: Shoulder Flex R/L 130/60*, Shoulder Abd R/L 160/70*, Shoulder ER R/L 90/40* Shoulder IR R/L 70/70   Shoulder Flex PROM R 160*, Shoulder Abd R 150*, Shoulder ER R 50    Goal status: ONGOING    3.  Patient will improve shoulder MMT by >=1/2 muscle grade for improved function to return to completing job related tasks and yard work.  Baseline: Shoulder  Flex R/L 4/3-,Shoulder Abd  R/L 4/3- , Shoulder IR R/L 4/4-, Shoulder ER R/L 4/4-, Lower Trap R/L 4-/3-, Mid Trap R/L 4/4 04/25/22: Shoulder Flex R/L 5/4+ Shoulder Abd R/L 5/4+ Shoulder ER R/L 5/4+, Shoulder IR R/L 5/4+  Mid Trap R/L 4/4  Lower Trap R/L 4-/3-    Goal status: ONGOING      PLAN: PT FREQUENCY: 1-2x/week   PT DURATION: 10 weeks   PLANNED INTERVENTIONS: Therapeutic exercises, Therapeutic activity, Balance training, Patient/Family education, Joint manipulation, Joint mobilization, Aquatic Therapy, Dry Needling, Electrical stimulation, Spinal manipulation, Spinal mobilization, Cryotherapy, Moist heat, Manual therapy, and Re-evaluation   PLAN FOR NEXT SESSION:  Continue to progress shoulder AROM and parascapular and shoulder strengthening exercises     Bradly Chris PT, DPT  04/28/2022, 10:23 AM

## 2022-04-29 ENCOUNTER — Ambulatory Visit
Admission: RE | Admit: 2022-04-29 | Discharge: 2022-04-29 | Disposition: A | Discharge status: Home / Self Care | Payer: Self-pay | Source: Ambulatory Visit | Attending: Orthopaedic Surgery | Admitting: Orthopaedic Surgery

## 2022-04-29 DIAGNOSIS — Z96642 Presence of left artificial hip joint: Secondary | ICD-10-CM

## 2022-04-29 DIAGNOSIS — M25552 Pain in left hip: Secondary | ICD-10-CM

## 2022-05-02 ENCOUNTER — Encounter: Payer: Self-pay | Admitting: Physical Therapy

## 2022-05-02 ENCOUNTER — Ambulatory Visit: Payer: Medicaid Other | Admitting: Physical Therapy

## 2022-05-02 DIAGNOSIS — M25512 Pain in left shoulder: Secondary | ICD-10-CM

## 2022-05-02 NOTE — Therapy (Signed)
OUTPATIENT PHYSICAL THERAPY TREATMENT NOTE   Patient Name: Joshua Cole MRN: [id number] DOB:Sep 04, 1961, 61 y.o., male Today's Date: 05/02/2022  PCP: No PCP   REFERRING PROVIDER: Dr. Quentin Ore   END OF SESSION:   PT End of Session - 05/02/22 0856     Visit Number 12    Number of Visits 20    Date for PT Re-Evaluation 05/16/22    Authorization Type [name] Financial Assistance    Progress Note Due on Visit 20    PT Start Time 0850    PT Stop Time 0930    PT Time Calculation (min) 40 min    Activity Tolerance Patient tolerated treatment well    Behavior During Therapy [facility] for tasks assessed/performed              Past Medical History:  Diagnosis Date   Arthritis    Asthma    Diabetes mellitus without complication ([place])    type 2   DJD (degenerative joint disease)    Dysrhythmia    junctional tachycardia and incomplete heart block   Gout    Lung nodules    a. 09/2021 CT chest: Interval decrease in size and number of bilateral lung nodules, likely consistent with sequelae associated with an infectious/inflammatory process.   MSSA bacteremia 06/2021   Rotator cuff tear 08/26/2021   Subacute bacterial endocarditis (SBE)    a. 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   Past Surgical History:  Procedure Laterality Date   ACHILLES TENDON REPAIR Left    many years ago per pt   BIOPSY  07/16/2021   Procedure: BIOPSY;  Surgeon: DSharyn Creamer MD;  [facility]  Service: Gastroenterology;;  EGD and COLON   COLONOSCOPY N/A 07/16/2021   Procedure: COLONOSCOPY;  Surgeon: DSharyn Creamer MD;  [place] [facility]  Service: Gastroenterology;  Laterality: N/A;   COLONOSCOPY WITH PROPOFOL N/A 07/16/2021   Procedure: COLONOSCOPY WITH PROPOFOL;  Surgeon: DSharyn Creamer MD;  [facility]  Service: Gastroenterology;  Laterality: N/A;   ESOPHAGOGASTRODUODENOSCOPY (EGD) WITH PROPOFOL N/A 07/16/2021   Procedure: ESOPHAGOGASTRODUODENOSCOPY (EGD) WITH PROPOFOL;  Surgeon: DSharyn Creamer MD;  [place] [facility]  Service: Gastroenterology;  Laterality: N/A;   FOOT SURGERY Right    ligaments repaired- many years ago per pt   JOINT REPLACEMENT Bilateral    hip   POLYPECTOMY  07/16/2021   Procedure: POLYPECTOMY;  Surgeon: DSharyn Creamer MD;  [facility];  Service: Gastroenterology;;   SHOULDER ARTHROSCOPY WITH ROTATOR CUFF REPAIR AND SUBACROMIAL DECOMPRESSION Left 02/10/2022   Procedure: LEFT SHOULDER ARTHROSCOPY WITH EXTENSIVE DEBRIDEMENT, SUBACROMIAL DECOMPRESSION;  Surgeon: BMcarthur Rossetti MD;  [place] [facility]  Service: Orthopedics;  Laterality: Left;   TEE WITHOUT CARDIOVERSION N/A 07/07/2021   Procedure: TRANSESOPHAGEAL ECHOCARDIOGRAM (TEE);  Surgeon: AKate Sable MD;  Location: [facility];  Service: Cardiovascular;  Laterality: N/A;   Patient Active Problem List   Diagnosis Date Noted   Acid reflux 03/31/2022   Left hip pain 03/31/2022   Complete tear of left rotator cuff 02/10/2022   Headache 11/02/2021   Peripheral neuropathy 10/05/2021   Acute gout 08/26/2021   Rotator cuff tear 08/26/2021   Encounter to establish care 08/25/2021   Type 2 diabetes mellitus without complications ([facility] 180/16/5537  History of gout 08/25/2021   Left shoulder pain  08/25/2021   Duodenal ulcer    History of colonic polyps    Acute blood loss anemia    Heart block AV complete (H[facility])    Abnormal LFTs    MSSA bacteremia 07/08/2021   Acute bacterial endocarditis    Heart block    Bacteremia    Hyponatremia 57/90/3833   Acute metabolic encephalopathy 38/32/9191   Joint pain 07/03/2021   Generalized weakness 07/03/2021   SIRS (systemic inflammatory response syndrome) ([place]) 07/03/2021   Hypokalemia 07/03/2021   Hyperglycemia 07/03/2021    Thrombocytopenia ([place]) 07/03/2021   Nausea vomiting and diarrhea 07/03/2021   Gout     REFERRING DIAG: Z98.890 (ICD-10-CM) - Status post arthroscopy of left shoulder   THERAPY DIAG:  Acute pain of left shoulder  Rationale for Evaluation and Treatment Rehabilitation  PERTINENT HISTORY: Per note from Dr. [name] on 02/17/22   The patient is following up at 1 week status post a left shoulder arthroscopy with subacromial decompression and extensive debridement.  We found severe synovitis in the shoulder but no rotator cuff tear.  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   I did share with him the arthroscopy pictures showing him the amount of synovitis in his shoulder.  It is essential we get him set up for outpatient physical therapy to work on any modalities that can get his shoulder moving better and decrease his pain.  Incisions look good.  The sutures have been removed.   We will see him back in 4 weeks to see how he is doing overall.  Apparently has a remote history of hip replacements and he needs to have these evaluated.  At his next visit awe will have a AP pelvis and lateral both hips.  PRECAUTIONS: None   SUBJECTIVE: Patient reports that his left shoulder feels about the same as last time and he is still trying to work on his strength.   PAIN:  Are you having pain? Yes: NPRS scale: 3/10 Pain location: Left anterior shoulder Pain description: Achy Aggravating factors: moving shoulder overhead  Relieving factors: nothing really helps          VITALS: BP 156/96  HR 97 SpO2 100    DIAGNOSTIC FINDINGS:  CLINICAL DATA:  Left shoulder pain   EXAM: MRI OF THE LEFT SHOULDER WITHOUT AND WITH CONTRAST   TECHNIQUE: Multiplanar, multisequence MR imaging of the left shoulder was performed before and after the  administration of intravenous contrast.   CONTRAST:  44m GADAVIST GADOBUTROL 1 MMOL/ML IV SOLN   COMPARISON:  Left shoulder radiograph 07/12/2021   FINDINGS: Rotator cuff: There is a full-thickness, near full width tear of the supraspinatus tendon at the footprint (coronal T2 image 12). There is high-grade articular sided tearing of the anterior fibers of the infraspinatus tendon at the footprint (sagittal T2 image 4-6). Mild tendinosis of the cephalad fibers of the subscapularis tendon. The teres minor is intact.   Muscles: Intramuscular edema within the infraspinatus and deltoid muscles. No significant muscle atrophy.   Biceps Long Head: Intact intra and extra-articular long head biceps tendon.   Acromioclavicular Joint: Moderate-severe arthropathy of the acromioclavicular joint. There is distension of the subacromial-subdeltoid bursa with thick peripheral enhancement, short axis measuring up to 0.9 cm. (Coronal T1 post-contrast image 12, sagittal post image 5).   Glenohumeral Joint: Trace glenohumeral joint  fluid. Mild chondrosis.   Labrum: Degenerative superior labral tearing extending anteriorly and posteriorly through the biceps labral anchor.   Bones: No fracture or dislocation. No aggressive osseous lesion. No convincing marrow signal changes to suggest osteomyelitis. There is low T1 signal within the scapula, acromion, clavicle, nonspecific.   Other: No fluid collection or hematoma.   IMPRESSION: Full-thickness, near full width tear of the supraspinatus tendon at the footprint. High-grade articular sided tearing of the anterior fibers of the infraspinatus tendon at the footprint. Mild subscapularis tendinosis of the cephalad fibers. No significant muscle atrophy.   Distension of the subacromial-subdeltoid bursa with a peripheral enhancement, short axis measuring up to 0.9 cm. While this could be related to the patient's rotator cuff tear, given adjacent  reactive edema in the deltoid and clinical concern for infection, bursa aspiration should be considered.   Minimal there is minimal glenohumeral joint fluid and without adjacent marrow signal abnormality to suggest osteomyelitis. Low T1 signal within the scapula, acromion and clavicle is nonspecific, possibly related to the patient's anemia.   Degenerative superior labral tearing anteriorly and posteriorly through the biceps labral anchor.   Moderate-severe AC joint arthropathy.     Electronically Signed   By: Maurine Simmering M.D.   On: 07/14/2021 16:59   PATIENT SURVEYS:  FOTO 50/65   COGNITION:           Overall cognitive status: Within functional limits for tasks assessed                                  SENSATION: WFL   POSTURE: Slight round in shoulders    UPPER EXTREMITY ROM:         Active ROM Right eval Left eval  Shoulder flexion 80 60*  Shoulder extension 60 60  Shoulder abduction 80 60*  Shoulder adduction      Shoulder internal rotation 70 50*  Shoulder external rotation 90 70*  Elbow flexion 150 150  Elbow extension 60 60  Wrist flexion 80 80  Wrist extension 70 70  Wrist ulnar deviation 30 30  Wrist radial deviation 20 20  Wrist pronation      Wrist supination      (Blank rows = not tested)      Passive ROM Right eval Left eval  Shoulder flexion 160 180  Shoulder extension 60 60  Shoulder abduction 180  180  Shoulder adduction      Shoulder internal rotation 70 70  Shoulder external rotation 90 90  Elbow flexion 150 150  Elbow extension 60 60  Wrist flexion 80 80  Wrist extension 70 70  Wrist ulnar deviation 30 30  Wrist radial deviation 20 20  Wrist pronation      Wrist supination      (Blank rows = not tested)              UPPER EXTREMITY MMT:   MMT Right eval Left eval  Shoulder flexion 4 3-  Shoulder extension 5 5  Shoulder abduction 4 3-  Shoulder adduction      Shoulder internal rotation 4 4-  Shoulder external  rotation 4 4-  Middle trapezius 4 4  Lower trapezius 4 3-  Elbow flexion 5 5  Elbow extension 5 5  Wrist flexion 5 5  Wrist extension 5 5  Wrist ulnar deviation      Wrist radial deviation      Wrist pronation  Wrist supination      Grip strength (lbs)      (Blank rows = not tested)   SHOULDER SPECIAL TESTS:            Impingement tests:  Painful Arch + L               SLAP lesions:  NT            Instability tests:  NT            Rotator cuff assessment:  Infraspinatus + L            Biceps assessment: Speed's Test: + L        JOINT MOBILITY TESTING:  Shoulder Right Inferior Glides Grade III-firm end feel  Shoulder Right AP Glides Grade III-IV - firm end feel    PALPATION:  TTP in anterior of left shoulder              TODAY'S TREATMENT:   05/02/22 UBE Seat and Arms 8 with resistance level 2- 5 ( 2.5 min forward and 2.5 min backward) Shoulder AAROM Flex/Ext Pulleys 3 x 10  Shoulder AAROM Abd/Add Pulleys 3 x 10            Left Shoulder Flexion with AAROM with eccentric movement #1 DB 3 x 10  Left Shoulder Abduction with AAR0M with eccentric movement #1 DB 3 x 10  -Pt applied downward overpressure to prevent pt from elevating left shoulder  Supine Left Shoulder Abduction with #1 DB 2 x 10    04/28/22 UBE Seat and Arms 8 with resistance level 2- 5 ( 2.5 min forward and 2.5 min backward) Shoulder AAROM Flex/Ext Pulleys 3 x 10  Shoulder AAROM Abd/Add Pulleys 3 x 10 Omega Seated Rows #25 3 x 10  Omega Face Pulls with #10 1 x 5  -Pt reports increased pain in left shoulder  Prone W's on LUE  -Pt unable to perform without pain  Prone T's 3 X 8 on LUE  Prone T's 1 x 8 on RUE  Prone T's 2 x 8 on RUE   04/25/22 UBE seat 9 with resistance level 2- 5 min ( 2.5 min forward and 2.5 min backward) Shoulder AAROM Flex/Ext Pulleys 3 x 10  Shoulder AAROM Abd/Add Pulleys 3 x 10  ROM   Shoulder Flex R/L 130/60*, Shoulder Abd R/L 160/70*, Shoulder ER R/L 90/40* Shoulder IR R/L  70/70   Shoulder Flex   PROM R 160*, Shoulder Abd R 150*, Shoulder ER R 50    MMT  Shoulder Flex R/L 5/4+ Shoulder Abd R/L 5/4+ Shoulder ER R/L 5/4+, Shoulder IR R/L 5/4+  Mid Trap R/L 4/4  Lower Trap R/L 4-/3-     04/19/22 UBE seat 9 with resistance level 3- 5 min  Shoulder AAROM Flex/Ext Pulleys 3 x 10  Shoulder AAROM Abd/Add Pulleys 3 x 10 Shoulder AROM Abd 3 x 10  Shoulder AROM Flex 1 x 10  Shoulder Flex with #1 DB 3 x 10  Shoulder Rows with Gray Band 3 x 10  Face Pulls with Red Band 3 x 10    04/12/22 UBE seat 9 with resistance level 3- 5 min  Shoulder AAROM Flexion/Extension 3 x 10  Shoulder AAROM Abduction/Adduction 3 x 10  Supine Shoulder AAROM Flexion/Extension #10 AW   Supine Shoulder AAROM Scaption Plane Flexion/Extension 3 x 10    Seated Shoulder Horizontal Abduction 3 x 10       PATIENT EDUCATION: Education details:  form and technique for appropriate exercise and explanation of plan of care  Person educated: Patient Education method: Explanation and Demonstration and handout  Education comprehension: verbalized understanding, returned demonstration, and verbal cues required     HOME EXERCISE PROGRAM: Access Code: [email or web address] URL: [email or web address] Date: 05/02/2022 Prepared by: Bradly Chris  Exercises - Seated Shoulder Flexion AAROM with Pulley Behind  - 1 x daily - 7 x weekly - 3 sets - 10 reps - Seated Shoulder Abduction AAROM with Pulley Behind  - 1 x daily - 7 x weekly - 3 sets - 10 reps - Supine Shoulder Flexion Extension AAROM with Dowel  - 1 x daily - 7 x weekly - 3 sets - 10 reps - Shoulder Single Arm Flexion AAROM  - 1 x daily - 7 x weekly - 3 sets - 10 reps - Wall Push Up with Plus  - 1 x daily - 3 x weekly - 3 sets - 10 reps - Seated Upper Trapezius Stretch  - 1 x daily - 7 x weekly - 1 sets - 3 reps - 60 hold - Standing Shoulder Row with Anchored Resistance  - 1 x daily - 3 x weekly - 3 sets - 10 reps - Latissimus Dorsi Stretch  at Wall  - 1 x daily - 7 x weekly - 1 sets - 3 reps - 30 hold - Sidelying Shoulder External Rotation  - 1 x daily - 3 x weekly - 3 sets - 10 reps - Standing Shoulder Flexion to 90 Degrees with Dumbbells  - 1 x daily - 3 x weekly - 3 sets - 10 reps - Prone Shoulder Horizontal Abduction  - 1 x daily - 3 x weekly - 3 sets - 8 reps - Prone W Scapular Retraction  - 1 x daily - 3 x weekly - 3 sets - 8 reps - Supine Shoulder Abduction AROM  - 1 x daily - 3 x weekly - 3 sets - 10 reps ASSESSMENT:   CLINICAL IMPRESSION:  Pt shows an improvement in shoulder strength with ability to perform resisted shoulder flexion against gravity and above 90 degrees. Pt provided mod TC to prevent pt from elevating left shoulder for compensation for RTC weakness. HEP modified to included shoulder abduction in supine or gravity eliminated position to complete resistance exercise through a great ROM. He will continue to benefit from skilled PT to increase left shoulder ROM and strength to return to using left shoulder for overhead activities.     OBJECTIVE IMPAIRMENTS decreased ROM, decreased strength, hypomobility, impaired UE functional use, and pain.    ACTIVITY LIMITATIONS carrying, lifting, dressing, and reach over head   PARTICIPATION LIMITATIONS: cleaning, occupation, and yard work   PERSONAL FACTORS Fitness, Sex, and Time since onset of injury/illness/exacerbation are also affecting patient's functional outcome.    REHAB POTENTIAL: Good   CLINICAL DECISION MAKING: Stable/uncomplicated   EVALUATION COMPLEXITY: Low     GOALS: Goals reviewed with patient? No   SHORT TERM GOALS: Target date: 03/21/2022     Pt will be independent with HEP in order to improve strength and balance in order to decrease fall risk and improve function at home and work. Baseline: Performing HEP independently  Goal status: Partially Met      LONG TERM GOALS: Target date: 05/16/2022     Patient will have improved function and  activity level as evidenced by an increase in FOTO score by 10 points or more.  Baseline: 50/65  04/07/22: 42/65 04/25/22: 38/10  Goal status: ONGOING    2.  Patient will improve left shoulder ROM to be close to symmetrical to right shoulder to return to performing overhead activities to complete job related tasks and yard work.  Baseline: Shoulder Flexion R/L 80*/60* , Shoulder Abduction R/L 80*/60*, Shoulder IR R/L 70/50*, Shoulder ER R/L 90/70* 04/25/22: Shoulder Flex R/L 130/60*, Shoulder Abd R/L 160/70*, Shoulder ER R/L 90/40* Shoulder IR R/L 70/70   Shoulder Flex PROM R 160*, Shoulder Abd R 150*, Shoulder ER R 50   Goal status: ONGOING    3.  Patient will improve shoulder MMT by >=1/2 muscle grade for improved function to return to completing job related tasks and yard work.  Baseline: Shoulder  Flex R/L 4/3-,Shoulder Abd R/L 4/3- , Shoulder IR R/L 4/4-, Shoulder ER R/L 4/4-, Lower Trap R/L 4-/3-, Mid Trap R/L 4/4 04/25/22: Shoulder Flex R/L 5/4+ Shoulder Abd R/L 5/4+ Shoulder ER R/L 5/4+, Shoulder IR R/L 5/4+  Mid Trap R/L 4/4  Lower Trap R/L 4-/3-    Goal status: ONGOING      PLAN: PT FREQUENCY: 1-2x/week   PT DURATION: 10 weeks   PLANNED INTERVENTIONS: Therapeutic exercises, Therapeutic activity, Balance training, Patient/Family education, Joint manipulation, Joint mobilization, Aquatic Therapy, Dry Needling, Electrical stimulation, Spinal manipulation, Spinal mobilization, Cryotherapy, Moist heat, Manual therapy, and Re-evaluation   PLAN FOR NEXT SESSION:  Switch parascapular and shoulder strengthening exercises with focus on decreased shoulder elevation.    [facility] [address] [place], [address] Phone: [phone]   Fax:  [phone]  Name: Joshua Cole MRN: [id number] Date of Birth: Mar 02, 1961

## 2022-05-05 ENCOUNTER — Encounter: Payer: Self-pay | Admitting: Physical Therapy

## 2022-05-05 ENCOUNTER — Ambulatory Visit: Payer: Medicaid Other | Admitting: Physical Therapy

## 2022-05-05 DIAGNOSIS — M25512 Pain in left shoulder: Secondary | ICD-10-CM

## 2022-05-05 NOTE — Therapy (Signed)
OUTPATIENT PHYSICAL THERAPY TREATMENT NOTE   Patient Name: Joshua Cole MRN: [id number] DOB:October 11, 1960, 61 y.o., male Today's Date: 05/05/2022  PCP: No PCP   REFERRING PROVIDER: Dr. Quentin Ore   END OF SESSION:   PT End of Session - 05/05/22 1025     Visit Number 13    Number of Visits 20    Date for PT Re-Evaluation 05/16/22    Authorization Type [place] [employer]    Progress Note Due on Visit 20    PT Start Time 1020    PT Stop Time 1100    PT Time Calculation (min) 40 min    Activity Tolerance Patient tolerated treatment well    Behavior During Therapy WFL for tasks assessed/performed              Past Medical History:  Diagnosis Date   Arthritis    Asthma    Diabetes mellitus without complication ([place])    type 2   DJD (degenerative joint disease)    Dysrhythmia    junctional tachycardia and incomplete heart block   Gout    Lung nodules    a. 09/2021 CT chest: Interval decrease in size and number of bilateral lung nodules, likely consistent with sequelae associated with an infectious/inflammatory process.   MSSA bacteremia 06/2021   Rotator cuff tear 08/26/2021   Subacute bacterial endocarditis (SBE)    a. 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   Past Surgical History:  Procedure Laterality Date   ACHILLES TENDON REPAIR Left    many years ago per pt   BIOPSY  07/16/2021   Procedure: BIOPSY;  Surgeon: Sharyn Creamer, MD;  Location: [facility] ENDOSCOPY;  Service: Gastroenterology;;  EGD and COLON   COLONOSCOPY N/A 07/16/2021   Procedure: COLONOSCOPY;  Surgeon: Sharyn Creamer, MD;  Location: [place];  Service: Gastroenterology;  Laterality: N/A;   COLONOSCOPY WITH PROPOFOL N/A 07/16/2021   Procedure: COLONOSCOPY WITH PROPOFOL;  Surgeon: Sharyn Creamer, MD;  Location: [place];  Service: Gastroenterology;  Laterality: N/A;   ESOPHAGOGASTRODUODENOSCOPY (EGD) WITH PROPOFOL N/A 07/16/2021   Procedure: ESOPHAGOGASTRODUODENOSCOPY (EGD) WITH PROPOFOL;  Surgeon: Sharyn Creamer, MD;  Location: [place];  Service: Gastroenterology;  Laterality: N/A;   FOOT SURGERY Right    ligaments repaired- many years ago per pt   JOINT REPLACEMENT Bilateral    hip   POLYPECTOMY  07/16/2021   Procedure: POLYPECTOMY;  Surgeon: Sharyn Creamer, MD;  Location: [facility] ENDOSCOPY;  Service: Gastroenterology;;   SHOULDER ARTHROSCOPY WITH ROTATOR CUFF REPAIR AND SUBACROMIAL DECOMPRESSION Left 02/10/2022   Procedure: LEFT SHOULDER ARTHROSCOPY WITH EXTENSIVE DEBRIDEMENT, SUBACROMIAL DECOMPRESSION;  Surgeon: Mcarthur Rossetti, MD;  Location: [place];  Service: Orthopedics;  Laterality: Left;   TEE WITHOUT CARDIOVERSION N/A 07/07/2021   Procedure: TRANSESOPHAGEAL ECHOCARDIOGRAM (TEE);  Surgeon: Kate Sable, MD;  Location: [facility];  Service: Cardiovascular;  Laterality: N/A;   Patient Active Problem List   Diagnosis Date Noted   Acid reflux 03/31/2022   Left hip pain 03/31/2022   Complete tear of left rotator cuff 02/10/2022   Headache 11/02/2021   Peripheral neuropathy 10/05/2021   Acute gout 08/26/2021   Rotator cuff tear 08/26/2021   Encounter to establish care 08/25/2021   Type 2 diabetes mellitus without complications ([place]) 63/33/5456   History of gout 08/25/2021   Left shoulder pain  08/25/2021   Duodenal ulcer    History of colonic polyps    Acute blood loss anemia    Heart block AV complete (HCC)    Abnormal LFTs    MSSA bacteremia 07/08/2021   Acute bacterial endocarditis    Heart block    Bacteremia    Hyponatremia 96/75/9163   Acute metabolic encephalopathy 84/66/5993   Joint pain 07/03/2021   Generalized weakness 07/03/2021   SIRS (systemic inflammatory response syndrome) ([place]) 07/03/2021   Hypokalemia 07/03/2021   Hyperglycemia 07/03/2021    Thrombocytopenia ([place]) 07/03/2021   Nausea vomiting and diarrhea 07/03/2021   Gout     REFERRING DIAG: Z98.890 (ICD-10-CM) - Status post arthroscopy of left shoulder   THERAPY DIAG:  Acute pain of left shoulder  Rationale for Evaluation and Treatment Rehabilitation  PERTINENT HISTORY: Per note from Dr. [name] on 02/17/22   The patient is following up at 1 week status post a left shoulder arthroscopy with subacromial decompression and extensive debridement.  We found severe synovitis in the shoulder but no rotator cuff tear.  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   I did share with him the arthroscopy pictures showing him the amount of synovitis in his shoulder.  It is essential we get him set up for outpatient physical therapy to work on any modalities that can get his shoulder moving better and decrease his pain.  Incisions look good.  The sutures have been removed.   We will see him back in 4 weeks to see how he is doing overall.  Apparently has a remote history of hip replacements and he needs to have these evaluated.  At his next visit awe will have a AP pelvis and lateral both hips.  PRECAUTIONS: None   SUBJECTIVE: Patient reports increased left hip pain since last session. His arm is experiencing increased stiffness in his left shoulder but not increased pain. His hip pain is making it more difficult to do his UE exercises.   PAIN:  Are you having pain? Yes: NPRS scale: 2-3/10 Pain location: Left anterior shoulder Pain description: Achy Aggravating factors: moving shoulder overhead  Relieving factors: nothing really helps          VITALS: BP 156/96  HR 97 SpO2 100    DIAGNOSTIC FINDINGS:  CLINICAL DATA:  Left shoulder pain   EXAM: MRI OF THE LEFT SHOULDER WITHOUT AND WITH CONTRAST    TECHNIQUE: Multiplanar, multisequence MR imaging of the left shoulder was performed before and after the administration of intravenous contrast.   CONTRAST:  63m GADAVIST GADOBUTROL 1 MMOL/ML IV SOLN   COMPARISON:  Left shoulder radiograph 07/12/2021   FINDINGS: Rotator cuff: There is a full-thickness, near full width tear of the supraspinatus tendon at the footprint (coronal T2 image 12). There is high-grade articular sided tearing of the anterior fibers of the infraspinatus tendon at the footprint (sagittal T2 image 4-6). Mild tendinosis of the cephalad fibers of the subscapularis tendon. The teres minor is intact.   Muscles: Intramuscular edema within the infraspinatus and deltoid muscles. No significant muscle atrophy.   Biceps Long Head: Intact intra and extra-articular long head biceps tendon.   Acromioclavicular Joint: Moderate-severe arthropathy of the acromioclavicular joint. There is distension of the subacromial-subdeltoid bursa with thick peripheral enhancement, short axis measuring up to 0.9 cm. (Coronal T1 post-contrast  image 12, sagittal post image 5).   Glenohumeral Joint: Trace glenohumeral joint fluid. Mild chondrosis.   Labrum: Degenerative superior labral tearing extending anteriorly and posteriorly through the biceps labral anchor.   Bones: No fracture or dislocation. No aggressive osseous lesion. No convincing marrow signal changes to suggest osteomyelitis. There is low T1 signal within the scapula, acromion, clavicle, nonspecific.   Other: No fluid collection or hematoma.   IMPRESSION: Full-thickness, near full width tear of the supraspinatus tendon at the footprint. High-grade articular sided tearing of the anterior fibers of the infraspinatus tendon at the footprint. Mild subscapularis tendinosis of the cephalad fibers. No significant muscle atrophy.   Distension of the subacromial-subdeltoid bursa with a peripheral enhancement, short axis  measuring up to 0.9 cm. While this could be related to the patient's rotator cuff tear, given adjacent reactive edema in the deltoid and clinical concern for infection, bursa aspiration should be considered.   Minimal there is minimal glenohumeral joint fluid and without adjacent marrow signal abnormality to suggest osteomyelitis. Low T1 signal within the scapula, acromion and clavicle is nonspecific, possibly related to the patient's anemia.   Degenerative superior labral tearing anteriorly and posteriorly through the biceps labral anchor.   Moderate-severe AC joint arthropathy.     Electronically Signed   By: Maurine Simmering M.D.   On: 07/14/2021 16:59   PATIENT SURVEYS:  FOTO 50/65   COGNITION:           Overall cognitive status: Within functional limits for tasks assessed                                  SENSATION: WFL   POSTURE: Slight round in shoulders    UPPER EXTREMITY ROM:         Active ROM Right eval Left eval  Shoulder flexion 80 60*  Shoulder extension 60 60  Shoulder abduction 80 60*  Shoulder adduction      Shoulder internal rotation 70 50*  Shoulder external rotation 90 70*  Elbow flexion 150 150  Elbow extension 60 60  Wrist flexion 80 80  Wrist extension 70 70  Wrist ulnar deviation 30 30  Wrist radial deviation 20 20  Wrist pronation      Wrist supination      (Blank rows = not tested)      Passive ROM Right eval Left eval  Shoulder flexion 160 180  Shoulder extension 60 60  Shoulder abduction 180  180  Shoulder adduction      Shoulder internal rotation 70 70  Shoulder external rotation 90 90  Elbow flexion 150 150  Elbow extension 60 60  Wrist flexion 80 80  Wrist extension 70 70  Wrist ulnar deviation 30 30  Wrist radial deviation 20 20  Wrist pronation      Wrist supination      (Blank rows = not tested)              UPPER EXTREMITY MMT:   MMT Right eval Left eval  Shoulder flexion 4 3-  Shoulder extension 5 5   Shoulder abduction 4 3-  Shoulder adduction      Shoulder internal rotation 4 4-  Shoulder external rotation 4 4-  Middle trapezius 4 4  Lower trapezius 4 3-  Elbow flexion 5 5  Elbow extension 5 5  Wrist flexion 5 5  Wrist extension 5 5  Wrist ulnar deviation  Wrist radial deviation      Wrist pronation      Wrist supination      Grip strength (lbs)      (Blank rows = not tested)   SHOULDER SPECIAL TESTS:            Impingement tests:  Painful Arch + L               SLAP lesions:  NT            Instability tests:  NT            Rotator cuff assessment:  Infraspinatus + L            Biceps assessment: Speed's Test: + L        JOINT MOBILITY TESTING:  Shoulder Right Inferior Glides Grade III-firm end feel  Shoulder Right AP Glides Grade III-IV - firm end feel    PALPATION:  TTP in anterior of left shoulder              TODAY'S TREATMENT:   05/05/22 UBE Seat and Arms 8 with resistance level 2- 5 ( 2.5 min forward and 2.5 min backward) Shoulder AAROM Flex/Ext Pulleys 3 x 10  Shoulder AAROM Abd/Add Pulleys 3 x 10 Upper Trap Stretch R & L 3 x 30 sec  Left Bicep Stretch 3 x 30 sec            Right Side Lying Left Shoulder Abduction #1 DB 1 x 10            Right Side Lying Left Shoulder Abduction #2 DB 2 x 10           Right Side Lying Left Shoulder ER at 0 deg abduction #2 3 x 10    05/02/22 UBE Seat and Arms 8 with resistance level 2- 5 ( 2.5 min forward and 2.5 min backward) Shoulder AAROM Flex/Ext Pulleys 3 x 10  Shoulder AAROM Abd/Add Pulleys 3 x 10            Left Shoulder Flexion with AAROM with eccentric movement #1 DB 3 x 10  Left Shoulder Abduction with AAR0M with eccentric movement #1 DB 3 x 10  -Pt applied downward overpressure to prevent pt from elevating left shoulder  Supine Left Shoulder Abduction with #1 DB 2 x 10    04/28/22 UBE Seat and Arms 8 with resistance level 2- 5 ( 2.5 min forward and 2.5 min backward) Shoulder AAROM Flex/Ext Pulleys 3 x  10  Shoulder AAROM Abd/Add Pulleys 3 x 10 Omega Seated Rows #25 3 x 10  Omega Face Pulls with #10 1 x 5  -Pt reports increased pain in left shoulder  Prone W's on LUE  -Pt unable to perform without pain  Prone T's 3 X 8 on LUE  Prone T's 1 x 8 on RUE  Prone T's 2 x 8 on RUE   04/25/22 UBE seat 9 with resistance level 2- 5 min ( 2.5 min forward and 2.5 min backward) Shoulder AAROM Flex/Ext Pulleys 3 x 10  Shoulder AAROM Abd/Add Pulleys 3 x 10  ROM   Shoulder Flex R/L 130/60*, Shoulder Abd R/L 160/70*, Shoulder ER R/L 90/40* Shoulder IR R/L 70/70   Shoulder Flex   PROM R 160*, Shoulder Abd R 150*, Shoulder ER R 50    MMT  Shoulder Flex R/L 5/4+ Shoulder Abd R/L 5/4+ Shoulder ER R/L 5/4+, Shoulder IR R/L 5/4+  Mid Trap R/L 4/4  Lower  Trap R/L 4-/3-     PATIENT EDUCATION: Education details: form and technique for appropriate exercise and explanation of plan of care  Person educated: Patient Education method: Customer service manager and handout  Education comprehension: verbalized understanding, returned demonstration, and verbal cues required     HOME EXERCISE PROGRAM: Access Code: [email or web address] URL: [email or web address] Date: 05/05/2022 Prepared by: Bradly Chris  Exercises - Seated Shoulder Flexion AAROM with Pulley Behind  - 1 x daily - 7 x weekly - 3 sets - 10 reps - Seated Shoulder Abduction AAROM with Pulley Behind  - 1 x daily - 7 x weekly - 3 sets - 10 reps - Supine Shoulder Flexion Extension AAROM with Dowel  - 1 x daily - 7 x weekly - 3 sets - 10 reps - Shoulder Single Arm Flexion AAROM  - 1 x daily - 7 x weekly - 3 sets - 10 reps - Wall Push Up with Plus  - 1 x daily - 3 x weekly - 3 sets - 10 reps - Seated Upper Trapezius Stretch  - 1 x daily - 7 x weekly - 1 sets - 3 reps - 60 hold - Standing Shoulder Row with Anchored Resistance  - 1 x daily - 3 x weekly - 3 sets - 10 reps - Latissimus Dorsi Stretch at Wall  - 1 x daily - 7 x weekly - 1 sets - 3  reps - 30 hold - Sidelying Shoulder External Rotation  - 1 x daily - 3 x weekly - 3 sets - 10 reps - Standing Shoulder Flexion to 90 Degrees with Dumbbells  - 1 x daily - 3 x weekly - 3 sets - 10 reps - Prone Shoulder Horizontal Abduction  - 1 x daily - 3 x weekly - 3 sets - 8 reps - Prone W Scapular Retraction  - 1 x daily - 3 x weekly - 3 sets - 8 reps - Standing Bicep Stretch at Wall  - 1 x daily - 7 x weekly - 3 reps - 30 hold - Sidelying Shoulder Abduction Full Range of Motion with Dumbbell  - 1 x daily - 3 x weekly - 3 sets - 10 reps  ASSESSMENT:   CLINICAL IMPRESSION:  Pt continues to exhibit an improvement in left shoulder strength with ability to perform resisted shoulder abduction against increased gravity in side lying position from supine position. He was also able to perform shoulder ER with increased resistance in side lying. He will continue to benefit from skilled PT to increase left shoulder ROM and strength to return to using left shoulder for overhead activities.     OBJECTIVE IMPAIRMENTS decreased ROM, decreased strength, hypomobility, impaired UE functional use, and pain.    ACTIVITY LIMITATIONS carrying, lifting, dressing, and reach over head   PARTICIPATION LIMITATIONS: cleaning, occupation, and yard work   PERSONAL FACTORS Fitness, Sex, and Time since onset of injury/illness/exacerbation are also affecting patient's functional outcome.    REHAB POTENTIAL: Good   CLINICAL DECISION MAKING: Stable/uncomplicated   EVALUATION COMPLEXITY: Low     GOALS: Goals reviewed with patient? No   SHORT TERM GOALS: Target date: 03/21/2022     Pt will be independent with HEP in order to improve strength and balance in order to decrease fall risk and improve function at home and work. Baseline: Performing HEP independently  Goal status: Partially Met      LONG TERM GOALS: Target date: 05/16/2022     Patient will have improved function and activity  level as evidenced by an  increase in FOTO score by 10 points or more.  Baseline: 50/65  04/07/22: 42/65 04/25/22: 57/65 Goal status: ONGOING    2.  Patient will improve left shoulder ROM to be close to symmetrical to right shoulder to return to performing overhead activities to complete job related tasks and yard work.  Baseline: Shoulder Flexion R/L 80*/60* , Shoulder Abduction R/L 80*/60*, Shoulder IR R/L 70/50*, Shoulder ER R/L 90/70* 04/25/22: Shoulder Flex R/L 130/60*, Shoulder Abd R/L 160/70*, Shoulder ER R/L 90/40* Shoulder IR R/L 70/70   Shoulder Flex PROM R 160*, Shoulder Abd R 150*, Shoulder ER R 50   Goal status: ONGOING    3.  Patient will improve shoulder MMT by >=1/2 muscle grade for improved function to return to completing job related tasks and yard work.  Baseline: Shoulder  Flex R/L 4/3-,Shoulder Abd R/L 4/3- , Shoulder IR R/L 4/4-, Shoulder ER R/L 4/4-, Lower Trap R/L 4-/3-, Mid Trap R/L 4/4 04/25/22: Shoulder Flex R/L 5/4+ Shoulder Abd R/L 5/4+ Shoulder ER R/L 5/4+, Shoulder IR R/L 5/4+  Mid Trap R/L 4/4  Lower Trap R/L 4-/3-    Goal status: ONGOING      PLAN: PT FREQUENCY: 1-2x/week   PT DURATION: 10 weeks   PLANNED INTERVENTIONS: Therapeutic exercises, Therapeutic activity, Balance training, Patient/Family education, Joint manipulation, Joint mobilization, Aquatic Therapy, Dry Needling, Electrical stimulation, Spinal manipulation, Spinal mobilization, Cryotherapy, Moist heat, Manual therapy, and Re-evaluation   PLAN FOR NEXT SESSION:  Measure shoulder flexion ROM and see difference with performing strength supine. Switch parascapular and shoulder strengthening exercises with focus on decreased shoulder elevation.    Bradly Chris PT, DPT  [facility] [address] [place], [address] Phone: [phone]   Fax:  [phone]  Name: Joshua Cole MRN: [id number] Date of Birth: [birth year]

## 2022-05-06 ENCOUNTER — Other Ambulatory Visit: Payer: Self-pay

## 2022-05-09 ENCOUNTER — Ambulatory Visit: Payer: Medicaid Other | Admitting: Physical Therapy

## 2022-05-09 ENCOUNTER — Ambulatory Visit (INDEPENDENT_AMBULATORY_CARE_PROVIDER_SITE_OTHER): Payer: Medicaid Other | Admitting: Physician Assistant

## 2022-05-09 ENCOUNTER — Other Ambulatory Visit: Payer: Self-pay

## 2022-05-09 ENCOUNTER — Encounter: Payer: Self-pay | Admitting: Physician Assistant

## 2022-05-09 ENCOUNTER — Encounter: Payer: Self-pay | Admitting: Physical Therapy

## 2022-05-09 DIAGNOSIS — Z96642 Presence of left artificial hip joint: Secondary | ICD-10-CM

## 2022-05-09 DIAGNOSIS — Z96649 Presence of unspecified artificial hip joint: Secondary | ICD-10-CM

## 2022-05-09 DIAGNOSIS — M25512 Pain in left shoulder: Secondary | ICD-10-CM | POA: Diagnosis not present

## 2022-05-09 DIAGNOSIS — T8459XS Infection and inflammatory reaction due to other internal joint prosthesis, sequela: Secondary | ICD-10-CM | POA: Diagnosis not present

## 2022-05-09 MED ORDER — TRAMADOL HCL 50 MG PO TABS
50.0000 mg | ORAL_TABLET | Freq: Four times a day (QID) | ORAL | 0 refills | Status: DC | PRN
  Filled 2022-05-09: qty 30, 8d supply, fill #0

## 2022-05-09 NOTE — Therapy (Signed)
OUTPATIENT PHYSICAL THERAPY TREATMENT NOTE   Patient Name: Joshua Cole MRN: [id number] DOB:10/09/1960, 61 y.o., male Today's Date: 05/09/2022  PCP: No PCP   REFERRING PROVIDER: Dr. Quentin Ore   END OF SESSION:   PT End of Session - 05/09/22 0939     Visit Number 14    Number of Visits 20    Date for PT Re-Evaluation 05/16/22    Authorization Type [employer]    Progress Note Due on Visit 20    PT Start Time 0935    PT Stop Time 1015    PT Time Calculation (min) 40 min    Activity Tolerance Patient tolerated treatment well    Behavior During Therapy [facility] for tasks assessed/performed              Past Medical History:  Diagnosis Date   Arthritis    Asthma    Chronic kidney disease    Diabetes mellitus without complication ([place])    type 2   DJD (degenerative joint disease)    Dysrhythmia    junctional tachycardia and incomplete heart block   Gout    Lung nodules    a. 09/2021 CT chest: Interval decrease in size and number of bilateral lung nodules, likely consistent with sequelae associated with an infectious/inflammatory process.   MSSA bacteremia 06/2021   Rotator cuff tear 08/26/2021   Subacute bacterial endocarditis (SBE)    a. 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   Past Surgical History:  Procedure Laterality Date   ACHILLES TENDON REPAIR Left    many years ago per pt   BIOPSY  07/16/2021   Procedure: BIOPSY;  Surgeon: DSharyn Creamer MD;  [facility]  Service: Gastroenterology;;  EGD and COLON   COLONOSCOPY N/A 07/16/2021   Procedure: COLONOSCOPY;  Surgeon: DSharyn Creamer MD;  [place] [facility]  Service: Gastroenterology;  Laterality: N/A;   COLONOSCOPY WITH PROPOFOL N/A 07/16/2021   Procedure: COLONOSCOPY WITH PROPOFOL;  Surgeon: DSharyn Creamer MD;  [place]  Service: Gastroenterology;  Laterality: N/A;   ESOPHAGOGASTRODUODENOSCOPY (EGD) WITH PROPOFOL N/A 07/16/2021   Procedure: ESOPHAGOGASTRODUODENOSCOPY (EGD) WITH PROPOFOL;  Surgeon: DSharyn Creamer MD;  [place] [facility]  Service: Gastroenterology;  Laterality: N/A;   FOOT SURGERY Right    ligaments repaired- many years ago per pt   JOINT REPLACEMENT Bilateral    hip   POLYPECTOMY  07/16/2021   Procedure: POLYPECTOMY;  Surgeon: DSharyn Creamer MD;  [facility];  Service: Gastroenterology;;   SHOULDER ARTHROSCOPY WITH ROTATOR CUFF REPAIR AND SUBACROMIAL DECOMPRESSION Left 02/10/2022   Procedure: LEFT SHOULDER ARTHROSCOPY WITH EXTENSIVE DEBRIDEMENT, SUBACROMIAL DECOMPRESSION;  Surgeon: BMcarthur Rossetti MD;  [place] [facility]  Service: Orthopedics;  Laterality: Left;   TEE WITHOUT CARDIOVERSION N/A 07/07/2021   Procedure: TRANSESOPHAGEAL ECHOCARDIOGRAM (TEE);  Surgeon: AKate Sable MD;  Location: [facility];  Service: Cardiovascular;  Laterality: N/A;   Patient Active Problem List   Diagnosis Date Noted   Acid reflux 03/31/2022   Left hip pain 03/31/2022   Complete tear of left rotator cuff 02/10/2022   Headache 11/02/2021   Peripheral neuropathy 10/05/2021   Acute gout 08/26/2021   Rotator cuff tear 08/26/2021   Encounter to establish care 08/25/2021   Type 2 diabetes mellitus without complications ([place] 132/44/0102  History of gout  08/25/2021   Left shoulder pain 08/25/2021   Duodenal ulcer    History of colonic polyps    Acute blood loss anemia    Heart block AV complete (HCC)    Abnormal LFTs    MSSA bacteremia 07/08/2021   Acute bacterial endocarditis    Heart block    Bacteremia    Hyponatremia 73/42/8768   Acute metabolic encephalopathy 11/57/2620   Joint pain 07/03/2021   Generalized weakness 07/03/2021   SIRS (systemic inflammatory response syndrome) ([place]) 07/03/2021   Hypokalemia 07/03/2021   Hyperglycemia  07/03/2021   Thrombocytopenia ([place]) 07/03/2021   Nausea vomiting and diarrhea 07/03/2021   Gout     REFERRING DIAG: Z98.890 (ICD-10-CM) - Status post arthroscopy of left shoulder   THERAPY DIAG:  Acute pain of left shoulder  Rationale for Evaluation and Treatment Rehabilitation  PERTINENT HISTORY: Per note from Dr. [name] on 02/17/22   The patient is following up at 1 week status post a left shoulder arthroscopy with subacromial decompression and extensive debridement.  We found severe synovitis in the shoulder but no rotator cuff tear.  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   I did share with him the arthroscopy pictures showing him the amount of synovitis in his shoulder.  It is essential we get him set up for outpatient physical therapy to work on any modalities that can get his shoulder moving better and decrease his pain.  Incisions look good.  The sutures have been removed.   We will see him back in 4 weeks to see how he is doing overall.  Apparently has a remote history of hip replacements and he needs to have these evaluated.  At his next visit awe will have a AP pelvis and lateral both hips.  PRECAUTIONS: None   SUBJECTIVE: Patient reports that his shoulder is feeling better since last visit and that his shoulder strength is feeling improved.  PAIN:  Are you having pain? Yes: NPRS scale: 2-3/10 Pain location: Left anterior shoulder Pain description: Achy Aggravating factors: moving shoulder overhead  Relieving factors: nothing really helps          VITALS: BP 156/96  HR 97 SpO2 100    DIAGNOSTIC FINDINGS:  CLINICAL DATA:  Left shoulder pain   EXAM: MRI OF THE LEFT SHOULDER WITHOUT AND WITH CONTRAST   TECHNIQUE: Multiplanar, multisequence MR imaging of the left shoulder was performed before and  after the administration of intravenous contrast.   CONTRAST:  2m GADAVIST GADOBUTROL 1 MMOL/ML IV SOLN   COMPARISON:  Left shoulder radiograph 07/12/2021   FINDINGS: Rotator cuff: There is a full-thickness, near full width tear of the supraspinatus tendon at the footprint (coronal T2 image 12). There is high-grade articular sided tearing of the anterior fibers of the infraspinatus tendon at the footprint (sagittal T2 image 4-6). Mild tendinosis of the cephalad fibers of the subscapularis tendon. The teres minor is intact.   Muscles: Intramuscular edema within the infraspinatus and deltoid muscles. No significant muscle atrophy.   Biceps Long Head: Intact intra and extra-articular long head biceps tendon.   Acromioclavicular Joint: Moderate-severe arthropathy of the acromioclavicular joint. There is distension of the subacromial-subdeltoid bursa with thick peripheral enhancement, short axis measuring up to 0.9 cm. (Coronal T1 post-contrast image 12, sagittal post image 5).   Glenohumeral Joint: Trace glenohumeral  joint fluid. Mild chondrosis.   Labrum: Degenerative superior labral tearing extending anteriorly and posteriorly through the biceps labral anchor.   Bones: No fracture or dislocation. No aggressive osseous lesion. No convincing marrow signal changes to suggest osteomyelitis. There is low T1 signal within the scapula, acromion, clavicle, nonspecific.   Other: No fluid collection or hematoma.   IMPRESSION: Full-thickness, near full width tear of the supraspinatus tendon at the footprint. High-grade articular sided tearing of the anterior fibers of the infraspinatus tendon at the footprint. Mild subscapularis tendinosis of the cephalad fibers. No significant muscle atrophy.   Distension of the subacromial-subdeltoid bursa with a peripheral enhancement, short axis measuring up to 0.9 cm. While this could be related to the patient's rotator cuff tear, given  adjacent reactive edema in the deltoid and clinical concern for infection, bursa aspiration should be considered.   Minimal there is minimal glenohumeral joint fluid and without adjacent marrow signal abnormality to suggest osteomyelitis. Low T1 signal within the scapula, acromion and clavicle is nonspecific, possibly related to the patient's anemia.   Degenerative superior labral tearing anteriorly and posteriorly through the biceps labral anchor.   Moderate-severe AC joint arthropathy.     Electronically Signed   By: Maurine Simmering M.D.   On: 07/14/2021 16:59   PATIENT SURVEYS:  FOTO 50/65   COGNITION:           Overall cognitive status: Within functional limits for tasks assessed                                  SENSATION: WFL   POSTURE: Slight round in shoulders    UPPER EXTREMITY ROM:         Active ROM Right eval Left eval  Shoulder flexion 80 60*  Shoulder extension 60 60  Shoulder abduction 80 60*  Shoulder adduction      Shoulder internal rotation 70 50*  Shoulder external rotation 90 70*  Elbow flexion 150 150  Elbow extension 60 60  Wrist flexion 80 80  Wrist extension 70 70  Wrist ulnar deviation 30 30  Wrist radial deviation 20 20  Wrist pronation      Wrist supination      (Blank rows = not tested)      Passive ROM Right eval Left eval  Shoulder flexion 160 180  Shoulder extension 60 60  Shoulder abduction 180  180  Shoulder adduction      Shoulder internal rotation 70 70  Shoulder external rotation 90 90  Elbow flexion 150 150  Elbow extension 60 60  Wrist flexion 80 80  Wrist extension 70 70  Wrist ulnar deviation 30 30  Wrist radial deviation 20 20  Wrist pronation      Wrist supination      (Blank rows = not tested)              UPPER EXTREMITY MMT:   MMT Right eval Left eval  Shoulder flexion 4 3-  Shoulder extension 5 5  Shoulder abduction 4 3-  Shoulder adduction      Shoulder internal rotation 4 4-  Shoulder  external rotation 4 4-  Middle trapezius 4 4  Lower trapezius 4 3-  Elbow flexion 5 5  Elbow extension 5 5  Wrist flexion 5 5  Wrist extension 5 5  Wrist ulnar deviation      Wrist radial deviation      Wrist  pronation      Wrist supination      Grip strength (lbs)      (Blank rows = not tested)   SHOULDER SPECIAL TESTS:            Impingement tests:  Painful Arch + L               SLAP lesions:  NT            Instability tests:  NT            Rotator cuff assessment:  Infraspinatus + L            Biceps assessment: Speed's Test: + L        JOINT MOBILITY TESTING:  Shoulder Right Inferior Glides Grade III-firm end feel  Shoulder Right AP Glides Grade III-IV - firm end feel    PALPATION:  TTP in anterior of left shoulder              TODAY'S TREATMENT:   05/09/22  UBE Seat and Arms 11 with resistance level 2- 5 (2.5 min forward and 2.5 backward) Shoulder AAROM Flex/Ext Pulleys 3 x 10  Shoulder AAROM Abd/Add Pulleys 3 x 10 Seated LUE forward flexion #1 DB 1 x 10  Supine Shoulder AAROM Flexion with #3 1 x 10  Supine Shoulder AAROM Flexion with #5 1 x 10  -NPS 7/10 in LUE  Supine Shoulder LUE Flexion with #4 1 x 10  Sky Punches on LUE #4 2 x 10  -min VC to maintain left elbow extension throughout exercise  Sky Punches on LUE #5 1 x 10  Bent over single row with #5 DB on LUE 1 x 10  Bent over single row with black TB on LUE 1 x 10  05/05/22 UBE Seat and Arms 8 with resistance level 2- 5 ( 2.5 min forward and 2.5 min backward) Shoulder AAROM Flex/Ext Pulleys 3 x 10  Shoulder AAROM Abd/Add Pulleys 3 x 10 Upper Trap Stretch R & L 3 x 30 sec  Left Bicep Stretch 3 x 30 sec            Right Side Lying Left Shoulder Abduction #1 DB 1 x 10            Right Side Lying Left Shoulder Abduction #2 DB 2 x 10           Right Side Lying Left Shoulder ER at 0 deg abduction #2 3 x 10    05/02/22 UBE Seat and Arms 8 with resistance level 2- 5 ( 2.5 min forward and 2.5 min  backward) Shoulder AAROM Flex/Ext Pulleys 3 x 10  Shoulder AAROM Abd/Add Pulleys 3 x 10            Left Shoulder Flexion with AAROM with eccentric movement #1 DB 3 x 10  Left Shoulder Abduction with AAR0M with eccentric movement #1 DB 3 x 10  -Pt applied downward overpressure to prevent pt from elevating left shoulder  Supine Left Shoulder Abduction with #1 DB 2 x 10     PATIENT EDUCATION: Education details: form and technique for appropriate exercise and explanation of plan of care  Person educated: Patient Education method: Customer service manager and handout  Education comprehension: verbalized understanding, returned demonstration, and verbal cues required     HOME EXERCISE PROGRAM: Access Code: [email or web address] URL: [email or web address] Date: 05/05/2022 Prepared by: Bradly Chris  Exercises - Seated Shoulder Flexion AAROM with Pulley Behind  - 1 x  daily - 7 x weekly - 3 sets - 10 reps - Seated Shoulder Abduction AAROM with Pulley Behind  - 1 x daily - 7 x weekly - 3 sets - 10 reps - Supine Shoulder Flexion Extension AAROM with Dowel  - 1 x daily - 7 x weekly - 3 sets - 10 reps - Shoulder Single Arm Flexion AAROM  - 1 x daily - 7 x weekly - 3 sets - 10 reps - Wall Push Up with Plus  - 1 x daily - 3 x weekly - 3 sets - 10 reps - Seated Upper Trapezius Stretch  - 1 x daily - 7 x weekly - 1 sets - 3 reps - 60 hold - Standing Shoulder Row with Anchored Resistance  - 1 x daily - 3 x weekly - 3 sets - 10 reps - Latissimus Dorsi Stretch at Wall  - 1 x daily - 7 x weekly - 1 sets - 3 reps - 30 hold - Sidelying Shoulder External Rotation  - 1 x daily - 3 x weekly - 3 sets - 10 reps - Standing Shoulder Flexion to 90 Degrees with Dumbbells  - 1 x daily - 3 x weekly - 3 sets - 10 reps - Prone Shoulder Horizontal Abduction  - 1 x daily - 3 x weekly - 3 sets - 8 reps - Prone W Scapular Retraction  - 1 x daily - 3 x weekly - 3 sets - 8 reps - Standing Bicep Stretch at Wall  - 1  x daily - 7 x weekly - 3 reps - 30 hold - Sidelying Shoulder Abduction Full Range of Motion with Dumbbell  - 1 x daily - 3 x weekly - 3 sets - 10 reps  ASSESSMENT:   CLINICAL IMPRESSION:  Pt continues to be limited with AROM in left shoulder flexion. Exercises modified to include left shoulder flexion in supine for improved strengthening over larger ROM and pt did not experience a significant increase in his pain. Parascapular exercises progressed to with change gravity neutral to against gravity positions for bend over rows and sky punches. He will continue to benefit from skilled PT to increase left shoulder ROM and strength to return to using left shoulder for overhead activities.   OBJECTIVE IMPAIRMENTS decreased ROM, decreased strength, hypomobility, impaired UE functional use, and pain.    ACTIVITY LIMITATIONS carrying, lifting, dressing, and reach over head   PARTICIPATION LIMITATIONS: cleaning, occupation, and yard work   PERSONAL FACTORS Fitness, Sex, and Time since onset of injury/illness/exacerbation are also affecting patient's functional outcome.    REHAB POTENTIAL: Good   CLINICAL DECISION MAKING: Stable/uncomplicated   EVALUATION COMPLEXITY: Low     GOALS: Goals reviewed with patient? No   SHORT TERM GOALS: Target date: 03/21/2022     Pt will be independent with HEP in order to improve strength and balance in order to decrease fall risk and improve function at home and work. Baseline: Performing HEP independently  Goal status: Partially Met      LONG TERM GOALS: Target date: 05/16/2022     Patient will have improved function and activity level as evidenced by an increase in FOTO score by 10 points or more.  Baseline: 50/65  04/07/22: 42/65 04/25/22: 57/65 Goal status: ONGOING    2.  Patient will improve left shoulder ROM to be close to symmetrical to right shoulder to return to performing overhead activities to complete job related tasks and yard work.  Baseline:  Shoulder Flexion R/L  80*/60* , Shoulder Abduction R/L 80*/60*, Shoulder IR R/L 70/50*, Shoulder ER R/L 90/70* 04/25/22: Shoulder Flex R/L 130/60*, Shoulder Abd R/L 160/70*, Shoulder ER R/L 90/40* Shoulder IR R/L 70/70   Shoulder Flex PROM R 160*, Shoulder Abd R 150*, Shoulder ER R 50   Goal status: ONGOING    3.  Patient will improve shoulder MMT by >=1/2 muscle grade for improved function to return to completing job related tasks and yard work.  Baseline: Shoulder  Flex R/L 4/3-,Shoulder Abd R/L 4/3- , Shoulder IR R/L 4/4-, Shoulder ER R/L 4/4-, Lower Trap R/L 4-/3-, Mid Trap R/L 4/4 04/25/22: Shoulder Flex R/L 5/4+ Shoulder Abd R/L 5/4+ Shoulder ER R/L 5/4+, Shoulder IR R/L 5/4+  Mid Trap R/L 4/4  Lower Trap R/L 4-/3-    Goal status: ONGOING      PLAN: PT FREQUENCY: 1-2x/week   PT DURATION: 10 weeks   PLANNED INTERVENTIONS: Therapeutic exercises, Therapeutic activity, Balance training, Patient/Family education, Joint manipulation, Joint mobilization, Aquatic Therapy, Dry Needling, Electrical stimulation, Spinal manipulation, Spinal mobilization, Cryotherapy, Moist heat, Manual therapy, and Re-evaluation   PLAN FOR NEXT SESSION:  Reassess goals. Measure shoulder flexion ROM and see difference with performing strength supine. Switch parascapular and shoulder strengthening exercises with focus on decreased shoulder elevation.    Bradly Chris PT, DPT  [facility] [address], [place], [address] Phone: [phone]   Fax:  [phone]  Name: Joshua Cole MRN: [id number] Date of Birth: 1961/04/27

## 2022-05-09 NOTE — Progress Notes (Signed)
HPI: [name] returns today to go over the lab results from the left hip aspirate.  He states overall he is doing good but having significant pain in the left hip most days.  He denies any knee injury or pain.  He is taking Tylenol taken for 5 g/day due to the fact that he is unable to take NSAIDs.  He denies any fevers, nausea, vomiting, chills, chest pain, shortness of breath. He states generally his diabetes is under good control however notes that he did have 1 reading recently that was 208.  He notes that he is no longer drinking the nonalcohol.  He does state that he had some alcohol over the weekend and drank 3 beers.  Prior to that he states he has had no alcohol since his discharge from the hospital in November 2022. Left hip aspirate was positive for Staph aureus white count was 270,760.  Review of systems: See HPI otherwise negative or noncontributory.  Physical exam: General well-developed well-nourished male in no acute distress ambulates without any assistive device Left hip painful range of motion.  Surgical incisions posterior no signs of erythema or drainage or signs of infection.  Impression: Left total hip arthroplasty infection  Plan: Recommend excision arthroplasty.  We will work on scheduling this in the next 2 weeks.  Call us if his condition becomes worse with any fevers, chills, nausea or vomiting.  Discussed risk benefits of surgery with him at length.  Discussed postoperative protocol.  Risk includes but is not limited to DVT bone destruction, wound healing worsening pain, prolonged pain, blood loss, nerve or vascular injury

## 2022-05-10 ENCOUNTER — Other Ambulatory Visit: Payer: Self-pay

## 2022-05-10 ENCOUNTER — Telehealth: Payer: Self-pay | Admitting: Cardiology

## 2022-05-10 NOTE — Telephone Encounter (Signed)
   Pre-operative Risk Assessment    Patient Name: Joshua Cole  DOB: 1961/01/02 MRN: [id number]      Request for Surgical Clearance    Procedure:   EXCISION LEFT TOTALHIP ARTHROPLASTY  Date of Surgery:  Clearance TBD                               Surgeon:  DR Jean Rosenthal Surgeon's Group or Practice Name:  [facility]  Phone number:  [phone] Fax number:  [phone]  Type of Clearance Requested:   - Medical    Type of Anesthesia:  General    Additional requests/questions:    Signed, Eli Phillips   05/10/2022, 3:52 PM

## 2022-05-11 ENCOUNTER — Telehealth: Payer: Self-pay | Admitting: *Deleted

## 2022-05-11 NOTE — Telephone Encounter (Signed)
Pt has been scheduled for a tele visit, 05/12/22 1:40.

## 2022-05-11 NOTE — Telephone Encounter (Signed)
Pt has been scheduled for a tele visit, 05/12/22 1:40.  Consent on file / medications reconciled.    Patient Consent for Virtual Visit        Joshua Cole has provided verbal consent on 05/11/2022 for a virtual visit (video or telephone).   CONSENT FOR VIRTUAL VISIT FOR:  Joshua Cole  By participating in this virtual visit I agree to the following:  I hereby voluntarily request, consent and authorize [facility] and its employed or contracted physicians, physician assistants, nurse practitioners or other licensed health care professionals (the Practitioner), to provide me with telemedicine health care services (the "Services") as deemed necessary by the treating Practitioner. I acknowledge and consent to receive the Services by the Practitioner via telemedicine. I understand that the telemedicine visit will involve communicating with the Practitioner through live audiovisual communication technology and the disclosure of certain medical information by electronic transmission. I acknowledge that I have been given the opportunity to request an in-person assessment or other available alternative prior to the telemedicine visit and am voluntarily participating in the telemedicine visit.  I understand that I have the right to withhold or withdraw my consent to the use of telemedicine in the course of my care at any time, without affecting my right to future care or treatment, and that the Practitioner or I may terminate the telemedicine visit at any time. I understand that I have the right to inspect all information obtained and/or recorded in the course of the telemedicine visit and may receive copies of available information for a reasonable fee.  I understand that some of the potential risks of receiving the Services via telemedicine include:  Delay or interruption in medical evaluation due to technological equipment failure or disruption; Information transmitted may not be  sufficient (e.g. poor resolution of images) to allow for appropriate medical decision making by the Practitioner; and/or  In rare instances, security protocols could fail, causing a breach of personal health information.  Furthermore, I acknowledge that it is my responsibility to provide information about my medical history, conditions and care that is complete and accurate to the best of my ability. I acknowledge that Practitioner's advice, recommendations, and/or decision may be based on factors not within their control, such as incomplete or inaccurate data provided by me or distortions of diagnostic images or specimens that may result from electronic transmissions. I understand that the practice of medicine is not an exact science and that Practitioner makes no warranties or guarantees regarding treatment outcomes. I acknowledge that a copy of this consent can be made available to me via my patient portal ([place]), or I can request a printed copy by calling the office of [facility].    I understand that my insurance will be billed for this visit.   I have read or had this consent read to me. I understand the contents of this consent, which adequately explains the benefits and risks of the Services being provided via telemedicine.  I have been provided ample opportunity to ask questions regarding this consent and the Services and have had my questions answered to my satisfaction. I give my informed consent for the services to be provided through the use of telemedicine in my medical care  \

## 2022-05-11 NOTE — Telephone Encounter (Signed)
   Name: Joshua Cole  DOB: 1961/09/01  MRN: [id number]  Primary Cardiologist: Kathlyn Sacramento, MD   Preoperative team, please contact this patient and set up a phone call appointment for further preoperative risk assessment. Please obtain consent and complete medication review. Thank you for your help.  I confirm that guidance regarding antiplatelet and oral anticoagulation therapy has been completed and, if necessary, noted below. -No request to hold meds  Richardson Dopp, PA-C 05/11/2022, 1:04 PM [place]

## 2022-05-12 ENCOUNTER — Encounter: Payer: Self-pay | Admitting: Physical Therapy

## 2022-05-12 ENCOUNTER — Ambulatory Visit: Payer: Medicaid Other | Admitting: Physical Therapy

## 2022-05-12 ENCOUNTER — Ambulatory Visit: Payer: Medicaid Other | Attending: Physician Assistant | Admitting: Physician Assistant

## 2022-05-12 DIAGNOSIS — M25512 Pain in left shoulder: Secondary | ICD-10-CM | POA: Diagnosis not present

## 2022-05-12 DIAGNOSIS — Z01810 Encounter for preprocedural cardiovascular examination: Secondary | ICD-10-CM | POA: Diagnosis not present

## 2022-05-12 NOTE — Therapy (Addendum)
OUTPATIENT PHYSICAL THERAPY TREATMENT NOTE/ Re-certification  Dates of Reporting: 03/07/22 -05/16/22  Patient Name: Joshua Cole MRN: [id number] DOB:02/27/1961, 60 y.o., male Today's Date: 05/12/2022  PCP: No PCP   REFERRING PROVIDER: Dr. Lambert   END OF SESSION:   PT End of Session - 05/12/22 1106     Visit Number 15    Number of Visits 20    Date for PT Re-Evaluation 05/16/22    Authorization Type [place] [employer]    Progress Note Due on Visit 20    PT Start Time 1100    PT Stop Time 1145    PT Time Calculation (min) 45 min    Activity Tolerance Patient tolerated treatment well    Behavior During Therapy WFL for tasks assessed/performed              Past Medical History:  Diagnosis Date   Arthritis    Asthma    Chronic kidney disease    Diabetes mellitus without complication (HCC)    type 2   DJD (degenerative joint disease)    Dysrhythmia    junctional tachycardia and incomplete heart block   Gout    Lung nodules    a. 09/2021 CT chest: Interval decrease in size and number of bilateral lung nodules, likely consistent with sequelae associated with an infectious/inflammatory process.   MSSA bacteremia 06/2021   Rotator cuff tear 08/26/2021   Subacute bacterial endocarditis (SBE)    a. 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   Past Surgical History:  Procedure Laterality Date   ACHILLES TENDON REPAIR Left    many years ago per pt   BIOPSY  07/16/2021   Procedure: BIOPSY;  Surgeon: Dorsey, Ying C, MD;  Location: [facility];  Service: Gastroenterology;;  EGD and COLON   COLONOSCOPY N/A 07/16/2021   Procedure: COLONOSCOPY;  Surgeon: Dorsey, Ying C, MD;  Location: [facility];  Service: Gastroenterology;  Laterality: N/A;   COLONOSCOPY WITH PROPOFOL N/A 07/16/2021    Procedure: COLONOSCOPY WITH PROPOFOL;  Surgeon: Dorsey, Ying C, MD;  Location: [facility];  Service: Gastroenterology;  Laterality: N/A;   ESOPHAGOGASTRODUODENOSCOPY (EGD) WITH PROPOFOL N/A 07/16/2021   Procedure: ESOPHAGOGASTRODUODENOSCOPY (EGD) WITH PROPOFOL;  Surgeon: Dorsey, Ying C, MD;  Location: [facility];  Service: Gastroenterology;  Laterality: N/A;   FOOT SURGERY Right    ligaments repaired- many years ago per pt   JOINT REPLACEMENT Bilateral    hip   POLYPECTOMY  07/16/2021   Procedure: POLYPECTOMY;  Surgeon: Dorsey, Ying C, MD;  Location: [facility];  Service: Gastroenterology;;   SHOULDER ARTHROSCOPY WITH ROTATOR CUFF REPAIR AND SUBACROMIAL DECOMPRESSION Left 02/10/2022   Procedure: LEFT SHOULDER ARTHROSCOPY WITH EXTENSIVE DEBRIDEMENT, SUBACROMIAL DECOMPRESSION;  Surgeon: Blackman, Christopher Y, MD;  Location: [facility];  Service: Orthopedics;  Laterality: Left;   TEE WITHOUT CARDIOVERSION N/A 07/07/2021   Procedure: TRANSESOPHAGEAL ECHOCARDIOGRAM (TEE);  Surgeon: Agbor-Etang, Brian, MD;  Location: [facility];  Service: Cardiovascular;  Laterality: N/A;   Patient Active Problem List   Diagnosis Date Noted   Acid reflux 03/31/2022   Left hip pain 03/31/2022   Complete tear of left rotator cuff 02/10/2022   Headache 11/02/2021   Peripheral neuropathy 10/05/2021   Acute gout 08/26/2021   Rotator cuff tear 08/26/2021   Encounter to establish care 08/25/2021   Type 2 diabetes mellitus without complications (HCC)   08/25/2021   History of gout 08/25/2021   Left shoulder pain 08/25/2021   Duodenal ulcer    History of colonic polyps    Acute blood loss anemia    Heart block AV complete (HCC)    Abnormal LFTs    MSSA bacteremia 07/08/2021   Acute bacterial endocarditis    Heart block    Bacteremia    Hyponatremia 74/14/2395   Acute metabolic encephalopathy 32/10/3341   Joint pain 07/03/2021   Generalized weakness 07/03/2021   SIRS (systemic inflammatory response syndrome)  ([place]) 07/03/2021   Hypokalemia 07/03/2021   Hyperglycemia 07/03/2021   Thrombocytopenia ([place]) 07/03/2021   Nausea vomiting and diarrhea 07/03/2021   Gout     REFERRING DIAG: Z98.890 (ICD-10-CM) - Status post arthroscopy of left shoulder   THERAPY DIAG:  Acute pain of left shoulder  Rationale for Evaluation and Treatment Rehabilitation  PERTINENT HISTORY: Per note from Dr. [name] on 02/17/22   The patient is following up at 1 week status post a left shoulder arthroscopy with subacromial decompression and extensive debridement.  We found severe synovitis in the shoulder but no rotator cuff tear.  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   I did share with him the arthroscopy pictures showing him the amount of synovitis in his shoulder.  It is essential we get him set up for outpatient physical therapy to work on any modalities that can get his shoulder moving better and decrease his pain.  Incisions look good.  The sutures have been removed.   We will see him back in 4 weeks to see how he is doing overall.  Apparently has a remote history of hip replacements and he needs to have these evaluated.  At his next visit awe will have a AP pelvis and lateral both hips.  PRECAUTIONS: None   SUBJECTIVE: Pt reports his left shoulder has been feeling much better since last visit. He was recently prescribed tramadol for his left hip which has helped significantly with the pain. He will be getting a left hip surgery at the end of next week to remove hip replacement.    PAIN:  Are you having pain? Yes: NPRS scale: 1-2/10 Pain location: Left anterior shoulder Pain description: Achy Aggravating factors: moving shoulder overhead  Relieving factors: nothing really helps    OBJECTIVE:         VITALS: BP 156/96  HR 97 SpO2 100     DIAGNOSTIC FINDINGS:  CLINICAL DATA:  Left shoulder pain   EXAM: MRI OF THE LEFT SHOULDER WITHOUT AND WITH CONTRAST   TECHNIQUE: Multiplanar, multisequence MR imaging of the left shoulder was performed before and after the administration of intravenous contrast.   CONTRAST:  70m GADAVIST GADOBUTROL 1 MMOL/ML IV SOLN   COMPARISON:  Left shoulder radiograph 07/12/2021   FINDINGS: Rotator cuff: There is a full-thickness, near full width tear of the supraspinatus tendon at the footprint (coronal T2 image 12). There is high-grade articular sided tearing of the anterior fibers of the infraspinatus tendon at the footprint (sagittal T2 image 4-6). Mild tendinosis of the cephalad fibers of the subscapularis tendon. The teres minor is intact.   Muscles: Intramuscular edema within the infraspinatus and deltoid muscles. No significant muscle atrophy.   Biceps Long Head: Intact intra and extra-articular long head biceps tendon.   Acromioclavicular Joint:  Moderate-severe arthropathy of the acromioclavicular joint. There is distension of the subacromial-subdeltoid bursa with thick peripheral enhancement, short axis measuring up to 0.9 cm. (Coronal T1 post-contrast image 12, sagittal post image 5).   Glenohumeral Joint: Trace glenohumeral joint fluid. Mild chondrosis.   Labrum: Degenerative superior labral tearing extending anteriorly and posteriorly through the biceps labral anchor.   Bones: No fracture or dislocation. No aggressive osseous lesion. No convincing marrow signal changes to suggest osteomyelitis. There is low T1 signal within the scapula, acromion, clavicle, nonspecific.   Other: No fluid collection or hematoma.   IMPRESSION: Full-thickness, near full width tear of the supraspinatus tendon at the footprint. High-grade articular sided tearing of the anterior fibers of the infraspinatus tendon at the footprint. Mild subscapularis tendinosis of the cephalad fibers. No  significant muscle atrophy.   Distension of the subacromial-subdeltoid bursa with a peripheral enhancement, short axis measuring up to 0.9 cm. While this could be related to the patient's rotator cuff tear, given adjacent reactive edema in the deltoid and clinical concern for infection, bursa aspiration should be considered.   Minimal there is minimal glenohumeral joint fluid and without adjacent marrow signal abnormality to suggest osteomyelitis. Low T1 signal within the scapula, acromion and clavicle is nonspecific, possibly related to the patient's anemia.   Degenerative superior labral tearing anteriorly and posteriorly through the biceps labral anchor.   Moderate-severe AC joint arthropathy.     Electronically Signed   By: Jacob  Kahn M.D.   On: 07/14/2021 16:59   PATIENT SURVEYS:  FOTO 50/65   COGNITION:           Overall cognitive status: Within functional limits for tasks assessed                                  SENSATION: WFL   POSTURE: Slight round in shoulders    UPPER EXTREMITY ROM:         Active ROM Right eval Left eval  Shoulder flexion 80 60*  Shoulder extension 60 60  Shoulder abduction 80 60*  Shoulder adduction      Shoulder internal rotation 70 50*  Shoulder external rotation 90 70*  Elbow flexion 150 150  Elbow extension 60 60  Wrist flexion 80 80  Wrist extension 70 70  Wrist ulnar deviation 30 30  Wrist radial deviation 20 20  Wrist pronation      Wrist supination      (Blank rows = not tested)      Passive ROM Right eval Left eval  Shoulder flexion 160 180  Shoulder extension 60 60  Shoulder abduction 180  180  Shoulder adduction      Shoulder internal rotation 70 70  Shoulder external rotation 90 90  Elbow flexion 150 150  Elbow extension 60 60  Wrist flexion 80 80  Wrist extension 70 70  Wrist ulnar deviation 30 30  Wrist radial deviation 20 20  Wrist pronation      Wrist supination      (Blank rows = not  tested)              UPPER EXTREMITY MMT:   MMT Right eval Left eval  Shoulder flexion 4 3-  Shoulder extension 5 5  Shoulder abduction 4 3-  Shoulder adduction      Shoulder internal rotation 4 4-  Shoulder external rotation 4 4-  Middle trapezius 4 4  Lower trapezius 4   3-  Elbow flexion 5 5  Elbow extension 5 5  Wrist flexion 5 5  Wrist extension 5 5  Wrist ulnar deviation      Wrist radial deviation      Wrist pronation      Wrist supination      Grip strength (lbs)      (Blank rows = not tested)   SHOULDER SPECIAL TESTS:            Impingement tests:  Painful Arch + L               SLAP lesions:  NT            Instability tests:  NT            Rotator cuff assessment:  Infraspinatus + L            Biceps assessment: Speed's Test: + L        JOINT MOBILITY TESTING:  Shoulder Right Inferior Glides Grade III-firm end feel  Shoulder Right AP Glides Grade III-IV - firm end feel    PALPATION:  TTP in anterior of left shoulder              TODAY'S TREATMENT:   05/12/22 UBE Seat and Arms 11 with resistance level 2- 5 (2.5 min forward and 2.5 backward) Shoulder AAROM Flex/Ext Pulleys 3 x 10  Shoulder AAROM Abd/Add Pulleys 3 x 10 Seated LUE forward flexion AROM 1 x 10 Supine Shoulder AAROM Flexion with #7.5 1 x 10  Ball toss with LUE against wall 1 x 10  Chair Push Up for lat and tricep activation 1 x 10  Lat Pull Down with black 1 x 10    05/09/22  UBE Seat and Arms 11 with resistance level 2- 5 (2.5 min forward and 2.5 backward) Shoulder AAROM Flex/Ext Pulleys 3 x 10  Shoulder AAROM Abd/Add Pulleys 3 x 10 Seated LUE forward flexion #1 DB 1 x 10  Supine Shoulder AAROM Flexion with #3 1 x 10  Supine Shoulder AAROM Flexion with #5 1 x 10  -NPS 7/10 in LUE  Supine Shoulder LUE Flexion with #4 1 x 10  Sky Punches on LUE #4 2 x 10  -min VC to maintain left elbow extension throughout exercise  Sky Punches on LUE #5 1 x 10  Bent over single row with #5 DB on  LUE 1 x 10  Bent over single row with black TB on LUE 1 x 10   05/05/22 UBE Seat and Arms 8 with resistance level 2- 5 ( 2.5 min forward and 2.5 min backward) Shoulder AAROM Flex/Ext Pulleys 3 x 10  Shoulder AAROM Abd/Add Pulleys 3 x 10 Upper Trap Stretch R & L 3 x 30 sec  Left Bicep Stretch 3 x 30 sec            Right Side Lying Left Shoulder Abduction #1 DB 1 x 10            Right Side Lying Left Shoulder Abduction #2 DB 2 x 10           Right Side Lying Left Shoulder ER at 0 deg abduction #2 3 x 10    05/02/22 UBE Seat and Arms 8 with resistance level 2- 5 ( 2.5 min forward and 2.5 min backward) Shoulder AAROM Flex/Ext Pulleys 3 x 10  Shoulder AAROM Abd/Add Pulleys 3 x 10            Left   Shoulder Flexion with AAROM with eccentric movement #1 DB 3 x 10  Left Shoulder Abduction with AAR0M with eccentric movement #1 DB 3 x 10  -Pt applied downward overpressure to prevent pt from elevating left shoulder  Supine Left Shoulder Abduction with #1 DB 2 x 10     PATIENT EDUCATION: Education details: form and technique for appropriate exercise and explanation of plan of care  Person educated: Patient Education method: Customer service manager and handout  Education comprehension: verbalized understanding, returned demonstration, and verbal cues required     HOME EXERCISE PROGRAM: Access Code: [email or web address] URL: [email or web address] Date: 05/12/2022 Prepared by: Bradly Chris  Exercises - Seated Shoulder Flexion AAROM with Pulley Behind  - 1 x daily - 7 x weekly - 3 sets - 10 reps - Seated Shoulder Abduction AAROM with Pulley Behind  - 1 x daily - 7 x weekly - 3 sets - 10 reps - Supine Shoulder Flexion Extension AAROM with Dowel  - 1 x daily - 7 x weekly - 3 sets - 10 reps - Shoulder Single Arm Flexion AAROM  - 1 x daily - 7 x weekly - 3 sets - 10 reps - Seated Upper Trapezius Stretch  - 1 x daily - 7 x weekly - 1 sets - 3 reps - 60 hold - Latissimus Dorsi Stretch at  Wall  - 1 x daily - 7 x weekly - 1 sets - 3 reps - 30 hold - Sidelying Shoulder External Rotation  - 1 x daily - 3 x weekly - 3 sets - 10 reps - Prone Shoulder Horizontal Abduction  - 1 x daily - 3 x weekly - 3 sets - 8 reps - Prone W Scapular Retraction  - 1 x daily - 3 x weekly - 3 sets - 8 reps - Standing Bicep Stretch at Wall  - 1 x daily - 7 x weekly - 3 reps - 30 hold - Sidelying Shoulder Abduction Full Range of Motion with Dumbbell  - 1 x daily - 3 x weekly - 3 sets - 10 reps - Supine Shoulder Flexion with Anchored Resistance  - 1 x daily - 3 x weekly - 3 sets - 10 reps - Supine Scapular Protraction in Flexion with Dumbbells  - 1 x daily - 3 x weekly - 3 sets - 10 reps - Bent Over Single Arm Shoulder Row with Dumbbell  - 1 x daily - 3 x weekly - 3 sets - 10 reps - Standing Lat Pull Down with Resistance - Arms Straight  - 3 x weekly - 3 sets - 10 reps  ASSESSMENT:   CLINICAL IMPRESSION:  Pt exhibits improved shoulder strength with progression in resisted shoulder flexion. PT introduced lat exercise because pt will be relaying more pushing up with UE with recovery from upcoming hip surgery. Pt to reschedule for next week for final visit before surgery, but if apt unavailable then he will be discharged to focus on recovery from hip surgery. He will continue to benefit from skilled PT to increase left shoulder ROM and strength to return to using left shoulder for overhead activities.   OBJECTIVE IMPAIRMENTS decreased ROM, decreased strength, hypomobility, impaired UE functional use, and pain.    ACTIVITY LIMITATIONS carrying, lifting, dressing, and reach over head   PARTICIPATION LIMITATIONS: cleaning, occupation, and yard work   PERSONAL FACTORS Fitness, Sex, and Time since onset of injury/illness/exacerbation are also affecting patient's functional outcome.    REHAB POTENTIAL: Good   CLINICAL DECISION MAKING: Stable/uncomplicated  EVALUATION COMPLEXITY: Low     GOALS: Goals  reviewed with patient? No   SHORT TERM GOALS: Target date: 03/21/2022     Pt will be independent with HEP in order to improve strength and balance in order to decrease fall risk and improve function at home and work. Baseline: Performing HEP independently  Goal status: Partially Met      LONG TERM GOALS: Target date: 05/16/2022     Patient will have improved function and activity level as evidenced by an increase in FOTO score by 10 points or more.  Baseline: 50/65  04/07/22: 42/65 04/25/22: 57/65 05/12/22 54/65 Goal status: Partially met    2.  Patient will improve left shoulder ROM to be close to symmetrical to right shoulder to return to performing overhead activities to complete job related tasks and yard work.  Baseline: Shoulder Flexion R/L 80*/60* , Shoulder Abduction R/L 80*/60*, Shoulder IR R/L 70/50*, Shoulder ER R/L 90/70* 04/25/22: Shoulder Flex R/L 130/60*, Shoulder Abd R/L 160/70*, Shoulder ER R/L 90/40* Shoulder IR R/L 70/70   Shoulder Flex PROM R 160*, Shoulder Abd R 150*, Shoulder ER R 50   Goal status: ONGOING    3.  Patient will improve shoulder MMT by >=1/2 muscle grade for improved function to return to completing job related tasks and yard work.  Baseline: Shoulder  Flex R/L 4/3-,Shoulder Abd R/L 4/3- , Shoulder IR R/L 4/4-, Shoulder ER R/L 4/4-, Lower Trap R/L 4-/3-, Mid Trap R/L 4/4 04/25/22: Shoulder Flex R/L 5/4+ Shoulder Abd R/L 5/4+ Shoulder ER R/L 5/4+, Shoulder IR R/L 5/4+  Mid Trap R/L 4/4  Lower Trap R/L 4-/3-    Goal status: ONGOING      PLAN: PT FREQUENCY: 1-2x/week   PT DURATION: 10 weeks   PLANNED INTERVENTIONS: Therapeutic exercises, Therapeutic activity, Balance training, Patient/Family education, Joint manipulation, Joint mobilization, Aquatic Therapy, Dry Needling, Electrical stimulation, Spinal manipulation, Spinal mobilization, Cryotherapy, Moist heat, Manual therapy, and Re-evaluation   PLAN FOR NEXT SESSION:  Finalize HEP. Reassess goals.Switch  parascapular and shoulder strengthening exercises with focus on decreased shoulder elevation.    Daniel Fleming PT, DPT  [facility] [address] [place], [place], [address] Phone: [phone]   Fax:  [phone]  Name: Joshua Cole MRN: [id number] Date of Birth: 10/02/1960 

## 2022-05-12 NOTE — Progress Notes (Signed)
Virtual Visit via Telephone Note   Because of Joshua Cole's co-morbid illnesses, he is at least at moderate risk for complications without adequate follow up.  This format is felt to be most appropriate for this patient at this time.  The patient did not have access to video technology/had technical difficulties with video requiring transitioning to audio format only (telephone).  All issues noted in this document were discussed and addressed.  No physical exam could be performed with this format.  Please refer to the patient's chart for his consent to telehealth for [facility].  Evaluation Performed:  Preoperative cardiovascular risk assessment _____________   Date:  05/12/2022   Patient ID:  Joshua Cole, DOB Dec 09, 1960, MRN [id number] Patient Location:  Home Provider location:   Office  Primary Care Provider:  Pcp, No Primary Cardiologist:  Kathlyn Sacramento, MD  Chief Complaint / Patient Profile   61 y.o. 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  Past Medical History    Past Medical History:  Diagnosis Date   Arthritis    Asthma    Chronic kidney disease    Diabetes mellitus without complication (HCC)    type 2   DJD (degenerative joint disease)    Dysrhythmia    junctional tachycardia and incomplete heart block   Gout    Lung nodules    a. 09/2021 CT chest: Interval decrease in size and number of bilateral lung nodules, likely consistent with sequelae associated with an infectious/inflammatory process.   MSSA bacteremia 06/2021   Rotator cuff tear 08/26/2021   Subacute bacterial endocarditis (SBE)    a. 06/2021 TEE: mobile mass attached to the tricuspid valve-->Abx rx-->09/2021 TEE: EF 55-60%, no rwma, nl RV fxn, mild-mod RAE, mild MR, mobile echodense 11x23m mass in the TV  apparatus-->conservative rx per TCTS.   Past Surgical History:  Procedure Laterality Date   ACHILLES TENDON REPAIR Left    many years ago per pt   BIOPSY  07/16/2021   Procedure: BIOPSY;  Surgeon: DSharyn Creamer MD;  [facility]  Service: Gastroenterology;;  EGD and COLON   COLONOSCOPY N/A 07/16/2021   Procedure: COLONOSCOPY;  Surgeon: DSharyn Creamer MD;  [place]  Service: Gastroenterology;  Laterality: N/A;   COLONOSCOPY WITH PROPOFOL N/A 07/16/2021   Procedure: COLONOSCOPY WITH PROPOFOL;  Surgeon: DSharyn Creamer MD;  [place]  Service: Gastroenterology;  Laterality: N/A;   ESOPHAGOGASTRODUODENOSCOPY (EGD) WITH PROPOFOL N/A 07/16/2021   Procedure: ESOPHAGOGASTRODUODENOSCOPY (EGD) WITH PROPOFOL;  Surgeon: DSharyn Creamer MD;  [place]  Service: Gastroenterology;  Laterality: N/A;   FOOT SURGERY Right    ligaments repaired- many years ago per pt   JOINT REPLACEMENT Bilateral    hip   POLYPECTOMY  07/16/2021   Procedure: POLYPECTOMY;  Surgeon: DSharyn Creamer MD;  [facility]  Service: Gastroenterology;;   SHOULDER ARTHROSCOPY WITH ROTATOR CUFF REPAIR AND SUBACROMIAL DECOMPRESSION Left 02/10/2022   Procedure: LEFT SHOULDER ARTHROSCOPY WITH EXTENSIVE DEBRIDEMENT, SUBACROMIAL DECOMPRESSION;  Surgeon: BMcarthur Rossetti MD;  [place]  Service: Orthopedics;  Laterality: Left;   TEE WITHOUT CARDIOVERSION N/A 07/07/2021   Procedure: TRANSESOPHAGEAL ECHOCARDIOGRAM (TEE);  Surgeon: AKate Sable MD;  Location: [facility];  Service: Cardiovascular;  Laterality: N/A;    Allergies  No Known Allergies  History of Present Illness    Joshua  Cole is a 61 y.o. male who presents via [employer], [employer] (consulting) for a telehealth visit today.  Pt was last seen in cardiology clinic on 03/02/2022 by Dr. Quentin Ore.  At that time Joshua Cole was doing well.  The patient is now pending procedure as outlined above. Since  his last visit, he has been doing well.  He was able to undergo left shoulder surgery on 02/10/2022 after reassuring nuclear stress test in March 2023.   Home Medications    Prior to Admission medications   Medication Sig Start Date End Date Taking? Authorizing Provider  acetaminophen (TYLENOL) 500 MG tablet Take 500 mg by mouth every 4 (four) hours as needed for mild pain.    [provider]  albuterol (VENTOLIN HFA) 108 (90 Base) MCG/ACT inhaler Inhale 1-2 puffs into the lungs every 6 (six) hours as needed for wheezing or shortness of breath. 04/05/22   Iloabachie, Chioma E, NP  diclofenac Sodium (VOLTAREN) 1 % GEL Apply 2 g topically 2 (two) times daily as needed (pain).    [provider]  furosemide (LASIX) 20 MG tablet Take 1 tablet (20 mg total) by mouth once daily as needed (swelling or weight gain). 07/22/21   Danford, Suann Larry, MD  gabapentin (NEURONTIN) 300 MG capsule Take 1 capsule (300 mg total) by mouth 2 (two) times daily. 03/31/22   Iloabachie, Chioma E, NP  glucose blood (RIGHTEST GS550 BLOOD GLUCOSE) test strip USE AS DIRECTED TO CHECK BLOOD SUGAR UP TO 4 TIMES DAILY. 08/25/21   Iloabachie, Chioma E, NP  Insulin Glargine (BASAGLAR KWIKPEN) 100 UNIT/ML Inject 15 Units into the skin once daily at bedtime. 10/05/21   Iloabachie, Chioma E, NP  Insulin Pen Needle 32G X 4 MM MISC USE AS DIRECTED 09/28/21   Cammy Copa, RPH  ketotifen (ZADITOR) 0.025 % ophthalmic solution Place 1 drop into both eyes daily as needed (allergies).    [provider]  loratadine (CLARITIN) 10 MG tablet Take 10 mg by mouth daily as needed for allergies.    [provider]  meloxicam (MOBIC) 15 MG tablet Take 1 tablet (15 mg total) by mouth daily. 03/31/22   Iloabachie, Chioma E, NP  metFORMIN (GLUCOPHAGE) 1000 MG tablet Take 0.5 tablets (500 mg total) by mouth 2 (two) times daily with a meal. 03/31/22   Iloabachie, Chioma E, NP  polyvinyl alcohol (LIQUIFILM TEARS) 1.4 %  ophthalmic solution Place 1 drop into both eyes as needed for dry eyes.    [provider]  Rightest GL300 Lancets MISC USE AS DIRECTED TO CHECK BLOOD SUGAR UP TO 4 TIMES DAILY. 08/25/21   Iloabachie, Chioma E, NP  traMADol (ULTRAM) 50 MG tablet Take 1 tablet (50 mg total) by mouth every 6 (six) hours as needed. 05/09/22   Pete Pelt, PA-C  sitaGLIPtin (JANUVIA) 100 MG tablet Take (1/2) tablet (50 mg total) by mouth once daily. 08/25/21 10/21/21  Langston Reusing, NP    Physical Exam    Vital Signs:  Joshua Cole does not have vital signs available for review today.  Given telephonic nature of communication, physical exam is limited. AAOx3. NAD. Normal affect.  Speech and respirations are unlabored.  Accessory Clinical Findings    None  Assessment & Plan    1.  Preoperative Cardiovascular Risk Assessment:  -Patient previously had a low risk nuclear stress test in March 2023, recently underwent left shoulder repair.  He is due for upcoming left total hip replacement by Dr. [name] on 05/20/2022.  He denies any recent chest pain or worsening dyspnea.  He is at acceptable risk to proceed from the cardiac perspective.   A copy of this note will be routed to requesting surgeon.  Time:   Today, I have spent 4 minutes with the patient with telehealth technology discussing medical history, symptoms, and management plan.     [place], [place]  05/12/2022, 1:37 PM

## 2022-05-13 ENCOUNTER — Other Ambulatory Visit: Payer: Self-pay | Admitting: Gerontology

## 2022-05-13 DIAGNOSIS — M25512 Pain in left shoulder: Secondary | ICD-10-CM

## 2022-05-17 ENCOUNTER — Other Ambulatory Visit: Payer: Self-pay

## 2022-05-17 ENCOUNTER — Ambulatory Visit: Payer: Medicaid Other | Attending: Cardiology | Admitting: Physical Therapy

## 2022-05-17 ENCOUNTER — Encounter: Payer: Self-pay | Admitting: Physical Therapy

## 2022-05-17 DIAGNOSIS — M25512 Pain in left shoulder: Secondary | ICD-10-CM | POA: Insufficient documentation

## 2022-05-17 MED ORDER — MELOXICAM 15 MG PO TABS
15.0000 mg | ORAL_TABLET | Freq: Every day | ORAL | 0 refills | Status: DC
  Filled 2022-05-17: qty 30, 30d supply, fill #0

## 2022-05-17 NOTE — Therapy (Addendum)
OUTPATIENT PHYSICAL THERAPY TREATMENT NOTE   Patient Name: Joshua Cole MRN: [id number] DOB:1960/10/11, 61 y.o., male Today's Date: 05/17/2022  PCP: No PCP   REFERRING PROVIDER: Dr. Quentin Ore   END OF SESSION:   PT End of Session - 05/17/22 1127     Visit Number 16    Number of Visits 20    Date for PT Re-Evaluation 07/17/22    Authorization Type [place] [employer]    Progress Note Due on Visit 20    PT Start Time 1105    PT Stop Time 1145    PT Time Calculation (min) 40 min    Activity Tolerance Patient tolerated treatment well    Behavior During Therapy WFL for tasks assessed/performed               Past Medical History:  Diagnosis Date   Arthritis    Asthma    Chronic kidney disease    Diabetes mellitus without complication ([place])    type 2   DJD (degenerative joint disease)    Dysrhythmia    junctional tachycardia and incomplete heart block   Gout    Lung nodules    a. 09/2021 CT chest: Interval decrease in size and number of bilateral lung nodules, likely consistent with sequelae associated with an infectious/inflammatory process.   MSSA bacteremia 06/2021   Rotator cuff tear 08/26/2021   Subacute bacterial endocarditis (SBE)    a. 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   Past Surgical History:  Procedure Laterality Date   ACHILLES TENDON REPAIR Left    many years ago per pt   BIOPSY  07/16/2021   Procedure: BIOPSY;  Surgeon: DSharyn Creamer MD;  [facility]  Service: Gastroenterology;;  EGD and COLON   COLONOSCOPY N/A 07/16/2021   Procedure: COLONOSCOPY;  Surgeon: DSharyn Creamer MD;  [place] [facility]  Service: Gastroenterology;  Laterality: N/A;   COLONOSCOPY WITH PROPOFOL N/A 07/16/2021   Procedure: COLONOSCOPY WITH PROPOFOL;  Surgeon: DSharyn Creamer MD;  [facility] [facility]  Service: Gastroenterology;  Laterality: N/A;   ESOPHAGOGASTRODUODENOSCOPY (EGD) WITH PROPOFOL N/A 07/16/2021   Procedure: ESOPHAGOGASTRODUODENOSCOPY (EGD) WITH PROPOFOL;  Surgeon: DSharyn Creamer MD;  [place] [facility]  Service: Gastroenterology;  Laterality: N/A;   FOOT SURGERY Right    ligaments repaired- many years ago per pt   JOINT REPLACEMENT Bilateral    hip   POLYPECTOMY  07/16/2021   Procedure: POLYPECTOMY;  Surgeon: DSharyn Creamer MD;  [facility];  Service: Gastroenterology;;   SHOULDER ARTHROSCOPY WITH ROTATOR CUFF REPAIR AND SUBACROMIAL DECOMPRESSION Left 02/10/2022   Procedure: LEFT SHOULDER ARTHROSCOPY WITH EXTENSIVE DEBRIDEMENT, SUBACROMIAL DECOMPRESSION;  Surgeon: BMcarthur Rossetti MD;  [place] [facility]  Service: Orthopedics;  Laterality: Left;   TEE WITHOUT CARDIOVERSION N/A 07/07/2021   Procedure: TRANSESOPHAGEAL ECHOCARDIOGRAM (TEE);  Surgeon: AKate Sable MD;  Location: [facility];  Service: Cardiovascular;  Laterality: N/A;   Patient Active Problem List   Diagnosis Date Noted   Acid reflux 03/31/2022   Left hip pain 03/31/2022   Complete tear of left rotator cuff 02/10/2022   Headache 11/02/2021   Peripheral neuropathy 10/05/2021   Acute gout 08/26/2021   Rotator cuff tear 08/26/2021   Encounter to establish care 08/25/2021   Type 2 diabetes mellitus without complications ([facility] 110/17/5102  History of  gout 08/25/2021   Left shoulder pain 08/25/2021   Duodenal ulcer    History of colonic polyps    Acute blood loss anemia    Heart block AV complete ([facility])    Abnormal LFTs    MSSA bacteremia 07/08/2021   Acute bacterial endocarditis    Heart block    Bacteremia    Hyponatremia 09/32/6712   Acute metabolic encephalopathy 45/80/9983   Joint pain 07/03/2021   Generalized weakness 07/03/2021   SIRS (systemic inflammatory response syndrome) ([place]) 07/03/2021   Hypokalemia 07/03/2021   Hyperglycemia  07/03/2021   Thrombocytopenia ([place]) 07/03/2021   Nausea vomiting and diarrhea 07/03/2021   Gout     REFERRING DIAG: Z98.890 (ICD-10-CM) - Status post arthroscopy of left shoulder   THERAPY DIAG:  Acute pain of left shoulder  Rationale for Evaluation and Treatment Rehabilitation  PERTINENT HISTORY: Per note from Dr. [name] on 02/17/22   The patient is following up at 1 week status post a left shoulder arthroscopy with subacromial decompression and extensive debridement.  We found severe synovitis in the shoulder but no rotator cuff tear.  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   I did share with him the arthroscopy pictures showing him the amount of synovitis in his shoulder.  It is essential we get him set up for outpatient physical therapy to work on any modalities that can get his shoulder moving better and decrease his pain.  Incisions look good.  The sutures have been removed.   We will see him back in 4 weeks to see how he is doing overall.  Apparently has a remote history of hip replacements and he needs to have these evaluated.  At his next visit awe will have a AP pelvis and lateral both hips.  PRECAUTIONS: None   SUBJECTIVE: Pt reports his left shoulder has been feeling much better since last visit. He was recently prescribed tramadol for his left hip which has helped significantly with the pain. He will be getting a left hip surgery at the end of next week to remove hip replacement.    PAIN:  Are you having pain? Yes: NPRS scale: 1-2/10 Pain location: Left anterior shoulder Pain description: Achy Aggravating factors: moving shoulder overhead  Relieving factors: nothing really helps          VITALS: BP 156/96  HR 97 SpO2 100    DIAGNOSTIC FINDINGS:  CLINICAL DATA:  Left shoulder pain   EXAM: MRI  OF THE LEFT SHOULDER WITHOUT AND WITH CONTRAST   TECHNIQUE: Multiplanar, multisequence MR imaging of the left shoulder was performed before and after the administration of intravenous contrast.   CONTRAST:  43m GADAVIST GADOBUTROL 1 MMOL/ML IV SOLN   COMPARISON:  Left shoulder radiograph 07/12/2021   FINDINGS: Rotator cuff: There is a full-thickness, near full width tear of the supraspinatus tendon at the footprint (coronal T2 image 12). There is high-grade articular sided tearing of the anterior fibers of the infraspinatus tendon at the footprint (sagittal T2 image 4-6). Mild tendinosis of the cephalad fibers of the subscapularis tendon. The teres minor is intact.   Muscles: Intramuscular edema within the infraspinatus and deltoid muscles. No significant muscle atrophy.   Biceps Long Head: Intact intra and extra-articular long head biceps tendon.   Acromioclavicular Joint: Moderate-severe arthropathy of the acromioclavicular joint. There is  distension of the subacromial-subdeltoid bursa with thick peripheral enhancement, short axis measuring up to 0.9 cm. (Coronal T1 post-contrast image 12, sagittal post image 5).   Glenohumeral Joint: Trace glenohumeral joint fluid. Mild chondrosis.   Labrum: Degenerative superior labral tearing extending anteriorly and posteriorly through the biceps labral anchor.   Bones: No fracture or dislocation. No aggressive osseous lesion. No convincing marrow signal changes to suggest osteomyelitis. There is low T1 signal within the scapula, acromion, clavicle, nonspecific.   Other: No fluid collection or hematoma.   IMPRESSION: Full-thickness, near full width tear of the supraspinatus tendon at the footprint. High-grade articular sided tearing of the anterior fibers of the infraspinatus tendon at the footprint. Mild subscapularis tendinosis of the cephalad fibers. No significant muscle atrophy.   Distension of the subacromial-subdeltoid  bursa with a peripheral enhancement, short axis measuring up to 0.9 cm. While this could be related to the patient's rotator cuff tear, given adjacent reactive edema in the deltoid and clinical concern for infection, bursa aspiration should be considered.   Minimal there is minimal glenohumeral joint fluid and without adjacent marrow signal abnormality to suggest osteomyelitis. Low T1 signal within the scapula, acromion and clavicle is nonspecific, possibly related to the patient's anemia.   Degenerative superior labral tearing anteriorly and posteriorly through the biceps labral anchor.   Moderate-severe AC joint arthropathy.     Electronically Signed   By: Maurine Simmering M.D.   On: 07/14/2021 16:59   PATIENT SURVEYS:  FOTO 50/65   COGNITION:           Overall cognitive status: Within functional limits for tasks assessed                                  SENSATION: WFL   POSTURE: Slight round in shoulders    UPPER EXTREMITY ROM:         Active ROM Right eval Left eval  Shoulder flexion 80 60*  Shoulder extension 60 60  Shoulder abduction 80 60*  Shoulder adduction      Shoulder internal rotation 70 50*  Shoulder external rotation 90 70*  Elbow flexion 150 150  Elbow extension 60 60  Wrist flexion 80 80  Wrist extension 70 70  Wrist ulnar deviation 30 30  Wrist radial deviation 20 20  Wrist pronation      Wrist supination      (Blank rows = not tested)      Passive ROM Right eval Left eval  Shoulder flexion 160 180  Shoulder extension 60 60  Shoulder abduction 180  180  Shoulder adduction      Shoulder internal rotation 70 70  Shoulder external rotation 90 90  Elbow flexion 150 150  Elbow extension 60 60  Wrist flexion 80 80  Wrist extension 70 70  Wrist ulnar deviation 30 30  Wrist radial deviation 20 20  Wrist pronation      Wrist supination      (Blank rows = not tested)              UPPER EXTREMITY MMT:   MMT Right eval Left eval   Shoulder flexion 4 3-  Shoulder extension 5 5  Shoulder abduction 4 3-  Shoulder adduction      Shoulder internal rotation 4 4-  Shoulder external rotation 4 4-  Middle trapezius 4 4  Lower trapezius 4 3-  Elbow flexion 5 5  Elbow  extension 5 5  Wrist flexion 5 5  Wrist extension 5 5  Wrist ulnar deviation      Wrist radial deviation      Wrist pronation      Wrist supination      Grip strength (lbs)      (Blank rows = not tested)   SHOULDER SPECIAL TESTS:            Impingement tests:  Painful Arch + L               SLAP lesions:  NT            Instability tests:  NT            Rotator cuff assessment:  Infraspinatus + L            Biceps assessment: Speed's Test: + L        JOINT MOBILITY TESTING:  Shoulder Right Inferior Glides Grade III-firm end feel  Shoulder Right AP Glides Grade III-IV - firm end feel    PALPATION:  TTP in anterior of left shoulder              TODAY'S TREATMENT:   05/17/22 UBE Seat and Arms 11 with resistance level 2- 5 (2.5 min forward and 2.5 backward) Shoulder AAROM Flex/Ext Pulleys 3 x 10  Shoulder AAROM Abd/Add Pulleys 3 x 10 Supine Shoulder Flexion with LUE #2 1 x 10  Supine Shoulder Flexion with LUE #2 DB 1 x 10  Seated Shoulder AAROM Flexion with #3 AW on dowel 1 x 10   Seated Shoulder Abduction with #1 DB 1 x 10  Seated Shoulder Abduction ([place]ion) with #1 DB 3 x 10    05/12/22 UBE Seat and Arms 11 with resistance level 2- 5 (2.5 min forward and 2.5 backward) Shoulder AAROM Flex/Ext Pulleys 3 x 10  Shoulder AAROM Abd/Add Pulleys 3 x 10 Seated LUE forward flexion AROM 1 x 10 Supine Shoulder AAROM Flexion with #7.5 1 x 10  Ball toss with LUE against wall 1 x 10  Chair Push Up for lat and tricep activation 1 x 10  Lat Pull Down with black 1 x 10    05/09/22  UBE Seat and Arms 11 with resistance level 2- 5 (2.5 min forward and 2.5 backward) Shoulder AAROM Flex/Ext Pulleys 3 x 10  Shoulder AAROM Abd/Add Pulleys 3 x 10 Seated  LUE forward flexion #1 DB 1 x 10  Supine Shoulder AAROM Flexion with #3 1 x 10  Supine Shoulder AAROM Flexion with #5 1 x 10  -NPS 7/10 in LUE  Supine Shoulder LUE Flexion with #4 1 x 10  Sky Punches on LUE #4 2 x 10  -min VC to maintain left elbow extension throughout exercise  Sky Punches on LUE #5 1 x 10  Bent over single row with #5 DB on LUE 1 x 10  Bent over single row with black TB on LUE 1 x 10   05/05/22 UBE Seat and Arms 8 with resistance level 2- 5 ( 2.5 min forward and 2.5 min backward) Shoulder AAROM Flex/Ext Pulleys 3 x 10  Shoulder AAROM Abd/Add Pulleys 3 x 10 Upper Trap Stretch R & L 3 x 30 sec  Left Bicep Stretch 3 x 30 sec            Right Side Lying Left Shoulder Abduction #1 DB 1 x 10            Right Side  Lying Left Shoulder Abduction #2 DB 2 x 10           Right Side Lying Left Shoulder ER at 0 deg abduction #2 3 x 10    05/02/22 UBE Seat and Arms 8 with resistance level 2- 5 ( 2.5 min forward and 2.5 min backward) Shoulder AAROM Flex/Ext Pulleys 3 x 10  Shoulder AAROM Abd/Add Pulleys 3 x 10            Left Shoulder Flexion with AAROM with eccentric movement #1 DB 3 x 10  Left Shoulder Abduction with AAR0M with eccentric movement #1 DB 3 x 10  -Pt applied downward overpressure to prevent pt from elevating left shoulder  Supine Left Shoulder Abduction with #1 DB 2 x 10     PATIENT EDUCATION: Education details: form and technique for appropriate exercise and explanation of plan of care  Person educated: Patient Education method: Customer service manager and handout  Education comprehension: verbalized understanding, returned demonstration, and verbal cues required     HOME EXERCISE PROGRAM: Access Code: [email or web address] URL: [email or web address] Date: 05/17/2022 Prepared by: Bradly Chris  Exercises - Seated Shoulder Flexion AAROM with Pulley Behind  - 1 x daily - 7 x weekly - 3 sets - 10 reps - Seated Shoulder Abduction AAROM with Pulley  Behind  - 1 x daily - 7 x weekly - 3 sets - 10 reps - Seated Upper Trapezius Stretch  - 1 x daily - 7 x weekly - 1 sets - 3 reps - 60 hold - Latissimus Dorsi Stretch at Wall  - 1 x daily - 7 x weekly - 1 sets - 3 reps - 30 hold - Sidelying Shoulder External Rotation  - 1 x daily - 3 x weekly - 3 sets - 10 reps - Prone Shoulder Horizontal Abduction  - 1 x daily - 3 x weekly - 3 sets - 8 reps - Prone W Scapular Retraction  - 1 x daily - 3 x weekly - 3 sets - 8 reps - Standing Bicep Stretch at Wall  - 1 x daily - 7 x weekly - 3 reps - 30 hold - Sidelying Shoulder Abduction Full Range of Motion with Dumbbell  - 1 x daily - 3 x weekly - 3 sets - 10 reps - Supine Scapular Protraction in Flexion with Dumbbells  - 1 x daily - 3 x weekly - 3 sets - 10 reps - Bent Over Single Arm Shoulder Row with Dumbbell  - 1 x daily - 3 x weekly - 3 sets - 10 reps - Standing Lat Pull Down with Resistance - Arms Straight  - 3 x weekly - 3 sets - 10 reps - Seated Shoulder Flexion AAROM with Dowel  - 3 x weekly - 3 sets - 10 reps - Seated Shoulder Scaption with Dumbbells  - 3 x weekly - 3 sets - 10 reps  ASSESSMENT:   CLINICAL IMPRESSION: Pt shows improvement in LUE strength with ability to perform shoulder strengthening exercises against gravity and without an increase in his pain. He still limited in performing abduction and flexion past 90 degrees. Next visit will be last visit given impending hip surgery. He will continue to benefit from skilled PT to increase left shoulder ROM and strength to return to using left shoulder for overhead activities.   OBJECTIVE IMPAIRMENTS decreased ROM, decreased strength, hypomobility, impaired UE functional use, and pain.    ACTIVITY LIMITATIONS carrying, lifting, dressing, and reach over head   PARTICIPATION LIMITATIONS:  cleaning, occupation, and yard work   PERSONAL FACTORS Fitness, Sex, and Time since onset of injury/illness/exacerbation are also affecting patient's  functional outcome.    REHAB POTENTIAL: Good   CLINICAL DECISION MAKING: Stable/uncomplicated   EVALUATION COMPLEXITY: Low     GOALS: Goals reviewed with patient? No   SHORT TERM GOALS: Target date: 03/21/2022     Pt will be independent with HEP in order to improve strength and balance in order to decrease fall risk and improve function at home and work. Baseline: Performing HEP independently  Goal status: Partially Met      LONG TERM GOALS: Target date: 05/16/2022     Patient will have improved function and activity level as evidenced by an increase in FOTO score by 10 points or more.  Baseline: 50/65  04/07/22: 42/65 04/25/22: 57/65 05/12/22 54/65 Goal status: Partially met    2.  Patient will improve left shoulder ROM to be close to symmetrical to right shoulder to return to performing overhead activities to complete job related tasks and yard work.  Baseline: Shoulder Flexion R/L 80*/60* , Shoulder Abduction R/L 80*/60*, Shoulder IR R/L 70/50*, Shoulder ER R/L 90/70* 04/25/22: Shoulder Flex R/L 130/60*, Shoulder Abd R/L 160/70*, Shoulder ER R/L 90/40* Shoulder IR R/L 70/70   Shoulder Flex PROM R 160*, Shoulder Abd R 150*, Shoulder ER R 50   Goal status: ONGOING    3.  Patient will improve shoulder MMT by >=1/2 muscle grade for improved function to return to completing job related tasks and yard work.  Baseline: Shoulder  Flex R/L 4/3-,Shoulder Abd R/L 4/3- , Shoulder IR R/L 4/4-, Shoulder ER R/L 4/4-, Lower Trap R/L 4-/3-, Mid Trap R/L 4/4 04/25/22: Shoulder Flex R/L 5/4+ Shoulder Abd R/L 5/4+ Shoulder ER R/L 5/4+, Shoulder IR R/L 5/4+  Mid Trap R/L 4/4  Lower Trap R/L 4-/3-    Goal status: ONGOING      PLAN: PT FREQUENCY: 1-2x/week   PT DURATION: 10 weeks   PLANNED INTERVENTIONS: Therapeutic exercises, Therapeutic activity, Balance training, Patient/Family education, Joint manipulation, Joint mobilization, Aquatic Therapy, Dry Needling, Electrical stimulation, Spinal manipulation,  Spinal mobilization, Cryotherapy, Moist heat, Manual therapy, and Re-evaluation   PLAN FOR NEXT SESSION:  Finalize HEP. Reassess goals.Switch parascapular and shoulder strengthening exercises with focus on decreased shoulder elevation.    Bradly Chris PT, DPT  [facility] [address], [place], [address] Phone: [phone]   Fax:  [phone]  Name: Joshua Cole MRN: [id number] Date of Birth: May 08, 1961

## 2022-05-17 NOTE — Addendum Note (Signed)
Addended by: Daneil Dan on: 05/17/2022 12:15 PM   Modules accepted: Orders

## 2022-05-17 NOTE — Progress Notes (Signed)
Pt. Needs orders for surgery. 

## 2022-05-17 NOTE — Patient Instructions (Addendum)
DUE TO COVID-19 ONLY TWO VISITORS  (aged 60 and older)  ARE ALLOWED TO COME WITH YOU AND STAY IN THE WAITING ROOM ONLY DURING PRE OP AND PROCEDURE.   **NO VISITORS ARE ALLOWED IN THE SHORT STAY AREA OR RECOVERY ROOM!!**  IF YOU WILL BE ADMITTED INTO THE HOSPITAL YOU ARE ALLOWED ONLY FOUR SUPPORT PEOPLE DURING VISITATION HOURS ONLY (7 AM -8PM)   The support person(s) must pass our screening, gel in and out, and wear a mask at all times, including in the patient's room. Patients must also wear a mask when staff or their support person are in the room. Visitors GUEST BADGE MUST BE WORN VISIBLY  One adult visitor may remain with you overnight and MUST be in the room by 8 P.M.     Your procedure is scheduled on: 05/20/22   Report to [facility], The Main Entrance    Report to admitting at: 9:45 AM   Call this number if you have problems the morning of surgery [phone]   Do not eat food :After Midnight.   After Midnight you may have the following liquids until : 9:00 AM DAY OF SURGERY  Water Black Coffee (sugar ok, NO MILK/CREAM OR CREAMERS)  Tea (sugar ok, NO MILK/CREAM OR CREAMERS) regular and decaf                             Plain Jell-O (NO RED)                                           Fruit ices (not with fruit pulp, NO RED)                                     Popsicles (NO RED)                                                                  Juice: apple, WHITE grape, WHITE cranberry Sports drinks like Gatorade (NO RED)              Drink G2 drink AT 9:00 AM the day of surgery.      The day of surgery:  Drink ONE (1) Pre-Surgery Clear Ensure or G2 at AM the morning of surgery. Drink in one sitting. Do not sip.  This drink was given to you during your hospital  pre-op appointment visit. Nothing else to drink after completing the  Pre-Surgery Clear Ensure or G2.          If you have questions, please contact your surgeon's office.    Oral Hygiene is also important to  reduce your risk of infection.                                    Remember - BRUSH YOUR TEETH THE MORNING OF SURGERY WITH YOUR REGULAR TOOTHPASTE   Do NOT smoke after Midnight   Take these medicines the morning of surgery with A SIP  OF WATER: gabapentin,loratadine.use inhalers as usual.Tylenol as needed.  How to Manage Your Diabetes Before and After Surgery  Why is it important to control my blood sugar before and after surgery? Improving blood sugar levels before and after surgery helps healing and can limit problems. A way of improving blood sugar control is eating a healthy diet by:  Eating less sugar and carbohydrates  Increasing activity/exercise  Talking with your doctor about reaching your blood sugar goals High blood sugars (greater than 180 mg/dL) can raise your risk of infections and slow your recovery, so you will need to focus on controlling your diabetes during the weeks before surgery. Make sure that the doctor who takes care of your diabetes knows about your planned surgery including the date and location.  How do I manage my blood sugar before surgery? Check your blood sugar at least 4 times a day, starting 2 days before surgery, to make sure that the level is not too high or low. Check your blood sugar the morning of your surgery when you wake up and every 2 hours until you get to the Short Stay unit. If your blood sugar is less than 70 mg/dL, you will need to treat for low blood sugar: Do not take insulin. Treat a low blood sugar (less than 70 mg/dL) with  cup of clear juice (cranberry or apple), 4 glucose tablets, OR glucose gel. Recheck blood sugar in 15 minutes after treatment (to make sure it is greater than 70 mg/dL). If your blood sugar is not greater than 70 mg/dL on recheck, call [phone] for further instructions. Report your blood sugar to the short stay nurse when you get to Short Stay.  If you are admitted to the hospital after surgery: Your blood sugar  will be checked by the staff and you will probably be given insulin after surgery (instead of oral diabetes medicines) to make sure you have good blood sugar levels. The goal for blood sugar control after surgery is 80-180 mg/dL.   WHAT DO I DO ABOUT MY DIABETES MEDICATION?  Do not take oral diabetes medicines (pills) the morning of surgery.  THE DAY BEFORE SURGERY, take metformin as usual.Take ONLY half of the basaglar insulin at night.      THE MORNING OF SURGERY,DO NOT TAKE ANY ORAL DIABETIC MEDICATIONS DAY OF YOUR SURGERY  Bring CPAP mask and tubing day of surgery.                              You may not have any metal on your body including hair pins, jewelry, and body piercing             Do not wear lotions, powders, perfumes/cologne, or deodorant              Men may shave face and neck.   Do not bring valuables to the hospital. [place].   Contacts, dentures or bridgework may not be worn into surgery.   Bring small overnight bag day of surgery.   DO NOT Regino Ramirez. PHARMACY WILL DISPENSE MEDICATIONS LISTED ON YOUR MEDICATION LIST TO YOU DURING YOUR ADMISSION [place]!    Patients discharged on the day of surgery will not be allowed to drive home.  Someone NEEDS to stay with you for the first  24 hours after anesthesia.   Special Instructions: Bring a copy of your healthcare power of attorney and living will documents         the day of surgery if you haven't scanned them before.              Please read over the following fact sheets you were given: IF YOU HAVE QUESTIONS ABOUT YOUR PRE-OP INSTRUCTIONS PLEASE CALL [phone]     [facility] - Preparing for Surgery Before surgery, you can play an important role.  Because skin is not sterile, your skin needs to be as free of germs as possible.  You can reduce the number of germs on your skin by washing with CHG (chlorahexidine  gluconate) soap before surgery.  CHG is an antiseptic cleaner which kills germs and bonds with the skin to continue killing germs even after washing. Please DO NOT use if you have an allergy to CHG or antibacterial soaps.  If your skin becomes reddened/irritated stop using the CHG and inform your nurse when you arrive at Short Stay. Do not shave (including legs and underarms) for at least 48 hours prior to the first CHG shower.  You may shave your face/neck. Please follow these instructions carefully:  1.  Shower with CHG Soap the night before surgery and the  morning of Surgery.  2.  If you choose to wash your hair, wash your hair first as usual with your  normal  shampoo.  3.  After you shampoo, rinse your hair and body thoroughly to remove the  shampoo.                           4.  Use CHG as you would any other liquid soap.  You can apply chg directly  to the skin and wash                       Gently with a scrungie or clean washcloth.  5.  Apply the CHG Soap to your body ONLY FROM THE NECK DOWN.   Do not use on face/ open                           Wound or open sores. Avoid contact with eyes, ears mouth and genitals (private parts).                       Wash face,  Genitals (private parts) with your normal soap.             6.  Wash thoroughly, paying special attention to the area where your surgery  will be performed.  7.  Thoroughly rinse your body with warm water from the neck down.  8.  DO NOT shower/wash with your normal soap after using and rinsing off  the CHG Soap.                9.  Pat yourself dry with a clean towel.            10.  Wear clean pajamas.            11.  Place clean sheets on your bed the night of your first shower and do not  sleep with pets. Day of Surgery : Do not apply any lotions/deodorants the morning of surgery.  Please wear clean clothes to the hospital/surgery center.  FAILURE  TO FOLLOW THESE INSTRUCTIONS MAY RESULT IN THE CANCELLATION OF YOUR  SURGERY PATIENT SIGNATURE_________________________________  NURSE SIGNATURE__________________________________  ________________________________________________________________________   Adam Phenix  An incentive spirometer is a tool that can help keep your lungs clear and active. This tool measures how well you are filling your lungs with each breath. Taking long deep breaths may help reverse or decrease the chance of developing breathing (pulmonary) problems (especially infection) following: A long period of time when you are unable to move or be active. BEFORE THE PROCEDURE  If the spirometer includes an indicator to show your best effort, your nurse or respiratory therapist will set it to a desired goal. If possible, sit up straight or lean slightly forward. Try not to slouch. Hold the incentive spirometer in an upright position. INSTRUCTIONS FOR USE  Sit on the edge of your bed if possible, or sit up as far as you can in bed or on a chair. Hold the incentive spirometer in an upright position. Breathe out normally. Place the mouthpiece in your mouth and seal your lips tightly around it. Breathe in slowly and as deeply as possible, raising the piston or the ball toward the top of the column. Hold your breath for 3-5 seconds or for as long as possible. Allow the piston or ball to fall to the bottom of the column. Remove the mouthpiece from your mouth and breathe out normally. Rest for a few seconds and repeat Steps 1 through 7 at least 10 times every 1-2 hours when you are awake. Take your time and take a few normal breaths between deep breaths. The spirometer may include an indicator to show your best effort. Use the indicator as a goal to work toward during each repetition. After each set of 10 deep breaths, practice coughing to be sure your lungs are clear. If you have an incision (the cut made at the time of surgery), support your incision when coughing by placing a pillow or  rolled up towels firmly against it. Once you are able to get out of bed, walk around indoors and cough well. You may stop using the incentive spirometer when instructed by your caregiver.  RISKS AND COMPLICATIONS Take your time so you do not get dizzy or light-headed. If you are in pain, you may need to take or ask for pain medication before doing incentive spirometry. It is harder to take a deep breath if you are having pain. AFTER USE Rest and breathe slowly and easily. It can be helpful to keep track of a log of your progress. Your caregiver can provide you with a simple table to help with this. If you are using the spirometer at home, follow these instructions: [place] IF:  You are having difficultly using the spirometer. You have trouble using the spirometer as often as instructed. Your pain medication is not giving enough relief while using the spirometer. You develop fever of 100.5 F (38.1 C) or higher. SEEK IMMEDIATE MEDICAL CARE IF:  You cough up bloody sputum that had not been present before. You develop fever of 102 F (38.9 C) or greater. You develop worsening pain at or near the incision site. MAKE SURE YOU:  Understand these instructions. Will watch your condition. Will get help right away if you are not doing well or get worse. Document Released: 01/09/2007 Document Revised: 11/21/2011 Document Reviewed: 03/12/2007 [facility] Patient Information 2014 [place], [place].   ________________________________________________________________________

## 2022-05-18 ENCOUNTER — Other Ambulatory Visit: Payer: Self-pay | Admitting: Physician Assistant

## 2022-05-18 ENCOUNTER — Other Ambulatory Visit: Payer: Self-pay

## 2022-05-18 ENCOUNTER — Encounter (HOSPITAL_COMMUNITY): Payer: Self-pay

## 2022-05-18 ENCOUNTER — Encounter (HOSPITAL_COMMUNITY)
Admission: RE | Admit: 2022-05-18 | Discharge: 2022-05-18 | Disposition: A | Discharge status: Home / Self Care | Payer: Medicaid Other | Source: Ambulatory Visit | Attending: Orthopaedic Surgery | Admitting: Orthopaedic Surgery

## 2022-05-18 VITALS — BP 134/82 | HR 108 | Temp 98.4°F | Ht 70.0 in | Wt 227.0 lb

## 2022-05-18 DIAGNOSIS — I459 Conduction disorder, unspecified: Secondary | ICD-10-CM | POA: Diagnosis not present

## 2022-05-18 DIAGNOSIS — I442 Atrioventricular block, complete: Secondary | ICD-10-CM | POA: Diagnosis not present

## 2022-05-18 DIAGNOSIS — Z794 Long term (current) use of insulin: Secondary | ICD-10-CM | POA: Diagnosis not present

## 2022-05-18 DIAGNOSIS — Z01812 Encounter for preprocedural laboratory examination: Secondary | ICD-10-CM | POA: Insufficient documentation

## 2022-05-18 DIAGNOSIS — T8459XS Infection and inflammatory reaction due to other internal joint prosthesis, sequela: Secondary | ICD-10-CM

## 2022-05-18 DIAGNOSIS — N189 Chronic kidney disease, unspecified: Secondary | ICD-10-CM | POA: Insufficient documentation

## 2022-05-18 DIAGNOSIS — Z96649 Presence of unspecified artificial hip joint: Secondary | ICD-10-CM | POA: Diagnosis not present

## 2022-05-18 DIAGNOSIS — Z01818 Encounter for other preprocedural examination: Secondary | ICD-10-CM

## 2022-05-18 DIAGNOSIS — I339 Acute and subacute endocarditis, unspecified: Secondary | ICD-10-CM | POA: Diagnosis not present

## 2022-05-18 DIAGNOSIS — E1122 Type 2 diabetes mellitus with diabetic chronic kidney disease: Secondary | ICD-10-CM | POA: Insufficient documentation

## 2022-05-18 DIAGNOSIS — E119 Type 2 diabetes mellitus without complications: Secondary | ICD-10-CM | POA: Insufficient documentation

## 2022-05-18 HISTORY — DX: Conduction disorder, unspecified: I45.9

## 2022-05-18 LAB — GLUCOSE, CAPILLARY: Glucose-Capillary: 178 mg/dL — ABNORMAL HIGH (ref 70–99)

## 2022-05-18 LAB — CBC
HCT: 39.6 % (ref 39.0–52.0)
Hemoglobin: 12.6 g/dL — ABNORMAL LOW (ref 13.0–17.0)
MCH: 31.5 pg (ref 26.0–34.0)
MCHC: 31.8 g/dL (ref 30.0–36.0)
MCV: 99 fL (ref 80.0–100.0)
Platelets: 148 10*3/uL — ABNORMAL LOW (ref 150–400)
RBC: 4 MIL/uL — ABNORMAL LOW (ref 4.22–5.81)
RDW: 13.8 % (ref 11.5–15.5)
WBC: 8.6 10*3/uL (ref 4.0–10.5)
nRBC: 0 % (ref 0.0–0.2)

## 2022-05-18 LAB — BASIC METABOLIC PANEL
Anion gap: 8 (ref 5–15)
BUN: 10 mg/dL (ref 6–20)
CO2: 24 mmol/L (ref 22–32)
Calcium: 9.4 mg/dL (ref 8.9–10.3)
Chloride: 103 mmol/L (ref 98–111)
Creatinine, Ser: 0.71 mg/dL (ref 0.61–1.24)
GFR, Estimated: >60 mL/min (ref >60)
Glucose, Bld: 176 mg/dL — ABNORMAL HIGH (ref 70–99)
Potassium: 4.3 mmol/L (ref 3.5–5.1)
Sodium: 135 mmol/L (ref 135–145)

## 2022-05-18 LAB — SURGICAL PCR SCREEN
MRSA, PCR: NEGATIVE
Staphylococcus aureus: POSITIVE — AB

## 2022-05-18 NOTE — Progress Notes (Signed)
For Short Stay: [place] appointment date: Date of COVID positive in last 54 days:  Bowel Prep reminder:   For Anesthesia: PCP - Lynnell Grain: NP Cardiologist - Dr. Aris Everts. Clearance: Almyra Deforest: PA: 05/12/22: EPIC Chest x-ray - CT Chest: 10/09/21 EKG - 03/02/22 Stress Test -  ECHO - 10/07/21 Cardiac Cath -  Pacemaker/ICD device last checked: Pacemaker orders received: Device Rep notified:  Spinal Cord Stimulator:  Sleep Study -  CPAP -   Fasting Blood Sugar - N/A Checks Blood Sugar ___0__ times a day Date and result of last Hgb A1c-7.4: 03/31/22  Blood Thinner Instructions: Aspirin Instructions: Last Dose:  Activity level: Can go up a flight of stairs and activities of daily living without stopping and without chest pain and/or shortness of breath   Able to exercise without chest pain and/or shortness of breath   Unable to go up a flight of stairs without chest pain and/or shortness of breath     Anesthesia review: Hx: Dysrhythmias,DIA,Heart block,endocarditis.  Patient denies shortness of breath, fever, cough and chest pain at PAT appointment   Patient verbalized understanding of instructions that were given to them at the PAT appointment. Patient was also instructed that they will need to review over the PAT instructions again at home before surgery.

## 2022-05-18 NOTE — Progress Notes (Signed)
After confirmed with Dr. Trevor Mace PA about the need for PICC line, order was place in and IV team nurse has been notified.

## 2022-05-19 ENCOUNTER — Ambulatory Visit: Payer: Medicaid Other

## 2022-05-19 DIAGNOSIS — T8452XA Infection and inflammatory reaction due to internal left hip prosthesis, initial encounter: Principal | ICD-10-CM

## 2022-05-19 DIAGNOSIS — M25512 Pain in left shoulder: Secondary | ICD-10-CM | POA: Diagnosis not present

## 2022-05-19 NOTE — Progress Notes (Signed)
Anesthesia Chart Review   Case: [id number] Date/Time: 05/20/22 1200   Procedure: EXCISIONAL LEFT TOTAL HIP ARTHROPLASTY WITH ANTIBIOTIC SPACERS (Left: Hip)   Anesthesia type: Choice   Pre-op diagnosis: left total hip infection   Location: [facility] ROOM 09 / [facility]   Surgeons: Mcarthur Rossetti, MD       DISCUSSION:61 y.o. former smoker with h/o DM II, CKD, left hip infection scheduled for above procedure 05/20/2022 with Dr. Jean Rosenthal.   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 Last seen by cardiology 05/12/2022. Per OV note, "   -Patient previously had a low risk nuclear stress test in March 2023, recently underwent left shoulder repair.  He is due for upcoming left total hip replacement by Dr. [name] on 05/20/2022.  He denies any recent chest pain or worsening dyspnea.  He is at acceptable risk to proceed from the cardiac perspective."   Anticipate pt can proceed with planned procedure barring acute status change.   VS: BP 134/82   Pulse (!) 108   Temp 36.9 C (Oral)   Ht '5\' 10"'$  (1.778 m)   Wt 103 kg   SpO2 97%   BMI 32.57 kg/m   PROVIDERS: Pcp, No  Cardiologist - Dr. Aris Everts. LABS: Labs reviewed: Acceptable for surgery. (all labs ordered are listed, but only abnormal results are displayed)  Labs Reviewed  SURGICAL PCR SCREEN - Abnormal; Notable for the following components:      Result Value   Staphylococcus aureus POSITIVE (*)    All other components within normal limits  BASIC METABOLIC PANEL - Abnormal; Notable for the following components:   Glucose, Bld 176 (*)    All other components within normal limits  CBC - Abnormal; Notable for the following components:   RBC 4.00 (*)    Hemoglobin 12.6 (*)    Platelets 148 (*)    All other components within normal limits  GLUCOSE, CAPILLARY - Abnormal; Notable for the following components:   Glucose-Capillary 178 (*)    All other components within normal limits   TYPE AND SCREEN     IMAGES:   EKG:   CV: Myocardial Perfusion 03/30/2022   The study is normal. The study is low risk.   No ST deviation was noted.   LV perfusion is normal. There is no evidence of ischemia. There is no evidence of infarction.   Left ventricular function is normal. End diastolic cavity size is normal. End systolic cavity size is normal.   CT attenuation images show evidence of moderate aortic calcifications and minimal coronary calcifications.  Echo 10/07/2021 1. Left ventricular ejection fraction, by estimation, is 55 to 60%. The  left ventricle has normal function. The left ventricle has no regional  wall motion abnormalities. Left ventricular diastolic parameters are  indeterminate.   2. Right ventricular systolic function is normal. The right ventricular  size is not well visualized.   3. Right atrial size was mild to moderately dilated.   4. The mitral valve is grossly normal. Mild mitral valve regurgitation.   5. There is a mobile echodense (11 x 7 mm) mass in the tricuspid valve  apparatus (clip 44, 51). Given history of tricuspid mass, clinical  correlation advised.   6. The aortic valve was not well visualized. Aortic valve regurgitation  is not visualized.   7. The inferior vena cava is dilated in size with <50% respiratory  variability, suggesting right atrial pressure of 15 mmHg.  Past Medical History:  Diagnosis Date   Arthritis    Asthma    Chronic kidney disease    Diabetes mellitus without complication (HCC)    type 2   DJD (degenerative joint disease)    Dysrhythmia    junctional tachycardia and incomplete heart block   Gout    Heart block    Lung nodules    a. 09/2021 CT chest: Interval decrease in size and number of bilateral lung nodules, likely consistent with sequelae associated with an infectious/inflammatory process.   MSSA bacteremia 06/2021   Rotator cuff tear 08/26/2021   Subacute bacterial endocarditis (SBE)    a. 06/2021  TEE: mobile mass attached to the tricuspid valve-->Abx rx-->09/2021 TEE: EF 55-60%, no rwma, nl RV fxn, mild-mod RAE, mild MR, mobile echodense 11x69m mass in the TV apparatus-->conservative rx per TCTS.    Past Surgical History:  Procedure Laterality Date   ACHILLES TENDON REPAIR Left    many years ago per pt   BIOPSY  07/16/2021   Procedure: BIOPSY;  Surgeon: DSharyn Creamer MD;  [facility]  Service: Gastroenterology;;  EGD and COLON   COLONOSCOPY N/A 07/16/2021   Procedure: COLONOSCOPY;  Surgeon: DSharyn Creamer MD;  [facility]  Service: Gastroenterology;  Laterality: N/A;   COLONOSCOPY WITH PROPOFOL N/A 07/16/2021   Procedure: COLONOSCOPY WITH PROPOFOL;  Surgeon: DSharyn Creamer MD;  [place]  Service: Gastroenterology;  Laterality: N/A;   ESOPHAGOGASTRODUODENOSCOPY (EGD) WITH PROPOFOL N/A 07/16/2021   Procedure: ESOPHAGOGASTRODUODENOSCOPY (EGD) WITH PROPOFOL;  Surgeon: DSharyn Creamer MD;  [place]  Service: Gastroenterology;  Laterality: N/A;   FOOT SURGERY Right    ligaments repaired- many years ago per pt   HERNIA REPAIR     JOINT REPLACEMENT Bilateral    hip   POLYPECTOMY  07/16/2021   Procedure: POLYPECTOMY;  Surgeon: DSharyn Creamer MD;  [facility];  Service: Gastroenterology;;   SHOULDER ARTHROSCOPY WITH ROTATOR CUFF REPAIR AND SUBACROMIAL DECOMPRESSION Left 02/10/2022   Procedure: LEFT SHOULDER ARTHROSCOPY WITH EXTENSIVE DEBRIDEMENT, SUBACROMIAL DECOMPRESSION;  Surgeon: BMcarthur Rossetti MD;  [place]  Service: Orthopedics;  Laterality: Left;   TEE WITHOUT CARDIOVERSION N/A 07/07/2021   Procedure: TRANSESOPHAGEAL ECHOCARDIOGRAM (TEE);  Surgeon: AKate Sable MD;  Location: [facility];  Service: Cardiovascular;  Laterality: N/A;    MEDICATIONS:  acetaminophen (TYLENOL) 500 MG tablet   albuterol (VENTOLIN HFA) 108 (90 Base) MCG/ACT inhaler   diclofenac Sodium (VOLTAREN) 1 % GEL    furosemide (LASIX) 20 MG tablet   gabapentin (NEURONTIN) 300 MG capsule   glucose blood (RIGHTEST GS550 BLOOD GLUCOSE) test strip   Insulin Glargine (BASAGLAR KWIKPEN) 100 UNIT/ML   Insulin Pen Needle 32G X 4 MM MISC   ketotifen (ZADITOR) 0.025 % ophthalmic solution   loratadine (CLARITIN) 10 MG tablet   meloxicam (MOBIC) 15 MG tablet   metFORMIN (GLUCOPHAGE) 1000 MG tablet   polyvinyl alcohol (LIQUIFILM TEARS) 1.4 % ophthalmic solution   Rightest GL300 Lancets MISC   traMADol (ULTRAM) 50 MG tablet   No current facility-administered medications for this encounter.    JKonrad FelixWard, PA-C [facility] ([phone]

## 2022-05-19 NOTE — Anesthesia Preprocedure Evaluation (Addendum)
Anesthesia Evaluation  Patient identified by MRN, date of birth, ID band Patient awake    Reviewed: Allergy & Precautions, NPO status , Patient's Chart, lab work & pertinent test results  Airway Mallampati: II  TM Distance: >3 FB Neck ROM: Full    Dental no notable dental hx.    Pulmonary neg pulmonary ROS, former smoker,    Pulmonary exam normal breath sounds clear to auscultation       Cardiovascular negative cardio ROS   Rhythm:Regular Rate:Normal + Systolic murmurs    Neuro/Psych negative neurological ROS  negative psych ROS   GI/Hepatic Neg liver ROS, GERD  ,  Endo/Other  diabetes  Renal/GU negative Renal ROS  negative genitourinary   Musculoskeletal negative musculoskeletal ROS (+)   Abdominal   Peds negative pediatric ROS (+)  Hematology negative hematology ROS (+)   Anesthesia Other Findings   Reproductive/Obstetrics negative OB ROS                            Anesthesia Physical Anesthesia Plan  ASA: 3  Anesthesia Plan: General   Post-op Pain Management: Minimal or no pain anticipated   Induction: Intravenous  PONV Risk Score and Plan: 2 and Ondansetron, Dexamethasone and Treatment may vary due to age or medical condition  Airway Management Planned: Oral ETT  Additional Equipment:   Intra-op Plan:   Post-operative Plan: Extubation in OR  Informed Consent: I have reviewed the patients History and Physical, chart, labs and discussed the procedure including the risks, benefits and alternatives for the proposed anesthesia with the patient or authorized representative who has indicated his/her understanding and acceptance.     Dental advisory given  Plan Discussed with: CRNA and Surgeon  Anesthesia Plan Comments: (See PAT note 05/18/2022)       Anesthesia Quick Evaluation

## 2022-05-19 NOTE — Therapy (Signed)
OUTPATIENT PHYSICAL THERAPY TREATMENT NOTE And Discharge Summary     Patient Name: Joshua Cole MRN: [id number] DOB:Jan 02, 1961, 61 y.o., male Today's Date: 05/19/2022  PCP: No PCP   REFERRING PROVIDER: Dr. Quentin Ore   END OF SESSION:   PT End of Session - 05/19/22 1151     Visit Number 17    Number of Visits 20    Date for PT Re-Evaluation 07/17/22    Authorization Type [employer]    Progress Note Due on Visit 20    PT Start Time 1151    PT Stop Time 1234    PT Time Calculation (min) 43 min    Activity Tolerance Patient tolerated treatment well    Behavior During Therapy WFL for tasks assessed/performed                Past Medical History:  Diagnosis Date   Arthritis    Asthma    Chronic kidney disease    Diabetes mellitus without complication ([place])    type 2   DJD (degenerative joint disease)    Dysrhythmia    junctional tachycardia and incomplete heart block   Gout    Heart block    Lung nodules    a. 09/2021 CT chest: Interval decrease in size and number of bilateral lung nodules, likely consistent with sequelae associated with an infectious/inflammatory process.   MSSA bacteremia 06/2021   Rotator cuff tear 08/26/2021   Subacute bacterial endocarditis (SBE)    a. 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   Past Surgical History:  Procedure Laterality Date   ACHILLES TENDON REPAIR Left    many years ago per pt   BIOPSY  07/16/2021   Procedure: BIOPSY;  Surgeon: DSharyn Creamer MD;  [facility]  Service: Gastroenterology;;  EGD and COLON   COLONOSCOPY N/A 07/16/2021   Procedure: COLONOSCOPY;  Surgeon: DSharyn Creamer MD;  [place]  Service: Gastroenterology;  Laterality: N/A;   COLONOSCOPY WITH PROPOFOL N/A 07/16/2021   Procedure:  COLONOSCOPY WITH PROPOFOL;  Surgeon: DSharyn Creamer MD;  [place]  Service: Gastroenterology;  Laterality: N/A;   ESOPHAGOGASTRODUODENOSCOPY (EGD) WITH PROPOFOL N/A 07/16/2021   Procedure: ESOPHAGOGASTRODUODENOSCOPY (EGD) WITH PROPOFOL;  Surgeon: DSharyn Creamer MD;  [place]  Service: Gastroenterology;  Laterality: N/A;   FOOT SURGERY Right    ligaments repaired- many years ago per pt   HERNIA REPAIR     JOINT REPLACEMENT Bilateral    hip   POLYPECTOMY  07/16/2021   Procedure: POLYPECTOMY;  Surgeon: DSharyn Creamer MD;  [facility];  Service: Gastroenterology;;   SHOULDER ARTHROSCOPY WITH ROTATOR CUFF REPAIR AND SUBACROMIAL DECOMPRESSION Left 02/10/2022   Procedure: LEFT SHOULDER ARTHROSCOPY WITH EXTENSIVE DEBRIDEMENT, SUBACROMIAL DECOMPRESSION;  Surgeon: BMcarthur Rossetti MD;  [place]  Service: Orthopedics;  Laterality: Left;   TEE WITHOUT CARDIOVERSION N/A 07/07/2021   Procedure: TRANSESOPHAGEAL ECHOCARDIOGRAM (TEE);  Surgeon: AKate Sable MD;  Location: [facility];  Service: Cardiovascular;  Laterality: N/A;   Patient Active Problem List   Diagnosis Date Noted   Acid reflux 03/31/2022   Left hip pain 03/31/2022   Complete tear of left rotator cuff 02/10/2022   Headache 11/02/2021   Peripheral neuropathy 10/05/2021   Acute gout 08/26/2021   Rotator cuff tear 08/26/2021   Encounter to  establish care 08/25/2021   Type 2 diabetes mellitus without complications ([place]) 77/93/9030   History of gout 08/25/2021   Left shoulder pain 08/25/2021   Duodenal ulcer    History of colonic polyps    Acute blood loss anemia    Heart block AV complete ([facility])    Abnormal LFTs    MSSA bacteremia 07/08/2021   Acute bacterial endocarditis    Heart block    Bacteremia    Hyponatremia 06/04/3006   Acute metabolic encephalopathy 62/26/3335   Joint pain 07/03/2021   Generalized weakness 07/03/2021   SIRS (systemic inflammatory response  syndrome) ([place]) 07/03/2021   Hypokalemia 07/03/2021   Hyperglycemia 07/03/2021   Thrombocytopenia ([place]) 07/03/2021   Nausea vomiting and diarrhea 07/03/2021   Gout     REFERRING DIAG: Z98.890 (ICD-10-CM) - Status post arthroscopy of left shoulder   THERAPY DIAG:  Acute pain of left shoulder  Rationale for Evaluation and Treatment Rehabilitation  PERTINENT HISTORY: Per note from Dr. [name] on 02/17/22   The patient is following up at 1 week status post a left shoulder arthroscopy with subacromial decompression and extensive debridement.  We found severe synovitis in the shoulder but no rotator cuff tear.  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   I did share with him the arthroscopy pictures showing him the amount of synovitis in his shoulder.  It is essential we get him set up for outpatient physical therapy to work on any modalities that can get his shoulder moving better and decrease his pain.  Incisions look good.  The sutures have been removed.   We will see him back in 4 weeks to see how he is doing overall.  Apparently has a remote history of hip replacements and he needs to have these evaluated.  At his next visit awe will have a AP pelvis and lateral both hips.  PRECAUTIONS: None   SUBJECTIVE: L shoulder feels pretty good, tight, not as much pain. Better able to place towels at the top shelf.        PAIN:  Are you having pain? Yes: NPRS scale: 2/10 Pain location: Left anterior shoulder Pain description: Achy Aggravating factors: moving shoulder overhead  Relieving factors: nothing really helps          VITALS: BP 156/96  HR 97 SpO2 100    DIAGNOSTIC FINDINGS:  CLINICAL DATA:  Left shoulder pain   EXAM: MRI OF THE LEFT SHOULDER WITHOUT AND WITH CONTRAST   TECHNIQUE: Multiplanar,  multisequence MR imaging of the left shoulder was performed before and after the administration of intravenous contrast.   CONTRAST:  39m GADAVIST GADOBUTROL 1 MMOL/ML IV SOLN   COMPARISON:  Left shoulder radiograph 07/12/2021   FINDINGS: Rotator cuff: There is a full-thickness, near full width tear of the supraspinatus tendon at the footprint (coronal T2 image 12). There is high-grade articular sided tearing of the anterior fibers of the infraspinatus tendon at the footprint (sagittal T2 image 4-6). Mild tendinosis of the cephalad fibers of the subscapularis tendon. The teres minor is intact.   Muscles: Intramuscular edema within the infraspinatus and deltoid muscles. No significant muscle atrophy.   Biceps Long Head: Intact intra and extra-articular long head biceps tendon.   Acromioclavicular Joint: Moderate-severe arthropathy of the acromioclavicular joint. There is distension of the subacromial-subdeltoid bursa with thick  peripheral enhancement, short axis measuring up to 0.9 cm. (Coronal T1 post-contrast image 12, sagittal post image 5).   Glenohumeral Joint: Trace glenohumeral joint fluid. Mild chondrosis.   Labrum: Degenerative superior labral tearing extending anteriorly and posteriorly through the biceps labral anchor.   Bones: No fracture or dislocation. No aggressive osseous lesion. No convincing marrow signal changes to suggest osteomyelitis. There is low T1 signal within the scapula, acromion, clavicle, nonspecific.   Other: No fluid collection or hematoma.   IMPRESSION: Full-thickness, near full width tear of the supraspinatus tendon at the footprint. High-grade articular sided tearing of the anterior fibers of the infraspinatus tendon at the footprint. Mild subscapularis tendinosis of the cephalad fibers. No significant muscle atrophy.   Distension of the subacromial-subdeltoid bursa with a peripheral enhancement, short axis measuring up to 0.9 cm.  While this could be related to the patient's rotator cuff tear, given adjacent reactive edema in the deltoid and clinical concern for infection, bursa aspiration should be considered.   Minimal there is minimal glenohumeral joint fluid and without adjacent marrow signal abnormality to suggest osteomyelitis. Low T1 signal within the scapula, acromion and clavicle is nonspecific, possibly related to the patient's anemia.   Degenerative superior labral tearing anteriorly and posteriorly through the biceps labral anchor.   Moderate-severe AC joint arthropathy.     Electronically Signed   By: Maurine Simmering M.D.   On: 07/14/2021 16:59   PATIENT SURVEYS:  FOTO 50/65   COGNITION:           Overall cognitive status: Within functional limits for tasks assessed                                  SENSATION: WFL   POSTURE: Slight round in shoulders    UPPER EXTREMITY ROM:         Active ROM Right eval Left eval  Shoulder flexion 80 60*  Shoulder extension 60 60  Shoulder abduction 80 60*  Shoulder adduction      Shoulder internal rotation 70 50*  Shoulder external rotation 90 70*  Elbow flexion 150 150  Elbow extension 60 60  Wrist flexion 80 80  Wrist extension 70 70  Wrist ulnar deviation 30 30  Wrist radial deviation 20 20  Wrist pronation      Wrist supination      (Blank rows = not tested)      Passive ROM Right eval Left eval  Shoulder flexion 160 180  Shoulder extension 60 60  Shoulder abduction 180  180  Shoulder adduction      Shoulder internal rotation 70 70  Shoulder external rotation 90 90  Elbow flexion 150 150  Elbow extension 60 60  Wrist flexion 80 80  Wrist extension 70 70  Wrist ulnar deviation 30 30  Wrist radial deviation 20 20  Wrist pronation      Wrist supination      (Blank rows = not tested)              UPPER EXTREMITY MMT:   MMT Right eval Left eval  Shoulder flexion 4 3-  Shoulder extension 5 5  Shoulder abduction 4 3-   Shoulder adduction      Shoulder internal rotation 4 4-  Shoulder external rotation 4 4-  Middle trapezius 4 4  Lower trapezius 4 3-  Elbow flexion 5 5  Elbow extension 5 5  Wrist flexion 5  5  Wrist extension 5 5  Wrist ulnar deviation      Wrist radial deviation      Wrist pronation      Wrist supination      Grip strength (lbs)      (Blank rows = not tested)   SHOULDER SPECIAL TESTS:            Impingement tests:  Painful Arch + L               SLAP lesions:  NT            Instability tests:  NT            Rotator cuff assessment:  Infraspinatus + L            Biceps assessment: Speed's Test: + L        JOINT MOBILITY TESTING:  Shoulder Right Inferior Glides Grade III-firm end feel  Shoulder Right AP Glides Grade III-IV - firm end feel    PALPATION:  TTP in anterior of left shoulder              TODAY'S TREATMENT:   05/19/22  R/L shoulder AROM all planes R/L shoulder strength all planes  Shoulder AAROM Abd/Add Pulleys 3 x 10 Shoulder AAROM Flex/Ext Pulleys 3 x 10   Seated Shoulder AAROM Flexion with #3 AW on dowel 1 x 10   Seated Shoulder Abduction with #1 DB 1 x 10  Seated Shoulder Abduction (Scaption) with #1 DB 3 x 10   Seated manually resisted scapular retraction targeting the lower trap muscle  L 10x5 seconds for 2 sets       05/17/22 UBE Seat and Arms 11 with resistance level 2- 5 (2.5 min forward and 2.5 backward) Shoulder AAROM Flex/Ext Pulleys 3 x 10  Shoulder AAROM Abd/Add Pulleys 3 x 10 Supine Shoulder Flexion with LUE #2 1 x 10  Supine Shoulder Flexion with LUE #2 DB 1 x 10  Seated Shoulder AAROM Flexion with #3 AW on dowel 1 x 10   Seated Shoulder Abduction with #1 DB 1 x 10  Seated Shoulder Abduction (Scaption) with #1 DB 3 x 10    05/12/22 UBE Seat and Arms 11 with resistance level 2- 5 (2.5 min forward and 2.5 backward) Shoulder AAROM Flex/Ext Pulleys 3 x 10  Shoulder AAROM Abd/Add Pulleys 3 x 10 Seated LUE forward flexion AROM 1 x  10 Supine Shoulder AAROM Flexion with #7.5 1 x 10  Ball toss with LUE against wall 1 x 10  Chair Push Up for lat and tricep activation 1 x 10  Lat Pull Down with black 1 x 10    05/09/22  UBE Seat and Arms 11 with resistance level 2- 5 (2.5 min forward and 2.5 backward) Shoulder AAROM Flex/Ext Pulleys 3 x 10  Shoulder AAROM Abd/Add Pulleys 3 x 10 Seated LUE forward flexion #1 DB 1 x 10  Supine Shoulder AAROM Flexion with #3 1 x 10  Supine Shoulder AAROM Flexion with #5 1 x 10  -NPS 7/10 in LUE  Supine Shoulder LUE Flexion with #4 1 x 10  Sky Punches on LUE #4 2 x 10  -min VC to maintain left elbow extension throughout exercise  Sky Punches on LUE #5 1 x 10  Bent over single row with #5 DB on LUE 1 x 10  Bent over single row with black TB on LUE 1 x 10   05/05/22 UBE Seat and Arms 8  with resistance level 2- 5 ( 2.5 min forward and 2.5 min backward) Shoulder AAROM Flex/Ext Pulleys 3 x 10  Shoulder AAROM Abd/Add Pulleys 3 x 10 Upper Trap Stretch R & L 3 x 30 sec  Left Bicep Stretch 3 x 30 sec            Right Side Lying Left Shoulder Abduction #1 DB 1 x 10            Right Side Lying Left Shoulder Abduction #2 DB 2 x 10           Right Side Lying Left Shoulder ER at 0 deg abduction #2 3 x 10    05/02/22 UBE Seat and Arms 8 with resistance level 2- 5 ( 2.5 min forward and 2.5 min backward) Shoulder AAROM Flex/Ext Pulleys 3 x 10  Shoulder AAROM Abd/Add Pulleys 3 x 10            Left Shoulder Flexion with AAROM with eccentric movement #1 DB 3 x 10  Left Shoulder Abduction with AAR0M with eccentric movement #1 DB 3 x 10  -Pt applied downward overpressure to prevent pt from elevating left shoulder  Supine Left Shoulder Abduction with #1 DB 2 x 10     PATIENT EDUCATION: Education details: form and technique for appropriate exercise and explanation of plan of care  Person educated: Patient Education method: Customer service manager and handout  Education comprehension:  verbalized understanding, returned demonstration, and verbal cues required     HOME EXERCISE PROGRAM: Access Code: [email or web address] URL: [email or web address] Date: 05/17/2022 Prepared by: Bradly Chris  Exercises - Seated Shoulder Flexion AAROM with Pulley Behind  - 1 x daily - 7 x weekly - 3 sets - 10 reps - Seated Shoulder Abduction AAROM with Pulley Behind  - 1 x daily - 7 x weekly - 3 sets - 10 reps - Seated Upper Trapezius Stretch  - 1 x daily - 7 x weekly - 1 sets - 3 reps - 60 hold - Latissimus Dorsi Stretch at Wall  - 1 x daily - 7 x weekly - 1 sets - 3 reps - 30 hold - Sidelying Shoulder External Rotation  - 1 x daily - 3 x weekly - 3 sets - 10 reps - Prone Shoulder Horizontal Abduction  - 1 x daily - 3 x weekly - 3 sets - 8 reps - Prone W Scapular Retraction  - 1 x daily - 3 x weekly - 3 sets - 8 reps - Standing Bicep Stretch at Wall  - 1 x daily - 7 x weekly - 3 reps - 30 hold - Sidelying Shoulder Abduction Full Range of Motion with Dumbbell  - 1 x daily - 3 x weekly - 3 sets - 10 reps - Supine Scapular Protraction in Flexion with Dumbbells  - 1 x daily - 3 x weekly - 3 sets - 10 reps - Bent Over Single Arm Shoulder Row with Dumbbell  - 1 x daily - 3 x weekly - 3 sets - 10 reps - Standing Lat Pull Down with Resistance - Arms Straight  - 3 x weekly - 3 sets - 10 reps - Seated Shoulder Flexion AAROM with Dowel  - 3 x weekly - 3 sets - 10 reps - Seated Shoulder Scaption with Dumbbells  - 3 x weekly - 3 sets - 10 reps  ASSESSMENT:   CLINICAL IMPRESSION:    Pt demonstrates overall improved L shoulder flexion, and abduction AROM, L shoulder flexion, abduction,  ER and IR strength since initial evaluation. FOTO score seems to fluctuate throughout pt's PT progress reports however. Skilled physical therapy services for his L shoulder discharged secondary to impending hip surgery with pt continuing progress with his home exercise program.       OBJECTIVE IMPAIRMENTS  decreased ROM, decreased strength, hypomobility, impaired UE functional use, and pain.    ACTIVITY LIMITATIONS carrying, lifting, dressing, and reach over head   PARTICIPATION LIMITATIONS: cleaning, occupation, and yard work   PERSONAL FACTORS Fitness, Sex, and Time since onset of injury/illness/exacerbation are also affecting patient's functional outcome.    REHAB POTENTIAL: Good   CLINICAL DECISION MAKING: Stable/uncomplicated   EVALUATION COMPLEXITY: Low     GOALS: Goals reviewed with patient? yes   SHORT TERM GOALS: Target date: 03/21/2022     Pt will be independent with HEP in order to improve strength and balance in order to decrease fall risk and improve function at home and work. Baseline: Performing HEP independently; pt able to perform his HEP, no problem, no questions (05/19/2022) Goal status: Partially Met      LONG TERM GOALS: Target date: 05/16/2022     Patient will have improved function and activity level as evidenced by an increase in FOTO score by 10 points or more.  Baseline: 50/65  04/07/22: 42/65 04/25/22: 57/65 05/12/22 54/65; 48/65 (05/19/2022) Goal status: Ongoing       2.  Patient will improve left shoulder ROM to be close to symmetrical to right shoulder to return to performing overhead activities to complete job related tasks and yard work.  Baseline: Shoulder Flexion R/L 80*/60* , Shoulder Abduction R/L 80*/60*, Shoulder IR R/L 70/50*, Shoulder ER R/L 90/70*   04/25/22: Shoulder Flex R/L 130/60*, Shoulder Abd R/L 160/70*, Shoulder ER R/L 90/40* Shoulder IR R/L 70/70   Shoulder Flex PROM R 160*, Shoulder Abd R 150*;    Shoulder flexion R/L 147/107*, shoulder abduction R/L 165/87, shoulder ER R/L 88/20, shoulder IR R/L 63/48   (05/19/2022)    Goal status: ONGOING      3.  Patient will improve shoulder MMT by >=1/2 muscle grade for improved function to return to completing job related tasks and yard work.  Baseline: Shoulder  Flex R/L 4/3-,Shoulder Abd  R/L 4/3- , Shoulder IR R/L 4/4-, Shoulder ER R/L 4/4-, Lower Trap R/L 4-/3-, Mid Trap R/L 4/4   04/25/22: Shoulder Flex R/L 5/4+ Shoulder Abd R/L 5/4+ Shoulder ER R/L 5/4+, Shoulder IR R/L 5/4+  Mid Trap R/L 4/4  Lower Trap R/L 4-/3-     Shoulder flexion R/L 5/4* (at available range), shoulder abduction R/L 5/4* (at available range), shoulder ER R/L 4/4+    shoulder IR R/L 4/4+, middle trap R/L  3+/3-  Lower trap R/L 3-/3- (05/19/2022)    Goal status: ONGOING         PLAN: PT FREQUENCY: Discharged secondary to hip surgery   PT DURATION:    PLANNED INTERVENTIONS: Therapeutic exercises, Therapeutic activity, Balance training, Patient/Family education, Joint manipulation, Joint mobilization, Aquatic Therapy, Dry Needling, Electrical stimulation, Spinal manipulation, Spinal mobilization, Cryotherapy, Moist heat, Manual therapy, and Re-evaluation   PLAN FOR NEXT SESSION:  Continue progress with his home exercise program.    Thank you for your referral.   Joneen Boers PT, DPT   [facility] [address] [place], [address] Phone: [phone]   Fax:  [phone]  Name: Joshua Cole MRN: [id number] Date of Birth: 02/04/1961

## 2022-05-19 NOTE — Progress Notes (Signed)
PCR: + STAPH °

## 2022-05-19 NOTE — H&P (Signed)
Joshua Cole is an 61 y.o. male.   Chief Complaint:   Left hip pain with known chronic infection  HPI: The patient is a 61 year old gentleman with a complex medical history.  Last November he was admitted to the hospital with MSSA bacteremia and endocarditis.  He is a diabetic with other significant comorbidities.  He has a history of both his hips being replaced in [place] many years ago.  He continued to complain of left hip pain after prolonged hospitalization for his bacteremia.  A bone scan performed this year suggested prosthetic loosening versus infection of his left hip replacement.  A recent aspiration of that left hip showed a significant high white blood cell count and Staphylococcus aureus that was sensitive.  At this point given the chronic nature of this infection, we have recommended excision arthroplasty with removal of all components and placement of an antibiotic spacer.   Past Medical History:  Diagnosis Date   Arthritis    Asthma    Chronic kidney disease    Diabetes mellitus without complication (HCC)    type 2   DJD (degenerative joint disease)    Dysrhythmia    junctional tachycardia and incomplete heart block   Gout    Heart block    Lung nodules    a. 09/2021 CT chest: Interval decrease in size and number of bilateral lung nodules, likely consistent with sequelae associated with an infectious/inflammatory process.   MSSA bacteremia 06/2021   Rotator cuff tear 08/26/2021   Subacute bacterial endocarditis (SBE)    a. 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    Past Surgical History:  Procedure Laterality Date   ACHILLES TENDON REPAIR Left    many years ago per pt   BIOPSY  07/16/2021   Procedure: BIOPSY;  Surgeon: DSharyn Creamer MD;  [facility];  Service: Gastroenterology;;  EGD  and COLON   COLONOSCOPY N/A 07/16/2021   Procedure: COLONOSCOPY;  Surgeon: DSharyn Creamer MD;  [facility]  Service: Gastroenterology;  Laterality: N/A;   COLONOSCOPY WITH PROPOFOL N/A 07/16/2021   Procedure: COLONOSCOPY WITH PROPOFOL;  Surgeon: DSharyn Creamer MD;  [facility]  Service: Gastroenterology;  Laterality: N/A;   ESOPHAGOGASTRODUODENOSCOPY (EGD) WITH PROPOFOL N/A 07/16/2021   Procedure: ESOPHAGOGASTRODUODENOSCOPY (EGD) WITH PROPOFOL;  Surgeon: DSharyn Creamer MD;  [facility]  Service: Gastroenterology;  Laterality: N/A;   FOOT SURGERY Right    ligaments repaired- many years ago per pt   HERNIA REPAIR     JOINT REPLACEMENT Bilateral    hip   POLYPECTOMY  07/16/2021   Procedure: POLYPECTOMY;  Surgeon: DSharyn Creamer MD;  [facility];  Service: Gastroenterology;;   SHOULDER ARTHROSCOPY WITH ROTATOR CUFF REPAIR AND SUBACROMIAL DECOMPRESSION Left 02/10/2022   Procedure: LEFT SHOULDER ARTHROSCOPY WITH EXTENSIVE DEBRIDEMENT, SUBACROMIAL DECOMPRESSION;  Surgeon: BMcarthur Rossetti MD;  [facility]  Service: Orthopedics;  Laterality: Left;   TEE WITHOUT CARDIOVERSION N/A 07/07/2021   Procedure: TRANSESOPHAGEAL ECHOCARDIOGRAM (TEE);  Surgeon: AKate Sable MD;  Location: [facility];  Service: Cardiovascular;  Laterality: N/A;    Family History  Problem Relation Age of Onset   Other Mother        unknown medical history   Lung cancer Father    Psoriasis Sister    Breast cancer Sister    Hypertension  Brother    Hyperlipidemia Brother    Diabetes Brother    Heart attack Brother 20   Social History:  reports that he quit smoking about 16 years ago. His smoking use included cigarettes. He has a 3.75 pack-year smoking history. He has never used smokeless tobacco. He reports that he does not currently use alcohol after a past usage of about 6.0 standard drinks of alcohol per week. He reports that he does not currently use  drugs.  Allergies: No Known Allergies  No medications prior to admission.    Results for orders placed or performed during the hospital encounter of 05/18/22 (from the past 48 hour(s))  Glucose, capillary     Status: Abnormal   Collection Time: 05/18/22 10:34 AM  Result Value Ref Range   Glucose-Capillary 178 (H) 70 - 99 mg/dL    Comment: Glucose reference range applies only to samples taken after fasting for at least 8 hours.  Type and screen Order type and screen if day of surgery is less than 15 days from draw of preadmission visit or order morning of surgery if day of surgery is greater than 6 days from preadmission visit.     Status: None (Preliminary result)   Collection Time: 05/18/22 10:45 AM  Result Value Ref Range   ABO/RH(D) O POS    Antibody Screen POS    Sample Expiration 05/21/2022,2359    Extend sample reason NO TRANSFUSIONS OR PREGNANCY IN THE PAST 3 MONTHS    DAT, IgG NEG    Antibody Identification ANTI E    PT AG Type NEGATIVE FOR E ANTIGEN    Unit Number [id number]    Blood Component Type RED CELLS,LR    Unit division 00    Status of Unit ALLOCATED    Transfusion Status OK TO TRANSFUSE    Crossmatch Result      COMPATIBLE Performed at [facility] [address] [place], [place] [address]    Unit Number [id number]    Blood Component Type RED CELLS,LR    Unit division 00    Status of Unit ALLOCATED    Transfusion Status OK TO TRANSFUSE    Crossmatch Result COMPATIBLE   Basic metabolic panel per protocol     Status: Abnormal   Collection Time: 05/18/22 10:45 AM  Result Value Ref Range   Sodium 135 135 - 145 mmol/L   Potassium 4.3 3.5 - 5.1 mmol/L   Chloride 103 98 - 111 mmol/L   CO2 24 22 - 32 mmol/L   Glucose, Bld 176 (H) 70 - 99 mg/dL    Comment: Glucose reference range applies only to samples taken after fasting for at least 8 hours.   BUN 10 6 - 20 mg/dL   Creatinine, Ser 0.71 0.61 - 1.24 mg/dL   Calcium 9.4 8.9 - 10.3  mg/dL   GFR, Estimated >60 >60 mL/min    Comment: (NOTE) Calculated using the CKD-EPI Creatinine Equation (2021)    Anion gap 8 5 - 15    Comment: Performed at [facility], [place] [address], [place], [place] [address]  CBC per protocol     Status: Abnormal   Collection Time: 05/18/22 10:45 AM  Result Value Ref Range   WBC 8.6 4.0 - 10.5 K/uL   RBC 4.00 (L) 4.22 - 5.81 MIL/uL   Hemoglobin 12.6 (L) 13.0 - 17.0 g/dL   HCT 39.6 39.0 - 52.0 %   MCV 99.0 80.0 - 100.0 fL   MCH  31.5 26.0 - 34.0 pg   MCHC 31.8 30.0 - 36.0 g/dL   RDW 13.8 11.5 - 15.5 %   Platelets 148 (L) 150 - 400 K/uL   nRBC 0.0 0.0 - 0.2 %    Comment: Performed at [facility], [place] [address], [place], [place] [address]  Surgical pcr screen     Status: Abnormal   Collection Time: 05/18/22  1:37 PM   Specimen: Nasal Mucosa; Nasal Swab  Result Value Ref Range   MRSA, PCR NEGATIVE NEGATIVE   Staphylococcus aureus POSITIVE (A) NEGATIVE    Comment: (NOTE) The Xpert SA Assay (FDA approved for NASAL specimens in patients 71 years of age and older), is one component of a comprehensive surveillance program. It is not intended to diagnose infection nor to guide or monitor treatment. Performed at [facility], [place] [address], [place], [place] [address]    No results found.  Review of Systems  There were no vitals taken for this visit. Physical Exam Vitals reviewed.  Constitutional:      Appearance: Normal appearance.  HENT:     Head: Normocephalic and atraumatic.  Eyes:     Extraocular Movements: Extraocular movements intact.     Pupils: Pupils are equal, round, and reactive to light.  Cardiovascular:     Rate and Rhythm: Normal rate.  Pulmonary:     Effort: Pulmonary effort is normal.  Abdominal:     Palpations: Abdomen is soft.  Musculoskeletal:     Cervical back: Normal range of motion and neck supple.     Left hip: Bony tenderness present.   Neurological:     Mental Status: He is alert and oriented to person, place, and time.  Psychiatric:        Behavior: Behavior normal.      Assessment/Plan Chronic left hip prosthetic joint infection  The patient has been spoken to in length about the need for an excision arthroplasty of all components of the left hip given the nature of his infection.  He will then need an antibiotic spacer and will need to be admitted for long-term IV antibiotics.  The risks and benefits of surgery have been explained in significant detail including the risk of continued infection, fracture, nerve and vessel injury and failure to eradicate infection.  Mcarthur Rossetti, MD 05/19/2022, 9:24 PM

## 2022-05-20 ENCOUNTER — Ambulatory Visit (HOSPITAL_COMMUNITY): Payer: Medicaid Other | Admitting: Certified Registered"

## 2022-05-20 ENCOUNTER — Inpatient Hospital Stay (HOSPITAL_COMMUNITY): Payer: Medicaid Other

## 2022-05-20 ENCOUNTER — Encounter (HOSPITAL_COMMUNITY)
Admission: RE | Disposition: A | Discharge status: Skilled Nursing Facility | Payer: Self-pay | Source: Ambulatory Visit | Attending: Orthopaedic Surgery

## 2022-05-20 ENCOUNTER — Encounter (HOSPITAL_COMMUNITY): Payer: Self-pay | Admitting: Orthopaedic Surgery

## 2022-05-20 ENCOUNTER — Other Ambulatory Visit: Payer: Self-pay

## 2022-05-20 ENCOUNTER — Ambulatory Visit (HOSPITAL_COMMUNITY): Payer: Medicaid Other

## 2022-05-20 ENCOUNTER — Ambulatory Visit: Payer: Self-pay

## 2022-05-20 ENCOUNTER — Inpatient Hospital Stay (HOSPITAL_COMMUNITY)
Admission: RE | Admit: 2022-05-20 | Discharge: 2022-05-27 | DRG: 468 | Disposition: A | Discharge status: Skilled Nursing Facility | Payer: Medicaid Other | Source: Ambulatory Visit | Attending: Orthopaedic Surgery | Admitting: Orthopaedic Surgery

## 2022-05-20 ENCOUNTER — Ambulatory Visit (HOSPITAL_COMMUNITY): Payer: Medicaid Other | Admitting: Physician Assistant

## 2022-05-20 DIAGNOSIS — Z7984 Long term (current) use of oral hypoglycemic drugs: Secondary | ICD-10-CM | POA: Diagnosis not present

## 2022-05-20 DIAGNOSIS — Y831 Surgical operation with implant of artificial internal device as the cause of abnormal reaction of the patient, or of later complication, without mention of misadventure at the time of the procedure: Secondary | ICD-10-CM | POA: Diagnosis present

## 2022-05-20 DIAGNOSIS — Y793 Surgical instruments, materials and orthopedic devices (including sutures) associated with adverse incidents: Secondary | ICD-10-CM | POA: Diagnosis not present

## 2022-05-20 DIAGNOSIS — Z87891 Personal history of nicotine dependence: Secondary | ICD-10-CM

## 2022-05-20 DIAGNOSIS — Z801 Family history of malignant neoplasm of trachea, bronchus and lung: Secondary | ICD-10-CM

## 2022-05-20 DIAGNOSIS — Y838 Other surgical procedures as the cause of abnormal reaction of the patient, or of later complication, without mention of misadventure at the time of the procedure: Secondary | ICD-10-CM | POA: Diagnosis not present

## 2022-05-20 DIAGNOSIS — Z83438 Family history of other disorder of lipoprotein metabolism and other lipidemia: Secondary | ICD-10-CM

## 2022-05-20 DIAGNOSIS — Z803 Family history of malignant neoplasm of breast: Secondary | ICD-10-CM | POA: Diagnosis not present

## 2022-05-20 DIAGNOSIS — M96662 Fracture of femur following insertion of orthopedic implant, joint prosthesis, or bone plate, left leg: Secondary | ICD-10-CM | POA: Diagnosis not present

## 2022-05-20 DIAGNOSIS — T8452XA Infection and inflammatory reaction due to internal left hip prosthesis, initial encounter: Secondary | ICD-10-CM

## 2022-05-20 DIAGNOSIS — Z79899 Other long term (current) drug therapy: Secondary | ICD-10-CM

## 2022-05-20 DIAGNOSIS — E119 Type 2 diabetes mellitus without complications: Secondary | ICD-10-CM | POA: Diagnosis present

## 2022-05-20 DIAGNOSIS — R7881 Bacteremia: Secondary | ICD-10-CM | POA: Diagnosis not present

## 2022-05-20 DIAGNOSIS — B9561 Methicillin susceptible Staphylococcus aureus infection as the cause of diseases classified elsewhere: Secondary | ICD-10-CM | POA: Diagnosis not present

## 2022-05-20 DIAGNOSIS — T8452XD Infection and inflammatory reaction due to internal left hip prosthesis, subsequent encounter: Secondary | ICD-10-CM | POA: Diagnosis not present

## 2022-05-20 DIAGNOSIS — Z833 Family history of diabetes mellitus: Secondary | ICD-10-CM

## 2022-05-20 DIAGNOSIS — Z8249 Family history of ischemic heart disease and other diseases of the circulatory system: Secondary | ICD-10-CM | POA: Diagnosis not present

## 2022-05-20 DIAGNOSIS — M75102 Unspecified rotator cuff tear or rupture of left shoulder, not specified as traumatic: Secondary | ICD-10-CM | POA: Diagnosis not present

## 2022-05-20 DIAGNOSIS — Z01818 Encounter for other preprocedural examination: Secondary | ICD-10-CM

## 2022-05-20 DIAGNOSIS — I079 Rheumatic tricuspid valve disease, unspecified: Secondary | ICD-10-CM | POA: Diagnosis not present

## 2022-05-20 HISTORY — PX: EXCISIONAL TOTAL HIP ARTHROPLASTY WITH ANTIBIOTIC SPACERS: SHX5826

## 2022-05-20 LAB — TYPE AND SCREEN
ABO/RH(D): O POS
Antibody Screen: POSITIVE
DAT, IgG: NEGATIVE
PT AG Type: NEGATIVE
Unit division: 0
Unit division: 0

## 2022-05-20 LAB — PREPARE RBC (CROSSMATCH)

## 2022-05-20 LAB — GLUCOSE, CAPILLARY
Glucose-Capillary: 105 mg/dL — ABNORMAL HIGH (ref 70–99)
Glucose-Capillary: 124 mg/dL — ABNORMAL HIGH (ref 70–99)
Glucose-Capillary: 275 mg/dL — ABNORMAL HIGH (ref 70–99)

## 2022-05-20 LAB — BPAM RBC
Blood Product Expiration Date: 202310132359
Blood Product Expiration Date: 202310132359
Unit Type and Rh: 9500
Unit Type and Rh: 9500

## 2022-05-20 LAB — HEMOGLOBIN AND HEMATOCRIT, BLOOD
HCT: 27.1 % — ABNORMAL LOW (ref 39.0–52.0)
Hemoglobin: 8.5 g/dL — ABNORMAL LOW (ref 13.0–17.0)

## 2022-05-20 SURGERY — EXCISIONAL TOTAL HIP ARTHROPLASTY WITH ANTIBIOTIC SPACERS
Anesthesia: General | Site: Hip | Laterality: Left

## 2022-05-20 MED ORDER — PROPOFOL 500 MG/50ML IV EMUL
INTRAVENOUS | Status: AC
  Filled 2022-05-20: qty 50

## 2022-05-20 MED ORDER — ACETAMINOPHEN 325 MG PO TABS: 325.0000 mg | ORAL_TABLET | Freq: Four times a day (QID) | ORAL | Status: DC | PRN

## 2022-05-20 MED ORDER — ALBUTEROL SULFATE (2.5 MG/3ML) 0.083% IN NEBU: 3.0000 mL | INHALATION_SOLUTION | Freq: Four times a day (QID) | RESPIRATORY_TRACT | Status: DC | PRN

## 2022-05-20 MED ORDER — VANCOMYCIN HCL 1000 MG IV SOLR
INTRAVENOUS | Status: DC | PRN
  Administered 2022-05-20: 6 g

## 2022-05-20 MED ORDER — OXYCODONE HCL 5 MG PO TABS: 5.0000 mg | ORAL_TABLET | Freq: Once | ORAL | Status: DC | PRN

## 2022-05-20 MED ORDER — HYDROMORPHONE HCL 2 MG PO TABS
2.0000 mg | ORAL_TABLET | ORAL | Status: DC | PRN
  Administered 2022-05-26 – 2022-05-27 (×5): 2 mg via ORAL
  Filled 2022-05-20 (×6): qty 1

## 2022-05-20 MED ORDER — SODIUM CHLORIDE 0.9 % IV SOLN: INTRAVENOUS | Status: DC

## 2022-05-20 MED ORDER — PROPOFOL 10 MG/ML IV BOLUS
INTRAVENOUS | Status: DC | PRN
  Administered 2022-05-20: 30 mg via INTRAVENOUS

## 2022-05-20 MED ORDER — PHENYLEPHRINE HCL-NACL 20-0.9 MG/250ML-% IV SOLN
INTRAVENOUS | Status: DC | PRN
  Administered 2022-05-20: 50 ug/min via INTRAVENOUS

## 2022-05-20 MED ORDER — PHENYLEPHRINE HCL (PRESSORS) 10 MG/ML IV SOLN
INTRAVENOUS | Status: AC
  Filled 2022-05-20: qty 1

## 2022-05-20 MED ORDER — ONDANSETRON HCL 4 MG/2ML IJ SOLN
4.0000 mg | Freq: Four times a day (QID) | INTRAMUSCULAR | Status: DC | PRN
  Administered 2022-05-22 – 2022-05-23 (×2): 4 mg via INTRAVENOUS
  Filled 2022-05-20 (×2): qty 2

## 2022-05-20 MED ORDER — VANCOMYCIN HCL 1000 MG IV SOLR
INTRAVENOUS | Status: DC | PRN
  Administered 2022-05-20: 1000 mg via TOPICAL

## 2022-05-20 MED ORDER — OXYCODONE HCL 5 MG PO TABS
5.0000 mg | ORAL_TABLET | ORAL | Status: DC | PRN
  Administered 2022-05-20 – 2022-05-22 (×7): 10 mg via ORAL
  Administered 2022-05-22: 5 mg via ORAL
  Administered 2022-05-22: 10 mg via ORAL
  Administered 2022-05-22 – 2022-05-26 (×21): 5 mg via ORAL
  Filled 2022-05-20: qty 2
  Filled 2022-05-20 (×4): qty 1
  Filled 2022-05-20: qty 2
  Filled 2022-05-20 (×9): qty 1
  Filled 2022-05-20: qty 2
  Filled 2022-05-20 (×5): qty 1
  Filled 2022-05-20: qty 2
  Filled 2022-05-20: qty 1
  Filled 2022-05-20 (×2): qty 2
  Filled 2022-05-20: qty 1
  Filled 2022-05-20: qty 2
  Filled 2022-05-20: qty 1
  Filled 2022-05-20: qty 2
  Filled 2022-05-20: qty 1
  Filled 2022-05-20: qty 2

## 2022-05-20 MED ORDER — CEFAZOLIN SODIUM-DEXTROSE 2-3 GM-%(50ML) IV SOLR
INTRAVENOUS | Status: DC | PRN
  Administered 2022-05-20: 2 g via INTRAVENOUS

## 2022-05-20 MED ORDER — ORAL CARE MOUTH RINSE: 15.0000 mL | Freq: Once | OROMUCOSAL | Status: AC

## 2022-05-20 MED ORDER — INSULIN ASPART 100 UNIT/ML IJ SOLN
0.0000 [IU] | Freq: Every day | INTRAMUSCULAR | Status: DC
  Administered 2022-05-20: 3 [IU] via SUBCUTANEOUS

## 2022-05-20 MED ORDER — ASPIRIN 81 MG PO CHEW
81.0000 mg | CHEWABLE_TABLET | Freq: Two times a day (BID) | ORAL | Status: DC
  Administered 2022-05-20 – 2022-05-27 (×14): 81 mg via ORAL
  Filled 2022-05-20 (×14): qty 1

## 2022-05-20 MED ORDER — PROPOFOL 500 MG/50ML IV EMUL
INTRAVENOUS | Status: DC | PRN
  Administered 2022-05-20: 125 ug/kg/min via INTRAVENOUS

## 2022-05-20 MED ORDER — INSULIN ASPART 100 UNIT/ML IJ SOLN
0.0000 [IU] | Freq: Three times a day (TID) | INTRAMUSCULAR | Status: DC
  Administered 2022-05-21 (×3): 2 [IU] via SUBCUTANEOUS
  Administered 2022-05-22 – 2022-05-23 (×4): 1 [IU] via SUBCUTANEOUS
  Administered 2022-05-23 – 2022-05-24 (×5): 2 [IU] via SUBCUTANEOUS
  Administered 2022-05-25: 1 [IU] via SUBCUTANEOUS
  Administered 2022-05-25 (×2): 2 [IU] via SUBCUTANEOUS
  Administered 2022-05-26 (×3): 1 [IU] via SUBCUTANEOUS
  Administered 2022-05-27: 2 [IU] via SUBCUTANEOUS
  Administered 2022-05-27: 1 [IU] via SUBCUTANEOUS
  Administered 2022-05-27: 2 [IU] via SUBCUTANEOUS

## 2022-05-20 MED ORDER — SODIUM CHLORIDE 0.9% FLUSH
10.0000 mL | INTRAVENOUS | Status: DC | PRN
  Administered 2022-05-24 – 2022-05-25 (×2): 10 mL

## 2022-05-20 MED ORDER — CEFAZOLIN SODIUM-DEXTROSE 2-4 GM/100ML-% IV SOLN
2.0000 g | Freq: Three times a day (TID) | INTRAVENOUS | Status: DC
  Administered 2022-05-20 – 2022-05-22 (×5): 2 g via INTRAVENOUS
  Filled 2022-05-20 (×5): qty 100

## 2022-05-20 MED ORDER — HYDROMORPHONE HCL 1 MG/ML IJ SOLN: 0.5000 mg | INTRAMUSCULAR | Status: DC | PRN

## 2022-05-20 MED ORDER — METOCLOPRAMIDE HCL 5 MG PO TABS: 5.0000 mg | ORAL_TABLET | Freq: Three times a day (TID) | ORAL | Status: DC | PRN

## 2022-05-20 MED ORDER — POLYVINYL ALCOHOL 1.4 % OP SOLN
1.0000 [drp] | OPHTHALMIC | Status: DC | PRN
Start: 2022-05-20 — End: 2022-05-28

## 2022-05-20 MED ORDER — 0.9 % SODIUM CHLORIDE (POUR BTL) OPTIME
TOPICAL | Status: DC | PRN
  Administered 2022-05-20: 1000 mL

## 2022-05-20 MED ORDER — TRANEXAMIC ACID-NACL 1000-0.7 MG/100ML-% IV SOLN
1000.0000 mg | INTRAVENOUS | Status: AC
  Administered 2022-05-20: 1000 mg via INTRAVENOUS
  Filled 2022-05-20: qty 100

## 2022-05-20 MED ORDER — VANCOMYCIN HCL 1000 MG IV SOLR
INTRAVENOUS | Status: AC
  Filled 2022-05-20: qty 80

## 2022-05-20 MED ORDER — FENTANYL CITRATE (PF) 100 MCG/2ML IJ SOLN
INTRAMUSCULAR | Status: DC | PRN
Start: 2022-05-20 — End: 2022-05-20
  Administered 2022-05-20: 100 ug via INTRAVENOUS

## 2022-05-20 MED ORDER — VANCOMYCIN HCL 1000 MG IV SOLR
INTRAVENOUS | Status: AC
  Filled 2022-05-20: qty 40

## 2022-05-20 MED ORDER — CHLORHEXIDINE GLUCONATE CLOTH 2 % EX PADS
6.0000 | MEDICATED_PAD | Freq: Every day | CUTANEOUS | Status: DC
  Administered 2022-05-21 – 2022-05-27 (×7): 6 via TOPICAL

## 2022-05-20 MED ORDER — ALUM & MAG HYDROXIDE-SIMETH 200-200-20 MG/5ML PO SUSP: 30.0000 mL | ORAL | Status: DC | PRN

## 2022-05-20 MED ORDER — DEXAMETHASONE SODIUM PHOSPHATE 10 MG/ML IJ SOLN
INTRAMUSCULAR | Status: AC
  Filled 2022-05-20: qty 1

## 2022-05-20 MED ORDER — ORAL CARE MOUTH RINSE: 15.0000 mL | OROMUCOSAL | Status: DC | PRN

## 2022-05-20 MED ORDER — HYDROMORPHONE HCL 1 MG/ML IJ SOLN
INTRAMUSCULAR | Status: AC
  Administered 2022-05-20: 0.25 mg via INTRAVENOUS
  Filled 2022-05-20: qty 1

## 2022-05-20 MED ORDER — ONDANSETRON HCL 4 MG/2ML IJ SOLN: 4.0000 mg | Freq: Once | INTRAMUSCULAR | Status: DC | PRN

## 2022-05-20 MED ORDER — ACETAMINOPHEN 10 MG/ML IV SOLN: 1000.0000 mg | Freq: Once | INTRAVENOUS | Status: DC | PRN

## 2022-05-20 MED ORDER — METHOCARBAMOL 500 MG IVPB - SIMPLE MED: 500.0000 mg | Freq: Four times a day (QID) | INTRAVENOUS | Status: DC | PRN

## 2022-05-20 MED ORDER — CEFAZOLIN SODIUM-DEXTROSE 2-4 GM/100ML-% IV SOLN
INTRAVENOUS | Status: AC
  Filled 2022-05-20: qty 100

## 2022-05-20 MED ORDER — ALBUMIN HUMAN 5 % IV SOLN: 12.5000 g | Freq: Once | INTRAVENOUS | Status: AC

## 2022-05-20 MED ORDER — ALBUMIN HUMAN 5 % IV SOLN: INTRAVENOUS | Status: DC | PRN

## 2022-05-20 MED ORDER — ONDANSETRON HCL 4 MG PO TABS
4.0000 mg | ORAL_TABLET | Freq: Four times a day (QID) | ORAL | Status: DC | PRN
  Administered 2022-05-22: 4 mg via ORAL
  Filled 2022-05-20 (×2): qty 1

## 2022-05-20 MED ORDER — VANCOMYCIN HCL 1000 MG IV SOLR
INTRAVENOUS | Status: AC
  Filled 2022-05-20: qty 20

## 2022-05-20 MED ORDER — PANTOPRAZOLE SODIUM 40 MG PO TBEC
40.0000 mg | DELAYED_RELEASE_TABLET | Freq: Every day | ORAL | Status: DC
  Administered 2022-05-20 – 2022-05-27 (×7): 40 mg via ORAL
  Filled 2022-05-20 (×8): qty 1

## 2022-05-20 MED ORDER — TRANEXAMIC ACID 1000 MG/10ML IV SOLN
2000.0000 mg | Freq: Once | INTRAVENOUS | Status: AC
Start: 2022-05-20 — End: 2022-05-20
  Filled 2022-05-20: qty 20

## 2022-05-20 MED ORDER — METHOCARBAMOL 500 MG PO TABS
500.0000 mg | ORAL_TABLET | Freq: Four times a day (QID) | ORAL | Status: DC | PRN
  Administered 2022-05-21 – 2022-05-27 (×15): 500 mg via ORAL
  Filled 2022-05-20 (×16): qty 1

## 2022-05-20 MED ORDER — ALBUMIN HUMAN 5 % IV SOLN
INTRAVENOUS | Status: AC
  Filled 2022-05-20: qty 250

## 2022-05-20 MED ORDER — STERILE WATER FOR IRRIGATION IR SOLN
Status: DC | PRN
  Administered 2022-05-20: 2000 mL

## 2022-05-20 MED ORDER — PHENOL 1.4 % MT LIQD
1.0000 | OROMUCOSAL | Status: DC | PRN
Start: 2022-05-20 — End: 2022-05-28

## 2022-05-20 MED ORDER — MENTHOL 3 MG MT LOZG: 1.0000 | LOZENGE | OROMUCOSAL | Status: DC | PRN

## 2022-05-20 MED ORDER — POVIDONE-IODINE 10 % EX SWAB
2.0000 | Freq: Once | CUTANEOUS | Status: AC
  Administered 2022-05-20: 2 via TOPICAL

## 2022-05-20 MED ORDER — GABAPENTIN 300 MG PO CAPS
300.0000 mg | ORAL_CAPSULE | Freq: Two times a day (BID) | ORAL | Status: DC
  Administered 2022-05-20 – 2022-05-27 (×14): 300 mg via ORAL
  Filled 2022-05-20 (×14): qty 1

## 2022-05-20 MED ORDER — SODIUM CHLORIDE 0.9% FLUSH
10.0000 mL | Freq: Two times a day (BID) | INTRAVENOUS | Status: DC
  Administered 2022-05-20: 5 mL
  Administered 2022-05-21 – 2022-05-27 (×3): 10 mL

## 2022-05-20 MED ORDER — LACTATED RINGERS IV SOLN: INTRAVENOUS | Status: DC

## 2022-05-20 MED ORDER — BUPIVACAINE IN DEXTROSE 0.75-8.25 % IT SOLN
INTRATHECAL | Status: DC | PRN
  Administered 2022-05-20: 2 mL via INTRATHECAL

## 2022-05-20 MED ORDER — TRANEXAMIC ACID 1000 MG/10ML IV SOLN
INTRAVENOUS | Status: DC | PRN
  Administered 2022-05-20: 2000 mg via TOPICAL

## 2022-05-20 MED ORDER — OXYCODONE HCL 5 MG/5ML PO SOLN: 5.0000 mg | Freq: Once | ORAL | Status: DC | PRN

## 2022-05-20 MED ORDER — SODIUM CHLORIDE 0.9 % IR SOLN
Status: DC | PRN
  Administered 2022-05-20: 6000 mL

## 2022-05-20 MED ORDER — HYDROMORPHONE HCL 1 MG/ML IJ SOLN
0.2500 mg | INTRAMUSCULAR | Status: DC | PRN
  Administered 2022-05-20 (×3): 0.25 mg via INTRAVENOUS

## 2022-05-20 MED ORDER — CHLORHEXIDINE GLUCONATE 0.12 % MT SOLN
15.0000 mL | Freq: Once | OROMUCOSAL | Status: AC
  Administered 2022-05-20: 15 mL via OROMUCOSAL

## 2022-05-20 MED ORDER — INSULIN GLARGINE-YFGN 100 UNIT/ML ~~LOC~~ SOLN
15.0000 [IU] | Freq: Every day | SUBCUTANEOUS | Status: DC
Start: 2022-05-20 — End: 2022-05-28
  Administered 2022-05-20 – 2022-05-26 (×7): 15 [IU] via SUBCUTANEOUS
  Filled 2022-05-20 (×8): qty 0.15

## 2022-05-20 MED ORDER — ALBUMIN HUMAN 5 % IV SOLN
INTRAVENOUS | Status: AC
  Administered 2022-05-20: 12.5 g via INTRAVENOUS
  Filled 2022-05-20: qty 250

## 2022-05-20 MED ORDER — PHENYLEPHRINE 80 MCG/ML (10ML) SYRINGE FOR IV PUSH (FOR BLOOD PRESSURE SUPPORT)
PREFILLED_SYRINGE | INTRAVENOUS | Status: DC | PRN
  Administered 2022-05-20: 160 ug via INTRAVENOUS

## 2022-05-20 MED ORDER — METOCLOPRAMIDE HCL 5 MG/ML IJ SOLN: 5.0000 mg | Freq: Three times a day (TID) | INTRAMUSCULAR | Status: DC | PRN

## 2022-05-20 MED ORDER — FENTANYL CITRATE (PF) 100 MCG/2ML IJ SOLN
INTRAMUSCULAR | Status: AC
  Filled 2022-05-20: qty 2

## 2022-05-20 MED ORDER — METFORMIN HCL 500 MG PO TABS
500.0000 mg | ORAL_TABLET | Freq: Two times a day (BID) | ORAL | Status: DC
  Administered 2022-05-21 – 2022-05-27 (×14): 500 mg via ORAL
  Filled 2022-05-20 (×14): qty 1

## 2022-05-20 MED ORDER — DOCUSATE SODIUM 100 MG PO CAPS
100.0000 mg | ORAL_CAPSULE | Freq: Two times a day (BID) | ORAL | Status: DC
  Administered 2022-05-20 – 2022-05-27 (×14): 100 mg via ORAL
  Filled 2022-05-20 (×14): qty 1

## 2022-05-20 MED ORDER — DIPHENHYDRAMINE HCL 50 MG/ML IJ SOLN
INTRAMUSCULAR | Status: AC
  Filled 2022-05-20: qty 1

## 2022-05-20 MED ORDER — ONDANSETRON HCL 4 MG/2ML IJ SOLN
INTRAMUSCULAR | Status: AC
  Filled 2022-05-20: qty 2

## 2022-05-20 SURGICAL SUPPLY — 46 items
BAG COUNTER SPONGE SURGICOUNT (BAG) ×1 IMPLANT
BAG ZIPLOCK 12X15 (MISCELLANEOUS) IMPLANT
BENZOIN TINCTURE PRP APPL 2/3 (GAUZE/BANDAGES/DRESSINGS) IMPLANT
BLADE SAW SGTL 18X1.27X75 (BLADE) ×1 IMPLANT
BONE CEMENT GENTAMICIN (Cement) ×4 IMPLANT
BRUSH FEMORAL CANAL (MISCELLANEOUS) IMPLANT
CEMENT BONE GENTAMICIN 40 (Cement) IMPLANT
COVER PERINEAL POST (MISCELLANEOUS) ×1 IMPLANT
COVER SURGICAL LIGHT HANDLE (MISCELLANEOUS) ×1 IMPLANT
DRAPE FOOT SWITCH (DRAPES) ×1 IMPLANT
DRAPE STERI IOBAN 125X83 (DRAPES) ×1 IMPLANT
DRAPE U-SHAPE 47X51 STRL (DRAPES) ×2 IMPLANT
DRSG AQUACEL AG ADV 3.5X10 (GAUZE/BANDAGES/DRESSINGS) ×1 IMPLANT
DURAPREP 26ML APPLICATOR (WOUND CARE) ×1 IMPLANT
ELECT REM PT RETURN 15FT ADLT (MISCELLANEOUS) ×1 IMPLANT
GAUZE XEROFORM 1X8 LF (GAUZE/BANDAGES/DRESSINGS) ×1 IMPLANT
GLOVE BIO SURGEON STRL SZ7.5 (GLOVE) ×1 IMPLANT
GLOVE BIOGEL PI IND STRL 8 (GLOVE) ×2 IMPLANT
GLOVE ECLIPSE 8.0 STRL XLNG CF (GLOVE) ×1 IMPLANT
GOWN STRL REUS W/ TWL XL LVL3 (GOWN DISPOSABLE) ×2 IMPLANT
GOWN STRL REUS W/TWL XL LVL3 (GOWN DISPOSABLE) ×2
HANDPIECE INTERPULSE COAX TIP (DISPOSABLE) ×1
HEAD FEM STD 32X+13 STRL (Hips) IMPLANT
HEAD FEM STD 32X+5 STRL (Hips) IMPLANT
HOLDER FOLEY CATH W/STRAP (MISCELLANEOUS) ×1 IMPLANT
IMMOBILIZER KNEE 22 UNIV (SOFTGOODS) IMPLANT
KIT HIP STEM REMOVAL BLADES (KITS) IMPLANT
KIT TURNOVER KIT A (KITS) IMPLANT
LINER ACET CUP 42MMX32MM (Hips) IMPLANT
OSTEOTOME THIN 8 5 (MISCELLANEOUS) IMPLANT
PACK ANTERIOR HIP CUSTOM (KITS) ×1 IMPLANT
SET HNDPC FAN SPRY TIP SCT (DISPOSABLE) ×1 IMPLANT
SOLUTION PRONTOSAN WOUND 350ML (IRRIGATION / IRRIGATOR) IMPLANT
SPONGE T-LAP 18X18 ~~LOC~~+RFID (SPONGE) IMPLANT
STAPLER VISISTAT 35W (STAPLE) IMPLANT
STEM CEMENTED SUMMIT SZ2 (Stem) IMPLANT
STRIP CLOSURE SKIN 1/2X4 (GAUZE/BANDAGES/DRESSINGS) IMPLANT
SUT ETHIBOND NAB CT1 #1 30IN (SUTURE) ×1 IMPLANT
SUT ETHILON 2 0 PS N (SUTURE) IMPLANT
SUT MNCRL AB 4-0 PS2 18 (SUTURE) IMPLANT
SUT VIC AB 0 CT1 36 (SUTURE) ×1 IMPLANT
SUT VIC AB 1 CT1 36 (SUTURE) ×1 IMPLANT
SUT VIC AB 2-0 CT1 27 (SUTURE) ×2
SUT VIC AB 2-0 CT1 TAPERPNT 27 (SUTURE) ×2 IMPLANT
TRAY FOLEY MTR SLVR 16FR STAT (SET/KITS/TRAYS/PACK) IMPLANT
YANKAUER SUCT BULB TIP NO VENT (SUCTIONS) ×1 IMPLANT

## 2022-05-20 NOTE — Op Note (Signed)
Operative Note  Date of operation: 05/20/2022 Preoperative diagnosis: Infected left total hip arthroplasty Postoperative diagnosis: Same  Procedure: #1 removal of total hip arthroplasty (all components)         #2 extensive irrigation and debridement of left hip         #3 placement of temporary antibiotic spacer left hip  Implants: Prostalac 42 x 32 mm cup (DePuy), size 2 Summit basic stem, 32+13 metal hip ball  Surgeon: Lind Guest. [name], MD Assistant: [name], PA-C  Anesthesia: Spinal Antibiotics: 2 g IV Ancef EBL: 7846 cc Complications: None  Indications: The patient is a 61 year old gentleman who has a history of both his hips being replaced in [place] around 14 years ago.  The patient presented to the hospital in November of last year bacteremic with MSSA staph bacteremia.  He also developed endocarditis and was very sick.  He eventually recovered from this illness and in July of this year started develop left hip pain.  He said he had had hip pain off and on over the last year but it seemed to be worsening.  Three-phase bone scan was obtained that suggested prosthetic loosening but most likely infection of the left hip an aspiration was performed and it showed a white blood cell count in the joint of over 200,000 and it did grow out MSSA from the cultures of the left hip aspirate.  At this point this is a chronic infection and I recommended removal of all components and placement of a temporary antibiotic spacer.  He will need long-term biotics and PICC line placement and admission following the surgery.  I did talk to him in length in detail about how difficult the surgery ileus will be given he has bone ingrown implants that have been on for 14 years.  He understands that he has a high likelihood of continued infection, bleeding with nerve or vessel injury, unstable hip with dislocation as well as risk of DVT and fracture.  He understands the need to proceed with surgery  given the overwhelming infection he has in his in his left hip.  Procedure description: After informed consent was obtained and appropriate left hip was marked the patient was brought to the operating room and set up on the stretcher where spinal anesthesia was obtained.  He was then laid in the supine position on stretcher and a Foley catheter was placed.  Traction boots were placed on both his feet and he was placed supine on the Hana fracture table with a panel placed in place in both legs and inline skeletal traction devices.  His left operative hip was prepped with DuraPrep and sterile drapes.  Timeout called and he was notified as correct patient correct left hip.  Of note the previous surgery was a posterior approach.  We elected to proceed with an anterior approach to soft tissue plane that has not been violated.  A timeout was called and he was in a fracture.  Patient correct left hip.  We then made incision just inferior and posterior to the anterior superior iliac spine.  This extensively proximally distally.  We dissected down tensor fascia lata muscle tensor fascia was then divided longitudinally to proceed with directed approach to the hip.  Circumflex vessels were identified and cauterized.  I then opened up the joint capsule and found it significant gross purulence.  We performed a capsulectomy and dissected down to the hip joint.  We found significant inflamed synovium and again gross purulence throughout the hip  joint.  Next attention was turned to the femur.  We were able to dislocate the hip and then remove the femoral head/prosthetic hip ball.  We then used osteotomes as best as we could to get around the proximal femur.  This did cause some fractures of the lateral wall and some of the greater trochanter getting this well but ingrown press-fit implant out.  We are eventually able to remove the implant without compromising the medial calcar or the shaft of the femur.  Attention was then turned  to the acetabular component.  We were able to use distraction devices cut around acetabular opponent and remove it without greatly affecting the bone.  Once we get this out we removed more synovium shortly with the sharp excisional debridement of necrotic fascia and synovium.  I then used a reamer to length lightly reamed the acetabular component to remove any biofilm.  We then irrigated 3 L normal saline solution throughout the soft tissue using pulsatile lavage.  We also used a canal scrub device to irrigate the femoral canal.  Next we then mixed cement would gentamicin and vancomycin and we cemented a Prostalac 42 x 32 mm cup into the acetabulum and allow back to harden.  Attention was then turned to the femur.  We did broach with a size 2 broach to make sure we get this in the canal.  We are certainly concerned about the stability of this bridge.  We then opened a somewhat basic press-fit stem size 2 and mixed another batch of cement with vancomycin and gentamicin.  We put cement all around the femoral component and then before it hardened we put it down the femoral canal with hopefully what we felt was appropriate version.  We then eventually went to a size 32+13 metal head ball and reduced this into our semiconstrained cup and held it in place while everything harden completely.  I did not put any of the hardware around the proximal femur given the infection.  We then irrigated again the cyst soft tissue normal saline solution for a total irrigation of 6 L of normal saline solution.  We then irrigated some topical TXA into the wound hemostatic dried we irrigated again with Prontosan irrigant solution.  Once we dried this we placed vancomycin powder into the wound and closed deep tissue with #1 Ethibond suture followed by #1 Vicryl close the tensor fascia.  0 Vicryl's for the deep tissue and 2-0 Vicryl was used to close subcutaneous tissue.  The skin was closed with staples.  A knee immobilizer was placed.  He  was taken off the Hana table and taken recovery in stable condition with all final counts being correct and no complications noted.  Postoperatively we will obtain a H&H in the recovery room and transfuse the patient if needed.  He will need to be just touchdown weightbearing with strict hip precautions.  Of note [name] PA-C did assist in the entire case from beginning to end and his assistance was medically necessary and crucial for helping with soft tissue management and retraction as well as guiding implant placement and a layered closure of the wound.

## 2022-05-20 NOTE — Progress Notes (Signed)
Patient ID: Joshua Cole, male   DOB: June 20, 1961, 61 y.o.   MRN: [id number] The Radiologist mis-read the patient's post-operative pelvis x-ray as a "revision arthroplasty".  This was not a revision, but removal of an infected total hip and placement of a temporary anti-biotic spacer.  This is not a revision hip.  Orders have already been put in for touch-down weight bearing only.  The "fracture" that the Radiologist notes is from removing the bone-ingrown previous femoral component that was infected.

## 2022-05-20 NOTE — Interval H&P Note (Signed)
History and Physical Interval Note: The patient understands that he is here today for removal of all components of his left hip replacement given chronic infection.  He will then be admitted to as an inpatient for PICC line placement and for evaluating him for long-term IV antibiotics.  There has been no acute or interval change in medical status.  The left operative hip was marked and informed consent was obtained.  05/20/2022 11:09 AM  Joshua Cole  has presented today for surgery, with the diagnosis of left total hip infection.  The various methods of treatment have been discussed with the patient and family. After consideration of risks, benefits and other options for treatment, the patient has consented to  Procedure(s): EXCISIONAL LEFT TOTAL HIP ARTHROPLASTY WITH ANTIBIOTIC SPACERS (Left) as a surgical intervention.  The patient's history has been reviewed, patient examined, no change in status, stable for surgery.  I have reviewed the patient's chart and labs.  Questions were answered to the patient's satisfaction.     Mcarthur Rossetti

## 2022-05-20 NOTE — Anesthesia Procedure Notes (Addendum)
Spinal  Patient location during procedure: OR Start time: 05/20/2022 12:22 PM End time: 05/20/2022 12:31 PM Reason for block: surgical anesthesia Staffing Performed: anesthesiologist  Anesthesiologist: Myrtie Soman, MD Performed by: Myrtie Soman, MD Authorized by: Myrtie Soman, MD   Preanesthetic Checklist Completed: patient identified, IV checked, site marked, risks and benefits discussed, surgical consent, monitors and equipment checked, pre-op evaluation and timeout performed Spinal Block Patient position: sitting Prep: Betadine Patient monitoring: heart rate, continuous pulse ox and blood pressure Approach: midline Location: L4-5 Injection technique: single-shot Needle Needle type: Sprotte  Needle gauge: 24 G Needle length: 9 cm Assessment Sensory level: T6 Events: CSF return and second provider Additional Notes

## 2022-05-20 NOTE — Progress Notes (Signed)
Peripherally Inserted Central Catheter Placement  The IV Nurse has discussed with the patient and/or persons authorized to consent for the patient, the purpose of this procedure and the potential benefits and risks involved with this procedure.  The benefits include less needle sticks, lab draws from the catheter, and the patient may be discharged home with the catheter. Risks include, but not limited to, infection, bleeding, blood clot (thrombus formation), and puncture of an artery; nerve damage and irregular heartbeat and possibility to perform a PICC exchange if needed/ordered by physician.  Alternatives to this procedure were also discussed.  Bard Power PICC patient education guide, fact sheet on infection prevention and patient information card has been provided to patient /or left at bedside.    PICC Placement Documentation  PICC Single Lumen 72/82/06 Right Basilic 45 cm 0 cm (Active)  Indication for Insertion or Continuance of Line Home intravenous therapies (PICC only) 05/20/22 1125  Exposed Catheter (cm) 0 cm 05/20/22 1125  Site Assessment Clean, Dry, Intact 05/20/22 1125  Line Status Flushed;Saline locked;Blood return noted 05/20/22 1125  Dressing Type Transparent;Securing device 05/20/22 1125  Dressing Status Antimicrobial disc in place 05/20/22 1125  Safety Lock Not Applicable 01/56/15 3794  Line Care Connections checked and tightened 05/20/22 1125  Line Adjustment (NICU/IV Team Only) No 05/20/22 1125  Dressing Intervention New dressing 05/20/22 1125  Dressing Change Due 05/27/22 05/20/22 [place] 05/20/2022, 11:26 AM

## 2022-05-20 NOTE — Anesthesia Postprocedure Evaluation (Signed)
Anesthesia Post Note  Patient: Joshua Cole  Procedure(s) Performed: EXCISIONAL LEFT TOTAL HIP ARTHROPLASTY WITH ANTIBIOTIC SPACERS (Left: Hip)     Patient location during evaluation: PACU Anesthesia Type: General Level of consciousness: awake and alert Pain management: pain level controlled Vital Signs Assessment: post-procedure vital signs reviewed and stable Respiratory status: spontaneous breathing, nonlabored ventilation, respiratory function stable and patient connected to nasal cannula oxygen Cardiovascular status: blood pressure returned to baseline and stable Postop Assessment: no apparent nausea or vomiting Anesthetic complications: no   No notable events documented.  Last Vitals:  Vitals:   05/20/22 1745 05/20/22 1800  BP: 114/71 126/88  Pulse: 84 90  Resp: 17 17  Temp:    SpO2: 98% 96%    Last Pain:  Vitals:   05/20/22 1715  TempSrc: Oral  PainSc:                  Avo Schlachter,Zeno S

## 2022-05-20 NOTE — Plan of Care (Signed)
  Problem: Education: Goal: Knowledge of General Education information will improve Description Including pain rating scale, medication(s)/side effects and non-pharmacologic comfort measures Outcome: Progressing   Problem: Nutrition: Goal: Adequate nutrition will be maintained Outcome: Progressing   Problem: Pain Managment: Goal: General experience of comfort will improve Outcome: Progressing   

## 2022-05-20 NOTE — Anesthesia Procedure Notes (Deleted)
Spinal  Patient location during procedure: OR End time: 05/20/2022 12:31 PM Reason for block: surgical anesthesia Staffing Performed: anesthesiologist  Anesthesiologist: Myrtie Soman, MD Resident/CRNA: Marijo Conception, CRNA Performed by: Taiyo Kozma, Clinical cytogeneticist D, CRNA Authorized by: Myrtie Soman, MD   Preanesthetic Checklist Completed: patient identified, IV checked, site marked, risks and benefits discussed, surgical consent, monitors and equipment checked, pre-op evaluation and timeout performed Spinal Block Patient position: sitting Prep: DuraPrep Patient monitoring: heart rate, continuous pulse ox and blood pressure Approach: midline Location: L3-4 Injection technique: single-shot Needle Needle type: Pencan  Needle gauge: 24 G Needle length: 9 cm Assessment Sensory level: T6 Events: CSF return

## 2022-05-20 NOTE — Transfer of Care (Signed)
Immediate Anesthesia Transfer of Care Note  Patient: Joshua Cole  Procedure(s) Performed: Procedure(s): EXCISIONAL LEFT TOTAL HIP ARTHROPLASTY WITH ANTIBIOTIC SPACERS (Left)  Patient Location: PACU  Anesthesia Type:Spinal  Level of Consciousness: awake, alert  and oriented  Airway & Oxygen Therapy: Patient Spontanous Breathing  Post-op Assessment: Report given to RN and Post -op Vital signs reviewed and stable  Post vital signs: Reviewed and stable  Last Vitals:  Vitals:   05/20/22 1155  BP: (!) 142/93  Pulse: 90  Resp: 18  Temp: 36.6 C  SpO2: 42%    Complications: No apparent anesthesia complications

## 2022-05-21 DIAGNOSIS — R7881 Bacteremia: Secondary | ICD-10-CM

## 2022-05-21 DIAGNOSIS — M75102 Unspecified rotator cuff tear or rupture of left shoulder, not specified as traumatic: Secondary | ICD-10-CM

## 2022-05-21 DIAGNOSIS — B9561 Methicillin susceptible Staphylococcus aureus infection as the cause of diseases classified elsewhere: Secondary | ICD-10-CM

## 2022-05-21 DIAGNOSIS — T8452XA Infection and inflammatory reaction due to internal left hip prosthesis, initial encounter: Principal | ICD-10-CM

## 2022-05-21 DIAGNOSIS — I079 Rheumatic tricuspid valve disease, unspecified: Secondary | ICD-10-CM

## 2022-05-21 LAB — CBC
HCT: 27.3 % — ABNORMAL LOW (ref 39.0–52.0)
Hemoglobin: 8.8 g/dL — ABNORMAL LOW (ref 13.0–17.0)
MCH: 31.5 pg (ref 26.0–34.0)
MCHC: 32.2 g/dL (ref 30.0–36.0)
MCV: 97.8 fL (ref 80.0–100.0)
Platelets: 130 10*3/uL — ABNORMAL LOW (ref 150–400)
RBC: 2.79 MIL/uL — ABNORMAL LOW (ref 4.22–5.81)
RDW: 13.5 % (ref 11.5–15.5)
WBC: 8.4 10*3/uL (ref 4.0–10.5)
nRBC: 0 % (ref 0.0–0.2)

## 2022-05-21 LAB — BASIC METABOLIC PANEL
Anion gap: 5 (ref 5–15)
BUN: 11 mg/dL (ref 6–20)
CO2: 25 mmol/L (ref 22–32)
Calcium: 8.9 mg/dL (ref 8.9–10.3)
Chloride: 105 mmol/L (ref 98–111)
Creatinine, Ser: 0.59 mg/dL — ABNORMAL LOW (ref 0.61–1.24)
GFR, Estimated: >60 mL/min (ref >60)
Glucose, Bld: 188 mg/dL — ABNORMAL HIGH (ref 70–99)
Potassium: 4.4 mmol/L (ref 3.5–5.1)
Sodium: 135 mmol/L (ref 135–145)

## 2022-05-21 LAB — GLUCOSE, CAPILLARY
Glucose-Capillary: 158 mg/dL — ABNORMAL HIGH (ref 70–99)
Glucose-Capillary: 181 mg/dL — ABNORMAL HIGH (ref 70–99)
Glucose-Capillary: 167 mg/dL — ABNORMAL HIGH (ref 70–99)
Glucose-Capillary: 147 mg/dL — ABNORMAL HIGH (ref 70–99)

## 2022-05-21 NOTE — Progress Notes (Signed)
Physical Therapy Treatment Patient Details Name: Joshua Cole MRN: [id number] DOB: July 26, 1961 Today's Date: 05/21/2022   History of Present Illness 61 yo male admitted with L prosthetic hip joint infection. S/P removal of hardware + spacer 05/20/22. 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    PT Comments    2nd session to assist pt back into bed. Continued to educate pt on precautions and mobility techniques. He continues to have difficulty positioning and advancing L LE. Will continue to follow pt during this hospital stay.    Recommendations for follow up therapy are one component of a multi-disciplinary discharge planning process, led by the attending physician.  Recommendations may be updated based on patient status, additional functional criteria and insurance authorization.  Follow Up Recommendations  Follow physician's recommendations for discharge plan and follow up therapies     Assistance Recommended at Discharge Frequent or constant Supervision/Assistance  Patient can return home with the following Two people to help with walking and/or transfers;Two people to help with bathing/dressing/bathroom;Help with stairs or ramp for entrance;Assistance with cooking/housework;Assist for transportation   Financial risk analyst ;Wheelchair cushion; BSC/3in1 (L elevating leg rest)    Recommendations for Other Services OT consult     Precautions / Restrictions Precautions Precautions: Anterior Hip Precaution Booklet Issued: Yes (comment) Precaution Comments: strict anterior hip precautions + no hip/knee ROM exercises for now Required Braces or Orthoses: Knee Immobilizer - Left Knee Immobilizer - Left: On when out of bed or walking Restrictions Weight Bearing Restrictions: Yes LLE Weight Bearing: Touchdown weight bearing     Mobility  Bed Mobility Overal bed mobility: Needs Assistance Bed Mobility: Sit to  Supine     Supine to sit: Mod assist, +2 for physical assistance, +2 for safety/equipment, HOB elevated Sit to supine: Mod assist, +2 for physical assistance, +2 for safety/equipment, HOB elevated   General bed mobility comments: Assist for trunk and L LE. Utilized bedpad to assist with scooting, positioning at EOB. Increased time. Cues provided. Educated on precautions.    Transfers Overall transfer level: Needs assistance Equipment used: Rolling walker (2 wheels) Transfers: Sit to/from Stand Sit to Stand: Min assist, +2 physical assistance, +2 safety/equipment, From elevated surface           General transfer comment: Assist to power up from recliner. Cues for safety, technique, hand/LE placement. Increased time.    Ambulation/Gait Ambulation/Gait assistance: Min assist, +2 physical assistance, +2 safety/equipment Gait Distance (Feet): 3 Feet Assistive device: Rolling walker (2 wheels) Gait Pattern/deviations: Step-to pattern       General Gait Details: Cues for safety, technique, adherence to TDWB status. Pt required assistance to position/advance L LE. He has some difficulty maintaining TDWB consistently. Will possibly have to limit ambulation distances.   Stairs             Wheelchair Mobility    Modified Rankin (Stroke Patients Only)       Balance Overall balance assessment: Needs assistance Sitting-balance support: Bilateral upper extremity supported, Feet supported Sitting balance-Leahy Scale: Good     Standing balance support: During functional activity, Bilateral upper extremity supported, Reliant on assistive device for balance Standing balance-Leahy Scale: Poor                              Cognition Arousal/Alertness: Awake/alert Behavior During Therapy: WFL for tasks assessed/performed Overall Cognitive Status: Within Functional Limits for tasks assessed  Exercises       General Comments        Pertinent Vitals/Pain Pain Assessment Pain Assessment: 0-10 Pain Score: 6  Pain Location: L hip when mobilizing Pain Descriptors / Indicators: Sharp, Operative site guarding, Grimacing Pain Intervention(s): Limited activity within patient's tolerance, Monitored during session, Repositioned    Home Living                          Prior Function            PT Goals (current goals can now be found in the care plan section) Acute Rehab PT Goals Patient Stated Goal: less pain. regain PLOF/independence PT Goal Formulation: With patient Time For Goal Achievement: 06/04/22 Potential to Achieve Goals: Good Progress towards PT goals: Progressing toward goals    Frequency    7X/week      PT Plan Current plan remains appropriate    Co-evaluation              AM-PAC PT "6 Clicks" Mobility   Outcome Measure  Help needed turning from your back to your side while in a flat bed without using bedrails?: A Lot Help needed moving from lying on your back to sitting on the side of a flat bed without using bedrails?: A Lot Help needed moving to and from a bed to a chair (including a wheelchair)?: A Lot Help needed standing up from a chair using your arms (e.g., wheelchair or bedside chair)?: A Lot Help needed to walk in hospital room?: A Lot Help needed climbing 3-5 steps with a railing? : Total 6 Click Score: 11    End of Session Equipment Utilized During Treatment: Gait belt;Left knee immobilizer Activity Tolerance: Patient limited by pain;Patient limited by fatigue Patient left: in bed;with call bell/phone within reach;with bed alarm set   PT Visit Diagnosis: Pain;Difficulty in walking, not elsewhere classified (R26.2);Other abnormalities of gait and mobility (R26.89) Pain - Right/Left: Left Pain - part of body: Hip     Time: 1315-1330 PT Time Calculation (min) (ACUTE ONLY): 15 min  Charges:  $Gait Training: 8-22 mins                         Doreatha Massed, PT Acute Rehabilitation  Office: [phone] Pager: [phone]

## 2022-05-21 NOTE — Progress Notes (Signed)
PHARMACY CONSULT NOTE FOR:  OUTPATIENT  PARENTERAL ANTIBIOTIC THERAPY (OPAT)  Indication: septic arthritis Regimen: cefazolin 2g IV q8h End date: 06/30/2022  IV antibiotic discharge orders are pended. To discharging provider:  please sign these orders via discharge navigator,  Select New Orders & click on the button choice - Manage This Unsigned Work.     Thank you for allowing pharmacy to be a part of this patient's care.  Peggyann Juba, PharmD, BCPS Pharmacy: [phone] 05/21/2022, 3:29 PM

## 2022-05-21 NOTE — Evaluation (Signed)
Physical Therapy Evaluation Patient Details Name: Joshua Cole MRN: [id number] DOB: 08-Jan-1961 Today's Date: 05/21/2022  History of Present Illness  61 yo male admitted with L prosthetic hip joint infection. S/P removal of hardware + spacer 05/20/22. 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  Clinical Impression  On eval, pt required Mod A +2 for mobility. He was able to walk ~5 feet with a RW but not without significant difficulty. Educated pt on anterior precautions and TDWB status. Spoke with Dr [name] who prefers we exercise caution and adhere closely to precautions at this time.  Will continue to follow and progress activity as safely able.      Recommendations for follow up therapy are one component of a multi-disciplinary discharge planning process, led by the attending physician.  Recommendations may be updated based on patient status, additional functional criteria and insurance authorization.  Follow Up Recommendations Follow physician's recommendations for discharge plan and follow up therapies      Assistance Recommended at Discharge Frequent or constant Supervision/Assistance  Patient can return home with the following  Two people to help with walking and/or transfers;Two people to help with bathing/dressing/bathroom;Help with stairs or ramp for entrance;Assistance with cooking/housework;Assist for transportation    Dietitian;Wheelchair cushion;BSC/3in1 (L Agricultural engineer)  Recommendations for Other Services  OT consult    Functional Status Assessment Patient has had a recent decline in their functional status and demonstrates the ability to make significant improvements in function in a reasonable and predictable amount of time.     Precautions / Restrictions Precautions Precautions: Anterior Hip Precaution Booklet Issued: Yes (comment) Precaution Comments: strict anterior hip  precautions + no hip/knee ROM exercises for now Required Braces or Orthoses: Knee Immobilizer - Left Knee Immobilizer - Left: On when out of bed or walking (can loosen straps when at rest) Restrictions Weight Bearing Restrictions: Yes LLE Weight Bearing: Touchdown weight bearing      Mobility  Bed Mobility Overal bed mobility: Needs Assistance Bed Mobility: Supine to Sit     Supine to sit: Mod assist, +2 for physical assistance, +2 for safety/equipment, HOB elevated     General bed mobility comments: Assist for trunk and L LE. Utilized bedpad to assist with scooting, positioning at EOB. Increased time. Cues provided. Educated on precautions.    Transfers Overall transfer level: Needs assistance Equipment used: Rolling walker (2 wheels) Transfers: Sit to/from Stand Sit to Stand: Min assist, +2 physical assistance, +2 safety/equipment, From elevated surface           General transfer comment: Assist to power up from elevated bed surface. Cues for safety, technique, hand/LE placement. Increased time.    Ambulation/Gait Ambulation/Gait assistance: Min assist, +2 physical assistance, +2 safety/equipment Gait Distance (Feet): 5 Feet Assistive device: Rolling walker (2 wheels) Gait Pattern/deviations: Step-to pattern       General Gait Details: Cues for safety, technique, adherence to TDWB status. Pt required assistance to position/advance L LE. He has some difficulty maintaining TDWB consistently. Will possibly have to limit ambulation distances.  Stairs            Wheelchair Mobility    Modified Rankin (Stroke Patients Only)       Balance Overall balance assessment: Needs assistance Sitting-balance support: Bilateral upper extremity supported, Feet supported Sitting balance-Leahy Scale: Good     Standing balance support: During functional activity, Bilateral upper extremity supported, Reliant on assistive device for balance Standing balance-Leahy Scale:  Poor  Pertinent Vitals/Pain Pain Assessment Pain Assessment: 0-10 Pain Score: 9  Pain Location: L hip when mobilizing (5/10 at rest) Pain Descriptors / Indicators: Sharp, Operative site guarding, Grimacing Pain Intervention(s): Limited activity within patient's tolerance, Repositioned, Monitored during session    [place] expects to be discharged to:: Private residence Living Arrangements: Spouse/significant other Available Help at Discharge: Family;Available PRN/intermittently Type of Home: House Home Access: Stairs to enter Entrance Stairs-Rails: Psychiatric nurse of Steps: 3   Home Layout: One level Home Equipment: Conservation officer, nature (2 wheels);Cane - single point      Prior Function Prior Level of Function : Independent/Modified Independent             Mobility Comments: using cane       Hand Dominance        Extremity/Trunk Assessment   Upper Extremity Assessment Upper Extremity Assessment: Defer to OT evaluation    Lower Extremity Assessment Lower Extremity Assessment: LLE deficits/detail LLE Deficits / Details: weak and painful LLE: Unable to fully assess due to immobilization LLE Sensation: history of peripheral neuropathy LLE Coordination: decreased gross motor;decreased fine motor    Cervical / Trunk Assessment Cervical / Trunk Assessment: Normal  Communication   Communication: No difficulties  Cognition Arousal/Alertness: Awake/alert Behavior During Therapy: WFL for tasks assessed/performed Overall Cognitive Status: Within Functional Limits for tasks assessed                                          General Comments      Exercises     Assessment/Plan    PT Assessment Patient needs continued PT services  PT Problem List Decreased strength;Decreased mobility;Decreased range of motion;Decreased activity tolerance;Decreased balance;Decreased knowledge  of use of DME;Pain       PT Treatment Interventions DME instruction;Gait training;Therapeutic activities;Therapeutic exercise;Patient/family education;Functional mobility training;Stair training;Balance training    PT Goals (Current goals can be found in the Care Plan section)  Acute Rehab PT Goals Patient Stated Goal: less pain. regain PLOF/independence PT Goal Formulation: With patient Time For Goal Achievement: 06/04/22 Potential to Achieve Goals: Good    Frequency 7X/week     Co-evaluation               AM-PAC PT "6 Clicks" Mobility  Outcome Measure Help needed turning from your back to your side while in a flat bed without using bedrails?: A Lot Help needed moving from lying on your back to sitting on the side of a flat bed without using bedrails?: A Lot Help needed moving to and from a bed to a chair (including a wheelchair)?: A Lot Help needed standing up from a chair using your arms (e.g., wheelchair or bedside chair)?: A Lot Help needed to walk in hospital room?: A Lot Help needed climbing 3-5 steps with a railing? : Total 6 Click Score: 11    End of Session Equipment Utilized During Treatment: Gait belt;Left knee immobilizer Activity Tolerance: Patient limited by pain;Patient limited by fatigue Patient left: in chair;with call bell/phone within reach   PT Visit Diagnosis: Pain;Difficulty in walking, not elsewhere classified (R26.2);Other abnormalities of gait and mobility (R26.89) Pain - Right/Left: Left Pain - part of body: Hip    Time: 8527-7824 PT Time Calculation (min) (ACUTE ONLY): 29 min   Charges:   PT Evaluation $PT Eval Moderate Complexity: 1 Mod PT Treatments $Gait Training: 8-22 mins  [facility]  Office: [phone] Pager: [phone]

## 2022-05-21 NOTE — Consult Note (Signed)
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  Principal Problem:   Left hip prosthetic joint infection (HCC)   Scheduled Meds:  aspirin  81 mg Oral BID   Chlorhexidine Gluconate Cloth  6 each Topical Daily   docusate sodium  100 mg Oral BID   gabapentin  300 mg Oral BID   insulin aspart  0-5 Units Subcutaneous QHS   insulin aspart  0-9 Units Subcutaneous TID WC   insulin glargine-yfgn  15 Units Subcutaneous QHS   metFORMIN  500 mg Oral BID WC   pantoprazole  40 mg Oral Daily   sodium chloride flush  10-40 mL Intracatheter Q12H   Continuous Infusions:  sodium chloride 10 mL/hr at 05/21/22 0800    ceFAZolin (ANCEF) IV 2 g (05/21/22 1307)   methocarbamol (ROBAXIN) IV     PRN Meds:.acetaminophen, albuterol, alum & mag hydroxide-simeth, HYDROmorphone (DILAUDID) injection, HYDROmorphone, menthol-cetylpyridinium **OR** phenol, methocarbamol **OR** methocarbamol (ROBAXIN) IV, metoCLOPramide **OR** metoCLOPramide (REGLAN) injection, ondansetron **OR** ondansetron (ZOFRAN) IV, mouth rinse, oxyCODONE, polyvinyl alcohol, sodium chloride flush  HPI: Joshua Cole is a 61 y.o. male who I saw last year after he was discharged from the hospital having been diagnosed with MSSA bacteremia and tricuspid valve endocarditis.  He had been considered for valve replacement but was felt not to be a good operative candidate.  Fortunately his blood cultures cleared and he completed a course of IV cefazolin.  He was having multiple joint pains which at the time were being attributed to gout.  He  had a shoulder pain in particular and had an MRI of the shoulder that showed a full-thickness tear of the supraspinatus tendon and high-grade articular tearing of the anterior fibers supraspinatus tendon is this mild subscapularis tendinosis with distention of the subacromial subdeltoid bursa with peripheral enhancement of the area that could be blood versus potential infection.  Time radiology felt this more likely was due to blood.  He ultimately has had shoulder surgery.  He also in the intermittent developed pain at his left hip where he has a prosthetic hip.  This worsened and ultimately he underwent aspirate of the area which showed over 20,000 white blood cells and which had a culture that yielded methicillin sensitive Staphylococcus aureus.  He is been followed closely by Dr. [name] and ultimately back to the patient to be brought to the hospital and have resection arthroplasty which was performed on the eighth 2023.  He has had placement of a temporary antibiotic spacer into the left hip.  PICC line is in place and he is appropriately on cefazolin.  We will plan on 6 weeks of postoperative cefazolin and have scheduled him for follow-up in clinic with me.  I spent 84 minutes with the patient including than 50% of the time in face to face counseling of the patient writing his prosthetic hip infection prior MSSA bacteremia tricuspid valve endocarditis personally reviewing aspirate cell count differential cultures plain films along with review of medical records in preparation for the visit and during the visit and in coordination of nis  care.   Diagnosis: Septic hip infection Culture Result: MSSA  No Known Allergies  OPAT Orders Discharge antibiotics to be given via PICC line Discharge antibiotics: Aveline 2 g IV every 8 hours Duration: 6 Weeks End Date: June 30, 2022   [place] Per Protocol:  Home health RN for IV administration and teaching; PICC line care and labs.     Labs weekly while on IV antibiotics: _x_ CBC with differential _x_ BMP  _x_ CRP _x_ ESR   _x_ Please pull PIC at completion of IV antibiotics __ Please leave PIC in place until doctor has seen patient or been notified  Fax weekly labs to [phone]  Clinic Follow Up Appt:   Avyaan Summer has an appointment on 06/20/2022 at 415 PM with Dr. Tommy Medal at :  [facility], which  is located in the [facility] at  [address] in [place].  [address], which is located to the left of the elevators.  Phone: [phone]  Fax: [phone]  [email or web address]  The patient should arrive 30 minutes prior to their appoitment.  I will sign off for now  Please call with further questions.  Review of Systems: Review of Systems  Constitutional:  Negative for chills, fever, malaise/fatigue and weight loss.  HENT:  Negative for congestion and sore throat.   Eyes:  Negative for blurred vision and photophobia.  Respiratory:  Negative for cough, shortness of breath and wheezing.   Cardiovascular:  Negative for chest pain, palpitations and leg swelling.  Gastrointestinal:  Negative for abdominal pain, blood in stool, constipation, diarrhea, heartburn, melena, nausea and vomiting.  Genitourinary:  Negative for dysuria, flank pain and hematuria.  Musculoskeletal:  Positive for back pain and joint pain. Negative for falls and myalgias.  Skin:  Negative for itching and rash.  Neurological:  Negative for dizziness, focal weakness, loss of consciousness, weakness and headaches.  Endo/Heme/Allergies:  Does not bruise/bleed easily.  Psychiatric/Behavioral:  Negative for depression and suicidal ideas. The patient does not have insomnia.     Past Medical History:  Diagnosis Date   Arthritis    Asthma    Chronic kidney disease    Diabetes mellitus without complication (HCC)    type 2   DJD (degenerative joint  disease)    Dysrhythmia    junctional tachycardia and incomplete heart block   Gout    Heart block    Lung nodules    a. 09/2021 CT chest: Interval decrease in size and number of bilateral lung nodules, likely consistent with sequelae associated with an infectious/inflammatory process.   MSSA bacteremia 06/2021   Rotator cuff tear 08/26/2021   Subacute bacterial endocarditis (SBE)    a. 06/2021 TEE: mobile mass attached to the tricuspid valve-->Abx rx-->09/2021 TEE: EF 55-60%, no rwma, nl RV fxn, mild-mod RAE, mild MR, mobile echodense 11x53m mass in the TV apparatus-->conservative rx per TCTS.    Social History   Tobacco Use   Smoking status: Former    Packs/day: 0.25    Years: 15.00    Total pack years: 3.75    Types: Cigarettes    Quit date: 2007    Years since quitting: 16.6   Smokeless tobacco: Never  Vaping Use   Vaping Use: Never used  Substance Use Topics   Alcohol use: Not Currently    Alcohol/week: 6.0 standard drinks of alcohol    Types: 6 Glasses of wine per week    Comment: couple  of glasses of wine on the weekends   Drug use: Not Currently    Family History  Problem Relation Age of Onset   Other Mother        unknown medical history   Lung cancer Father    Psoriasis Sister    Breast cancer Sister    Hypertension Brother    Hyperlipidemia Brother    Diabetes Brother    Heart attack Brother 56   No Known Allergies  OBJECTIVE: Blood pressure 130/71, pulse 98, temperature 98.2 F (36.8 C), temperature source Oral, resp. rate 17, height '5\' 10"'  (1.778 m), weight 103 kg, SpO2 95 %.  Physical Exam Constitutional:      Appearance: He is well-developed.  HENT:     Head: Normocephalic and atraumatic.  Eyes:     Conjunctiva/sclera: Conjunctivae normal.  Cardiovascular:     Rate and Rhythm: Normal rate and regular rhythm.  Pulmonary:     Effort: Pulmonary effort is normal. No respiratory distress.     Breath sounds: No wheezing.  Abdominal:     General:  There is no distension.     Palpations: Abdomen is soft.  Musculoskeletal:     Cervical back: Normal range of motion and neck supple.  Skin:    General: Skin is warm and dry.     Coloration: Skin is not pale.     Findings: No erythema or rash.  Neurological:     General: No focal deficit present.     Mental Status: He is alert and oriented to person, place, and time.  Psychiatric:        Mood and Affect: Mood normal.        Behavior: Behavior normal.        Thought Content: Thought content normal.        Judgment: Judgment normal.     PICC line is clean  Hip dressed  Lab Results Lab Results  Component Value Date   WBC 8.4 05/21/2022   HGB 8.8 (L) 05/21/2022   HCT 27.3 (L) 05/21/2022   MCV 97.8 05/21/2022   PLT 130 (L) 05/21/2022    Lab Results  Component Value Date   CREATININE 0.59 (L) 05/21/2022   BUN 11 05/21/2022   NA 135 05/21/2022   K 4.4 05/21/2022   CL 105 05/21/2022   CO2 25 05/21/2022    Lab Results  Component Value Date   ALT 2 09/20/2021   AST 13 09/20/2021   ALKPHOS 90 09/20/2021   BILITOT 0.5 09/20/2021     Microbiology: Recent Results (from the past 240 hour(s))  Surgical pcr screen     Status: Abnormal   Collection Time: 05/18/22  1:37 PM   Specimen: Nasal Mucosa; Nasal Swab  Result Value Ref Range Status   MRSA, PCR NEGATIVE NEGATIVE Final   Staphylococcus aureus POSITIVE (A) NEGATIVE Final    Comment: (NOTE) The Xpert SA Assay (FDA approved for NASAL specimens in patients 27 years of age and older), is one component of a comprehensive surveillance program. It is not intended to diagnose infection nor to guide or monitor treatment. Performed at [facility], [place] [address] [place], [place] [address]     Alcide Evener, [place] for Infectious [facility] [phone] pager  05/21/2022, 3:36 PM

## 2022-05-21 NOTE — Progress Notes (Signed)
Patient ID: Joshua Cole, male   DOB: 01/16/1961, 61 y.o.   MRN: [id number] Today is postop day 1 status post an extensive left hip surgery for the patient.  We had to excise all left total hip arthroplasty components.  There was gross purulence encountered.  A recent aspiration of the left hip did show over 200,000 white blood cells and it did grow out MSSA.  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  His hips have been in for over 14 years.  He understands the surgery was quite extensive and needed having significant acute blood loss anemia from surgery.  His hemoglobin this morning is 8.8 and his vital signs are stable.  He is in a bedside chair and appears comfortable.  I did speak with physical therapy.  He needs to limit his mobility and only touchdown weightbearing until further notice.  He needs to remain in his knee immobilizer but can come out of it for resting and take pressure off the back of his leg but he does not need to abduct his left hip or significantly flex the hip either.  He does have a PICC line in place.  He is currently on Ancef which is appropriate.  We will order a new CBC for tomorrow as well as a sed rate and CRP for baseline.  I will consult infectious disease service on Monday in terms of antibiotic type and duration.  I did share with him his x-rays and let him know the treatment plan for infected total joint in terms of the potential for a replantation but much later down the road.  All questions and concerns were answered and addressed.

## 2022-05-22 LAB — GLUCOSE, CAPILLARY
Glucose-Capillary: 127 mg/dL — ABNORMAL HIGH (ref 70–99)
Glucose-Capillary: 156 mg/dL — ABNORMAL HIGH (ref 70–99)
Glucose-Capillary: 150 mg/dL — ABNORMAL HIGH (ref 70–99)
Glucose-Capillary: 195 mg/dL — ABNORMAL HIGH (ref 70–99)

## 2022-05-22 LAB — CBC
HCT: 26.4 % — ABNORMAL LOW (ref 39.0–52.0)
Hemoglobin: 8.5 g/dL — ABNORMAL LOW (ref 13.0–17.0)
MCH: 31.7 pg (ref 26.0–34.0)
MCHC: 32.2 g/dL (ref 30.0–36.0)
MCV: 98.5 fL (ref 80.0–100.0)
Platelets: 132 10*3/uL — ABNORMAL LOW (ref 150–400)
RBC: 2.68 MIL/uL — ABNORMAL LOW (ref 4.22–5.81)
RDW: 14.2 % (ref 11.5–15.5)
WBC: 9.6 10*3/uL (ref 4.0–10.5)
nRBC: 0 % (ref 0.0–0.2)

## 2022-05-22 LAB — SEDIMENTATION RATE: Sed Rate: 70 mm/hr — ABNORMAL HIGH (ref 0–16)

## 2022-05-22 LAB — C-REACTIVE PROTEIN: CRP: 4.1 mg/dL — ABNORMAL HIGH (ref <1.0)

## 2022-05-22 MED ORDER — CEFAZOLIN SODIUM-DEXTROSE 2-4 GM/100ML-% IV SOLN
2.0000 g | Freq: Three times a day (TID) | INTRAVENOUS | Status: DC
  Administered 2022-05-22 – 2022-05-27 (×16): 2 g via INTRAVENOUS
  Filled 2022-05-22 (×16): qty 100

## 2022-05-22 NOTE — Plan of Care (Signed)
Plan of care reviewed and discussed with the patient. 

## 2022-05-22 NOTE — Progress Notes (Addendum)
Physical Therapy Treatment Patient Details Name: Joshua Cole MRN: [id number] DOB: June 11, 1961 Today's Date: 05/22/2022   History of Present Illness 61 yo male admitted with L prosthetic hip joint infection. S/P removal of hardware + spacer 05/20/22. 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    PT Comments    Pt agreeable to working with therapy again. Worked on gait training-very short distance. He continues to have difficulty advancing L LE-requires assist from therapist to move leg each time. He tolerated activity a little better this afternoon compared to this morning. Moderate pain with activity. Assisted pt back to bed at end of session. Pt will need 24/7 supervision/assist at home. He is not yet ready to d/c home. D/C plan will be challenging. Could benefit from [facility] rehab but unsure if he can be placed. Recommend TOC consult.    Recommendations for follow up therapy are one component of a multi-disciplinary discharge planning process, led by the attending physician.  Recommendations may be updated based on patient status, additional functional criteria and insurance authorization.  Follow Up Recommendations  Follow physician's recommendations for discharge plan and follow up therapies     Assistance Recommended at Discharge Frequent or constant Supervision/Assistance  Patient can return home with the following Two people to help with walking and/or transfers;Two people to help with bathing/dressing/bathroom;Help with stairs or ramp for entrance;Assistance with cooking/housework;Assist for transportation   Financial risk analyst ;Wheelchair cushion; BSC/3in1 (L elevating legrest).  Will also need ambulance transport home.    Recommendations for Other Services OT consult     Precautions / Restrictions Precautions Precautions: Anterior Hip Precaution Booklet Issued: Yes (comment) Precaution Comments: strict  anterior hip precautions + no hip/knee ROM exercises for now. Pt able to recall precautions/restrictions Required Braces or Orthoses: Knee Immobilizer - Left Knee Immobilizer - Left: On when out of bed or walking Restrictions Weight Bearing Restrictions: Yes LLE Weight Bearing: Touchdown weight bearing     Mobility  Bed Mobility Overal bed mobility: Needs Assistance Bed Mobility: Sit to Supine       Sit to supine: Mod assist, +2 for physical assistance, +2 for safety/equipment, HOB elevated   General bed mobility comments: Assist for trunk and L LE. Utilized bedpad to assist with scooting, positioning at EOB. Increased time. Cues provided. Educated on precautions.    Transfers Overall transfer level: Needs assistance Equipment used: Rolling walker (2 wheels) Transfers: Sit to/from Stand Sit to Stand: Min assist, +2 physical assistance, +2 safety/equipment, From elevated surface           General transfer comment: Assist to power up from recliner. Cues for safety, technique, hand/LE placement. Increased time.    Ambulation/Gait Min Assist; +2 safety/equipment  Gait Distance (Feet): 4 Feet Assistive device: Rolling walker (2 wheels) Gait Pattern/deviations: Step-to pattern       General Gait Details: Cues for safety, technique, adherence to TDWB status. Pt required assistance to position/advance L LE. He has some difficulty maintaining TDWB consistently. Will likely have to limit ambulation distances.   Stairs             Wheelchair Mobility    Modified Rankin (Stroke Patients Only)       Balance Overall balance assessment: Needs assistance         Standing balance support: During functional activity, Reliant on assistive device for balance, Bilateral upper extremity supported Standing balance-Leahy Scale: Poor  Cognition Arousal/Alertness: Awake/alert Behavior During Therapy: WFL for tasks  assessed/performed Overall Cognitive Status: Within Functional Limits for tasks assessed                                          Exercises General Exercises - Lower Extremity Ankle Circles/Pumps: AROM, Left, 5 reps, Seated    General Comments        Pertinent Vitals/Pain Pain Assessment Pain Assessment: 0-10 Pain Score: 8  Pain Location: L hip when mobilizing Pain Descriptors / Indicators: Sharp, Operative site guarding, Grimacing, Guarding Pain Intervention(s): Limited activity within patient's tolerance, Monitored during session, Repositioned    Home Living Family/patient expects to be discharged to:: Private residence Living Arrangements: Spouse/significant other Available Help at Discharge: Family;Available PRN/intermittently Type of Home: House Home Access: Stairs to enter Entrance Stairs-Rails: Psychiatric nurse of Steps: 3   Home Layout: One level Home Equipment: Conservation officer, nature (2 wheels);Cane - single point;Adaptive equipment      Prior Function            PT Goals (current goals can now be found in the care plan section) Progress towards PT goals: Progressing toward goals    Frequency    7X/week      PT Plan Current plan remains appropriate    Co-evaluation              AM-PAC PT "6 Clicks" Mobility   Outcome Measure  Help needed turning from your back to your side while in a flat bed without using bedrails?: A Lot Help needed moving from lying on your back to sitting on the side of a flat bed without using bedrails?: A Lot Help needed moving to and from a bed to a chair (including a wheelchair)?: A Lot Help needed standing up from a chair using your arms (e.g., wheelchair or bedside chair)?: A Lot Help needed to walk in hospital room?: A Lot Help needed climbing 3-5 steps with a railing? : Total 6 Click Score: 11    End of Session Equipment Utilized During Treatment: Gait belt;Left knee  immobilizer Activity Tolerance: Patient limited by pain;Patient limited by fatigue Patient left: in bed;with call bell/phone within reach;with bed alarm set;with family/visitor present   PT Visit Diagnosis: Pain;Difficulty in walking, not elsewhere classified (R26.2);Other abnormalities of gait and mobility (R26.89) Pain - Right/Left: Left Pain - part of body: Hip     Time:1335-1350 PT Time Calculation (min) (ACUTE ONLY): 13 min  Charges:  $Gait Training: 8-22 mins                         Doreatha Massed, PT Acute Rehabilitation  Office: [phone] Pager: [phone]

## 2022-05-22 NOTE — Progress Notes (Signed)
Physical Therapy Treatment Patient Details Name: Joshua Cole MRN: [id number] DOB: [birth year] Today's Date: 05/22/2022   History of Present Illness 61 yo male admitted with L prosthetic hip joint infection. S/P removal of hardware + spacer 05/20/22. 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    PT Comments    Pt agreeable to working with PT. He reports getting to recliner this a.m with OT was rough. Attempted to work on gait training however progress was limited by significant pain and dizziness. Encouraged pt to try to continue to sit up in the recliner. Will return later for a 2nd session.     Recommendations for follow up therapy are one component of a multi-disciplinary discharge planning process, led by the attending physician.  Recommendations may be updated based on patient status, additional functional criteria and insurance authorization.  Follow Up Recommendations  Follow physician's recommendations for discharge plan and follow up therapies     Assistance Recommended at Discharge Frequent or constant Supervision/Assistance  Patient can return home with the following Two people to help with walking and/or transfers;Two people to help with bathing/dressing/bathroom;Help with stairs or ramp for entrance;Assistance with cooking/housework;Assist for transportation   Equipment Recommendations  Wheelchair (measurements PT);Wheelchair cushion (measurements PT);BSC/3in1 (L elevating leg rest)    Recommendations for Other Services OT consult     Precautions / Restrictions Precautions Precautions: Anterior Hip Precaution Booklet Issued: Yes (comment) Precaution Comments: strict anterior hip precautions + no hip/knee ROM exercises for now. Pt able to recall precautions/restrictions Required Braces or Orthoses: Knee Immobilizer - Left Knee Immobilizer - Left: On when out of bed or walking Restrictions Weight Bearing Restrictions:  Yes LLE Weight Bearing: Touchdown weight bearing     Mobility  Bed Mobility Overal bed mobility: Needs Assistance Bed Mobility: Sit to Supine       Sit to supine: Mod assist, +2 for physical assistance, +2 for safety/equipment, HOB elevated   General bed mobility comments: oob in recliner    Transfers Overall transfer level: Needs assistance Equipment used: Rolling walker (2 wheels) Transfers: Sit to/from Stand Sit to Stand: Min assist, +2 physical assistance, +2 safety/equipment, From elevated surface           General transfer comment: Assist to power up from recliner. Cues for safety, technique, hand/LE placement. Increased time.    Ambulation/Gait Ambulation/Gait assistance: Min assist, +2 physical assistance, +2 safety/equipment Gait Distance (Feet): 2 Feet Assistive device: Rolling walker (2 wheels) Gait Pattern/deviations: Step-to pattern       General Gait Details: Cues for safety, technique, adherence to TDWB status. Pt required assistance to position/advance L LE. He has some difficulty maintaining TDWB consistently. Pt only able to tolerate taking a few steps 2* pain and dizziness. Followed closely with recliner.   Stairs             Wheelchair Mobility    Modified Rankin (Stroke Patients Only)       Balance Overall balance assessment: Needs assistance         Standing balance support: During functional activity, Reliant on assistive device for balance, Bilateral upper extremity supported Standing balance-Leahy Scale: Poor                              Cognition Arousal/Alertness: Awake/alert Behavior During Therapy: WFL for tasks assessed/performed Overall Cognitive Status: Within Functional Limits for tasks assessed  Exercises General Exercises - Lower Extremity Ankle Circles/Pumps: AROM, Left, 5 reps, Seated    General Comments        Pertinent Vitals/Pain  Pain Assessment Pain Assessment: 0-10 Pain Score: 9  Pain Location: L hip when mobilizing Pain Descriptors / Indicators: Sharp, Operative site guarding, Grimacing, Guarding Pain Intervention(s): Limited activity within patient's tolerance, Monitored during session, Repositioned    Home Living Family/patient expects to be discharged to:: Private residence Living Arrangements: Spouse/significant other Available Help at Discharge: Family;Available PRN/intermittently Type of Home: House Home Access: Stairs to enter Entrance Stairs-Rails: Psychiatric nurse of Steps: 3   Home Layout: One level Home Equipment: Conservation officer, nature (2 wheels);Cane - single point;Adaptive equipment      Prior Function            PT Goals (current goals can now be found in the care plan section) Progress towards PT goals: Progressing toward goals    Frequency    7X/week      PT Plan Current plan remains appropriate    Co-evaluation              AM-PAC PT "6 Clicks" Mobility   Outcome Measure  Help needed turning from your back to your side while in a flat bed without using bedrails?: A Lot Help needed moving from lying on your back to sitting on the side of a flat bed without using bedrails?: A Lot Help needed moving to and from a bed to a chair (including a wheelchair)?: A Lot Help needed standing up from a chair using your arms (e.g., wheelchair or bedside chair)?: A Lot Help needed to walk in hospital room?: A Lot Help needed climbing 3-5 steps with a railing? : Total 6 Click Score: 11    End of Session Equipment Utilized During Treatment: Gait belt;Left knee immobilizer Activity Tolerance: Patient limited by pain;Patient limited by fatigue Patient left: in chair;with call bell/phone within reach   PT Visit Diagnosis: Pain;Difficulty in walking, not elsewhere classified (R26.2);Other abnormalities of gait and mobility (R26.89) Pain - Right/Left: Left Pain - part of  body: Hip     Time: 9629-5284 PT Time Calculation (min) (ACUTE ONLY): 13 min  Charges:  $Gait Training: 8-22 mins                         Doreatha Massed, PT Acute Rehabilitation  Office: [phone] Pager: [phone]

## 2022-05-22 NOTE — Evaluation (Signed)
Occupational Therapy Evaluation Patient Details Name: Joshua Cole MRN: [id number] DOB: 1961-04-17 Today's Date: 05/22/2022   History of Present Illness 61 yo male admitted with L prosthetic hip joint infection. S/P removal of hardware + spacer 05/20/22. 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   Clinical Impression   [name] is a 61 year old man s/p hip fracture repair with decreased ROM and strength of RLE, impaired balance, decreased activity tolerance and intermittent complaints of pain resulting in a sudden decline in functional mobility and ability to perform independent ADLs. He is also limited by the need for knee immobilizer and anterior hip precautions. On evaluation he was min assist to transfer to edge of bed needing bed rail and min guard to stand. However, he was unable to use his upper body strength effectively on walker to take steps and also limited by pain. He was able to take one step but otherwise needed to slide his right foot to transfer into chair. He needed significant assistance for ADLs. Patient will benefit from skilled OT services while in hospital to improve deficits and learn compensatory strategies as needed in order to return to PLOF.  Patient's discharge disposition difficult due to no insurance. Will need to be more functional prior to discharge home.        Recommendations for follow up therapy are one component of a multi-disciplinary discharge planning process, led by the attending physician.  Recommendations may be updated based on patient status, additional functional criteria and insurance authorization.   Follow Up Recommendations  Follow physician's recommendations for discharge plan and follow up therapies    Assistance Recommended at Discharge Intermittent Supervision/Assistance  Patient can return home with the following A little help with walking and/or transfers;A little help with  bathing/dressing/bathroom;Assistance with cooking/housework;Help with stairs or ramp for entrance;Assist for transportation    Functional Status Assessment  Patient has had a recent decline in their functional status and demonstrates the ability to make significant improvements in function in a reasonable and predictable amount of time.  Equipment Recommendations  BSC/3in1;Tub/shower bench    Recommendations for Other Services       Precautions / Restrictions Precautions Precautions: Anterior Hip Precaution Comments: strict anterior hip precautions + no hip/knee ROM exercises for now Required Braces or Orthoses: Knee Immobilizer - Left Knee Immobilizer - Left: On when out of bed or walking Restrictions Weight Bearing Restrictions: Yes LLE Weight Bearing: Touchdown weight bearing      Mobility Bed Mobility                    Transfers                          Balance Overall balance assessment: Needs assistance Sitting-balance support: No upper extremity supported, Feet supported Sitting balance-Leahy Scale: Good     Standing balance support: During functional activity, Reliant on assistive device for balance Standing balance-Leahy Scale: Poor                             ADL either performed or assessed with clinical judgement   ADL Overall ADL's : Needs assistance/impaired Eating/Feeding: Independent   Grooming: Set up;Sitting   Upper Body Bathing: Set up;Sitting   Lower Body Bathing: Maximal assistance;Sit to/from stand   Upper Body Dressing : Set up;Sitting   Lower Body Dressing: Sit to/from stand;Maximal  assistance   Toilet Transfer: Minimal assistance;+2 for safety/equipment;BSC/3in1;Rolling walker (2 wheels);Stand-pivot   Toileting- Clothing Manipulation and Hygiene: Maximal assistance;Sit to/from stand       Functional mobility during ADLs: Rolling walker (2 wheels);Minimal assistance General ADL Comments: Min assist for  supine to sit for LEs - patient moved LEs at the time to transfer to edge of bed and use of bed rail. Min guard to stand from elevated bed height. With use of walker only able to take one step. Unable to use extremities to lift weight off of LEs  effectively and only able to scoot right foot. Min assist to extend leg with sitting. Limited by pain.     Vision Patient Visual Report: No change from baseline       Perception     Praxis      Pertinent Vitals/Pain Pain Assessment Pain Assessment: 0-10 Pain Score: 8  Pain Location: L hip when mobilizing Pain Descriptors / Indicators: Sharp, Operative site guarding, Grimacing, Guarding Pain Intervention(s): Limited activity within patient's tolerance, Monitored during session     Hand Dominance Right   Extremity/Trunk Assessment Upper Extremity Assessment Upper Extremity Assessment: RUE deficits/detail;LUE deficits/detail RUE Deficits / Details: WFL ROM, grossly 4/5 RUE Sensation: WNL RUE Coordination: WNL LUE Deficits / Details: 3-/5 shoulder ( recent decompression and getting OP therapy for shoulder pain), otherwise normal ROM and 4/5 strength LUE Sensation: WNL LUE Coordination: WNL   Lower Extremity Assessment Lower Extremity Assessment: Defer to PT evaluation   Cervical / Trunk Assessment Cervical / Trunk Assessment: Normal   Communication Communication Communication: No difficulties   Cognition Arousal/Alertness: Awake/alert Behavior During Therapy: WFL for tasks assessed/performed Overall Cognitive Status: Within Functional Limits for tasks assessed                                       General Comments       Exercises     Shoulder Instructions      Home Living Family/patient expects to be discharged to:: Private residence Living Arrangements: Spouse/significant other Available Help at Discharge: Family;Available PRN/intermittently Type of Home: House Home Access: Stairs to enter [employer] of Steps: 3 Entrance Stairs-Rails: Right;Left Home Layout: One level     Bathroom Shower/Tub: Teacher, early years/pre: Standard     Home Equipment: Conservation officer, nature (2 wheels);Cane - single point;Adaptive equipment Adaptive Equipment: Reacher        Prior Functioning/Environment Prior Level of Function : Independent/Modified Independent             Mobility Comments: using cane          OT Problem List: Decreased strength;Decreased range of motion;Decreased activity tolerance;Impaired balance (sitting and/or standing);Decreased knowledge of use of DME or AE;Decreased knowledge of precautions;Pain;Increased edema;Impaired UE functional use      OT Treatment/Interventions: Self-care/ADL training;Therapeutic exercise;DME and/or AE instruction;Therapeutic activities;Balance training;Patient/family education    OT Goals(Current goals can be found in the care plan section) Acute Rehab OT Goals Patient Stated Goal: to do as much as possible OT Goal Formulation: With patient Time For Goal Achievement: 06/05/22 Potential to Achieve Goals: Good  OT Frequency: Min 2X/week    Co-evaluation              AM-PAC OT "6 Clicks" Daily Activity     Outcome Measure Help from another person eating meals?: None Help from another person taking care of personal  grooming?: A Little Help from another person toileting, which includes using toliet, bedpan, or urinal?: A Lot Help from another person bathing (including washing, rinsing, drying)?: A Lot Help from another person to put on and taking off regular upper body clothing?: A Little Help from another person to put on and taking off regular lower body clothing?: A Lot 6 Click Score: 16   End of Session Equipment Utilized During Treatment: Rolling walker (2 wheels) Nurse Communication: Mobility status  Activity Tolerance: Patient limited by pain Patient left: in chair;with call bell/phone within reach  OT  Visit Diagnosis: Other abnormalities of gait and mobility (R26.89);Pain Pain - Right/Left: Left Pain - part of body: Hip                Time: 3729-0211 OT Time Calculation (min): 26 min Charges:  OT General Charges $OT Visit: 1 Visit OT Evaluation $OT Eval Low Complexity: 1 Low OT Treatments $Therapeutic Activity: 8-22 mins  Gustavo Lah, OTR/L Acute Care Rehab Services  Office [phone]   Lenward Chancellor 05/22/2022, 11:21 AM

## 2022-05-22 NOTE — Progress Notes (Signed)
Patient ID: Joshua Cole, male   DOB: 08-Mar-1961, 61 y.o.   MRN: [id number] Pain is much better controlled today.  He did work with physical therapy.  Labs are sideways today with essentially stable hemoglobin.  Overall he is working to regain mobility slowly.  He will plan to work with physical therapy again several times today.  Antibiotics are 6 weeks of Ancef per the ID team.  Will need case management assistance setting this up for home infusions.  Plan for discharge possibly early this week pending mobility.

## 2022-05-22 NOTE — Plan of Care (Signed)
  Problem: Clinical Measurements: Goal: Respiratory complications will improve Outcome: Progressing   Problem: Clinical Measurements: Goal: Cardiovascular complication will be avoided Outcome: Progressing   Problem: Activity: Goal: Risk for activity intolerance will decrease Outcome: Progressing   Problem: Safety: Goal: Ability to remain free from injury will improve Outcome: Progressing   

## 2022-05-23 ENCOUNTER — Inpatient Hospital Stay (HOSPITAL_COMMUNITY): Payer: Medicaid Other

## 2022-05-23 ENCOUNTER — Encounter (HOSPITAL_COMMUNITY): Payer: Self-pay | Admitting: Orthopaedic Surgery

## 2022-05-23 LAB — GLUCOSE, CAPILLARY
Glucose-Capillary: 165 mg/dL — ABNORMAL HIGH (ref 70–99)
Glucose-Capillary: 160 mg/dL — ABNORMAL HIGH (ref 70–99)
Glucose-Capillary: 146 mg/dL — ABNORMAL HIGH (ref 70–99)
Glucose-Capillary: 184 mg/dL — ABNORMAL HIGH (ref 70–99)

## 2022-05-23 NOTE — Plan of Care (Signed)

## 2022-05-23 NOTE — Plan of Care (Signed)
Plan of care reviewed and discussed. °

## 2022-05-23 NOTE — Progress Notes (Signed)
Physical Therapy Treatment Patient Details Name: Joshua Cole MRN: [id number] DOB: [birth year] Today's Date: 05/23/2022   History of Present Illness Patient is a 61 yo male admitted with L prosthetic hip joint infection. S/P removal of hardware + spacer 05/20/22. 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    PT Comments    POD # 3 Pt OOB via OT.  Rolled pt out in hallway.  Attempted amb required + 2 assist.  General transfer comment: Assist to power up from recliner. Cues for safety, technique, hand/LE placement. Increased time. General Gait Details: VERY limited amb distance due to effort and increased hip pain.  Recliner following for safety. General bed mobility comments: demonstarted and instructed how to use belt to self assist LE however pt still required Mod Assist + 2 back to bed.  Educated on elevation L LE and positioning.   Applied ICE. Pt will need ST Rehab at SNF to address mobility and functional decline prior to safely returning home.   Recommendations for follow up therapy are one component of a multi-disciplinary discharge planning process, led by the attending physician.  Recommendations may be updated based on patient status, additional functional criteria and insurance authorization.  Follow Up Recommendations  Follow physician's recommendations for discharge plan and follow up therapies     Assistance Recommended at Discharge Frequent or constant Supervision/Assistance  Patient can return home with the following Two people to help with walking and/or transfers;Two people to help with bathing/dressing/bathroom;Help with stairs or ramp for entrance;Assistance with cooking/housework;Assist for transportation   Equipment Recommendations  Wheelchair (measurements PT);Wheelchair cushion (measurements PT);BSC/3in1    Recommendations for Other Services       Precautions / Restrictions Precautions Precautions: Anterior  Hip Precaution Comments: strict anterior hip precautions + no hip/knee ROM exercises for now. Pt able to recall precautions/restrictions Required Braces or Orthoses: Knee Immobilizer - Left Knee Immobilizer - Left: On when out of bed or walking Restrictions Weight Bearing Restrictions: Yes LLE Weight Bearing: Touchdown weight bearing     Mobility  Bed Mobility Overal bed mobility: Needs Assistance Bed Mobility: Sit to Supine       Sit to supine: Mod assist, +2 for physical assistance, +2 for safety/equipment, HOB elevated   General bed mobility comments: demonstarted and instructed how to use belt to self assist LE however pt still required Mod Assist + 2 back to bed.  Educated on elevation L LE and positioning.   Applied ICE.    Transfers Overall transfer level: Needs assistance Equipment used: Rolling walker (2 wheels) Transfers: Sit to/from Stand Sit to Stand: Min assist, +2 physical assistance, +2 safety/equipment, From elevated surface           General transfer comment: Assist to power up from recliner. Cues for safety, technique, hand/LE placement. Increased time.    Ambulation/Gait Ambulation/Gait assistance: Min assist, +2 physical assistance, +2 safety/equipment Gait Distance (Feet): 2 Feet Assistive device: Rolling walker (2 wheels) Gait Pattern/deviations: Step-to pattern Gait velocity: decreased     General Gait Details: VERY limited amb distance due to effort and increased hip pain.  Recliner following for safety.   Stairs             Wheelchair Mobility    Modified Rankin (Stroke Patients Only)       Balance  Cognition Arousal/Alertness: Awake/alert Behavior During Therapy: WFL for tasks assessed/performed Overall Cognitive Status: Within Functional Limits for tasks assessed                                 General Comments: AxO x 3 very pleasant and  motivated        Exercises      General Comments        Pertinent Vitals/Pain Pain Assessment Pain Assessment: Faces Faces Pain Scale: Hurts even more Pain Location: L hip when mobilizing Pain Descriptors / Indicators: Sharp, Operative site guarding, Grimacing, Guarding Pain Intervention(s): Monitored during session, Premedicated before session, Ice applied    Home Living                          Prior Function            PT Goals (current goals can now be found in the care plan section) Progress towards PT goals: Progressing toward goals    Frequency    7X/week      PT Plan Current plan remains appropriate    Co-evaluation              AM-PAC PT "6 Clicks" Mobility   Outcome Measure  Help needed turning from your back to your side while in a flat bed without using bedrails?: A Lot Help needed moving from lying on your back to sitting on the side of a flat bed without using bedrails?: A Lot Help needed moving to and from a bed to a chair (including a wheelchair)?: A Lot Help needed standing up from a chair using your arms (e.g., wheelchair or bedside chair)?: A Lot Help needed to walk in hospital room?: A Lot Help needed climbing 3-5 steps with a railing? : A Lot 6 Click Score: 12    End of Session Equipment Utilized During Treatment: Gait belt;Left knee immobilizer Activity Tolerance: Patient limited by pain;Patient limited by fatigue Patient left: in bed;with bed alarm set;with call bell/phone within reach   PT Visit Diagnosis: Pain;Difficulty in walking, not elsewhere classified (R26.2);Other abnormalities of gait and mobility (R26.89) Pain - Right/Left: Left Pain - part of body: Hip     Time: 7989-2119 PT Time Calculation (min) (ACUTE ONLY): 24 min  Charges:  $Gait Training: 8-22 mins $Therapeutic Activity: 8-22 mins                     Rica Koyanagi  PTA Acute  Rehabilitation Services Office M-F          [phone] Weekend  pager [phone]

## 2022-05-23 NOTE — Progress Notes (Signed)
Occupational Therapy Treatment Patient Details Name: Joshua Cole MRN: [id number] DOB: 1961/05/11 Today's Date: 05/23/2022   History of present illness Patient is a 61 yo male admitted with L prosthetic hip joint infection. S/P removal of hardware + spacer 05/20/22. 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   OT comments  Patient was educated on using reacher for LB Dressing with mod A still needed to clear LLE off ground to attempt to don simulated pants. Patient was educated on using larger looser clothing over ki v.s. donning pants under [place]. Patient verbalized understanding. Attempted scoot transfer into recliner with patient having increased forwards scooting v.s. lateral scooting thus stopped for safety. Patient was min A +2 to transfer from edge of bed with height raised to recliner in room. Patient needed physical assist to kick out LLE to maintain precautions. Patient would need 24/7 caregiver support in next level of care. Patient's discharge plan remains appropriate at this time. OT will continue to follow acutely.     Recommendations for follow up therapy are one component of a multi-disciplinary discharge planning process, led by the attending physician.  Recommendations may be updated based on patient status, additional functional criteria and insurance authorization.    Follow Up Recommendations  Follow physician's recommendations for discharge plan and follow up therapies    Assistance Recommended at Discharge Intermittent Supervision/Assistance  Patient can return home with the following  A little help with walking and/or transfers;A little help with bathing/dressing/bathroom;Assistance with cooking/housework;Help with stairs or ramp for entrance;Assist for transportation   Equipment Recommendations  BSC/3in1;Tub/shower bench    Recommendations for Other Services      Precautions / Restrictions Precautions Precautions:  Anterior Hip Precaution Comments: strict anterior hip precautions + no hip/knee ROM exercises for now. Pt able to recall precautions/restrictions Required Braces or Orthoses: Knee Immobilizer - Left Knee Immobilizer - Left: On when out of bed or walking Restrictions Weight Bearing Restrictions: Yes LLE Weight Bearing: Touchdown weight bearing       Mobility Bed Mobility Overal bed mobility: Needs Assistance       Supine to sit: HOB elevated, Mod assist     General bed mobility comments: mod A to manuver BLE while maintaining precautions.    Transfers                         Balance Overall balance assessment: Needs assistance Sitting-balance support: No upper extremity supported, Feet supported Sitting balance-Leahy Scale: Good     Standing balance support: During functional activity, Reliant on assistive device for balance, Bilateral upper extremity supported Standing balance-Leahy Scale: Poor                             ADL either performed or assessed with clinical judgement   ADL Overall ADL's : Needs assistance/impaired     Grooming: Set up;Sitting;Oral care Grooming Details (indicate cue type and reason): in recliner               Lower Body Dressing Details (indicate cue type and reason): patient was educated on usign reacher to get pants over BLE with operated leg first. patient had increased difficulty with lifting LLE with physical assist needed to make it work. patient reported girlfriend would be able to assist with LB dressing tasks. patient was educated on using grocery bag compensatory strategy for girlfriend donning compression stockings. patient verbalized understanding.  Toilet Transfer: Minimal assistance;+2 for safety/equipment;+2 for physical assistance Toilet Transfer Details (indicate cue type and reason): to transfer from edge of bed. attempted scoot transfer +1 with patient unable to scoot posterioly. +2 for transfers to  ensure WB status is maintained.           General ADL Comments: patient was noted to have L knee drifting to external rotation at rest with pillow added to side of recliner to encourage midline posture.    Extremity/Trunk Assessment              Vision Patient Visual Report: No change from baseline     Perception     Praxis      Cognition Arousal/Alertness: Awake/alert Behavior During Therapy: WFL for tasks assessed/performed Overall Cognitive Status: Within Functional Limits for tasks assessed                                          Exercises      Shoulder Instructions       General Comments      Pertinent Vitals/ Pain       Pain Assessment Pain Assessment: 0-10 Pain Score: 6  Pain Location: L hip when mobilizing Pain Descriptors / Indicators: Sharp, Operative site guarding, Grimacing, Guarding Pain Intervention(s): Limited activity within patient's tolerance, Monitored during session, Premedicated before session, Repositioned  Home Living                                          Prior Functioning/Environment              Frequency  Min 2X/week        Progress Toward Goals  OT Goals(current goals can now be found in the care plan section)  Progress towards OT goals: Progressing toward goals     Plan Discharge plan remains appropriate    Co-evaluation                 AM-PAC OT "6 Clicks" Daily Activity     Outcome Measure   Help from another person eating meals?: None Help from another person taking care of personal grooming?: A Little Help from another person toileting, which includes using toliet, bedpan, or urinal?: A Lot Help from another person bathing (including washing, rinsing, drying)?: A Lot Help from another person to put on and taking off regular upper body clothing?: A Little Help from another person to put on and taking off regular lower body clothing?: A Lot 6 Click Score:  16    End of Session Equipment Utilized During Treatment: Rolling walker (2 wheels);Gait belt;Left knee immobilizer  OT Visit Diagnosis: Other abnormalities of gait and mobility (R26.89);Pain Pain - Right/Left: Left Pain - part of body: Hip   Activity Tolerance Patient limited by pain   Patient Left in chair;with call bell/phone within reach   Nurse Communication Mobility status        Time: 8338-2505 OT Time Calculation (min): 38 min  Charges: OT General Charges $OT Visit: 1 Visit OT Treatments $Self Care/Home Management : 23-37 mins $Therapeutic Activity: 8-22 mins  Rennie Plowman, MS Acute Rehabilitation Department Office# [phone]   Marcellina Millin 05/23/2022, 4:49 PM

## 2022-05-23 NOTE — Progress Notes (Signed)
Patient ID: Joshua Cole, male   DOB: 1961-05-11, 61 y.o.   MRN: [id number] The patient's vital signs are stable this morning.  He is having an incredibly hard and difficult time with mobility secondary to him being a fall risk with only being able to allow touchdown weightbearing on his left hip.  Physical therapy is recommending short-term skilled nursing placement.  Understand this may be a significant issue given the patient's insurance status.  He is not safe to be discharged without significant assistance given his limited mobility and fall risk.

## 2022-05-24 ENCOUNTER — Other Ambulatory Visit: Payer: Self-pay

## 2022-05-24 LAB — TYPE AND SCREEN
ABO/RH(D): O POS
Antibody Screen: POSITIVE
Donor AG Type: NEGATIVE
Donor AG Type: NEGATIVE
Unit division: 0
Unit division: 0

## 2022-05-24 LAB — CBC
HCT: 25.7 % — ABNORMAL LOW (ref 39.0–52.0)
Hemoglobin: 8.4 g/dL — ABNORMAL LOW (ref 13.0–17.0)
MCH: 32.3 pg (ref 26.0–34.0)
MCHC: 32.7 g/dL (ref 30.0–36.0)
MCV: 98.8 fL (ref 80.0–100.0)
Platelets: 198 10*3/uL (ref 150–400)
RBC: 2.6 MIL/uL — ABNORMAL LOW (ref 4.22–5.81)
RDW: 14.2 % (ref 11.5–15.5)
WBC: 9.6 10*3/uL (ref 4.0–10.5)
nRBC: 0 % (ref 0.0–0.2)

## 2022-05-24 LAB — BPAM RBC
Blood Product Expiration Date: 202310132359
Blood Product Expiration Date: 202310132359
ISSUE DATE / TIME: 202309081715
Unit Type and Rh: 9500
Unit Type and Rh: 9500

## 2022-05-24 LAB — GLUCOSE, CAPILLARY
Glucose-Capillary: 155 mg/dL — ABNORMAL HIGH (ref 70–99)
Glucose-Capillary: 172 mg/dL — ABNORMAL HIGH (ref 70–99)
Glucose-Capillary: 182 mg/dL — ABNORMAL HIGH (ref 70–99)
Glucose-Capillary: 141 mg/dL — ABNORMAL HIGH (ref 70–99)

## 2022-05-24 NOTE — Progress Notes (Signed)
Patient ID: Joshua Cole, male   DOB: 06/19/1961, 61 y.o.   MRN: [id number] The patient is awake and alert.  He is stable medically.  His vital signs are stable.  His white blood cell count is normal.  His hemoglobin is stable at 8.4.  I did obtain an x-ray of his left hip and it is intact in terms of the antibiotic spacer.  He is slowly working on mobility.  OT has now seen the patient and PT continues to see the patient.  A consult has been put in to social work for assisting in disposition for this patient because of his difficulty with mobility and the need for short-term skilled nursing.  There is otherwise no acute changes.  He will remain here until a disposition is made from a standpoint of to where he can go safely from here.  He remains on IV antibiotics and will be on IV antibiotics for at least 6 weeks.

## 2022-05-24 NOTE — Progress Notes (Signed)
Occupational Therapy Treatment Patient Details Name: Joshua Cole MRN: [id number] DOB: [birth year] Today's Date: 05/24/2022   History of present illness Patient is a 61 yo male admitted with L prosthetic hip joint infection. S/P removal of hardware + spacer 05/20/22. 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   OT comments  The pt was found in the semi-fowler's position & he was motivated to participate in the session. He reported having 5/10 L LE pain at rest. OT provided further reinforcement on the pt's anterior hip precautions, for which he presented with good recall in this regard. He required min assist with slightly increased time and effort for supine to sit, using a belt to help maneuver his L LE to the EOB. He further required mod assist to stand using a rolling walker, and min assist with cues for transfer technique, in order to stand-pivot to the chair. He needed occasional cues for L LE positioning in standing, to ensure adherence to L LE touch-down weight bearing. Once seated in the chair, he had difficulty with posterior scooting, subsequently requiring mod assist, cues, and increased effort to perform. He further performed grooming seated at chair level. He will continue to benefit from OT services to maximize his ADL performance. Give his current need for increased assistance with self-care & functional transfers, short-term sub-acute rehab is recommended, upon his hospital discharge.    Recommendations for follow up therapy are one component of a multi-disciplinary discharge planning process, led by the attending physician.  Recommendations may be updated based on patient status, additional functional criteria and insurance authorization.    Follow Up Recommendations  Follow physician's recommendations for discharge plan and follow up therapies       Patient can return home with the following  Assistance with  cooking/housework;Help with stairs or ramp for entrance;Assist for transportation;A lot of help with bathing/dressing/bathroom   Equipment Recommendations  BSC/3in1;Tub/shower bench    Recommendations for Other Services      Precautions / Restrictions Precautions Precautions: Anterior Hip Precaution Comments: strict anterior hip precautions + no hip/knee ROM exercises for now. Pt able to recall precautions/restrictions Required Braces or Orthoses: Knee Immobilizer - Left Knee Immobilizer - Left: On when out of bed or walking Restrictions Weight Bearing Restrictions: Yes LLE Weight Bearing: Touchdown weight bearing       Mobility Bed Mobility Overal bed mobility: Needs Assistance       Supine to sit: HOB elevated, Min assist     General bed mobility comments: pt used belt to self assist L LE, however required some assistance and occasional cues for best transfer technique    Transfers Overall transfer level: Needs assistance Equipment used: Rolling walker (2 wheels) Transfers: Sit to/from Stand Sit to Stand: Mod assist           General transfer comment: Cues provided for pushing up from the bed with at least 1 upper extremity & L LE positioning to maintain touch-down weight bearing     Balance       Sitting balance - Comments: Static sitting-good, dynamic sitting-fair+     Standing balance-Leahy Scale: Poor            ADL either performed or assessed with clinical judgement   ADL   Eating/Feeding: Independent Eating/Feeding Details (indicate cue type and reason): based on clinical judgement Grooming: Set up;Sitting Grooming Details (indicate cue type and reason): He performed face washing & teeth brushing in sitting at recliner level  Upper Body Dressing : Set up;Sitting   Lower Body Dressing: Maximal assistance         Vision   Additional Comments: WFL for participation in the session          Cognition Arousal/Alertness:  Awake/alert Behavior During Therapy: WFL for tasks assessed/performed Overall Cognitive Status: Within Functional Limits for tasks assessed          General Comments: Cooperative and motivated                   Pertinent Vitals/ Pain       Pain Assessment Pain Assessment: 0-10 Pain Score: 5  Pain Location: L hip Pain Intervention(s): Ice applied, Repositioned   Frequency  Min 2X/week        Progress Toward Goals  OT Goals(current goals can now be found in the care plan section)  Progress towards OT goals: Progressing toward goals  Acute Rehab OT Goals Patient Stated Goal: To achieve maximal functional independence OT Goal Formulation: With patient Time For Goal Achievement: 05/24/22 Potential to Achieve Goals: Good  Plan Discharge plan remains appropriate       AM-PAC OT "6 Clicks" Daily Activity     Outcome Measure   Help from another person eating meals?: None Help from another person taking care of personal grooming?: A Little Help from another person toileting, which includes using toliet, bedpan, or urinal?: A Lot Help from another person bathing (including washing, rinsing, drying)?: A Lot Help from another person to put on and taking off regular upper body clothing?: A Little Help from another person to put on and taking off regular lower body clothing?: A Lot 6 Click Score: 16    End of Session Equipment Utilized During Treatment: Rolling walker (2 wheels);Gait belt;Left knee immobilizer  OT Visit Diagnosis: Pain;Unsteadiness on feet (R26.81) Pain - Right/Left: Left Pain - part of body: Hip   Activity Tolerance Patient limited by pain   Patient Left in chair;with call bell/phone within reach   Nurse Communication  (The pt's nurse cleared the pt for therapy)        Time: 6754-4920 OT Time Calculation (min): 27 min  Charges: OT General Charges $OT Visit: 1 Visit OT Treatments $Self Care/Home Management : 8-22 mins $Therapeutic  Activity: 8-22 mins    Leota Sauers, OTR/L 05/24/2022, 10:40 AM

## 2022-05-24 NOTE — TOC Progression Note (Addendum)
Transition of Care [facility]) - Progression Note    Patient Details  Name: Joshua Cole MRN: [id number] Date of Birth: 1961-01-22  [facility] CM/SW Contact  Ross Ludwig, [place] Phone Number: 05/24/2022, 12:21 PM  Clinical Narrative:     CSW spoke to [facility] from [place] to inform her that patient may need home IV antibiotics and does not have any insurance.  She is aware and will follow patient's case.        Expected Discharge Plan and [place] with home IV antibiotics.                                               Social Determinants of Health (SDOH) Interventions    Readmission Risk Interventions     No data to display

## 2022-05-25 LAB — GLUCOSE, CAPILLARY
Glucose-Capillary: 161 mg/dL — ABNORMAL HIGH (ref 70–99)
Glucose-Capillary: 142 mg/dL — ABNORMAL HIGH (ref 70–99)
Glucose-Capillary: 159 mg/dL — ABNORMAL HIGH (ref 70–99)
Glucose-Capillary: 152 mg/dL — ABNORMAL HIGH (ref 70–99)

## 2022-05-25 NOTE — NC FL2 (Signed)
[place] LEVEL OF CARE SCREENING TOOL     IDENTIFICATION  Patient Name: Joshua Cole Birthdate: 1961-06-20 Sex: male Admission Date (Current Location): 05/20/2022  [facility] and [place] Number:  Herbalist and Address:  [facility],  [place], [place]      Provider Number: [id number]  Attending Physician Name and Address:  Mcarthur Rossetti  Relative Name and Phone Number:       Current Level of Care: Hospital Recommended Level of Care: [place] Prior Approval Number:    Date Approved/Denied:   PASRR Number: [id number]  Discharge Plan: SNF    Current Diagnoses: Patient Active Problem List   Diagnosis Date Noted   Left hip prosthetic joint infection ([place]) 05/19/2022   Acid reflux 03/31/2022   Left hip pain 03/31/2022   Complete tear of left rotator cuff 02/10/2022   Headache 11/02/2021   Peripheral neuropathy 10/05/2021   Acute gout 08/26/2021   Rotator cuff tear 08/26/2021   Encounter to establish care 08/25/2021   Type 2 diabetes mellitus without complications ([place]) 25/85/2778   History of gout 08/25/2021   Left shoulder pain 08/25/2021   Duodenal ulcer    History of colonic polyps    Acute blood loss anemia    Heart block AV complete (HCC)    Abnormal LFTs    MSSA bacteremia 07/08/2021   Acute bacterial endocarditis    Heart block    Bacteremia    Hyponatremia 24/23/5361   Acute metabolic encephalopathy 44/31/5400   Joint pain 07/03/2021   Generalized weakness 07/03/2021   SIRS (systemic inflammatory response syndrome) ([place]) 07/03/2021   Hypokalemia 07/03/2021   Hyperglycemia 07/03/2021   Thrombocytopenia ([place]) 07/03/2021   Nausea vomiting and diarrhea 07/03/2021   Gout     Orientation RESPIRATION BLADDER Height & Weight     Self, Time, Situation, Place  Normal Continent Weight: 103 kg Height:  '5\' 10"'$  (177.8 cm)  BEHAVIORAL SYMPTOMS/MOOD NEUROLOGICAL BOWEL  NUTRITION STATUS      Continent Diet  AMBULATORY STATUS COMMUNICATION OF NEEDS Skin   Extensive Assist   Normal                       Personal Care Assistance Level of Assistance  Bathing, Dressing, Total care Bathing Assistance: Limited assistance   Dressing Assistance: Limited assistance Total Care Assistance: Limited assistance   Functional Limitations Info             SPECIAL CARE FACTORS FREQUENCY  PT (By licensed PT), OT (By licensed OT)     PT Frequency: 5x weekly OT Frequency: 5x weekly            Contractures Contractures Info: Not present    Additional Factors Info  Code Status, Allergies Code Status Info: Full Allergies Info: NKA           Current Medications (05/25/2022):  This is the current hospital active medication list Current Facility-Administered Medications  Medication Dose Route Frequency Provider Last Rate Last Admin   0.9 %  sodium chloride infusion   Intravenous Continuous Mcarthur Rossetti, MD 10 mL/hr at 05/24/22 1600 Infusion Verify at 05/24/22 1600   acetaminophen (TYLENOL) tablet 325-650 mg  325-650 mg Oral Q6H PRN Mcarthur Rossetti, MD       albuterol (PROVENTIL) (2.5 MG/3ML) 0.083% nebulizer solution 3 mL  3 mL Inhalation Q6H PRN Mcarthur Rossetti, MD       alum & Iris Pert  hydroxide-simeth (MAALOX/MYLANTA) 200-200-20 MG/5ML suspension 30 mL  30 mL Oral Q4H PRN Mcarthur Rossetti, MD       aspirin chewable tablet 81 mg  81 mg Oral BID Mcarthur Rossetti, MD   81 mg at 05/25/22 0814   ceFAZolin (ANCEF) IVPB 2g/100 mL premix  2 g Intravenous Q8H Mcarthur Rossetti, MD 200 mL/hr at 05/25/22 0611 2 g at 05/25/22 1962   Chlorhexidine Gluconate Cloth 2 % PADS 6 each  6 each Topical Daily Mcarthur Rossetti, MD   6 each at 05/25/22 0819   docusate sodium (COLACE) capsule 100 mg  100 mg Oral BID Mcarthur Rossetti, MD   100 mg at 05/25/22 2297   gabapentin (NEURONTIN) capsule 300 mg  300 mg Oral BID  Mcarthur Rossetti, MD   300 mg at 05/25/22 9892   HYDROmorphone (DILAUDID) injection 0.5-1 mg  0.5-1 mg Intravenous Q4H PRN Mcarthur Rossetti, MD       HYDROmorphone (DILAUDID) tablet 2-3 mg  2-3 mg Oral Q4H PRN Mcarthur Rossetti, MD       insulin aspart (novoLOG) injection 0-5 Units  0-5 Units Subcutaneous QHS Mcarthur Rossetti, MD   3 Units at 05/20/22 2340   insulin aspart (novoLOG) injection 0-9 Units  0-9 Units Subcutaneous TID WC Mcarthur Rossetti, MD   2 Units at 05/25/22 0815   insulin glargine-yfgn (SEMGLEE) injection 15 Units  15 Units Subcutaneous QHS Mcarthur Rossetti, MD   15 Units at 05/24/22 2246   menthol-cetylpyridinium (CEPACOL) lozenge 3 mg  1 lozenge Oral PRN Mcarthur Rossetti, MD       Or   phenol (CHLORASEPTIC) mouth spray 1 spray  1 spray Mouth/Throat PRN Mcarthur Rossetti, MD       metFORMIN (GLUCOPHAGE) tablet 500 mg  500 mg Oral BID WC Mcarthur Rossetti, MD   500 mg at 05/25/22 0814   methocarbamol (ROBAXIN) tablet 500 mg  500 mg Oral Q6H PRN Mcarthur Rossetti, MD   500 mg at 05/25/22 1194   Or   methocarbamol (ROBAXIN) 500 mg in dextrose 5 % 50 mL IVPB  500 mg Intravenous Q6H PRN Mcarthur Rossetti, MD       metoCLOPramide (REGLAN) tablet 5-10 mg  5-10 mg Oral Q8H PRN Mcarthur Rossetti, MD       Or   metoCLOPramide (REGLAN) injection 5-10 mg  5-10 mg Intravenous Q8H PRN Mcarthur Rossetti, MD       ondansetron [facility]) tablet 4 mg  4 mg Oral Q6H PRN Mcarthur Rossetti, MD   4 mg at 05/22/22 2138   Or   ondansetron (ZOFRAN) injection 4 mg  4 mg Intravenous Q6H PRN Mcarthur Rossetti, MD   4 mg at 05/23/22 1048   Oral care mouth rinse  15 mL Mouth Rinse PRN Mcarthur Rossetti, MD       oxyCODONE (Oxy IR/ROXICODONE) immediate release tablet 5-10 mg  5-10 mg Oral Q4H PRN Mcarthur Rossetti, MD   5 mg at 05/25/22 1040   pantoprazole (PROTONIX) EC tablet 40 mg  40 mg Oral Daily  Mcarthur Rossetti, MD   40 mg at 05/25/22 1740   polyvinyl alcohol (LIQUIFILM TEARS) 1.4 % ophthalmic solution 1 drop  1 drop Both Eyes PRN Mcarthur Rossetti, MD       sodium chloride flush (NS) 0.9 % injection 10-40 mL  10-40 mL Intracatheter Q12H Mcarthur Rossetti, MD   10 mL  at 05/21/22 1157   sodium chloride flush (NS) 0.9 % injection 10-40 mL  10-40 mL Intracatheter PRN Mcarthur Rossetti, MD   10 mL at 05/24/22 0504     Discharge Medications: Please see discharge summary for a list of discharge medications.  Relevant Imaging Results:  Relevant Lab Results:   Additional Information SSN [id number]  Joaquin Courts, RN

## 2022-05-25 NOTE — TOC Initial Note (Signed)
Transition of Care [facility]) - Initial/Assessment Note    Patient Details  Name: Joshua Cole MRN: [id number] Date of Birth: 05-20-1961  Transition of Care (TOC) CM/SW Contact:    Joaquin Courts, RN Phone Number: 05/25/2022, 3:56 PM  Clinical Narrative:                 CM noted consult for snf placement along with therapy recommendations and need for IV antibiotics.  Noted patient is uninsured, staffed with [facility] leadership.  FL2 faxed out for SNF with LOG offered.  Awaiting bed offers.  Expected Discharge Plan: Skilled Nursing Facility Barriers to Discharge: Continued Medical Work up   Patient Goals and CMS Choice Patient states their goals for this hospitalization and ongoing recovery are:: to get better      Expected Discharge Plan and Services Expected Discharge Plan: [place]                                              Prior Living Arrangements/Services     Patient language and need for interpreter reviewed:: Yes Do you feel safe going back to the place where you live?: Yes            Criminal Activity/Legal Involvement Pertinent to Current Situation/Hospitalization: No - Comment as needed  Activities of Daily Living Home Assistive Devices/Equipment: Cane (specify quad or straight), Walker (specify type), CBG Meter, Dentures (specify type), Eyeglasses ADL Screening (condition at time of admission) Patient's cognitive ability adequate to safely complete daily activities?: Yes Is the patient deaf or have difficulty hearing?: No Does the patient have difficulty seeing, even when wearing glasses/contacts?: No Does the patient have difficulty concentrating, remembering, or making decisions?: No Patient able to express need for assistance with ADLs?: Yes Does the patient have difficulty dressing or bathing?: No Independently performs ADLs?: Yes (appropriate for developmental age) Does the patient have difficulty walking or climbing  stairs?: Yes Weakness of Legs: Left Weakness of Arms/Hands: Left  Permission Sought/Granted                  Emotional Assessment           Psych Involvement: No (comment)  Admission diagnosis:  Left hip prosthetic joint infection ([place]) [T84.52XA] Patient Active Problem List   Diagnosis Date Noted   Left hip prosthetic joint infection ([place]) 05/19/2022   Acid reflux 03/31/2022   Left hip pain 03/31/2022   Complete tear of left rotator cuff 02/10/2022   Headache 11/02/2021   Peripheral neuropathy 10/05/2021   Acute gout 08/26/2021   Rotator cuff tear 08/26/2021   Encounter to establish care 08/25/2021   Type 2 diabetes mellitus without complications ([place]) 36/14/4315   History of gout 08/25/2021   Left shoulder pain 08/25/2021   Duodenal ulcer    History of colonic polyps    Acute blood loss anemia    Heart block AV complete ([place])    Abnormal LFTs    MSSA bacteremia 07/08/2021   Acute bacterial endocarditis    Heart block    Bacteremia    Hyponatremia 40/04/6760   Acute metabolic encephalopathy 95/05/3266   Joint pain 07/03/2021   Generalized weakness 07/03/2021   SIRS (systemic inflammatory response syndrome) ([place]) 07/03/2021   Hypokalemia 07/03/2021   Hyperglycemia 07/03/2021   Thrombocytopenia ([place]) 07/03/2021   Nausea vomiting and diarrhea 07/03/2021   Gout  PCP:  Pcp, No Pharmacy:   [place] [address] Phone: [phone] Fax: [facility] [address] [place] [address] Phone: [phone] Fax: [phone]     Social Determinants of Health (SDOH) Interventions    Readmission Risk Interventions     No data to display

## 2022-05-25 NOTE — Progress Notes (Signed)
RN, Avon Gully paged IVT.  States tubing became disconnected during PT.  RN flushed, PICC and dsg were still intact.  Advised to use new tubing to connect to pt.

## 2022-05-25 NOTE — Plan of Care (Signed)
Plan of care reviewed and discussed. °

## 2022-05-25 NOTE — Progress Notes (Signed)
Physical Therapy Treatment Patient Details Name: Joshua Cole MRN: [id number] DOB: March 24, 1961 Today's Date: 05/25/2022   History of Present Illness Patient is a 61 yo male admitted with L prosthetic hip joint infection. S/P removal of hardware + spacer 05/20/22. 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    PT Comments    LATE ENTRY Pt seen 05/24/22 @ 14:30   Recommendations for follow up therapy are one component of a multi-disciplinary discharge planning process, led by the attending physician.  Recommendations may be updated based on patient status, additional functional criteria and insurance authorization.  Follow Up Recommendations  Follow physician's recommendations for discharge plan and follow up therapies     Assistance Recommended at Discharge    Patient can return home with the following Two people to help with walking and/or transfers;Two people to help with bathing/dressing/bathroom;Help with stairs or ramp for entrance;Assistance with cooking/housework;Assist for transportation   Equipment Recommendations  Wheelchair (measurements PT);Wheelchair cushion (measurements PT);BSC/3in1    Recommendations for Other Services       Precautions / Restrictions Precautions Precautions: Anterior Hip Precaution Comments: strict anterior hip precautions + no hip/knee ROM exercises for now. Pt able to recall precautions/restrictions Required Braces or Orthoses: Knee Immobilizer - Left Knee Immobilizer - Left: On when out of bed or walking Restrictions Weight Bearing Restrictions: Yes LLE Weight Bearing: Touchdown weight bearing     Mobility  Bed Mobility               General bed mobility comments: OOB in recliner    Transfers Overall transfer level: Needs assistance Equipment used: Rolling walker (2 wheels) Transfers: Sit to/from Stand Sit to Stand: Mod assist           General transfer comment: Cues  provided for pushing up from the bed with at least 1 upper extremity & L LE positioning to maintain touch-down weight bearing    Ambulation/Gait Ambulation/Gait assistance: Mod assist Gait Distance (Feet): 5 Feet Assistive device: Rolling walker (2 wheels) Gait Pattern/deviations: Step-to pattern, Decreased stance time - left Gait velocity: decreased     General Gait Details: VERY limited amb distance due to effort and increased hip pain.  Recliner following for safety.  Pt using gait belt hooked under foot to assist with forward steps by pulling on strap.  Unable to advance on his own due to pain and effort.   Stairs             Wheelchair Mobility    Modified Rankin (Stroke Patients Only)       Balance                                            Cognition Arousal/Alertness: Awake/alert Behavior During Therapy: WFL for tasks assessed/performed Overall Cognitive Status: Within Functional Limits for tasks assessed                                 General Comments: Cooperative and motivated        Exercises      General Comments        Pertinent Vitals/Pain Pain Assessment Pain Assessment: 0-10 Pain Score: 6  Pain Location: L hip Pain Descriptors / Indicators: Sharp, Operative site guarding, Grimacing, Guarding Pain Intervention(s): Limited activity within patient's tolerance, Repositioned,  Ice applied    Home Living                          Prior Function            PT Goals (current goals can now be found in the care plan section) Progress towards PT goals: Progressing toward goals    Frequency    7X/week      PT Plan Current plan remains appropriate    Co-evaluation              AM-PAC PT "6 Clicks" Mobility   Outcome Measure  Help needed turning from your back to your side while in a flat bed without using bedrails?: A Lot Help needed moving from lying on your back to sitting on the  side of a flat bed without using bedrails?: A Lot Help needed moving to and from a bed to a chair (including a wheelchair)?: A Lot Help needed standing up from a chair using your arms (e.g., wheelchair or bedside chair)?: A Lot Help needed to walk in hospital room?: A Lot Help needed climbing 3-5 steps with a railing? : A Lot 6 Click Score: 12    End of Session Equipment Utilized During Treatment: Gait belt;Left knee immobilizer Activity Tolerance: Patient limited by pain;Patient limited by fatigue Patient left: in chair;with call bell/phone within reach Nurse Communication: Mobility status PT Visit Diagnosis: Pain;Difficulty in walking, not elsewhere classified (R26.2);Other abnormalities of gait and mobility (R26.89) Pain - Right/Left: Left Pain - part of body: Hip     Time: 1430-1455 PT Time Calculation (min) (ACUTE ONLY): 25 min  Charges:                        Rica Koyanagi  PTA Acute  Rehabilitation Services Office M-F          [phone] Weekend pager [phone]

## 2022-05-25 NOTE — Progress Notes (Signed)
Physical Therapy Treatment Patient Details Name: Joshua Cole MRN: [id number] DOB: June 16, 1961 Today's Date: 05/25/2022   History of Present Illness Patient is a 61 yo male admitted with L prosthetic hip joint infection. S/P removal of hardware + spacer 05/20/22. 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    PT Comments    AxO x 3 very motivated.  Assisted OOB.  General bed mobility comments: using belt to self asisst L LE off bed required increased time and effort. General Gait Details: VERY limited amb distance due to effort and increased hip pain.  Recliner following for safety.  Pt using gait belt hooked under foot to assist with forward steps by pulling on strap.  Unable to advance on his own due to pain and effort. Recliner following for safety. Pt will need ST Rehab at SNF.   Recommendations for follow up therapy are one component of a multi-disciplinary discharge planning process, led by the attending physician.  Recommendations may be updated based on patient status, additional functional criteria and insurance authorization.  Follow Up Recommendations  Follow physician's recommendations for discharge plan and follow up therapies     Assistance Recommended at Discharge Frequent or constant Supervision/Assistance  Patient can return home with the following Two people to help with walking and/or transfers;Two people to help with bathing/dressing/bathroom;Help with stairs or ramp for entrance;Assistance with cooking/housework;Assist for transportation   Equipment Recommendations       Recommendations for Other Services       Precautions / Restrictions Precautions Precautions: Anterior Hip Precaution Comments: strict anterior hip precautions + no hip/knee ROM exercises for now. Pt able to recall precautions/restrictions Required Braces or Orthoses: Knee Immobilizer - Left Knee Immobilizer - Left: On when out of bed or  walking Restrictions Weight Bearing Restrictions: Yes LLE Weight Bearing: Touchdown weight bearing     Mobility  Bed Mobility Overal bed mobility: Needs Assistance Bed Mobility: Supine to Sit     Supine to sit: HOB elevated, Min assist     General bed mobility comments: using belt to self asisst L LE off bed required increased time and effort.    Transfers Overall transfer level: Needs assistance Equipment used: Rolling walker (2 wheels) Transfers: Sit to/from Stand Sit to Stand: Mod assist, Min assist           General transfer comment: Cues provided for pushing up from the bed with at least 1 upper extremity & L LE positioning to maintain touch-down weight bearing    Ambulation/Gait Ambulation/Gait assistance: Mod assist, +2 safety/equipment Gait Distance (Feet): 11 Feet Assistive device: Rolling walker (2 wheels) Gait Pattern/deviations: Step-to pattern, Decreased stance time - left Gait velocity: decreased     General Gait Details: VERY limited amb distance due to effort and increased hip pain.  Recliner following for safety.  Pt using gait belt hooked under foot to assist with forward steps by pulling on strap.  Unable to advance on his own due to pain and effort. Recliner following for safety.   Stairs             Wheelchair Mobility    Modified Rankin (Stroke Patients Only)       Balance                                            Cognition Arousal/Alertness: Awake/alert Behavior  During Therapy: WFL for tasks assessed/performed Overall Cognitive Status: Within Functional Limits for tasks assessed                                 General Comments: Cooperative and motivated        Exercises      General Comments        Pertinent Vitals/Pain Pain Assessment Pain Assessment: 0-10 Pain Score: 8  Pain Location: L hip Pain Descriptors / Indicators: Sharp, Operative site guarding, Grimacing, Guarding Pain  Intervention(s): Monitored during session, Premedicated before session, Repositioned, Ice applied    Home Living                          Prior Function            PT Goals (current goals can now be found in the care plan section) Progress towards PT goals: Progressing toward goals    Frequency    7X/week      PT Plan Current plan remains appropriate    Co-evaluation              AM-PAC PT "6 Clicks" Mobility   Outcome Measure  Help needed turning from your back to your side while in a flat bed without using bedrails?: A Lot Help needed moving from lying on your back to sitting on the side of a flat bed without using bedrails?: A Lot Help needed moving to and from a bed to a chair (including a wheelchair)?: A Lot Help needed standing up from a chair using your arms (e.g., wheelchair or bedside chair)?: A Lot Help needed to walk in hospital room?: A Lot Help needed climbing 3-5 steps with a railing? : A Lot 6 Click Score: 12    End of Session Equipment Utilized During Treatment: Gait belt;Left knee immobilizer Activity Tolerance: Patient limited by pain;Patient limited by fatigue Patient left: in chair;with call bell/phone within reach Nurse Communication: Mobility status PT Visit Diagnosis: Pain;Difficulty in walking, not elsewhere classified (R26.2);Other abnormalities of gait and mobility (R26.89) Pain - Right/Left: Left Pain - part of body: Hip     Time: 0354-6568 PT Time Calculation (min) (ACUTE ONLY): 24 min  Charges:  $Gait Training: 8-22 mins $Therapeutic Activity: 8-22 mins                     Rica Koyanagi  PTA Acute  Rehabilitation Services Office M-F          [phone] Weekend pager [phone]

## 2022-05-25 NOTE — Progress Notes (Signed)
Patient ID: Joshua Cole, male   DOB: 06-30-1961, 61 y.o.   MRN: [id number] The patient still remains medically stable.  I did change his left hip dressing today.  The drainage is minimal.  There is fullness of the soft tissue to be expected from likely postoperative seroma.  Therapy is still working on his mobility.  He is here until a safe disposition is established.  The other limiting factors are the need for IV antibiotics.  The transitional care team is working on things as well given the patient's lack of insurance.

## 2022-05-25 NOTE — Plan of Care (Signed)
  Problem: Health Behavior/Discharge Planning: Goal: Ability to manage health-related needs will improve Outcome: Progressing   Problem: Clinical Measurements: Goal: Ability to maintain clinical measurements within normal limits will improve Outcome: Progressing   Problem: Activity: Goal: Risk for activity intolerance will decrease Outcome: Progressing   Problem: Coping: Goal: Level of anxiety will decrease Outcome: Progressing   Problem: Pain Managment: Goal: General experience of comfort will improve Outcome: Progressing   Problem: Safety: Goal: Ability to remain free from injury will improve Outcome: Progressing   Problem: Skin Integrity: Goal: Risk for impaired skin integrity will decrease Outcome: Progressing

## 2022-05-26 LAB — GLUCOSE, CAPILLARY
Glucose-Capillary: 138 mg/dL — ABNORMAL HIGH (ref 70–99)
Glucose-Capillary: 132 mg/dL — ABNORMAL HIGH (ref 70–99)
Glucose-Capillary: 144 mg/dL — ABNORMAL HIGH (ref 70–99)
Glucose-Capillary: 133 mg/dL — ABNORMAL HIGH (ref 70–99)

## 2022-05-26 MED ORDER — POLYETHYLENE GLYCOL 3350 17 G PO PACK
17.0000 g | PACK | Freq: Every day | ORAL | Status: DC | PRN
  Administered 2022-05-26: 17 g via ORAL
  Filled 2022-05-26: qty 1

## 2022-05-26 MED ORDER — BISACODYL 10 MG RE SUPP
10.0000 mg | Freq: Every day | RECTAL | Status: DC | PRN
  Administered 2022-05-26: 10 mg via RECTAL
  Filled 2022-05-26: qty 1

## 2022-05-26 NOTE — Progress Notes (Signed)
Physical Therapy Treatment Patient Details Name: Joshua Cole MRN: [id number] DOB: [birth year] Today's Date: 05/26/2022   History of Present Illness Patient is a 61 yo male admitted with L prosthetic hip joint infection. S/P removal of hardware + spacer 05/20/22. 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    PT Comments    POD #6 Assisted OOB to amb to bathroom to attempt a "good" BM.  General Gait Details: Assisted with amb to bathroom.  Pt does well to maintain TTWBing LE and uses his gait belt to advance L LE due to pain and effort.  Left pt in bathroom, instructed to pull light when done.   Recommendations for follow up therapy are one component of a multi-disciplinary discharge planning process, led by the attending physician.  Recommendations may be updated based on patient status, additional functional criteria and insurance authorization.  Follow Up Recommendations  Follow physician's recommendations for discharge plan and follow up therapies     Assistance Recommended at Discharge Frequent or constant Supervision/Assistance  Patient can return home with the following Two people to help with walking and/or transfers;Two people to help with bathing/dressing/bathroom;Help with stairs or ramp for entrance;Assistance with cooking/housework;Assist for transportation   Equipment Recommendations  Wheelchair (measurements PT);Wheelchair cushion (measurements PT);BSC/3in1    Recommendations for Other Services       Precautions / Restrictions Precautions Precautions: Anterior Hip Precaution Comments: strict anterior hip precautions + no hip/knee ROM exercises for now. Pt able to recall precautions/restrictions Required Braces or Orthoses: Knee Immobilizer - Left Knee Immobilizer - Left: On when out of bed or walking Restrictions Weight Bearing Restrictions: Yes LLE Weight Bearing: Touchdown weight bearing     Mobility  Bed  Mobility Overal bed mobility: Needs Assistance Bed Mobility: Supine to Sit     Supine to sit: Mod assist     General bed mobility comments: using belt to self asisst L LE off bed required increased time and effort.    Transfers Overall transfer level: Needs assistance Equipment used: Rolling walker (2 wheels) Transfers: Sit to/from Stand Sit to Stand: Min assist           General transfer comment: Min Assist to rise from elevated bed with increased time.    Ambulation/Gait Ambulation/Gait assistance: Min assist, Mod assist Gait Distance (Feet): 12 Feet Assistive device: Rolling walker (2 wheels) Gait Pattern/deviations: Step-to pattern, Decreased stance time - left Gait velocity: decreased     General Gait Details: Assisted with amb to bathroom.  Pt does well to maintain TTWBing LE and uses his gait belt to advance L LE due to pain and effort.   Stairs             Wheelchair Mobility    Modified Rankin (Stroke Patients Only)       Balance                                            Cognition Arousal/Alertness: Awake/alert Behavior During Therapy: WFL for tasks assessed/performed                                   General Comments: AxO x 3 very pleasant and motivated        Exercises      General Comments  Pertinent Vitals/Pain Pain Assessment Pain Assessment: 0-10 Pain Score: 3  Pain Location: L hip Pain Descriptors / Indicators: Guarding, Discomfort, Operative site guarding Pain Intervention(s): Monitored during session, Premedicated before session, Repositioned, Ice applied    Home Living                          Prior Function            PT Goals (current goals can now be found in the care plan section) Progress towards PT goals: Progressing toward goals    Frequency    7X/week      PT Plan Current plan remains appropriate    Co-evaluation              AM-PAC PT  "6 Clicks" Mobility   Outcome Measure  Help needed turning from your back to your side while in a flat bed without using bedrails?: A Lot Help needed moving from lying on your back to sitting on the side of a flat bed without using bedrails?: A Lot Help needed moving to and from a bed to a chair (including a wheelchair)?: A Lot Help needed standing up from a chair using your arms (e.g., wheelchair or bedside chair)?: A Lot Help needed to walk in hospital room?: A Lot Help needed climbing 3-5 steps with a railing? : A Lot 6 Click Score: 12    End of Session Equipment Utilized During Treatment: Gait belt;Left knee immobilizer Activity Tolerance: Patient limited by pain;Patient limited by fatigue Patient left: Other (comment) (in bathroom. Instructed to pull call light when finished.)   PT Visit Diagnosis: Pain;Difficulty in walking, not elsewhere classified (R26.2);Other abnormalities of gait and mobility (R26.89) Pain - Right/Left: Left Pain - part of body: Hip     Time: 7858-8502 PT Time Calculation (min) (ACUTE ONLY): 13 min  Charges:  $Gait Training: 8-22 mins                     Rica Koyanagi  PTA [place] [facility] M-F          [phone] Weekend pager [phone]

## 2022-05-26 NOTE — Plan of Care (Signed)

## 2022-05-26 NOTE — TOC Progression Note (Signed)
Transition of Care [facility]) - Progression Note    Patient Details  Name: Joshua Cole MRN: [id number] Date of Birth: Dec 15, 1960  Transition of Care [facility]) CM/SW Contact  Purcell Mouton, RN Phone Number: 05/26/2022, 1:11 PM  Clinical Narrative:    Pt accepted bed offer at [facility] in [facility].    Expected Discharge Plan: Skilled Nursing Facility Barriers to Discharge: Continued Medical Work up  Expected Discharge Plan and Services Expected Discharge Plan: [place]                                               Social Determinants of Health (SDOH) Interventions    Readmission Risk Interventions     No data to display

## 2022-05-26 NOTE — Progress Notes (Signed)
Physical Therapy Treatment Patient Details Name: Joshua Cole MRN: [id number] DOB: December 12, 1960 Today's Date: 05/26/2022   History of Present Illness Patient is a 61 yo male admitted with L prosthetic hip joint infection. S/P removal of hardware + spacer 05/20/22. 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    PT Comments    POD # 6 Assisted with amb out of bathroom.  General transfer comment: required Max/Total Assist off lower level toilet.  Assisted with peri care due to balance instability at TTWBing.  Unable to "let go" of walker to attempt. General Gait Details: assisted with amb out of bathroom back to bed another 12 feet at TTWBing L LE and using safety belt to advance L LE due to increased pain/effort.General bed mobility comments: assisted back to bed Mod/Max Assist to support L LE. Positioned to comfort and applied ICE.    Recommendations for follow up therapy are one component of a multi-disciplinary discharge planning process, led by the attending physician.  Recommendations may be updated based on patient status, additional functional criteria and insurance authorization.  Follow Up Recommendations  Follow physician's recommendations for discharge plan and follow up therapies     Assistance Recommended at Discharge Frequent or constant Supervision/Assistance  Patient can return home with the following Two people to help with walking and/or transfers;Two people to help with bathing/dressing/bathroom;Help with stairs or ramp for entrance;Assistance with cooking/housework;Assist for transportation   Equipment Recommendations  Wheelchair (measurements PT);Wheelchair cushion (measurements PT);BSC/3in1    Recommendations for Other Services       Precautions / Restrictions Precautions Precautions: Anterior Hip Precaution Comments: strict anterior hip precautions + no hip/knee ROM exercises for now. Pt able to recall  precautions/restrictions Required Braces or Orthoses: Knee Immobilizer - Left Knee Immobilizer - Left: On when out of bed or walking Restrictions Weight Bearing Restrictions: Yes LLE Weight Bearing: Touchdown weight bearing     Mobility  Bed Mobility Overal bed mobility: Needs Assistance Bed Mobility: Sit to Supine     Supine to sit: Mod assist Sit to supine: Mod assist, Max assist   General bed mobility comments: assisted back to bed Mod/Max Assist to support L LE.    Transfers Overall transfer level: Needs assistance Equipment used: Rolling walker (2 wheels) Transfers: Sit to/from Stand Sit to Stand: Max assist, Total assist           General transfer comment: required Max/Total Assist off lower level toilet.  Assisted with peri care due to balance instability at TTWBing.  Unable to "let go" of walker to attempt.    Ambulation/Gait Ambulation/Gait assistance: Min assist, Mod assist Gait Distance (Feet): 12 Feet Assistive device: Rolling walker (2 wheels) Gait Pattern/deviations: Step-to pattern, Decreased stance time - left Gait velocity: decreased     General Gait Details: assisted with amb out of bathroom back to bed another 12 feet at TTWBing L LE and using safety belt to advance L LE due to increased pain/effort.   Stairs             Wheelchair Mobility    Modified Rankin (Stroke Patients Only)       Balance                                            Cognition Arousal/Alertness: Awake/alert Behavior During Therapy: WFL for tasks assessed/performed  General Comments: AxO x 3 very pleasant and motivated        Exercises      General Comments        Pertinent Vitals/Pain Pain Assessment Pain Assessment: 0-10 Pain Score: 3  Pain Location: L hip Pain Descriptors / Indicators: Guarding, Discomfort, Operative site guarding Pain Intervention(s): Monitored during  session, Premedicated before session, Repositioned, Ice applied    Home Living                          Prior Function            PT Goals (current goals can now be found in the care plan section) Progress towards PT goals: Progressing toward goals    Frequency    7X/week      PT Plan Current plan remains appropriate    Co-evaluation              AM-PAC PT "6 Clicks" Mobility   Outcome Measure  Help needed turning from your back to your side while in a flat bed without using bedrails?: A Lot Help needed moving from lying on your back to sitting on the side of a flat bed without using bedrails?: A Lot Help needed moving to and from a bed to a chair (including a wheelchair)?: A Lot Help needed standing up from a chair using your arms (e.g., wheelchair or bedside chair)?: A Lot Help needed to walk in hospital room?: A Lot Help needed climbing 3-5 steps with a railing? : A Lot 6 Click Score: 12    End of Session Equipment Utilized During Treatment: Gait belt;Left knee immobilizer Activity Tolerance: Patient limited by pain;Patient limited by fatigue Patient left: in bed;with call bell/phone within reach;with nursing/sitter in room   PT Visit Diagnosis: Pain;Difficulty in walking, not elsewhere classified (R26.2);Other abnormalities of gait and mobility (R26.89) Pain - Right/Left: Left Pain - part of body: Hip     Time: 5053-9767 PT Time Calculation (min) (ACUTE ONLY): 11 min  Charges:  $Gait Training: 8-22 mins                     {Eriona Kinchen  PTA Acute  [employer] M-F          [phone] Weekend pager [phone]

## 2022-05-26 NOTE — Progress Notes (Signed)
Patient ID: Joshua Cole, male   DOB: [birth year], 61 y.o.   MRN: [id number] The patient is now 6 days status post a left hip excision arthroplasty and placement of a temporary antibiotic spacer.  He is having difficulty with mobility.  It has been recommended that he would benefit from short-term skilled nursing.  Infectious disease has him on 6 weeks of IV antibiotics.  The infection was a sensitive infection.  The patient does not have any financial means and is uninsured so placement has been difficulty.  I appreciate the transitional care team helping determine the patient options.  His left hip incision is clean and dry.  I did aspirate minimal seroma from the surrounding tissue at the bedside this morning.  Clinically he looks good and stable and is medically stable.  Disposition is pending placement.  The patient understands this as well.

## 2022-05-27 LAB — CELL COUNT + DIFF, W/O CRYST-SYNVL FLD
Basophils, %: 0 %
Eosinophils-Synovial: 0 % (ref 0–2)
Lymphocytes-Synovial Fld: 5 % (ref 0–74)
Monocyte/Macrophage: 4 % (ref 0–69)
Neutrophil, Synovial: 91 % — ABNORMAL HIGH (ref 0–24)
Synoviocytes, %: 0 % (ref 0–15)
WBC, Synovial: 270760 cells/uL — ABNORMAL HIGH (ref <150)

## 2022-05-27 LAB — FUNGUS CULTURE W SMEAR
CULTURE:: NO GROWTH
MICRO NUMBER:: 13799891
SMEAR:: NONE SEEN
SPECIMEN QUALITY:: ADEQUATE

## 2022-05-27 LAB — ANAEROBIC AND AEROBIC CULTURE
MICRO NUMBER:: 13799889
MICRO NUMBER:: 13799890
SPECIMEN QUALITY:: ADEQUATE
SPECIMEN QUALITY:: ADEQUATE

## 2022-05-27 LAB — GLUCOSE, CAPILLARY
Glucose-Capillary: 164 mg/dL — ABNORMAL HIGH (ref 70–99)
Glucose-Capillary: 128 mg/dL — ABNORMAL HIGH (ref 70–99)
Glucose-Capillary: 174 mg/dL — ABNORMAL HIGH (ref 70–99)

## 2022-05-27 MED ORDER — ASPIRIN 81 MG PO CHEW: 81.0000 mg | CHEWABLE_TABLET | Freq: Two times a day (BID) | ORAL | 0 refills | Status: DC

## 2022-05-27 MED ORDER — CEFAZOLIN IV (FOR PTA / DISCHARGE USE ONLY): 2.0000 g | Freq: Three times a day (TID) | INTRAVENOUS | 0 refills | Status: DC

## 2022-05-27 MED ORDER — OXYCODONE HCL 5 MG PO TABS: 5.0000 mg | ORAL_TABLET | ORAL | 0 refills | Status: DC | PRN

## 2022-05-27 MED ORDER — HEPARIN SOD (PORK) LOCK FLUSH 100 UNIT/ML IV SOLN
250.0000 [IU] | INTRAVENOUS | Status: AC | PRN
  Administered 2022-05-27: 250 [IU]
  Filled 2022-05-27: qty 2.5

## 2022-05-27 MED ORDER — CEFAZOLIN SODIUM-DEXTROSE 2-4 GM/100ML-% IV SOLN: 2.0000 g | Freq: Three times a day (TID) | INTRAVENOUS | 0 refills | Status: AC

## 2022-05-27 NOTE — TOC Progression Note (Signed)
Transition of Care [facility]) - Progression Note    Patient Details  Name: Miroslav Gin MRN: [id number] Date of Birth: [birth year]  Transition of Care [facility]) CM/SW Contact  Purcell Mouton, RN Phone Number: 05/27/2022, 3:27 PM  Clinical Narrative:     [name] was called. Pt will go to [facility] in [place]. Pt is aware and selected [place].   Expected Discharge Plan: Skilled Nursing Facility Barriers to Discharge: Continued Medical Work up  Expected Discharge Plan and Services Expected Discharge Plan: [place]         Expected Discharge Date: 05/27/22                                     Social Determinants of Health (SDOH) Interventions    Readmission Risk Interventions     No data to display

## 2022-05-27 NOTE — Discharge Summary (Signed)
Patient ID: Joshua Cole MRN: [id number] DOB/AGE: 1960-09-21 61 y.o.  Admit date: 05/20/2022 Discharge date: 05/27/2022  Admission Diagnoses:  Principal Problem:   Left hip prosthetic joint infection [facility])   Discharge Diagnoses:  Same  Past Medical History:  Diagnosis Date   Arthritis    Asthma    Chronic kidney disease    Diabetes mellitus without complication ([place])    type 2   DJD (degenerative joint disease)    Dysrhythmia    junctional tachycardia and incomplete heart block   Gout    Heart block    Lung nodules    a. 09/2021 CT chest: Interval decrease in size and number of bilateral lung nodules, likely consistent with sequelae associated with an infectious/inflammatory process.   MSSA bacteremia 06/2021   Rotator cuff tear 08/26/2021   Subacute bacterial endocarditis (SBE)    a. 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    Surgeries: Procedure(s): EXCISIONAL LEFT TOTAL HIP ARTHROPLASTY WITH ANTIBIOTIC SPACERS on 05/20/2022   Consultants:   Discharged Condition: Improved  Hospital Course: GMylin Hiranois an 61y.o. male who was admitted 05/20/2022 for operative treatment ofLeft hip prosthetic joint infection ([place]. Patient has severe unremitting pain that affects sleep, daily activities, and work/hobbies. After pre-op clearance the patient was taken to the operating room on 05/20/2022 and underwent  Procedure(s): EXCISIONAL LEFT TOTAL HIP ARTHROPLASTY WITH ANTIBIOTIC SPACERS.    Patient was given perioperative antibiotics:  Anti-infectives (From admission, onward)    Start     Dose/Rate Route Frequency Ordered Stop   05/27/22 0000  ceFAZolin (ANCEF) IVPB        2 g Intravenous Every 8 hours 05/27/22 0717     05/27/22 0000  ceFAZolin (ANCEF) 2-4 GM/100ML-% IVPB        2 g Intravenous Every 8 hours  05/27/22 0717 07/06/22 2359   05/22/22 1400  ceFAZolin (ANCEF) IVPB 2g/100 mL premix        2 g 200 mL/hr over 30 Minutes Intravenous Every 8 hours 05/22/22 1102     05/20/22 2000  ceFAZolin (ANCEF) IVPB 2g/100 mL premix  Status:  Discontinued        2 g 200 mL/hr over 30 Minutes Intravenous Every 8 hours 05/20/22 1920 05/22/22 1102   05/20/22 1505  vancomycin (VANCOCIN) powder  Status:  Discontinued          As needed 05/20/22 1505 05/20/22 1847   05/20/22 1437  vancomycin (VANCOCIN) powder  Status:  Discontinued          As needed 05/20/22 1438 05/20/22 1847        Patient was given sequential compression devices, early ambulation, and chemoprophylaxis to prevent DVT.  Patient benefited maximally from hospital stay and there were no complications.  A PICC line was placed during the hospitalization and the infectious disease service was consulted.  The patient's infection is treatable with Ancef every 8 hours IV for the next 6 weeks.  Recent vital signs: Patient Vitals for the past 24 hrs:  BP Temp Temp src Pulse Resp SpO2  05/27/22 0625 122/72 97.8 F (36.6 C) Oral 85 18 96 %  05/26/22 2209 127/63 99.1 F (37.3 C) Oral 94 18 96 %  05/26/22 1339 (!) 145/74 98 F (36.7 C) Oral (!) 106 20 100 %  05/26/22 0843 119/72 98.2 F (36.8  C) Oral 93 17 97 %     Recent laboratory studies: No results for input(s): "WBC", "HGB", "HCT", "PLT", "NA", "K", "CL", "CO2", "BUN", "CREATININE", "GLUCOSE", "INR", "CALCIUM" in the last 72 hours.  Invalid input(s): "PT", "2"   Discharge Medications:   Allergies as of 05/27/2022   No Known Allergies      Medication List     STOP taking these medications    meloxicam 15 MG tablet Commonly known as: MOBIC   traMADol 50 MG tablet Commonly known as: ULTRAM       TAKE these medications    acetaminophen 500 MG tablet Commonly known as: TYLENOL Take 500 mg by mouth every 4 (four) hours as needed for mild pain.   albuterol 108 (90 Base)  MCG/ACT inhaler Commonly known as: VENTOLIN HFA Inhale 1-2 puffs into the lungs every 6 (six) hours as needed for wheezing or shortness of breath.   aspirin 81 MG chewable tablet Chew 1 tablet (81 mg total) by mouth 2 (two) times daily.   Basaglar KwikPen 100 UNIT/ML Inject 15 Units into the skin once daily at bedtime.   ceFAZolin  IVPB Commonly known as: ANCEF Inject 2 g into the vein every 8 (eight) hours. Indication:  septic arthritis First Dose: Yes Last Day of Therapy:  06/30/2022 Labs - Once weekly:  CBC/D and BMP, Labs - Every other week:  ESR and CRP Method of administration: IV Push Method of administration may be changed at the discretion of home infusion pharmacist based upon assessment of the patient and/or caregiver's ability to self-administer the medication ordered.   ceFAZolin 2-4 GM/100ML-% IVPB Commonly known as: ANCEF Inject 100 mLs (2 g total) into the vein every 8 (eight) hours.   diclofenac Sodium 1 % Gel Commonly known as: VOLTAREN Apply 2 g topically 2 (two) times daily as needed (pain).   furosemide 20 MG tablet Commonly known as: LASIX Take 1 tablet (20 mg total) by mouth once daily as needed (swelling or weight gain).   gabapentin 300 MG capsule Commonly known as: NEURONTIN Take 1 capsule (300 mg total) by mouth 2 (two) times daily.   ketotifen 0.025 % ophthalmic solution Commonly known as: ZADITOR Place 1 drop into both eyes daily as needed (allergies).   loratadine 10 MG tablet Commonly known as: CLARITIN Take 10 mg by mouth daily as needed for allergies.   metFORMIN 1000 MG tablet Commonly known as: GLUCOPHAGE Take 0.5 tablets (500 mg total) by mouth 2 (two) times daily with a meal.   oxyCODONE 5 MG immediate release tablet Commonly known as: Oxy IR/ROXICODONE Take 1-2 tablets (5-10 mg total) by mouth every 4 (four) hours as needed for moderate pain (pain score 4-6).   polyvinyl alcohol 1.4 % ophthalmic solution Commonly known as:  LIQUIFILM TEARS Place 1 drop into both eyes as needed for dry eyes.   Rightest GL300 Lancets Misc USE AS DIRECTED TO CHECK BLOOD SUGAR UP TO 4 TIMES DAILY.   Rightest GS550 Blood Glucose test strip Generic drug: glucose blood USE AS DIRECTED TO CHECK BLOOD SUGAR UP TO 4 TIMES DAILY.   Unifine Pentips 32G X 4 MM Misc Generic drug: Insulin Pen Needle USE AS DIRECTED               Durable Medical Equipment  (From admission, onward)           Start     Ordered   05/22/22 1703  For home use only DME Walker rolling  Once  Question Answer Comment  Walker: With Downs   Patient needs a walker to treat with the following condition Difficulty walking      05/22/22 1702   05/22/22 1656  For home use only DME lightweight manual wheelchair with seat cushion  Once       Comments: Patient suffers from L prosthetic joint infection s/p hardware removal and placement of spacer (requiring limited WB status-touch down weight bearing-and need for knee immobilizer) which impairs their ability to perform daily activities like walking, bathing, grooming, and toileting in the home.  A walker will not resolve issue with performing activities of daily living. A wheelchair will allow patient to safely perform daily activities. Patient can self propel in the lightweight wheelchair.  Accessories: elevating leg rests (ELRs), wheel locks, extensions and anti-tippers.   05/22/22 1701              Discharge Care Instructions  (From admission, onward)           Start     Ordered   05/27/22 0000  Change dressing on IV access line weekly and PRN  (Home infusion instructions - [facility] )        05/27/22 0717            Diagnostic Studies: DG HIP PORT UNILAT WITH PELVIS 1V LEFT  Result Date: 05/23/2022 CLINICAL DATA:  Left hip arthroplasty infection status post removal and antibiotic spacer placement. EXAM: DG HIP (WITH OR WITHOUT PELVIS) 1V PORT LEFT COMPARISON:   Pelvis x-ray dated May 20, 2022. FINDINGS: Left hip antibiotic spacer remains in place. Unchanged comminuted fracture involving the greater trochanter and lateral cortex of the intertrochanteric femur. No dislocation. Small amount of residual subcutaneous emphysema in the lateral hip. IMPRESSION: 1. Left hip antibiotic spacer remains in place with unchanged appearance of the lateral proximal femur fracture. Electronically Signed   By: Titus Dubin M.D.   On: 05/23/2022 08:49   DG Pelvis Portable  Result Date: 05/20/2022 CLINICAL DATA:  Revision of left hip arthroplasty EXAM: PORTABLE PELVIS 1-2 VIEWS COMPARISON:  03/16/2022 FINDINGS: There is interval revision of left hip arthroplasty. There is a new comminuted fracture in the lateral aspect of junction of greater trochanter and proximal shaft of left femur. There is previous right hip arthroplasty. IMPRESSION: There is interval revision of left hip arthroplasty. There is a new comminuted fracture in the lateral aspect of proximal left femur which may be related to recent surgery or suggest recent trauma. These results will be called to the ordering clinician or representative by the Radiologist Assistant, and communication documented in the PACS or [employer]. Electronically Signed   By: Elmer Picker M.D.   On: 05/20/2022 17:24   DG HIP UNILAT WITH PELVIS 1V LEFT  Result Date: 05/20/2022 CLINICAL DATA:  Left hip arthroplasty. EXAM: DG HIP (WITH OR WITHOUT PELVIS) 1V*L* COMPARISON:  03/16/2022 FINDINGS: Single spot fluoroscopic image over the left hip demonstrates left hip arthroplasty in adequate position. Recommend correlation with findings at the time of the procedure. IMPRESSION: Left hip arthroplasty in adequate position. Electronically Signed   By: Marin Olp M.D.   On: 05/20/2022 15:27   DG C-Arm 1-60 Min-No Report  Result Date: 05/20/2022 Fluoroscopy was utilized by the requesting physician.  No radiographic interpretation.    DG C-Arm 1-60 Min-No Report  Result Date: 05/20/2022 Fluoroscopy was utilized by the requesting physician.  No radiographic interpretation.   DG C-Arm 1-60 Min-No Report  Result Date:  05/20/2022 Fluoroscopy was utilized by the requesting physician.  No radiographic interpretation.   [place] EKG SITE RITE  Result Date: 05/20/2022 If Site Rite image not attached, placement could not be confirmed due to current cardiac rhythm.  DG FLUORO GUIDED NEEDLE PLC ASPIRATION/INJECTION LOC  Result Date: 04/29/2022 CLINICAL DATA:  Left hip pain. History of arthroplasty fifteen years ago. Abnormal uptake around the arthroplasty on recent bone scan. History of MSSA bacteremia last October. FLUOROSCOPY TIME:  Radiation Exposure Index (as provided by the fluoroscopic device): 6.5 mGy Kerma PROCEDURE: The risks and benefits of the procedure were discussed with the patient, and written informed consent was obtained. The patient stated no history of allergy to contrast media. A formal timeout procedure was performed with the patient according to departmental protocol. The patient was placed supine on the fluoroscopy table and the left hip arthroplasty was identified under fluoroscopy. The skin overlying the left hip arthroplasty was subsequently cleaned with Betadine and a sterile drape was placed over the area of interest. 2 ml 1% Lidocaine was used to anesthetize the skin around the needle insertion site. An 18 gauge spinal needle was inserted into the left hip joint under fluoroscopy. Initial aspiration yielded no fluid despite repositioning the needle several times. Diagnostic injection of 1 ml Isovue-M 200 iodinated contrast demonstrates intra-articular spread without intravascular component. 10 mL of sterile saline were then injected into the left hip joint. Aspiration yielded 4 mL of bloody fluid, which was sent for laboratory analysis. The needle was removed and hemostasis was achieved. There were no immediate  complications. IMPRESSION: Technically successful left hip arthroplasty aspiration. Electronically Signed   By: Titus Dubin M.D.   On: 04/29/2022 16:15    Disposition: Discharge disposition: 03-Skilled [place]       Discharge Instructions     [facility] pharmacist to adjust dose for Vancomycin, Aminoglycosides and other anti-infective therapies as requested by physician.   Complete by: As directed    [facility] to provide Cath Flo 86m   Complete by: As directed    Administer for PICC line occlusion and as ordered by physician for other access device issues.   Anaphylaxis Kit: Provided to treat any anaphylactic reaction to the medication being provided to the patient if First Dose or when requested by physician   Complete by: As directed    Epinephrine 161mml vial / amp: Administer 0.46m61m0.46ml446mubcutaneously once for moderate to severe anaphylaxis, nurse to call physician and pharmacy when reaction occurs and call 911 if needed for immediate care   Diphenhydramine 50mg19mIV vial: Administer 25-50mg 46mM PRN for first dose reaction, rash, itching, mild reaction, nurse to call physician and pharmacy when reaction occurs   Sodium Chloride 0.9% NS 500ml I34mdminister if needed for hypovolemic blood pressure drop or as ordered by physician after call to physician with anaphylactic reaction   Change dressing on IV access line weekly and PRN   Complete by: As directed    Flush IV access with Sodium Chloride 0.9% and Heparin 10 units/ml or 100 units/ml   Complete by: As directed    Home infusion instructions - [facility]   Complete by: As directed    Instructions: Flush IV access with Sodium Chloride 0.9% and Heparin 10units/ml or 100units/ml   Change dressing on IV access line: Weekly and PRN   Instructions Cath Flo 2mg: Ad67mister for PICC Line occlusion and as ordered by physician for other access device   [facility] pharmacist  to adjust dose for: Vancomycin, Aminoglycosides and other anti-infective therapies as requested by physician   Method of administration may be changed at the discretion of home infusion pharmacist based upon assessment of the patient and/or caregiver's ability to self-administer the medication ordered   Complete by: As directed           Signed: Mcarthur Rossetti 05/27/2022, 7:18 AM

## 2022-05-27 NOTE — Discharge Instructions (Signed)
The patient is only to be touchdown weightbearing on the his left lower extremity. Limit left hip flexion and no left hip abduction. Change left hip dressing with new dry dressing as needed.

## 2022-05-27 NOTE — Progress Notes (Signed)
Patient ID: Joshua Cole, male   DOB: Dec 16, 1960, 62 y.o.   MRN: [id number] The patient is stable medically and from an orthopedic standpoint.  Upon review the notes, a skilled nursing facility has accepted him in [place].  He can be discharged today.

## 2022-05-27 NOTE — Plan of Care (Signed)
Problem: Clinical Measurements: Goal: Ability to maintain clinical measurements within normal limits will improve Outcome: Progressing   Problem: Activity: Goal: Risk for activity intolerance will decrease Outcome: Progressing   Problem: Pain Managment: Goal: General experience of comfort will improve Outcome: Progressing   Ivan Anchors, RN. 05/27/22 11:05 AM

## 2022-05-27 NOTE — TOC Progression Note (Signed)
Transition of Care [facility]) - Progression Note    Patient Details  Name: Joshua Cole MRN: [id number] Date of Birth: 12/11/1960  [facility] [place] CM/SW Contact  Purcell Mouton, RN Phone Number: 05/27/2022, 11:38 AM  Clinical Narrative:     [name] was called.   Expected Discharge Plan: Skilled Nursing Facility Barriers to Discharge: Continued Medical Work up  Expected Discharge Plan and Services Expected Discharge Plan: [place]         Expected Discharge Date: 05/27/22                                     Social Determinants of Health (SDOH) Interventions    Readmission Risk Interventions     No data to display

## 2022-05-27 NOTE — Plan of Care (Signed)
  Problem: Health Behavior/Discharge Planning: Goal: Ability to manage health-related needs will improve Outcome: Progressing   Problem: Clinical Measurements: Goal: Ability to maintain clinical measurements within normal limits will improve Outcome: Progressing Goal: Will remain free from infection Outcome: Progressing Goal: Diagnostic test results will improve Outcome: Progressing Goal: Respiratory complications will improve Outcome: Progressing Goal: Cardiovascular complication will be avoided Outcome: Progressing   Problem: Activity: Goal: Risk for activity intolerance will decrease Outcome: Progressing   Problem: Coping: Goal: Level of anxiety will decrease Outcome: Progressing   Problem: Elimination: Goal: Will not experience complications related to bowel motility Outcome: Progressing   Problem: Pain Managment: Goal: General experience of comfort will improve Outcome: Progressing   Problem: Safety: Goal: Ability to remain free from injury will improve Outcome: Progressing   Problem: Skin Integrity: Goal: Risk for impaired skin integrity will decrease Outcome: Progressing   Problem: Coping: Goal: Ability to adjust to condition or change in health will improve Outcome: Progressing   Problem: Health Behavior/Discharge Planning: Goal: Ability to identify and utilize available resources and services will improve Outcome: Progressing Goal: Ability to manage health-related needs will improve Outcome: Progressing   Problem: Metabolic: Goal: Ability to maintain appropriate glucose levels will improve Outcome: Progressing   Problem: Nutritional: Goal: Progress toward achieving an optimal weight will improve Outcome: Progressing   Problem: Skin Integrity: Goal: Risk for impaired skin integrity will decrease Outcome: Progressing   Problem: Tissue Perfusion: Goal: Adequacy of tissue perfusion will improve Outcome: Progressing   Problem: Education: Goal:  Knowledge of the prescribed therapeutic regimen will improve Outcome: Progressing Goal: Understanding of discharge needs will improve Outcome: Progressing   Problem: Activity: Goal: Ability to avoid complications of mobility impairment will improve Outcome: Progressing Goal: Ability to tolerate increased activity will improve Outcome: Progressing   Problem: Clinical Measurements: Goal: Postoperative complications will be avoided or minimized Outcome: Progressing   Problem: Pain Management: Goal: Pain level will decrease with appropriate interventions Outcome: Progressing   Problem: Skin Integrity: Goal: Will show signs of wound healing Outcome: Progressing

## 2022-05-31 ENCOUNTER — Other Ambulatory Visit: Payer: Self-pay | Admitting: Orthopaedic Surgery

## 2022-06-01 DIAGNOSIS — E785 Hyperlipidemia, unspecified: Secondary | ICD-10-CM | POA: Diagnosis not present

## 2022-06-01 DIAGNOSIS — E119 Type 2 diabetes mellitus without complications: Secondary | ICD-10-CM | POA: Diagnosis not present

## 2022-06-01 DIAGNOSIS — S71002D Unspecified open wound, left hip, subsequent encounter: Secondary | ICD-10-CM | POA: Diagnosis not present

## 2022-06-01 DIAGNOSIS — N189 Chronic kidney disease, unspecified: Secondary | ICD-10-CM | POA: Diagnosis not present

## 2022-06-01 DIAGNOSIS — T8452XD Infection and inflammatory reaction due to internal left hip prosthesis, subsequent encounter: Secondary | ICD-10-CM | POA: Diagnosis not present

## 2022-06-01 DIAGNOSIS — M1380 Other specified arthritis, unspecified site: Secondary | ICD-10-CM | POA: Diagnosis not present

## 2022-06-02 DIAGNOSIS — E1122 Type 2 diabetes mellitus with diabetic chronic kidney disease: Secondary | ICD-10-CM | POA: Diagnosis not present

## 2022-06-02 DIAGNOSIS — J45909 Unspecified asthma, uncomplicated: Secondary | ICD-10-CM | POA: Diagnosis not present

## 2022-06-02 DIAGNOSIS — Z4789 Encounter for other orthopedic aftercare: Secondary | ICD-10-CM | POA: Diagnosis not present

## 2022-06-02 DIAGNOSIS — T8452XD Infection and inflammatory reaction due to internal left hip prosthesis, subsequent encounter: Secondary | ICD-10-CM | POA: Diagnosis not present

## 2022-06-07 ENCOUNTER — Telehealth: Payer: Self-pay | Admitting: Orthopaedic Surgery

## 2022-06-07 ENCOUNTER — Other Ambulatory Visit: Payer: Self-pay | Admitting: Orthopaedic Surgery

## 2022-06-07 NOTE — Telephone Encounter (Signed)
Please advise 

## 2022-06-07 NOTE — Telephone Encounter (Signed)
Rhonda with [facility] called and is wondering when pt needs to get stitches out? Does he need to follow up with [place] or infectious disease. She states pt was sent with no instructions or upcoming appt.   Cb [phone]

## 2022-06-08 ENCOUNTER — Telehealth: Payer: Self-pay | Admitting: Orthopaedic Surgery

## 2022-06-08 NOTE — Telephone Encounter (Signed)
Called back. Scheduled pt for tomorrow @ 1

## 2022-06-08 NOTE — Telephone Encounter (Signed)
Joshua Cole [facility]  [phone] called about appt for Joshua Cole tomorrow at 1pm she stated she talked to Joshua Cole or Joshua Cole she was not sure or not sure for which Dr.

## 2022-06-08 NOTE — Telephone Encounter (Signed)
Called and advised he needs to be seen. She has to see if they can bring him in tomorrow. Ok to work in @ [employer] with [employer]

## 2022-06-09 ENCOUNTER — Ambulatory Visit (INDEPENDENT_AMBULATORY_CARE_PROVIDER_SITE_OTHER): Payer: Self-pay | Admitting: Physician Assistant

## 2022-06-09 ENCOUNTER — Encounter: Payer: Self-pay | Admitting: Physician Assistant

## 2022-06-09 DIAGNOSIS — Z96649 Presence of unspecified artificial hip joint: Secondary | ICD-10-CM

## 2022-06-09 DIAGNOSIS — T8459XS Infection and inflammatory reaction due to other internal joint prosthesis, sequela: Secondary | ICD-10-CM

## 2022-06-09 DIAGNOSIS — T8452XD Infection and inflammatory reaction due to internal left hip prosthesis, subsequent encounter: Secondary | ICD-10-CM | POA: Diagnosis not present

## 2022-06-09 NOTE — Progress Notes (Signed)
Office Visit Note   Patient: Joshua Cole           Date of Birth: [birth year]           MRN: [id number] Visit Date: 06/09/2022              Requested by: No referring provider defined for this encounter. PCP: Pcp, No   Assessment & Plan: Visit Diagnoses:  1. Prosthetic hip infection, sequela     Plan: He will follow-up with [place] in 4 weeks.  He has an appointment with infectious disease on 06/20/2022.  Continue his Ancef until that time.  In regards to his left lower extremities nonweightbearing.  Continue knee immobilizer he can remove this for bathing.  6 tables were removed today Steri-Strips applied.  He is able to get the incision wet in the shower.  Questions were encouraged and answered at length.  Attempt of aspiration yielded 5 cc old blood consistent with a hematoma.  Follow-Up Instructions: No follow-ups on file.   Orders:  No orders of the defined types were placed in this encounter.  No orders of the defined types were placed in this encounter.     Procedures: No procedures performed   Clinical Data: No additional findings.   Subjective: Chief Complaint  Patient presents with   Left Hip - Routine Post Op    HPI [name] returns today status post excision left total hip arthroplasty on.  Irrigation debridement and placement of a temporary antibiotic spacer left hip due to hip infection.  Surgery was performed on 05/20/2022.  He is here today for follow-up.  He is at a skilled facility.  He has been nonweightbearing on the left leg.  He is wearing knee immobilizer at all times.  He states overall he is doing well.  Has no complaints.  He is on aspirin for DVT prophylaxis.  He is on Ancef and is due to follow-up with infectious disease in early October.  Review of Systems See HPI  Objective: Vital Signs: There were no vitals taken for this visit.  Physical Exam General: Well-developed well-nourished male no acute distress mood and affect appropriate.   Psych alert and oriented x3 Ortho Exam Left hip incisions well approximated staples no signs of infection.  No dehiscence.  Dorsiflexion plantarflexion left ankle intact. Specialty Comments:  No specialty comments available.  Imaging: No results found.   PMFS History: Patient Active Problem List   Diagnosis Date Noted   Left hip prosthetic joint infection (Joshua Cole) 05/19/2022   Acid reflux 03/31/2022   Left hip pain 03/31/2022   Complete tear of left rotator cuff 02/10/2022   Headache 11/02/2021   Peripheral neuropathy 10/05/2021   Acute gout 08/26/2021   Rotator cuff tear 08/26/2021   Encounter to establish care 08/25/2021   Type 2 diabetes mellitus without complications (Joshua Cole) 22/29/7989   History of gout 08/25/2021   Left shoulder pain 08/25/2021   Duodenal ulcer    History of colonic polyps    Acute blood loss anemia    Heart block AV complete (HCC)    Abnormal LFTs    MSSA bacteremia 07/08/2021   Acute bacterial endocarditis    Heart block    Bacteremia    Hyponatremia 21/19/4174   Acute metabolic encephalopathy 04/25/4817   Joint pain 07/03/2021   Generalized weakness 07/03/2021   SIRS (systemic inflammatory response syndrome) (Joshua Cole) 07/03/2021   Hypokalemia 07/03/2021   Hyperglycemia 07/03/2021   Thrombocytopenia (Joshua Cole) 07/03/2021   Nausea vomiting and  diarrhea 07/03/2021   Gout    Past Medical History:  Diagnosis Date   Arthritis    Asthma    Chronic kidney disease    Diabetes mellitus without complication (HCC)    type 2   DJD (degenerative joint disease)    Dysrhythmia    junctional tachycardia and incomplete heart block   Gout    Heart block    Lung nodules    a. 09/2021 CT chest: Interval decrease in size and number of bilateral lung nodules, likely consistent with sequelae associated with an infectious/inflammatory process.   MSSA bacteremia 06/2021   Rotator cuff tear 08/26/2021   Subacute bacterial endocarditis (SBE)    a. 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    Family History  Problem Relation Age of Onset   Other Mother        unknown medical history   Lung cancer Father    Psoriasis Sister    Breast cancer Sister    Hypertension Brother    Hyperlipidemia Brother    Diabetes Brother    Heart attack Brother 574   Past Surgical History:  Procedure Laterality Date   ACHILLES TENDON REPAIR Left    many years ago per pt   BIOPSY  07/16/2021   Procedure: BIOPSY;  Surgeon: Joshua Cole;  [facility]  Service: Gastroenterology;;  EGD and COLON   COLONOSCOPY N/A 07/16/2021   Procedure: COLONOSCOPY;  Surgeon: Joshua Cole;  [place]  Service: Gastroenterology;  Laterality: N/A;   COLONOSCOPY WITH PROPOFOL N/A 07/16/2021   Procedure: COLONOSCOPY WITH PROPOFOL;  Surgeon: Joshua Cole;  [place]  Service: Gastroenterology;  Laterality: N/A;   ESOPHAGOGASTRODUODENOSCOPY (EGD) WITH PROPOFOL N/A 07/16/2021   Procedure: ESOPHAGOGASTRODUODENOSCOPY (EGD) WITH PROPOFOL;  Surgeon: Joshua Cole;  [place]  Service: Gastroenterology;  Laterality: N/A;   EXCISIONAL TOTAL HIP ARTHROPLASTY WITH ANTIBIOTIC SPACERS Left 05/20/2022   Procedure: EXCISIONAL LEFT TOTAL HIP ARTHROPLASTY WITH ANTIBIOTIC SPACERS;  Surgeon: Joshua Cole;  [facility];  Service: Orthopedics;  Laterality: Left;   FOOT SURGERY Right    ligaments repaired- many years ago per pt   HERNIA REPAIR     JOINT REPLACEMENT Bilateral    hip   POLYPECTOMY  07/16/2021   Procedure: POLYPECTOMY;  Surgeon: Joshua Cole;  [facility]  Service: Gastroenterology;;   SHOULDER ARTHROSCOPY WITH ROTATOR CUFF REPAIR AND SUBACROMIAL DECOMPRESSION Left 02/10/2022   Procedure: LEFT SHOULDER ARTHROSCOPY WITH EXTENSIVE  DEBRIDEMENT, SUBACROMIAL DECOMPRESSION;  Surgeon: Joshua Cole;  [place]  Service: Orthopedics;  Laterality: Left;   TEE WITHOUT CARDIOVERSION N/A 07/07/2021   Procedure: TRANSESOPHAGEAL ECHOCARDIOGRAM (TEE);  Surgeon: AKate Sable Cole;  Location: [facility];  Service: Cardiovascular;  Laterality: N/A;   Social History   Occupational History   Not on file  Tobacco Use   Smoking status: Former    Packs/day: 0.25    Years: 15.00    Total pack years: 3.75    Types: Cigarettes    Quit date: 2007    Years since quitting: 16.7   Smokeless tobacco: Never  Vaping Use   Vaping Use: Never used  Substance and Sexual Activity   Alcohol use: Not Currently    Alcohol/week: 6.0 standard drinks of alcohol  Types: 6 Glasses of wine per week    Comment: couple of glasses of wine on the weekends   Drug use: Not Currently   Sexual activity: Not on file

## 2022-06-10 ENCOUNTER — Other Ambulatory Visit: Payer: Self-pay

## 2022-06-15 ENCOUNTER — Other Ambulatory Visit: Payer: Self-pay | Admitting: Orthopaedic Surgery

## 2022-06-17 DIAGNOSIS — E119 Type 2 diabetes mellitus without complications: Secondary | ICD-10-CM | POA: Diagnosis not present

## 2022-06-17 DIAGNOSIS — T8452XD Infection and inflammatory reaction due to internal left hip prosthesis, subsequent encounter: Secondary | ICD-10-CM | POA: Diagnosis not present

## 2022-06-20 ENCOUNTER — Ambulatory Visit: Payer: Self-pay | Admitting: Infectious Disease

## 2022-06-22 ENCOUNTER — Other Ambulatory Visit: Payer: Self-pay | Admitting: Orthopaedic Surgery

## 2022-06-23 DIAGNOSIS — Z4789 Encounter for other orthopedic aftercare: Secondary | ICD-10-CM | POA: Diagnosis not present

## 2022-06-23 DIAGNOSIS — U071 COVID-19: Secondary | ICD-10-CM | POA: Diagnosis not present

## 2022-06-23 DIAGNOSIS — T8452XD Infection and inflammatory reaction due to internal left hip prosthesis, subsequent encounter: Secondary | ICD-10-CM | POA: Diagnosis not present

## 2022-06-23 DIAGNOSIS — E1151 Type 2 diabetes mellitus with diabetic peripheral angiopathy without gangrene: Secondary | ICD-10-CM | POA: Diagnosis not present

## 2022-06-23 DIAGNOSIS — E1122 Type 2 diabetes mellitus with diabetic chronic kidney disease: Secondary | ICD-10-CM | POA: Diagnosis not present

## 2022-06-28 ENCOUNTER — Other Ambulatory Visit: Payer: Self-pay | Admitting: Orthopaedic Surgery

## 2022-06-29 ENCOUNTER — Telehealth: Payer: Self-pay

## 2022-06-29 NOTE — Telephone Encounter (Signed)
[place] to relay pull PICC orders, no answer. Left voicemail requesting call back from nursing staff.   Beryle Flock, RN

## 2022-06-29 NOTE — Telephone Encounter (Signed)
Received call from [place] at [facility] wanting to know if PICC line can be pulled after last dose tomorrow 10/19. Will route to provider.   [phone], ask to speak with Lenna Sciara or Casimer Leek.   Beryle Flock, RN

## 2022-06-30 ENCOUNTER — Ambulatory Visit: Payer: Self-pay | Admitting: Gerontology

## 2022-06-30 NOTE — Telephone Encounter (Signed)
Verbal orders to Pull PICC today 10/19 after last dose of antibiotics given to Lenna Sciara, RN at [facility] repeated orders back to me and verbalized her understanding.    [place], CMA

## 2022-07-01 DIAGNOSIS — Z4789 Encounter for other orthopedic aftercare: Secondary | ICD-10-CM | POA: Diagnosis not present

## 2022-07-01 DIAGNOSIS — E1151 Type 2 diabetes mellitus with diabetic peripheral angiopathy without gangrene: Secondary | ICD-10-CM | POA: Diagnosis not present

## 2022-07-01 DIAGNOSIS — E1122 Type 2 diabetes mellitus with diabetic chronic kidney disease: Secondary | ICD-10-CM | POA: Diagnosis not present

## 2022-07-01 DIAGNOSIS — T8452XD Infection and inflammatory reaction due to internal left hip prosthesis, subsequent encounter: Secondary | ICD-10-CM | POA: Diagnosis not present

## 2022-07-04 ENCOUNTER — Encounter: Payer: Self-pay | Admitting: Infectious Disease

## 2022-07-04 ENCOUNTER — Other Ambulatory Visit: Payer: Self-pay

## 2022-07-04 ENCOUNTER — Ambulatory Visit (INDEPENDENT_AMBULATORY_CARE_PROVIDER_SITE_OTHER): Payer: Medicaid Other | Admitting: Infectious Disease

## 2022-07-04 VITALS — BP 115/75 | HR 95 | Temp 98.0°F

## 2022-07-04 DIAGNOSIS — E119 Type 2 diabetes mellitus without complications: Secondary | ICD-10-CM | POA: Diagnosis not present

## 2022-07-04 DIAGNOSIS — T8452XD Infection and inflammatory reaction due to internal left hip prosthesis, subsequent encounter: Secondary | ICD-10-CM

## 2022-07-04 DIAGNOSIS — Z794 Long term (current) use of insulin: Secondary | ICD-10-CM | POA: Diagnosis not present

## 2022-07-04 DIAGNOSIS — B9561 Methicillin susceptible Staphylococcus aureus infection as the cause of diseases classified elsewhere: Secondary | ICD-10-CM | POA: Diagnosis not present

## 2022-07-04 DIAGNOSIS — R7881 Bacteremia: Secondary | ICD-10-CM

## 2022-07-04 DIAGNOSIS — R600 Localized edema: Secondary | ICD-10-CM

## 2022-07-04 HISTORY — DX: Localized edema: R60.0

## 2022-07-04 NOTE — Progress Notes (Signed)
Subjective:  Chief complaint follow-up for prosthetic hip infection, and about left lower extremity edema:    Patient ID: Joshua Cole, male    DOB: 1961-07-06, 61 y.o.   MRN: [id number]  HPI  male who I saw last year after he was discharged from the hospital having been diagnosed with MSSA bacteremia and tricuspid valve endocarditis.   He had been considered for valve replacement but was felt not to be a good operative candidate.  Fortunately his blood cultures cleared and he completed a course of IV cefazolin.   He was having multiple joint pains which at the time were being attributed to gout.  He had a shoulder pain in particular and had an MRI of the shoulder that showed a full-thickness tear of the supraspinatus tendon and high-grade articular tearing of the anterior fibers supraspinatus tendon is this mild subscapularis tendinosis with distention of the subacromial subdeltoid bursa with peripheral enhancement of the area that could be blood versus potential infection.  Time radiology felt this more likely was due to blood.   He ultimately has had shoulder surgery.   He also in the intermittent developed pain at his left hip where he has a prosthetic hip.   This worsened and ultimately he underwent aspirate of the area which showed over 20,000 white blood cells and which had a culture that yielded methicillin sensitive Staphylococcus aureus.   He is been followed closely by Dr. [name] and ultimately back to the patient to be brought to the hospital and have resection arthroplasty which was performed on the eighth 2023.  He has had placement of a temporary antibiotic spacer into the left hip.  PICC line is in place and he is appropriately on cefazolin.   We treated him with 6 weeks of postoperative IV cefazolin.  Pain in the hip completely has resolved.  His staples were removed by Dr. [name].  He tells me Dr. [name] is planning on evaluating him after been off antibiotics  for several weeks his antibiotics stopped last week.  He has not had any worsening of the pain in the interim.   He has had persistent edema in his left lower extremity which had bothered him.  Apparently it sounds as if the Doppler was ordered by the physician at the facility he was residing in and that this was negative though I do not have documentation I am comfortable repeating Doppler just to make sure he does not have a deep venous thrombosis.      Past Medical History:  Diagnosis Date   Arthritis    Asthma    Chronic kidney disease    Diabetes mellitus without complication (HCC)    type 2   DJD (degenerative joint disease)    Dysrhythmia    junctional tachycardia and incomplete heart block   Gout    Heart block    Lung nodules    a. 09/2021 CT chest: Interval decrease in size and number of bilateral lung nodules, likely consistent with sequelae associated with an infectious/inflammatory process.   MSSA bacteremia 06/2021   Rotator cuff tear 08/26/2021   Subacute bacterial endocarditis (SBE)    a. 06/2021 TEE: mobile mass attached to the tricuspid valve-->Abx rx-->09/2021 TEE: EF 55-60%, no rwma, nl RV fxn, mild-mod RAE, mild MR, mobile echodense 11x40m mass in the TV apparatus-->conservative rx per TCTS.    Past Surgical History:  Procedure Laterality Date   ACHILLES TENDON REPAIR Left    many years ago per pt  BIOPSY  07/16/2021   Procedure: BIOPSY;  Surgeon: Sharyn Creamer, MD;  Location: [facility];  Service: Gastroenterology;;  EGD and COLON   COLONOSCOPY N/A 07/16/2021   Procedure: COLONOSCOPY;  Surgeon: Sharyn Creamer, MD;  Location: [place];  Service: Gastroenterology;  Laterality: N/A;   COLONOSCOPY WITH PROPOFOL N/A 07/16/2021   Procedure: COLONOSCOPY WITH PROPOFOL;  Surgeon: Sharyn Creamer, MD;  Location: [place];  Service: Gastroenterology;  Laterality: N/A;   ESOPHAGOGASTRODUODENOSCOPY (EGD) WITH PROPOFOL N/A 07/16/2021   Procedure:  ESOPHAGOGASTRODUODENOSCOPY (EGD) WITH PROPOFOL;  Surgeon: Sharyn Creamer, MD;  Location: [place];  Service: Gastroenterology;  Laterality: N/A;   EXCISIONAL TOTAL HIP ARTHROPLASTY WITH ANTIBIOTIC SPACERS Left 05/20/2022   Procedure: EXCISIONAL LEFT TOTAL HIP ARTHROPLASTY WITH ANTIBIOTIC SPACERS;  Surgeon: Mcarthur Rossetti, MD;  [facility];  Service: Orthopedics;  Laterality: Left;   FOOT SURGERY Right    ligaments repaired- many years ago per pt   HERNIA REPAIR     JOINT REPLACEMENT Bilateral    hip   POLYPECTOMY  07/16/2021   Procedure: POLYPECTOMY;  Surgeon: Sharyn Creamer, MD;  Location: [facility] ENDOSCOPY;  Service: Gastroenterology;;   SHOULDER ARTHROSCOPY WITH ROTATOR CUFF REPAIR AND SUBACROMIAL DECOMPRESSION Left 02/10/2022   Procedure: LEFT SHOULDER ARTHROSCOPY WITH EXTENSIVE DEBRIDEMENT, SUBACROMIAL DECOMPRESSION;  Surgeon: Mcarthur Rossetti, MD;  Location: [place];  Service: Orthopedics;  Laterality: Left;   TEE WITHOUT CARDIOVERSION N/A 07/07/2021   Procedure: TRANSESOPHAGEAL ECHOCARDIOGRAM (TEE);  Surgeon: Kate Sable, MD;  Location: [facility];  Service: Cardiovascular;  Laterality: N/A;    Family History  Problem Relation Age of Onset   Other Mother        unknown medical history   Lung cancer Father    Psoriasis Sister    Breast cancer Sister    Hypertension Brother    Hyperlipidemia Brother    Diabetes Brother    Heart attack Brother 53      Social History   Socioeconomic History   Marital status: Widowed    Spouse name: Not on file   Number of children: 1   Years of education: Not on file   Highest education level: Not on file  Occupational History   Not on file  Tobacco Use   Smoking status: Former    Packs/day: 0.25    Years: 15.00    Total pack years: 3.75    Types: Cigarettes    Quit date: 2007    Years since quitting: 16.8   Smokeless tobacco: Never  Vaping Use   Vaping Use: Never used  Substance and Sexual Activity    Alcohol use: Not Currently    Alcohol/week: 6.0 standard drinks of alcohol    Types: 6 Glasses of wine per week    Comment: couple of glasses of wine on the weekends   Drug use: Not Currently   Sexual activity: Not on file  Other Topics Concern   Not on file  Social History Narrative   Not on file   Social Determinants of Health   Financial Resource Strain: Not on file  Food Insecurity: No Food Insecurity (05/20/2022)   Hunger Vital Sign    Worried About Running Out of Food in the Last Year: Never true    [place] in the Last Year: Never true  Transportation Needs: No Transportation Needs (05/20/2022)   PRAPARE - Hydrologist (Medical): No    Lack of Transportation (Non-Medical): No  Physical  Activity: Not on file  Stress: Not on file  Social Connections: Not on file    No Known Allergies   Current Outpatient Medications:    acetaminophen (TYLENOL) 500 MG tablet, Take 500 mg by mouth every 4 (four) hours as needed for mild pain., Disp: , Rfl:    albuterol (VENTOLIN HFA) 108 (90 Base) MCG/ACT inhaler, Inhale 1-2 puffs into the lungs every 6 (six) hours as needed for wheezing or shortness of breath., Disp: 6.7 g, Rfl: 3   aspirin 81 MG chewable tablet, Chew 1 tablet (81 mg total) by mouth 2 (two) times daily., Disp: 30 tablet, Rfl: 0   ceFAZolin (ANCEF) 2-4 GM/100ML-% IVPB, Inject 100 mLs (2 g total) into the vein every 8 (eight) hours., Disp: 1 each, Rfl: 0   ceFAZolin (ANCEF) IVPB, Inject 2 g into the vein every 8 (eight) hours. Indication:  septic arthritis First Dose: Yes Last Day of Therapy:  06/30/2022 Labs - Once weekly:  CBC/D and BMP, Labs - Every other week:  ESR and CRP Method of administration: IV Push Method of administration may be changed at the discretion of home infusion pharmacist based upon assessment of the patient and/or caregiver's ability to self-administer the medication ordered., Disp: 120 Units, Rfl: 0   diclofenac Sodium  (VOLTAREN) 1 % GEL, Apply 2 g topically 2 (two) times daily as needed (pain)., Disp: , Rfl:    furosemide (LASIX) 20 MG tablet, Take 1 tablet (20 mg total) by mouth once daily as needed (swelling or weight gain)., Disp: 30 tablet, Rfl: 2   gabapentin (NEURONTIN) 300 MG capsule, Take 1 capsule (300 mg total) by mouth 2 (two) times daily., Disp: 60 capsule, Rfl: 2   glucose blood (RIGHTEST GS550 BLOOD GLUCOSE) test strip, USE AS DIRECTED TO CHECK BLOOD SUGAR UP TO 4 TIMES DAILY., Disp: 100 strip, Rfl: 0   Insulin Glargine (BASAGLAR KWIKPEN) 100 UNIT/ML, Inject 15 Units into the skin once daily at bedtime., Disp: 15 mL, Rfl: 11   Insulin Pen Needle 32G X 4 MM MISC, USE AS DIRECTED, Disp: 100 each, Rfl: PRN   ketotifen (ZADITOR) 0.025 % ophthalmic solution, Place 1 drop into both eyes daily as needed (allergies)., Disp: , Rfl:    loratadine (CLARITIN) 10 MG tablet, Take 10 mg by mouth daily as needed for allergies., Disp: , Rfl:    metFORMIN (GLUCOPHAGE) 1000 MG tablet, Take 0.5 tablets (500 mg total) by mouth 2 (two) times daily with a meal., Disp: 60 tablet, Rfl: 2   oxyCODONE (OXY IR/ROXICODONE) 5 MG immediate release tablet, TAKE 1-2 TABS (5-10MG TOTAL) BY MOUTH EVERY 4 HOURS AS NEEDED FOR MODERATE PAIN (PAIN SCORE 4-6)., Disp: 30 tablet, Rfl: 0   polyvinyl alcohol (LIQUIFILM TEARS) 1.4 % ophthalmic solution, Place 1 drop into both eyes as needed for dry eyes., Disp: , Rfl:    Rightest GL300 Lancets MISC, USE AS DIRECTED TO CHECK BLOOD SUGAR UP TO 4 TIMES DAILY., Disp: 100 each, Rfl: 0   Review of Systems  Constitutional:  Negative for activity change, appetite change, chills, diaphoresis, fatigue, fever and unexpected weight change.  HENT:  Negative for congestion, rhinorrhea, sinus pressure, sneezing, sore throat and trouble swallowing.   Eyes:  Negative for photophobia and visual disturbance.  Respiratory:  Negative for cough, chest tightness, shortness of breath, wheezing and stridor.    Cardiovascular:  Negative for chest pain, palpitations and leg swelling.  Gastrointestinal:  Negative for abdominal distention, abdominal pain, anal bleeding, blood in stool, constipation,  diarrhea, nausea and vomiting.  Genitourinary:  Negative for difficulty urinating, dysuria, flank pain and hematuria.  Musculoskeletal:  Negative for arthralgias, back pain, gait problem, joint swelling and myalgias.  Skin:  Negative for color change, pallor, rash and wound.  Neurological:  Negative for dizziness, tremors, weakness and light-headedness.  Hematological:  Negative for adenopathy. Does not bruise/bleed easily.  Psychiatric/Behavioral:  Negative for agitation, behavioral problems, confusion, decreased concentration, dysphoric mood and sleep disturbance.        Objective:   Physical Exam Constitutional:      Appearance: He is well-developed.  HENT:     Head: Normocephalic and atraumatic.  Eyes:     Conjunctiva/sclera: Conjunctivae normal.  Cardiovascular:     Rate and Rhythm: Normal rate and regular rhythm.  Pulmonary:     Effort: Pulmonary effort is normal. No respiratory distress.     Breath sounds: No wheezing.  Abdominal:     General: There is no distension.     Palpations: Abdomen is soft.  Musculoskeletal:        General: Swelling present. No tenderness. Normal range of motion.     Cervical back: Normal range of motion and neck supple.  Skin:    General: Skin is warm and dry.     Coloration: Skin is not pale.     Findings: No erythema or rash.  Neurological:     General: No focal deficit present.     Mental Status: He is alert and oriented to person, place, and time.  Psychiatric:        Mood and Affect: Mood normal.        Behavior: Behavior normal.        Thought Content: Thought content normal.        Judgment: Judgment normal.    Left lower extremity is more edematous       Assessment & Plan:   Persistent left lower extremity edema, I expect this is all  postoperative effect and potential damage lymphatics.  Will order repeat Doppler to ensure there is no DVT.  Prosthetic joint infection with Staphylococcus aureus:  Hopefully achieve achieve clear cure he looks improved.  I have counseled him the longer he is off antibiotics and doing well without evidence of recurrence of infection which would typically manifest as pain in particular constant pain or other symptoms such as malaise fevers or other systemic symptoms.  I also told him we typically follow inflammatory markers.  I also told him that some surgeons actually aspirate the joint off antibiotics to see what the cell count looks like.  He has follow-up with Dr. [name] coming up next week.  Told him him we are quite happy to see him again if needed to reassess him or recheck inflammatory markers.

## 2022-07-07 ENCOUNTER — Encounter: Payer: Self-pay | Admitting: Orthopaedic Surgery

## 2022-07-07 ENCOUNTER — Other Ambulatory Visit: Payer: Self-pay

## 2022-07-07 ENCOUNTER — Ambulatory Visit (INDEPENDENT_AMBULATORY_CARE_PROVIDER_SITE_OTHER): Payer: Self-pay | Admitting: Orthopaedic Surgery

## 2022-07-07 DIAGNOSIS — T8459XS Infection and inflammatory reaction due to other internal joint prosthesis, sequela: Secondary | ICD-10-CM | POA: Diagnosis not present

## 2022-07-07 DIAGNOSIS — Z96649 Presence of unspecified artificial hip joint: Secondary | ICD-10-CM

## 2022-07-07 DIAGNOSIS — Z96642 Presence of left artificial hip joint: Secondary | ICD-10-CM

## 2022-07-07 NOTE — Progress Notes (Signed)
The patient comes in today around 7 weeks status post excision arthroplasty from a chronically infected left total hip arthroplasty.  His left total hip was done years ago in [place] and then unfortunately he became bacteremic last year and this seeded that hip.  His PICC line is now removed and he has been off of antibiotics for a week.  He is followed closely by the infectious disease clinic.  He does have left lower extremity edema.  A Doppler ultrasound is scheduled tomorrow to rule out DVT.  His left hip incision looks good.  His antibiotic spacer clinically seems to be intact.  Today we will draw labs in the office including a CBC, sed rate and CRP.  We will also then set him up for a left hip attempted aspiration under fluoroscopy by Dr. Ernestina Patches sometime in a week to 2 weeks from now so at least the patient has been off antibiotics for 2 weeks at that standpoint.  If Dr. Ernestina Patches is able to obtain any fluid I would like him to send that off for Gram stain/culture and cell count if there is no fluid.  The patient understands that replanting implants will all depend on what is found from an infectious standpoint.  All questions and concerns were answered and addressed.

## 2022-07-08 ENCOUNTER — Ambulatory Visit
Admission: RE | Admit: 2022-07-08 | Discharge: 2022-07-08 | Disposition: A | Discharge status: Home / Self Care | Payer: Medicaid Other | Source: Ambulatory Visit | Attending: Infectious Disease | Admitting: Infectious Disease

## 2022-07-08 DIAGNOSIS — R600 Localized edema: Secondary | ICD-10-CM | POA: Diagnosis not present

## 2022-07-08 LAB — CBC WITH DIFFERENTIAL/PLATELET
Absolute Monocytes: 462 cells/uL (ref 200–950)
Basophils Absolute: 62 cells/uL (ref 0–200)
Basophils Relative: 1.4 %
Eosinophils Absolute: 334 cells/uL (ref 15–500)
Eosinophils Relative: 7.6 %
HCT: 31.9 % — ABNORMAL LOW (ref 38.5–50.0)
Hemoglobin: 10.4 g/dL — ABNORMAL LOW (ref 13.2–17.1)
Lymphs Abs: 1078 cells/uL (ref 850–3900)
MCH: 30.3 pg (ref 27.0–33.0)
MCHC: 32.6 g/dL (ref 32.0–36.0)
MCV: 93 fL (ref 80.0–100.0)
MPV: 9.8 fL (ref 7.5–12.5)
Monocytes Relative: 10.5 %
Neutro Abs: 2464 cells/uL (ref 1500–7800)
Neutrophils Relative %: 56 %
Platelets: 97 10*3/uL — ABNORMAL LOW (ref 140–400)
RBC: 3.43 10*6/uL — ABNORMAL LOW (ref 4.20–5.80)
RDW: 13.4 % (ref 11.0–15.0)
Total Lymphocyte: 24.5 %
WBC: 4.4 10*3/uL (ref 3.8–10.8)

## 2022-07-08 LAB — SEDIMENTATION RATE: Sed Rate: 68 mm/h — ABNORMAL HIGH (ref 0–20)

## 2022-07-08 LAB — C-REACTIVE PROTEIN: CRP: 7.9 mg/L (ref <8.0)

## 2022-07-13 ENCOUNTER — Other Ambulatory Visit: Payer: Self-pay | Admitting: Gerontology

## 2022-07-13 ENCOUNTER — Other Ambulatory Visit: Payer: Self-pay

## 2022-07-13 DIAGNOSIS — Z794 Long term (current) use of insulin: Secondary | ICD-10-CM

## 2022-07-13 MED ORDER — RIGHTEST GS550 BLOOD GLUCOSE VI STRP
ORAL_STRIP | 0 refills | Status: AC
  Filled 2022-07-13: qty 100, 25d supply, fill #0

## 2022-07-13 MED ORDER — RIGHTEST GL300 LANCETS MISC
0 refills | Status: DC
  Filled 2022-07-13: qty 100, 25d supply, fill #0

## 2022-07-14 ENCOUNTER — Ambulatory Visit: Payer: Self-pay

## 2022-07-14 ENCOUNTER — Ambulatory Visit (INDEPENDENT_AMBULATORY_CARE_PROVIDER_SITE_OTHER): Payer: Medicaid Other | Admitting: Physical Medicine and Rehabilitation

## 2022-07-14 DIAGNOSIS — M25552 Pain in left hip: Secondary | ICD-10-CM

## 2022-07-14 NOTE — Progress Notes (Signed)
Numeric Pain Rating Scale and Functional Assessment Average Pain 5   In the last MONTH (on 0-10 scale) has pain interfered with the following?  1. General activity like being  able to carry out your everyday physical activities such as walking, climbing stairs, carrying groceries, or moving a chair?  Rating(8)   +Driver, -BT, -Dye Allergies.  Left hip pain with pain in the muscle

## 2022-07-14 NOTE — Progress Notes (Signed)
   Macdonald Rigor - 61 y.o. male MRN [id number]  Date of birth: [birth year]  Office Visit Note: Visit Date: 07/14/2022 PCP: Pcp, No Referred by: Mcarthur Rossetti*  Subjective: Chief Complaint  Patient presents with   Left Hip - Edema   HPI:  Lakeith Careaga is a 61 y.o. male who comes in today at the request of Dr. Jean Rosenthal for planned Left hip aspiration under fluoroscopic guidance.  Please see requesting physician notes for further details and justification.    ROS Otherwise per HPI.  Assessment & Plan: Visit Diagnoses:    ICD-10-CM   1. Pain in left hip  M25.552 XR C-ARM NO REPORT    Large Joint Inj: R hip joint      Plan: No additional findings.   Meds & Orders: No orders of the defined types were placed in this encounter.   Orders Placed This Encounter  Procedures   Large Joint Inj: R hip joint   XR C-ARM NO REPORT    Follow-up: Return for visit to requesting provider as needed.   Procedures: Large Joint Inj: L hip joint on 07/14/2022 10:15 AM Indications: diagnostic evaluation and pain Details: 18 G 3.5 in needle, fluoroscopy-guided anterior approach  Arthrogram: No  Outcome: tolerated well, no immediate complications  Biplanar imaging was utilized to position the needle tip initially at the femoral neck with continuous aspiration to the ball and underneath and in the acetabulum area.  Scant amount of bloody fluid obtained likely traumatic.  10 mL of sterile normal saline injected without any aspiration. Procedure, treatment alternatives, risks and benefits explained, specific risks discussed. Consent was given by the patient. Immediately prior to procedure a time out was called to verify the correct patient, procedure, equipment, support staff and site/side marked as required. Patient was prepped and draped in the usual sterile fashion.          Clinical History: No specialty comments available.     Objective:  VS:  HT:    WT:    BMI:     BP:   HR: bpm  TEMP: ( )  RESP:  Physical Exam   Imaging: No results found.

## 2022-07-27 ENCOUNTER — Telehealth: Payer: Self-pay

## 2022-07-27 NOTE — Telephone Encounter (Signed)
Patient called inquiring about setting up surgery to reimplant left THA.  Please advise next step.

## 2022-07-28 NOTE — Telephone Encounter (Signed)
Patient aware of the below message and he will call back when he is near his calendar and make an appt

## 2022-08-12 DIAGNOSIS — Z419 Encounter for procedure for purposes other than remedying health state, unspecified: Secondary | ICD-10-CM | POA: Diagnosis not present

## 2022-08-17 ENCOUNTER — Encounter: Payer: Self-pay | Admitting: Orthopaedic Surgery

## 2022-08-17 ENCOUNTER — Ambulatory Visit (INDEPENDENT_AMBULATORY_CARE_PROVIDER_SITE_OTHER): Payer: Medicaid Other | Admitting: Orthopaedic Surgery

## 2022-08-17 DIAGNOSIS — T8459XD Infection and inflammatory reaction due to other internal joint prosthesis, subsequent encounter: Secondary | ICD-10-CM | POA: Diagnosis not present

## 2022-08-17 DIAGNOSIS — T8459XS Infection and inflammatory reaction due to other internal joint prosthesis, sequela: Secondary | ICD-10-CM

## 2022-08-17 DIAGNOSIS — Z96649 Presence of unspecified artificial hip joint: Secondary | ICD-10-CM | POA: Diagnosis not present

## 2022-08-17 MED ORDER — HYDROCODONE-ACETAMINOPHEN 5-325 MG PO TABS: 1.0000 | ORAL_TABLET | Freq: Three times a day (TID) | ORAL | 0 refills | Status: DC | PRN

## 2022-08-17 NOTE — Progress Notes (Signed)
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  The original hip was replaced years ago in [place].  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  In early September we removed all components and placed a temporary antibiotic spacer.  He has been taking it easy on that hip.  Recently we sent him for an aspiration of the hip and this was negative for any infection.  His CRP, CBC and sed rate have all trended down appropriately.  He has been off antibiotics for a while now.  He feels okay.  On exam I can easily put his hip to range of motion with minimal discomfort.  His incision is healed there is no redness.  At this point we need to set him up for revision arthroplasty.  He understands this will be a more difficult procedure having to go in and remove the temporary antibiotic spacer and then placed a new hip.  He will remain off antibiotics.  We will obtain intraoperative cultures at the time of the revision arthroplasty to make sure that the infection is clear.  Fortunately it was a sensitive infection with MSSA.  I did describe what the revision surgery was involved.  We will work on getting this set up in the near future.  All questions concerns were answered and addressed.

## 2022-08-23 ENCOUNTER — Other Ambulatory Visit: Payer: Self-pay | Admitting: Gerontology

## 2022-08-23 ENCOUNTER — Other Ambulatory Visit: Payer: Self-pay

## 2022-08-23 DIAGNOSIS — G629 Polyneuropathy, unspecified: Secondary | ICD-10-CM

## 2022-08-23 MED ORDER — GABAPENTIN 300 MG PO CAPS
300.0000 mg | ORAL_CAPSULE | Freq: Two times a day (BID) | ORAL | 0 refills | Status: DC
  Filled 2022-08-23: qty 60, 30d supply, fill #0

## 2022-08-24 ENCOUNTER — Other Ambulatory Visit: Payer: Self-pay

## 2022-08-25 ENCOUNTER — Ambulatory Visit: Payer: Medicaid Other | Admitting: Gerontology

## 2022-08-25 ENCOUNTER — Encounter: Payer: Self-pay | Admitting: Gerontology

## 2022-08-25 ENCOUNTER — Other Ambulatory Visit: Payer: Self-pay

## 2022-08-25 VITALS — BP 129/75 | HR 96 | Temp 97.7°F | Wt 220.9 lb

## 2022-08-25 DIAGNOSIS — Z794 Long term (current) use of insulin: Secondary | ICD-10-CM

## 2022-08-25 DIAGNOSIS — G629 Polyneuropathy, unspecified: Secondary | ICD-10-CM

## 2022-08-25 LAB — POCT GLYCOSYLATED HEMOGLOBIN (HGB A1C): Hemoglobin A1C: 6.5 % — AB (ref 4.0–5.6)

## 2022-08-25 LAB — GLUCOSE, POCT (MANUAL RESULT ENTRY): POC Glucose: 98 mg/dl (ref 70–99)

## 2022-08-25 MED ORDER — GABAPENTIN 300 MG PO CAPS
300.0000 mg | ORAL_CAPSULE | Freq: Two times a day (BID) | ORAL | 0 refills | Status: DC
  Filled 2022-08-25: qty 180, 90d supply, fill #0

## 2022-08-25 MED ORDER — METFORMIN HCL 1000 MG PO TABS
500.0000 mg | ORAL_TABLET | Freq: Two times a day (BID) | ORAL | 0 refills | Status: DC
  Filled 2022-08-25: qty 90, 90d supply, fill #0

## 2022-08-25 NOTE — Patient Instructions (Signed)

## 2022-08-25 NOTE — Progress Notes (Signed)
Established Patient Office Visit  Subjective   Patient ID: Joshua Cole, male    DOB: 1961/08/30  Age: 61 y.o. MRN: [id number]  Chief Complaint  Patient presents with   Medication Refill   Follow-up    HPI  Joshua Cole  is a 61 year old male who has history of arthritis, type 2 diabetes, DJD, Gout who presents for routine follow up.  He states that he's compliant with his medications, denies side effects and continues to make healthy lifestyle changes.He was discharged from the hospital on 05/27/22 and during his hospital course, he underwent Excisional left total hip arthroplasty with antibiotic spacers. He had his 3 month follow up on 08/17/22 with Orthopedic Surgery Dr [name] C.Y ,for prosthetic hip infection it was recommended that his temporary antibiotic spacer will be removed and replaced with new hip. His HgbA1c done on 03/31/22 was 7.4% decreased to 6.5%, he states that he has not been taking his Basaglar his blood glucose was 98 mg/dl. He denies hypo/hyperglycemic symptoms, and performs daily foot checks. He has active Medicaid. Overall, he states that he's doing well and offers no further complaint.    Review of Systems  Constitutional: Negative.   Respiratory: Negative.    Cardiovascular: Negative.   Musculoskeletal:  Positive for joint pain (s/p left hip arthroplasty.). Negative for back pain.  Neurological: Negative.   Endo/Heme/Allergies: Negative.   Psychiatric/Behavioral: Negative.        Objective:     BP 129/75   Pulse 96   Temp 97.7 F (36.5 C) (Temporal)   Wt 220 lb 14.4 oz (100.2 kg)   SpO2 94%   BMI 31.70 kg/m  BP Readings from Last 3 Encounters:  08/25/22 129/75  07/04/22 115/75  05/27/22 117/73   Wt Readings from Last 3 Encounters:  08/25/22 220 lb 14.4 oz (100.2 kg)  05/20/22 227 lb (103 kg)  05/18/22 227 lb (103 kg)      Physical Exam HENT:     Head: Normocephalic and atraumatic.     Mouth/Throat:     Mouth: Mucous membranes  are moist.  Eyes:     Extraocular Movements: Extraocular movements intact.     Conjunctiva/sclera: Conjunctivae normal.     Pupils: Pupils are equal, round, and reactive to light.  Cardiovascular:     Rate and Rhythm: Normal rate and regular rhythm.     Pulses: Normal pulses.     Heart sounds: Normal heart sounds.  Pulmonary:     Effort: Pulmonary effort is normal.     Breath sounds: Normal breath sounds.  Musculoskeletal:        General: Tenderness (left upper thigh and hip s/p athroplasty) present.  Skin:    General: Skin is warm.  Neurological:     General: No focal deficit present.     Mental Status: He is alert and oriented to person, place, and time. Mental status is at baseline.  Psychiatric:        Mood and Affect: Mood normal.        Behavior: Behavior normal.        Thought Content: Thought content normal.        Judgment: Judgment normal.      Results for orders placed or performed in visit on 08/25/22  POCT Glucose (CBG)  Result Value Ref Range   POC Glucose 98 70 - 99 mg/dl  POCT HgB A1C  Result Value Ref Range   Hemoglobin A1C 6.5 (A) 4.0 - 5.6 %   HbA1c  POC (<> result, manual entry)     HbA1c, POC (prediabetic range)     HbA1c, POC (controlled diabetic range)      Last CBC Lab Results  Component Value Date   WBC 4.4 07/07/2022   HGB 10.4 (L) 07/07/2022   HCT 31.9 (L) 07/07/2022   MCV 93.0 07/07/2022   MCH 30.3 07/07/2022   RDW 13.4 07/07/2022   PLT 97 (L) 50/35/4656   Last metabolic panel Lab Results  Component Value Date   GLUCOSE 188 (H) 05/21/2022   NA 135 05/21/2022   K 4.4 05/21/2022   CL 105 05/21/2022   CO2 25 05/21/2022   BUN 11 05/21/2022   CREATININE 0.59 (L) 05/21/2022   GFRNONAA >60 05/21/2022   CALCIUM 8.9 05/21/2022   PHOS 4.3 07/16/2021   PROT 8.9 (H) 09/20/2021   ALBUMIN 4.2 09/20/2021   LABGLOB 4.7 (H) 09/20/2021   AGRATIO 0.9 (L) 09/20/2021   BILITOT 0.5 09/20/2021   ALKPHOS 90 09/20/2021   AST 13 09/20/2021   ALT  2 09/20/2021   ANIONGAP 5 05/21/2022   Last lipids Lab Results  Component Value Date   CHOL 135 08/25/2021   HDL 34 (L) 08/25/2021   LDLCALC 84 08/25/2021   TRIG 88 08/25/2021   CHOLHDL 4.0 08/25/2021   Last hemoglobin A1c Lab Results  Component Value Date   HGBA1C 6.5 (A) 08/25/2022      The 10-year ASCVD risk score (Arnett DK, et al., 2019) is: 13.8%    Assessment & Plan:   1. Peripheral polyneuropathy - He will continue on current medication. - gabapentin (NEURONTIN) 300 MG capsule; Take 1 capsule (300 mg total) by mouth 2 (two) times daily.  Dispense: 180 capsule; Refill: 0  2. Type 2 diabetes mellitus without complication, with long-term current use of insulin ([place]) - His Basaglar was discontinued, will continue on Metformin, his diabetes is improving with HgbA1c of 6.5%. He was encouraged to continue on low carb/non concentrated sweet diet and exercise as tolerated. - metFORMIN (GLUCOPHAGE) 1000 MG tablet; Take 0.5 tablets (500 mg total) by mouth 2 (two) times daily with a meal.  Dispense: 180 tablet; Refill: 0 - POCT Glucose (CBG) - POCT HgB A1C    Return if symptoms worsen or fail to improve. He has no follow up appointment because his Medicaid is active. [place] wishes him well with his care.   Clayson Riling Jerold Coombe, NP

## 2022-09-12 DIAGNOSIS — Z419 Encounter for procedure for purposes other than remedying health state, unspecified: Secondary | ICD-10-CM | POA: Diagnosis not present

## 2022-09-15 ENCOUNTER — Other Ambulatory Visit: Payer: Self-pay

## 2022-09-19 ENCOUNTER — Other Ambulatory Visit: Payer: Self-pay | Admitting: Physician Assistant

## 2022-09-19 DIAGNOSIS — Z01818 Encounter for other preprocedural examination: Secondary | ICD-10-CM

## 2022-09-26 NOTE — Patient Instructions (Signed)
SURGICAL WAITING ROOM VISITATION  Patients having surgery or a procedure may have no more than 2 support people in the waiting area - these visitors may rotate.    Children under the age of 68 must have an adult with them who is not the patient.  Due to an increase in RSV and influenza rates and associated hospitalizations, children ages 9 and under may not visit patients in [place].  If the patient needs to stay at the hospital during part of their recovery, the visitor guidelines for inpatient rooms apply. Pre-op nurse will coordinate an appropriate time for 1 support person to accompany patient in pre-op.  This support person may not rotate.    Please refer to the [facility] website for the visitor guidelines for Inpatients (after your surgery is over and you are in a regular room).    Your procedure is scheduled on: 10/06/22   Report to [facility] Main Entrance    Report to admitting at 5:15 AM   Call this number if you have problems the morning of surgery [phone]   Do not eat food :After Midnight.   After Midnight you may have the following liquids until 4:30 AM DAY OF SURGERY  Water Non-Citrus Juices (without pulp, NO RED-Apple, White grape, White cranberry) Black Coffee (NO MILK/CREAM OR CREAMERS, sugar ok)  Clear Tea (NO MILK/CREAM OR CREAMERS, sugar ok) regular and decaf                             Plain Jell-O (NO RED)                                           Fruit ices (not with fruit pulp, NO RED)                                     Popsicles (NO RED)                                                               Sports drinks like Gatorade (NO RED)                 The day of surgery:  Drink ONE (1) Pre-Surgery G2 at 4:30 AM the morning of surgery. Drink in one sitting. Do not sip.  This drink was given to you during your hospital  pre-op appointment visit. Nothing else to drink after completing the  Pre-Surgery G2.          If you  have questions, please contact your surgeon's office.   FOLLOW BOWEL PREP AND ANY ADDITIONAL PRE OP INSTRUCTIONS YOU RECEIVED FROM YOUR SURGEON'S OFFICE!!!     Oral Hygiene is also important to reduce your risk of infection.                                    Remember - BRUSH YOUR TEETH THE MORNING OF SURGERY WITH YOUR REGULAR TOOTHPASTE  DENTURES WILL BE REMOVED  PRIOR TO SURGERY PLEASE DO NOT APPLY "Poly grip" OR ADHESIVES!!!   Take these medicines the morning of surgery with A SIP OF WATER: Tylenol, Inhalers, Gabapentin, Claritin   DO NOT TAKE ANY ORAL DIABETIC MEDICATIONS DAY OF YOUR SURGERY  How to Manage Your Diabetes Before and After Surgery  Why is it important to control my blood sugar before and after surgery? Improving blood sugar levels before and after surgery helps healing and can limit problems. A way of improving blood sugar control is eating a healthy diet by:  Eating less sugar and carbohydrates  Increasing activity/exercise  Talking with your doctor about reaching your blood sugar goals High blood sugars (greater than 180 mg/dL) can raise your risk of infections and slow your recovery, so you will need to focus on controlling your diabetes during the weeks before surgery. Make sure that the doctor who takes care of your diabetes knows about your planned surgery including the date and location.  How do I manage my blood sugar before surgery? Check your blood sugar at least 4 times a day, starting 2 days before surgery, to make sure that the level is not too high or low. Check your blood sugar the morning of your surgery when you wake up and every 2 hours until you get to the Short Stay unit. If your blood sugar is less than 70 mg/dL, you will need to treat for low blood sugar: Do not take insulin. Treat a low blood sugar (less than 70 mg/dL) with  cup of clear juice (cranberry or apple), 4 glucose tablets, OR glucose gel. Recheck blood sugar in 15 minutes after  treatment (to make sure it is greater than 70 mg/dL). If your blood sugar is not greater than 70 mg/dL on recheck, call [phone] for further instructions. Report your blood sugar to the short stay nurse when you get to Short Stay.  If you are admitted to the hospital after surgery: Your blood sugar will be checked by the staff and you will probably be given insulin after surgery (instead of oral diabetes medicines) to make sure you have good blood sugar levels. The goal for blood sugar control after surgery is 80-180 mg/dL.   WHAT DO I DO ABOUT MY DIABETES MEDICATION?  Do not take oral diabetes medicines (pills) the morning of surgery.  THE DAY BEFORE SURGERY, take Metformin as prescribed.     THE MORNING OF SURGERY, do not take Metformin.  Reviewed and Endorsed by [facility] Patient Education Committee, August 2015  Bring CPAP mask and tubing day of surgery.                              You may not have any metal on your body including jewelry, and body piercing             Do not wear lotions, powders, cologne, or deodorant              Men may shave face and neck.   Do not bring valuables to the hospital. [place].   Contacts, glasses, dentures or bridgework may not be worn into surgery.   Bring small overnight bag day of surgery.   DO NOT [place]. PHARMACY WILL DISPENSE MEDICATIONS LISTED ON YOUR MEDICATION LIST TO YOU DURING YOUR ADMISSION  [place]!              Please read over the following fact sheets you were given: [name] [name]    If you received a COVID test during your pre-op visit  it is requested that you wear a mask when out in public, stay away from anyone that may not be feeling well and notify your surgeon if you develop symptoms. If you test positive for Covid or have been in contact with  anyone that has tested positive in the last 10 days please notify you surgeon.    [facility] - Preparing for Surgery Before surgery, you can play an important role.  Because skin is not sterile, your skin needs to be as free of germs as possible.  You can reduce the number of germs on your skin by washing with CHG (chlorahexidine gluconate) soap before surgery.  CHG is an antiseptic cleaner which kills germs and bonds with the skin to continue killing germs even after washing. Please DO NOT use if you have an allergy to CHG or antibacterial soaps.  If your skin becomes reddened/irritated stop using the CHG and inform your nurse when you arrive at Short Stay. Do not shave (including legs and underarms) for at least 48 hours prior to the first CHG shower.  You may shave your face/neck.  Please follow these instructions carefully:  1.  Shower with CHG Soap the night before surgery and the  morning of surgery.  2.  If you choose to wash your hair, wash your hair first as usual with your normal  shampoo.  3.  After you shampoo, rinse your hair and body thoroughly to remove the shampoo.                             4.  Use CHG as you would any other liquid soap.  You can apply chg directly to the skin and wash.  Gently with a scrungie or clean washcloth.  5.  Apply the CHG Soap to your body ONLY FROM THE NECK DOWN.   Do   not use on face/ open                           Wound or open sores. Avoid contact with eyes, ears mouth and   genitals (private parts).                       Wash face,  Genitals (private parts) with your normal soap.             6.  Wash thoroughly, paying special attention to the area where your    surgery  will be performed.  7.  Thoroughly rinse your body with warm water from the neck down.  8.  DO NOT shower/wash with your normal soap after using and rinsing off the CHG Soap.                9.  Pat yourself dry with a clean towel.            10.  Wear clean pajamas.             11.  Place clean sheets on your bed the night of your first shower and do not  sleep with pets. Day of Surgery :  Do not apply any lotions/deodorants the morning of surgery.  Please wear clean clothes to the hospital/surgery center.  FAILURE TO FOLLOW THESE INSTRUCTIONS MAY RESULT IN THE CANCELLATION OF YOUR SURGERY  PATIENT SIGNATURE_________________________________  NURSE SIGNATURE__________________________________  ________________________________________________________________________  Joshua Cole  An incentive spirometer is a tool that can help keep your lungs clear and active. This tool measures how well you are filling your lungs with each breath. Taking long deep breaths may help reverse or decrease the chance of developing breathing (pulmonary) problems (especially infection) following: A long period of time when you are unable to move or be active. BEFORE THE PROCEDURE  If the spirometer includes an indicator to show your best effort, your nurse or respiratory therapist will set it to a desired goal. If possible, sit up straight or lean slightly forward. Try not to slouch. Hold the incentive spirometer in an upright position. INSTRUCTIONS FOR USE  Sit on the edge of your bed if possible, or sit up as far as you can in bed or on a chair. Hold the incentive spirometer in an upright position. Breathe out normally. Place the mouthpiece in your mouth and seal your lips tightly around it. Breathe in slowly and as deeply as possible, raising the piston or the ball toward the top of the column. Hold your breath for 3-5 seconds or for as long as possible. Allow the piston or ball to fall to the bottom of the column. Remove the mouthpiece from your mouth and breathe out normally. Rest for a few seconds and repeat Steps 1 through 7 at least 10 times every 1-2 hours when you are awake. Take your time and take a few normal breaths between deep breaths. The spirometer may include an  indicator to show your best effort. Use the indicator as a goal to work toward during each repetition. After each set of 10 deep breaths, practice coughing to be sure your lungs are clear. If you have an incision (the cut made at the time of surgery), support your incision when coughing by placing a pillow or rolled up towels firmly against it. Once you are able to get out of bed, walk around indoors and cough well. You may stop using the incentive spirometer when instructed by your caregiver.  RISKS AND COMPLICATIONS Take your time so you do not get dizzy or light-headed. If you are in pain, you may need to take or ask for pain medication before doing incentive spirometry. It is harder to take a deep breath if you are having pain. AFTER USE Rest and breathe slowly and easily. It can be helpful to keep track of a log of your progress. Your caregiver can provide you with a simple table to help with this. If you are using the spirometer at home, follow these instructions: [place] IF:  You are having difficultly using the spirometer. You have trouble using the spirometer as often as instructed. Your pain medication is not giving enough relief while using the spirometer. You develop fever of 100.5 F (38.1 C) or higher. SEEK IMMEDIATE MEDICAL CARE IF:  You cough up bloody sputum that had not been present before. You develop fever of 102 F (38.9 C) or greater. You develop worsening pain at or near the incision site. MAKE SURE YOU:  Understand these instructions. Will watch your condition. Will get help right away if you are not doing well or get worse. Document Released: 01/09/2007 Document Revised: 11/21/2011 Document Reviewed: 03/12/2007 ExitCare Patient Information 2014  [employer].   ________________________________________________________________________ WHAT IS A BLOOD TRANSFUSION? Blood Transfusion Information  A transfusion is the replacement of blood or some of its  parts. Blood is made up of multiple cells which provide different functions. Red blood cells carry oxygen and are used for blood loss replacement. White blood cells fight against infection. Platelets control bleeding. Plasma helps clot blood. Other blood products are available for specialized needs, such as hemophilia or other clotting disorders. BEFORE THE TRANSFUSION  Who gives blood for transfusions?  Healthy volunteers who are fully evaluated to make sure their blood is safe. This is blood bank blood. Transfusion therapy is the safest it has ever been in the practice of medicine. Before blood is taken from a donor, a complete history is taken to make sure that person has no history of diseases nor engages in risky social behavior (examples are intravenous drug use or sexual activity with multiple partners). The donor's travel history is screened to minimize risk of transmitting infections, such as malaria. The donated blood is tested for signs of infectious diseases, such as HIV and hepatitis. The blood is then tested to be sure it is compatible with you in order to minimize the chance of a transfusion reaction. If you or a relative donates blood, this is often done in anticipation of surgery and is not appropriate for emergency situations. It takes many days to process the donated blood. RISKS AND COMPLICATIONS Although transfusion therapy is very safe and saves many lives, the main dangers of transfusion include:  Getting an infectious disease. Developing a transfusion reaction. This is an allergic reaction to something in the blood you were given. Every precaution is taken to prevent this. The decision to have a blood transfusion has been considered carefully by your caregiver before blood is given. Blood is not given unless the benefits outweigh the risks. AFTER THE TRANSFUSION Right after receiving a blood transfusion, you will usually feel much better and more energetic. This is especially  true if your red blood cells have gotten low (anemic). The transfusion raises the level of the red blood cells which carry oxygen, and this usually causes an energy increase. The nurse administering the transfusion will monitor you carefully for complications. HOME CARE INSTRUCTIONS  No special instructions are needed after a transfusion. You may find your energy is better. Speak with your caregiver about any limitations on activity for underlying diseases you may have. SEEK MEDICAL CARE IF:  Your condition is not improving after your transfusion. You develop redness or irritation at the intravenous (IV) site. SEEK IMMEDIATE MEDICAL CARE IF:  Any of the following symptoms occur over the next 12 hours: Shaking chills. You have a temperature by mouth above 102 F (38.9 C), not controlled by medicine. Chest, back, or muscle pain. People around you feel you are not acting correctly or are confused. Shortness of breath or difficulty breathing. Dizziness and fainting. You get a rash or develop hives. You have a decrease in urine output. Your urine turns a dark color or changes to pink, red, or brown. Any of the following symptoms occur over the next 10 days: You have a temperature by mouth above 102 F (38.9 C), not controlled by medicine. Shortness of breath. Weakness after normal activity. The white part of the eye turns yellow (jaundice). You have a decrease in the amount of urine or are urinating less often. Your urine turns a dark color or changes to pink, red, or brown. Document Released: 08/26/2000 Document Revised:  11/21/2011 Document Reviewed: 04/14/2008 ExitCare Patient Information 2014 [place], [place].  _______________________________________________________________________

## 2022-09-26 NOTE — Progress Notes (Addendum)
COVID Vaccine Completed:  Date of COVID positive in last 90 days:  PCP -  Cardiologist - Kathlyn [place], MD Electrophysiologist- Lars Mage, MD  Cardiac clearance?  CT- 10/08/21 Epic Chest x-ray -  EKG - 03/02/22 Epic Stress Test - 12/06/21 Epic ECHO - 10/05/21 Epic Cardiac Cath -  Pacemaker/ICD device last checked: Spinal Cord Stimulator:  Bowel Prep -   Sleep Study -  CPAP -   Fasting Blood Sugar -  Checks Blood Sugar _____ times a day  Last dose of GLP1 agonist-  N/A GLP1 instructions:  N/A   Last dose of SGLT-2 inhibitors-  N/A SGLT-2 instructions: N/A   Blood Thinner Instructions: Aspirin Instructions: ASA 81 Last Dose:  Activity level:  Can go up a flight of stairs and perform activities of daily living without stopping and without symptoms of chest pain or shortness of breath.  Able to exercise without symptoms  Unable to go up a flight of stairs without symptoms of     Anesthesia review: heart block, acute bacterial endocarditis, DM2, CKD, lung nodules, tricuspid valve vegetation, SOB  Patient denies shortness of breath, fever, cough and chest pain at PAT appointment  Patient verbalized understanding of instructions that were given to them at the PAT appointment. Patient was also instructed that they will need to review over the PAT instructions again at home before surgery.

## 2022-09-27 ENCOUNTER — Encounter (HOSPITAL_COMMUNITY)
Admission: RE | Admit: 2022-09-27 | Discharge: 2022-09-27 | Disposition: A | Discharge status: Home / Self Care | Payer: Medicaid Other | Source: Ambulatory Visit | Attending: Orthopaedic Surgery | Admitting: Orthopaedic Surgery

## 2022-09-27 ENCOUNTER — Encounter (HOSPITAL_COMMUNITY): Payer: Self-pay

## 2022-09-27 VITALS — BP 131/78 | HR 83 | Temp 98.1°F | Resp 10 | Ht 70.0 in | Wt 224.0 lb

## 2022-09-27 DIAGNOSIS — Z01812 Encounter for preprocedural laboratory examination: Secondary | ICD-10-CM | POA: Diagnosis not present

## 2022-09-27 DIAGNOSIS — Z01818 Encounter for other preprocedural examination: Secondary | ICD-10-CM

## 2022-09-27 DIAGNOSIS — E119 Type 2 diabetes mellitus without complications: Secondary | ICD-10-CM | POA: Diagnosis not present

## 2022-09-27 DIAGNOSIS — Z794 Long term (current) use of insulin: Secondary | ICD-10-CM | POA: Insufficient documentation

## 2022-09-27 LAB — COMPREHENSIVE METABOLIC PANEL
ALT: 13 U/L (ref 0–44)
AST: 19 U/L (ref 15–41)
Albumin: 4 g/dL (ref 3.5–5.0)
Alkaline Phosphatase: 121 U/L (ref 38–126)
Anion gap: 9 (ref 5–15)
BUN: 12 mg/dL (ref 6–20)
CO2: 23 mmol/L (ref 22–32)
Calcium: 9.2 mg/dL (ref 8.9–10.3)
Chloride: 103 mmol/L (ref 98–111)
Creatinine, Ser: 0.66 mg/dL (ref 0.61–1.24)
GFR, Estimated: >60 mL/min (ref >60)
Glucose, Bld: 121 mg/dL — ABNORMAL HIGH (ref 70–99)
Potassium: 4.1 mmol/L (ref 3.5–5.1)
Sodium: 135 mmol/L (ref 135–145)
Total Bilirubin: 1 mg/dL (ref 0.3–1.2)
Total Protein: 8.5 g/dL — ABNORMAL HIGH (ref 6.5–8.1)

## 2022-09-27 LAB — CBC
HCT: 41.5 % (ref 39.0–52.0)
Hemoglobin: 13.5 g/dL (ref 13.0–17.0)
MCH: 30.9 pg (ref 26.0–34.0)
MCHC: 32.5 g/dL (ref 30.0–36.0)
MCV: 95 fL (ref 80.0–100.0)
Platelets: 115 10*3/uL — ABNORMAL LOW (ref 150–400)
RBC: 4.37 MIL/uL (ref 4.22–5.81)
RDW: 15 % (ref 11.5–15.5)
WBC: 6.5 10*3/uL (ref 4.0–10.5)
nRBC: 0 % (ref 0.0–0.2)

## 2022-09-27 LAB — SURGICAL PCR SCREEN
MRSA, PCR: NEGATIVE
Staphylococcus aureus: POSITIVE — AB

## 2022-09-27 LAB — GLUCOSE, CAPILLARY: Glucose-Capillary: 115 mg/dL — ABNORMAL HIGH (ref 70–99)

## 2022-09-27 LAB — HEMOGLOBIN A1C
Hgb A1c MFr Bld: 5.9 % — ABNORMAL HIGH (ref 4.8–5.6)
Mean Plasma Glucose: 122.63 mg/dL

## 2022-09-27 NOTE — Progress Notes (Signed)
[facility]+ and type and screen + results routed to Dr. [name].

## 2022-09-27 NOTE — Progress Notes (Signed)
Type and screen put in DOS for positive antibodies

## 2022-10-05 NOTE — H&P (Signed)
Joshua Cole is an 62 y.o. male.   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  Following surgery he was on 6 weeks of IV antibiotics and then eventually transition to oral antibiotics.  He is now off of all antibiotics.  His inflammatory markers have normalized as well as his white blood cell count.  An aspiration of his left hip was negative.  At this point he presents for a revision arthroplasty of the left hip with hopeful removal of the antibiotic spacer and placing a new hip prosthesis.  This the second of a two-stage process.  The patient's original hip replacement was performed many years ago in [place].  Over a year ago he presented to this health system with significant bacteremia and was very sick.  At some point his left total hip arthroplasty was seated and he underwent an excision arthroplasty due to that infection.  Past Medical History:  Diagnosis Date   Arthritis    Asthma    Chronic kidney disease    Diabetes mellitus without complication (HCC)    type 2   DJD (degenerative joint disease)    Dysrhythmia    junctional tachycardia and incomplete heart block   Edema of left lower extremity 07/04/2022   Gout    Heart block    Lung nodules    a. 09/2021 CT chest: Interval decrease in size and number of bilateral lung nodules, likely consistent with sequelae associated with an infectious/inflammatory process.   MSSA bacteremia 06/2021   Rotator cuff tear 08/26/2021   Subacute bacterial endocarditis (SBE)    a. 06/2021 TEE: mobile mass attached to the tricuspid valve-->Abx rx-->09/2021 TEE: EF 55-60%, no rwma, nl RV fxn, mild-mod RAE, mild MR, mobile echodense 11x59m mass in the TV apparatus-->conservative rx per TCTS.    Past Surgical History:  Procedure Laterality Date   ACHILLES TENDON  REPAIR Left    many years ago per pt   BIOPSY  07/16/2021   Procedure: BIOPSY;  Surgeon: DSharyn Creamer MD;  [facility]  Service: Gastroenterology;;  EGD and COLON   COLONOSCOPY N/A 07/16/2021   Procedure: COLONOSCOPY;  Surgeon: DSharyn Creamer MD;  [place]  Service: Gastroenterology;  Laterality: N/A;   COLONOSCOPY WITH PROPOFOL N/A 07/16/2021   Procedure: COLONOSCOPY WITH PROPOFOL;  Surgeon: DSharyn Creamer MD;  [place]  Service: Gastroenterology;  Laterality: N/A;   ESOPHAGOGASTRODUODENOSCOPY (EGD) WITH PROPOFOL N/A 07/16/2021   Procedure: ESOPHAGOGASTRODUODENOSCOPY (EGD) WITH PROPOFOL;  Surgeon: DSharyn Creamer MD;  [place]  Service: Gastroenterology;  Laterality: N/A;   EXCISIONAL TOTAL HIP ARTHROPLASTY WITH ANTIBIOTIC SPACERS Left 05/20/2022   Procedure: EXCISIONAL LEFT TOTAL HIP ARTHROPLASTY WITH ANTIBIOTIC SPACERS;  Surgeon: BMcarthur Rossetti MD;  Location: [facility];  Service: Orthopedics;  Laterality: Left;   FOOT SURGERY Right    ligaments repaired- many years ago per pt   HERNIA REPAIR     JOINT REPLACEMENT Bilateral    hip   POLYPECTOMY  07/16/2021   Procedure: POLYPECTOMY;  Surgeon: DSharyn Creamer MD;  [facility];  Service: Gastroenterology;;   SHOULDER ARTHROSCOPY WITH ROTATOR CUFF REPAIR AND SUBACROMIAL DECOMPRESSION Left 02/10/2022   Procedure: LEFT SHOULDER ARTHROSCOPY WITH EXTENSIVE DEBRIDEMENT, SUBACROMIAL DECOMPRESSION;  Surgeon: BMcarthur Rossetti MD;  [place]  Service: Orthopedics;  Laterality: Left;  TEE WITHOUT CARDIOVERSION N/A 07/07/2021   Procedure: TRANSESOPHAGEAL ECHOCARDIOGRAM (TEE);  Surgeon: Kate Sable, MD;  Location: [facility];  Service: Cardiovascular;  Laterality: N/A;    Family History  Problem Relation Age of Onset   Other Mother        unknown medical history   Lung cancer Father    Psoriasis Sister    Breast cancer Sister    Hypertension Brother     Hyperlipidemia Brother    Diabetes Brother    Heart attack Brother 28   Social History:  reports that he quit smoking about 17 years ago. His smoking use included cigarettes. He has a 3.75 pack-year smoking history. He has never used smokeless tobacco. He reports that he does not currently use alcohol after a past usage of about 20.0 standard drinks of alcohol per week. He reports that he does not currently use drugs.  Allergies: No Known Allergies  No medications prior to admission.    No results found for this or any previous visit (from the past 48 hour(s)). No results found.  Review of Systems  All other systems reviewed and are negative.   There were no vitals taken for this visit. Physical Exam Vitals reviewed.  Constitutional:      Appearance: Normal appearance. He is normal weight.  HENT:     Head: Normocephalic and atraumatic.  Eyes:     Extraocular Movements: Extraocular movements intact.     Pupils: Pupils are equal, round, and reactive to light.  Cardiovascular:     Rate and Rhythm: Normal rate.     Pulses: Normal pulses.  Pulmonary:     Effort: Pulmonary effort is normal.     Breath sounds: Normal breath sounds.  Abdominal:     Palpations: Abdomen is soft.  Musculoskeletal:     Cervical back: Normal range of motion and neck supple.     Left hip: Decreased range of motion. Decreased strength.  Neurological:     Mental Status: He is alert and oriented to person, place, and time.  Psychiatric:        Behavior: Behavior normal.      Assessment/Plan History of a previous infected left total hip arthroplasty  The patient now presents for removal of the antibiotic spacer and hopefully revising him to a new total hip arthroplasty.  It appears that the infection is hopefully cleared and the environment is stable in the left hip tissues to hopefully support a new total hip arthroplasty.  The patient understands that there is still heightened risks of infection  as well as risk of fracture, acute blood loss anemia, nerve vessel injury, DVT, dislocation, implant failure and wound healing issues.  He understands her goals are hopefully decrease pain, improve mobility and overall improve quality of life.  Mcarthur Rossetti, MD 10/05/2022, 7:45 PM

## 2022-10-06 ENCOUNTER — Other Ambulatory Visit: Payer: Self-pay

## 2022-10-06 ENCOUNTER — Inpatient Hospital Stay (HOSPITAL_COMMUNITY): Payer: Medicaid Other | Admitting: Certified Registered Nurse Anesthetist

## 2022-10-06 ENCOUNTER — Encounter (HOSPITAL_COMMUNITY)
Admission: RE | Disposition: A | Discharge status: Home / Self Care | Payer: Self-pay | Source: Home / Self Care | Attending: Orthopaedic Surgery

## 2022-10-06 ENCOUNTER — Inpatient Hospital Stay (HOSPITAL_COMMUNITY): Payer: Medicaid Other

## 2022-10-06 ENCOUNTER — Encounter (HOSPITAL_COMMUNITY): Payer: Self-pay | Admitting: Orthopaedic Surgery

## 2022-10-06 ENCOUNTER — Inpatient Hospital Stay (HOSPITAL_COMMUNITY)
Admission: RE | Admit: 2022-10-06 | Discharge: 2022-10-11 | DRG: 468 | Disposition: A | Discharge status: Home / Self Care | Payer: Medicaid Other | Attending: Orthopaedic Surgery | Admitting: Orthopaedic Surgery

## 2022-10-06 ENCOUNTER — Inpatient Hospital Stay (HOSPITAL_COMMUNITY): Payer: Medicaid Other | Admitting: Physician Assistant

## 2022-10-06 DIAGNOSIS — T84011D Broken internal left hip prosthesis, subsequent encounter: Secondary | ICD-10-CM

## 2022-10-06 DIAGNOSIS — M109 Gout, unspecified: Secondary | ICD-10-CM | POA: Diagnosis present

## 2022-10-06 DIAGNOSIS — Z833 Family history of diabetes mellitus: Secondary | ICD-10-CM

## 2022-10-06 DIAGNOSIS — Z87891 Personal history of nicotine dependence: Secondary | ICD-10-CM | POA: Diagnosis not present

## 2022-10-06 DIAGNOSIS — Z96642 Presence of left artificial hip joint: Secondary | ICD-10-CM | POA: Diagnosis not present

## 2022-10-06 DIAGNOSIS — Z7984 Long term (current) use of oral hypoglycemic drugs: Secondary | ICD-10-CM | POA: Diagnosis not present

## 2022-10-06 DIAGNOSIS — Y831 Surgical operation with implant of artificial internal device as the cause of abnormal reaction of the patient, or of later complication, without mention of misadventure at the time of the procedure: Secondary | ICD-10-CM | POA: Diagnosis present

## 2022-10-06 DIAGNOSIS — Z96649 Presence of unspecified artificial hip joint: Secondary | ICD-10-CM

## 2022-10-06 DIAGNOSIS — Z01818 Encounter for other preprocedural examination: Secondary | ICD-10-CM

## 2022-10-06 DIAGNOSIS — T8452XA Infection and inflammatory reaction due to internal left hip prosthesis, initial encounter: Principal | ICD-10-CM | POA: Diagnosis present

## 2022-10-06 DIAGNOSIS — Z96641 Presence of right artificial hip joint: Secondary | ICD-10-CM | POA: Diagnosis not present

## 2022-10-06 DIAGNOSIS — E119 Type 2 diabetes mellitus without complications: Secondary | ICD-10-CM | POA: Diagnosis not present

## 2022-10-06 DIAGNOSIS — Z471 Aftercare following joint replacement surgery: Secondary | ICD-10-CM | POA: Diagnosis not present

## 2022-10-06 DIAGNOSIS — S72002A Fracture of unspecified part of neck of left femur, initial encounter for closed fracture: Secondary | ICD-10-CM | POA: Diagnosis not present

## 2022-10-06 DIAGNOSIS — Z8619 Personal history of other infectious and parasitic diseases: Secondary | ICD-10-CM | POA: Diagnosis not present

## 2022-10-06 DIAGNOSIS — T84091A Other mechanical complication of internal left hip prosthesis, initial encounter: Secondary | ICD-10-CM | POA: Diagnosis not present

## 2022-10-06 DIAGNOSIS — T84011A Broken internal left hip prosthesis, initial encounter: Secondary | ICD-10-CM

## 2022-10-06 DIAGNOSIS — R918 Other nonspecific abnormal finding of lung field: Secondary | ICD-10-CM | POA: Diagnosis present

## 2022-10-06 HISTORY — PX: ANTERIOR HIP REVISION: SHX6527

## 2022-10-06 LAB — CBC
HCT: 31.2 % — ABNORMAL LOW (ref 39.0–52.0)
Hemoglobin: 10.1 g/dL — ABNORMAL LOW (ref 13.0–17.0)
MCH: 31.9 pg (ref 26.0–34.0)
MCHC: 32.4 g/dL (ref 30.0–36.0)
MCV: 98.4 fL (ref 80.0–100.0)
Platelets: 104 10*3/uL — ABNORMAL LOW (ref 150–400)
RBC: 3.17 MIL/uL — ABNORMAL LOW (ref 4.22–5.81)
RDW: 14.7 % (ref 11.5–15.5)
WBC: 13.6 10*3/uL — ABNORMAL HIGH (ref 4.0–10.5)
nRBC: 0 % (ref 0.0–0.2)

## 2022-10-06 LAB — GLUCOSE, CAPILLARY
Glucose-Capillary: 128 mg/dL — ABNORMAL HIGH (ref 70–99)
Glucose-Capillary: 164 mg/dL — ABNORMAL HIGH (ref 70–99)
Glucose-Capillary: 275 mg/dL — ABNORMAL HIGH (ref 70–99)
Glucose-Capillary: 206 mg/dL — ABNORMAL HIGH (ref 70–99)

## 2022-10-06 LAB — TYPE AND SCREEN
ABO/RH(D): O POS
Antibody Screen: POSITIVE
Donor AG Type: NEGATIVE
Donor AG Type: NEGATIVE
Unit division: 0
Unit division: 0

## 2022-10-06 LAB — BPAM RBC
Blood Product Expiration Date: 202402202359
Blood Product Expiration Date: 202402202359
Unit Type and Rh: 5100
Unit Type and Rh: 5100

## 2022-10-06 SURGERY — REVISION, TOTAL ARTHROPLASTY, HIP, ANTERIOR APPROACH
Anesthesia: General | Site: Hip | Laterality: Left

## 2022-10-06 MED ORDER — MIDAZOLAM HCL 2 MG/2ML IJ SOLN
INTRAMUSCULAR | Status: AC
  Filled 2022-10-06: qty 2

## 2022-10-06 MED ORDER — ALBUMIN HUMAN 5 % IV SOLN
INTRAVENOUS | Status: AC
  Administered 2022-10-06: 12.5 g via INTRAVENOUS
  Filled 2022-10-06: qty 250

## 2022-10-06 MED ORDER — ACETAMINOPHEN 10 MG/ML IV SOLN
INTRAVENOUS | Status: DC | PRN
  Administered 2022-10-06: 1000 mg via INTRAVENOUS

## 2022-10-06 MED ORDER — ONDANSETRON HCL 4 MG/2ML IJ SOLN
INTRAMUSCULAR | Status: AC
  Filled 2022-10-06: qty 2

## 2022-10-06 MED ORDER — ALBUMIN HUMAN 5 % IV SOLN
INTRAVENOUS | Status: AC
  Filled 2022-10-06: qty 500

## 2022-10-06 MED ORDER — FENTANYL CITRATE (PF) 100 MCG/2ML IJ SOLN
INTRAMUSCULAR | Status: AC
  Filled 2022-10-06: qty 2

## 2022-10-06 MED ORDER — INSULIN ASPART 100 UNIT/ML IJ SOLN
0.0000 [IU] | Freq: Three times a day (TID) | INTRAMUSCULAR | Status: DC
  Administered 2022-10-06: 5 [IU] via SUBCUTANEOUS
  Administered 2022-10-07: 2 [IU] via SUBCUTANEOUS
  Administered 2022-10-07: 3 [IU] via SUBCUTANEOUS
  Administered 2022-10-07: 2 [IU] via SUBCUTANEOUS
  Administered 2022-10-08 (×2): 3 [IU] via SUBCUTANEOUS
  Administered 2022-10-08: 2 [IU] via SUBCUTANEOUS
  Administered 2022-10-09: 3 [IU] via SUBCUTANEOUS
  Administered 2022-10-09: 2 [IU] via SUBCUTANEOUS
  Administered 2022-10-09: 3 [IU] via SUBCUTANEOUS
  Administered 2022-10-10: 2 [IU] via SUBCUTANEOUS
  Administered 2022-10-10: 3 [IU] via SUBCUTANEOUS
  Administered 2022-10-10 – 2022-10-11 (×2): 2 [IU] via SUBCUTANEOUS

## 2022-10-06 MED ORDER — DOCUSATE SODIUM 100 MG PO CAPS
100.0000 mg | ORAL_CAPSULE | Freq: Two times a day (BID) | ORAL | Status: DC
  Administered 2022-10-06 – 2022-10-11 (×10): 100 mg via ORAL
  Filled 2022-10-06 (×10): qty 1

## 2022-10-06 MED ORDER — OXYCODONE HCL 5 MG PO TABS
5.0000 mg | ORAL_TABLET | ORAL | Status: DC | PRN
  Administered 2022-10-06 – 2022-10-11 (×18): 10 mg via ORAL
  Filled 2022-10-06 (×19): qty 2

## 2022-10-06 MED ORDER — ORAL CARE MOUTH RINSE: 15.0000 mL | Freq: Once | OROMUCOSAL | Status: AC

## 2022-10-06 MED ORDER — ALBUMIN HUMAN 5 % IV SOLN: 12.5000 g | Freq: Once | INTRAVENOUS | Status: AC

## 2022-10-06 MED ORDER — KETAMINE HCL 10 MG/ML IJ SOLN
INTRAMUSCULAR | Status: DC | PRN
  Administered 2022-10-06 (×2): 10 mg via INTRAVENOUS
  Administered 2022-10-06: 30 mg via INTRAVENOUS

## 2022-10-06 MED ORDER — PROPOFOL 10 MG/ML IV BOLUS
INTRAVENOUS | Status: DC | PRN
  Administered 2022-10-06: 130 mg via INTRAVENOUS

## 2022-10-06 MED ORDER — POVIDONE-IODINE 10 % EX SWAB
2.0000 | Freq: Once | CUTANEOUS | Status: AC
  Administered 2022-10-06: 2 via TOPICAL

## 2022-10-06 MED ORDER — ONDANSETRON HCL 4 MG/2ML IJ SOLN: 4.0000 mg | Freq: Four times a day (QID) | INTRAMUSCULAR | Status: DC | PRN

## 2022-10-06 MED ORDER — INSULIN ASPART 100 UNIT/ML IJ SOLN
0.0000 [IU] | Freq: Every day | INTRAMUSCULAR | Status: DC
  Administered 2022-10-06: 2 [IU] via SUBCUTANEOUS

## 2022-10-06 MED ORDER — KETOTIFEN FUMARATE 0.035 % OP SOLN: 1.0000 [drp] | Freq: Every day | OPHTHALMIC | Status: DC | PRN

## 2022-10-06 MED ORDER — LORATADINE 10 MG PO TABS: 10.0000 mg | ORAL_TABLET | Freq: Every day | ORAL | Status: DC | PRN

## 2022-10-06 MED ORDER — ONDANSETRON HCL 4 MG/2ML IJ SOLN: 4.0000 mg | Freq: Once | INTRAMUSCULAR | Status: DC | PRN

## 2022-10-06 MED ORDER — SODIUM CHLORIDE 0.9 % IR SOLN
Status: DC | PRN
  Administered 2022-10-06: 3000 mL

## 2022-10-06 MED ORDER — METHOCARBAMOL 500 MG PO TABS
500.0000 mg | ORAL_TABLET | Freq: Four times a day (QID) | ORAL | Status: DC | PRN
  Administered 2022-10-06 – 2022-10-11 (×7): 500 mg via ORAL
  Filled 2022-10-06 (×7): qty 1

## 2022-10-06 MED ORDER — VANCOMYCIN HCL IN DEXTROSE 1-5 GM/200ML-% IV SOLN
1000.0000 mg | Freq: Two times a day (BID) | INTRAVENOUS | Status: AC
  Administered 2022-10-06: 1000 mg via INTRAVENOUS
  Filled 2022-10-06: qty 200

## 2022-10-06 MED ORDER — ALBUMIN HUMAN 5 % IV SOLN: INTRAVENOUS | Status: DC | PRN

## 2022-10-06 MED ORDER — PHENOL 1.4 % MT LIQD: 1.0000 | OROMUCOSAL | Status: DC | PRN

## 2022-10-06 MED ORDER — PHENYLEPHRINE HCL-NACL 20-0.9 MG/250ML-% IV SOLN
INTRAVENOUS | Status: DC | PRN
  Administered 2022-10-06: 40 ug/min via INTRAVENOUS

## 2022-10-06 MED ORDER — HYDROMORPHONE HCL 1 MG/ML IJ SOLN
INTRAMUSCULAR | Status: AC
  Administered 2022-10-06: 0.5 mg via INTRAVENOUS
  Filled 2022-10-06: qty 1

## 2022-10-06 MED ORDER — KETAMINE HCL 50 MG/5ML IJ SOSY
PREFILLED_SYRINGE | INTRAMUSCULAR | Status: AC
  Filled 2022-10-06: qty 5

## 2022-10-06 MED ORDER — TRANEXAMIC ACID-NACL 1000-0.7 MG/100ML-% IV SOLN
1000.0000 mg | INTRAVENOUS | Status: DC
  Filled 2022-10-06: qty 100

## 2022-10-06 MED ORDER — MIDAZOLAM HCL 5 MG/5ML IJ SOLN
INTRAMUSCULAR | Status: DC | PRN
  Administered 2022-10-06: 2 mg via INTRAVENOUS

## 2022-10-06 MED ORDER — POLYETHYLENE GLYCOL 3350 17 G PO PACK: 17.0000 g | PACK | Freq: Every day | ORAL | Status: DC | PRN

## 2022-10-06 MED ORDER — DIPHENHYDRAMINE HCL 12.5 MG/5ML PO ELIX: 12.5000 mg | ORAL_SOLUTION | ORAL | Status: DC | PRN

## 2022-10-06 MED ORDER — LIDOCAINE 2% (20 MG/ML) 5 ML SYRINGE
INTRAMUSCULAR | Status: DC | PRN
  Administered 2022-10-06: 100 mg via INTRAVENOUS

## 2022-10-06 MED ORDER — LACTATED RINGERS IV SOLN: INTRAVENOUS | Status: DC

## 2022-10-06 MED ORDER — OXYCODONE HCL 5 MG/5ML PO SOLN: 5.0000 mg | Freq: Once | ORAL | Status: DC | PRN

## 2022-10-06 MED ORDER — VANCOMYCIN HCL 1000 MG IV SOLR
INTRAVENOUS | Status: DC | PRN
  Administered 2022-10-06: 1000 mg via INTRAVENOUS

## 2022-10-06 MED ORDER — ACETAMINOPHEN 325 MG PO TABS
325.0000 mg | ORAL_TABLET | Freq: Four times a day (QID) | ORAL | Status: DC | PRN
  Administered 2022-10-09: 650 mg via ORAL
  Administered 2022-10-10: 325 mg via ORAL
  Administered 2022-10-11: 650 mg via ORAL
  Filled 2022-10-06: qty 1
  Filled 2022-10-06 (×2): qty 2

## 2022-10-06 MED ORDER — ALUM & MAG HYDROXIDE-SIMETH 200-200-20 MG/5ML PO SUSP: 30.0000 mL | ORAL | Status: DC | PRN

## 2022-10-06 MED ORDER — VANCOMYCIN HCL 1000 MG IV SOLR
INTRAVENOUS | Status: AC
  Filled 2022-10-06: qty 20

## 2022-10-06 MED ORDER — PHENYLEPHRINE 80 MCG/ML (10ML) SYRINGE FOR IV PUSH (FOR BLOOD PRESSURE SUPPORT)
PREFILLED_SYRINGE | INTRAVENOUS | Status: DC | PRN
  Administered 2022-10-06 (×6): 160 ug via INTRAVENOUS

## 2022-10-06 MED ORDER — DEXAMETHASONE SODIUM PHOSPHATE 10 MG/ML IJ SOLN
INTRAMUSCULAR | Status: AC
  Filled 2022-10-06: qty 1

## 2022-10-06 MED ORDER — PANTOPRAZOLE SODIUM 40 MG PO TBEC
40.0000 mg | DELAYED_RELEASE_TABLET | Freq: Every day | ORAL | Status: DC
  Administered 2022-10-06 – 2022-10-11 (×6): 40 mg via ORAL
  Filled 2022-10-06 (×6): qty 1

## 2022-10-06 MED ORDER — HYDROMORPHONE HCL 1 MG/ML IJ SOLN
0.2500 mg | INTRAMUSCULAR | Status: DC | PRN
  Administered 2022-10-06: 0.25 mg via INTRAVENOUS
  Administered 2022-10-06: 0.5 mg via INTRAVENOUS
  Administered 2022-10-06: 0.25 mg via INTRAVENOUS

## 2022-10-06 MED ORDER — HYDROMORPHONE HCL 2 MG PO TABS: 2.0000 mg | ORAL_TABLET | ORAL | Status: DC | PRN

## 2022-10-06 MED ORDER — ALBUTEROL SULFATE (2.5 MG/3ML) 0.083% IN NEBU: 3.0000 mL | INHALATION_SOLUTION | RESPIRATORY_TRACT | Status: DC | PRN

## 2022-10-06 MED ORDER — SODIUM CHLORIDE 0.9 % IV SOLN: INTRAVENOUS | Status: DC

## 2022-10-06 MED ORDER — PROPOFOL 10 MG/ML IV BOLUS
INTRAVENOUS | Status: AC
  Filled 2022-10-06: qty 20

## 2022-10-06 MED ORDER — ROCURONIUM BROMIDE 10 MG/ML (PF) SYRINGE
PREFILLED_SYRINGE | INTRAVENOUS | Status: DC | PRN
  Administered 2022-10-06: 20 mg via INTRAVENOUS
  Administered 2022-10-06: 80 mg via INTRAVENOUS
  Administered 2022-10-06: 10 mg via INTRAVENOUS

## 2022-10-06 MED ORDER — ACETAMINOPHEN 10 MG/ML IV SOLN
INTRAVENOUS | Status: AC
  Filled 2022-10-06: qty 100

## 2022-10-06 MED ORDER — SUGAMMADEX SODIUM 200 MG/2ML IV SOLN
INTRAVENOUS | Status: DC | PRN
  Administered 2022-10-06: 200 mg via INTRAVENOUS

## 2022-10-06 MED ORDER — GABAPENTIN 300 MG PO CAPS
300.0000 mg | ORAL_CAPSULE | Freq: Two times a day (BID) | ORAL | Status: DC
  Administered 2022-10-06 – 2022-10-11 (×10): 300 mg via ORAL
  Filled 2022-10-06 (×10): qty 1

## 2022-10-06 MED ORDER — METHOCARBAMOL 500 MG IVPB - SIMPLE MED
500.0000 mg | Freq: Four times a day (QID) | INTRAVENOUS | Status: DC | PRN
  Administered 2022-10-06: 500 mg via INTRAVENOUS
  Filled 2022-10-06: qty 55

## 2022-10-06 MED ORDER — VANCOMYCIN HCL 1000 MG IV SOLR
INTRAVENOUS | Status: DC | PRN
  Administered 2022-10-06: 1000 mg via TOPICAL

## 2022-10-06 MED ORDER — METOCLOPRAMIDE HCL 5 MG PO TABS: 5.0000 mg | ORAL_TABLET | Freq: Three times a day (TID) | ORAL | Status: DC | PRN

## 2022-10-06 MED ORDER — PROPOFOL 1000 MG/100ML IV EMUL
INTRAVENOUS | Status: AC
  Filled 2022-10-06: qty 100

## 2022-10-06 MED ORDER — VANCOMYCIN HCL IN DEXTROSE 1-5 GM/200ML-% IV SOLN
INTRAVENOUS | Status: AC
  Filled 2022-10-06: qty 200

## 2022-10-06 MED ORDER — METOCLOPRAMIDE HCL 5 MG/ML IJ SOLN: 5.0000 mg | Freq: Three times a day (TID) | INTRAMUSCULAR | Status: DC | PRN

## 2022-10-06 MED ORDER — METFORMIN HCL 500 MG PO TABS
500.0000 mg | ORAL_TABLET | Freq: Two times a day (BID) | ORAL | Status: DC
  Administered 2022-10-06 – 2022-10-11 (×10): 500 mg via ORAL
  Filled 2022-10-06 (×10): qty 1

## 2022-10-06 MED ORDER — METHOCARBAMOL 500 MG IVPB - SIMPLE MED
INTRAVENOUS | Status: AC
  Filled 2022-10-06: qty 55

## 2022-10-06 MED ORDER — OXYCODONE HCL 5 MG PO TABS: 5.0000 mg | ORAL_TABLET | Freq: Once | ORAL | Status: DC | PRN

## 2022-10-06 MED ORDER — ONDANSETRON HCL 4 MG/2ML IJ SOLN
INTRAMUSCULAR | Status: DC | PRN
  Administered 2022-10-06: 4 mg via INTRAVENOUS

## 2022-10-06 MED ORDER — CHLORHEXIDINE GLUCONATE 0.12 % MT SOLN
15.0000 mL | Freq: Once | OROMUCOSAL | Status: AC
  Administered 2022-10-06: 15 mL via OROMUCOSAL

## 2022-10-06 MED ORDER — ASPIRIN 81 MG PO CHEW
81.0000 mg | CHEWABLE_TABLET | Freq: Two times a day (BID) | ORAL | Status: DC
  Administered 2022-10-06 – 2022-10-11 (×10): 81 mg via ORAL
  Filled 2022-10-06 (×10): qty 1

## 2022-10-06 MED ORDER — FENTANYL CITRATE (PF) 100 MCG/2ML IJ SOLN
INTRAMUSCULAR | Status: DC | PRN
  Administered 2022-10-06 (×5): 50 ug via INTRAVENOUS

## 2022-10-06 MED ORDER — ONDANSETRON HCL 4 MG PO TABS: 4.0000 mg | ORAL_TABLET | Freq: Four times a day (QID) | ORAL | Status: DC | PRN

## 2022-10-06 MED ORDER — MENTHOL 3 MG MT LOZG: 1.0000 | LOZENGE | OROMUCOSAL | Status: DC | PRN

## 2022-10-06 MED ORDER — HYDROMORPHONE HCL 1 MG/ML IJ SOLN: 0.5000 mg | INTRAMUSCULAR | Status: DC | PRN

## 2022-10-06 MED ORDER — ROCURONIUM BROMIDE 10 MG/ML (PF) SYRINGE
PREFILLED_SYRINGE | INTRAVENOUS | Status: AC
  Filled 2022-10-06: qty 20

## 2022-10-06 MED ORDER — DEXAMETHASONE SODIUM PHOSPHATE 10 MG/ML IJ SOLN
INTRAMUSCULAR | Status: DC | PRN
  Administered 2022-10-06: 4 mg via INTRAVENOUS

## 2022-10-06 SURGICAL SUPPLY — 43 items
BAG COUNTER SPONGE SURGICOUNT (BAG) IMPLANT
BAG ZIPLOCK 12X15 (MISCELLANEOUS) IMPLANT
BENZOIN TINCTURE PRP APPL 2/3 (GAUZE/BANDAGES/DRESSINGS) ×1 IMPLANT
BLADE SAW SGTL 18X1.27X75 (BLADE) ×1 IMPLANT
CELLS DAT CNTRL 66122 CELL SVR (MISCELLANEOUS) IMPLANT
CLOTH BEACON ORANGE TIMEOUT ST (SAFETY) ×1 IMPLANT
COVER PERINEAL POST (MISCELLANEOUS) ×1 IMPLANT
COVER SURGICAL LIGHT HANDLE (MISCELLANEOUS) ×1 IMPLANT
CUP ACET PNNCL SECTR W/GRIP 56 (Hips) IMPLANT
DRAPE STERI IOBAN 125X83 (DRAPES) ×1 IMPLANT
DRAPE U-SHAPE 47X51 STRL (DRAPES) ×2 IMPLANT
DRESSING AQUACEL AG SP 3.5X10 (GAUZE/BANDAGES/DRESSINGS) IMPLANT
DRSG AQUACEL AG ADV 3.5X10 (GAUZE/BANDAGES/DRESSINGS) ×1 IMPLANT
DRSG AQUACEL AG SP 3.5X10 (GAUZE/BANDAGES/DRESSINGS) ×1
DURAPREP 26ML APPLICATOR (WOUND CARE) ×1 IMPLANT
ELECT REM PT RETURN 15FT ADLT (MISCELLANEOUS) ×1 IMPLANT
GAUZE XEROFORM 1X8 LF (GAUZE/BANDAGES/DRESSINGS) ×1 IMPLANT
GLOVE BIO SURGEON STRL SZ7.5 (GLOVE) ×2 IMPLANT
GLOVE ECLIPSE 8.0 STRL XLNG CF (GLOVE) ×1 IMPLANT
GLOVE INDICATOR 8.0 STRL GRN (GLOVE) ×2 IMPLANT
HANDPIECE INTERPULSE COAX TIP (DISPOSABLE) ×1
HEAD FEMUR METAL 36MM 15.6 HIP (Head) IMPLANT
HOLDER FOLEY CATH W/STRAP (MISCELLANEOUS) ×1 IMPLANT
KIT TURNOVER KIT A (KITS) IMPLANT
METAL FEMUR HEAD 36MM 15.6 HIP (Head) ×1 IMPLANT
PACK ANTERIOR HIP CUSTOM (KITS) ×1 IMPLANT
PINN SECTOR W/GRIP ACE CUP 56 (Hips) ×1 IMPLANT
PINNACLE ALTRX PLUS 4 N 36X56 (Hips) IMPLANT
RETRACTOR WND ALEXIS 18 MED (MISCELLANEOUS) IMPLANT
RTRCTR WOUND ALEXIS 18CM MED (MISCELLANEOUS)
SCREW 6.5MMX25MM (Screw) IMPLANT
SET HNDPC FAN SPRY TIP SCT (DISPOSABLE) ×1 IMPLANT
STEM FEM ACTIS HIGH SZ7 (Stem) IMPLANT
STRIP CLOSURE SKIN 1/2X4 (GAUZE/BANDAGES/DRESSINGS) ×1 IMPLANT
SUT ETHIBOND NAB CT1 #1 30IN (SUTURE) IMPLANT
SUT MNCRL AB 4-0 PS2 18 (SUTURE) ×1 IMPLANT
SUT VIC AB 0 CT1 36 (SUTURE) ×1 IMPLANT
SUT VIC AB 1 CT1 36 (SUTURE) ×3 IMPLANT
SUT VIC AB 2-0 CT1 27 (SUTURE) ×2
SUT VIC AB 2-0 CT1 TAPERPNT 27 (SUTURE) ×2 IMPLANT
TRAY FOLEY MTR SLVR 16FR STAT (SET/KITS/TRAYS/PACK) ×1 IMPLANT
WATER STERILE IRR 1000ML POUR (IV SOLUTION) ×1 IMPLANT
YANKAUER SUCT BULB TIP NO VENT (SUCTIONS) ×1 IMPLANT

## 2022-10-06 NOTE — Transfer of Care (Signed)
Immediate Anesthesia Transfer of Care Note  Patient: Joshua Cole  Procedure(s) Performed: REMOVAL OF LEFT HIP ANTIBIOTIC SPACER, LEFT TOTAL HIP REVISION ARTHROPLASTY (Left: Hip)  Patient Location: PACU  Anesthesia Type:General  Level of Consciousness: drowsy and patient cooperative  Airway & Oxygen Therapy: Patient Spontanous Breathing and Patient connected to face mask oxygen  Post-op Assessment: Report given to RN and Post -op Vital signs reviewed and stable  Post vital signs: Reviewed and stable  Last Vitals:  Vitals Value Taken Time  BP 93/67 10/06/22 1045  Temp    Pulse 79 10/06/22 1047  Resp 15 10/06/22 1047  SpO2 99 % 10/06/22 1047  Vitals shown include unvalidated device data.  Last Pain:  Vitals:   10/06/22 0556  TempSrc:   PainSc: 5          Complications: No notable events documented.

## 2022-10-06 NOTE — Anesthesia Postprocedure Evaluation (Signed)
Anesthesia Post Note  Patient: Joshua Cole  Procedure(s) Performed: REMOVAL OF LEFT HIP ANTIBIOTIC SPACER, LEFT TOTAL HIP REVISION ARTHROPLASTY (Left: Hip)     Patient location during evaluation: PACU Anesthesia Type: General Level of consciousness: awake and alert Pain management: pain level controlled Vital Signs Assessment: post-procedure vital signs reviewed and stable Respiratory status: spontaneous breathing, nonlabored ventilation, respiratory function stable and patient connected to nasal cannula oxygen Cardiovascular status: blood pressure returned to baseline and stable Postop Assessment: no apparent nausea or vomiting Anesthetic complications: no  No notable events documented.  Last Vitals:  Vitals:   10/06/22 1315 10/06/22 1330  BP: 129/85 (!) 145/77  Pulse: 87 85  Resp: 17 13  Temp:    SpO2: 99% 99%    Last Pain:  Vitals:   10/06/22 1045  TempSrc:   PainSc: 8                  Lian Pounds,[name]

## 2022-10-06 NOTE — Op Note (Signed)
Operative Note  Date of operation: 10/06/2022 Preoperative diagnosis: #1 history of left total hip infection status post excision arthroplasty and placement of antibiotic spacer, #2 failed left total hip arthroplasty Postoperative diagnosis: Same  Procedure: Second stage revision left hip with removal of antibiotic spacer, irrigation and debridement of left hip soft tissue and bone, revision arthroplasty of all components  Implants: DePuy sector GRIPTION # opponent size 36 with a single acetabular dome screw, 36+4 polythene liner, size 7 Actis femoral component with high offset, 36+15.5 metal head ball  Surgeon: Joshua Cole. Joshua Linden, MD Assistant: Joshua Stabile, PA-C  Anesthesia: General EBL: 1300 cc Antibiotics: 1 g IV vancomycin after intraoperative cultures Complications: None  Indications: The patient is a 62 year old gentleman who presented over a year ago with bacteremia.  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  He had a history of bilateral hip replacements done in [place] sometime prior.  He had then moved to [facility].  He was treated by the infectious disease service for his bacteremia.  This eventually seeded his left hip.  In September of this past year we took him to the operating room and removed all components.  We irrigated the hip and placed a temporary antibiotic spacer.  He was on 6 weeks antibiotics as well.  Is now been 4-1/2 months since that surgery and has been off all antibiotics.  An aspiration of the hip was negative.  All infectious parameters labs were normalized.  At this point he is presenting for a revision arthroplasty.  He understands there is a significantly high risk of acute blood loss anemia, nerve vessel injury, fracture, dislocation, implant failure, leg length differences and wound healing issues.  There is also significant continue risk of infection.  He understands her goals are hopefully improve mobility and  overall proved quality of life.  Procedure scription after informed sent obtain appropriate left hip was marked he was brought to the operating room and general anesthesia was stabilized on a stretcher.  Traction boots were then placed on both his feet he is placed supine on the Hana fracture table with a perineal post in place in both legs and inline skeletal traction vices no traction applied.  We then assessed both hips radiographically.  Timeout was called and he was notified is correct patient correct left hip.  Again has been prepped and draped in DuraPrep and sterile drapes.  We then made incision through his previous anterior hip incision and carried this just a little bit more proximally and distally.  Dissection was carried down to the soft tissue and the tensor fascia on the lateral area.  We certainly did develop encountered abundant scar tissue and was able to eventually get down to the joint itself.  There was no gross evidence of infection but we did send off intraoperative cultures from deep tissue around the hip itself.  After some time we were able to dislocate the hip and remove the femoral component chipping away at all of the bone cement was removed the acetabular component as well.  We also took deep cultures from behind the acetabular opponent.  Once we redo this we then initiated reaming of the acetabulum.  This was performed under direct visitation and direct fluoroscopy.  I was able to ream and stepwise increments up to a size 55 reamer and we also assessed this reamer under direct fluoroscopy so we can obtain our depth reaming and her inclination as well as her  anteversion.  We then placed the real DePuy sector GRIPTION acetabular component size 56 and had a nice and tight fit but I still placed 1 acetabular dome screw.  We then went with a 36+4 polythene liner based off offset.  Attention was then turned to the femur.  We removed all the cement debris from down the canal.  We then began  broaching using the Actis broaching system from a size 0 going related to a size 7.  I did have a nice tight fit and was slightly proud that we knew we will have to extend him significant leg lengths.  We trialed a 36+5 head ball and reduce his [facility] the final components we need to be much longer but we had a good fit of the canal in terms of the femoral component.  We then removed all the trial components.  We irrigated the soft tissue with antibiotic solution and let that stay in the hip before placing final implants.  We then irrigated all the tissue out and placed the real Actis femoral component size 7 with high offset we went with a long spell that we could with a 36+15.5 hip ball reducing the acetabulum.  We assessed it radiographically and clinically it was felt to be stable.  We then irrigated the cyst soft tissue with 3 L of normal saline solution using pulsatile lavage.  We dried the solution well and then placed the antibiotic solution in the hip joint itself and let that sit for a minute.  We irrigated that fluid out and then placed vancomycin powder around the arthrotomy and the joint components themselves.  We then closed the deep tissue with #1 Ethibond suture followed by #1 Vicryl to close the tensor fascia 0 Vicryl used to close deep tissue and 2-0 Vicryl used to close subcutaneous tissue.  The skin was closed with staples.  Operative dressing was applied.  The patient was taken off the Hana table.  In and out catheterization was performed.  He was awakened extubated and taken recovery in stable addition.  All final counts were correct and no complications noted.  Joshua Stabile, PA-C's assistance was medically necessary during the entire case and beginning to end for assisting with soft tissue retraction, helping guide implant placement and a layered closure of the wound.  In the PACU will obtain a new CBC given his significant acute blood loss anemia during the case.  He did remain dynamically  stable during the case.

## 2022-10-06 NOTE — Addendum Note (Signed)
Addendum  created 10/06/22 1719 by West Pugh, CRNA   Flowsheet accepted

## 2022-10-06 NOTE — Anesthesia Preprocedure Evaluation (Signed)
Anesthesia Evaluation  Patient identified by MRN, date of birth, ID band Patient awake    Reviewed: Allergy & Precautions, H&P , NPO status , Patient's Chart, lab work & pertinent test results  Airway Mallampati: II  TM Distance: >3 FB Neck ROM: Full    Dental no notable dental hx.    Pulmonary neg pulmonary ROS, former smoker   Pulmonary exam normal breath sounds clear to auscultation       Cardiovascular negative cardio ROS Normal cardiovascular exam Rhythm:Regular Rate:Normal     Neuro/Psych negative neurological ROS  negative psych ROS   GI/Hepatic Neg liver ROS,GERD  ,,  Endo/Other  diabetes    Renal/GU negative Renal ROS  negative genitourinary   Musculoskeletal negative musculoskeletal ROS (+)    Abdominal   Peds negative pediatric ROS (+)  Hematology negative hematology ROS (+)   Anesthesia Other Findings   Reproductive/Obstetrics negative OB ROS                             Anesthesia Physical Anesthesia Plan  ASA: 3  Anesthesia Plan: General   Post-op Pain Management: Ofirmev IV (intra-op)*   Induction: Intravenous  PONV Risk Score and Plan: 2 and Ondansetron, Dexamethasone and Treatment may vary due to age or medical condition  Airway Management Planned: Oral ETT  Additional Equipment:   Intra-op Plan:   Post-operative Plan: Extubation in OR  Informed Consent: I have reviewed the patients History and Physical, chart, labs and discussed the procedure including the risks, benefits and alternatives for the proposed anesthesia with the patient or authorized representative who has indicated his/her understanding and acceptance.     Dental advisory given  Plan Discussed with: CRNA and Surgeon  Anesthesia Plan Comments:        Anesthesia Quick Evaluation

## 2022-10-06 NOTE — Anesthesia Procedure Notes (Signed)
Procedure Name: Intubation Date/Time: 10/06/2022 7:31 AM  Performed by: West Pugh, CRNAPre-anesthesia Checklist: Patient identified, Emergency Drugs available, Suction available, Patient being monitored and Timeout performed Patient Re-evaluated:Patient Re-evaluated prior to induction Oxygen Delivery Method: Circle system utilized Preoxygenation: Pre-oxygenation with 100% oxygen Induction Type: IV induction Ventilation: Mask ventilation without difficulty Laryngoscope Size: Mac and 4 Grade View: Grade I Tube type: Oral Tube size: 7.5 mm Number of attempts: 1 Airway Equipment and Method: Stylet Placement Confirmation: ETT inserted through vocal cords under direct vision, positive ETCO2, CO2 detector and breath sounds checked- equal and bilateral Secured at: 22 cm Tube secured with: Tape Dental Injury: Teeth and Oropharynx as per pre-operative assessment

## 2022-10-06 NOTE — Interval H&P Note (Signed)
History and Physical Interval Note: The patient understands that he is here today for her for a revision arthroplasty from a previously infected left total hip replacement.  He currently has an antibiotic spacer and the goal is to remove the spacer and assess the bone and soft tissue for residual infection.  If the environment appears healthy, the plan is to revise him to a new total hip arthroplasty.  The risks and benefits of surgery have been explained in detail and informed consent is obtained.  The left operative hip has been marked.  10/06/2022 7:18 AM  Joshua Cole  has presented today for surgery, with the diagnosis of history of infected left total hip arthroplasty.  The various methods of treatment have been discussed with the patient and family. After consideration of risks, benefits and other options for treatment, the patient has consented to  Procedure(s): REMOVAL OF LEFT HIP ANTIBIOTIC SPACER, LEFT TOTAL HIP REVISION ARTHROPLASTY (Left) as a surgical intervention.  The patient's history has been reviewed, patient examined, no change in status, stable for surgery.  I have reviewed the patient's chart and labs.  Questions were answered to the patient's satisfaction.     Mcarthur Rossetti

## 2022-10-07 ENCOUNTER — Encounter (HOSPITAL_COMMUNITY): Payer: Self-pay | Admitting: Orthopaedic Surgery

## 2022-10-07 LAB — CBC
HCT: 27.8 % — ABNORMAL LOW (ref 39.0–52.0)
Hemoglobin: 9.2 g/dL — ABNORMAL LOW (ref 13.0–17.0)
MCH: 32.2 pg (ref 26.0–34.0)
MCHC: 33.1 g/dL (ref 30.0–36.0)
MCV: 97.2 fL (ref 80.0–100.0)
Platelets: 98 10*3/uL — ABNORMAL LOW (ref 150–400)
RBC: 2.86 MIL/uL — ABNORMAL LOW (ref 4.22–5.81)
RDW: 14.6 % (ref 11.5–15.5)
WBC: 9.8 10*3/uL (ref 4.0–10.5)
nRBC: 0 % (ref 0.0–0.2)

## 2022-10-07 LAB — BASIC METABOLIC PANEL
Anion gap: 7 (ref 5–15)
BUN: 10 mg/dL (ref 6–20)
CO2: 23 mmol/L (ref 22–32)
Calcium: 8.2 mg/dL — ABNORMAL LOW (ref 8.9–10.3)
Chloride: 104 mmol/L (ref 98–111)
Creatinine, Ser: 0.62 mg/dL (ref 0.61–1.24)
GFR, Estimated: >60 mL/min (ref >60)
Glucose, Bld: 131 mg/dL — ABNORMAL HIGH (ref 70–99)
Potassium: 4.6 mmol/L (ref 3.5–5.1)
Sodium: 134 mmol/L — ABNORMAL LOW (ref 135–145)

## 2022-10-07 LAB — GLUCOSE, CAPILLARY
Glucose-Capillary: 129 mg/dL — ABNORMAL HIGH (ref 70–99)
Glucose-Capillary: 146 mg/dL — ABNORMAL HIGH (ref 70–99)
Glucose-Capillary: 166 mg/dL — ABNORMAL HIGH (ref 70–99)
Glucose-Capillary: 178 mg/dL — ABNORMAL HIGH (ref 70–99)

## 2022-10-07 NOTE — Progress Notes (Signed)
Physical Therapy Treatment Patient Details Name: Joshua Cole MRN: [id number] DOB: November 27, 1960 Today's Date: 10/07/2022  Clinical Impression  Pt seen POD1 for second of two sessions, received in recliner with NT taking vitals, reporting 3/10 pain in L hip. Reviewed anterior and posterior hip precautions verbally, pt able to report 75% of the movement restrictions, can recall the rest with cuing. Pt required min assist +2 for transfers and very short distance ambulation at EOB; pt with improved LLE movement (able to lift L foot off floor during OOB mobility) but distance limited by pain and fatigue. Reviewed very small range of motion exercises in supine including heel slides, emphasized keeping movement small and only in supine, pt verbalized understanding. Pt able to maintain A&P precautions during mobility. We will continue to follow acutely.      10/07/22 1434  PT Visit Information  Last PT Received On 10/07/22  Assistance Needed +2  History of Present Illness Pt is a 62yo male presenting s/p second stage L-THA revision (removal of antibiotic spacer, I&D of soft tissue and bone, revision of all components). Of note, first stage pt had excisional L-THA with antibiotic spacers on 05/2022 secondary to infection. PMH: CKD, DM, gout, bilateral THA, L-RCR with subacromial decompression 2023, endocarditis, neuropathy.  Subjective Data  Subjective I feel good, ready to come back to bed  Patient Stated Goal To walk better  Precautions  Precautions Anterior Hip;Posterior Hip;Fall  Precaution Booklet Issued Yes (comment)  Precaution Comments Anterior and Posterior precautions, no strengthening exercises, hip flexion <90.  Restrictions  Weight Bearing Restrictions Yes  LLE Weight Bearing WBAT  Other Position/Activity Restrictions Anterior hip precautions, Posterior hip precautions  Pain Assessment  Pain Assessment 0-10  Pain Location Left hip  Pain Descriptors / Indicators Operative site  guarding;Discomfort  Pain Intervention(s) Limited activity within patient's tolerance;Monitored during session;Repositioned;Ice applied  Cognition  Arousal/Alertness Awake/alert  Behavior During Therapy WFL for tasks assessed/performed  Overall Cognitive Status Within Functional Limits for tasks assessed  Bed Mobility  Overal bed mobility Needs Assistance  Bed Mobility Sit to Supine  Sit to supine Mod assist;+2 for physical assistance;+2 for safety/equipment  General bed mobility comments Pt required mod assist +2 for bringing BLE into bed while lowering trunk to maintain hip A&P precautions. Pt able to self-scoot up in supine using BUE and RLE.  Transfers  Overall transfer level Needs assistance  Equipment used Rolling walker (2 wheels)  Transfers Sit to/from Stand;Bed to chair/wheelchair/BSC  Sit to Stand Min assist;+2 safety/equipment;From elevated surface  General transfer comment Sit to stand: Pt required min assist for steadying with VCs for sequencing and maintenance of precautions.  Ambulation/Gait  Ambulation/Gait assistance Min guard;+2 safety/equipment  Gait Distance (Feet) 3 Feet  Assistive device Rolling walker (2 wheels)  Gait Pattern/deviations Step-to pattern;Decreased step length - left;Decreased stance time - left;Decreased dorsiflexion - left;Decreased weight shift to left  General Gait Details Pt ambulated at EOB forward and then backwards with constant VCs to maintain precautions especially posterior to adjust positioning in bed, pt able to lift L foot off ground and did not have to advance foot with gait belt. Distance limited by fatigue and pt desire to return to bed.  Gait velocity decreased  Balance  Overall balance assessment Needs assistance  Sitting-balance support No upper extremity supported;Feet supported  Sitting balance-Leahy Scale Good  Standing balance support Reliant on assistive device for balance;During functional activity;Bilateral upper extremity  supported  Standing balance-Leahy Scale Poor  Exercises  Exercises Total Joint  Total  Joint Exercises  Ankle Circles/Pumps AROM;Both;20 reps  Heel Slides AROM;Left;Supine;Other reps (comment) (2, VERY small ROM (approximately 30deg hip flexion, emphasized importance of keeping ROM low and only performing exercise in supine)  PT - End of Session  Equipment Utilized During Treatment Gait belt  Activity Tolerance Patient limited by pain;Patient limited by fatigue  Patient left with call bell/phone within reach;in bed;with bed alarm set  Nurse Communication Mobility status   PT - Assessment/Plan  PT Plan Current plan remains appropriate  PT Visit Diagnosis Difficulty in walking, not elsewhere classified (R26.2);Pain  Pain - Right/Left Left  Pain - part of body Hip  PT Frequency (ACUTE ONLY) 7X/week  Follow Up Recommendations Follow physician's recommendations for discharge plan and follow up therapies  Assistance recommended at discharge Frequent or constant Supervision/Assistance  Patient can return home with the following A lot of help with walking and/or transfers;A lot of help with bathing/dressing/bathroom;Assistance with cooking/housework;Help with stairs or ramp for entrance;Assist for transportation  PT equipment None recommended by PT  AM-PAC PT "6 Clicks" Mobility Outcome Measure (Version 2)  Help needed turning from your back to your side while in a flat bed without using bedrails? 3  Help needed moving from lying on your back to sitting on the side of a flat bed without using bedrails? 2  Help needed moving to and from a bed to a chair (including a wheelchair)? 2  Help needed standing up from a chair using your arms (e.g., wheelchair or bedside chair)? 2  Help needed to walk in hospital room? 2  Help needed climbing 3-5 steps with a railing?  1  6 Click Score 12  Consider Recommendation of Discharge To: CIR/SNF/LTACH  Progressive Mobility  What is the highest level of  mobility based on the progressive mobility assessment? Level 3 (Stands with assist) - Balance while standing  and cannot march in place  Activity Stood at bedside;Transferred from chair to bed;Ambulated with assistance in room  PT Goal Progression  Progress towards PT goals Progressing toward goals  Acute Rehab PT Goals  PT Goal Formulation With patient  Time For Goal Achievement 10/14/22  Potential to Achieve Goals Good  PT Time Calculation  PT Start Time (ACUTE ONLY) 1414  PT Stop Time (ACUTE ONLY) 1430  PT Time Calculation (min) (ACUTE ONLY) 16 min  PT General Charges  $$ ACUTE PT VISIT 1 Visit  PT Treatments  $Therapeutic Activity 8-22 mins   Coolidge Breeze, PT, DPT [facility] Department Office: [phone] Weekend pager: [phone]

## 2022-10-07 NOTE — Plan of Care (Signed)
  Problem: Education: Goal: Ability to describe self-care measures that may prevent or decrease complications (Diabetes Survival Skills Education) will improve Outcome: Progressing   Problem: Education: Goal: Knowledge of the prescribed therapeutic regimen will improve Outcome: Progressing   Problem: Activity: Goal: Ability to avoid complications of mobility impairment will improve Outcome: Progressing   Problem: Pain Management: Goal: Pain level will decrease with appropriate interventions Outcome: Progressing

## 2022-10-07 NOTE — TOC Transition Note (Signed)
Transition of Care [facility]) - CM/SW Discharge Note   Patient Details  Name: Joshua Cole MRN: [id number] Date of Birth: Oct 14, 1960  [facility] CM/SW Contact:  Lennart Pall, LCSW Phone Number: 10/07/2022, 3:14 PM   Clinical Narrative:    Met with pt who confirms he has needed DME at home.  Plan for OPPT that is to be arranged by MD office.  No further TOC needs.   Final next level of care: OP Rehab Barriers to Discharge: No Barriers Identified   Patient Goals and CMS Choice      Discharge Placement                         Discharge Plan and Services Additional resources added to the After Visit Summary for                  DME Arranged: N/A DME Agency: NA                  Social Determinants of Health (SDOH) Interventions SDOH Screenings   Food Insecurity: No Food Insecurity (10/06/2022)  Housing: Low Risk  (10/06/2022)  Transportation Needs: No Transportation Needs (10/06/2022)  Utilities: Not At Risk (10/06/2022)  Depression (PHQ2-9): Low Risk  (03/31/2022)  Tobacco Use: Medium Risk (10/07/2022)     Readmission Risk Interventions    10/07/2022    3:13 PM  Readmission Risk Prevention Plan  Post Dischage Appt Complete  Medication Screening Complete  Transportation Screening Complete

## 2022-10-07 NOTE — Evaluation (Signed)
Physical Therapy Evaluation Patient Details Name: Joshua Cole MRN: [id number] DOB: 1960/12/20 Today's Date: 10/07/2022  History of Present Illness  Pt is a 62yo male presenting s/p second stage L-THA revision (removal of antibiotic spacer, I&D of soft tissue and bone, revision of all components). Of note, first stage pt had excisional L-THA with antibiotic spacers on 05/2022 secondary to infection. PMH: CKD, DM, gout, bilateral THA, L-RCR with subacromial decompression 2023, endocarditis, neuropathy.   Clinical Impression  Joshua Cole is a 62 y.o. male POD1 s/p the procedure above; pt has had multiple procedures on this joint. Patient reports modified independence using primarily RW with mobility at baseline. Provided handout on Anterior hip precautions and posterior hip precautions and reviewed these with the pt prior to mobility, pt verbalized understanding and reported he used gait belt to assist bed mobility with LLE at baseline. Patient is now limited by functional impairments (see PT problem list below) and requires max assist +2 for bed mobility and min assist +2 for transfers. Patient was unable to ambulate at this time as he could not lift L foot off the floor, pt utilized gait belt to move LLE forward. Patient instructed in exercise to facilitate ROM and circulation to manage edema. Encouraged incentive spirometry Patient will benefit from continued skilled PT interventions to address impairments and progress towards PLOF. Acute PT will follow to progress mobility and stair training in preparation for safe discharge to the appropriate venue.       Recommendations for follow up therapy are one component of a multi-disciplinary discharge planning process, led by the attending physician.  Recommendations may be updated based on patient status, additional functional criteria and insurance authorization.  Follow Up Recommendations Follow physician's recommendations for discharge plan and  follow up therapies      Assistance Recommended at Discharge Frequent or constant Supervision/Assistance  Patient can return home with the following  A lot of help with walking and/or transfers;A lot of help with bathing/dressing/bathroom;Assistance with cooking/housework;Help with stairs or ramp for entrance;Assist for transportation    Equipment Recommendations None recommended by PT  Recommendations for Other Services       Functional Status Assessment Patient has had a recent decline in their functional status and demonstrates the ability to make significant improvements in function in a reasonable and predictable amount of time.     Precautions / Restrictions Precautions Precautions: Anterior Hip;Posterior Hip;Fall Precaution Booklet Issued: Yes (comment) Precaution Comments: Anterior and Posterior precautions, no strengthening exercises, hip flexion <90. Restrictions Weight Bearing Restrictions: Yes LLE Weight Bearing: Weight bearing as tolerated Other Position/Activity Restrictions: Anterior hip precautions, Posterior hip precautions      Mobility  Bed Mobility Overal bed mobility: Needs Assistance Bed Mobility: Supine to Sit     Supine to sit: Max assist, +2 for physical assistance, +2 for safety/equipment     General bed mobility comments: Pt required max assist +2 for "helicopter" method to observe A&P precautions secnodary to pt's LLE weakness.    Transfers Overall transfer level: Needs assistance Equipment used: Rolling walker (2 wheels) Transfers: Sit to/from Stand, Bed to chair/wheelchair/BSC Sit to Stand: Min assist, +2 safety/equipment, From elevated surface   Step pivot transfers: Min assist, +2 safety/equipment       General transfer comment: Sit to stand: min assist for steadying from elevated bed height to observe precautions. Pt able to WB on LLE but reporting high degree of pain. Step pivot: pt unable to pick up L foot off floor so utilized gait  belt to self-assist, pt switching L hand from RW grip to gait belt and back. Elevated recliner seat height and reclined back to maintain precautions, pt min assist for controlled lower and keeping back upright. Further mobility deferred    Ambulation/Gait               General Gait Details: deferred  Stairs            Wheelchair Mobility    Modified Rankin (Stroke Patients Only)       Balance Overall balance assessment: Needs assistance Sitting-balance support: No upper extremity supported, Feet supported Sitting balance-Leahy Scale: Good     Standing balance support: Reliant on assistive device for balance, During functional activity, Bilateral upper extremity supported Standing balance-Leahy Scale: Poor                               Pertinent Vitals/Pain Pain Assessment Pain Assessment: 0-10 Pain Score: 10-Worst pain ever Pain Location: Left hip Pain Descriptors / Indicators: Operative site guarding, Discomfort Pain Intervention(s): Limited activity within patient's tolerance, Monitored during session, Repositioned, Ice applied    Home Living Family/patient expects to be discharged to:: Private residence Living Arrangements: Spouse/significant other Joshua Cole) Available Help at Discharge: Family;Available PRN/intermittently Joshua Cole is off M/Tues, daughter is close by but has health issues) Type of Home: House Home Access: Stairs to enter Entrance Stairs-Rails: Right;Left (Crutches) Entrance Stairs-Number of Steps: 3   Home Layout: One level Home Equipment: Conservation officer, nature (2 wheels);Cane - single point;Adaptive equipment;Wheelchair - manual;Crutches;Shower seat      Prior Function Prior Level of Function : Independent/Modified Independent;Driving             Mobility Comments: Uses mobility scooter at grocery store, crutches on stairs, uses RW around the houses ADLs Comments: Joshua Cole helps with donning/doffing shoes and socks      Hand Dominance   Dominant Hand: Right    Extremity/Trunk Assessment   Upper Extremity Assessment Upper Extremity Assessment: RUE deficits/detail;LUE deficits/detail RUE Deficits / Details: Right shoulder is "not good" but better than the left LUE Deficits / Details: June 2023 rotator cuff repair, completed 6 weeks of therapy but had to stop to allow for spacer placement in L hip    Lower Extremity Assessment Lower Extremity Assessment: RLE deficits/detail;LLE deficits/detail RLE Deficits / Details: MMT ank DF/PF 5/5 RLE Sensation: WNL LLE Deficits / Details: MMT ank DF/PF 4/5. LLE Sensation: WNL    Cervical / Trunk Assessment Cervical / Trunk Assessment: Normal  Communication   Communication: No difficulties  Cognition Arousal/Alertness: Awake/alert Behavior During Therapy: WFL for tasks assessed/performed Overall Cognitive Status: Within Functional Limits for tasks assessed                                          General Comments      Exercises Total Joint Exercises Ankle Circles/Pumps: AROM, Both, 20 reps   Assessment/Plan    PT Assessment Patient needs continued PT services  PT Problem List Decreased strength;Decreased range of motion;Decreased activity tolerance;Decreased balance;Decreased mobility;Decreased coordination;Pain       PT Treatment Interventions DME instruction;Gait training;Stair training;Functional mobility training;Therapeutic activities;Therapeutic exercise;Balance training;Neuromuscular re-education;Patient/family education    PT Goals (Current goals can be found in the Care Plan section)  Acute Rehab PT Goals Patient Stated Goal: To walk better PT Goal Formulation: With patient Time  For Goal Achievement: 10/14/22 Potential to Achieve Goals: Good    Frequency 7X/week     Co-evaluation               AM-PAC PT "6 Clicks" Mobility  Outcome Measure Help needed turning from your back to your side while in a  flat bed without using bedrails?: A Little Help needed moving from lying on your back to sitting on the side of a flat bed without using bedrails?: Total Help needed moving to and from a bed to a chair (including a wheelchair)?: Total Help needed standing up from a chair using your arms (e.g., wheelchair or bedside chair)?: A Little Help needed to walk in hospital room?: A Lot Help needed climbing 3-5 steps with a railing? : Total 6 Click Score: 11    End of Session Equipment Utilized During Treatment: Gait belt Activity Tolerance: Patient limited by pain Patient left: in chair;with call bell/phone within reach Nurse Communication: Mobility status PT Visit Diagnosis: Difficulty in walking, not elsewhere classified (R26.2);Pain Pain - Right/Left: Left Pain - part of body: Hip    Time: 9784-7841 PT Time Calculation (min) (ACUTE ONLY): 29 min   Charges:   PT Evaluation $PT Eval Moderate Complexity: 1 Mod PT Treatments $Therapeutic Activity: 8-22 mins        Coolidge Breeze, PT, DPT [facility] Department Office: [phone] Weekend pager: [phone]  Coolidge Breeze 10/07/2022, 1:38 PM

## 2022-10-08 LAB — CBC
HCT: 27.8 % — ABNORMAL LOW (ref 39.0–52.0)
Hemoglobin: 9.1 g/dL — ABNORMAL LOW (ref 13.0–17.0)
MCH: 32.2 pg (ref 26.0–34.0)
MCHC: 32.7 g/dL (ref 30.0–36.0)
MCV: 98.2 fL (ref 80.0–100.0)
Platelets: 101 10*3/uL — ABNORMAL LOW (ref 150–400)
RBC: 2.83 MIL/uL — ABNORMAL LOW (ref 4.22–5.81)
RDW: 14.7 % (ref 11.5–15.5)
WBC: 14.1 10*3/uL — ABNORMAL HIGH (ref 4.0–10.5)
nRBC: 0 % (ref 0.0–0.2)

## 2022-10-08 LAB — GLUCOSE, CAPILLARY
Glucose-Capillary: 127 mg/dL — ABNORMAL HIGH (ref 70–99)
Glucose-Capillary: 158 mg/dL — ABNORMAL HIGH (ref 70–99)
Glucose-Capillary: 126 mg/dL — ABNORMAL HIGH (ref 70–99)

## 2022-10-08 NOTE — Plan of Care (Signed)
  Problem: Education: Goal: Knowledge of the prescribed therapeutic regimen will improve Outcome: Progressing   Problem: Activity: Goal: Ability to avoid complications of mobility impairment will improve Outcome: Progressing   Problem: Pain Management: Goal: Pain level will decrease with appropriate interventions Outcome: Progressing

## 2022-10-08 NOTE — Plan of Care (Signed)
  Problem: Coping: Goal: Ability to adjust to condition or change in health will improve Outcome: Progressing   Problem: Pain Management: Goal: Pain level will decrease with appropriate interventions Outcome: Progressing

## 2022-10-08 NOTE — Progress Notes (Signed)
Physical Therapy Treatment Patient Details Name: Joshua Cole MRN: [id number] DOB: June 06, 1961 Today's Date: 10/08/2022   History of Present Illness Pt is a 62yo male presenting s/p second stage L-THA revision (removal of antibiotic spacer, I&D of soft tissue and bone, revision of all components). Of note, first stage pt had excisional L-THA with antibiotic spacers on 05/2022 secondary to infection. PMH: CKD, DM, gout, bilateral THA, L-RCR with subacromial decompression 2023, endocarditis, neuropathy.    PT Comments    POD # 2 am session General Comments: AxO x 3 familiar from prior admits.  Assisted OOB to amb was VERY difficult.  General bed mobility comments: Increased time and use of belt with instructions ro avoid "big" movements while guiding L LE off bed.  General transfer comment: good use of B UE's for support/control   Pt plans to sleep in his recliner initially just because getting in/OOB is very difficult.  General Gait Details: very limited amb distance due to increased pain with activiating hip flexors.  Pt using a belt around foot to aide in advancing L LE.  Pt was only able to advance 4 feet. Performed a few ISOMETRIC TE's of AP, knee presses, towel squeezes and gluteal squeezes. Pt plans to D/C to home next day or so.   Recommendations for follow up therapy are one component of a multi-disciplinary discharge planning process, led by the attending physician.  Recommendations may be updated based on patient status, additional functional criteria and insurance authorization.  Follow Up Recommendations  Follow physician's recommendations for discharge plan and follow up therapies     Assistance Recommended at Discharge Frequent or constant Supervision/Assistance  Patient can return home with the following A lot of help with walking and/or transfers;A lot of help with bathing/dressing/bathroom;Assistance with cooking/housework;Help with stairs or ramp for entrance;Assist for  transportation   Equipment Recommendations  None recommended by PT    Recommendations for Other Services       Precautions / Restrictions Precautions Precautions: Anterior Hip;Posterior Hip;Fall Precaution Comments: Anterior and Posterior precautions, no ROM strengthening exercises.  MD "wants to establish HIP STABILITY"  did perform some Isometric TE's Restrictions Weight Bearing Restrictions: No LLE Weight Bearing: Weight bearing as tolerated Other Position/Activity Restrictions: Anterior hip precautions, Posterior hip precautions     Mobility  Bed Mobility Overal bed mobility: Needs Assistance Bed Mobility: Supine to Sit           General bed mobility comments: Increased time and use of belt with instructions ro avoid "big" movements while guiding L LE off bed.    Transfers Overall transfer level: Needs assistance Equipment used: Rolling walker (2 wheels) Transfers: Sit to/from Stand Sit to Stand: Min assist, Min guard           General transfer comment: good use of B UE's for support/control   Pt plans to sleep in his recliner initially just because getting in/OOB is very difficult.    Ambulation/Gait Ambulation/Gait assistance: Min assist Gait Distance (Feet): 4 Feet Assistive device: Rolling walker (2 wheels) Gait Pattern/deviations: Step-to pattern, Decreased step length - left, Decreased stance time - left, Decreased dorsiflexion - left, Decreased weight shift to left Gait velocity: decreased     General Gait Details: very limited amb distance due to increased pain with activiating hip flexors.  Pt using a belt around foot to aide in advancing L LE.  Pt was only able to advance 4 feet.   Stairs  Wheelchair Mobility    Modified Rankin (Stroke Patients Only)       Balance                                            Cognition Arousal/Alertness: Awake/alert Behavior During Therapy: WFL for tasks  assessed/performed Overall Cognitive Status: Within Functional Limits for tasks assessed                                 General Comments: AxO x 3 familiar from prior admits        Exercises  10 reps AP, knee presses, towel squeezes and gluteal squeezes Followed by [facility]    General Comments        Pertinent Vitals/Pain Pain Assessment Pain Assessment: 0-10 Pain Score: 8  Pain Location: Left hip Pain Descriptors / Indicators: Operative site guarding, Discomfort, Sore, Tender, Throbbing Pain Intervention(s): Monitored during session, Premedicated before session, Repositioned, Ice applied    Home Living                          Prior Function            PT Goals (current goals can now be found in the care plan section) Progress towards PT goals: Progressing toward goals    Frequency    7X/week      PT Plan Current plan remains appropriate    Co-evaluation              AM-PAC PT "6 Clicks" Mobility   Outcome Measure  Help needed turning from your back to your side while in a flat bed without using bedrails?: A Lot Help needed moving from lying on your back to sitting on the side of a flat bed without using bedrails?: A Lot Help needed moving to and from a bed to a chair (including a wheelchair)?: A Lot Help needed standing up from a chair using your arms (e.g., wheelchair or bedside chair)?: A Lot Help needed to walk in hospital room?: A Lot Help needed climbing 3-5 steps with a railing? : Total 6 Click Score: 11    End of Session Equipment Utilized During Treatment: Gait belt Activity Tolerance: Patient limited by pain;Patient limited by fatigue Patient left: with call bell/phone within reach;in chair;with chair alarm set Nurse Communication: Mobility status PT Visit Diagnosis: Difficulty in walking, not elsewhere classified (R26.2);Pain Pain - Right/Left: Left Pain - part of body: Hip     Time: 0926-0952 PT Time  Calculation (min) (ACUTE ONLY): 26 min  Charges:  $Gait Training: 8-22 mins $Therapeutic Exercise: 8-22 mins                     {Ayrton Mcvay  PTA Acute  [employer] M-F          [phone] Weekend pager [phone]

## 2022-10-08 NOTE — Progress Notes (Signed)
Subjective: 2 Days Post-Op Procedure(s) (LRB): REMOVAL OF LEFT HIP ANTIBIOTIC SPACER, LEFT TOTAL HIP REVISION ARTHROPLASTY (Left) Patient reports pain as moderate.  Slow progress with PT.  Objective: Vital signs in last 24 hours: Temp:  [98.3 F (36.8 C)-99.6 F (37.6 C)] 98.3 F (36.8 C) (01/27 0708) Pulse Rate:  [100-109] 100 (01/27 0708) Resp:  [16-17] 17 (01/27 0708) BP: (107-121)/(55-66) 121/58 (01/27 0708) SpO2:  [96 %-100 %] 97 % (01/27 0708)  Intake/Output from previous day: 01/26 0701 - 01/27 0700 In: 1247.2 [P.O.:1200; I.V.:47.2] Out: 1000 [Urine:1000] Intake/Output this shift: No intake/output data recorded.  Recent Labs    10/06/22 1054 10/07/22 0333 10/08/22 0348  HGB 10.1* 9.2* 9.1*   Recent Labs    10/07/22 0333 10/08/22 0348  WBC 9.8 14.1*  RBC 2.86* 2.83*  HCT 27.8* 27.8*  PLT 98* 101*   Recent Labs    10/07/22 0333  NA 134*  K 4.6  CL 104  CO2 23  BUN 10  CREATININE 0.62  GLUCOSE 131*  CALCIUM 8.2*   No results for input(s): "LABPT", "INR" in the last 72 hours.  Neurovascular intact Sensation intact distally Incision: moderate drainage Compartment soft   Assessment/Plan: 2 Days Post-Op Procedure(s) (LRB): REMOVAL OF LEFT HIP ANTIBIOTIC SPACER, LEFT TOTAL HIP REVISION ARTHROPLASTY (Left) Up with therapy Dressing changed.  Monitor for signs of anemia .     Joshua Cole 10/08/2022, 8:23 AM

## 2022-10-08 NOTE — Progress Notes (Signed)
Physical Therapy Treatment Patient Details Name: Joshua Cole MRN: [id number] DOB: 12/20/1960 Today's Date: 10/08/2022   History of Present Illness Pt is a 62yo male presenting s/p second stage L-THA revision (removal of antibiotic spacer, I&D of soft tissue and bone, revision of all components). Of note, first stage pt had excisional L-THA with antibiotic spacers on 05/2022 secondary to infection. PMH: CKD, DM, gout, bilateral THA, L-RCR with subacromial decompression 2023, endocarditis, neuropathy.    PT Comments    POD # 2 pm session Pt tolerated OOB in recliner from 10 am till 2:45pm Assisted back to bed.  General bed mobility comments: Max Assist to support B LE at same time with pillow between knees to maintain proper alignment and minimize hip pain.  Positioned to comfort and applied ICE. Pt plans to return home next day or two.    Recommendations for follow up therapy are one component of a multi-disciplinary discharge planning process, led by the attending physician.  Recommendations may be updated based on patient status, additional functional criteria and insurance authorization.  Follow Up Recommendations  Follow physician's recommendations for discharge plan and follow up therapies     Assistance Recommended at Discharge Frequent or constant Supervision/Assistance  Patient can return home with the following A lot of help with walking and/or transfers;A lot of help with bathing/dressing/bathroom;Assistance with cooking/housework;Help with stairs or ramp for entrance;Assist for transportation   Equipment Recommendations  None recommended by PT    Recommendations for Other Services       Precautions / Restrictions Precautions Precautions: Anterior Hip;Posterior Hip;Fall Precaution Comments: Anterior and Posterior precautions, no ROM strengthening exercises.  MD "wants to establish HIP STABILITY"  did perform some Isometric TE's Restrictions Weight Bearing  Restrictions: No LLE Weight Bearing: Weight bearing as tolerated Other Position/Activity Restrictions: Anterior hip precautions, Posterior hip precautions     Mobility  Bed Mobility Overal bed mobility: Needs Assistance Bed Mobility: Sit to Supine       Sit to supine: Max assist   General bed mobility comments: Max Assist to support B LE at same time with pillow between knees to maintain proper alignment and minimize hip pain    Transfers Overall transfer level: Needs assistance Equipment used: Rolling walker (2 wheels) Transfers: Sit to/from Stand Sit to Stand: Min assist, Min guard           General transfer comment: good use of B UE's for support/control   Assisted from recliner to bed 1/4 pivot turn with a few steps.    Ambulation/Gait Ambulation/Gait assistance: Min assist Gait Distance (Feet): 4 Feet Assistive device: Rolling walker (2 wheels) Gait Pattern/deviations: Step-to pattern, Decreased step length - left, Decreased stance time - left, Decreased dorsiflexion - left, Decreased weight shift to left Gait velocity: decreased     General Gait Details: transfer back to bed   Stairs             Wheelchair Mobility    Modified Rankin (Stroke Patients Only)       Balance                                            Cognition Arousal/Alertness: Awake/alert Behavior During Therapy: WFL for tasks assessed/performed Overall Cognitive Status: Within Functional Limits for tasks assessed  General Comments: AxO x 3 familiar from prior admits        Exercises      General Comments        Pertinent Vitals/Pain Pain Assessment Pain Assessment: 0-10 Pain Score: 8  Pain Location: Left hip Pain Descriptors / Indicators: Operative site guarding, Discomfort, Sore, Tender, Throbbing Pain Intervention(s): Monitored during session, Premedicated before session, Repositioned, Ice applied     Home Living                          Prior Function            PT Goals (current goals can now be found in the care plan section) Progress towards PT goals: Progressing toward goals    Frequency    7X/week      PT Plan Current plan remains appropriate    Co-evaluation              AM-PAC PT "6 Clicks" Mobility   Outcome Measure  Help needed turning from your back to your side while in a flat bed without using bedrails?: A Lot Help needed moving from lying on your back to sitting on the side of a flat bed without using bedrails?: A Lot Help needed moving to and from a bed to a chair (including a wheelchair)?: A Lot Help needed standing up from a chair using your arms (e.g., wheelchair or bedside chair)?: A Lot Help needed to walk in hospital room?: A Lot Help needed climbing 3-5 steps with a railing? : Total 6 Click Score: 11    End of Session Equipment Utilized During Treatment: Gait belt Activity Tolerance: Patient limited by pain;Patient limited by fatigue Patient left: in bed;with bed alarm set Nurse Communication: Mobility status PT Visit Diagnosis: Difficulty in walking, not elsewhere classified (R26.2);Pain Pain - Right/Left: Left Pain - part of body: Hip     Time: 3570-1779 PT Time Calculation (min) (ACUTE ONLY): 13 min  Charges:  $Therapeutic Activity: 8-22 mins                     {Joshua Cole  PTA Acute  Rehabilitation Services Office M-F          [phone] Weekend pager [phone]

## 2022-10-09 LAB — GLUCOSE, CAPILLARY
Glucose-Capillary: 128 mg/dL — ABNORMAL HIGH (ref 70–99)
Glucose-Capillary: 176 mg/dL — ABNORMAL HIGH (ref 70–99)
Glucose-Capillary: 151 mg/dL — ABNORMAL HIGH (ref 70–99)
Glucose-Capillary: 124 mg/dL — ABNORMAL HIGH (ref 70–99)

## 2022-10-09 NOTE — Progress Notes (Signed)
Patient ID: Joshua Cole, male   DOB: Feb 17, 1961, 62 y.o.   MRN: [id number] The patient is awake and alert.  He is eating breakfast.  I was able to review the notes from PT and he is needing significant assistance with his mobility but he reports that he was able to do well at home even with the temporary antibiotic spacer and limited weightbearing.  He prefers not to go to short-term skilled nursing.  His vital signs are stable today and his left hip dressing is clean and dry.  His current insurance will even cover home health therapy.  We will need to keep him here at least 1-2 more days to maximize physical therapy here as an inpatient prior to discharge safely to home.  He understands that as well.  He also agrees with this.

## 2022-10-09 NOTE — Progress Notes (Signed)
PT Cancellation Note  Patient Details Name: Joshua Cole MRN: [id number] DOB: [birth year]   Cancelled Treatment:    Reason Eval/Treat Not Completed: Patient declined, no reason specified (pt declined second PT session this date stating he just got to bed and is very tired. Honest discussion had with pt regarding limited mobility and goal to return home. Discussed concern with pt limited activity tolerance and question his ability to perform daily household tasks of cooking, self care, and mobilizing alone at home. Referenced pt's current status of only ambulating ~16' today and requiring assist for transfers and bed mobility and fatigue after sitting in recliner during day. Will continue to progress pt as able in efforts for safe discharge home.   Verner Mould, DPT Acute Rehabilitation Services Office [phone]  10/09/22 3:18 PM

## 2022-10-09 NOTE — Progress Notes (Signed)
Physical Therapy Treatment Patient Details Name: Joshua Cole MRN: [id number] DOB: December 04, 1960 Today's Date: 10/09/2022   History of Present Illness Pt is a 62yo male presenting s/p second stage L-THA revision (removal of antibiotic spacer, I&D of soft tissue and bone, revision of all components). Of note, first stage pt had excisional L-THA with antibiotic spacers on 05/2022 secondary to infection. PMH: CKD, DM, gout, bilateral THA, L-RCR with subacromial decompression 2023, endocarditis, neuropathy.    PT Comments    Patient making good progress with mobility and required Mod assist with cues to maintain hip precautions for return to bed. Pt reliant on bil UE's to complete power up and able to steady self to stand upright with RW, min guard for safety. Min assist with cues for sequencing knee flexion/dorsiflexion on Lt LE to facilitate limb advancement. Pt improved ability to initiate and complete step as gait progressed, cues needed for position to RW to improve use of UE's to reduce weight on Lt LE in stance phase. EOS reviewed ankle pumps for circulation and ice applied for pain management. Will continue to progress pt as able with goal to return home vs. discharge to SNF rehab.    Recommendations for follow up therapy are one component of a multi-disciplinary discharge planning process, led by the attending physician.  Recommendations may be updated based on patient status, additional functional criteria and insurance authorization.  Follow Up Recommendations  Follow physician's recommendations for discharge plan and follow up therapies     Assistance Recommended at Discharge Frequent or constant Supervision/Assistance  Patient can return home with the following A lot of help with walking and/or transfers;A lot of help with bathing/dressing/bathroom;Assistance with cooking/housework;Help with stairs or ramp for entrance;Assist for transportation   Equipment Recommendations  None  recommended by PT    Recommendations for Other Services       Precautions / Restrictions Precautions Precautions: Anterior Hip;Posterior Hip;Fall Precaution Booklet Issued: Yes (comment) Precaution Comments: Anterior and Posterior precautions, no ROM strengthening exercises.  MD "wants to establish HIP STABILITY"  did perform some Isometric TE's Restrictions Weight Bearing Restrictions: No LLE Weight Bearing: Weight bearing as tolerated Other Position/Activity Restrictions: Anterior hip precautions, Posterior hip precautions     Mobility  Bed Mobility Overal bed mobility: Needs Assistance Bed Mobility: Supine to Sit     Supine to sit: Mod assist, HOB elevated     General bed mobility comments: cues to keep Lt LE in neutral position and maintain hip precautions. Assist to bring Lt LE off EOB fully and cues for scooting hips to edge with posterior trunk lean.    Transfers Overall transfer level: Needs assistance Equipment used: Rolling walker (2 wheels) Transfers: Sit to/from Stand Sit to Stand: Min assist, Min guard           General transfer comment: pt using bil UE from EOB to power up, steady once standing with guarding for safety. Min assist with cues for reach back and trunk position with lower to sit. min assist to control lowering.    Ambulation/Gait Ambulation/Gait assistance: Min assist Gait Distance (Feet): 16 Feet Assistive device: Rolling walker (2 wheels) Gait Pattern/deviations: Step-to pattern, Decreased step length - left, Decreased stance time - left, Decreased dorsiflexion - left, Decreased weight shift to left Gait velocity: decreased     General Gait Details: pt required step by step cues to dorsiflexion Lt foot and flex knee to fecilitate limb advancement. Pt able to do son with encouragement and manual assist on first 2  steps. Cues to keeps walker closer to allow better UE use for pressure reduction on Lt LE in stance phase. pt fatigued after  short bout.   Stairs             Wheelchair Mobility    Modified Rankin (Stroke Patients Only)       Balance Overall balance assessment: Needs assistance Sitting-balance support: No upper extremity supported, Feet supported Sitting balance-Leahy Scale: Good     Standing balance support: Reliant on assistive device for balance, During functional activity, Bilateral upper extremity supported Standing balance-Leahy Scale: Poor                              Cognition Arousal/Alertness: Awake/alert Behavior During Therapy: WFL for tasks assessed/performed Overall Cognitive Status: Within Functional Limits for tasks assessed                                          Exercises Total Joint Exercises Ankle Circles/Pumps: AROM, Both, 20 reps    General Comments        Pertinent Vitals/Pain Pain Assessment Pain Assessment: Faces Faces Pain Scale: Hurts even more Pain Location: Left hip Pain Descriptors / Indicators: Operative site guarding, Discomfort, Sore, Tender, Throbbing Pain Intervention(s): Limited activity within patient's tolerance, Monitored during session, Repositioned, Premedicated before session, Ice applied    Home Living                          Prior Function            PT Goals (current goals can now be found in the care plan section) Acute Rehab PT Goals PT Goal Formulation: With patient Time For Goal Achievement: 10/14/22 Potential to Achieve Goals: Good Progress towards PT goals: Progressing toward goals    Frequency    7X/week      PT Plan Current plan remains appropriate    Co-evaluation              AM-PAC PT "6 Clicks" Mobility   Outcome Measure  Help needed turning from your back to your side while in a flat bed without using bedrails?: A Lot Help needed moving from lying on your back to sitting on the side of a flat bed without using bedrails?: A Lot Help needed moving to and  from a bed to a chair (including a wheelchair)?: A Little Help needed standing up from a chair using your arms (e.g., wheelchair or bedside chair)?: A Little Help needed to walk in hospital room?: A Little Help needed climbing 3-5 steps with a railing? : Total 6 Click Score: 14    End of Session Equipment Utilized During Treatment: Gait belt Activity Tolerance: Patient limited by pain;Patient limited by fatigue Patient left: in chair;with call bell/phone within reach;with chair alarm set Nurse Communication: Mobility status PT Visit Diagnosis: Difficulty in walking, not elsewhere classified (R26.2);Pain Pain - Right/Left: Left Pain - part of body: Hip     Time: 1884-1660 PT Time Calculation (min) (ACUTE ONLY): 24 min  Charges:  $Gait Training: 8-22 mins $Therapeutic Exercise: 8-22 mins                     Verner Mould, DPT Acute Rehabilitation Services Office [phone]  10/09/22 11:42 AM

## 2022-10-09 NOTE — Plan of Care (Signed)
  Problem: Coping: Goal: Ability to adjust to condition or change in health will improve Outcome: Progressing   Problem: Pain Management: Goal: Pain level will decrease with appropriate interventions Outcome: Progressing

## 2022-10-10 LAB — TYPE AND SCREEN
ABO/RH(D): O POS
Antibody Screen: POSITIVE
Donor AG Type: NEGATIVE
Donor AG Type: NEGATIVE
Unit division: 0
Unit division: 0

## 2022-10-10 LAB — GLUCOSE, CAPILLARY
Glucose-Capillary: 164 mg/dL — ABNORMAL HIGH (ref 70–99)
Glucose-Capillary: 139 mg/dL — ABNORMAL HIGH (ref 70–99)
Glucose-Capillary: 135 mg/dL — ABNORMAL HIGH (ref 70–99)
Glucose-Capillary: 167 mg/dL — ABNORMAL HIGH (ref 70–99)
Glucose-Capillary: 150 mg/dL — ABNORMAL HIGH (ref 70–99)

## 2022-10-10 LAB — BPAM RBC
Blood Product Expiration Date: 202402202359
Blood Product Expiration Date: 202402202359
Unit Type and Rh: 5100
Unit Type and Rh: 5100

## 2022-10-10 NOTE — Plan of Care (Signed)
  Problem: Education: Goal: Ability to describe self-care measures that may prevent or decrease complications (Diabetes Survival Skills Education) will improve Outcome: Progressing   Problem: Skin Integrity: Goal: Risk for impaired skin integrity will decrease Outcome: Progressing   Problem: Tissue Perfusion: Goal: Adequacy of tissue perfusion will improve Outcome: Progressing

## 2022-10-10 NOTE — Progress Notes (Signed)
Patient ID: Joshua Cole, male   DOB: 1961-03-05, 62 y.o.   MRN: [id number] The patient remained stable overall.  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  He is making slow progress with therapy and his mobility.  His left hip thus far is stable.  The dressing has scant drainage.  The plan is hopefully to maximize physical therapy today and tomorrow with discharge to home tomorrow afternoon.

## 2022-10-10 NOTE — Progress Notes (Signed)
Physical Therapy Treatment Patient Details Name: Joshua Cole MRN: [id number] DOB: August 17, 1961 Today's Date: 10/10/2022   History of Present Illness Pt is a 62yo male presenting s/p second stage L-THA revision (removal of antibiotic spacer, I&D of soft tissue and bone, revision of all components). Of note, first stage pt had excisional L-THA with antibiotic spacers on 05/2022 secondary to infection. PMH: CKD, DM, gout, bilateral THA, L-RCR with subacromial decompression 2023, endocarditis, neuropathy.    PT Comments    Pt assisted with ambulating and practicing safe stair technique.  Pt reports using crutches years ago for stair negotiation.  Pt encouraged to try RW backwards which he was open to.  Pt educated on backwards technique with RW and provided with handout.  Pt would likely be more safe and steady with RW instead of switching to/from crutches for stairs so encouraged this technique upon d/c. Pt would like to practice stairs again prior to d/c which is anticipated for tomorrow.   Recommendations for follow up therapy are one component of a multi-disciplinary discharge planning process, led by the attending physician.  Recommendations may be updated based on patient status, additional functional criteria and insurance authorization.  Follow Up Recommendations  Follow physician's recommendations for discharge plan and follow up therapies     Assistance Recommended at Discharge Frequent or constant Supervision/Assistance  Patient can return home with the following Assistance with cooking/housework;Help with stairs or ramp for entrance;Assist for transportation;A little help with walking and/or transfers;A little help with bathing/dressing/bathroom   Equipment Recommendations  None recommended by PT    Recommendations for Other Services       Precautions / Restrictions Precautions Precautions: Anterior Hip;Posterior Hip;Fall Precaution Comments: Anterior and Posterior  precautions, no ROM strengthening exercises.  MD "wants to establish HIP STABILITY" Restrictions LLE Weight Bearing: Weight bearing as tolerated     Mobility  Bed Mobility Overal bed mobility: Needs Assistance Bed Mobility: Supine to Sit, Sit to Supine     Supine to sit: Min guard, HOB elevated Sit to supine: Min guard, HOB elevated   General bed mobility comments: cues for maintaining hip precautions, pt utilized gait belt to self assist Lt LE    Transfers Overall transfer level: Needs assistance Equipment used: Rolling walker (2 wheels) Transfers: Sit to/from Stand Sit to Stand: Min guard           General transfer comment: verbal cues for hip precautions, min/guard for safety    Ambulation/Gait Ambulation/Gait assistance: Min guard Gait Distance (Feet): 40 Feet Assistive device: Rolling walker (2 wheels) Gait Pattern/deviations: Step-to pattern, Decreased stance time - left, Antalgic Gait velocity: decreased     General Gait Details: pt limited distance due to fatigue and pain; also performing stairs   Stairs Stairs: Yes Stairs assistance: Min guard Stair Management: Step to pattern, Backwards, With walker Number of Stairs: 2 General stair comments: verbal cues for sequence, safety, RW positioning; pt performed twice and provided with handout   Wheelchair Mobility    Modified Rankin (Stroke Patients Only)       Balance                                            Cognition Arousal/Alertness: Awake/alert Behavior During Therapy: WFL for tasks assessed/performed Overall Cognitive Status: Within Functional Limits for tasks assessed  Exercises Total Joint Exercises Long Arc Quad: AROM, Left, 5 reps, Limitations, Seated Long Arc Quad Limitations: gentle movement of knee as pt reports stiffness with sitting EOB and wanted to "loosen" before ambulating    General Comments         Pertinent Vitals/Pain Pain Assessment Pain Assessment: 0-10 Pain Score: 7  Pain Location: Left hip Pain Descriptors / Indicators: Discomfort, Sore, Tender Pain Intervention(s): Patient requesting pain meds-RN notified, Repositioned, Monitored during session    Home Living                          Prior Function            PT Goals (current goals can now be found in the care plan section) Progress towards PT goals: Progressing toward goals    Frequency    7X/week      PT Plan Current plan remains appropriate    Co-evaluation              AM-PAC PT "6 Clicks" Mobility   Outcome Measure  Help needed turning from your back to your side while in a flat bed without using bedrails?: A Little Help needed moving from lying on your back to sitting on the side of a flat bed without using bedrails?: A Little Help needed moving to and from a bed to a chair (including a wheelchair)?: A Little Help needed standing up from a chair using your arms (e.g., wheelchair or bedside chair)?: A Little Help needed to walk in hospital room?: A Little Help needed climbing 3-5 steps with a railing? : A Little 6 Click Score: 18    End of Session Equipment Utilized During Treatment: Gait belt Activity Tolerance: Patient tolerated treatment well Patient left: in bed;with call bell/phone within reach Nurse Communication: Patient requests pain meds;Mobility status PT Visit Diagnosis: Difficulty in walking, not elsewhere classified (R26.2);Pain Pain - Right/Left: Left Pain - part of body: Hip     Time: 8588-5027 PT Time Calculation (min) (ACUTE ONLY): 17 min  Charges:  $Gait Training: 8-22 mins                    Jannette Spanner PT, DPT Physical Therapist Acute Rehabilitation Services Preferred contact method: Secure Chat Weekend Pager Only: [phone] Office: [place] 10/10/2022, 3:53 PM

## 2022-10-10 NOTE — Progress Notes (Signed)
Physical Therapy Treatment Patient Details Name: Joshua Cole MRN: [id number] DOB: [birth year] Today's Date: 10/10/2022   History of Present Illness Pt is a 62yo male presenting s/p second stage L-THA revision (removal of antibiotic spacer, I&D of soft tissue and bone, revision of all components). Of note, first stage pt had excisional L-THA with antibiotic spacers on 05/2022 secondary to infection. PMH: CKD, DM, gout, bilateral THA, L-RCR with subacromial decompression 2023, endocarditis, neuropathy.    PT Comments    Pt assisted with ambulating in hallway and able to improve distance.  Pt anticipates practicing steps this afternoon as he will discharging home tomorrow.    Recommendations for follow up therapy are one component of a multi-disciplinary discharge planning process, led by the attending physician.  Recommendations may be updated based on patient status, additional functional criteria and insurance authorization.  Follow Up Recommendations  Follow physician's recommendations for discharge plan and follow up therapies     Assistance Recommended at Discharge Frequent or constant Supervision/Assistance  Patient can return home with the following Assistance with cooking/housework;Help with stairs or ramp for entrance;Assist for transportation;A little help with walking and/or transfers;A little help with bathing/dressing/bathroom   Equipment Recommendations  None recommended by PT    Recommendations for Other Services       Precautions / Restrictions Precautions Precautions: Anterior Hip;Posterior Hip;Fall Precaution Comments: Anterior and Posterior precautions, no ROM strengthening exercises.  MD "wants to establish HIP STABILITY" Restrictions LLE Weight Bearing: Weight bearing as tolerated     Mobility  Bed Mobility Overal bed mobility: Needs Assistance Bed Mobility: Supine to Sit     Supine to sit: Min guard, HOB elevated     General bed mobility comments:  cues for maintaining hip precautions, pt utilized gait belt to self assist Lt LE    Transfers Overall transfer level: Needs assistance Equipment used: Rolling walker (2 wheels) Transfers: Sit to/from Stand Sit to Stand: Min guard           General transfer comment: verbal cues for hip precautions, min/guard for safety    Ambulation/Gait Ambulation/Gait assistance: Min guard Gait Distance (Feet): 120 Feet Assistive device: Rolling walker (2 wheels) Gait Pattern/deviations: Step-to pattern, Decreased stance time - left, Antalgic Gait velocity: decreased     General Gait Details: pt reports easier movement today and only required cues for RW positioning, posture; encouraged increasing distance as pt to d/c plan is home   Stairs             Wheelchair Mobility    Modified Rankin (Stroke Patients Only)       Balance                                            Cognition Arousal/Alertness: Awake/alert Behavior During Therapy: WFL for tasks assessed/performed Overall Cognitive Status: Within Functional Limits for tasks assessed                                          Exercises Total Joint Exercises Long Arc Quad: AROM, Left, 5 reps, Limitations, Seated Long Arc Quad Limitations: gentle movement of knee as pt reports stiffness with sitting EOB and wanted to "loosen" before ambulating    General Comments        Pertinent Vitals/Pain Pain Assessment Pain  Assessment: 0-10 Pain Score: 4  Pain Location: Left hip Pain Descriptors / Indicators: Discomfort, Sore, Tender Pain Intervention(s): Repositioned, Premedicated before session, Monitored during session    Home Living                          Prior Function            PT Goals (current goals can now be found in the care plan section) Progress towards PT goals: Progressing toward goals    Frequency    7X/week      PT Plan Current plan remains  appropriate    Co-evaluation              AM-PAC PT "6 Clicks" Mobility   Outcome Measure  Help needed turning from your back to your side while in a flat bed without using bedrails?: A Little Help needed moving from lying on your back to sitting on the side of a flat bed without using bedrails?: A Little Help needed moving to and from a bed to a chair (including a wheelchair)?: A Little Help needed standing up from a chair using your arms (e.g., wheelchair or bedside chair)?: A Little Help needed to walk in hospital room?: A Little Help needed climbing 3-5 steps with a railing? : A Lot 6 Click Score: 17    End of Session Equipment Utilized During Treatment: Gait belt Activity Tolerance: Patient tolerated treatment well Patient left: in chair;with call bell/phone within reach;with chair alarm set Nurse Communication: Mobility status PT Visit Diagnosis: Difficulty in walking, not elsewhere classified (R26.2);Pain Pain - Right/Left: Left Pain - part of body: Hip     Time: 9326-7124 PT Time Calculation (min) (ACUTE ONLY): 16 min  Charges:  $Gait Training: 8-22 mins                     Jannette Spanner PT, DPT Physical Therapist Acute Rehabilitation Services Preferred contact method: Secure Chat Weekend Pager Only: [phone] Office: [place] 10/10/2022, 11:13 AM

## 2022-10-10 NOTE — Plan of Care (Signed)
  Problem: Coping: Goal: Ability to adjust to condition or change in health will improve Outcome: Progressing   Problem: Pain Management: Goal: Pain level will decrease with appropriate interventions Outcome: Progressing

## 2022-10-11 LAB — AEROBIC/ANAEROBIC CULTURE W GRAM STAIN (SURGICAL/DEEP WOUND)
Culture: NO GROWTH
Gram Stain: NONE SEEN

## 2022-10-11 LAB — GLUCOSE, CAPILLARY
Glucose-Capillary: 139 mg/dL — ABNORMAL HIGH (ref 70–99)
Glucose-Capillary: 152 mg/dL — ABNORMAL HIGH (ref 70–99)

## 2022-10-11 MED ORDER — ASPIRIN 81 MG PO CHEW: 81.0000 mg | CHEWABLE_TABLET | Freq: Two times a day (BID) | ORAL | 0 refills | Status: DC

## 2022-10-11 MED ORDER — OXYCODONE HCL 5 MG PO TABS: 5.0000 mg | ORAL_TABLET | Freq: Four times a day (QID) | ORAL | 0 refills | Status: DC | PRN

## 2022-10-11 MED ORDER — METHOCARBAMOL 500 MG PO TABS: 500.0000 mg | ORAL_TABLET | Freq: Four times a day (QID) | ORAL | 1 refills | Status: DC | PRN

## 2022-10-11 NOTE — Discharge Instructions (Signed)

## 2022-10-11 NOTE — Progress Notes (Signed)
Patient ID: Joshua Cole, male   DOB: April 25, 1961, 62 y.o.   MRN: [id number] Doing well overall.  Can be discharged to home today.

## 2022-10-11 NOTE — Plan of Care (Signed)
  Problem: Education: Goal: Ability to describe self-care measures that may prevent or decrease complications (Diabetes Survival Skills Education) will improve Outcome: Progressing Goal: Individualized Educational Video(s) Outcome: Progressing   Problem: Coping: Goal: Ability to adjust to condition or change in health will improve Outcome: Progressing   Problem: Fluid Volume: Goal: Ability to maintain a balanced intake and output will improve Outcome: Progressing   Problem: Health Behavior/Discharge Planning: Goal: Ability to identify and utilize available resources and services will improve Outcome: Progressing Goal: Ability to manage health-related needs will improve Outcome: Progressing   Problem: Metabolic: Goal: Ability to maintain appropriate glucose levels will improve Outcome: Progressing   Problem: Nutritional: Goal: Maintenance of adequate nutrition will improve Outcome: Progressing Goal: Progress toward achieving an optimal weight will improve Outcome: Progressing   Problem: Skin Integrity: Goal: Risk for impaired skin integrity will decrease Outcome: Progressing   Problem: Tissue Perfusion: Goal: Adequacy of tissue perfusion will improve Outcome: Progressing   Problem: Education: Goal: Knowledge of the prescribed therapeutic regimen will improve Outcome: Progressing Goal: Understanding of discharge needs will improve Outcome: Progressing Goal: Individualized Educational Video(s) Outcome: Progressing   Problem: Activity: Goal: Ability to avoid complications of mobility impairment will improve Outcome: Progressing Goal: Ability to tolerate increased activity will improve Outcome: Progressing   Problem: Clinical Measurements: Goal: Postoperative complications will be avoided or minimized Outcome: Progressing   Problem: Pain Management: Goal: Pain level will decrease with appropriate interventions Outcome: Progressing   Problem: Skin  Integrity: Goal: Will show signs of wound healing Outcome: Progressing   Problem: Education: Goal: Knowledge of General Education information will improve Description: Including pain rating scale, medication(s)/side effects and non-pharmacologic comfort measures Outcome: Progressing   Problem: Health Behavior/Discharge Planning: Goal: Ability to manage health-related needs will improve Outcome: Progressing   Problem: Clinical Measurements: Goal: Ability to maintain clinical measurements within normal limits will improve Outcome: Progressing Goal: Will remain free from infection Outcome: Progressing Goal: Diagnostic test results will improve Outcome: Progressing Goal: Respiratory complications will improve Outcome: Progressing Goal: Cardiovascular complication will be avoided Outcome: Progressing   Problem: Activity: Goal: Risk for activity intolerance will decrease Outcome: Progressing   Problem: Nutrition: Goal: Adequate nutrition will be maintained Outcome: Progressing   Problem: Coping: Goal: Level of anxiety will decrease Outcome: Progressing   Problem: Elimination: Goal: Will not experience complications related to bowel motility Outcome: Progressing Goal: Will not experience complications related to urinary retention Outcome: Progressing   Problem: Pain Managment: Goal: General experience of comfort will improve Outcome: Progressing   Problem: Safety: Goal: Ability to remain free from injury will improve Outcome: Progressing   Problem: Skin Integrity: Goal: Risk for impaired skin integrity will decrease Outcome: Progressing

## 2022-10-11 NOTE — Progress Notes (Signed)
Physical Therapy Treatment Patient Details Name: Joshua Cole MRN: [id number] DOB: [birth year] Today's Date: 10/11/2022   History of Present Illness Pt is a 62yo male presenting s/p second stage L-THA revision (removal of antibiotic spacer, I&D of soft tissue and bone, revision of all components). Of note, first stage pt had excisional L-THA with antibiotic spacers on 05/2022 secondary to infection. PMH: CKD, DM, gout, bilateral THA, L-RCR with subacromial decompression 2023, endocarditis, neuropathy.    PT Comments    POD # 5 Pt scheduled to D/C to home today.  Assisted OOB to amb in hallway went well.  General bed mobility comments: using his belt, pt able to self slide L LE off bed with increased time. General transfer comment: self able to rise with good hand placeemnt.  General Gait Details: pt tolerated an increased distance of 115 feet with increased time. General stair comments: using one RIGHT rail and one LEFT cturch, pt was able to safely navigate 3 steps.  "I like using the crutch better" than backward approach yesterday.  Pt does have crutches at home and is familiar with using one on the stairs.  Also assisted with a shower transfer with 25% VC's on proper sequencing in shower back with "good" first and out forward with "bad" first.  Educated on shower safety and energy conservation tech.   Pt has met goals to D/C to home later today.   Recommendations for follow up therapy are one component of a multi-disciplinary discharge planning process, led by the attending physician.  Recommendations may be updated based on patient status, additional functional criteria and insurance authorization.  Follow Up Recommendations  Follow physician's recommendations for discharge plan and follow up therapies     Assistance Recommended at Discharge Frequent or constant Supervision/Assistance  Patient can return home with the following Assistance with cooking/housework;Help with stairs or ramp  for entrance;Assist for transportation;A little help with walking and/or transfers;A little help with bathing/dressing/bathroom   Equipment Recommendations  None recommended by PT    Recommendations for Other Services       Precautions / Restrictions Precautions Precautions: Anterior Hip;Posterior Hip;Fall Precaution Comments: Anterior and Posterior precautions, no ROM strengthening exercises.  MD "wants to establish HIP STABILITY" Restrictions Weight Bearing Restrictions: No LLE Weight Bearing: Weight bearing as tolerated Other Position/Activity Restrictions: Anterior hip precautions, Posterior hip precautions     Mobility  Bed Mobility Overal bed mobility: Needs Assistance Bed Mobility: Supine to Sit     Supine to sit: Supervision     General bed mobility comments: using his belt, pt able to self slide L LE off bed with increased time.    Transfers Overall transfer level: Needs assistance Equipment used: Rolling walker (2 wheels) Transfers: Sit to/from Stand Sit to Stand: Supervision           General transfer comment: self able to rise with good hand placeemnt.  Also assisted with a shower transfer with 25% VC's on proper sequencing in shower back with "good" first and out forward with "bad" first.    Ambulation/Gait Ambulation/Gait assistance: Supervision, Min guard Gait Distance (Feet): 115 Feet Assistive device: Rolling walker (2 wheels) Gait Pattern/deviations: Step-to pattern, Decreased stance time - left, Antalgic Gait velocity: decreased     General Gait Details: pt tolerated an increased distance of 115 feet with increased time.   Stairs Stairs: Yes Stairs assistance: Supervision, Min guard Stair Management: One rail Left, Forwards, With crutches Number of Stairs: 3 General stair comments: using one RIGHT rail and  one LEFT cturch, pt was able to safely navigate 3 steps.  "I like using the crutch better" than backward approach yesterday.  Pt does  have crutches at home and is familiar with using one on the stairs.   Wheelchair Mobility    Modified Rankin (Stroke Patients Only)       Balance                                            Cognition Arousal/Alertness: Awake/alert Behavior During Therapy: WFL for tasks assessed/performed Overall Cognitive Status: Within Functional Limits for tasks assessed                                 General Comments: AxO x 3 familiar from prior admits        Exercises      General Comments        Pertinent Vitals/Pain Pain Assessment Pain Assessment: 0-10 Pain Score: 5  Pain Location: Left hip Pain Descriptors / Indicators: Discomfort, Sore, Tender, Operative site guarding Pain Intervention(s): Monitored during session, Premedicated before session, Repositioned    Home Living                          Prior Function            PT Goals (current goals can now be found in the care plan section) Progress towards PT goals: Progressing toward goals    Frequency    7X/week      PT Plan      Co-evaluation              AM-PAC PT "6 Clicks" Mobility   Outcome Measure  Help needed turning from your back to your side while in a flat bed without using bedrails?: None Help needed moving from lying on your back to sitting on the side of a flat bed without using bedrails?: None Help needed moving to and from a bed to a chair (including a wheelchair)?: None Help needed standing up from a chair using your arms (e.g., wheelchair or bedside chair)?: None Help needed to walk in hospital room?: A Little Help needed climbing 3-5 steps with a railing? : A Little 6 Click Score: 22    End of Session Equipment Utilized During Treatment: Gait belt Activity Tolerance: Patient tolerated treatment well Patient left: in chair;with call bell/phone within reach Nurse Communication: Mobility status (pt has met his mobility goals to D/C to  home) PT Visit Diagnosis: Difficulty in walking, not elsewhere classified (R26.2);Pain Pain - Right/Left: Left Pain - part of body: Hip     Time: 5809-9833 PT Time Calculation (min) (ACUTE ONLY): 40 min  Charges:  $Gait Training: 8-22 mins $Therapeutic Activity: 23-37 mins                     Rica Koyanagi  PTA Acute  Rehabilitation Services Office M-F          [phone] Weekend pager [phone]

## 2022-10-11 NOTE — Discharge Summary (Signed)
Patient ID: Joshua Cole MRN: [id number] DOB/AGE: 1960-11-11 62 y.o.  Admit date: 10/06/2022 Discharge date: 10/11/2022  Admission Diagnoses:  Principal Problem:   Left hip prosthetic joint infection ([place]) Active Problems:   Failure of left total hip arthroplasty [facility])   Status post revision of total hip   Discharge Diagnoses:  Same  Past Medical History:  Diagnosis Date   Arthritis    Asthma    Chronic kidney disease    Diabetes mellitus without complication ([place])    type 2   DJD (degenerative joint disease)    Dysrhythmia    junctional tachycardia and incomplete heart block   Edema of left lower extremity 07/04/2022   Gout    Heart block    Lung nodules    a. 09/2021 CT chest: Interval decrease in size and number of bilateral lung nodules, likely consistent with sequelae associated with an infectious/inflammatory process.   MSSA bacteremia 06/2021   Rotator cuff tear 08/26/2021   Subacute bacterial endocarditis (SBE)    a. 06/2021 TEE: mobile mass attached to the tricuspid valve-->Abx rx-->09/2021 TEE: EF 55-60%, no rwma, nl RV fxn, mild-mod RAE, mild MR, mobile echodense 11x41m mass in the TV apparatus-->conservative rx per TCTS.    Surgeries: Procedure(s): REMOVAL OF LEFT HIP ANTIBIOTIC SPACER, LEFT TOTAL HIP REVISION ARTHROPLASTY on 10/06/2022   Consultants:   Discharged Condition: Improved  Hospital Course: GDajion Bickfordis an 62y.o. male who was admitted 10/06/2022 for operative treatment ofLeft hip prosthetic joint infection ([place]. Patient has severe unremitting pain that affects sleep, daily activities, and work/hobbies. After pre-op clearance the patient was taken to the operating room on 10/06/2022 and underwent  Procedure(s): REMOVAL OF LEFT HIP ANTIBIOTIC SPACER, LEFT TOTAL HIP REVISION ARTHROPLASTY.    Patient was given perioperative antibiotics:  Anti-infectives (From admission, onward)    Start     Dose/Rate Route Frequency Ordered Stop   10/06/22  2100  vancomycin (VANCOCIN) IVPB 1000 mg/200 mL premix        1,000 mg 200 mL/hr over 60 Minutes Intravenous Every 12 hours 10/06/22 1409 10/06/22 2206   10/06/22 0949  vancomycin (VANCOCIN) powder  Status:  Discontinued          As needed 10/06/22 0949 10/06/22 1400   10/06/22 0717  vancomycin (VANCOCIN) 1-5 GM/200ML-% IVPB       Note to Pharmacy: WChristell FaithL: cabinet override      10/06/22 0717 10/06/22 1929        Patient was given sequential compression devices, early ambulation, and chemoprophylaxis to prevent DVT.  Patient benefited maximally from hospital stay and there were no complications.    Recent vital signs: Patient Vitals for the past 24 hrs:  BP Temp Temp src Pulse Resp SpO2  10/11/22 0621 115/66 98.5 F (36.9 C) Oral 93 17 97 %  10/10/22 2118 (!) 119/59 98.5 F (36.9 C) -- 92 17 97 %  10/10/22 1257 122/68 98.5 F (36.9 C) -- 93 18 97 %     Recent laboratory studies: No results for input(s): "WBC", "HGB", "HCT", "PLT", "NA", "K", "CL", "CO2", "BUN", "CREATININE", "GLUCOSE", "INR", "CALCIUM" in the last 72 hours.  Invalid input(s): "PT", "2"   Discharge Medications:   Allergies as of 10/11/2022   No Known Allergies      Medication List     STOP taking these medications    HYDROcodone-acetaminophen 5-325 MG tablet Commonly known as: NORCO/VICODIN       TAKE these medications    acetaminophen  500 MG tablet Commonly known as: TYLENOL Take 500-1,000 mg by mouth in the morning, at noon, and at bedtime. Take 1000 mg by mouth in the morning. Take 500 mg by mouth in the afternoon and at bedtime.   albuterol 108 (90 Base) MCG/ACT inhaler Commonly known as: VENTOLIN HFA Inhale 1-2 puffs into the lungs every 6 (six) hours as needed for wheezing or shortness of breath. What changed:  how much to take when to take this   aspirin 81 MG chewable tablet Chew 1 tablet (81 mg total) by mouth 2 (two) times daily.   furosemide 20 MG tablet Commonly known  as: LASIX Take 1 tablet (20 mg total) by mouth once daily as needed (swelling or weight gain). What changed: reasons to take this   gabapentin 300 MG capsule Commonly known as: NEURONTIN Take 1 capsule (300 mg total) by mouth 2 (two) times daily.   ketotifen 0.025 % ophthalmic solution Commonly known as: ZADITOR Place 1 drop into both eyes daily as needed (allergies).   loratadine 10 MG tablet Commonly known as: CLARITIN Take 10 mg by mouth daily as needed for allergies.   metFORMIN 1000 MG tablet Commonly known as: GLUCOPHAGE Take 0.5 tablets (500 mg total) by mouth 2 (two) times daily with a meal.   methocarbamol 500 MG tablet Commonly known as: ROBAXIN Take 1 tablet (500 mg total) by mouth every 6 (six) hours as needed for muscle spasms.   oxyCODONE 5 MG immediate release tablet Commonly known as: Oxy IR/ROXICODONE Take 1-2 tablets (5-10 mg total) by mouth every 6 (six) hours as needed for moderate pain (pain score 4-6).   Rightest GL300 Lancets Misc USE AS DIRECTED TO CHECK BLOOD SUGAR UP TO 4 TIMES DAILY.   Rightest GS550 Blood Glucose test strip Generic drug: glucose blood USE AS DIRECTED TO CHECK BLOOD SUGAR UP TO 4 TIMES DAILY.               Durable Medical Equipment  (From admission, onward)           Start     Ordered   10/06/22 1410  DME 3 n 1  Once        10/06/22 1409   10/06/22 1410  DME Walker rolling  Once       Question Answer Comment  Walker: With 5 Inch Wheels   Patient needs a walker to treat with the following condition Status post revision of total hip      10/06/22 1409            Diagnostic Studies: DG Pelvis Portable  Result Date: 10/06/2022 CLINICAL DATA:  Status post revision of total hip EXAM: PORTABLE PELVIS 1-2 VIEWS COMPARISON:  05/23/2022, 05/20/2022 FINDINGS: Postsurgical changes of left hip arthroplasty revision. Normal alignment. No evidence of immediate complication. Expected soft tissue changes. Unchanged right  hip arthroplasty. IMPRESSION: Postsurgical changes of left total hip arthroplasty. No evidence of immediate complication. Electronically Signed   By: Joshua Cole M.D.   On: 10/06/2022 11:11   DG HIP UNILAT WITH PELVIS 1V LEFT  Result Date: 10/06/2022 CLINICAL DATA:  Left hip surgery EXAM: DG HIP (WITH OR WITHOUT PELVIS) 1V*L* COMPARISON:  05/23/2022 FINDINGS: Three C-arm fluoroscopic images were obtained intraoperatively and submitted for post operative interpretation. Images demonstrate left total hip arthroplasty hardware in place. Unchanged proximal left femur fracture. 22 seconds fluoroscopy time utilized. Radiation dose: 4.3 mGy. Please see the performing provider's procedural report for further detail. IMPRESSION: Intraoperative fluoroscopic guidance  for left total hip arthroplasty. Electronically Signed   By: Davina Poke D.O.   On: 10/06/2022 10:08   DG C-Arm 1-60 Min-No Report  Result Date: 10/06/2022 Fluoroscopy was utilized by the requesting physician.  No radiographic interpretation.   DG C-Arm 1-60 Min-No Report  Result Date: 10/06/2022 Fluoroscopy was utilized by the requesting physician.  No radiographic interpretation.    Disposition: Discharge disposition: 01-Home or Self Care          Follow-up Information     Mcarthur Rossetti, MD Follow up in 2 week(s).   Specialty: Orthopedic Surgery Contact information: [address] [place] [address] [phone]                  Signed: Mcarthur Rossetti 10/11/2022, 6:57 AM

## 2022-10-13 DIAGNOSIS — Z419 Encounter for procedure for purposes other than remedying health state, unspecified: Secondary | ICD-10-CM | POA: Diagnosis not present

## 2022-10-20 ENCOUNTER — Ambulatory Visit (INDEPENDENT_AMBULATORY_CARE_PROVIDER_SITE_OTHER): Payer: Medicaid Other | Admitting: Orthopaedic Surgery

## 2022-10-20 ENCOUNTER — Encounter: Payer: Self-pay | Admitting: Orthopaedic Surgery

## 2022-10-20 DIAGNOSIS — Z96642 Presence of left artificial hip joint: Secondary | ICD-10-CM

## 2022-10-20 DIAGNOSIS — Z96649 Presence of unspecified artificial hip joint: Secondary | ICD-10-CM

## 2022-10-20 MED ORDER — OXYCODONE HCL 5 MG PO TABS: 5.0000 mg | ORAL_TABLET | Freq: Four times a day (QID) | ORAL | 0 refills | Status: DC | PRN

## 2022-10-20 NOTE — Progress Notes (Signed)
The patient comes in today for his first postoperative visit status post revision arthroplasty which was a two-stage revision from a previous chronically infected left total hip.  His left hip replacement was performed in [place] many years ago.  When he moved to [place] over a year ago he was bacteremic and this seeded the left hip.  We eventually took all the components out and placed a temporary antibiotic spacer.  2 weeks ago remove that spacer since he cleared his infection and placed a total hip arthroplasty.  He is going slow with his mobility and will him to continue to go very slow with her precautions and no exercises.  He is ambulate with a walker.  On exam his incision looks good.  The staples have been removed and Steri-Strips applied.  He does have moderate swelling to be expected but no seroma to drain.  I would like to see him back in 4 weeks to see how he is doing overall.  I would like a standing AP pelvis and lateral of the left hip at that visit since this is a revision situation.  I did refill his pain medication as well which is oxycodone.

## 2022-11-11 DIAGNOSIS — Z419 Encounter for procedure for purposes other than remedying health state, unspecified: Secondary | ICD-10-CM | POA: Diagnosis not present

## 2022-11-16 ENCOUNTER — Ambulatory Visit (INDEPENDENT_AMBULATORY_CARE_PROVIDER_SITE_OTHER): Payer: Medicaid Other

## 2022-11-16 ENCOUNTER — Ambulatory Visit (INDEPENDENT_AMBULATORY_CARE_PROVIDER_SITE_OTHER): Payer: Medicaid Other | Admitting: Orthopaedic Surgery

## 2022-11-16 ENCOUNTER — Encounter: Payer: Self-pay | Admitting: Orthopaedic Surgery

## 2022-11-16 DIAGNOSIS — Z96649 Presence of unspecified artificial hip joint: Secondary | ICD-10-CM

## 2022-11-16 DIAGNOSIS — Z96642 Presence of left artificial hip joint: Secondary | ICD-10-CM

## 2022-11-16 NOTE — Progress Notes (Signed)
HPI: [name] returns today for follow-up left total hip arthroplasty revision 10/06/2022.  He states overall he is doing good.  Has some numbness around the incision.  Denies any fevers chills.  He is having mild discomfort.  Takes Tylenol for the pain.  He continues to take the Neurontin.  Was on 81 mg aspirin prior to surgery, he continues to take.  Review of systems: See HPI otherwise negative  Physical exam: General well-developed well-nourished male who ambulates with cane nonantalgic gait.  Psych alert and oriented x 3 Left hip: Surgical incisions well-healed no signs of infection.  There is slight edema in the area consistent more with a hematoma.  Left calf supple nontender.  Dorsiflexion plantarflexion left ankle intact.  Good range of motion left hip without pain  Radiographs: AP pelvis and lateral view left hip: Left hip well located..  Left hip total arthroplasty components well-seated.  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  He will work on range of motion and hip.  Follow-up with [place] in 1 month to see how he is doing overall no radiographs at that time unless clinically indicated.

## 2022-11-17 ENCOUNTER — Encounter: Payer: Self-pay | Admitting: Radiology

## 2022-12-01 ENCOUNTER — Ambulatory Visit (INDEPENDENT_AMBULATORY_CARE_PROVIDER_SITE_OTHER): Payer: Medicaid Other | Admitting: Physician Assistant

## 2022-12-01 ENCOUNTER — Encounter: Payer: Self-pay | Admitting: Physician Assistant

## 2022-12-01 VITALS — BP 127/69 | HR 87 | Temp 98.4°F | Ht 70.0 in | Wt 232.0 lb

## 2022-12-01 DIAGNOSIS — G629 Polyneuropathy, unspecified: Secondary | ICD-10-CM | POA: Diagnosis not present

## 2022-12-01 DIAGNOSIS — E66811 Obesity, class 1: Secondary | ICD-10-CM

## 2022-12-01 DIAGNOSIS — Z8679 Personal history of other diseases of the circulatory system: Secondary | ICD-10-CM | POA: Diagnosis not present

## 2022-12-01 DIAGNOSIS — E119 Type 2 diabetes mellitus without complications: Secondary | ICD-10-CM

## 2022-12-01 DIAGNOSIS — E0821 Diabetes mellitus due to underlying condition with diabetic nephropathy: Secondary | ICD-10-CM

## 2022-12-01 DIAGNOSIS — Z7689 Persons encountering health services in other specified circumstances: Secondary | ICD-10-CM

## 2022-12-01 DIAGNOSIS — E669 Obesity, unspecified: Secondary | ICD-10-CM

## 2022-12-01 NOTE — Progress Notes (Signed)
New patient visit   Patient: Joshua Cole   DOB: 19-May-1961   62 y.o. Male  MRN: [id number] Visit Date: 12/01/2022  Today's healthcare provider: Mardene Speak, PA-C   Chief Complaint  Patient presents with   New Patient (Initial Visit)   Subjective    Joshua Cole is a 62 y.o. male who presents today as a new patient to establish care.   HPI   Establish--medication refills Last edited by Elta Guadeloupe, CMA on 12/01/2022  2:06 PM.      Patient reports that his old PCP does not accept Medicaid that is why he is trying to establish care with new PCP.  Was seen previously at [facility] medical Patient has diabetes type 2, takes metformin, endorses fair compliance with medication, denies having any side effects.  Per chart review from 08/25/2022 his Basaglar was discontinued as his diabetes was improving with A1c of 6.5%, request a refill Patient endorses having diabetic neuropathy with numbness and tingling sensation in his feet, taking gabapentin with great relief.  He is requesting refill for gabapentin He does not remember the last time he has an eye exam, requests a referral to ophthalmology. Has a history of subacute bacterial endocarditis , see result of TEE from 06/2021 below . Had and MSSA bacteremia as well as a heart block in 2022 Patient would like to establish care with cardiology and requests a referral   Past Medical History:  Diagnosis Date   Arthritis    Asthma    Chronic kidney disease    Diabetes mellitus without complication ([place])    type 2   DJD (degenerative joint disease)    Dysrhythmia    junctional tachycardia and incomplete heart block   Edema of left lower extremity 07/04/2022   Gout    Heart block    Lung nodules    a. 09/2021 CT chest: Interval decrease in size and number of bilateral lung nodules, likely consistent with sequelae associated with an infectious/inflammatory process.   MSSA bacteremia 06/2021   Rotator cuff tear 08/26/2021    Subacute bacterial endocarditis (SBE)    a. 06/2021 TEE: mobile mass attached to the tricuspid valve-->Abx rx-->09/2021 TEE: EF 55-60%, no rwma, nl RV fxn, mild-mod RAE, mild MR, mobile echodense 11x28mm mass in the TV apparatus-->conservative rx per TCTS.   Past Surgical History:  Procedure Laterality Date   ACHILLES TENDON REPAIR Left    many years ago per pt   ANTERIOR HIP REVISION Left 10/06/2022   Procedure: REMOVAL OF LEFT HIP ANTIBIOTIC SPACER, LEFT TOTAL HIP REVISION ARTHROPLASTY;  Surgeon: Mcarthur Rossetti, MD;  Location: [facility];  Service: Orthopedics;  Laterality: Left;   BIOPSY  07/16/2021   Procedure: BIOPSY;  Surgeon: Sharyn Creamer, MD;  Location: [facility] ENDOSCOPY;  Service: Gastroenterology;;  EGD and COLON   COLONOSCOPY N/A 07/16/2021   Procedure: COLONOSCOPY;  Surgeon: Sharyn Creamer, MD;  Location: [place];  Service: Gastroenterology;  Laterality: N/A;   COLONOSCOPY WITH PROPOFOL N/A 07/16/2021   Procedure: COLONOSCOPY WITH PROPOFOL;  Surgeon: Sharyn Creamer, MD;  Location: [place];  Service: Gastroenterology;  Laterality: N/A;   ESOPHAGOGASTRODUODENOSCOPY (EGD) WITH PROPOFOL N/A 07/16/2021   Procedure: ESOPHAGOGASTRODUODENOSCOPY (EGD) WITH PROPOFOL;  Surgeon: Sharyn Creamer, MD;  Location: [place];  Service: Gastroenterology;  Laterality: N/A;   EXCISIONAL TOTAL HIP ARTHROPLASTY WITH ANTIBIOTIC SPACERS Left 05/20/2022   Procedure: EXCISIONAL LEFT TOTAL HIP ARTHROPLASTY WITH ANTIBIOTIC SPACERS;  Surgeon: Mcarthur Rossetti, MD;  Location: [facility];  Service: Orthopedics;  Laterality: Left;   FOOT SURGERY Right    ligaments repaired- many years ago per pt   HERNIA REPAIR     JOINT REPLACEMENT Bilateral    hip   POLYPECTOMY  07/16/2021   Procedure: POLYPECTOMY;  Surgeon: Sharyn Creamer, MD;  Location: [facility] ENDOSCOPY;  Service: Gastroenterology;;   SHOULDER ARTHROSCOPY WITH ROTATOR CUFF REPAIR AND SUBACROMIAL DECOMPRESSION Left 02/10/2022   Procedure: LEFT  SHOULDER ARTHROSCOPY WITH EXTENSIVE DEBRIDEMENT, SUBACROMIAL DECOMPRESSION;  Surgeon: Mcarthur Rossetti, MD;  Location: [place];  Service: Orthopedics;  Laterality: Left;   TEE WITHOUT CARDIOVERSION N/A 07/07/2021   Procedure: TRANSESOPHAGEAL ECHOCARDIOGRAM (TEE);  Surgeon: Kate Sable, MD;  Location: [facility] ORS;  Service: Cardiovascular;  Laterality: N/A;   Family Status  Relation Name Status   Mother  Deceased   Father  Deceased   Sister  Alive   Sister  Alive   Brother  Deceased   MGM  Deceased   MGF  Deceased   PGM  Deceased   PGF  Deceased   Family History  Problem Relation Age of Onset   Other Mother        unknown medical history   Lung cancer Father    Psoriasis Sister    Breast cancer Sister    Hypertension Brother    Hyperlipidemia Brother    Diabetes Brother    Heart attack Brother 64   Social History   Socioeconomic History   Marital status: Widowed    Spouse name: Not on file   Number of children: 1   Years of education: Not on file   Highest education level: Not on file  Occupational History   Not on file  Tobacco Use   Smoking status: Former    Packs/day: 0.25    Years: 15.00    Additional pack years: 0.00    Total pack years: 3.75    Types: Cigarettes    Quit date: 2007    Years since quitting: 17.2   Smokeless tobacco: Never  Vaping Use   Vaping Use: Never used  Substance and Sexual Activity   Alcohol use: Not Currently    Alcohol/week: 20.0 standard drinks of alcohol    Types: 20 Cans of beer per week    Comment: couple of glasses of wine on the weekends   Drug use: Not Currently   Sexual activity: Not on file  Other Topics Concern   Not on file  Social History Narrative   Not on file   Social Determinants of Health   Financial Resource Strain: Not on file  Food Insecurity: No Food Insecurity (10/06/2022)   Hunger Vital Sign    Worried About Running Out of Food in the Last Year: Never true    Ran Out of Food in the Last  Year: Never true  Transportation Needs: No Transportation Needs (10/06/2022)   PRAPARE - Hydrologist (Medical): No    Lack of Transportation (Non-Medical): No  Physical Activity: Not on file  Stress: Not on file  Social Connections: Not on file   Outpatient Medications Prior to Visit  Medication Sig   acetaminophen (TYLENOL) 500 MG tablet Take 500-1,000 mg by mouth in the morning, at noon, and at bedtime. Take 1000 mg by mouth in the morning. Take 500 mg by mouth in the afternoon and at bedtime.   albuterol (VENTOLIN HFA) 108 (90 Base) MCG/ACT inhaler Inhale 1-2 puffs into the lungs every 6 (six) hours  as needed for wheezing or shortness of breath. (Patient taking differently: Inhale 2 puffs into the lungs as needed for wheezing or shortness of breath.)   amoxicillin (AMOXIL) 500 MG capsule Take 500 mg by mouth every 8 (eight) hours.   furosemide (LASIX) 20 MG tablet Take 1 tablet (20 mg total) by mouth once daily as needed (swelling or weight gain). (Patient taking differently: Take 20 mg by mouth daily as needed for fluid or edema (swelling or wt gain).)   glucose blood (RIGHTEST GS550 BLOOD GLUCOSE) test strip USE AS DIRECTED TO CHECK BLOOD SUGAR UP TO 4 TIMES DAILY.   ketotifen (ZADITOR) 0.025 % ophthalmic solution Place 1 drop into both eyes daily as needed (allergies).   loratadine (CLARITIN) 10 MG tablet Take 10 mg by mouth daily as needed for allergies.   oxyCODONE (OXY IR/ROXICODONE) 5 MG immediate release tablet Take 1-2 tablets (5-10 mg total) by mouth every 6 (six) hours as needed for moderate pain (pain score 4-6). (Patient not taking: Reported on 12/01/2022)   [DISCONTINUED] aspirin 81 MG chewable tablet Chew 1 tablet (81 mg total) by mouth 2 (two) times daily.   [DISCONTINUED] gabapentin (NEURONTIN) 300 MG capsule Take 1 capsule (300 mg total) by mouth 2 (two) times daily.   [DISCONTINUED] metFORMIN (GLUCOPHAGE) 1000 MG tablet Take 0.5 tablets (500 mg  total) by mouth 2 (two) times daily with a meal.   [DISCONTINUED] methocarbamol (ROBAXIN) 500 MG tablet Take 1 tablet (500 mg total) by mouth every 6 (six) hours as needed for muscle spasms. (Patient not taking: Reported on 12/01/2022)   [DISCONTINUED] Rightest GL300 Lancets MISC USE AS DIRECTED TO CHECK BLOOD SUGAR UP TO 4 TIMES DAILY.   No facility-administered medications prior to visit.   No Known Allergies  Immunization History  Administered Date(s) Administered   Influenza,inj,Quad PF,6+ Mos 06/25/2021   Influenza-Unspecified 06/13/2022    Health Maintenance  Topic Date Due   COVID-19 Vaccine (1) Never done   FOOT EXAM  Never done   OPHTHALMOLOGY EXAM  Never done   DTaP/Tdap/Td (1 - Tdap) Never done   Zoster Vaccines- Shingrix (1 of 2) Never done   Diabetic kidney evaluation - Urine ACR  08/25/2022   HEMOGLOBIN A1C  03/28/2023   Diabetic kidney evaluation - eGFR measurement  10/08/2023   COLONOSCOPY (Pts 45-12yrs Insurance coverage will need to be confirmed)  07/17/2031   INFLUENZA VACCINE  Completed   Hepatitis C Screening  Completed   HIV Screening  Completed   HPV VACCINES  Aged Out    Patient Care Team: Pcp, No as PCP - General Wellington Hampshire, MD as PCP - Cardiology (Cardiology) Vickie Epley, MD as PCP - Electrophysiology (Cardiology)  Review of Systems  All other systems reviewed and are negative. See HPI     Objective    BP 127/69 (BP Location: Right Arm, Patient Position: Sitting, Cuff Size: Large)   Pulse 87   Temp 98.4 F (36.9 C)   Ht 5\' 10"  (1.778 m)   Wt 232 lb (105.2 kg)   SpO2 100%   BMI 33.29 kg/m    Physical Exam Vitals reviewed.  Constitutional:      General: He is not in acute distress.    Appearance: Normal appearance. He is obese. He is not diaphoretic.  HENT:     Head: Normocephalic and atraumatic.  Eyes:     General: No scleral icterus.       Right eye: No discharge.  Left eye: No discharge.     Extraocular  Movements: Extraocular movements intact.     Conjunctiva/sclera: Conjunctivae normal.     Pupils: Pupils are equal, round, and reactive to light.  Cardiovascular:     Rate and Rhythm: Normal rate and regular rhythm.     Pulses: Normal pulses.     Heart sounds: Normal heart sounds. No murmur heard. Pulmonary:     Effort: Pulmonary effort is normal. No respiratory distress.     Breath sounds: Normal breath sounds. No wheezing or rhonchi.  Abdominal:     General: Abdomen is flat. Bowel sounds are normal.     Palpations: Abdomen is soft.  Musculoskeletal:        General: Normal range of motion.     Cervical back: Normal range of motion and neck supple.     Right lower leg: No edema.     Left lower leg: No edema.  Lymphadenopathy:     Cervical: No cervical adenopathy.  Skin:    General: Skin is warm and dry.     Capillary Refill: Capillary refill takes less than 2 seconds.     Findings: No rash.  Neurological:     Mental Status: He is alert and oriented to person, place, and time. Mental status is at baseline.  Psychiatric:        Behavior: Behavior normal.        Thought Content: Thought content normal.        Judgment: Judgment normal.     Depression Screen    12/01/2022    2:09 PM 03/31/2022    9:40 AM 08/25/2021    9:34 AM  PHQ 2/9 Scores  PHQ - 2 Score 0 0 0  PHQ- 9 Score 4     No results found for any visits on 12/01/22.  Assessment & Plan       Hx of bacterial endocarditis History of heart block History of MSSA bacteremia Has been seen yearly Would like to establish care with a new cardiologist - Ambulatory referral to cardiology, last was seen by cardiology on 05/12/2022 Had a previously a low risk nuclear stress test in March 2023   Peripheral polyneuropathy Chronic condition Most likely due to diabetes Continue current regimen: - gabapentin (NEURONTIN) 300 MG capsule; Take 1 capsule (300 mg total) by mouth 2 (two) times daily.  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  Dispense: 180 tablet; Refill: 0 - Ambulatory referral to Ophthalmology Needs to take vitamin B-12 over-the-counter while on metformin Advised to follow low-carb diet and regular exercise as tolerated Patient requested to fill out a form for [facility] program [facility]  Obesity (BMI 30.0-34.9) Chronic and BMI 33.29 today Pt needs to proceed with strict low-carb, low-fat diet and daily exercise. Weight loss of 5% of pt's current weight via healthy diet and daily exercise encouraged.  Patient has a hip surgery recently/in January which decreased his daily activity, see note from 11/16/2022 from orthopedic surgeon Will reassess  Anemia/thrombocytopenia Chronic problem Unclear etiology? Based on CBC results from 10/08/2022, was diagnosed in 2022 Will repeat at his next follow-up Denies having bleeding  problems Will monitor  Establish care Welcomed to our clinic Reviewed past medical hx, social hx, family hx and surgical hx Pt advised to sign a release form for his old records  Return in about 6 weeks (around 01/12/2023) for chronic disease f/u, labs prior to appt. the rest of his chronic of his chronic conditions will be addressed in order of priority   The patient was advised to call back or seek an in-person evaluation if the symptoms worsen or if the condition fails to improve as anticipated.  I discussed the assessment and treatment plan with the patient. The patient was provided an opportunity to ask questions and all were answered. The patient agreed with the plan and demonstrated an  understanding of the instructions.  I, Mardene Speak, PA-C have reviewed all documentation for this visit. The documentation on  12/01/22 for the exam, diagnosis, procedures, and orders are all accurate and complete.  Mardene Speak, [facility], [place] [phone] (phone) [phone] (fax)   [place]

## 2022-12-02 DIAGNOSIS — E0821 Diabetes mellitus due to underlying condition with diabetic nephropathy: Secondary | ICD-10-CM | POA: Insufficient documentation

## 2022-12-02 DIAGNOSIS — E669 Obesity, unspecified: Secondary | ICD-10-CM | POA: Insufficient documentation

## 2022-12-02 DIAGNOSIS — Z8679 Personal history of other diseases of the circulatory system: Secondary | ICD-10-CM | POA: Insufficient documentation

## 2022-12-02 MED ORDER — METFORMIN HCL 1000 MG PO TABS: 500.0000 mg | ORAL_TABLET | Freq: Two times a day (BID) | ORAL | 0 refills | Status: DC

## 2022-12-02 MED ORDER — GABAPENTIN 300 MG PO CAPS: 300.0000 mg | ORAL_CAPSULE | Freq: Two times a day (BID) | ORAL | 0 refills | Status: DC

## 2022-12-12 DIAGNOSIS — Z419 Encounter for procedure for purposes other than remedying health state, unspecified: Secondary | ICD-10-CM | POA: Diagnosis not present

## 2022-12-19 ENCOUNTER — Encounter: Payer: Self-pay | Admitting: Orthopaedic Surgery

## 2022-12-19 ENCOUNTER — Ambulatory Visit (INDEPENDENT_AMBULATORY_CARE_PROVIDER_SITE_OTHER): Payer: Medicaid Other | Admitting: Orthopaedic Surgery

## 2022-12-19 DIAGNOSIS — Z96649 Presence of unspecified artificial hip joint: Secondary | ICD-10-CM

## 2022-12-19 NOTE — Progress Notes (Signed)
The patient is now 10 weeks status post a left total hip two-stage revision secondary to a chronic infection.  He is 62 years old.  He is feeling much better overall.  This is the first time of seeing him be able to walk without assistive device.  On exam there is still some weakness in his left hip to be expected but overall he has tolerates me putting him through range of motion of that left hip and he is doing much better overall.  He denies any fever or chills.  He does have some chronic left knee pain and chronic bilateral shoulder issues but we will hold off on addressing these and he agrees as well.  Overall he does look much better.  He understands that he still needs to be diligent with his blood glucose control.  From my standpoint I will see him back in 3 months with a standing low AP pelvis and lateral of his left hip.

## 2023-01-04 ENCOUNTER — Other Ambulatory Visit: Payer: Self-pay | Admitting: Physician Assistant

## 2023-01-04 DIAGNOSIS — E669 Obesity, unspecified: Secondary | ICD-10-CM

## 2023-01-04 DIAGNOSIS — E119 Type 2 diabetes mellitus without complications: Secondary | ICD-10-CM

## 2023-01-04 DIAGNOSIS — Z794 Long term (current) use of insulin: Secondary | ICD-10-CM | POA: Diagnosis not present

## 2023-01-05 LAB — COMPREHENSIVE METABOLIC PANEL
ALT: 12 IU/L (ref 0–44)
AST: 17 IU/L (ref 0–40)
Albumin/Globulin Ratio: 1.1 — ABNORMAL LOW (ref 1.2–2.2)
Albumin: 4.3 g/dL (ref 3.8–4.9)
Alkaline Phosphatase: 165 IU/L — ABNORMAL HIGH (ref 44–121)
BUN/Creatinine Ratio: 20 (ref 10–24)
BUN: 16 mg/dL (ref 8–27)
Bilirubin Total: 0.5 mg/dL (ref 0.0–1.2)
CO2: 22 mmol/L (ref 20–29)
Calcium: 9.5 mg/dL (ref 8.6–10.2)
Chloride: 101 mmol/L (ref 96–106)
Creatinine, Ser: 0.79 mg/dL (ref 0.76–1.27)
Globulin, Total: 3.9 g/dL (ref 1.5–4.5)
Glucose: 128 mg/dL — ABNORMAL HIGH (ref 70–99)
Potassium: 4.3 mmol/L (ref 3.5–5.2)
Sodium: 138 mmol/L (ref 134–144)
Total Protein: 8.2 g/dL (ref 6.0–8.5)
eGFR: 102 mL/min/{1.73_m2} (ref >59)

## 2023-01-05 LAB — LIPID PANEL
Chol/HDL Ratio: 2.9 ratio (ref 0.0–5.0)
Cholesterol, Total: 155 mg/dL (ref 100–199)
HDL: 53 mg/dL (ref >39)
LDL Chol Calc (NIH): 85 mg/dL (ref 0–99)
Triglycerides: 93 mg/dL (ref 0–149)
VLDL Cholesterol Cal: 17 mg/dL (ref 5–40)

## 2023-01-05 LAB — HEMOGLOBIN A1C
Est. average glucose Bld gHb Est-mCnc: 143 mg/dL
Hgb A1c MFr Bld: 6.6 % — ABNORMAL HIGH (ref 4.8–5.6)

## 2023-01-05 LAB — TSH: TSH: 2.18 u[IU]/mL (ref 0.450–4.500)

## 2023-01-09 NOTE — Progress Notes (Signed)
Please, let pt know that his labs are stable for him wih some elevation of alkaline phophatase, we will need to repeat the testing during your FU A1C was 6.6, still within <7 of therapeutic goal and TSH/thyroid function is WNL Advised healthy low carb, low cholesterol, low calorie diet and daily exercise

## 2023-01-10 ENCOUNTER — Encounter: Payer: Self-pay | Admitting: Physician Assistant

## 2023-01-10 ENCOUNTER — Ambulatory Visit (INDEPENDENT_AMBULATORY_CARE_PROVIDER_SITE_OTHER): Payer: Medicaid Other | Admitting: Physician Assistant

## 2023-01-10 VITALS — BP 138/85 | HR 86 | Ht 70.0 in | Wt 243.0 lb

## 2023-01-10 DIAGNOSIS — E669 Obesity, unspecified: Secondary | ICD-10-CM

## 2023-01-10 DIAGNOSIS — G629 Polyneuropathy, unspecified: Secondary | ICD-10-CM

## 2023-01-10 DIAGNOSIS — E0821 Diabetes mellitus due to underlying condition with diabetic nephropathy: Secondary | ICD-10-CM | POA: Diagnosis not present

## 2023-01-10 DIAGNOSIS — Z8679 Personal history of other diseases of the circulatory system: Secondary | ICD-10-CM | POA: Diagnosis not present

## 2023-01-10 DIAGNOSIS — E119 Type 2 diabetes mellitus without complications: Secondary | ICD-10-CM

## 2023-01-10 NOTE — Progress Notes (Unsigned)
Established patient visit  Patient: Joshua Cole   DOB: 25-Feb-1961   62 y.o. Male  MRN: [id number] Visit Date: 01/10/2023  Today's healthcare provider: Debera Lat, PA-C   Chief Complaint  Patient presents with   Follow-up    4- 6 wk f/u (address hx of astma for [facility] agenda)   Subjective    HPI HPI     Follow-up    Additional comments: 4- 6 wk f/u (address hx of astma for [facility] agenda)      Last edited by Shelly Bombard, CMA on 01/10/2023 10:10 AM.      [place] Diabetes Mellitus Type II, Follow-up  Lab Results  Component Value Date   HGBA1C 6.6 (H) 01/04/2023   HGBA1C 5.9 (H) 09/27/2022   HGBA1C 6.5 (A) 08/25/2022   Wt Readings from Last 3 Encounters:  01/10/23 243 lb (110.2 kg)  12/01/22 232 lb (105.2 kg)  10/06/22 233 lb 0.4 oz (105.7 kg)   Last seen for diabetes 6 weeks ago.  Management since then includes metformin 500 mg twice daily. He reports good compliance with treatment. He is not having side effects.  Symptoms: No fatigue No foot ulcerations  No appetite changes No nausea  {Yes/No:20286} paresthesia of the feet  No polydipsia  No polyuria No visual disturbances   No vomiting     Home blood sugar records:  not doing at home  Episodes of hypoglycemia? No     Most Recent Eye Exam: *** {Current exercise:16438:::1}low carb  {Current diet habits:16563:::1}elliptical?  Pertinent Labs: Lab Results  Component Value Date   CHOL 155 01/04/2023   HDL 53 01/04/2023   LDLCALC 85 01/04/2023   TRIG 93 01/04/2023   CHOLHDL 2.9 01/04/2023   Lab Results  Component Value Date   NA 138 01/04/2023   K 4.3 01/04/2023   CREATININE 0.79 01/04/2023   EGFR 102 01/04/2023   LABMICR 23.1 08/25/2021   MICRALBCREAT 33 (H) 08/25/2021     --------------------------------------------------------------------------------------------------- Gained some weight as his girlfriend loves to bake.     01/10/2023   10:16 AM  12/01/2022    2:09 PM 03/31/2022    9:40 AM  Depression screen PHQ 2/9  Decreased Interest 0 0 0  Down, Depressed, Hopeless 0 0 0  PHQ - 2 Score 0 0 0  Altered sleeping 1 2   Tired, decreased energy 1 2   Change in appetite 2 0   Feeling bad or failure about yourself  0 0   Trouble concentrating 0 0   Moving slowly or fidgety/restless 0 0   Suicidal thoughts 0 0   PHQ-9 Score 4 4   Difficult doing work/chores Not difficult at all Not difficult at all        No data to display          Medications: Outpatient Medications Prior to Visit  Medication Sig   acetaminophen (TYLENOL) 500 MG tablet Take 500-1,000 mg by mouth in the morning, at noon, and at bedtime. Take 1000 mg by mouth in the morning. Take 500 mg by mouth in the afternoon and at bedtime.   albuterol (VENTOLIN HFA) 108 (90 Base) MCG/ACT inhaler Inhale 1-2 puffs into the lungs every 6 (six) hours as needed for wheezing or shortness of breath. (Patient taking differently: Inhale 2 puffs into the lungs as needed for wheezing or shortness of breath.)   furosemide (LASIX) 20 MG tablet Take 1 tablet (20 mg total) by mouth once daily  as needed (swelling or weight gain). (Patient taking differently: Take 20 mg by mouth daily as needed for fluid or edema (swelling or wt gain).)   gabapentin (NEURONTIN) 300 MG capsule Take 1 capsule (300 mg total) by mouth 2 (two) times daily.   glucose blood (RIGHTEST GS550 BLOOD GLUCOSE) test strip USE AS DIRECTED TO CHECK BLOOD SUGAR UP TO 4 TIMES DAILY.   ketotifen (ZADITOR) 0.025 % ophthalmic solution Place 1 drop into both eyes daily as needed (allergies).   loratadine (CLARITIN) 10 MG tablet Take 10 mg by mouth daily as needed for allergies.   metFORMIN (GLUCOPHAGE) 1000 MG tablet Take 0.5 tablets (500 mg total) by mouth 2 (two) times daily with a meal.   [DISCONTINUED] amoxicillin (AMOXIL) 500 MG capsule Take 500 mg by mouth every 8 (eight) hours.   [DISCONTINUED] oxyCODONE (OXY IR/ROXICODONE)  5 MG immediate release tablet Take 1-2 tablets (5-10 mg total) by mouth every 6 (six) hours as needed for moderate pain (pain score 4-6). (Patient not taking: Reported on 12/01/2022)   No facility-administered medications prior to visit.    Review of Systems Except see HPI   {Labs  Heme  Chem  Endocrine  Serology  Results Review (optional):23779}   Objective    BP 138/85 (BP Location: Right Arm, Patient Position: Sitting, Cuff Size: Large)   Pulse 86   Ht 5\' 10"  (1.778 m)   Wt 243 lb (110.2 kg)   SpO2 96%   BMI 34.87 kg/m  {Show previous vital signs (optional):23777}  Physical Exam   No results found for any visits on 01/10/23.  Assessment & Plan    1. Obesity (BMI 30.0-34.9) Chronic and BMI 34.  87 Encouraged to proceed with strict low-carb/low-fat/low calorie diet and regular exercise as tolerated [facility], a copy of the form  Was send to [employer]. Recovering after hip surgery/done in January/follow-up on 11/16/2022 with orthopedic surgeon - Microalbumin / creatinine urine ratio - CBC w/Diff/Platelet - Ambulatory referral to Ophthalmology Will follow-up 2. 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 Peripheral polyneuropathy Chronic, stable Could be connected with diabetes Continue gabapentin 300 mg twice daily Educated on importance of blood sugar control, active lifestyle, physical therapy  4. Type 2 diabetes mellitus without complication, with long-term current use of insulin (HCC) ***  Baked 6.6   Weight loss Diet and exercise  program    Hx of bacterial endocarditis History of heart block History of MSSA bacteremia  Has been seen yearly Referral to cardiology was placed on 05/12/2022 and on 12/02/2022.  Per chart review patient refused to see cardiology on 12/06/2022.  He stated that he does not not need appointment at the moment and will call back to schedule when he feels he needs to be seen.  Had a previously a low risk nuclear stress test in March 2023     No follow-ups on file.     The patient was advised to call back or seek an in-person evaluation if the symptoms worsen or if the condition fails to improve as anticipated.  I discussed the assessment and  treatment plan with the patient. The patient was provided an opportunity to ask questions and all were answered. The patient agreed with the plan and demonstrated an understanding of the instructions.  I, Debera Lat, PA-C have reviewed all documentation for this visit. The documentation on  01/10/23 for the exam, diagnosis, procedures, and orders are all accurate and complete.  Debera Lat, [facility], MMS [facility] [phone] (phone) [phone] (fax)  [facility]

## 2023-01-11 DIAGNOSIS — Z419 Encounter for procedure for purposes other than remedying health state, unspecified: Secondary | ICD-10-CM | POA: Diagnosis not present

## 2023-01-11 LAB — CBC WITH DIFFERENTIAL/PLATELET
Basophils Absolute: 0.1 10*3/uL (ref 0.0–0.2)
Basos: 2 %
EOS (ABSOLUTE): 0.7 10*3/uL — ABNORMAL HIGH (ref 0.0–0.4)
Eos: 11 %
Hematocrit: 39 % (ref 37.5–51.0)
Hemoglobin: 12.4 g/dL — ABNORMAL LOW (ref 13.0–17.7)
Immature Grans (Abs): 0 10*3/uL (ref 0.0–0.1)
Immature Granulocytes: 0 %
Lymphocytes Absolute: 1.5 10*3/uL (ref 0.7–3.1)
Lymphs: 25 %
MCH: 27.3 pg (ref 26.6–33.0)
MCHC: 31.8 g/dL (ref 31.5–35.7)
MCV: 86 fL (ref 79–97)
Monocytes Absolute: 0.6 10*3/uL (ref 0.1–0.9)
Monocytes: 10 %
Neutrophils Absolute: 3.2 10*3/uL (ref 1.4–7.0)
Neutrophils: 52 %
Platelets: 134 10*3/uL — ABNORMAL LOW (ref 150–450)
RBC: 4.54 x10E6/uL (ref 4.14–5.80)
RDW: 14.9 % (ref 11.6–15.4)
WBC: 6 10*3/uL (ref 3.4–10.8)

## 2023-01-11 LAB — MICROALBUMIN / CREATININE URINE RATIO
Creatinine, Urine: 22.8 mg/dL
Microalb/Creat Ratio: <13 mg/g creat (ref 0–29)
Microalbumin, Urine: <3 ug/mL

## 2023-01-12 NOTE — Progress Notes (Signed)
Hello Huntley Dec ,   Your labwork results all are within normal limits, including kidney function Your hemoglobin/platelets are is still low but they are better than it was 3 months ago No changes need to be made to medications, continue with your current medication regimen , healthy diet and regular exercise as we discussed Any questions please reach out to the office or message me on MyChart!  Best,  Debera Lat, PA-C

## 2023-02-01 ENCOUNTER — Other Ambulatory Visit: Payer: Self-pay

## 2023-02-11 DIAGNOSIS — Z419 Encounter for procedure for purposes other than remedying health state, unspecified: Secondary | ICD-10-CM | POA: Diagnosis not present

## 2023-02-14 ENCOUNTER — Other Ambulatory Visit: Payer: Self-pay | Admitting: Physician Assistant

## 2023-02-14 DIAGNOSIS — E0821 Diabetes mellitus due to underlying condition with diabetic nephropathy: Secondary | ICD-10-CM

## 2023-02-14 NOTE — Telephone Encounter (Signed)
Metformin Rx- 12/02/22 #180- too soon Requested Prescriptions  Pending Prescriptions Disp Refills   metFORMIN (GLUCOPHAGE) 1000 MG tablet 180 tablet 0    Sig: Take 0.5 tablets (500 mg total) by mouth 2 (two) times daily with a meal.     Endocrinology:  Diabetes - Biguanides Failed - 02/14/2023 10:10 AM      Failed - B12 Level in normal range and within 720 days    No results found for: "VITAMINB12"       Passed - Cr in normal range and within 360 days    Creatinine, Ser  Date Value Ref Range Status  01/04/2023 0.79 0.76 - 1.27 mg/dL Final         Passed - HBA1C is between 0 and 7.9 and within 180 days    Hgb A1c MFr Bld  Date Value Ref Range Status  01/04/2023 6.6 (H) 4.8 - 5.6 % Final    Comment:             Prediabetes: 5.7 - 6.4          Diabetes: >6.4          Glycemic control for adults with diabetes: <7.0          Passed - eGFR in normal range and within 360 days    GFR, Estimated  Date Value Ref Range Status  10/07/2022 >60 >60 mL/min Final    Comment:    (NOTE) Calculated using the CKD-EPI Creatinine Equation (2021)    eGFR  Date Value Ref Range Status  01/04/2023 102 >59 mL/min/1.73 Final         Passed - Valid encounter within last 6 months    Recent Outpatient Visits           1 month ago Obesity (BMI 30.0-34.9)   [facility] [place], [place], PA-C   2 months ago Hx of bacterial endocarditis   [place] [facility] [place], [place], PA-C       Future Appointments             In 2 months Ostwalt, [place], PA-C [facility]            Passed - CBC within normal limits and completed in the last 12 months    WBC  Date Value Ref Range Status  01/10/2023 6.0 3.4 - 10.8 x10E3/uL Final  10/08/2022 14.1 (H) 4.0 - 10.5 K/uL Final   RBC  Date Value Ref Range Status  01/10/2023 4.54 4.14 - 5.80 x10E6/uL Final  10/08/2022 2.83 (L) 4.22 - 5.81 MIL/uL Final   Hemoglobin  Date Value  Ref Range Status  01/10/2023 12.4 (L) 13.0 - 17.7 g/dL Final   Hematocrit  Date Value Ref Range Status  01/10/2023 39.0 37.5 - 51.0 % Final   MCHC  Date Value Ref Range Status  01/10/2023 31.8 31.5 - 35.7 g/dL Final  40/98/1191 47.8 30.0 - 36.0 g/dL Final   [facility]  Date Value Ref Range Status  01/10/2023 27.3 26.6 - 33.0 pg Final  10/08/2022 32.2 26.0 - 34.0 pg Final   MCV  Date Value Ref Range Status  01/10/2023 86 79 - 97 fL Final   No results found for: "PLTCOUNTKUC", "LABPLAT", "POCPLA" RDW  Date Value Ref Range Status  01/10/2023 14.9 11.6 - 15.4 % Final          furosemide (LASIX) 20 MG tablet 30 tablet 2    Sig: Take 1 tablet (20 mg  total) by mouth once daily as needed (swelling or weight gain).     Cardiovascular:  Diuretics - Loop Failed - 02/14/2023 10:10 AM      Failed - Mg Level in normal range and within 180 days    Magnesium  Date Value Ref Range Status  07/20/2021 2.2 1.7 - 2.4 mg/dL Final    Comment:    Performed at [facility], [address], [place], [place] [address]         Passed - K in normal range and within 180 days    Potassium  Date Value Ref Range Status  01/04/2023 4.3 3.5 - 5.2 mmol/L Final         Passed - Ca in normal range and within 180 days    Calcium  Date Value Ref Range Status  01/04/2023 9.5 8.6 - 10.2 mg/dL Final         Passed - Na in normal range and within 180 days    Sodium  Date Value Ref Range Status  01/04/2023 138 134 - 144 mmol/L Final         Passed - Cr in normal range and within 180 days    Creatinine, Ser  Date Value Ref Range Status  01/04/2023 0.79 0.76 - 1.27 mg/dL Final         Passed - Cl in normal range and within 180 days    Chloride  Date Value Ref Range Status  01/04/2023 101 96 - 106 mmol/L Final         Passed - Last BP in normal range    BP Readings from Last 1 Encounters:  01/10/23 138/85         Passed - Valid encounter within last 6 months    Recent Outpatient Visits            1 month ago Obesity (BMI 30.0-34.9)   [place] [facility] [place], [place], [place]   2 months ago Hx of bacterial endocarditis   [facility] [place], [place], PA-C       Future Appointments             In 2 months Ostwalt, Edmon Crape, PA-C [facility], [place]

## 2023-02-14 NOTE — Telephone Encounter (Signed)
Requested medication (s) are due for refill today- expired Rx  Requested medication (s) are on the active medication list -yes  Future visit scheduled -yes  Last refill: 07/22/21 #30 2RF  Notes to clinic: expired Rx, outside provider  Requested Prescriptions  Pending Prescriptions Disp Refills   furosemide (LASIX) 20 MG tablet 30 tablet 2    Sig: Take 1 tablet (20 mg total) by mouth once daily as needed (swelling or weight gain).     Cardiovascular:  Diuretics - Loop Failed - 02/14/2023 10:10 AM      Failed - Mg Level in normal range and within 180 days    Magnesium  Date Value Ref Range Status  07/20/2021 2.2 1.7 - 2.4 mg/dL Final    Comment:    Performed at [facility], [address] [place], [place] [address]         Passed - K in normal range and within 180 days    Potassium  Date Value Ref Range Status  01/04/2023 4.3 3.5 - 5.2 mmol/L Final         Passed - Ca in normal range and within 180 days    Calcium  Date Value Ref Range Status  01/04/2023 9.5 8.6 - 10.2 mg/dL Final         Passed - Na in normal range and within 180 days    Sodium  Date Value Ref Range Status  01/04/2023 138 134 - 144 mmol/L Final         Passed - Cr in normal range and within 180 days    Creatinine, Ser  Date Value Ref Range Status  01/04/2023 0.79 0.76 - 1.27 mg/dL Final         Passed - Cl in normal range and within 180 days    Chloride  Date Value Ref Range Status  01/04/2023 101 96 - 106 mmol/L Final         Passed - Last BP in normal range    BP Readings from Last 1 Encounters:  01/10/23 138/85         Passed - Valid encounter within last 6 months    Recent Outpatient Visits           1 month ago Obesity (BMI 30.0-34.9)   [place] [facility] [place], [place], PA-C   2 months ago Hx of bacterial endocarditis   [place] [facility] [place], [place], PA-C       Future Appointments             In 2 months Ostwalt, [place],  PA-C [facility]            Refused Prescriptions Disp Refills   metFORMIN (GLUCOPHAGE) 1000 MG tablet 180 tablet 0    Sig: Take 0.5 tablets (500 mg total) by mouth 2 (two) times daily with a meal.     Endocrinology:  Diabetes - Biguanides Failed - 02/14/2023 10:10 AM      Failed - B12 Level in normal range and within 720 days    No results found for: "VITAMINB12"       Passed - Cr in normal range and within 360 days    Creatinine, Ser  Date Value Ref Range Status  01/04/2023 0.79 0.76 - 1.27 mg/dL Final         Passed - HBA1C is between 0 and 7.9 and within 180 days    Hgb A1c MFr Bld  Date  Value Ref Range Status  01/04/2023 6.6 (H) 4.8 - 5.6 % Final    Comment:             Prediabetes: 5.7 - 6.4          Diabetes: >6.4          Glycemic control for adults with diabetes: <7.0          Passed - eGFR in normal range and within 360 days    GFR, Estimated  Date Value Ref Range Status  10/07/2022 >60 >60 mL/min Final    Comment:    (NOTE) Calculated using the CKD-EPI Creatinine Equation (2021)    eGFR  Date Value Ref Range Status  01/04/2023 102 >59 mL/min/1.73 Final         Passed - Valid encounter within last 6 months    Recent Outpatient Visits           1 month ago Obesity (BMI 30.0-34.9)   [place] [facility] [place], [place]ilda, PA-C   2 months ago Hx of bacterial endocarditis   [place] [facility] [place], [place], PA-C       Future Appointments             In 2 months Ostwalt, [place], PA-C [place] [facility]            Passed - CBC within normal limits and completed in the last 12 months    WBC  Date Value Ref Range Status  01/10/2023 6.0 3.4 - 10.8 x10E3/uL Final  10/08/2022 14.1 (H) 4.0 - 10.5 K/uL Final   RBC  Date Value Ref Range Status  01/10/2023 4.54 4.14 - 5.80 x10E6/uL Final  10/08/2022 2.83 (L) 4.22 - 5.81 MIL/uL Final   Hemoglobin  Date  Value Ref Range Status  01/10/2023 12.4 (L) 13.0 - 17.7 g/dL Final   Hematocrit  Date Value Ref Range Status  01/10/2023 39.0 37.5 - 51.0 % Final   MCHC  Date Value Ref Range Status  01/10/2023 31.8 31.5 - 35.7 g/dL Final  16/06/9603 54.0 30.0 - 36.0 g/dL Final   [facility]  Date Value Ref Range Status  01/10/2023 27.3 26.6 - 33.0 pg Final  10/08/2022 32.2 26.0 - 34.0 pg Final   MCV  Date Value Ref Range Status  01/10/2023 86 79 - 97 fL Final   No results found for: "PLTCOUNTKUC", "LABPLAT", "POCPLA" RDW  Date Value Ref Range Status  01/10/2023 14.9 11.6 - 15.4 % Final            Requested Prescriptions  Pending Prescriptions Disp Refills   furosemide (LASIX) 20 MG tablet 30 tablet 2    Sig: Take 1 tablet (20 mg total) by mouth once daily as needed (swelling or weight gain).     Cardiovascular:  Diuretics - Loop Failed - 02/14/2023 10:10 AM      Failed - Mg Level in normal range and within 180 days    Magnesium  Date Value Ref Range Status  07/20/2021 2.2 1.7 - 2.4 mg/dL Final    Comment:    Performed at [facility], [address] [place], [place] [address]         Passed - K in normal range and within 180 days    Potassium  Date Value Ref Range Status  01/04/2023 4.3 3.5 - 5.2 mmol/L Final         Passed - Ca in normal range and within 180 days  Calcium  Date Value Ref Range Status  01/04/2023 9.5 8.6 - 10.2 mg/dL Final         Passed - Na in normal range and within 180 days    Sodium  Date Value Ref Range Status  01/04/2023 138 134 - 144 mmol/L Final         Passed - Cr in normal range and within 180 days    Creatinine, Ser  Date Value Ref Range Status  01/04/2023 0.79 0.76 - 1.27 mg/dL Final         Passed - Cl in normal range and within 180 days    Chloride  Date Value Ref Range Status  01/04/2023 101 96 - 106 mmol/L Final         Passed - Last BP in normal range    BP Readings from Last 1 Encounters:  01/10/23 138/85          Passed - Valid encounter within last 6 months    Recent Outpatient Visits           1 month ago Obesity (BMI 30.0-34.9)   [place] [facility] [place], [place], PA-C   2 months ago Hx of bacterial endocarditis   [place] [facility] [place], [place], PA-C       Future Appointments             In 2 months Ostwalt, [place], PA-C [place] [facility]            Refused Prescriptions Disp Refills   metFORMIN (GLUCOPHAGE) 1000 MG tablet 180 tablet 0    Sig: Take 0.5 tablets (500 mg total) by mouth 2 (two) times daily with a meal.     Endocrinology:  Diabetes - Biguanides Failed - 02/14/2023 10:10 AM      Failed - B12 Level in normal range and within 720 days    No results found for: "VITAMINB12"       Passed - Cr in normal range and within 360 days    Creatinine, Ser  Date Value Ref Range Status  01/04/2023 0.79 0.76 - 1.27 mg/dL Final         Passed - HBA1C is between 0 and 7.9 and within 180 days    Hgb A1c MFr Bld  Date Value Ref Range Status  01/04/2023 6.6 (H) 4.8 - 5.6 % Final    Comment:             Prediabetes: 5.7 - 6.4          Diabetes: >6.4          Glycemic control for adults with diabetes: <7.0          Passed - eGFR in normal range and within 360 days    GFR, Estimated  Date Value Ref Range Status  10/07/2022 >60 >60 mL/min Final    Comment:    (NOTE) Calculated using the CKD-EPI Creatinine Equation (2021)    eGFR  Date Value Ref Range Status  01/04/2023 102 >59 mL/min/1.73 Final         Passed - Valid encounter within last 6 months    Recent Outpatient Visits           1 month ago Obesity (BMI 30.0-34.9)   [facility] [place], [place], [place]   2 months ago Hx of bacterial endocarditis   [facility] [place], [place], [place]       Future Appointments  In 2 months Ostwalt, [place]anna, PA-C [place] [facility]             Passed - CBC within normal limits and completed in the last 12 months    WBC  Date Value Ref Range Status  01/10/2023 6.0 3.4 - 10.8 x10E3/uL Final  10/08/2022 14.1 (H) 4.0 - 10.5 K/uL Final   RBC  Date Value Ref Range Status  01/10/2023 4.54 4.14 - 5.80 x10E6/uL Final  10/08/2022 2.83 (L) 4.22 - 5.81 MIL/uL Final   Hemoglobin  Date Value Ref Range Status  01/10/2023 12.4 (L) 13.0 - 17.7 g/dL Final   Hematocrit  Date Value Ref Range Status  01/10/2023 39.0 37.5 - 51.0 % Final   MCHC  Date Value Ref Range Status  01/10/2023 31.8 31.5 - 35.7 g/dL Final  16/06/9603 54.0 30.0 - 36.0 g/dL Final   [facility]  Date Value Ref Range Status  01/10/2023 27.3 26.6 - 33.0 pg Final  10/08/2022 32.2 26.0 - 34.0 pg Final   MCV  Date Value Ref Range Status  01/10/2023 86 79 - 97 fL Final   No results found for: "PLTCOUNTKUC", "LABPLAT", "POCPLA" RDW  Date Value Ref Range Status  01/10/2023 14.9 11.6 - 15.4 % Final

## 2023-02-14 NOTE — Telephone Encounter (Signed)
Medication Refill - Medication: metFORMIN (GLUCOPHAGE) 1000 MG tablet , furosemide (LASIX) 20 MG tablet  New patient of Ostwalt, Edmon Crape, needs refills.   Has the patient contacted their pharmacy? No.  (Agent: If no, request that the patient contact the pharmacy for the refill. If patient does not wish to contact the pharmacy document the reason why and proceed with request.)   Preferred Pharmacy (with phone number or street name):  [facility][id number] [place], [place] - [address] AT [facility] [address]  [address] [place] [address]  Phone: [phone] Fax: [phone]  Hours: Not open 24 hours   Has the patient been seen for an appointment in the last year OR does the patient have an upcoming appointment? Yes.    Agent: Please be advised that RX refills may take up to 3 business days. We ask that you follow-up with your pharmacy.

## 2023-02-21 ENCOUNTER — Telehealth: Payer: Self-pay | Admitting: Physician Assistant

## 2023-02-21 DIAGNOSIS — R600 Localized edema: Secondary | ICD-10-CM

## 2023-02-21 DIAGNOSIS — E0821 Diabetes mellitus due to underlying condition with diabetic nephropathy: Secondary | ICD-10-CM

## 2023-02-21 NOTE — Telephone Encounter (Signed)
Patient called and he did not disclose to me his DOB to verify I'm in the correct chart. He says he doesn't know who I am so he will not provide the DOB. I asked did he call the office wanting a refill on metformin and furosemide, he says yes because I'm out. I asked and advised I will need his DOB to verify that I'm speaking to the correct person to give information regarding the refill. He still refused, I advised someone from the office will call him tomorrow. What is needed to discuss is the furosemide was last refilled by cardiology.

## 2023-02-21 NOTE — Telephone Encounter (Signed)
Medication Refill - Medication: Furosemide 20 MG /metFORMIN HCl  Was denied sys refill too soon, pt is all out and says metformin was increased to 1000 mg, and he goes back to his cardiologist in 6 months, so he says he isnt going sooner than this  Has the patient contacted their pharmacy? yes (Agent: If no, request that the patient contact the pharmacy for the refill. If patient does not wish to contact the pharmacy document the reason why and proceed with request.) (Agent: If yes, when and what did the pharmacy advise?)contact pcp  Preferred Pharmacy (with phone number or street name):  [facility] #[id number] [place]oles [place], [place] - [address] AT [facility] [address] Phone: [phone]  Fax: [phone]     Has the patient been seen for an appointment in the last year OR does the patient have an upcoming appointment? yes  Agent: Please be advised that RX refills may take up to 3 business days. We ask that you follow-up with your pharmacy.

## 2023-02-22 ENCOUNTER — Other Ambulatory Visit: Payer: Self-pay

## 2023-02-22 MED ORDER — METFORMIN HCL 500 MG PO TABS
ORAL_TABLET | ORAL | 1 refills | Status: DC
Start: 2023-02-22 — End: 2023-02-22
  Filled 2023-02-22: qty 270, 90d supply, fill #0

## 2023-02-22 MED ORDER — METFORMIN HCL 500 MG PO TABS
ORAL_TABLET | ORAL | 1 refills | Status: DC
Start: 2023-02-22 — End: 2023-04-19
  Filled 2023-02-22: qty 270, 90d supply, fill #0

## 2023-02-22 NOTE — Telephone Encounter (Signed)
Patient states that he no longer sees his cardiologist. Please advise.

## 2023-02-22 NOTE — Telephone Encounter (Signed)
LVMTCB. CRM created.  Rx has been sent for metformin. Patient is to take 2 500 mg tablets every morning and 1 500 mg every night Furosemide would need to be requested at his cardio office as that is the provider who fill the rx

## 2023-02-23 ENCOUNTER — Other Ambulatory Visit: Payer: Self-pay

## 2023-02-23 ENCOUNTER — Telehealth: Payer: Self-pay | Admitting: Physician Assistant

## 2023-02-23 DIAGNOSIS — R600 Localized edema: Secondary | ICD-10-CM

## 2023-02-23 MED ORDER — FUROSEMIDE 20 MG PO TABS
20.0000 mg | ORAL_TABLET | Freq: Every day | ORAL | 0 refills | Status: DC | PRN
Start: 2023-02-23 — End: 2023-02-24
  Filled 2023-02-23: qty 30, 30d supply, fill #0

## 2023-02-23 NOTE — Telephone Encounter (Signed)
LVMTCB. CRM created. Ok for P[facility] to advise 

## 2023-02-23 NOTE — Telephone Encounter (Signed)
RE T/E from 6/11 re furosemide (LASIX) 20 MG tablet. This med was given as a courtsey refill but pt said was not to go [facility] as they do not take his insurance. Pt states to resend to  [facility] #[id number] [place], [place] - [address] AT [facility] OF [address] Phone: [phone]  Fax: [phone]

## 2023-02-23 NOTE — Telephone Encounter (Signed)
Please, let pt know that a Rx of lasix for a mo will be provided but he needs to establish care with cardiology considering his complicated cardiac history.

## 2023-02-23 NOTE — Telephone Encounter (Signed)
Metformin is refilled Patient would like to know if you can refill furosemide as he no longer sees his cardiologist

## 2023-02-24 ENCOUNTER — Other Ambulatory Visit: Payer: Self-pay

## 2023-02-24 MED ORDER — FUROSEMIDE 20 MG PO TABS
20.0000 mg | ORAL_TABLET | Freq: Every day | ORAL | 0 refills | Status: DC | PRN
Start: 2023-02-24 — End: 2023-03-22

## 2023-02-27 ENCOUNTER — Other Ambulatory Visit: Payer: Self-pay | Admitting: Physician Assistant

## 2023-02-27 DIAGNOSIS — G629 Polyneuropathy, unspecified: Secondary | ICD-10-CM

## 2023-02-27 NOTE — Telephone Encounter (Signed)
Requested Prescriptions  Pending Prescriptions Disp Refills   gabapentin (NEURONTIN) 300 MG capsule [Pharmacy Med Name: GABAPENTIN 300MG  CAPSULES] 180 capsule 0    Sig: TAKE 1 CAPSULE(300 MG) BY MOUTH TWICE DAILY     Neurology: Anticonvulsants - gabapentin Passed - 02/27/2023  3:39 AM      Passed - Cr in normal range and within 360 days    Creatinine, Ser  Date Value Ref Range Status  01/04/2023 0.79 0.76 - 1.27 mg/dL Final         Passed - Completed PHQ-2 or PHQ-9 in the last 360 days      Passed - Valid encounter within last 12 months    Recent Outpatient Visits           1 month ago Obesity (BMI 30.0-34.9)   [facility] [place], [place], [place]   2 months ago Hx of bacterial endocarditis   [facility] [place], [place], PA-C       Future Appointments             In 1 month Ostwalt, [place], PA-C [facility], [place]

## 2023-03-02 ENCOUNTER — Encounter: Payer: Self-pay | Admitting: Physician Assistant

## 2023-03-03 NOTE — Progress Notes (Unsigned)
Cardiology Office Note    Date:  03/07/2023   ID:  Joshua Cole, DOB November 29, 1960, MRN [id number]  PCP:  Debera Lat, PA-C  Cardiologist:  Lorine Bears, MD  Electrophysiologist:  Lanier Prude, MD   Chief Complaint: Follow-up  History of Present Illness:   Joshua Cole is a 62 y.o. 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  He was admitted in 06/2021 with weakness and hip pain.  He was found to have MSSA bacteremia.  CTA of the chest was negative for PE.  In the setting of bacteremia, TEE showed a tricuspid valve vegetation.  He developed complete heart block with junctional escape and did not require temporary transvenous pacing.  He was transferred to [facility] and seen by CT surgery with recommendation for ongoing antibiotic therapy and outpatient follow-up.  Hospital course was complicated by anemia requiring 2 units of packed red blood cells.  He followed up with ID in the outpatient setting.  Following completion of antibiotics, repeat blood cultures were drawn and negative.  In follow-up with EP in 08/2021, he was in an accelerated junctional rhythm with recommendation for conservative therapy.  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  He was reevaluated by CT surgery in 10/2021, and based on the location of the mass, it was not felt to be a good candidate for angio VAC debridement.  There was no strong indication for open debridement and it was recommended to continue conservative management.   He was seen in the office in 11/2021 noting chronic dyspnea, and for preoperative cardiac risk stratification.  To further risk stratify, he underwent Lexiscan MPI in 11/2021 that showed no evidence of ischemia or infarction and was overall low risk.  CT attenuation corrected images showed minimal coronary calcification and moderate aortic atherosclerosis.  He underwent shoulder arthroscopy in 02/2022 without cardiac complication.  He was seen by EP in 02/2022 with EKG showing sinus rhythm and narrow QRS with a prolonged PR interval.  Continued monitoring and conservative therapy was recommended with no indication for permanent pacing.  He has since undergone excisional left total hip arthroplasty with antibiotic spacer placement in 05/2022 followed by removal of antibiotic spacer and left total hip revision arthroplasty in 09/2022.  Deep wound cultures showed no growth.  He reports an increase in lower extremity swelling since having travel to the [facility] for a wedding and was eating salty food.  In this setting, he has been taking furosemide 20 mg on a daily basis to help decrease lower extremity swelling.  He notes an increase in pedal edema throughout the day with a sock indentations, and difficulty putting his shoe back on if he takes them off during the day.  No abdominal distention or orthopnea.  No shortness of breath or early satiety.  No dizziness, presyncope, or syncope.  At baseline, he does watch his salt intake, though notes he did eat an increase amount of salt at the wedding.  His weight is up 9 pounds today when compared to his last clinic visit in our office in 02/2022.   Labs  independently reviewed: 12/2022 - Hgb 12.4, PLT 134, TSH normal, A1c 6.6, TC 155, TG 93, HDL 53, LDL 85, BUN 16, serum creatinine 0.79, potassium 4.3, albumin 4.3, AST/ALT normal  Past Medical History:  Diagnosis Date   Arthritis    Asthma    Chronic kidney disease    Diabetes mellitus without complication (HCC)     type 2   DJD (degenerative joint disease)    Dysrhythmia    junctional tachycardia and incomplete heart block   Edema of left lower extremity 07/04/2022   Gout    Heart block    Lung nodules    a. 09/2021 CT chest: Interval decrease in size and number of bilateral lung nodules, likely consistent with sequelae associated with an infectious/inflammatory process.   MSSA bacteremia 06/2021   Rotator cuff tear 08/26/2021   Subacute bacterial endocarditis (SBE)    a. 06/2021 TEE: mobile mass attached to the tricuspid valve-->Abx rx-->09/2021 TEE: EF 55-60%, no rwma, nl RV fxn, mild-mod RAE, mild MR, mobile echodense 11x46mm mass in the TV apparatus-->conservative rx per TCTS.    Past Surgical History:  Procedure Laterality Date   ACHILLES TENDON REPAIR Left    many years ago per pt   ANTERIOR HIP REVISION Left 10/06/2022   Procedure: REMOVAL OF LEFT HIP ANTIBIOTIC SPACER, LEFT TOTAL HIP REVISION ARTHROPLASTY;  Surgeon: Kathryne Hitch, MD;  Location: [facility] ORS;  Service: Orthopedics;  Laterality: Left;   BIOPSY  07/16/2021   Procedure: BIOPSY;  Surgeon: Imogene Burn, MD;  Location: [facility] ENDOSCOPY;  Service: Gastroenterology;;  EGD and COLON   COLONOSCOPY N/A 07/16/2021   Procedure: COLONOSCOPY;  Surgeon: Imogene Burn, MD;  Location: [facility] ENDOSCOPY;  Service: Gastroenterology;  Laterality: N/A;   COLONOSCOPY WITH PROPOFOL N/A 07/16/2021   Procedure: COLONOSCOPY WITH PROPOFOL;  Surgeon: Imogene Burn, MD;  Location: [facility] ENDOSCOPY;  Service: Gastroenterology;  Laterality: N/A;   ESOPHAGOGASTRODUODENOSCOPY (EGD) WITH PROPOFOL N/A 07/16/2021   Procedure: ESOPHAGOGASTRODUODENOSCOPY (EGD) WITH PROPOFOL;  Surgeon: Imogene Burn, MD;  Location: [facility] ENDOSCOPY;  Service: Gastroenterology;  Laterality: N/A;   EXCISIONAL TOTAL HIP ARTHROPLASTY WITH ANTIBIOTIC SPACERS Left 05/20/2022   Procedure: EXCISIONAL LEFT TOTAL HIP ARTHROPLASTY WITH ANTIBIOTIC SPACERS;  Surgeon: Kathryne Hitch, MD;   Location: [facility] ORS;  Service: Orthopedics;  Laterality: Left;   FOOT SURGERY Right    ligaments repaired- many years ago per pt   HERNIA REPAIR     JOINT REPLACEMENT Bilateral    hip   POLYPECTOMY  07/16/2021   Procedure: POLYPECTOMY;  Surgeon: Imogene Burn, MD;  Location: [facility] ENDOSCOPY;  Service: Gastroenterology;;   SHOULDER ARTHROSCOPY WITH ROTATOR CUFF REPAIR AND SUBACROMIAL DECOMPRESSION Left 02/10/2022   Procedure: LEFT SHOULDER ARTHROSCOPY WITH EXTENSIVE DEBRIDEMENT, SUBACROMIAL DECOMPRESSION;  Surgeon: Kathryne Hitch, MD;  Location: [facility] OR;  Service: Orthopedics;  Laterality: Left;   TEE WITHOUT CARDIOVERSION N/A 07/07/2021   Procedure: TRANSESOPHAGEAL ECHOCARDIOGRAM (TEE);  Surgeon: Debbe Odea, MD;  Location: [facility] ORS;  Service: Cardiovascular;  Laterality: N/A;    Current Medications: Current Meds  Medication Sig   acetaminophen (TYLENOL) 500 MG tablet Take 500-1,000 mg by mouth in the morning, at noon, and at bedtime. Take 1000 mg by mouth in the morning. Take 500 mg by mouth in the afternoon and at bedtime.   albuterol (VENTOLIN HFA) 108 (90 Base) MCG/ACT inhaler Inhale 1-2 puffs into the lungs every 6 (six) hours as needed for wheezing or shortness of breath. (Patient taking differently: Inhale 2 puffs into  the lungs as needed for wheezing or shortness of breath.)   furosemide (LASIX) 20 MG tablet Take 1 tablet (20 mg total) by mouth daily as needed for fluid or edema (swelling or wt gain).   gabapentin (NEURONTIN) 300 MG capsule TAKE 1 CAPSULE(300 MG) BY MOUTH TWICE DAILY   glucose blood (RIGHTEST GS550 BLOOD GLUCOSE) test strip USE AS DIRECTED TO CHECK BLOOD SUGAR UP TO 4 TIMES DAILY.   ketotifen (ZADITOR) 0.025 % ophthalmic solution Place 1 drop into both eyes daily as needed (allergies).   loratadine (CLARITIN) 10 MG tablet Take 10 mg by mouth daily as needed for allergies.   metFORMIN (GLUCOPHAGE) 500 MG tablet Take 2 tablets (1,000 mg total) by mouth daily  with breakfast AND 1 tablet (500 mg total) at bedtime.   potassium chloride (KLOR-CON) 10 MEQ tablet Take 1 tablet (10 mEq total) by mouth daily. TAKE FOR 1 WEEK    Allergies:   Patient has no known allergies.   Social History   Socioeconomic History   Marital status: Widowed    Spouse name: Not on file   Number of children: 1   Years of education: Not on file   Highest education level: Not on file  Occupational History   Not on file  Tobacco Use   Smoking status: Former    Packs/day: 0.25    Years: 15.00    Additional pack years: 0.00    Total pack years: 3.75    Types: Cigarettes    Quit date: 2007    Years since quitting: 17.4   Smokeless tobacco: Never  Vaping Use   Vaping Use: Never used  Substance and Sexual Activity   Alcohol use: Not Currently    Alcohol/week: 20.0 standard drinks of alcohol    Types: 20 Cans of beer per week    Comment: couple of glasses of wine on the weekends   Drug use: Not Currently   Sexual activity: Not on file  Other Topics Concern   Not on file  Social History Narrative   Not on file   Social Determinants of Health   Financial Resource Strain: Not on file  Food Insecurity: No Food Insecurity (10/06/2022)   Hunger Vital Sign    Worried About Running Out of Food in the Last Year: Never true    Ran Out of Food in the Last Year: Never true  Transportation Needs: No Transportation Needs (10/06/2022)   PRAPARE - Administrator, Civil Service (Medical): No    Lack of Transportation (Non-Medical): No  Physical Activity: Not on file  Stress: Not on file  Social Connections: Not on file     Family History:  The patient's family history includes Breast cancer in his sister; Diabetes in his brother; Heart attack (age of onset: 50) in his brother; Hyperlipidemia in his brother; Hypertension in his brother; Lung cancer in his father; Other in his mother; Psoriasis in his sister.  ROS:   12-point review of systems is negative  unless otherwise noted in the HPI.   EKGs/Labs/Other Studies Reviewed:    Studies reviewed were summarized above. The additional studies were reviewed today:  Lexiscan MPI 12/06/2021:   The study is normal. The study is low risk.   No ST deviation was noted.   LV perfusion is normal. There is no evidence of ischemia. There is no evidence of infarction.   Left ventricular function is normal. End diastolic cavity size is normal. End systolic cavity size is normal.  CT attenuation images show evidence of moderate aortic calcifications and minimal coronary calcifications. __________  2D echo 10/07/2021: 1. Left ventricular ejection fraction, by estimation, is 55 to 60%. The  left ventricle has normal function. The left ventricle has no regional  wall motion abnormalities. Left ventricular diastolic parameters are  indeterminate.   2. Right ventricular systolic function is normal. The right ventricular  size is not well visualized.   3. Right atrial size was mild to moderately dilated.   4. The mitral valve is grossly normal. Mild mitral valve regurgitation.   5. There is a mobile echodense (11 x 7 mm) mass in the tricuspid valve  apparatus (clip 44, 51). Given history of tricuspid mass, clinical  correlation advised.   6. The aortic valve was not well visualized. Aortic valve regurgitation  is not visualized.   7. The inferior vena cava is dilated in size with <50% respiratory  variability, suggesting right atrial pressure of 15 mmHg.  __________  TEE 07/07/2021: 1. Left ventricular ejection fraction, by estimation, is 55 to 60%. The  left ventricle has normal function.   2. Right ventricular systolic function is normal. The right ventricular  size is normal.   3. No left atrial/left atrial appendage thrombus was detected.   4. The mitral valve is normal in structure. Mild mitral valve  regurgitation.   5. There is a mobile mass attached to the tricuspid valve (clip 132)   consistent with a vegetation given the current clinical context.. The  tricuspid valve is degenerative.   6. The aortic valve is tricuspid. Aortic valve regurgitation is not  visualized.  __________  2D echo 07/04/2021: 1. Left ventricular ejection fraction, by estimation, is 55 to 60%. The  left ventricle has normal function. Left ventricular endocardial border  not optimally defined to evaluate regional wall motion. There is mild left  ventricular hypertrophy. Left  ventricular diastolic parameters are indeterminate.   2. Right ventricular systolic function is normal. The right ventricular  size is normal. Tricuspid regurgitation signal is inadequate for assessing  PA pressure.   3. Left atrial size was mildly dilated.   4. Right atrial size was mildly dilated.   5. The mitral valve is normal in structure. No evidence of mitral valve  regurgitation. No evidence of mitral stenosis.   6. The aortic valve is normal in structure. Aortic valve regurgitation is  not visualized. Mild aortic valve sclerosis is present, with no evidence  of aortic valve stenosis.   7. Aortic dilatation noted. There is borderline dilatation of the aortic  root and of the ascending aorta, measuring 38 mm.   8. The tricuspid was not well visualized. However, there is likely a  mobile vergetation noted on the short axis images. Recommend a TEE.    EKG:  EKG is ordered today.  The EKG ordered today demonstrates NSR, 85 bpm, first-degree AV block, no acute ST-T changes, when compared to prior tracing, first-degree AV block is slightly longer   Recent Labs: 01/04/2023: ALT 12; BUN 16; Creatinine, Ser 0.79; Potassium 4.3; Sodium 138; TSH 2.180 01/10/2023: Hemoglobin 12.4; Platelets 134  Recent Lipid Panel    Component Value Date/Time   CHOL 155 01/04/2023 0921   TRIG 93 01/04/2023 0921   HDL 53 01/04/2023 0921   CHOLHDL 2.9 01/04/2023 0921   CHOLHDL NOT CALCULATED 07/08/2021 0628   VLDL 20 07/08/2021 0628    LDLCALC 85 01/04/2023 0921    PHYSICAL EXAM:    VS:  BP 126/76 (  BP Location: Left Arm, Patient Position: Sitting, Cuff Size: Normal)   Pulse 89   Ht 5\' 10"  (1.778 m)   Wt 241 lb 12.8 oz (109.7 kg)   SpO2 97%   BMI 34.69 kg/m   BMI: Body mass index is 34.69 kg/m.  Physical Exam Vitals reviewed.  Constitutional:      Appearance: He is well-developed.  HENT:     Head: Normocephalic and atraumatic.  Eyes:     General:        Right eye: No discharge.        Left eye: No discharge.  Neck:     Vascular: No JVD.  Cardiovascular:     Rate and Rhythm: Normal rate and regular rhythm.     Pulses:          Posterior tibial pulses are 2+ on the right side and 2+ on the left side.     Heart sounds: Normal heart sounds, S1 normal and S2 normal. Heart sounds not distant. No midsystolic click and no opening snap. No murmur heard.    No friction rub.  Pulmonary:     Effort: Pulmonary effort is normal. No respiratory distress.     Breath sounds: Examination of the right-lower field reveals rales. Examination of the left-lower field reveals rales. Rales present. No decreased breath sounds or wheezing.  Chest:     Chest wall: No tenderness.  Abdominal:     General: There is no distension.     Palpations: Abdomen is soft.     Tenderness: There is no abdominal tenderness.  Musculoskeletal:     Cervical back: Normal range of motion.     Comments: Trivial bilateral ankle edema.  Skin:    General: Skin is warm and dry.     Nails: There is no clubbing.  Neurological:     Mental Status: He is alert and oriented to person, place, and time.  Psychiatric:        Speech: Speech normal.        Behavior: Behavior normal.        Thought Content: Thought content normal.        Judgment: Judgment normal.     Wt Readings from Last 3 Encounters:  03/07/23 241 lb 12.8 oz (109.7 kg)  01/10/23 243 lb (110.2 kg)  12/01/22 232 lb (105.2 kg)     ASSESSMENT & PLAN:   Volume overload: He notes a  several week history of lower extremity swelling.  His weight is up 9 pounds when compared to his last office visit with [place] in 02/2022.  There are faint crackles along the bilateral bases.  Increase Lasix to 40 mg daily with KCl 10 mEq daily for 1 week.  Follow-up BMP in 1 week.  Obtain echo to evaluate for new cardiomyopathy.  Recommend leg elevation.  Consider compression socks.  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  Has been evaluated by ID and cardiothoracic surgery with recommendation for conservative management of residual multiple tricuspid valve mass.  History of complete heart block: Resolved and asymptomatic.  Evaluated by EP with no indication for pacemaker implantation.  Stable first-degree AV block.  Avoid AV nodal blocking medications.  Coronary artery calcification/HLD: LDL 85 not currently on statin.  Revisit in follow-up.   Disposition: F/u with Dr. Kirke Corin or an APP in 4 to 6 weeks, and EP as directed.    Medication Adjustments/Labs and Tests Ordered: Current medicines are reviewed at length  with the patient today.  Concerns regarding medicines are outlined above. Medication changes, Labs and Tests ordered today are summarized above and listed in the Patient Instructions accessible in Encounters.   Signed, Eula Listen, PA-C 03/07/2023 12:14 PM     [facility] [place] [address] [place], [place] [address] [phone]

## 2023-03-07 ENCOUNTER — Encounter: Payer: Self-pay | Admitting: Physician Assistant

## 2023-03-07 ENCOUNTER — Ambulatory Visit: Payer: Medicaid Other | Attending: Physician Assistant | Admitting: Physician Assistant

## 2023-03-07 VITALS — BP 126/76 | HR 89 | Ht 70.0 in | Wt 241.8 lb

## 2023-03-07 DIAGNOSIS — E8770 Fluid overload, unspecified: Secondary | ICD-10-CM | POA: Diagnosis not present

## 2023-03-07 DIAGNOSIS — I078 Other rheumatic tricuspid valve diseases: Secondary | ICD-10-CM

## 2023-03-07 DIAGNOSIS — I2510 Atherosclerotic heart disease of native coronary artery without angina pectoris: Secondary | ICD-10-CM | POA: Diagnosis not present

## 2023-03-07 DIAGNOSIS — I498 Other specified cardiac arrhythmias: Secondary | ICD-10-CM | POA: Diagnosis not present

## 2023-03-07 DIAGNOSIS — I2584 Coronary atherosclerosis due to calcified coronary lesion: Secondary | ICD-10-CM | POA: Diagnosis not present

## 2023-03-07 DIAGNOSIS — E785 Hyperlipidemia, unspecified: Secondary | ICD-10-CM | POA: Diagnosis not present

## 2023-03-07 DIAGNOSIS — I442 Atrioventricular block, complete: Secondary | ICD-10-CM | POA: Diagnosis not present

## 2023-03-07 MED ORDER — POTASSIUM CHLORIDE ER 10 MEQ PO TBCR: 10.0000 meq | EXTENDED_RELEASE_TABLET | Freq: Every day | ORAL | 0 refills | Status: DC

## 2023-03-07 NOTE — Patient Instructions (Signed)
Medication Instructions:  Your physician has recommended you make the following change in your medication:   TAKE Furosemide 40 mg once daily for one week TAKE Potassium 10 mEq once daily for one week.   *If you need a refill on your cardiac medications before your next appointment, please call your pharmacy*   Lab Work: BMET in 1 week over at the [facility]. No appointment is needed just stop at the registration desk to get checked in.   If you have labs (blood work) drawn today and your tests are completely normal, you will receive your results only by: MyChart Message (if you have MyChart) OR A paper copy in the mail If you have any lab test that is abnormal or we need to change your treatment, we will call you to review the results.   Testing/Procedures: Your physician has requested that you have an echocardiogram. Echocardiography is a painless test that uses sound waves to create images of your heart. It provides your doctor with information about the size and shape of your heart and how well your heart's chambers and valves are working. This procedure takes approximately one hour. There are no restrictions for this procedure. Please do NOT wear cologne, perfume, aftershave, or lotions (deodorant is allowed). Please arrive 15 minutes prior to your appointment time.    Follow-Up: At [facility], you and your health needs are our priority.  As part of our continuing mission to provide you with exceptional heart care, we have created designated Provider Care Teams.  These Care Teams include your primary Cardiologist (physician) and Advanced Practice Providers (APPs -  Physician Assistants and Nurse Practitioners) who all work together to provide you with the care you need, when you need it.    Your next appointment:   6 week(s)  Provider:   Lorine Bears, MD or Eula Listen, PA-C

## 2023-03-09 ENCOUNTER — Other Ambulatory Visit: Payer: Self-pay

## 2023-03-13 ENCOUNTER — Other Ambulatory Visit: Payer: Self-pay | Admitting: Physician Assistant

## 2023-03-13 DIAGNOSIS — Z419 Encounter for procedure for purposes other than remedying health state, unspecified: Secondary | ICD-10-CM | POA: Diagnosis not present

## 2023-03-15 ENCOUNTER — Other Ambulatory Visit
Admission: RE | Admit: 2023-03-15 | Discharge: 2023-03-15 | Disposition: A | Discharge status: Home / Self Care | Payer: Medicaid Other | Attending: Physician Assistant | Admitting: Physician Assistant

## 2023-03-15 ENCOUNTER — Other Ambulatory Visit: Payer: Self-pay

## 2023-03-15 DIAGNOSIS — I2510 Atherosclerotic heart disease of native coronary artery without angina pectoris: Secondary | ICD-10-CM | POA: Diagnosis not present

## 2023-03-15 DIAGNOSIS — I498 Other specified cardiac arrhythmias: Secondary | ICD-10-CM | POA: Insufficient documentation

## 2023-03-15 DIAGNOSIS — E785 Hyperlipidemia, unspecified: Secondary | ICD-10-CM | POA: Insufficient documentation

## 2023-03-15 DIAGNOSIS — I078 Other rheumatic tricuspid valve diseases: Secondary | ICD-10-CM | POA: Insufficient documentation

## 2023-03-15 DIAGNOSIS — E8770 Fluid overload, unspecified: Secondary | ICD-10-CM | POA: Insufficient documentation

## 2023-03-15 DIAGNOSIS — I2584 Coronary atherosclerosis due to calcified coronary lesion: Secondary | ICD-10-CM | POA: Diagnosis not present

## 2023-03-15 DIAGNOSIS — I442 Atrioventricular block, complete: Secondary | ICD-10-CM | POA: Diagnosis not present

## 2023-03-15 LAB — BASIC METABOLIC PANEL
Anion gap: 9 (ref 5–15)
BUN: 15 mg/dL (ref 6–20)
CO2: 28 mmol/L (ref 22–32)
Calcium: 9.2 mg/dL (ref 8.9–10.3)
Chloride: 98 mmol/L (ref 98–111)
Creatinine, Ser: 0.81 mg/dL (ref 0.61–1.24)
GFR, Estimated: >60 mL/min (ref >60)
Glucose, Bld: 143 mg/dL — ABNORMAL HIGH (ref 70–99)
Potassium: 4 mmol/L (ref 3.5–5.1)
Sodium: 135 mmol/L (ref 135–145)

## 2023-03-21 ENCOUNTER — Telehealth: Payer: Self-pay | Admitting: Physician Assistant

## 2023-03-21 ENCOUNTER — Ambulatory Visit: Payer: Medicaid Other | Attending: Physician Assistant

## 2023-03-21 DIAGNOSIS — I442 Atrioventricular block, complete: Secondary | ICD-10-CM | POA: Diagnosis not present

## 2023-03-21 DIAGNOSIS — R600 Localized edema: Secondary | ICD-10-CM

## 2023-03-21 DIAGNOSIS — I078 Other rheumatic tricuspid valve diseases: Secondary | ICD-10-CM

## 2023-03-21 DIAGNOSIS — I2510 Atherosclerotic heart disease of native coronary artery without angina pectoris: Secondary | ICD-10-CM | POA: Diagnosis not present

## 2023-03-21 DIAGNOSIS — Z79899 Other long term (current) drug therapy: Secondary | ICD-10-CM

## 2023-03-21 DIAGNOSIS — I2584 Coronary atherosclerosis due to calcified coronary lesion: Secondary | ICD-10-CM

## 2023-03-21 DIAGNOSIS — I071 Rheumatic tricuspid insufficiency: Secondary | ICD-10-CM

## 2023-03-21 LAB — ECHOCARDIOGRAM COMPLETE: S' Lateral: 3.4 cm

## 2023-03-21 NOTE — Telephone Encounter (Signed)
Reviewed pulmonary echo images with primary cardiologist.  Patient remains volume up, which is contributing to his tricuspid regurgitation.  IVC is dilated, consistent with hypervolemia.  Please have the patient increase furosemide back to 40 mg daily with KCl 10 mEq daily.  Repeat BMP in 2 weeks.  Keep follow-up appointment scheduled for next month.  He will need a follow-up limited echo in 3 to 4 months to reevaluate tricuspid regurgitation.  We will contact the patient with remaining echo findings once formally read.

## 2023-03-22 LAB — ECHOCARDIOGRAM COMPLETE: Area-P 1/2: 3.65 cm2

## 2023-03-22 MED ORDER — POTASSIUM CHLORIDE ER 10 MEQ PO TBCR: 10.0000 meq | EXTENDED_RELEASE_TABLET | Freq: Every day | ORAL | 0 refills | Status: DC

## 2023-03-22 MED ORDER — FUROSEMIDE 40 MG PO TABS
40.0000 mg | ORAL_TABLET | Freq: Every day | ORAL | 0 refills | Status: DC
Start: 2023-03-22 — End: 2023-06-19

## 2023-03-22 NOTE — Telephone Encounter (Signed)
Patient is returning call.  °

## 2023-03-22 NOTE — Telephone Encounter (Signed)
Called and spoke with patient and informed him of the following from [facility], [place].  Reviewed pulmonary echo images with primary cardiologist.  Patient remains volume up, which is contributing to his tricuspid regurgitation.  IVC is dilated, consistent with hypervolemia.  Please have the patient increase furosemide back to 40 mg daily with KCl 10 mEq daily.  Repeat BMP in 2 weeks.  Keep follow-up appointment scheduled for next month.  He will need a follow-up limited echo in 3 to 4 months to reevaluate tricuspid regurgitation.  We will contact the patient with remaining echo findings once formally read.    Patient verbalizes understanding. Prescriptions will be sent to preferred pharmacy.

## 2023-03-22 NOTE — Addendum Note (Signed)
Addended by: Jani Gravel on: 03/22/2023 11:04 AM   Modules accepted: Orders

## 2023-03-22 NOTE — Telephone Encounter (Signed)
Attempted to call the patient. No answer- I left a message to please call back.  

## 2023-03-27 ENCOUNTER — Other Ambulatory Visit: Payer: Self-pay | Admitting: Physician Assistant

## 2023-03-27 DIAGNOSIS — R600 Localized edema: Secondary | ICD-10-CM

## 2023-04-11 ENCOUNTER — Other Ambulatory Visit
Admission: RE | Admit: 2023-04-11 | Discharge: 2023-04-11 | Disposition: A | Discharge status: Home / Self Care | Payer: Medicaid Other | Attending: Physician Assistant | Admitting: Physician Assistant

## 2023-04-11 DIAGNOSIS — Z79899 Other long term (current) drug therapy: Secondary | ICD-10-CM | POA: Insufficient documentation

## 2023-04-11 LAB — BASIC METABOLIC PANEL
Anion gap: 11 (ref 5–15)
BUN: 16 mg/dL (ref 6–20)
CO2: 27 mmol/L (ref 22–32)
Calcium: 9.2 mg/dL (ref 8.9–10.3)
Chloride: 99 mmol/L (ref 98–111)
Creatinine, Ser: 0.81 mg/dL (ref 0.61–1.24)
GFR, Estimated: >60 mL/min (ref >60)
Glucose, Bld: 188 mg/dL — ABNORMAL HIGH (ref 70–99)
Potassium: 4.1 mmol/L (ref 3.5–5.1)
Sodium: 137 mmol/L (ref 135–145)

## 2023-04-13 DIAGNOSIS — Z419 Encounter for procedure for purposes other than remedying health state, unspecified: Secondary | ICD-10-CM | POA: Diagnosis not present

## 2023-04-18 ENCOUNTER — Encounter: Payer: Self-pay | Admitting: Physician Assistant

## 2023-04-18 ENCOUNTER — Ambulatory Visit (INDEPENDENT_AMBULATORY_CARE_PROVIDER_SITE_OTHER): Payer: Medicaid Other | Admitting: Physician Assistant

## 2023-04-18 VITALS — BP 115/69 | HR 78 | Temp 97.7°F | Ht 70.0 in | Wt 244.2 lb

## 2023-04-18 DIAGNOSIS — G629 Polyneuropathy, unspecified: Secondary | ICD-10-CM

## 2023-04-18 DIAGNOSIS — E119 Type 2 diabetes mellitus without complications: Secondary | ICD-10-CM

## 2023-04-18 DIAGNOSIS — E7849 Other hyperlipidemia: Secondary | ICD-10-CM

## 2023-04-18 DIAGNOSIS — E669 Obesity, unspecified: Secondary | ICD-10-CM | POA: Diagnosis not present

## 2023-04-18 DIAGNOSIS — I2510 Atherosclerotic heart disease of native coronary artery without angina pectoris: Secondary | ICD-10-CM

## 2023-04-18 DIAGNOSIS — I459 Conduction disorder, unspecified: Secondary | ICD-10-CM

## 2023-04-18 DIAGNOSIS — Z794 Long term (current) use of insulin: Secondary | ICD-10-CM

## 2023-04-18 DIAGNOSIS — Z789 Other specified health status: Secondary | ICD-10-CM | POA: Diagnosis not present

## 2023-04-18 DIAGNOSIS — E8779 Other fluid overload: Secondary | ICD-10-CM

## 2023-04-18 DIAGNOSIS — Z8679 Personal history of other diseases of the circulatory system: Secondary | ICD-10-CM

## 2023-04-18 NOTE — Progress Notes (Signed)
Established patient visit  Patient: Joshua Cole   DOB: Apr 25, 1961   62 y.o. Male  MRN: [id number] Visit Date: 04/18/2023  Today's healthcare provider: Debera Lat, PA-C   Chief Complaint  Patient presents with   Medical Management of Chronic Issues    3 month follow up, needs clarification on how to take metformin, was told to take two in the morning and one in the evening and when he received the bottle it stated to take one tablet in the morning and one in the evening, Needs another referral to ophthalmologist, prior one insurance doesn't cover   Subjective    HPI HPI     Medical Management of Chronic Issues    Additional comments: 3 month follow up, needs clarification on how to take metformin, was told to take two in the morning and one in the evening and when he received the bottle it stated to take one tablet in the morning and one in the evening, Needs another referral to ophthalmologist, prior one insurance doesn't cover      Last edited by Rolly Salter, CMA on 04/18/2023 10:31 AM.      *** Discussed the use of AI scribe software for clinical note transcription with the patient, who gave verbal consent to proceed.  History of Present Illness   The patient, with a history of type 2 diabetes, presents for a three month follow-up. He reports a discrepancy between the metformin dosage prescribed by the doctor and the dosage indicated on the prescription label. The doctor had instructed him to take two 500mg  tablets in the morning and one 500mg  tablet in the evening, totaling 1500mg  daily. However, the prescription label instructed him to take one 500mg  tablet in the morning and one 500mg  tablet in the evening, totaling 1000mg  daily. The patient followed the instructions on the prescription label to avoid running out of medication early. He has not checked his blood sugar in quite some time.  The patient has also started going to the gym three days a week for the past  month. He has not noticed any changes in his weight, but he feels stronger and his legs are better than they were. He has cut back on his alcohol consumption, drinking only three times in the past two months. He has not been monitoring his diet closely, but he is considering looking at the American Diabetic Association's website for dietary guidance.           04/18/2023   10:38 AM 01/10/2023   10:16 AM 12/01/2022    2:09 PM  Depression screen PHQ 2/9  Decreased Interest 0 0 0  Down, Depressed, Hopeless  0 0  PHQ - 2 Score 0 0 0  Altered sleeping 1 1 2   Tired, decreased energy 1 1 2   Change in appetite 0 2 0  Feeling bad or failure about yourself  0 0 0  Trouble concentrating 0 0 0  Moving slowly or fidgety/restless 0 0 0  Suicidal thoughts 0 0 0  PHQ-9 Score 2 4 4   Difficult doing work/chores Not difficult at all Not difficult at all Not difficult at all       No data to display          Medications: Outpatient Medications Prior to Visit  Medication Sig   acetaminophen (TYLENOL) 500 MG tablet Take 500-1,000 mg by mouth in the morning, at noon, and at bedtime. Take 1000 mg by mouth in the morning. Take 500 mg by mouth  in the afternoon and at bedtime.   albuterol (VENTOLIN HFA) 108 (90 Base) MCG/ACT inhaler Inhale 1-2 puffs into the lungs every 6 (six) hours as needed for wheezing or shortness of breath. (Patient taking differently: Inhale 2 puffs into the lungs as needed for wheezing or shortness of breath.)   furosemide (LASIX) 40 MG tablet Take 1 tablet (40 mg total) by mouth daily.   gabapentin (NEURONTIN) 300 MG capsule TAKE 1 CAPSULE(300 MG) BY MOUTH TWICE DAILY   glucose blood (RIGHTEST GS550 BLOOD GLUCOSE) test strip USE AS DIRECTED TO CHECK BLOOD SUGAR UP TO 4 TIMES DAILY.   ketotifen (ZADITOR) 0.025 % ophthalmic solution Place 1 drop into both eyes daily as needed (allergies).   loratadine (CLARITIN) 10 MG tablet Take 10 mg by mouth daily as needed for allergies.    metFORMIN (GLUCOPHAGE) 500 MG tablet Take 2 tablets (1,000 mg total) by mouth daily with breakfast AND 1 tablet (500 mg total) at bedtime.   potassium chloride (KLOR-CON) 10 MEQ tablet Take 1 tablet (10 mEq total) by mouth daily.   No facility-administered medications prior to visit.    Review of Systems Except see HPI   {Insert previous labs (optional):23779} {See past labs  Heme  Chem  Endocrine  Serology  Results Review (optional):1}   Objective    BP 115/69 (BP Location: Left Arm, Patient Position: Sitting, Cuff Size: Large)   Pulse 78   Temp 97.7 F (36.5 C) (Oral)   Ht 5\' 10"  (1.778 m)   Wt 244 lb 3.2 oz (110.8 kg)   SpO2 99%   BMI 35.04 kg/m  {Insert last BP/Wt (optional):23777}{See vitals history (optional):1}   Physical Exam   No results found for any visits on 04/18/23.  Assessment & Plan    *** Assessment and Plan    Type 2 Diabetes Mellitus Patient has been taking a lower dose of Metformin (1000mg  daily) than prescribed (1500mg  daily) due to a discrepancy in the prescription refill instructions. No recent blood glucose monitoring. Last HbA1c was 6.6 in July. -Correct Metformin prescription to 1500mg  daily (1000mg  in the morning, 500mg  in the evening). -Order HbA1c, TSH, and lipid panel. -Encourage patient to monitor blood glucose regularly. -Advise patient to review dietary guidelines from the American Diabetic Association.  Peripheral Neuropathy Patient reports persistent numbness in the toes, worse at night. -Continue current management and monitor symptoms.  Hyperlipidemia Last lipid panel was in July. Patient has a follow-up appointment with a cardiologist next week. -Repeat lipid panel to provide updated information for the cardiologist. -If the cardiologist recommends starting a statin, patient should comply.  Alcohol Use Patient reports significantly reducing alcohol intake recently. -Encourage continued reduction in alcohol  intake. -Recommend taking a Vitamin B complex supplement due to potential nutritional deficiencies associated with alcohol use.  Ophthalmology Referral Previous referral was not accepted by patient's insurance [facility]). -Work with scheduling team to find an ophthalmologist who accepts patient's insurance. -Patient to also independently search for an ophthalmologist.  Follow-up In 3 months for full blood work.         Return for chronic disease f/u.      [facility]

## 2023-04-19 ENCOUNTER — Other Ambulatory Visit: Payer: Self-pay | Admitting: Physician Assistant

## 2023-04-19 DIAGNOSIS — E119 Type 2 diabetes mellitus without complications: Secondary | ICD-10-CM

## 2023-04-19 DIAGNOSIS — E7800 Pure hypercholesterolemia, unspecified: Secondary | ICD-10-CM

## 2023-04-19 MED ORDER — METFORMIN HCL 1000 MG PO TABS
1000.0000 mg | ORAL_TABLET | Freq: Two times a day (BID) | ORAL | 3 refills | Status: DC
Start: 2023-04-19 — End: 2024-04-15

## 2023-04-19 MED ORDER — ATORVASTATIN CALCIUM 10 MG PO TABS
10.0000 mg | ORAL_TABLET | Freq: Every day | ORAL | 3 refills | Status: DC
Start: 2023-04-19 — End: 2024-04-15

## 2023-04-23 NOTE — Progress Notes (Deleted)
Cardiology Office Note    Date:  04/23/2023   ID:  Joshua Cole, DOB [birth year], MRN [id number]  PCP:  Debera Lat, PA-C  Cardiologist:  Lorine Bears, MD  Electrophysiologist:  Lanier Prude, MD   Chief Complaint: Follow up  History of Present Illness:   Joshua Cole is a 62 y.o. 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   He was admitted in 06/2021 with weakness and hip pain.  He was found to have MSSA bacteremia.  CTA of the chest was negative for PE.  In the setting of bacteremia, TEE showed a tricuspid valve vegetation.  He developed complete heart block with junctional escape and did not require temporary transvenous pacing.  He was transferred to [facility] and seen by CT surgery with recommendation for ongoing antibiotic therapy and outpatient follow-up.  Hospital course was complicated by anemia requiring 2 units of packed red blood cells.  He followed up with ID in the outpatient setting.  Following completion of antibiotics, repeat blood cultures were drawn and negative.  In follow-up with EP in 08/2021, he was in an accelerated junctional rhythm with recommendation for conservative therapy.  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  He was reevaluated by CT surgery in 10/2021, and based on the location of the mass, it was not felt to be a good candidate for angio VAC debridement.  There was no strong indication for open debridement and it was recommended to continue conservative management.  He was seen in the  office in 11/2021 noting chronic dyspnea, and for preoperative cardiac risk stratification.  To further risk stratify, he underwent Lexiscan MPI in 11/2021 that showed no evidence of ischemia or infarction and was overall low risk.  CT attenuation corrected images showed minimal coronary calcification and moderate aortic atherosclerosis.  He underwent shoulder arthroscopy in 02/2022 without cardiac complication.  He was seen by EP in 02/2022 with EKG showing sinus rhythm and narrow QRS with a prolonged PR interval.  Continued monitoring and conservative therapy was recommended with no indication for permanent pacing.  He has since undergone excisional left total hip arthroplasty with antibiotic spacer placement in 05/2022 followed by removal of antibiotic spacer and left total hip revision arthroplasty in 09/2022.  Deep wound cultures showed no growth.  He was last seen in the office on 03/07/2023, reporting an increase in lower extremity swelling and was taking furosemide 20 mg daily.  He was without chest pain, dyspnea, orthopnea, or abdominal distention.  His weight was up 9 pounds when compared to his clinic visit in 02/2022.  It was recommended he increase furosemide to 40 mg daily.  Due to a slightly uptrending BUN/serum creatinine, furosemide was subsequently reduced back to 20 mg daily.  Echo on 03/21/2023 showed an EF of 60-65%, no regional wall motion abnormalities, low normal RV systolic function mildly enlarged RV cavity size, moderate biatrial enlargement, mild to moderate mitral regurgitation, moderate to severe tricuspid regurgitation, aortic valve sclerosis without evidence of stenosis, and an estimated right atrial pressure of 15 mmHg.  Echo results were reviewed with his  primary cardiologist with recommendation to increase furosemide back to 40 mg daily and pursue a repeat limited echo in 3 to 4 months to reevaluate tricuspid regurgitation.  ***   Labs independently reviewed: 04/2023 - TC 173, TG 115,  HDL 52, LDL 100, A1c 7.1 03/2023 - potassium 4.1, BUN 16, SCr 0.81 12/2022 - Hgb 12.4, PLT 134, TSH normal, albumin 4.3, AST/ALT normal   Past Medical History:  Diagnosis Date   Arthritis    Asthma    Chronic kidney disease    Diabetes mellitus without complication (HCC)    type 2   DJD (degenerative joint disease)    Dysrhythmia    junctional tachycardia and incomplete heart block   Edema of left lower extremity 07/04/2022   Gout    Heart block    Lung nodules    a. 09/2021 CT chest: Interval decrease in size and number of bilateral lung nodules, likely consistent with sequelae associated with an infectious/inflammatory process.   MSSA bacteremia 06/2021   Rotator cuff tear 08/26/2021   Subacute bacterial endocarditis (SBE)    a. 06/2021 TEE: mobile mass attached to the tricuspid valve-->Abx rx-->09/2021 TEE: EF 55-60%, no rwma, nl RV fxn, mild-mod RAE, mild MR, mobile echodense 11x80mm mass in the TV apparatus-->conservative rx per TCTS.    Past Surgical History:  Procedure Laterality Date   ACHILLES TENDON REPAIR Left    many years ago per pt   ANTERIOR HIP REVISION Left 10/06/2022   Procedure: REMOVAL OF LEFT HIP ANTIBIOTIC SPACER, LEFT TOTAL HIP REVISION ARTHROPLASTY;  Surgeon: Kathryne Hitch, MD;  Location: [facility] ORS;  Service: Orthopedics;  Laterality: Left;   BIOPSY  07/16/2021   Procedure: BIOPSY;  Surgeon: Imogene Burn, MD;  Location: [facility] ENDOSCOPY;  Service: Gastroenterology;;  EGD and COLON   COLONOSCOPY N/A 07/16/2021   Procedure: COLONOSCOPY;  Surgeon: Imogene Burn, MD;  Location: [facility] ENDOSCOPY;  Service: Gastroenterology;  Laterality: N/A;   COLONOSCOPY WITH PROPOFOL N/A 07/16/2021   Procedure: COLONOSCOPY WITH PROPOFOL;  Surgeon: Imogene Burn, MD;  Location: [facility] ENDOSCOPY;  Service: Gastroenterology;  Laterality: N/A;   ESOPHAGOGASTRODUODENOSCOPY (EGD) WITH PROPOFOL N/A 07/16/2021   Procedure: ESOPHAGOGASTRODUODENOSCOPY (EGD) WITH PROPOFOL;  Surgeon: Imogene Burn, MD;  Location: [facility] ENDOSCOPY;  Service: Gastroenterology;  Laterality: N/A;   EXCISIONAL TOTAL HIP ARTHROPLASTY WITH ANTIBIOTIC SPACERS Left 05/20/2022   Procedure: EXCISIONAL LEFT TOTAL HIP ARTHROPLASTY WITH ANTIBIOTIC SPACERS;  Surgeon: Kathryne Hitch, MD;  Location: [facility] ORS;  Service: Orthopedics;  Laterality: Left;   FOOT SURGERY Right    ligaments repaired- many years ago per pt   HERNIA REPAIR     JOINT REPLACEMENT Bilateral    hip   POLYPECTOMY  07/16/2021   Procedure: POLYPECTOMY;  Surgeon: Imogene Burn, MD;  Location: [facility] ENDOSCOPY;  Service: Gastroenterology;;   SHOULDER ARTHROSCOPY WITH ROTATOR CUFF REPAIR AND SUBACROMIAL DECOMPRESSION Left 02/10/2022   Procedure: LEFT SHOULDER ARTHROSCOPY WITH EXTENSIVE DEBRIDEMENT, SUBACROMIAL DECOMPRESSION;  Surgeon: Kathryne Hitch, MD;  Location: [facility] OR;  Service: Orthopedics;  Laterality: Left;   TEE WITHOUT CARDIOVERSION N/A 07/07/2021   Procedure: TRANSESOPHAGEAL ECHOCARDIOGRAM (TEE);  Surgeon: Debbe Odea, MD;  Location: [facility] ORS;  Service: Cardiovascular;  Laterality: N/A;    Current Medications: No outpatient medications have been marked as taking for the 04/24/23 encounter (Appointment) with Sondra Barges, PA-C.    Allergies:   Patient has no known allergies.   Social History   Socioeconomic History   Marital status: Widowed  Spouse name: Not on file   Number of children: 1   Years of education: Not on file   Highest education level: Not on file  Occupational History   Not on file  Tobacco Use   Smoking status: Former    Current packs/day: 0.00    Average packs/day: 0.3 packs/day for 15.0 years (3.8 ttl pk-yrs)    Types: Cigarettes    Start date: 47    Quit date: 2007    Years since quitting: 17.6   Smokeless tobacco: Never  Vaping Use   Vaping status: Never Used  Substance and Sexual Activity   Alcohol use: Not Currently    Alcohol/week: 20.0 standard drinks of alcohol    Types: 20  Cans of beer per week    Comment: couple of glasses of wine on the weekends   Drug use: Not Currently   Sexual activity: Not on file  Other Topics Concern   Not on file  Social History Narrative   Not on file   Social Determinants of Health   Financial Resource Strain: Not on file  Food Insecurity: No Food Insecurity (10/06/2022)   Hunger Vital Sign    Worried About Running Out of Food in the Last Year: Never true    Ran Out of Food in the Last Year: Never true  Transportation Needs: No Transportation Needs (10/06/2022)   PRAPARE - Administrator, Civil Service (Medical): No    Lack of Transportation (Non-Medical): No  Physical Activity: Not on file  Stress: Not on file  Social Connections: Not on file     Family History:  The patient's family history includes Breast cancer in his sister; Diabetes in his brother; Heart attack (age of onset: 54) in his brother; Hyperlipidemia in his brother; Hypertension in his brother; Lung cancer in his father; Other in his mother; Psoriasis in his sister.  ROS:   12-point review of systems is negative unless otherwise noted in the HPI.   EKGs/Labs/Other Studies Reviewed:    Studies reviewed were summarized above. The additional studies were reviewed today:  2D echo 03/21/2023: 1. Left ventricular ejection fraction, by estimation, is 60 to 65%. The  left ventricle has normal function. The left ventricle has no regional  wall motion abnormalities. Left ventricular diastolic parameters are  indeterminate. The average left  ventricular global longitudinal strain is -17.6 %.   2. Right ventricular systolic function is low normal. The right  ventricular size is mildly enlarged. Tricuspid regurgitation signal is  inadequate for assessing PA pressure.   3. Left atrial size was moderately dilated.   4. Right atrial size was moderately dilated.   5. The mitral valve is normal in structure. Mild to moderate mitral valve  regurgitation.  No evidence of mitral stenosis.   6. Tricuspid valve is not well visualized. Unable to exclude underlying  valve pathology. Regurgitation is moderate to severe. Consider TEE if  clinically indicated.   7. The aortic valve is tricuspid. Aortic valve regurgitation is not  visualized. Aortic valve sclerosis is present, with no evidence of aortic  valve stenosis.   8. The inferior vena cava is dilated in size with <50% respiratory  variability, suggesting right atrial pressure of 15 mmHg.  __________  Eugenie Birks MPI 12/06/2021:   The study is normal. The study is low risk.   No ST deviation was noted.   LV perfusion is normal. There is no evidence of ischemia. There is no evidence of infarction.  Left ventricular function is normal. End diastolic cavity size is normal. End systolic cavity size is normal.   CT attenuation images show evidence of moderate aortic calcifications and minimal coronary calcifications. __________   2D echo 10/07/2021: 1. Left ventricular ejection fraction, by estimation, is 55 to 60%. The  left ventricle has normal function. The left ventricle has no regional  wall motion abnormalities. Left ventricular diastolic parameters are  indeterminate.   2. Right ventricular systolic function is normal. The right ventricular  size is not well visualized.   3. Right atrial size was mild to moderately dilated.   4. The mitral valve is grossly normal. Mild mitral valve regurgitation.   5. There is a mobile echodense (11 x 7 mm) mass in the tricuspid valve  apparatus (clip 44, 51). Given history of tricuspid mass, clinical  correlation advised.   6. The aortic valve was not well visualized. Aortic valve regurgitation  is not visualized.   7. The inferior vena cava is dilated in size with <50% respiratory  variability, suggesting right atrial pressure of 15 mmHg.  __________   TEE 07/07/2021: 1. Left ventricular ejection fraction, by estimation, is 55 to 60%. The  left  ventricle has normal function.   2. Right ventricular systolic function is normal. The right ventricular  size is normal.   3. No left atrial/left atrial appendage thrombus was detected.   4. The mitral valve is normal in structure. Mild mitral valve  regurgitation.   5. There is a mobile mass attached to the tricuspid valve (clip 132)  consistent with a vegetation given the current clinical context.. The  tricuspid valve is degenerative.   6. The aortic valve is tricuspid. Aortic valve regurgitation is not  visualized.  __________   2D echo 07/04/2021: 1. Left ventricular ejection fraction, by estimation, is 55 to 60%. The  left ventricle has normal function. Left ventricular endocardial border  not optimally defined to evaluate regional wall motion. There is mild left  ventricular hypertrophy. Left  ventricular diastolic parameters are indeterminate.   2. Right ventricular systolic function is normal. The right ventricular  size is normal. Tricuspid regurgitation signal is inadequate for assessing  PA pressure.   3. Left atrial size was mildly dilated.   4. Right atrial size was mildly dilated.   5. The mitral valve is normal in structure. No evidence of mitral valve  regurgitation. No evidence of mitral stenosis.   6. The aortic valve is normal in structure. Aortic valve regurgitation is  not visualized. Mild aortic valve sclerosis is present, with no evidence  of aortic valve stenosis.   7. Aortic dilatation noted. There is borderline dilatation of the aortic  root and of the ascending aorta, measuring 38 mm.   8. The tricuspid was not well visualized. However, there is likely a  mobile vergetation noted on the short axis images. Recommend a TEE.    EKG:  EKG is ordered today.  The EKG ordered today demonstrates ***  Recent Labs: 01/04/2023: ALT 12; TSH 2.180 01/10/2023: Hemoglobin 12.4; Platelets 134 04/11/2023: BUN 16; Creatinine, Ser 0.81; Potassium 4.1; Sodium 137   Recent Lipid Panel    Component Value Date/Time   CHOL 173 04/18/2023 1128   TRIG 115 04/18/2023 1128   HDL 52 04/18/2023 1128   CHOLHDL 3.3 04/18/2023 1128   CHOLHDL NOT CALCULATED 07/08/2021 0628   VLDL 20 07/08/2021 0628   LDLCALC 100 (H) 04/18/2023 1128    PHYSICAL EXAM:    VS:  There were no vitals taken for this visit.  BMI: There is no height or weight on file to calculate BMI.  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  Has been evaluated by ID and cardiothoracic surgery with recommendation for conservative management of residual multiple tricuspid valve mass.  History of complete heart block:   Coronary artery calcification/HLD: LDL 100 earlier this month.  Target LDL < 70.    {Are you ordering a CV Procedure (e.g. stress test, cath, DCCV, TEE, etc)?   Press F2        :[id number]}     Disposition: F/u with Dr. Kirke Corin or an APP in ***, and EP as directed.    Medication Adjustments/Labs and Tests Ordered: Current medicines are reviewed at length with the patient today.  Concerns regarding medicines are outlined above. Medication changes, Labs and Tests ordered today are summarized above and listed in the Patient Instructions accessible in Encounters.   Signed, Eula Listen, PA-C 04/23/2023 10:48 AM     [facility] [address] [place], [place] [address] [phone]

## 2023-04-24 ENCOUNTER — Ambulatory Visit: Payer: Medicaid Other | Admitting: Physician Assistant

## 2023-04-28 ENCOUNTER — Other Ambulatory Visit: Payer: Self-pay

## 2023-05-09 NOTE — Progress Notes (Signed)
Cardiology Office Note    Date:  05/12/2023   ID:  Joshua Cole, DOB [birth year], MRN [id number]  PCP:  Debera Lat, PA-C  Cardiologist:  Lorine Bears, MD  Electrophysiologist:  Lanier Prude, MD   Chief Complaint: Follow up  History of Present Illness:   Khalib Kerns is a 62 y.o. 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   He was admitted in 06/2021 with weakness and hip pain.  He was found to have MSSA bacteremia.  CTA of the chest was negative for PE.  In the setting of bacteremia, TEE showed a tricuspid valve vegetation.  He developed complete heart block with junctional escape and did not require temporary transvenous pacing.  He was transferred to [facility] and seen by CT surgery with recommendation for ongoing antibiotic therapy and outpatient follow-up.  Hospital course was complicated by anemia requiring 2 units of packed red blood cells.  He followed up with ID in the outpatient setting.  Following completion of antibiotics, repeat blood cultures were drawn and negative.  In follow-up with EP in 08/2021, he was in an accelerated junctional rhythm with recommendation for conservative therapy.  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  He was reevaluated by CT surgery in 10/2021, and based on the location of the mass, it was not felt to be a good candidate for angio VAC debridement.  There was no strong indication for open debridement and it was recommended to continue conservative management.  He was seen in the  office in 11/2021 noting chronic dyspnea, and for preoperative cardiac risk stratification.  To further risk stratify, he underwent Lexiscan MPI in 11/2021 that showed no evidence of ischemia or infarction and was overall low risk.  CT attenuation corrected images showed minimal coronary calcification and moderate aortic atherosclerosis.  He underwent shoulder arthroscopy in 02/2022 without cardiac complication.  He was seen by EP in 02/2022 with EKG showing sinus rhythm and narrow QRS with a prolonged PR interval.  Continued monitoring and conservative therapy was recommended with no indication for permanent pacing.  He has since undergone excisional left total hip arthroplasty with antibiotic spacer placement in 05/2022 followed by removal of antibiotic spacer and left total hip revision arthroplasty in 09/2022.  Deep wound cultures showed no growth.  He was last seen in the office on 03/07/2023, reporting an increase in lower extremity swelling and was taking furosemide 20 mg daily.  He was without chest pain, dyspnea, orthopnea, or abdominal distention.  His weight was up 9 pounds when compared to his clinic visit in 02/2022.  It was recommended he increase furosemide to 40 mg daily.  Due to a slightly uptrending BUN/serum creatinine, furosemide was subsequently reduced back to 20 mg daily.  Echo on 03/21/2023 showed an EF of 60-65%, no regional wall motion abnormalities, low normal RV systolic function mildly enlarged RV cavity size, moderate biatrial enlargement, mild to moderate mitral regurgitation, moderate to severe tricuspid regurgitation, aortic valve sclerosis without evidence of stenosis, and an estimated right atrial pressure of 15 mmHg.  Echo results were reviewed with his  primary cardiologist with recommendation to increase furosemide back to 40 mg daily and pursue a repeat limited echo in 3 to 4 months to reevaluate tricuspid regurgitation.  He comes in doing well and is without symptoms of angina or  cardiac decompensation.  No significant dyspnea.  No palpitations, dizziness, presyncope, or syncope.  No significant lower extremity swelling or abdominal distention.  No progressive orthopnea or early satiety.  Reports vigorous urine output on furosemide.  Weight remains stable.  Exercising at the gym.  Now on atorvastatin 10 mg.  Overall, feels well at this time.   Labs independently reviewed: 04/2023 - TC 173, TG 115, HDL 52, LDL 100, A1c 7.1 03/2023 - potassium 4.1, BUN 16, SCr 0.81 12/2022 - Hgb 12.4, PLT 134, TSH normal, albumin 4.3, AST/ALT normal   Past Medical History:  Diagnosis Date   Arthritis    Asthma    Chronic kidney disease    Diabetes mellitus without complication (HCC)    type 2   DJD (degenerative joint disease)    Dysrhythmia    junctional tachycardia and incomplete heart block   Edema of left lower extremity 07/04/2022   Gout    Heart block    Lung nodules    a. 09/2021 CT chest: Interval decrease in size and number of bilateral lung nodules, likely consistent with sequelae associated with an infectious/inflammatory process.   MSSA bacteremia 06/2021   Rotator cuff tear 08/26/2021   Subacute bacterial endocarditis (SBE)    a. 06/2021 TEE: mobile mass attached to the tricuspid valve-->Abx rx-->09/2021 TEE: EF 55-60%, no rwma, nl RV fxn, mild-mod RAE, mild MR, mobile echodense 11x21mm mass in the TV apparatus-->conservative rx per TCTS.    Past Surgical History:  Procedure Laterality Date   ACHILLES TENDON REPAIR Left    many years ago per pt   ANTERIOR HIP REVISION Left 10/06/2022   Procedure: REMOVAL OF LEFT HIP ANTIBIOTIC SPACER, LEFT TOTAL HIP REVISION ARTHROPLASTY;  Surgeon: Kathryne Hitch, MD;  Location: [facility] ORS;  Service: Orthopedics;  Laterality: Left;   BIOPSY  07/16/2021   Procedure: BIOPSY;  Surgeon: Imogene Burn, MD;  Location: [facility] ENDOSCOPY;  Service: Gastroenterology;;  EGD and COLON   COLONOSCOPY N/A 07/16/2021   Procedure: COLONOSCOPY;   Surgeon: Imogene Burn, MD;  Location: [facility] ENDOSCOPY;  Service: Gastroenterology;  Laterality: N/A;   COLONOSCOPY WITH PROPOFOL N/A 07/16/2021   Procedure: COLONOSCOPY WITH PROPOFOL;  Surgeon: Imogene Burn, MD;  Location: [facility] ENDOSCOPY;  Service: Gastroenterology;  Laterality: N/A;   ESOPHAGOGASTRODUODENOSCOPY (EGD) WITH PROPOFOL N/A 07/16/2021   Procedure: ESOPHAGOGASTRODUODENOSCOPY (EGD) WITH PROPOFOL;  Surgeon: Imogene Burn, MD;  Location: [facility] ENDOSCOPY;  Service: Gastroenterology;  Laterality: N/A;   EXCISIONAL TOTAL HIP ARTHROPLASTY WITH ANTIBIOTIC SPACERS Left 05/20/2022   Procedure: EXCISIONAL LEFT TOTAL HIP ARTHROPLASTY WITH ANTIBIOTIC SPACERS;  Surgeon: Kathryne Hitch, MD;  Location: [facility] ORS;  Service: Orthopedics;  Laterality: Left;   FOOT SURGERY Right    ligaments repaired- many years ago per pt   HERNIA REPAIR     JOINT REPLACEMENT Bilateral    hip   POLYPECTOMY  07/16/2021   Procedure: POLYPECTOMY;  Surgeon: Imogene Burn, MD;  Location: [facility] ENDOSCOPY;  Service: Gastroenterology;;   SHOULDER ARTHROSCOPY WITH ROTATOR CUFF REPAIR AND SUBACROMIAL DECOMPRESSION Left 02/10/2022   Procedure: LEFT SHOULDER ARTHROSCOPY WITH EXTENSIVE DEBRIDEMENT, SUBACROMIAL DECOMPRESSION;  Surgeon: Kathryne Hitch, MD;  Location: [facility] OR;  Service: Orthopedics;  Laterality: Left;   TEE WITHOUT CARDIOVERSION N/A 07/07/2021  Procedure: TRANSESOPHAGEAL ECHOCARDIOGRAM (TEE);  Surgeon: Debbe Odea, MD;  Location: [facility] ORS;  Service: Cardiovascular;  Laterality: N/A;    Current Medications: Current Meds  Medication Sig   acetaminophen (TYLENOL) 500 MG tablet Take 1,000 mg by mouth 3 (three) times daily. Take 1000 mg by mouth in the morning. Take 500 mg by mouth in the afternoon and at bedtime.   albuterol (VENTOLIN HFA) 108 (90 Base) MCG/ACT inhaler Inhale 1-2 puffs into the lungs every 6 (six) hours as needed for wheezing or shortness of breath. (Patient taking differently: Inhale 2  puffs into the lungs as needed for wheezing or shortness of breath.)   atorvastatin (LIPITOR) 10 MG tablet Take 1 tablet (10 mg total) by mouth daily.   furosemide (LASIX) 40 MG tablet Take 1 tablet (40 mg total) by mouth daily.   gabapentin (NEURONTIN) 300 MG capsule TAKE 1 CAPSULE(300 MG) BY MOUTH TWICE DAILY   glucose blood (RIGHTEST GS550 BLOOD GLUCOSE) test strip USE AS DIRECTED TO CHECK BLOOD SUGAR UP TO 4 TIMES DAILY.   ketotifen (ZADITOR) 0.025 % ophthalmic solution Place 1 drop into both eyes daily as needed (allergies).   loratadine (CLARITIN) 10 MG tablet Take 10 mg by mouth daily as needed for allergies.   metFORMIN (GLUCOPHAGE) 1000 MG tablet Take 1 tablet (1,000 mg total) by mouth 2 (two) times daily with a meal.   potassium chloride (KLOR-CON) 10 MEQ tablet Take 1 tablet (10 mEq total) by mouth daily.    Allergies:   Patient has no known allergies.   Social History   Socioeconomic History   Marital status: Widowed    Spouse name: Not on file   Number of children: 1   Years of education: Not on file   Highest education level: Not on file  Occupational History   Not on file  Tobacco Use   Smoking status: Former    Current packs/day: 0.00    Average packs/day: 0.3 packs/day for 15.0 years (3.8 ttl pk-yrs)    Types: Cigarettes    Start date: 8    Quit date: 2007    Years since quitting: 17.6   Smokeless tobacco: Never  Vaping Use   Vaping status: Never Used  Substance and Sexual Activity   Alcohol use: Not Currently    Alcohol/week: 20.0 standard drinks of alcohol    Types: 20 Cans of beer per week    Comment: couple of glasses of wine on the weekends   Drug use: Not Currently   Sexual activity: Not on file  Other Topics Concern   Not on file  Social History Narrative   Not on file   Social Determinants of Health   Financial Resource Strain: Not on file  Food Insecurity: No Food Insecurity (10/06/2022)   Hunger Vital Sign    Worried About Running Out of  Food in the Last Year: Never true    Ran Out of Food in the Last Year: Never true  Transportation Needs: No Transportation Needs (10/06/2022)   PRAPARE - Administrator, Civil Service (Medical): No    Lack of Transportation (Non-Medical): No  Physical Activity: Not on file  Stress: Not on file  Social Connections: Not on file     Family History:  The patient's family history includes Breast cancer in his sister; Diabetes in his brother; Heart attack (age of onset: 14) in his brother; Hyperlipidemia in his brother; Hypertension in his brother; Lung cancer in his father; Other in his mother;  Psoriasis in his sister.  ROS:   12-point review of systems is negative unless otherwise noted in the HPI.   EKGs/Labs/Other Studies Reviewed:    Studies reviewed were summarized above. The additional studies were reviewed today:  2D echo 03/21/2023: 1. Left ventricular ejection fraction, by estimation, is 60 to 65%. The  left ventricle has normal function. The left ventricle has no regional  wall motion abnormalities. Left ventricular diastolic parameters are  indeterminate. The average left  ventricular global longitudinal strain is -17.6 %.   2. Right ventricular systolic function is low normal. The right  ventricular size is mildly enlarged. Tricuspid regurgitation signal is  inadequate for assessing PA pressure.   3. Left atrial size was moderately dilated.   4. Right atrial size was moderately dilated.   5. The mitral valve is normal in structure. Mild to moderate mitral valve  regurgitation. No evidence of mitral stenosis.   6. Tricuspid valve is not well visualized. Unable to exclude underlying  valve pathology. Regurgitation is moderate to severe. Consider TEE if  clinically indicated.   7. The aortic valve is tricuspid. Aortic valve regurgitation is not  visualized. Aortic valve sclerosis is present, with no evidence of aortic  valve stenosis.   8. The inferior vena cava  is dilated in size with <50% respiratory  variability, suggesting right atrial pressure of 15 mmHg.  __________  Eugenie Birks MPI 12/06/2021:   The study is normal. The study is low risk.   No ST deviation was noted.   LV perfusion is normal. There is no evidence of ischemia. There is no evidence of infarction.   Left ventricular function is normal. End diastolic cavity size is normal. End systolic cavity size is normal.   CT attenuation images show evidence of moderate aortic calcifications and minimal coronary calcifications. __________   2D echo 10/07/2021: 1. Left ventricular ejection fraction, by estimation, is 55 to 60%. The  left ventricle has normal function. The left ventricle has no regional  wall motion abnormalities. Left ventricular diastolic parameters are  indeterminate.   2. Right ventricular systolic function is normal. The right ventricular  size is not well visualized.   3. Right atrial size was mild to moderately dilated.   4. The mitral valve is grossly normal. Mild mitral valve regurgitation.   5. There is a mobile echodense (11 x 7 mm) mass in the tricuspid valve  apparatus (clip 44, 51). Given history of tricuspid mass, clinical  correlation advised.   6. The aortic valve was not well visualized. Aortic valve regurgitation  is not visualized.   7. The inferior vena cava is dilated in size with <50% respiratory  variability, suggesting right atrial pressure of 15 mmHg.  __________   TEE 07/07/2021: 1. Left ventricular ejection fraction, by estimation, is 55 to 60%. The  left ventricle has normal function.   2. Right ventricular systolic function is normal. The right ventricular  size is normal.   3. No left atrial/left atrial appendage thrombus was detected.   4. The mitral valve is normal in structure. Mild mitral valve  regurgitation.   5. There is a mobile mass attached to the tricuspid valve (clip 132)  consistent with a vegetation given the current  clinical context.. The  tricuspid valve is degenerative.   6. The aortic valve is tricuspid. Aortic valve regurgitation is not  visualized.  __________   2D echo 07/04/2021: 1. Left ventricular ejection fraction, by estimation, is 55 to 60%. The  left  ventricle has normal function. Left ventricular endocardial border  not optimally defined to evaluate regional wall motion. There is mild left  ventricular hypertrophy. Left  ventricular diastolic parameters are indeterminate.   2. Right ventricular systolic function is normal. The right ventricular  size is normal. Tricuspid regurgitation signal is inadequate for assessing  PA pressure.   3. Left atrial size was mildly dilated.   4. Right atrial size was mildly dilated.   5. The mitral valve is normal in structure. No evidence of mitral valve  regurgitation. No evidence of mitral stenosis.   6. The aortic valve is normal in structure. Aortic valve regurgitation is  not visualized. Mild aortic valve sclerosis is present, with no evidence  of aortic valve stenosis.   7. Aortic dilatation noted. There is borderline dilatation of the aortic  root and of the ascending aorta, measuring 38 mm.   8. The tricuspid was not well visualized. However, there is likely a  mobile vergetation noted on the short axis images. Recommend a TEE.    EKG:  EKG is ordered today.  The EKG ordered today demonstrates NSR, 80 bpm, first-degree AV block consistent with prior tracings, baseline artifact, no acute ST-T changes  Recent Labs: 01/04/2023: ALT 12; TSH 2.180 01/10/2023: Hemoglobin 12.4; Platelets 134 04/11/2023: BUN 16; Creatinine, Ser 0.81; Potassium 4.1; Sodium 137  Recent Lipid Panel    Component Value Date/Time   CHOL 173 04/18/2023 1128   TRIG 115 04/18/2023 1128   HDL 52 04/18/2023 1128   CHOLHDL 3.3 04/18/2023 1128   CHOLHDL NOT CALCULATED 07/08/2021 0628   VLDL 20 07/08/2021 0628   LDLCALC 100 (H) 04/18/2023 1128    PHYSICAL EXAM:     VS:  BP 116/70 (BP Location: Left Arm, Patient Position: Sitting, Cuff Size: Large)   Pulse 80   Ht 5\' 10"  (1.778 m)   Wt 244 lb 6.4 oz (110.9 kg)   SpO2 97%   BMI 35.07 kg/m   BMI: Body mass index is 35.07 kg/m.  Physical Exam Vitals reviewed.  Constitutional:      Appearance: He is well-developed.  HENT:     Head: Normocephalic and atraumatic.  Eyes:     General:        Right eye: No discharge.        Left eye: No discharge.  Neck:     Vascular: No JVD.  Cardiovascular:     Rate and Rhythm: Normal rate and regular rhythm.     Pulses:          Posterior tibial pulses are 2+ on the right side and 2+ on the left side.     Heart sounds: Normal heart sounds, S1 normal and S2 normal. Heart sounds not distant. No midsystolic click and no opening snap. No murmur heard.    No friction rub.  Pulmonary:     Effort: Pulmonary effort is normal. No respiratory distress.     Breath sounds: Normal breath sounds. No decreased breath sounds, wheezing or rales.  Chest:     Chest wall: No tenderness.  Abdominal:     General: There is no distension.     Palpations: Abdomen is soft.     Tenderness: There is no abdominal tenderness.  Musculoskeletal:     Cervical back: Normal range of motion.     Right lower leg: No edema.     Left lower leg: No edema.  Skin:    General: Skin is warm and dry.  Nails: There is no clubbing.  Neurological:     Mental Status: He is alert and oriented to person, place, and time.  Psychiatric:        Speech: Speech normal.        Behavior: Behavior normal.        Thought Content: Thought content normal.        Judgment: Judgment normal.     Wt Readings from Last 3 Encounters:  05/12/23 244 lb 6.4 oz (110.9 kg)  04/18/23 244 lb 3.2 oz (110.8 kg)  03/07/23 241 lb 12.8 oz (109.7 kg)     ASSESSMENT & PLAN:   Hypervolemia with moderate to severe tricuspid regurgitation: Volume status much improved.  No significant lower extremity swelling or  dyspnea.  He remains on furosemide 40 mg daily with KCl 10 mEq daily.  Check BMP.  If renal function is stable would continue current diuretic regimen with recommendation to follow-up echo as scheduled in 06/2023 to reevaluate tricuspid regurgitation.  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  Has been evaluated by ID and cardiothoracic surgery with recommendation for conservative management of residual multiple tricuspid valve mass.  History of complete heart block: Resolved and asymptomatic. Evaluated by EP with no indication for pacemaker implantation. Stable first-degree AV block. Avoid AV nodal blocking medications.   Coronary artery calcification/HLD: LDL 100 earlier this month.  Target LDL < 70.  Now on atorvastatin 10 mg.  Look to obtain fasting lipid panel and LFT at next visit.    Disposition: F/u with Dr. Kirke Corin or an APP after echo, and EP as directed.   Medication Adjustments/Labs and Tests Ordered: Current medicines are reviewed at length with the patient today.  Concerns regarding medicines are outlined above. Medication changes, Labs and Tests ordered today are summarized above and listed in the Patient Instructions accessible in Encounters.   Signed, Eula Listen, PA-C 05/12/2023 12:39 PM     [facility] [place] [address] [place], [place] [address] [phone]

## 2023-05-12 ENCOUNTER — Encounter: Payer: Self-pay | Admitting: Physician Assistant

## 2023-05-12 ENCOUNTER — Ambulatory Visit: Payer: Medicaid Other | Admitting: Physician Assistant

## 2023-05-12 VITALS — BP 116/70 | HR 80 | Ht 70.0 in | Wt 244.4 lb

## 2023-05-12 DIAGNOSIS — E785 Hyperlipidemia, unspecified: Secondary | ICD-10-CM | POA: Diagnosis not present

## 2023-05-12 DIAGNOSIS — I2584 Coronary atherosclerosis due to calcified coronary lesion: Secondary | ICD-10-CM | POA: Diagnosis not present

## 2023-05-12 DIAGNOSIS — E871 Hypo-osmolality and hyponatremia: Secondary | ICD-10-CM

## 2023-05-12 DIAGNOSIS — I078 Other rheumatic tricuspid valve diseases: Secondary | ICD-10-CM

## 2023-05-12 DIAGNOSIS — I361 Nonrheumatic tricuspid (valve) insufficiency: Secondary | ICD-10-CM | POA: Diagnosis not present

## 2023-05-12 DIAGNOSIS — E8770 Fluid overload, unspecified: Secondary | ICD-10-CM

## 2023-05-12 DIAGNOSIS — E876 Hypokalemia: Secondary | ICD-10-CM

## 2023-05-12 DIAGNOSIS — I442 Atrioventricular block, complete: Secondary | ICD-10-CM | POA: Diagnosis not present

## 2023-05-12 DIAGNOSIS — I2510 Atherosclerotic heart disease of native coronary artery without angina pectoris: Secondary | ICD-10-CM | POA: Diagnosis not present

## 2023-05-12 NOTE — Patient Instructions (Signed)
Medication Instructions:  Your Physician recommend you continue on your current medication as directed.    *If you need a refill on your cardiac medications before your next appointment, please call your pharmacy*   Lab Work: Your provider would like for you to have following labs drawn today BMET.   If you have labs (blood work) drawn today and your tests are completely normal, you will receive your results only by: MyChart Message (if you have MyChart) OR A paper copy in the mail If you have any lab test that is abnormal or we need to change your treatment, we will call you to review the results.  Testing/Procedures: None  Follow-Up: At [facility], you and your health needs are our priority.  As part of our continuing mission to provide you with exceptional heart care, we have created designated Provider Care Teams.  These Care Teams include your primary Cardiologist (physician) and Advanced Practice Providers (APPs -  Physician Assistants and Nurse Practitioners) who all work together to provide you with the care you need, when you need it.  We recommend signing up for the patient portal called "MyChart".  Sign up information is provided on this After Visit Summary.  MyChart is used to connect with patients for Virtual Visits (Telemedicine).  Patients are able to view lab/test results, encounter notes, upcoming appointments, etc.  Non-urgent messages can be sent to your provider as well.   To learn more about what you can do with MyChart, go to [email or web address].    Your next appointment:   After Echo procedure on Jun 27, 2023.  Provider:   You may see Lorine Bears, MD or one of the following Advanced Practice Providers on your designated Care Team:   Nicolasa Ducking, NP Eula Listen, PA-C Cadence Fransico Michael, PA-C Charlsie Quest, NP

## 2023-05-13 LAB — BASIC METABOLIC PANEL
BUN/Creatinine Ratio: 17 (ref 10–24)
BUN: 15 mg/dL (ref 8–27)
CO2: 23 mmol/L (ref 20–29)
Calcium: 9.6 mg/dL (ref 8.6–10.2)
Chloride: 99 mmol/L (ref 96–106)
Creatinine, Ser: 0.88 mg/dL (ref 0.76–1.27)
Glucose: 113 mg/dL — ABNORMAL HIGH (ref 70–99)
Potassium: 4.5 mmol/L (ref 3.5–5.2)
Sodium: 139 mmol/L (ref 134–144)
eGFR: 98 mL/min/{1.73_m2} (ref >59)

## 2023-05-14 DIAGNOSIS — Z419 Encounter for procedure for purposes other than remedying health state, unspecified: Secondary | ICD-10-CM | POA: Diagnosis not present

## 2023-05-16 ENCOUNTER — Other Ambulatory Visit: Payer: Self-pay

## 2023-05-16 DIAGNOSIS — Z79899 Other long term (current) drug therapy: Secondary | ICD-10-CM

## 2023-05-16 DIAGNOSIS — E8770 Fluid overload, unspecified: Secondary | ICD-10-CM

## 2023-05-18 ENCOUNTER — Other Ambulatory Visit: Payer: Self-pay | Admitting: Emergency Medicine

## 2023-05-18 DIAGNOSIS — Z79899 Other long term (current) drug therapy: Secondary | ICD-10-CM

## 2023-06-06 DIAGNOSIS — E8770 Fluid overload, unspecified: Secondary | ICD-10-CM | POA: Diagnosis not present

## 2023-06-06 DIAGNOSIS — Z79899 Other long term (current) drug therapy: Secondary | ICD-10-CM | POA: Diagnosis not present

## 2023-06-07 ENCOUNTER — Other Ambulatory Visit: Payer: Self-pay | Admitting: Physician Assistant

## 2023-06-07 DIAGNOSIS — G629 Polyneuropathy, unspecified: Secondary | ICD-10-CM

## 2023-06-07 LAB — BASIC METABOLIC PANEL
BUN/Creatinine Ratio: 15 (ref 10–24)
BUN: 13 mg/dL (ref 8–27)
CO2: 21 mmol/L (ref 20–29)
Calcium: 9.4 mg/dL (ref 8.6–10.2)
Chloride: 100 mmol/L (ref 96–106)
Creatinine, Ser: 0.89 mg/dL (ref 0.76–1.27)
Glucose: 194 mg/dL — ABNORMAL HIGH (ref 70–99)
Potassium: 4 mmol/L (ref 3.5–5.2)
Sodium: 140 mmol/L (ref 134–144)
eGFR: 98 mL/min/{1.73_m2} (ref >59)

## 2023-06-13 DIAGNOSIS — Z419 Encounter for procedure for purposes other than remedying health state, unspecified: Secondary | ICD-10-CM | POA: Diagnosis not present

## 2023-06-18 ENCOUNTER — Other Ambulatory Visit: Payer: Self-pay | Admitting: Physician Assistant

## 2023-06-18 DIAGNOSIS — R600 Localized edema: Secondary | ICD-10-CM

## 2023-06-27 ENCOUNTER — Ambulatory Visit: Payer: Medicaid Other | Attending: Physician Assistant

## 2023-06-27 DIAGNOSIS — I071 Rheumatic tricuspid insufficiency: Secondary | ICD-10-CM

## 2023-06-27 LAB — ECHOCARDIOGRAM LIMITED: S' Lateral: 3.4 cm

## 2023-06-27 MED ORDER — PERFLUTREN LIPID MICROSPHERE
1.0000 mL | INTRAVENOUS | Status: AC | PRN
Start: 2023-06-27 — End: 2023-06-27
  Administered 2023-06-27: 2 mL via INTRAVENOUS

## 2023-06-28 NOTE — Progress Notes (Unsigned)
Cardiology Office Note    Date:  06/29/2023   ID:  Joshua Cole, DOB [birth year], MRN [id number]  PCP:  Debera Lat, PA-C  Cardiologist:  Lorine Bears, MD  Electrophysiologist:  Lanier Prude, MD   Chief Complaint: Follow-up  History of Present Illness:   Joshua Cole is a 62 y.o. 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   He was admitted in 06/2021 with weakness and hip pain.  He was found to have MSSA bacteremia.  CTA of the chest was negative for PE.  In the setting of bacteremia, TEE showed a tricuspid valve vegetation.  He developed complete heart block with junctional escape and did not require temporary transvenous pacing.  He was transferred to [facility] and seen by CT surgery with recommendation for ongoing antibiotic therapy and outpatient follow-up.  Hospital course was complicated by anemia requiring 2 units of packed red blood cells.  He followed up with ID in the outpatient setting.  Following completion of antibiotics, repeat blood cultures were drawn and negative.  In follow-up with EP in 08/2021, he was in an accelerated junctional rhythm with recommendation for conservative therapy.  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  He was reevaluated by CT surgery in 10/2021, and based on the location of the mass, it was not felt to be a good candidate for angio VAC debridement.  There was no strong indication for open debridement and it was recommended to continue conservative management.  He was seen in the  office in 11/2021 noting chronic dyspnea, and for preoperative cardiac risk stratification.  To further risk stratify, he underwent Lexiscan MPI in 11/2021 that showed no evidence of ischemia or infarction and was overall low risk.  CT attenuation corrected images showed minimal coronary calcification and moderate aortic atherosclerosis.  He underwent shoulder arthroscopy in 02/2022 without cardiac complication.  He was seen by EP in 02/2022 with EKG showing sinus rhythm and narrow QRS with a prolonged PR interval.  Continued monitoring and conservative therapy was recommended with no indication for permanent pacing.  He has since undergone excisional left total hip arthroplasty with antibiotic spacer placement in 05/2022 followed by removal of antibiotic spacer and left total hip revision arthroplasty in 09/2022.  Deep wound cultures showed no growth.   He was seen in the office on 03/07/2023, reporting an increase in lower extremity swelling and was taking furosemide 20 mg daily.  He was without chest pain, dyspnea, orthopnea, or abdominal distention.  His weight was up 9 pounds when compared to his clinic visit in 02/2022.  It was recommended he increase furosemide to 40 mg daily.  Due to a slightly uptrending BUN/serum creatinine, furosemide was subsequently reduced back to 20 mg daily.  Echo on 03/21/2023 showed an EF of 60-65%, no regional wall motion abnormalities, low normal RV systolic function mildly enlarged RV cavity size, moderate biatrial enlargement, mild to moderate mitral regurgitation, moderate to severe tricuspid regurgitation, aortic valve sclerosis without evidence of stenosis, and an estimated right atrial pressure of 15 mmHg.  Echo results were reviewed with his primary  cardiologist with recommendation to increase furosemide back to 40 mg daily and pursue a repeat limited echo in 3 to 4 months to reevaluate tricuspid regurgitation.  He was last seen in the office in 04/2023 and reported no significant  dyspnea, or evidence of volume overload.  He was continued on furosemide 40 mg daily with follow-up labs showing stable renal function.  Limited echo on 06/27/2023 showed an EF of 60 to 65%, no regional wall motion abnormalities, normal RV systolic function with moderately enlarged ventricular cavity size, mild mitral regurgitation, poorly visualized tricuspid valve with moderate to severe regurgitation (though difficult to estimate), and an estimated right atrial pressure of 8 mmHg.  He comes in doing well from a cardiac perspective and is without symptoms of angina or cardiac decompensation.  Dyspnea is unchanged when compared to his visit in 04/2023, improved from visit in 02/2023.  No lower extremity swelling, abdominal distention, or progressive orthopnea.  Does try and watch his salt intake.  Continues to note vigorous urine output with furosemide.  Weight has been largely stable.  Remains active, exercising at the gym.  Adherent and tolerating atorvastatin.  No history of illicit substance use, including IV drugs.   Labs independently reviewed: 05/2023 - BUN 13, serum creatinine 0.89, potassium 4.0 04/2023 - TC 173, TG 115, HDL 52, LDL 100, A1c 7.1 12/2022 - Hgb 12.4, PLT 134, TSH normal, albumin 4.3, AST/ALT normal  Past Medical History:  Diagnosis Date   Arthritis    Asthma    Chronic kidney disease    Diabetes mellitus without complication (HCC)    type 2   DJD (degenerative joint disease)    Dysrhythmia    junctional tachycardia and incomplete heart block   Edema of left lower extremity 07/04/2022   Gout    Heart block    Lung nodules    a. 09/2021 CT chest: Interval decrease in size and number of bilateral lung nodules, likely consistent with sequelae associated with an infectious/inflammatory process.   MSSA bacteremia 06/2021   Rotator cuff tear 08/26/2021   Subacute bacterial endocarditis (SBE)    a. 06/2021 TEE: mobile mass attached to the tricuspid valve-->Abx rx-->09/2021 TEE:  EF 55-60%, no rwma, nl RV fxn, mild-mod RAE, mild MR, mobile echodense 11x40mm mass in the TV apparatus-->conservative rx per TCTS.    Past Surgical History:  Procedure Laterality Date   ACHILLES TENDON REPAIR Left    many years ago per pt   ANTERIOR HIP REVISION Left 10/06/2022   Procedure: REMOVAL OF LEFT HIP ANTIBIOTIC SPACER, LEFT TOTAL HIP REVISION ARTHROPLASTY;  Surgeon: Kathryne Hitch, MD;  Location: [facility] ORS;  Service: Orthopedics;  Laterality: Left;   BIOPSY  07/16/2021   Procedure: BIOPSY;  Surgeon: Imogene Burn, MD;  Location: [facility] ENDOSCOPY;  Service: Gastroenterology;;  EGD and COLON   COLONOSCOPY N/A 07/16/2021   Procedure: COLONOSCOPY;  Surgeon: Imogene Burn, MD;  Location: [facility] ENDOSCOPY;  Service: Gastroenterology;  Laterality: N/A;   COLONOSCOPY WITH PROPOFOL N/A 07/16/2021   Procedure: COLONOSCOPY WITH PROPOFOL;  Surgeon: Imogene Burn, MD;  Location: [facility] ENDOSCOPY;  Service: Gastroenterology;  Laterality: N/A;   ESOPHAGOGASTRODUODENOSCOPY (EGD) WITH PROPOFOL N/A 07/16/2021   Procedure: ESOPHAGOGASTRODUODENOSCOPY (EGD) WITH PROPOFOL;  Surgeon: Imogene Burn, MD;  Location: [facility] ENDOSCOPY;  Service: Gastroenterology;  Laterality: N/A;   EXCISIONAL TOTAL HIP ARTHROPLASTY WITH ANTIBIOTIC SPACERS Left 05/20/2022   Procedure: EXCISIONAL LEFT TOTAL HIP ARTHROPLASTY WITH ANTIBIOTIC SPACERS;  Surgeon: Kathryne Hitch, MD;  Location: [facility]  ORS;  Service: Orthopedics;  Laterality: Left;   FOOT SURGERY Right    ligaments repaired- many years ago per pt   HERNIA REPAIR     JOINT REPLACEMENT Bilateral    hip   POLYPECTOMY  07/16/2021   Procedure: POLYPECTOMY;  Surgeon: Imogene Burn, MD;  Location: [facility] ENDOSCOPY;  Service: Gastroenterology;;   SHOULDER ARTHROSCOPY WITH ROTATOR CUFF REPAIR AND SUBACROMIAL DECOMPRESSION Left 02/10/2022   Procedure: LEFT SHOULDER ARTHROSCOPY WITH EXTENSIVE DEBRIDEMENT, SUBACROMIAL DECOMPRESSION;  Surgeon: Kathryne Hitch, MD;  Location:  [facility] OR;  Service: Orthopedics;  Laterality: Left;   TEE WITHOUT CARDIOVERSION N/A 07/07/2021   Procedure: TRANSESOPHAGEAL ECHOCARDIOGRAM (TEE);  Surgeon: Debbe Odea, MD;  Location: [facility] ORS;  Service: Cardiovascular;  Laterality: N/A;    Current Medications: Current Meds  Medication Sig   acetaminophen (TYLENOL) 500 MG tablet Take 1,000 mg by mouth 3 (three) times daily. Take 1000 mg by mouth in the morning. Take 500 mg by mouth in the afternoon and at bedtime.   albuterol (VENTOLIN HFA) 108 (90 Base) MCG/ACT inhaler Inhale 1-2 puffs into the lungs every 6 (six) hours as needed for wheezing or shortness of breath. (Patient taking differently: Inhale 2 puffs into the lungs as needed for wheezing or shortness of breath.)   atorvastatin (LIPITOR) 10 MG tablet Take 1 tablet (10 mg total) by mouth daily.   furosemide (LASIX) 40 MG tablet TAKE 1 TABLET(40 MG) BY MOUTH DAILY   gabapentin (NEURONTIN) 300 MG capsule TAKE 1 CAPSULE(300 MG) BY MOUTH TWICE DAILY   glucose blood (RIGHTEST GS550 BLOOD GLUCOSE) test strip USE AS DIRECTED TO CHECK BLOOD SUGAR UP TO 4 TIMES DAILY.   ketotifen (ZADITOR) 0.025 % ophthalmic solution Place 1 drop into both eyes daily as needed (allergies).   loratadine (CLARITIN) 10 MG tablet Take 10 mg by mouth daily as needed for allergies.   metFORMIN (GLUCOPHAGE) 1000 MG tablet Take 1 tablet (1,000 mg total) by mouth 2 (two) times daily with a meal.   potassium chloride (KLOR-CON) 10 MEQ tablet TAKE 1 TABLET(10 MEQ) BY MOUTH DAILY    Allergies:   Patient has no known allergies.   Social History   Socioeconomic History   Marital status: Widowed    Spouse name: Not on file   Number of children: 1   Years of education: Not on file   Highest education level: Not on file  Occupational History   Not on file  Tobacco Use   Smoking status: Former    Current packs/day: 0.00    Average packs/day: 0.3 packs/day for 15.0 years (3.8 ttl pk-yrs)    Types: Cigarettes     Start date: 51    Quit date: 2007    Years since quitting: 17.8   Smokeless tobacco: Never  Vaping Use   Vaping status: Never Used  Substance and Sexual Activity   Alcohol use: Not Currently    Alcohol/week: 20.0 standard drinks of alcohol    Types: 20 Cans of beer per week    Comment: couple of glasses of wine on the weekends   Drug use: Not Currently   Sexual activity: Not on file  Other Topics Concern   Not on file  Social History Narrative   Not on file   Social Determinants of Health   Financial Resource Strain: Not on file  Food Insecurity: No Food Insecurity (10/06/2022)   Hunger Vital Sign    Worried About Running Out of Food in the Last Year: Never true  Ran Out of Food in the Last Year: Never true  Transportation Needs: No Transportation Needs (10/06/2022)   PRAPARE - Administrator, Civil Service (Medical): No    Lack of Transportation (Non-Medical): No  Physical Activity: Not on file  Stress: Not on file  Social Connections: Not on file     Family History:  The patient's family history includes Breast cancer in his sister; Diabetes in his brother; Heart attack (age of onset: 27) in his brother; Hyperlipidemia in his brother; Hypertension in his brother; Lung cancer in his father; Other in his mother; Psoriasis in his sister.  ROS:   12-point review of systems is negative unless otherwise noted in the HPI.   EKGs/Labs/Other Studies Reviewed:    Studies reviewed were summarized above. The additional studies were reviewed today:  Limited echo 06/27/2023: 1. Left ventricular ejection fraction, by estimation, is 60 to 65%. Left  ventricular ejection fraction by PLAX is 56 %. The left ventricle has  normal function. The left ventricle has no regional wall motion  abnormalities.   2. Right ventricular systolic function is normal. The right ventricular  size is moderately enlarged. Tricuspid regurgitation signal is inadequate  for assessing PA  pressure.   3. The mitral valve is normal in structure. Mild mitral valve  regurgitation. No evidence of mitral stenosis.   4. The tricuspid valve is not well visualized. Tricuspid valve  regurgitation is moderate to severe, though difficult to estimate. No  evidence of tricuspid stenosis   5. The aortic valve has an indeterminant number of cusps. Aortic valve  regurgitation is not visualized. No aortic stenosis is present.   6. The inferior vena cava is dilated in size with >50% respiratory  variability, suggesting right atrial pressure of 8 mmHg.  __________  2D echo 03/21/2023: 1. Left ventricular ejection fraction, by estimation, is 60 to 65%. The  left ventricle has normal function. The left ventricle has no regional  wall motion abnormalities. Left ventricular diastolic parameters are  indeterminate. The average left  ventricular global longitudinal strain is -17.6 %.   2. Right ventricular systolic function is low normal. The right  ventricular size is mildly enlarged. Tricuspid regurgitation signal is  inadequate for assessing PA pressure.   3. Left atrial size was moderately dilated.   4. Right atrial size was moderately dilated.   5. The mitral valve is normal in structure. Mild to moderate mitral valve  regurgitation. No evidence of mitral stenosis.   6. Tricuspid valve is not well visualized. Unable to exclude underlying  valve pathology. Regurgitation is moderate to severe. Consider TEE if  clinically indicated.   7. The aortic valve is tricuspid. Aortic valve regurgitation is not  visualized. Aortic valve sclerosis is present, with no evidence of aortic  valve stenosis.   8. The inferior vena cava is dilated in size with <50% respiratory  variability, suggesting right atrial pressure of 15 mmHg.  __________   Eugenie Birks MPI 12/06/2021:   The study is normal. The study is low risk.   No ST deviation was noted.   LV perfusion is normal. There is no evidence of ischemia.  There is no evidence of infarction.   Left ventricular function is normal. End diastolic cavity size is normal. End systolic cavity size is normal.   CT attenuation images show evidence of moderate aortic calcifications and minimal coronary calcifications. __________   2D echo 10/07/2021: 1. Left ventricular ejection fraction, by estimation, is 55 to 60%. The  left ventricle has normal function. The left ventricle has no regional  wall motion abnormalities. Left ventricular diastolic parameters are  indeterminate.   2. Right ventricular systolic function is normal. The right ventricular  size is not well visualized.   3. Right atrial size was mild to moderately dilated.   4. The mitral valve is grossly normal. Mild mitral valve regurgitation.   5. There is a mobile echodense (11 x 7 mm) mass in the tricuspid valve  apparatus (clip 44, 51). Given history of tricuspid mass, clinical  correlation advised.   6. The aortic valve was not well visualized. Aortic valve regurgitation  is not visualized.   7. The inferior vena cava is dilated in size with <50% respiratory  variability, suggesting right atrial pressure of 15 mmHg.  __________   TEE 07/07/2021: 1. Left ventricular ejection fraction, by estimation, is 55 to 60%. The  left ventricle has normal function.   2. Right ventricular systolic function is normal. The right ventricular  size is normal.   3. No left atrial/left atrial appendage thrombus was detected.   4. The mitral valve is normal in structure. Mild mitral valve  regurgitation.   5. There is a mobile mass attached to the tricuspid valve (clip 132)  consistent with a vegetation given the current clinical context.. The  tricuspid valve is degenerative.   6. The aortic valve is tricuspid. Aortic valve regurgitation is not  visualized.  __________   2D echo 07/04/2021: 1. Left ventricular ejection fraction, by estimation, is 55 to 60%. The  left ventricle has normal  function. Left ventricular endocardial border  not optimally defined to evaluate regional wall motion. There is mild left  ventricular hypertrophy. Left  ventricular diastolic parameters are indeterminate.   2. Right ventricular systolic function is normal. The right ventricular  size is normal. Tricuspid regurgitation signal is inadequate for assessing  PA pressure.   3. Left atrial size was mildly dilated.   4. Right atrial size was mildly dilated.   5. The mitral valve is normal in structure. No evidence of mitral valve  regurgitation. No evidence of mitral stenosis.   6. The aortic valve is normal in structure. Aortic valve regurgitation is  not visualized. Mild aortic valve sclerosis is present, with no evidence  of aortic valve stenosis.   7. Aortic dilatation noted. There is borderline dilatation of the aortic  root and of the ascending aorta, measuring 38 mm.   8. The tricuspid was not well visualized. However, there is likely a  mobile vergetation noted on the short axis images. Recommend a TEE.    EKG:  EKG is not ordered today.    Recent Labs: 01/04/2023: ALT 12; TSH 2.180 01/10/2023: Hemoglobin 12.4; Platelets 134 06/06/2023: BUN 13; Creatinine, Ser 0.89; Potassium 4.0; Sodium 140  Recent Lipid Panel    Component Value Date/Time   CHOL 173 04/18/2023 1128   TRIG 115 04/18/2023 1128   HDL 52 04/18/2023 1128   CHOLHDL 3.3 04/18/2023 1128   CHOLHDL NOT CALCULATED 07/08/2021 0628   VLDL 20 07/08/2021 0628   LDLCALC 100 (H) 04/18/2023 1128    PHYSICAL EXAM:    VS:  BP 114/60 (BP Location: Left Arm, Patient Position: Sitting, Cuff Size: Large)   Pulse 79   Ht 5\' 10"  (1.778 m)   Wt 249 lb 3.2 oz (113 kg)   SpO2 98%   BMI 35.76 kg/m   BMI: Body mass index is 35.76 kg/m.  Physical Exam Vitals reviewed.  Constitutional:      Appearance: He is well-developed.  HENT:     Head: Normocephalic and atraumatic.  Eyes:     General:        Right eye: No discharge.         Left eye: No discharge.  Neck:     Vascular: No JVD.  Cardiovascular:     Rate and Rhythm: Normal rate and regular rhythm.     Heart sounds: S1 normal and S2 normal. Heart sounds not distant. No midsystolic click and no opening snap. Murmur heard.     Systolic murmur is present with a grade of 2/6 at the upper left sternal border.     No friction rub.  Pulmonary:     Effort: Pulmonary effort is normal. No respiratory distress.     Breath sounds: Normal breath sounds. No decreased breath sounds, wheezing, rhonchi or rales.  Chest:     Chest wall: No tenderness.  Abdominal:     General: There is no distension.  Musculoskeletal:     Cervical back: Normal range of motion.     Right lower leg: No edema.     Left lower leg: No edema.  Skin:    General: Skin is warm and dry.     Nails: There is no clubbing.  Neurological:     Mental Status: He is alert and oriented to person, place, and time.  Psychiatric:        Speech: Speech normal.        Behavior: Behavior normal.        Thought Content: Thought content normal.        Judgment: Judgment normal.     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 Has been evaluated by ID and cardiothoracic surgery with recommendation for conservative management of residual multiple tricuspid valve mass.  Serial echocardiograms have demonstrated progressive tricuspid regurgitation, now moderate to severe by echo in 04/01/2023 and 07/02/2023.  With escalation of diuresis, his tricuspid regurgitation has remained stable with some improvement in RV systolic function.  Case discussed with Dr. Kirke Corin with recommendation to refer the patient to Dr. Lynnette Caffey, MD for consideration of  tricuspid valve repair/clipping.  Check renal function and electrolytes.  Remains on furosemide 40 mg daily with KCl 10 mEq daily.  History of complete heart block: Resolved and asymptomatic.  Evaluated by EP with no indication for pacemaker implantation.  Avoiding AV nodal blocking medications.  Coronary artery calcification/HLD: LDL 100 in 04/2023 with target LDL < 70.  Now on atorvastatin 10 mg.  Obtain CMP, lipid panel, and direct LDL.    Disposition: F/u with Dr. Kirke Corin or an APP in 3-4 months.   Medication Adjustments/Labs and Tests Ordered: Current medicines are reviewed at length with the patient today.  Concerns regarding medicines are outlined above. Medication changes, Labs and Tests ordered today are summarized above and listed in the Patient Instructions accessible in Encounters.   Signed, Eula Listen, PA-C 06/29/2023 10:16 AM     [facility] [place] [address] [place], [place] [address] [phone]

## 2023-06-29 ENCOUNTER — Ambulatory Visit: Payer: Medicaid Other | Attending: Physician Assistant | Admitting: Physician Assistant

## 2023-06-29 ENCOUNTER — Encounter: Payer: Self-pay | Admitting: Physician Assistant

## 2023-06-29 ENCOUNTER — Telehealth: Payer: Self-pay | Admitting: Physician Assistant

## 2023-06-29 VITALS — BP 114/60 | HR 79 | Ht 70.0 in | Wt 249.2 lb

## 2023-06-29 DIAGNOSIS — I2510 Atherosclerotic heart disease of native coronary artery without angina pectoris: Secondary | ICD-10-CM

## 2023-06-29 DIAGNOSIS — I078 Other rheumatic tricuspid valve diseases: Secondary | ICD-10-CM | POA: Diagnosis not present

## 2023-06-29 DIAGNOSIS — I071 Rheumatic tricuspid insufficiency: Secondary | ICD-10-CM | POA: Diagnosis not present

## 2023-06-29 DIAGNOSIS — E785 Hyperlipidemia, unspecified: Secondary | ICD-10-CM

## 2023-06-29 DIAGNOSIS — I442 Atrioventricular block, complete: Secondary | ICD-10-CM

## 2023-06-29 NOTE — Telephone Encounter (Signed)
Erroneous encounter

## 2023-06-29 NOTE — Patient Instructions (Addendum)
Medication Instructions:  No changes at this time.  *If you need a refill on your cardiac medications before your next appointment, please call your pharmacy*   Lab Work: Lipid, Direct LDL, and CMET today here in our office.   If you have labs (blood work) drawn today and your tests are completely normal, you will receive your results only by: MyChart Message (if you have MyChart) OR A paper copy in the mail If you have any lab test that is abnormal or we need to change your treatment, we will call you to review the results.   Testing/Procedures: None   Follow-Up: At [facility], you and your health needs are our priority.  As part of our continuing mission to provide you with exceptional heart care, we have created designated Provider Care Teams.  These Care Teams include your primary Cardiologist (physician) and Advanced Practice Providers (APPs -  Physician Assistants and Nurse Practitioners) who all work together to provide you with the care you need, when you need it.   Your next appointment:   3 month(s)  Provider:   Lorine Bears, MD or Eula Listen, PA-C    Other Instructions Referral placed to see Dr. Lynnette Caffey in [place]

## 2023-06-30 ENCOUNTER — Telehealth: Payer: Self-pay | Admitting: *Deleted

## 2023-06-30 LAB — COMPREHENSIVE METABOLIC PANEL
ALT: 13 [IU]/L (ref 0–44)
AST: 18 [IU]/L (ref 0–40)
Albumin: 4.8 g/dL (ref 3.8–4.9)
Alkaline Phosphatase: 120 [IU]/L (ref 44–121)
BUN/Creatinine Ratio: 18 (ref 10–24)
BUN: 16 mg/dL (ref 8–27)
Bilirubin Total: 0.7 mg/dL (ref 0.0–1.2)
CO2: 23 mmol/L (ref 20–29)
Calcium: 9.4 mg/dL (ref 8.6–10.2)
Chloride: 101 mmol/L (ref 96–106)
Creatinine, Ser: 0.88 mg/dL (ref 0.76–1.27)
Globulin, Total: 3.5 g/dL (ref 1.5–4.5)
Glucose: 116 mg/dL — ABNORMAL HIGH (ref 70–99)
Potassium: 3.9 mmol/L (ref 3.5–5.2)
Sodium: 140 mmol/L (ref 134–144)
Total Protein: 8.3 g/dL (ref 6.0–8.5)
eGFR: 98 mL/min/{1.73_m2} (ref >59)

## 2023-06-30 LAB — LIPID PANEL
Chol/HDL Ratio: 2.4 {ratio} (ref 0.0–5.0)
Cholesterol, Total: 133 mg/dL (ref 100–199)
HDL: 55 mg/dL (ref >39)
LDL Chol Calc (NIH): 60 mg/dL (ref 0–99)
Triglycerides: 93 mg/dL (ref 0–149)
VLDL Cholesterol Cal: 18 mg/dL (ref 5–40)

## 2023-06-30 LAB — LDL CHOLESTEROL, DIRECT: LDL Direct: 65 mg/dL (ref 0–99)

## 2023-06-30 NOTE — Telephone Encounter (Signed)
[facility] and reviewed referral placed for this patient to see Dr. Harland German. They requested that I please fax over Referral, Demographics, with notes. Will get all of this together and then fax to number provided.   Dr. Pricilla Handler # [phone] Fax # [phone]  Referral changed because they are not doing TR clip in [place] due to CRNA shortage.

## 2023-06-30 NOTE — Telephone Encounter (Signed)
Spoke with patient and reviewed provider request to switch referral from Dr. Lynnette Caffey to Dr. Harland German at [facility]. Unable to do at [facility] due to CRNA shortage and inability to perform the TR Clip which he may need. Advised that I would call [facility] and also see if I can schedule appt or have them call him and that I would reach back out to him with update. He verbalized understanding with no further questions at this time.

## 2023-06-30 NOTE — Telephone Encounter (Signed)
Spoke with patient and reviewed information for [facility] and sent mychart message with the phone number for his records. Advised that they will give him a call to schedule appointment once they review information we sent over to them. He verbalized understanding with no further questions at this time.

## 2023-06-30 NOTE — Addendum Note (Signed)
Addended by: Bryna Colander on: 06/30/2023 09:27 AM   Modules accepted: Orders

## 2023-07-14 DIAGNOSIS — Z419 Encounter for procedure for purposes other than remedying health state, unspecified: Secondary | ICD-10-CM | POA: Diagnosis not present

## 2023-07-16 NOTE — Progress Notes (Unsigned)
  Established patient visit  Patient: Joshua Cole   DOB: [birth year]   62 y.o. Male  MRN: [id number] Visit Date: 07/20/2023  Today's healthcare provider: Debera Lat, PA-C   No chief complaint on file.  Subjective    HPI  *** Discussed the use of AI scribe software for clinical note transcription with the patient, who gave verbal consent to proceed.  History of Present Illness               04/18/2023   10:38 AM 01/10/2023   10:16 AM 12/01/2022    2:09 PM  Depression screen PHQ 2/9  Decreased Interest 0 0 0  Down, Depressed, Hopeless  0 0  PHQ - 2 Score 0 0 0  Altered sleeping 1 1 2   Tired, decreased energy 1 1 2   Change in appetite 0 2 0  Feeling bad or failure about yourself  0 0 0  Trouble concentrating 0 0 0  Moving slowly or fidgety/restless 0 0 0  Suicidal thoughts 0 0 0  PHQ-9 Score 2 4 4   Difficult doing work/chores Not difficult at all Not difficult at all Not difficult at all       No data to display          Medications: Outpatient Medications Prior to Visit  Medication Sig   acetaminophen (TYLENOL) 500 MG tablet Take 1,000 mg by mouth 3 (three) times daily. Take 1000 mg by mouth in the morning. Take 500 mg by mouth in the afternoon and at bedtime.   albuterol (VENTOLIN HFA) 108 (90 Base) MCG/ACT inhaler Inhale 1-2 puffs into the lungs every 6 (six) hours as needed for wheezing or shortness of breath. (Patient taking differently: Inhale 2 puffs into the lungs as needed for wheezing or shortness of breath.)   atorvastatin (LIPITOR) 10 MG tablet Take 1 tablet (10 mg total) by mouth daily.   furosemide (LASIX) 40 MG tablet TAKE 1 TABLET(40 MG) BY MOUTH DAILY   gabapentin (NEURONTIN) 300 MG capsule TAKE 1 CAPSULE(300 MG) BY MOUTH TWICE DAILY   glucose blood (RIGHTEST GS550 BLOOD GLUCOSE) test strip USE AS DIRECTED TO CHECK BLOOD SUGAR UP TO 4 TIMES DAILY.   ketotifen (ZADITOR) 0.025 % ophthalmic solution Place 1 drop into both eyes daily as needed  (allergies).   loratadine (CLARITIN) 10 MG tablet Take 10 mg by mouth daily as needed for allergies.   metFORMIN (GLUCOPHAGE) 1000 MG tablet Take 1 tablet (1,000 mg total) by mouth 2 (two) times daily with a meal.   potassium chloride (KLOR-CON) 10 MEQ tablet TAKE 1 TABLET(10 MEQ) BY MOUTH DAILY   No facility-administered medications prior to visit.    Review of Systems Except see HPI   {Insert previous labs (optional):23779} {See past labs  Heme  Chem  Endocrine  Serology  Results Review (optional):1}   Objective    There were no vitals taken for this visit. {Insert last BP/Wt (optional):23777}{See vitals history (optional):1}   Physical Exam   No results found for any visits on 07/20/23.  Assessment & Plan    *** Assessment and Plan              No follow-ups on file.      [facility]

## 2023-07-20 ENCOUNTER — Encounter: Payer: Self-pay | Admitting: Physician Assistant

## 2023-07-20 ENCOUNTER — Ambulatory Visit (INDEPENDENT_AMBULATORY_CARE_PROVIDER_SITE_OTHER): Payer: Medicaid Other | Admitting: Physician Assistant

## 2023-07-20 VITALS — BP 126/79 | HR 90 | Temp 97.9°F | Resp 16 | Ht 70.0 in | Wt 247.5 lb

## 2023-07-20 DIAGNOSIS — E66811 Obesity, class 1: Secondary | ICD-10-CM

## 2023-07-20 DIAGNOSIS — E119 Type 2 diabetes mellitus without complications: Secondary | ICD-10-CM

## 2023-07-20 DIAGNOSIS — Z8679 Personal history of other diseases of the circulatory system: Secondary | ICD-10-CM | POA: Diagnosis not present

## 2023-07-20 DIAGNOSIS — Z794 Long term (current) use of insulin: Secondary | ICD-10-CM

## 2023-07-20 DIAGNOSIS — R600 Localized edema: Secondary | ICD-10-CM | POA: Diagnosis not present

## 2023-07-20 DIAGNOSIS — G629 Polyneuropathy, unspecified: Secondary | ICD-10-CM | POA: Diagnosis not present

## 2023-07-20 DIAGNOSIS — Z789 Other specified health status: Secondary | ICD-10-CM

## 2023-07-20 DIAGNOSIS — E7849 Other hyperlipidemia: Secondary | ICD-10-CM

## 2023-07-21 ENCOUNTER — Other Ambulatory Visit: Payer: Self-pay

## 2023-07-21 DIAGNOSIS — D696 Thrombocytopenia, unspecified: Secondary | ICD-10-CM

## 2023-07-21 LAB — CBC WITH DIFFERENTIAL/PLATELET
Basophils Absolute: 0.1 10*3/uL (ref 0.0–0.2)
Basos: 2 %
EOS (ABSOLUTE): 0.5 10*3/uL — ABNORMAL HIGH (ref 0.0–0.4)
Eos: 7 %
Hematocrit: 43.9 % (ref 37.5–51.0)
Hemoglobin: 14.7 g/dL (ref 13.0–17.7)
Immature Grans (Abs): 0 10*3/uL (ref 0.0–0.1)
Immature Granulocytes: 0 %
Lymphocytes Absolute: 2 10*3/uL (ref 0.7–3.1)
Lymphs: 25 %
MCH: 33 pg (ref 26.6–33.0)
MCHC: 33.5 g/dL (ref 31.5–35.7)
MCV: 99 fL — ABNORMAL HIGH (ref 79–97)
Monocytes Absolute: 0.8 10*3/uL (ref 0.1–0.9)
Monocytes: 10 %
Neutrophils Absolute: 4.5 10*3/uL (ref 1.4–7.0)
Neutrophils: 56 %
Platelets: 137 10*3/uL — ABNORMAL LOW (ref 150–450)
RBC: 4.45 x10E6/uL (ref 4.14–5.80)
RDW: 13.2 % (ref 11.6–15.4)
WBC: 7.9 10*3/uL (ref 3.4–10.8)

## 2023-07-21 LAB — HEMOGLOBIN A1C
Est. average glucose Bld gHb Est-mCnc: 140 mg/dL
Hgb A1c MFr Bld: 6.5 % — ABNORMAL HIGH (ref 4.8–5.6)

## 2023-07-21 NOTE — Progress Notes (Signed)
Please, place a referral to hematology for thrombocytopenia if pt agrees

## 2023-07-31 DIAGNOSIS — M1990 Unspecified osteoarthritis, unspecified site: Secondary | ICD-10-CM | POA: Diagnosis not present

## 2023-07-31 DIAGNOSIS — Z8619 Personal history of other infectious and parasitic diseases: Secondary | ICD-10-CM | POA: Diagnosis not present

## 2023-07-31 DIAGNOSIS — N189 Chronic kidney disease, unspecified: Secondary | ICD-10-CM | POA: Diagnosis not present

## 2023-07-31 DIAGNOSIS — I081 Rheumatic disorders of both mitral and tricuspid valves: Secondary | ICD-10-CM | POA: Diagnosis not present

## 2023-07-31 DIAGNOSIS — R011 Cardiac murmur, unspecified: Secondary | ICD-10-CM | POA: Diagnosis not present

## 2023-07-31 DIAGNOSIS — Z6835 Body mass index (BMI) 35.0-35.9, adult: Secondary | ICD-10-CM | POA: Diagnosis not present

## 2023-07-31 DIAGNOSIS — E1122 Type 2 diabetes mellitus with diabetic chronic kidney disease: Secondary | ICD-10-CM | POA: Diagnosis not present

## 2023-07-31 DIAGNOSIS — I083 Combined rheumatic disorders of mitral, aortic and tricuspid valves: Secondary | ICD-10-CM | POA: Diagnosis not present

## 2023-07-31 DIAGNOSIS — R918 Other nonspecific abnormal finding of lung field: Secondary | ICD-10-CM | POA: Diagnosis not present

## 2023-07-31 DIAGNOSIS — K089 Disorder of teeth and supporting structures, unspecified: Secondary | ICD-10-CM | POA: Diagnosis not present

## 2023-07-31 DIAGNOSIS — J45909 Unspecified asthma, uncomplicated: Secondary | ICD-10-CM | POA: Diagnosis not present

## 2023-07-31 DIAGNOSIS — I071 Rheumatic tricuspid insufficiency: Secondary | ICD-10-CM | POA: Diagnosis not present

## 2023-07-31 DIAGNOSIS — E669 Obesity, unspecified: Secondary | ICD-10-CM | POA: Diagnosis not present

## 2023-07-31 DIAGNOSIS — I2510 Atherosclerotic heart disease of native coronary artery without angina pectoris: Secondary | ICD-10-CM | POA: Diagnosis not present

## 2023-08-02 DIAGNOSIS — E1142 Type 2 diabetes mellitus with diabetic polyneuropathy: Secondary | ICD-10-CM | POA: Diagnosis not present

## 2023-08-02 DIAGNOSIS — I071 Rheumatic tricuspid insufficiency: Secondary | ICD-10-CM | POA: Diagnosis not present

## 2023-08-02 DIAGNOSIS — I361 Nonrheumatic tricuspid (valve) insufficiency: Secondary | ICD-10-CM | POA: Diagnosis not present

## 2023-08-02 DIAGNOSIS — Z01810 Encounter for preprocedural cardiovascular examination: Secondary | ICD-10-CM | POA: Diagnosis not present

## 2023-08-02 DIAGNOSIS — Z7984 Long term (current) use of oral hypoglycemic drugs: Secondary | ICD-10-CM | POA: Diagnosis not present

## 2023-08-02 DIAGNOSIS — E1122 Type 2 diabetes mellitus with diabetic chronic kidney disease: Secondary | ICD-10-CM | POA: Diagnosis not present

## 2023-08-02 DIAGNOSIS — N189 Chronic kidney disease, unspecified: Secondary | ICD-10-CM | POA: Diagnosis not present

## 2023-08-03 DIAGNOSIS — R931 Abnormal findings on diagnostic imaging of heart and coronary circulation: Secondary | ICD-10-CM | POA: Diagnosis not present

## 2023-08-13 DIAGNOSIS — Z419 Encounter for procedure for purposes other than remedying health state, unspecified: Secondary | ICD-10-CM | POA: Diagnosis not present

## 2023-08-22 LAB — HM DIABETES EYE EXAM

## 2023-08-29 DIAGNOSIS — Z87891 Personal history of nicotine dependence: Secondary | ICD-10-CM | POA: Diagnosis not present

## 2023-08-29 DIAGNOSIS — Z01818 Encounter for other preprocedural examination: Secondary | ICD-10-CM | POA: Diagnosis not present

## 2023-08-29 DIAGNOSIS — E785 Hyperlipidemia, unspecified: Secondary | ICD-10-CM | POA: Diagnosis not present

## 2023-08-29 DIAGNOSIS — I6523 Occlusion and stenosis of bilateral carotid arteries: Secondary | ICD-10-CM | POA: Diagnosis not present

## 2023-08-29 DIAGNOSIS — I071 Rheumatic tricuspid insufficiency: Secondary | ICD-10-CM | POA: Diagnosis not present

## 2023-08-29 DIAGNOSIS — E119 Type 2 diabetes mellitus without complications: Secondary | ICD-10-CM | POA: Diagnosis not present

## 2023-08-30 DIAGNOSIS — R768 Other specified abnormal immunological findings in serum: Secondary | ICD-10-CM | POA: Diagnosis not present

## 2023-09-07 ENCOUNTER — Other Ambulatory Visit: Payer: Self-pay | Admitting: Physician Assistant

## 2023-09-07 DIAGNOSIS — G629 Polyneuropathy, unspecified: Secondary | ICD-10-CM

## 2023-09-11 NOTE — Telephone Encounter (Signed)
Requested Prescriptions  Pending Prescriptions Disp Refills   gabapentin (NEURONTIN) 300 MG capsule [Pharmacy Med Name: GABAPENTIN 300MG  CAPSULES] 180 capsule 1    Sig: TAKE 1 CAPSULE(300 MG) BY MOUTH TWICE DAILY     Neurology: Anticonvulsants - gabapentin Passed - 09/11/2023  7:59 AM      Passed - Cr in normal range and within 360 days    Creatinine, Ser  Date Value Ref Range Status  06/29/2023 0.88 0.76 - 1.27 mg/dL Final         Passed - Completed PHQ-2 or PHQ-9 in the last 360 days      Passed - Valid encounter within last 12 months    Recent Outpatient Visits           1 month ago Type 2 diabetes mellitus without complication, with long-term current use of insulin (HCC)   [place] [facility] [place], [place], PA-C   4 months ago Type 2 diabetes mellitus without complication, with long-term current use of insulin (HCC)   [place] [facility] [place], [place], PA-C   8 months ago Obesity (BMI 30.0-34.9)   [place] [facility] [place], [place], [place]   9 months ago Hx of bacterial endocarditis   [facility] [place], [place], [place]       Future Appointments             In 3 weeks Dunn, Raymon Mutton, PA-C [facility] [place]   In 1 month Debera Lat, PA-C [facility]

## 2023-09-13 DIAGNOSIS — Z419 Encounter for procedure for purposes other than remedying health state, unspecified: Secondary | ICD-10-CM | POA: Diagnosis not present

## 2023-09-28 DIAGNOSIS — I2510 Atherosclerotic heart disease of native coronary artery without angina pectoris: Secondary | ICD-10-CM | POA: Diagnosis not present

## 2023-10-01 DIAGNOSIS — I071 Rheumatic tricuspid insufficiency: Secondary | ICD-10-CM | POA: Diagnosis not present

## 2023-10-03 ENCOUNTER — Encounter: Payer: Self-pay | Admitting: Physician Assistant

## 2023-10-03 ENCOUNTER — Ambulatory Visit: Payer: Medicaid Other | Attending: Physician Assistant | Admitting: Physician Assistant

## 2023-10-03 VITALS — BP 120/82 | HR 91 | Ht 70.0 in | Wt 250.8 lb

## 2023-10-03 DIAGNOSIS — I6529 Occlusion and stenosis of unspecified carotid artery: Secondary | ICD-10-CM

## 2023-10-03 DIAGNOSIS — I442 Atrioventricular block, complete: Secondary | ICD-10-CM

## 2023-10-03 DIAGNOSIS — I2510 Atherosclerotic heart disease of native coronary artery without angina pectoris: Secondary | ICD-10-CM

## 2023-10-03 DIAGNOSIS — I071 Rheumatic tricuspid insufficiency: Secondary | ICD-10-CM | POA: Diagnosis not present

## 2023-10-03 DIAGNOSIS — I519 Heart disease, unspecified: Secondary | ICD-10-CM

## 2023-10-03 NOTE — Patient Instructions (Signed)
Medication Instructions:  Your Physician recommend you continue on your current medication as directed.    *If you need a refill on your cardiac medications before your next appointment, please call your pharmacy*   Lab Work: None ordered at this time  If you have labs (blood work) drawn today and your tests are completely normal, you will receive your results only by: MyChart Message (if you have MyChart) OR A paper copy in the mail If you have any lab test that is abnormal or we need to change your treatment, we will call you to review the results.   Follow-Up: At [facility], you and your health needs are our priority.  As part of our continuing mission to provide you with exceptional heart care, we have created designated Provider Care Teams.  These Care Teams include your primary Cardiologist (physician) and Advanced Practice Providers (APPs -  Physician Assistants and Nurse Practitioners) who all work together to provide you with the care you need, when you need it.  We recommend signing up for the patient portal called "MyChart".  Sign up information is provided on this After Visit Summary.  MyChart is used to connect with patients for Virtual Visits (Telemedicine).  Patients are able to view lab/test results, encounter notes, upcoming appointments, etc.  Non-urgent messages can be sent to your provider as well.   To learn more about what you can do with MyChart, go to [email or web address].    Your next appointment:   3 month(s)  Provider:   You may see Lorine Bears, MD or one of the following Advanced Practice Providers on your designated Care Team:   Eula Listen, [place]

## 2023-10-03 NOTE — Progress Notes (Signed)
Cardiology Office Note    Date:  10/03/2023   ID:  Joshua Cole, DOB 1961-03-17, MRN [id number]  PCP:  Debera Lat, PA-C  Cardiologist:  Lorine Bears, MD  Electrophysiologist:  Lanier Prude, MD   Chief Complaint: Follow up  History of Present Illness:   Joshua Cole is a 63 y.o. 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    He was admitted in 06/2021 with weakness and hip pain.  He was found to have MSSA bacteremia.  CTA of the chest was negative for PE.  In the setting of bacteremia, TEE showed a tricuspid valve vegetation.  He developed complete heart block with junctional escape and did not require temporary transvenous pacing.  He was transferred to [facility] and seen by CT surgery with recommendation for ongoing antibiotic therapy and outpatient follow-up.  Hospital course was complicated by anemia requiring 2 units of packed red blood cells.  He followed up with ID in the outpatient setting.  Following completion of antibiotics, repeat blood cultures were drawn and negative.  In follow-up with EP in 08/2021, he was in an accelerated junctional rhythm with recommendation for conservative therapy.  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  He was reevaluated by CT surgery in 10/2021, and based on the location of the mass, it was not felt to be a good candidate for angio VAC debridement.  There was no strong indication for open  debridement and it was recommended to continue conservative management.  He was seen in the office in 11/2021 noting chronic dyspnea, and for preoperative cardiac risk stratification.  To further risk stratify, he underwent Lexiscan MPI in 11/2021 that showed no evidence of ischemia or infarction and was overall low risk.  CT attenuation corrected images showed minimal coronary calcification and moderate aortic atherosclerosis.  He underwent shoulder arthroscopy in 02/2022 without cardiac complication.  He was seen by EP in 02/2022 with EKG showing sinus rhythm and narrow QRS with a prolonged PR interval.  Continued monitoring and conservative therapy was recommended with no indication for permanent pacing.  He has since undergone excisional left total hip arthroplasty with antibiotic spacer placement in 05/2022 followed by removal of antibiotic spacer and left total hip revision arthroplasty in 09/2022.  Deep wound cultures showed no growth.   He was seen in the office on 03/07/2023, reporting an increase in lower extremity swelling and was taking furosemide 20 mg daily.  He was without chest pain, dyspnea, orthopnea, or abdominal distention.  His weight was up 9 pounds when compared to his clinic visit in 02/2022.  It was recommended he increase furosemide to 40 mg daily.  Due to a slightly uptrending BUN/serum creatinine, furosemide was subsequently reduced back to 20 mg daily.  Echo on 03/21/2023 showed an EF of 60-65%, no regional wall motion abnormalities, low normal RV systolic function mildly enlarged RV cavity size, moderate biatrial enlargement, mild to moderate mitral regurgitation, moderate to severe tricuspid regurgitation, aortic valve sclerosis without evidence of stenosis, and an estimated right atrial  pressure of 15 mmHg.  Echo results were reviewed with his primary cardiologist with recommendation to increase furosemide back to 40 mg daily and pursue a repeat limited echo in 3 to 4 months to reevaluate  tricuspid regurgitation.  He was seen in the office in 04/2023 and reported no significant dyspnea, or evidence of volume overload.  He was continued on furosemide 40 mg daily with follow-up labs showing stable renal function.  Limited echo on 06/27/2023 showed an EF of 60 to 65%, no regional wall motion abnormalities, normal RV systolic function with moderately enlarged ventricular cavity size, mild mitral regurgitation, poorly visualized tricuspid valve with moderate to severe regurgitation (though difficult to estimate), and an estimated right atrial pressure of 8 mmHg.  He was last seen in the office in 06/2023 and reported unchanged dyspnea from his visit in 04/2023 that was improved from visit in 02/2023.  He continued to note vigorous urine output with furosemide.  Weight was stable.    In the setting of his significant tricuspid regurgitation, he was referred to [facility].  Surface echo in 07/2023 showed an EF greater than 55%, torrential tricuspid regurgitation, severely dilated right atrium, mildly dilated left atrium, severely dilated right ventricle with mildly reduced RV systolic function, and mildly thickened aortic valve leaflets with normal excursion.  TEE at that time showed normal LV systolic function, mitral regurgitation, mildly dilated left atrium, thickened aortic valve leaflets with normal excursion, severely dilated RV with moderately reduced systolic function, poorly visualized tricuspid valve leaflets with the anterior and posterior leaflets appearing diminutive with torrential tricuspid regurgitation, and severely dilated right atrium.  Coronary CTA in 07/2023 showed severely dilated RV with mildly reduced RV systolic function with calcified plaque noted in the proximal and mid LAD lies in the distal RCA at the takeoff of the PDA.  CT FFR with hemodynamically significant stenosis in OM1 with a value of 0.61 and RCA with a value of 0.62 (study performed without nitroglycerin).  Carotid artery  ultrasound in 08/2023 showed 50 to 69% right ICA stenosis significant stenosis in the right CCA, and less than 50% left ICA stenosis.  LHC on 09/28/2023 showed large and normal left main, large LAD with no CAD, 30% proximal LCx stenosis mild OM1 disease, and a large dominant RCA with large RPDA with ostial 40% stenosis.  He comes in today and is doing well from a cardiac perspective with stable chronic dyspnea.  No frank angina.  No dizziness, presyncope, or syncope.  Awaiting surgical date for tricuspid valve replacement.  Weight stable by our scale on furosemide 40 mg daily.   Labs independently reviewed: 09/2023 - Hgb 14.8, PLT 110, potassium 4.3, BUN 12, serum creatinine 0.78 08/2023 - A1c 6.4, TSH normal 06/2023 - TC 133, TG 93, HDL 55, LDL 60, albumin 4.8, AST/ALT normal  Past Medical History:  Diagnosis Date   Arthritis    Asthma    Chronic kidney disease    Diabetes mellitus without complication (HCC)    type 2   DJD (degenerative joint disease)    Dysrhythmia    junctional tachycardia and incomplete heart block   Edema of left lower extremity 07/04/2022   Gout    Heart block    Lung nodules    a. 09/2021 CT chest: Interval decrease in size and number of bilateral lung nodules, likely consistent with sequelae associated with an infectious/inflammatory process.   MSSA bacteremia 06/2021   Rotator cuff tear 08/26/2021   Subacute bacterial endocarditis (SBE)  a. 06/2021 TEE: mobile mass attached to the tricuspid valve-->Abx rx-->09/2021 TEE: EF 55-60%, no rwma, nl RV fxn, mild-mod RAE, mild MR, mobile echodense 11x104mm mass in the TV apparatus-->conservative rx per TCTS.    Past Surgical History:  Procedure Laterality Date   ACHILLES TENDON REPAIR Left    many years ago per pt   ANTERIOR HIP REVISION Left 10/06/2022   Procedure: REMOVAL OF LEFT HIP ANTIBIOTIC SPACER, LEFT TOTAL HIP REVISION ARTHROPLASTY;  Surgeon: Kathryne Hitch, MD;  Location: [facility] ORS;  Service:  Orthopedics;  Laterality: Left;   BIOPSY  07/16/2021   Procedure: BIOPSY;  Surgeon: Imogene Burn, MD;  Location: [facility] ENDOSCOPY;  Service: Gastroenterology;;  EGD and COLON   COLONOSCOPY N/A 07/16/2021   Procedure: COLONOSCOPY;  Surgeon: Imogene Burn, MD;  Location: [facility] ENDOSCOPY;  Service: Gastroenterology;  Laterality: N/A;   COLONOSCOPY WITH PROPOFOL N/A 07/16/2021   Procedure: COLONOSCOPY WITH PROPOFOL;  Surgeon: Imogene Burn, MD;  Location: [facility] ENDOSCOPY;  Service: Gastroenterology;  Laterality: N/A;   ESOPHAGOGASTRODUODENOSCOPY (EGD) WITH PROPOFOL N/A 07/16/2021   Procedure: ESOPHAGOGASTRODUODENOSCOPY (EGD) WITH PROPOFOL;  Surgeon: Imogene Burn, MD;  Location: [facility] ENDOSCOPY;  Service: Gastroenterology;  Laterality: N/A;   EXCISIONAL TOTAL HIP ARTHROPLASTY WITH ANTIBIOTIC SPACERS Left 05/20/2022   Procedure: EXCISIONAL LEFT TOTAL HIP ARTHROPLASTY WITH ANTIBIOTIC SPACERS;  Surgeon: Kathryne Hitch, MD;  Location: [facility] ORS;  Service: Orthopedics;  Laterality: Left;   FOOT SURGERY Right    ligaments repaired- many years ago per pt   HERNIA REPAIR     JOINT REPLACEMENT Bilateral    hip   POLYPECTOMY  07/16/2021   Procedure: POLYPECTOMY;  Surgeon: Imogene Burn, MD;  Location: [facility] ENDOSCOPY;  Service: Gastroenterology;;   SHOULDER ARTHROSCOPY WITH ROTATOR CUFF REPAIR AND SUBACROMIAL DECOMPRESSION Left 02/10/2022   Procedure: LEFT SHOULDER ARTHROSCOPY WITH EXTENSIVE DEBRIDEMENT, SUBACROMIAL DECOMPRESSION;  Surgeon: Kathryne Hitch, MD;  Location: [facility] OR;  Service: Orthopedics;  Laterality: Left;   TEE WITHOUT CARDIOVERSION N/A 07/07/2021   Procedure: TRANSESOPHAGEAL ECHOCARDIOGRAM (TEE);  Surgeon: Debbe Odea, MD;  Location: [facility] ORS;  Service: Cardiovascular;  Laterality: N/A;    Current Medications: Current Meds  Medication Sig   acetaminophen (TYLENOL) 500 MG tablet Take 1,000 mg by mouth 3 (three) times daily. Take 1000 mg by mouth in the morning. Take 500 mg by  mouth in the afternoon and at bedtime.   albuterol (VENTOLIN HFA) 108 (90 Base) MCG/ACT inhaler Inhale 1-2 puffs into the lungs every 6 (six) hours as needed for wheezing or shortness of breath.   atorvastatin (LIPITOR) 10 MG tablet Take 1 tablet (10 mg total) by mouth daily.   furosemide (LASIX) 40 MG tablet TAKE 1 TABLET(40 MG) BY MOUTH DAILY   gabapentin (NEURONTIN) 300 MG capsule TAKE 1 CAPSULE(300 MG) BY MOUTH TWICE DAILY   glucose blood (RIGHTEST GS550 BLOOD GLUCOSE) test strip USE AS DIRECTED TO CHECK BLOOD SUGAR UP TO 4 TIMES DAILY.   ketotifen (ZADITOR) 0.025 % ophthalmic solution Place 1 drop into both eyes daily as needed (allergies).   loratadine (CLARITIN) 10 MG tablet Take 10 mg by mouth daily as needed for allergies.   metFORMIN (GLUCOPHAGE) 1000 MG tablet Take 1 tablet (1,000 mg total) by mouth 2 (two) times daily with a meal.   potassium chloride (KLOR-CON) 10 MEQ tablet TAKE 1 TABLET(10 MEQ) BY MOUTH DAILY    Allergies:   Patient has no known allergies.   Social History   Socioeconomic History   Marital  status: Widowed    Spouse name: Not on file   Number of children: 1   Years of education: Not on file   Highest education level: Not on file  Occupational History   Not on file  Tobacco Use   Smoking status: Former    Current packs/day: 0.00    Average packs/day: 0.3 packs/day for 15.0 years (3.8 ttl pk-yrs)    Types: Cigarettes    Start date: 51    Quit date: 2007    Years since quitting: 18.0   Smokeless tobacco: Never  Vaping Use   Vaping status: Never Used  Substance and Sexual Activity   Alcohol use: Not Currently    Alcohol/week: 20.0 standard drinks of alcohol    Types: 20 Cans of beer per week    Comment: couple of glasses of wine on the weekends   Drug use: Not Currently   Sexual activity: Not on file  Other Topics Concern   Not on file  Social History Narrative   Not on file   Social Drivers of Health   Financial Resource Strain: Not on  file  Food Insecurity: No Food Insecurity (10/06/2022)   Hunger Vital Sign    Worried About Running Out of Food in the Last Year: Never true    Ran Out of Food in the Last Year: Never true  Transportation Needs: No Transportation Needs (10/06/2022)   PRAPARE - Administrator, Civil Service (Medical): No    Lack of Transportation (Non-Medical): No  Physical Activity: Not on file  Stress: Not on file  Social Connections: Not on file     Family History:  The patient's family history includes Breast cancer in his sister; Diabetes in his brother; Heart attack (age of onset: 64) in his brother; Hyperlipidemia in his brother; Hypertension in his brother; Lung cancer in his father; Other in his mother; Psoriasis in his sister.  ROS:   12-point review of systems is negative unless otherwise noted in the HPI.   EKGs/Labs/Other Studies Reviewed:    Studies reviewed were summarized above. The additional studies were reviewed today:  Carotid artery ultrasound 08/29/2023 [facility]): Summary of Findings   1. Right CCA demonstrates significant plaque.   2. Right ICA stenosis 50 - 69%.   3. The Left ICA stenosis less than 50%.  __________  Cardiac CT TAVR 08/02/2023 [facility]): Impressions:  - Severely dilated RV with mildly reduced systolic function  - Calcific, likely non-obstructive, coronary disease as described below   Coronary Anatomy:  Right dominant with normal coronary ostia.  Calcified plaques noted in the proximal and mid LAD, likely non  obstructive  Calcified plaque in the distal RCA at take off of PDA, likely non  obstructive   Valves:  - TV leaflets are not well seen.  - Normal mitral and aortic valves (minimal calcification on aortic valve)    CT FFR:  DETAILED FINDINGS:  1. Left Main - normal FFRct extending to the distal segment with a value  of 0.99  2. [LAD] -  Overall normal, the mid to distal LAD was not evaluated  3. [LCX] -  there is a hemodynamically  significant stenosis in the OM1  vessel with a FFRct value of 0.61  4. [RCA] -  there is a hemodynamically significant stenosis in the RCA  vessel with a FFRct value of 0.62   CONCLUSION:  Cannot rule out obstructive CAD. Note that this study was performed  without nitroglycerine   2D  echo 07/31/2023 [facility]): Summary   1. The left ventricle is normal in size with normal wall thickness.    2. The left ventricular systolic function is normal, LVEF is visually  estimated at > 55%.    3. The aortic valve is trileaflet with mildly thickened leaflets with normal  excursion.   4. The left atrium is mildly dilated in size.    5. The right ventricle is severely dilated in size, with mildly reduced  systolic function.    6. There is torrential tricuspid regurgitation.    7. The right atrium is severely dilated in size.    8. Technically difficult study.  __________  TEE 07/31/2023 [facility]): Summary   1. The left ventricular systolic function is normal.    2. The mitral valve leaflets are mildly thickened with normal leaflet  mobility.   3. There is mild mitral valve regurgitation.    4. The aortic valve is trileaflet with mildly thickened leaflets with normal  excursion.   5. The left atrium is mildly dilated in size.    6. The right ventricle is severely dilated in size, with moderately reduced  systolic function.    7. The tricuspid valve leaflets are poorly visualized; the anterior and  posterior leaflets appear diminutive.    8. There is torrential tricuspid regurgitation.    9. The right atrium is severely dilated in size.  __________  Limited echo 06/27/2023: 1. Left ventricular ejection fraction, by estimation, is 60 to 65%. Left  ventricular ejection fraction by PLAX is 56 %. The left ventricle has  normal function. The left ventricle has no regional wall motion  abnormalities.   2. Right ventricular systolic function is normal. The right ventricular  size is moderately  enlarged. Tricuspid regurgitation signal is inadequate  for assessing PA pressure.   3. The mitral valve is normal in structure. Mild mitral valve  regurgitation. No evidence of mitral stenosis.   4. The tricuspid valve is not well visualized. Tricuspid valve  regurgitation is moderate to severe, though difficult to estimate. No  evidence of tricuspid stenosis   5. The aortic valve has an indeterminant number of cusps. Aortic valve  regurgitation is not visualized. No aortic stenosis is present.   6. The inferior vena cava is dilated in size with >50% respiratory  variability, suggesting right atrial pressure of 8 mmHg.  __________   2D echo 03/21/2023: 1. Left ventricular ejection fraction, by estimation, is 60 to 65%. The  left ventricle has normal function. The left ventricle has no regional  wall motion abnormalities. Left ventricular diastolic parameters are  indeterminate. The average left  ventricular global longitudinal strain is -17.6 %.   2. Right ventricular systolic function is low normal. The right  ventricular size is mildly enlarged. Tricuspid regurgitation signal is  inadequate for assessing PA pressure.   3. Left atrial size was moderately dilated.   4. Right atrial size was moderately dilated.   5. The mitral valve is normal in structure. Mild to moderate mitral valve  regurgitation. No evidence of mitral stenosis.   6. Tricuspid valve is not well visualized. Unable to exclude underlying  valve pathology. Regurgitation is moderate to severe. Consider TEE if  clinically indicated.   7. The aortic valve is tricuspid. Aortic valve regurgitation is not  visualized. Aortic valve sclerosis is present, with no evidence of aortic  valve stenosis.   8. The inferior vena cava is dilated in size with <50% respiratory  variability, suggesting right  atrial pressure of 15 mmHg.  __________   Eugenie Birks MPI 12/06/2021:   The study is normal. The study is low risk.   No ST  deviation was noted.   LV perfusion is normal. There is no evidence of ischemia. There is no evidence of infarction.   Left ventricular function is normal. End diastolic cavity size is normal. End systolic cavity size is normal.   CT attenuation images show evidence of moderate aortic calcifications and minimal coronary calcifications. __________   2D echo 10/07/2021: 1. Left ventricular ejection fraction, by estimation, is 55 to 60%. The  left ventricle has normal function. The left ventricle has no regional  wall motion abnormalities. Left ventricular diastolic parameters are  indeterminate.   2. Right ventricular systolic function is normal. The right ventricular  size is not well visualized.   3. Right atrial size was mild to moderately dilated.   4. The mitral valve is grossly normal. Mild mitral valve regurgitation.   5. There is a mobile echodense (11 x 7 mm) mass in the tricuspid valve  apparatus (clip 44, 51). Given history of tricuspid mass, clinical  correlation advised.   6. The aortic valve was not well visualized. Aortic valve regurgitation  is not visualized.   7. The inferior vena cava is dilated in size with <50% respiratory  variability, suggesting right atrial pressure of 15 mmHg.  __________   TEE 07/07/2021: 1. Left ventricular ejection fraction, by estimation, is 55 to 60%. The  left ventricle has normal function.   2. Right ventricular systolic function is normal. The right ventricular  size is normal.   3. No left atrial/left atrial appendage thrombus was detected.   4. The mitral valve is normal in structure. Mild mitral valve  regurgitation.   5. There is a mobile mass attached to the tricuspid valve (clip 132)  consistent with a vegetation given the current clinical context.. The  tricuspid valve is degenerative.   6. The aortic valve is tricuspid. Aortic valve regurgitation is not  visualized.  __________   2D echo 07/04/2021: 1. Left ventricular  ejection fraction, by estimation, is 55 to 60%. The  left ventricle has normal function. Left ventricular endocardial border  not optimally defined to evaluate regional wall motion. There is mild left  ventricular hypertrophy. Left  ventricular diastolic parameters are indeterminate.   2. Right ventricular systolic function is normal. The right ventricular  size is normal. Tricuspid regurgitation signal is inadequate for assessing  PA pressure.   3. Left atrial size was mildly dilated.   4. Right atrial size was mildly dilated.   5. The mitral valve is normal in structure. No evidence of mitral valve  regurgitation. No evidence of mitral stenosis.   6. The aortic valve is normal in structure. Aortic valve regurgitation is  not visualized. Mild aortic valve sclerosis is present, with no evidence  of aortic valve stenosis.   7. Aortic dilatation noted. There is borderline dilatation of the aortic  root and of the ascending aorta, measuring 38 mm.   8. The tricuspid was not well visualized. However, there is likely a  mobile vergetation noted on the short axis images. Recommend a TEE.   EKG:  EKG is not ordered today.    Recent Labs: 01/04/2023: TSH 2.180 06/29/2023: ALT 13; BUN 16; Creatinine, Ser 0.88; Potassium 3.9; Sodium 140 07/20/2023: Hemoglobin 14.7; Platelets 137  Recent Lipid Panel    Component Value Date/Time   CHOL 133 06/29/2023 1007  TRIG 93 06/29/2023 1007   HDL 55 06/29/2023 1007   CHOLHDL 2.4 06/29/2023 1007   CHOLHDL NOT CALCULATED 07/08/2021 0628   VLDL 20 07/08/2021 0628   LDLCALC 60 06/29/2023 1007   LDLDIRECT 65 06/29/2023 1007    PHYSICAL EXAM:    VS:  BP 120/82 (BP Location: Left Arm, Patient Position: Sitting, Cuff Size: Normal)   Pulse 91   Ht 5\' 10"  (1.778 m)   Wt 250 lb 12.8 oz (113.8 kg)   SpO2 97%   BMI 35.99 kg/m   BMI: Body mass index is 35.99 kg/m.  Physical Exam Vitals reviewed.  Constitutional:      Appearance: He is well-developed.   HENT:     Head: Normocephalic and atraumatic.  Eyes:     General:        Right eye: No discharge.        Left eye: No discharge.  Neck:     Vascular: No JVD.  Cardiovascular:     Rate and Rhythm: Normal rate and regular rhythm.     Heart sounds: Normal heart sounds, S1 normal and S2 normal. Heart sounds not distant. No midsystolic click and no opening snap. No murmur heard.    No friction rub.  Pulmonary:     Effort: Pulmonary effort is normal. No respiratory distress.     Breath sounds: Normal breath sounds. No decreased breath sounds, wheezing, rhonchi or rales.  Chest:     Chest wall: No tenderness.  Abdominal:     General: There is no distension.  Musculoskeletal:     Cervical back: Normal range of motion.  Skin:    General: Skin is warm and dry.     Nails: There is no clubbing.  Neurological:     Mental Status: He is alert and oriented to person, place, and time.  Psychiatric:        Speech: Speech normal.        Behavior: Behavior normal.        Thought Content: Thought content normal.        Judgment: Judgment normal.     Wt Readings from Last 3 Encounters:  10/03/23 250 lb 12.8 oz (113.8 kg)  07/20/23 247 lb 8 oz (112.3 kg)  06/29/23 249 lb 3.2 oz (113 kg)     ASSESSMENT & PLAN:   Nonobstructive CAD involving the native coronary arteries without angina: Recent LHC [facility] earlier this month showed nonobstructive CAD as outlined below.  Recommend aggressive risk factor modification and primary prevention.  If not contraindicated from [facility] perspective, could start aspirin 81 mg with continuation of atorvastatin.  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  SBE prophylaxis is indicated.  Remains on furosemide with stable labs.  History of complete heart block: Resolved and asymptomatic.  Evaluated by EP with no indication for pacemaker implantation.  Avoiding AV  nodal blocking medications.  Carotid artery disease: Medical therapy as outlined above.    Disposition: F/u with Dr. Kirke Corin or an APP in 3 months, sooner if needed.   Medication Adjustments/Labs and Tests Ordered: Current medicines are reviewed at length with the patient today.  Concerns regarding medicines are outlined above. Medication changes, Labs and Tests ordered today are summarized above and listed in the Patient Instructions accessible in Encounters.   Signed, Eula Listen, PA-C 10/03/2023 2:39 PM     [facility] [place] [address] [facility] [address] [place], [place] [address] [phone]

## 2023-10-14 DIAGNOSIS — Z419 Encounter for procedure for purposes other than remedying health state, unspecified: Secondary | ICD-10-CM | POA: Diagnosis not present

## 2023-10-20 ENCOUNTER — Ambulatory Visit: Payer: Self-pay | Admitting: Physician Assistant

## 2023-10-20 DIAGNOSIS — D696 Thrombocytopenia, unspecified: Secondary | ICD-10-CM | POA: Diagnosis not present

## 2023-10-20 DIAGNOSIS — N179 Acute kidney failure, unspecified: Secondary | ICD-10-CM | POA: Diagnosis not present

## 2023-10-20 DIAGNOSIS — Z4682 Encounter for fitting and adjustment of non-vascular catheter: Secondary | ICD-10-CM | POA: Diagnosis not present

## 2023-10-20 DIAGNOSIS — I081 Rheumatic disorders of both mitral and tricuspid valves: Secondary | ICD-10-CM | POA: Diagnosis not present

## 2023-10-20 DIAGNOSIS — I4719 Other supraventricular tachycardia: Secondary | ICD-10-CM | POA: Diagnosis not present

## 2023-10-20 DIAGNOSIS — E1122 Type 2 diabetes mellitus with diabetic chronic kidney disease: Secondary | ICD-10-CM | POA: Diagnosis not present

## 2023-10-20 DIAGNOSIS — I071 Rheumatic tricuspid insufficiency: Secondary | ICD-10-CM | POA: Diagnosis not present

## 2023-10-20 DIAGNOSIS — D72829 Elevated white blood cell count, unspecified: Secondary | ICD-10-CM | POA: Diagnosis not present

## 2023-10-20 DIAGNOSIS — N189 Chronic kidney disease, unspecified: Secondary | ICD-10-CM | POA: Diagnosis not present

## 2023-10-20 DIAGNOSIS — J45909 Unspecified asthma, uncomplicated: Secondary | ICD-10-CM | POA: Diagnosis not present

## 2023-10-20 DIAGNOSIS — D62 Acute posthemorrhagic anemia: Secondary | ICD-10-CM | POA: Diagnosis not present

## 2023-10-20 DIAGNOSIS — R918 Other nonspecific abnormal finding of lung field: Secondary | ICD-10-CM | POA: Diagnosis not present

## 2023-10-20 DIAGNOSIS — I442 Atrioventricular block, complete: Secondary | ICD-10-CM | POA: Diagnosis not present

## 2023-10-20 DIAGNOSIS — E785 Hyperlipidemia, unspecified: Secondary | ICD-10-CM | POA: Diagnosis not present

## 2023-10-20 DIAGNOSIS — J90 Pleural effusion, not elsewhere classified: Secondary | ICD-10-CM | POA: Diagnosis not present

## 2023-10-20 DIAGNOSIS — Z9889 Other specified postprocedural states: Secondary | ICD-10-CM | POA: Diagnosis not present

## 2023-10-20 DIAGNOSIS — I97190 Other postprocedural cardiac functional disturbances following cardiac surgery: Secondary | ICD-10-CM | POA: Diagnosis not present

## 2023-10-20 DIAGNOSIS — Z1152 Encounter for screening for COVID-19: Secondary | ICD-10-CM | POA: Diagnosis not present

## 2023-10-20 DIAGNOSIS — Q2112 Patent foramen ovale: Secondary | ICD-10-CM | POA: Diagnosis not present

## 2023-10-20 DIAGNOSIS — K5900 Constipation, unspecified: Secondary | ICD-10-CM | POA: Diagnosis not present

## 2023-10-20 DIAGNOSIS — J811 Chronic pulmonary edema: Secondary | ICD-10-CM | POA: Diagnosis not present

## 2023-10-21 DIAGNOSIS — Z952 Presence of prosthetic heart valve: Secondary | ICD-10-CM | POA: Diagnosis not present

## 2023-10-21 DIAGNOSIS — J811 Chronic pulmonary edema: Secondary | ICD-10-CM | POA: Diagnosis not present

## 2023-10-21 DIAGNOSIS — D62 Acute posthemorrhagic anemia: Secondary | ICD-10-CM | POA: Diagnosis not present

## 2023-10-21 DIAGNOSIS — G8918 Other acute postprocedural pain: Secondary | ICD-10-CM | POA: Diagnosis not present

## 2023-10-22 DIAGNOSIS — J811 Chronic pulmonary edema: Secondary | ICD-10-CM | POA: Diagnosis not present

## 2023-10-22 DIAGNOSIS — Z9889 Other specified postprocedural states: Secondary | ICD-10-CM | POA: Diagnosis not present

## 2023-10-23 DIAGNOSIS — Z794 Long term (current) use of insulin: Secondary | ICD-10-CM | POA: Diagnosis not present

## 2023-10-23 DIAGNOSIS — I517 Cardiomegaly: Secondary | ICD-10-CM | POA: Diagnosis not present

## 2023-10-23 DIAGNOSIS — Z952 Presence of prosthetic heart valve: Secondary | ICD-10-CM | POA: Diagnosis not present

## 2023-10-23 DIAGNOSIS — E871 Hypo-osmolality and hyponatremia: Secondary | ICD-10-CM | POA: Diagnosis not present

## 2023-10-23 DIAGNOSIS — E1165 Type 2 diabetes mellitus with hyperglycemia: Secondary | ICD-10-CM | POA: Diagnosis not present

## 2023-10-23 DIAGNOSIS — E669 Obesity, unspecified: Secondary | ICD-10-CM | POA: Diagnosis not present

## 2023-10-23 DIAGNOSIS — I3481 Nonrheumatic mitral (valve) annulus calcification: Secondary | ICD-10-CM | POA: Diagnosis not present

## 2023-10-23 DIAGNOSIS — D649 Anemia, unspecified: Secondary | ICD-10-CM | POA: Diagnosis not present

## 2023-10-23 DIAGNOSIS — D72829 Elevated white blood cell count, unspecified: Secondary | ICD-10-CM | POA: Diagnosis not present

## 2023-10-23 DIAGNOSIS — Z6837 Body mass index (BMI) 37.0-37.9, adult: Secondary | ICD-10-CM | POA: Diagnosis not present

## 2023-10-24 DIAGNOSIS — J90 Pleural effusion, not elsewhere classified: Secondary | ICD-10-CM | POA: Diagnosis not present

## 2023-10-24 DIAGNOSIS — Z6837 Body mass index (BMI) 37.0-37.9, adult: Secondary | ICD-10-CM | POA: Diagnosis not present

## 2023-10-24 DIAGNOSIS — I071 Rheumatic tricuspid insufficiency: Secondary | ICD-10-CM | POA: Diagnosis not present

## 2023-10-24 DIAGNOSIS — E1165 Type 2 diabetes mellitus with hyperglycemia: Secondary | ICD-10-CM | POA: Diagnosis not present

## 2023-10-24 DIAGNOSIS — Z79899 Other long term (current) drug therapy: Secondary | ICD-10-CM | POA: Diagnosis not present

## 2023-10-24 DIAGNOSIS — J9811 Atelectasis: Secondary | ICD-10-CM | POA: Diagnosis not present

## 2023-10-24 DIAGNOSIS — Z471 Aftercare following joint replacement surgery: Secondary | ICD-10-CM | POA: Diagnosis not present

## 2023-10-24 DIAGNOSIS — E669 Obesity, unspecified: Secondary | ICD-10-CM | POA: Diagnosis not present

## 2023-10-24 DIAGNOSIS — I440 Atrioventricular block, first degree: Secondary | ICD-10-CM | POA: Diagnosis not present

## 2023-10-24 DIAGNOSIS — I517 Cardiomegaly: Secondary | ICD-10-CM | POA: Diagnosis not present

## 2023-10-24 DIAGNOSIS — Z794 Long term (current) use of insulin: Secondary | ICD-10-CM | POA: Diagnosis not present

## 2023-10-24 DIAGNOSIS — Z952 Presence of prosthetic heart valve: Secondary | ICD-10-CM | POA: Diagnosis not present

## 2023-10-25 DIAGNOSIS — Z6837 Body mass index (BMI) 37.0-37.9, adult: Secondary | ICD-10-CM | POA: Diagnosis not present

## 2023-10-25 DIAGNOSIS — E669 Obesity, unspecified: Secondary | ICD-10-CM | POA: Diagnosis not present

## 2023-10-25 DIAGNOSIS — J90 Pleural effusion, not elsewhere classified: Secondary | ICD-10-CM | POA: Diagnosis not present

## 2023-10-25 DIAGNOSIS — N179 Acute kidney failure, unspecified: Secondary | ICD-10-CM | POA: Diagnosis not present

## 2023-10-25 DIAGNOSIS — Z9889 Other specified postprocedural states: Secondary | ICD-10-CM | POA: Diagnosis not present

## 2023-10-25 DIAGNOSIS — E1165 Type 2 diabetes mellitus with hyperglycemia: Secondary | ICD-10-CM | POA: Diagnosis not present

## 2023-10-25 DIAGNOSIS — J811 Chronic pulmonary edema: Secondary | ICD-10-CM | POA: Diagnosis not present

## 2023-10-25 DIAGNOSIS — Z794 Long term (current) use of insulin: Secondary | ICD-10-CM | POA: Diagnosis not present

## 2023-10-26 DIAGNOSIS — J90 Pleural effusion, not elsewhere classified: Secondary | ICD-10-CM | POA: Diagnosis not present

## 2023-10-26 DIAGNOSIS — E669 Obesity, unspecified: Secondary | ICD-10-CM | POA: Diagnosis not present

## 2023-10-26 DIAGNOSIS — Z6837 Body mass index (BMI) 37.0-37.9, adult: Secondary | ICD-10-CM | POA: Diagnosis not present

## 2023-10-26 DIAGNOSIS — E1165 Type 2 diabetes mellitus with hyperglycemia: Secondary | ICD-10-CM | POA: Diagnosis not present

## 2023-10-26 DIAGNOSIS — J9811 Atelectasis: Secondary | ICD-10-CM | POA: Diagnosis not present

## 2023-10-26 DIAGNOSIS — Z794 Long term (current) use of insulin: Secondary | ICD-10-CM | POA: Diagnosis not present

## 2023-10-26 DIAGNOSIS — N179 Acute kidney failure, unspecified: Secondary | ICD-10-CM | POA: Diagnosis not present

## 2023-10-26 DIAGNOSIS — I517 Cardiomegaly: Secondary | ICD-10-CM | POA: Diagnosis not present

## 2023-10-26 DIAGNOSIS — J811 Chronic pulmonary edema: Secondary | ICD-10-CM | POA: Diagnosis not present

## 2023-10-27 DIAGNOSIS — E669 Obesity, unspecified: Secondary | ICD-10-CM | POA: Diagnosis not present

## 2023-10-27 DIAGNOSIS — I071 Rheumatic tricuspid insufficiency: Secondary | ICD-10-CM | POA: Diagnosis not present

## 2023-10-27 DIAGNOSIS — E1165 Type 2 diabetes mellitus with hyperglycemia: Secondary | ICD-10-CM | POA: Diagnosis not present

## 2023-10-27 DIAGNOSIS — Z794 Long term (current) use of insulin: Secondary | ICD-10-CM | POA: Diagnosis not present

## 2023-10-27 DIAGNOSIS — Z9889 Other specified postprocedural states: Secondary | ICD-10-CM | POA: Diagnosis not present

## 2023-10-27 DIAGNOSIS — Z6837 Body mass index (BMI) 37.0-37.9, adult: Secondary | ICD-10-CM | POA: Diagnosis not present

## 2023-10-28 DIAGNOSIS — I361 Nonrheumatic tricuspid (valve) insufficiency: Secondary | ICD-10-CM | POA: Diagnosis not present

## 2023-10-29 DIAGNOSIS — I361 Nonrheumatic tricuspid (valve) insufficiency: Secondary | ICD-10-CM | POA: Diagnosis not present

## 2023-10-30 ENCOUNTER — Telehealth: Payer: Self-pay | Admitting: *Deleted

## 2023-10-30 NOTE — Transitions of Care (Post Inpatient/ED Visit) (Signed)
10/30/2023  Name: Joshua Cole MRN: [id number] DOB: 11-07-1960  Today's TOC FU Call Status: Today's TOC FU Call Status:: Successful TOC FU Call Completed TOC FU Call Complete Date: 10/30/23 Patient's Name and Date of Birth confirmed.  Transition Care Management Follow-up Telephone Call Date of Discharge: 10/28/23 Discharge Facility: Other [facility]) Name of Other (Non[facility]) Discharge Facility: [facility] Type of Discharge: Inpatient Admission Primary Inpatient Discharge Diagnosis:: Tricuspid valve insufficiency /Tricuspid valve replacement How have you been since you were released from the hospital?: Better Any questions or concerns?: No  Items Reviewed: Did you receive and understand the discharge instructions provided?: Yes Medications obtained,verified, and reconciled?: Yes (Medications Reviewed) Any new allergies since your discharge?: No Dietary orders reviewed?: No Do you have support at home?: Yes People in Home: significant other Name of Support/Comfort Primary Source: Victorino Dike  Medications Reviewed Today: Medications Reviewed Today     Reviewed by Luella Cook, RN (Case Manager) on 10/30/23 at 1118  Med List Status: <None>   Medication Order Taking? Sig Documenting Provider Last Dose Status Informant  acetaminophen (TYLENOL) 500 MG tablet [id number] Yes Take 1,000 mg by mouth 3 (three) times daily. Take 1000 mg by mouth in the morning. Take 500 mg by mouth in the afternoon and at bedtime. [provider] Taking Active Self, Pharmacy Records  albuterol (VENTOLIN HFA) 108 (90 Base) MCG/ACT inhaler [id number] Yes Inhale 1-2 puffs into the lungs every 6 (six) hours as needed for wheezing or shortness of breath. Iloabachie, Chioma E, NP Taking Active Self, Pharmacy Records  apixaban (ELIQUIS) 5 MG TABS tablet [id number] Yes Take 5 mg by mouth 2 (two) times daily. [provider] Taking Active   aspirin EC 81 MG tablet [id number] Yes  Take 81 mg by mouth daily. Swallow whole. [provider] Taking Active            Med Note Jonetta Speak Oct 30, 2023 11:17 AM) Start taking on 01/25/24, AFTER 90 days of apixaban completed.  atorvastatin (LIPITOR) 10 MG tablet [id number] Yes Take 1 tablet (10 mg total) by mouth daily. Debera Lat, PA-C Taking Active   furosemide (LASIX) 40 MG tablet [id number] Yes TAKE 1 TABLET(40 MG) BY MOUTH DAILY Iran Ouch, MD Taking Active   gabapentin (NEURONTIN) 300 MG capsule [id number] Yes TAKE 1 CAPSULE(300 MG) BY MOUTH TWICE DAILY Ostwalt, Janna, PA-C Taking Active   glucose blood (RIGHTEST GS550 BLOOD GLUCOSE) test strip [id number] Yes USE AS DIRECTED TO CHECK BLOOD SUGAR UP TO 4 TIMES DAILY. Iloabachie, Chioma E, NP Taking Active Self, Pharmacy Records  ketotifen (ZADITOR) 0.025 % ophthalmic solution [id number] Yes Place 1 drop into both eyes daily as needed (allergies). [provider] Taking Active Self, Pharmacy Records  loratadine (CLARITIN) 10 MG tablet [id number] Yes Take 10 mg by mouth daily as needed for allergies. [provider] Taking Active Self, Pharmacy Records  metFORMIN (GLUCOPHAGE) 1000 MG tablet [id number] Yes Take 1 tablet (1,000 mg total) by mouth 2 (two) times daily with a meal. Ostwalt, Janna, PA-C Taking Active   potassium chloride (KLOR-CON) 10 MEQ tablet [id number] Yes TAKE 1 TABLET(10 MEQ) BY MOUTH DAILY Iran Ouch, MD Taking Active     Discontinued 10/21/21 1614 (Discontinued by provider)            Med Note (CRUTHIS, CHLOE C   Tue Sep 27, 2022  9:48 AM)    traMADol (ULTRAM) 50 MG tablet [id number] Yes Take by mouth every 6 (  six) hours as needed. [provider] Taking Active            Med Note Ian Malkin, Carlynn Spry Oct 30, 2023 11:16 AM) Take 0.5 tablets (25 mg total) by mouth every six (6) hours as needed for up to 10 days.              Home Care and Equipment/Supplies: Were Home Health Services  Ordered?: NA (Patient declined)  Functional Questionnaire: Do you need assistance with bathing/showering or dressing?: No Do you need assistance with meal preparation?: No Do you need assistance with eating?: No Do you have difficulty maintaining continence: No Do you need assistance with getting out of bed/getting out of a chair/moving?: No Do you have difficulty managing or taking your medications?: No  Follow up appointments reviewed: PCP Follow-up appointment confirmed?: No MD Provider Line [phone] Given: Yes (Patient wanted to making appointment himself) Specialist Hospital Follow-up appointment confirmed?: No Reason Specialist Follow-Up Not Confirmed: Patient has Specialist Provider Number and will Call for Appointment (Patient to be called with appointment/date time following discharge.) Do you need transportation to your follow-up appointment?: No Do you understand care options if your condition(s) worsen?: Yes-patient verbalized understanding  SDOH Interventions Today    Flowsheet Row Most Recent Value  SDOH Interventions   Food Insecurity Interventions Intervention Not Indicated  Housing Interventions Intervention Not Indicated  Transportation Interventions Patient Resources (Friends/Family), Intervention Not Indicated  Utilities Interventions Intervention Not Indicated      Interventions Today    Flowsheet Row Most Recent Value  Chronic Disease   Chronic disease during today's visit Other  [Tricuspid valve insufficiency /Tricuspid valve replacement]  General Interventions   General Interventions Discussed/Reviewed General Interventions Discussed, General Interventions Reviewed, Doctor Visits  Doctor Visits Discussed/Reviewed Doctor Visits Discussed  Exercise Interventions   Exercise Discussed/Reviewed Physical Activity  Physical Activity Discussed/Reviewed Physical Activity Discussed  [No heavy pushing/ pullingwith your arms (less than 10lbs). If arm  movements cause pain, then stop.]  Education Interventions   Education Provided Provided Education  Provided Verbal Education On Other  [wd care and activity]  Pharmacy Interventions   Pharmacy Dicussed/Reviewed Pharmacy Topics Discussed, Pharmacy Topics Reviewed      Patient declined case manager follow up outreach.   Gean Maidens BSN RN [place] [facility] Health Care Management Coordinator [email or web address] Direct Dial: [phone]  Fax: [phone] Website: [email or web address]

## 2023-11-04 DIAGNOSIS — I361 Nonrheumatic tricuspid (valve) insufficiency: Secondary | ICD-10-CM | POA: Diagnosis not present

## 2023-11-11 DIAGNOSIS — Z419 Encounter for procedure for purposes other than remedying health state, unspecified: Secondary | ICD-10-CM | POA: Diagnosis not present

## 2023-11-13 DIAGNOSIS — I071 Rheumatic tricuspid insufficiency: Secondary | ICD-10-CM | POA: Diagnosis not present

## 2023-11-16 DIAGNOSIS — I071 Rheumatic tricuspid insufficiency: Secondary | ICD-10-CM | POA: Diagnosis not present

## 2023-12-21 ENCOUNTER — Ambulatory Visit: Admitting: Physician Assistant

## 2023-12-21 ENCOUNTER — Encounter: Payer: Self-pay | Admitting: Physician Assistant

## 2023-12-21 VITALS — BP 106/69 | HR 96 | Temp 98.1°F | Resp 16 | Wt 242.6 lb

## 2023-12-21 DIAGNOSIS — E66811 Obesity, class 1: Secondary | ICD-10-CM | POA: Diagnosis not present

## 2023-12-21 DIAGNOSIS — Z954 Presence of other heart-valve replacement: Secondary | ICD-10-CM

## 2023-12-21 DIAGNOSIS — E119 Type 2 diabetes mellitus without complications: Secondary | ICD-10-CM

## 2023-12-21 DIAGNOSIS — E1159 Type 2 diabetes mellitus with other circulatory complications: Secondary | ICD-10-CM | POA: Diagnosis not present

## 2023-12-21 DIAGNOSIS — Z711 Person with feared health complaint in whom no diagnosis is made: Secondary | ICD-10-CM

## 2023-12-21 DIAGNOSIS — Z794 Long term (current) use of insulin: Secondary | ICD-10-CM | POA: Diagnosis not present

## 2023-12-21 DIAGNOSIS — E7849 Other hyperlipidemia: Secondary | ICD-10-CM | POA: Diagnosis not present

## 2023-12-21 NOTE — Progress Notes (Signed)
 Established patient visit  Patient: Joshua Cole   DOB: 1961-05-06   63 y.o. Male  MRN: [id number] Visit Date: 12/21/2023  Today's healthcare provider: Blane Bunting, PA-C   Chief Complaint  Patient presents with   Follow-up    F/u up after surgery.no other concerns..some memory loss?   Subjective      Discussed the use of AI scribe software for clinical note transcription with the patient, who gave verbal consent to proceed.  History of Present Illness The patient, with a history of diabetes, knee pain, and recent cardiac surgery, presents for a follow-up visit. He reports feeling well overall, but has noticed some memory issues, such as forgetting whether he let his dogs in or out. The patient mentions that he has been more physically active, including mowing the lawn and crawling under the house. He has not experienced any chest pain, shortness of breath, rapid heart beating, weakness, visual changes, or numbness.  The patient acknowledges not following through with recommended physical therapy and cardiac rehabilitation after his surgery. He expresses a preference for managing his recovery independently, despite the doctor's advice. The patient also admits to not adhering to his diabetic diet since his surgery, consuming more sweets and less healthy foods.       12/21/2023    1:30 PM 07/20/2023   10:05 AM 04/18/2023   10:38 AM  Depression screen PHQ 2/9  Decreased Interest 0 0 0  Down, Depressed, Hopeless 0 0   PHQ - 2 Score 0 0 0  Altered sleeping 0 2 1  Tired, decreased energy 1 2 1   Change in appetite 0 1 0  Feeling bad or failure about yourself  0 0 0  Trouble concentrating 2 0 0  Moving slowly or fidgety/restless 0 0 0  Suicidal thoughts 0 0 0  PHQ-9 Score 3 5 2   Difficult doing work/chores Somewhat difficult Somewhat difficult Not difficult at all      12/21/2023    1:30 PM  GAD 7 : Generalized Anxiety Score  Nervous, Anxious, on Edge 0  Control/stop  worrying 0  Worry too much - different things 0  Trouble relaxing 0  Restless 0  Easily annoyed or irritable 0  Afraid - awful might happen 0  Total GAD 7 Score 0  Anxiety Difficulty Not difficult at all    Medications: Outpatient Medications Prior to Visit  Medication Sig   acetaminophen (TYLENOL) 500 MG tablet Take 1,000 mg by mouth 3 (three) times daily. Take 1000 mg by mouth in the morning. Take 500 mg by mouth in the afternoon and at bedtime.   albuterol (VENTOLIN HFA) 108 (90 Base) MCG/ACT inhaler Inhale 1-2 puffs into the lungs every 6 (six) hours as needed for wheezing or shortness of breath.   apixaban (ELIQUIS) 5 MG TABS tablet Take 5 mg by mouth 2 (two) times daily.   atorvastatin (LIPITOR) 10 MG tablet Take 1 tablet (10 mg total) by mouth daily.   furosemide (LASIX) 40 MG tablet TAKE 1 TABLET(40 MG) BY MOUTH DAILY   gabapentin (NEURONTIN) 300 MG capsule TAKE 1 CAPSULE(300 MG) BY MOUTH TWICE DAILY   glucose blood (RIGHTEST GS550 BLOOD GLUCOSE) test strip USE AS DIRECTED TO CHECK BLOOD SUGAR UP TO 4 TIMES DAILY.   ketotifen (ZADITOR) 0.025 % ophthalmic solution Place 1 drop into both eyes daily as needed (allergies).   loratadine (CLARITIN) 10 MG tablet Take 10 mg by mouth daily as needed for allergies.   metFORMIN (GLUCOPHAGE) 1000 MG tablet  Take 1 tablet (1,000 mg total) by mouth 2 (two) times daily with a meal.   potassium chloride (KLOR-CON) 10 MEQ tablet TAKE 1 TABLET(10 MEQ) BY MOUTH DAILY   traMADol (ULTRAM) 50 MG tablet Take by mouth every 6 (six) hours as needed.   [START ON 01/25/2024] aspirin EC 81 MG tablet Take 81 mg by mouth daily. Swallow whole. (Patient not taking: Reported on 12/21/2023)   No facility-administered medications prior to visit.    Review of Systems All negative Except see HPI       Objective    BP 106/69 (BP Location: Right Arm, Patient Position: Sitting)   Pulse 96   Temp 98.1 F (36.7 C) (Oral)   Resp 16   Wt 242 lb 9.6 oz (110 kg)    SpO2 96%   BMI 34.81 kg/m     Physical Exam Vitals reviewed.  Constitutional:      General: He is not in acute distress.    Appearance: Normal appearance. He is not diaphoretic.  HENT:     Head: Normocephalic and atraumatic.  Eyes:     General: No scleral icterus.    Conjunctiva/sclera: Conjunctivae normal.  Cardiovascular:     Rate and Rhythm: Normal rate and regular rhythm.     Pulses: Normal pulses.     Heart sounds: Normal heart sounds. No murmur heard. Pulmonary:     Effort: Pulmonary effort is normal. No respiratory distress.     Breath sounds: Normal breath sounds. No wheezing or rhonchi.  Musculoskeletal:     Cervical back: Neck supple.     Right lower leg: No edema.     Left lower leg: No edema.  Lymphadenopathy:     Cervical: No cervical adenopathy.  Skin:    General: Skin is warm and dry.     Findings: No rash.  Neurological:     Mental Status: He is alert and oriented to person, place, and time. Mental status is at baseline.  Psychiatric:        Mood and Affect: Mood normal.        Behavior: Behavior normal.      No results found for any visits on 12/21/23.      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  With hx of heart block. Carotid artery disease Significant symptom improvement. Cardiac rehabilitation not yet initiated. Declined a referral to local rehab. On apixaban for three months, then aspirin. - Contact cardiac surgery team for follow-up and cardiac rehabilitation referral. - Continue apixaban 5mg  bid for three months, then switch to aspirin. - Monitor for infection signs and surgical site issues. - Perform blood work to check for infection or complications.  Diabetes management chronic Diabetes with previous A1c of 6.4. Dietary indiscretions post-surgery may affect glycemic control. Emphasized healthy diet importance. Continue metformin  2000? - Check A1c and lipid profile after fasting. - Encourage healthy diet, reduce sweets and high-calorie foods. Will follow-up  Memory concerns Recent memory issues possibly related to post-surgical recovery. Further assessment planned. - Conduct memory testing at next visit. - Recommend starting Centrum multivitamins for men.  Obesity  Chronic, Body mass index is 34.81 kg/m. Weight loss of 5% of pt's current weight via healthy diet and daily exercise encouraged. Ordered labs see below Will follow-up  Hyperlipidemia Chronic Continue lipitor 10mg  Continue lifestyle modifications Recheck lp Will follow-up  Hypertension Chronics and stable Was on furosemide and potassium. Needs to recheck  his labs and reassessed Will follow-up  General Health Maintenance Advised on maintaining a healthy lifestyle with regular exercise and balanced diet. Discussed benefits of professional physical therapy. - Encourage daily walking and stretching. - Advise against overexertion and heavy lifting. - Recommend contacting surgical team for follow-up  and rehabilitation guidance.  No orders of the defined types were placed in this encounter.   No follow-ups on file.   The patient was advised to call back or seek an in-person evaluation if the symptoms worsen or if the condition fails to improve as anticipated.  I discussed the assessment and treatment plan with the patient. The patient was provided an opportunity to ask questions and all were answered. The patient agreed with the plan and demonstrated an understanding of the instructions.  I, Geovonni Meyerhoff, PA-C have reviewed all documentation for this visit. The documentation on 12/21/2023  for the exam, diagnosis, procedures, and orders are all accurate and complete.  Blane Bunting, [facility], MMS [facility] [phone] (phone) [phone] (fax)  [facility]

## 2023-12-23 LAB — COMPREHENSIVE METABOLIC PANEL WITH GFR
ALT: 27 IU/L (ref 0–44)
AST: 33 IU/L (ref 0–40)
Albumin: 4.3 g/dL (ref 3.9–4.9)
Alkaline Phosphatase: 116 IU/L (ref 44–121)
BUN/Creatinine Ratio: 9 — ABNORMAL LOW (ref 10–24)
BUN: 7 mg/dL — ABNORMAL LOW (ref 8–27)
Bilirubin Total: 0.6 mg/dL (ref 0.0–1.2)
CO2: 20 mmol/L (ref 20–29)
Calcium: 9.4 mg/dL (ref 8.6–10.2)
Chloride: 101 mmol/L (ref 96–106)
Creatinine, Ser: 0.75 mg/dL — ABNORMAL LOW (ref 0.76–1.27)
Globulin, Total: 3.8 g/dL (ref 1.5–4.5)
Glucose: 106 mg/dL — ABNORMAL HIGH (ref 70–99)
Potassium: 4.9 mmol/L (ref 3.5–5.2)
Sodium: 135 mmol/L (ref 134–144)
Total Protein: 8.1 g/dL (ref 6.0–8.5)
eGFR: 103 mL/min/{1.73_m2} (ref >59)

## 2023-12-23 LAB — CBC WITH DIFFERENTIAL/PLATELET
Basophils Absolute: 0.1 10*3/uL (ref 0.0–0.2)
Basos: 2 %
EOS (ABSOLUTE): 0.4 10*3/uL (ref 0.0–0.4)
Eos: 8 %
Hematocrit: 37.4 % — ABNORMAL LOW (ref 37.5–51.0)
Hemoglobin: 12 g/dL — ABNORMAL LOW (ref 13.0–17.7)
Immature Grans (Abs): 0 10*3/uL (ref 0.0–0.1)
Immature Granulocytes: 0 %
Lymphocytes Absolute: 1.5 10*3/uL (ref 0.7–3.1)
Lymphs: 28 %
MCH: 29.7 pg (ref 26.6–33.0)
MCHC: 32.1 g/dL (ref 31.5–35.7)
MCV: 93 fL (ref 79–97)
Monocytes Absolute: 0.7 10*3/uL (ref 0.1–0.9)
Monocytes: 12 %
Neutrophils Absolute: 2.8 10*3/uL (ref 1.4–7.0)
Neutrophils: 50 %
Platelets: 127 10*3/uL — ABNORMAL LOW (ref 150–450)
RBC: 4.04 x10E6/uL — ABNORMAL LOW (ref 4.14–5.80)
RDW: 14.5 % (ref 11.6–15.4)
WBC: 5.5 10*3/uL (ref 3.4–10.8)

## 2023-12-23 LAB — LIPID PANEL
Chol/HDL Ratio: 2.8 ratio (ref 0.0–5.0)
Cholesterol, Total: 127 mg/dL (ref 100–199)
HDL: 46 mg/dL (ref >39)
LDL Chol Calc (NIH): 62 mg/dL (ref 0–99)
Triglycerides: 104 mg/dL (ref 0–149)
VLDL Cholesterol Cal: 19 mg/dL (ref 5–40)

## 2023-12-23 LAB — HEMOGLOBIN A1C
Est. average glucose Bld gHb Est-mCnc: 120 mg/dL
Hgb A1c MFr Bld: 5.8 % — ABNORMAL HIGH (ref 4.8–5.6)

## 2023-12-23 LAB — TSH: TSH: 1.54 u[IU]/mL (ref 0.450–4.500)

## 2023-12-26 ENCOUNTER — Encounter: Payer: Self-pay | Admitting: Physician Assistant

## 2024-01-01 ENCOUNTER — Ambulatory Visit: Payer: Medicaid Other | Attending: Physician Assistant | Admitting: Physician Assistant

## 2024-01-01 ENCOUNTER — Encounter: Payer: Self-pay | Admitting: Physician Assistant

## 2024-01-01 VITALS — BP 108/60 | HR 90 | Ht 70.0 in | Wt 241.0 lb

## 2024-01-01 DIAGNOSIS — N179 Acute kidney failure, unspecified: Secondary | ICD-10-CM

## 2024-01-01 DIAGNOSIS — I2510 Atherosclerotic heart disease of native coronary artery without angina pectoris: Secondary | ICD-10-CM | POA: Diagnosis not present

## 2024-01-01 DIAGNOSIS — D649 Anemia, unspecified: Secondary | ICD-10-CM

## 2024-01-01 DIAGNOSIS — Z954 Presence of other heart-valve replacement: Secondary | ICD-10-CM

## 2024-01-01 DIAGNOSIS — I071 Rheumatic tricuspid insufficiency: Secondary | ICD-10-CM

## 2024-01-01 DIAGNOSIS — Z8679 Personal history of other diseases of the circulatory system: Secondary | ICD-10-CM

## 2024-01-01 MED ORDER — FUROSEMIDE 40 MG PO TABS: 40.0000 mg | ORAL_TABLET | Freq: Every day | ORAL | 3 refills | Status: AC | PRN

## 2024-01-01 MED ORDER — POTASSIUM CHLORIDE ER 10 MEQ PO TBCR: 10.0000 meq | EXTENDED_RELEASE_TABLET | Freq: Every day | ORAL | 3 refills | Status: AC | PRN

## 2024-01-01 NOTE — Progress Notes (Signed)
 Cardiology Office Note    Date:  01/01/2024   ID:  Joshua Cole, DOB 1961-03-24, MRN [id number]  PCP:  Ostwalt, Janna, PA-C  Cardiologist:  Antionette Kirks, MD  Electrophysiologist:  Boyce Byes, MD   Chief Complaint: Follow up  History of Present Illness:   Joshua Cole is a 63 y.o. 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    He was admitted in 06/2021 with weakness and hip pain.  He was found to have MSSA bacteremia.  CTA of the chest was negative for PE.  In the setting of bacteremia, TEE showed a tricuspid valve vegetation.  He developed complete heart block with junctional escape and did not require temporary transvenous pacing.  He was transferred to [facility] and seen by CT surgery with recommendation for ongoing antibiotic therapy and outpatient follow-up.  Hospital course was complicated by anemia requiring 2 units of packed red blood cells.  He followed up with ID in the outpatient setting.  Following completion of antibiotics, repeat blood cultures were drawn and negative.  In follow-up with EP in 08/2021, he was in an accelerated junctional rhythm with recommendation for conservative therapy.  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  He was reevaluated by CT surgery in 10/2021, and based on the location of the mass, it  was not felt to be a good candidate for angio VAC debridement.  There was no strong indication for open debridement and it was recommended to continue conservative management.  He was seen in the office in 11/2021 noting chronic dyspnea, and for preoperative cardiac risk stratification.  To further risk stratify, he underwent Lexiscan  MPI in 11/2021 that showed no evidence of ischemia or infarction and was overall low risk.  CT attenuation corrected images showed minimal coronary calcification and moderate aortic atherosclerosis.  He underwent shoulder arthroscopy in 02/2022 without cardiac complication.  He was seen by EP in 02/2022 with EKG showing sinus rhythm and narrow QRS with a prolonged PR interval.  Continued monitoring and conservative therapy was recommended with no indication for permanent pacing.  He has since undergone excisional left total hip arthroplasty with antibiotic spacer placement in 05/2022 followed by removal of antibiotic spacer and left total hip revision arthroplasty in 09/2022.  Deep wound cultures showed no growth.   He was seen in the office on 03/07/2023, reporting an increase in lower extremity swelling and was taking furosemide  20 mg daily.  He was without chest pain, dyspnea, orthopnea, or abdominal distention.  His weight was up 9 pounds when compared to his clinic visit in 02/2022.  It was recommended he increase furosemide  to 40 mg daily.  Due to a slightly uptrending BUN/serum creatinine, furosemide  was subsequently reduced back to 20 mg daily.  Echo on 03/21/2023 showed an EF of 60-65%, no regional wall motion abnormalities, low normal RV systolic function mildly enlarged RV cavity size, moderate biatrial enlargement, mild to moderate mitral regurgitation,  moderate to severe tricuspid regurgitation, aortic valve sclerosis without evidence of stenosis, and an estimated right atrial pressure of 15 mmHg.  Echo results were reviewed with his primary cardiologist with recommendation to  increase furosemide  back to 40 mg daily and pursue a repeat limited echo in 3 to 4 months to reevaluate tricuspid regurgitation.  He was seen in the office in 04/2023 and reported no significant dyspnea, or evidence of volume overload.  He was continued on furosemide  40 mg daily with follow-up labs showing stable renal function.  Limited echo on 06/27/2023 showed an EF of 60 to 65%, no regional wall motion abnormalities, normal RV systolic function with moderately enlarged ventricular cavity size, mild mitral regurgitation, poorly visualized tricuspid valve with moderate to severe regurgitation (though difficult to estimate), and an estimated right atrial pressure of 8 mmHg.      In the setting of his significant tricuspid regurgitation, he was referred to [facility]  Surface echo in 07/2023 showed an EF greater than 55%, torrential tricuspid regurgitation, severely dilated right atrium, mildly dilated left atrium, severely dilated right ventricle with mildly reduced RV systolic function, and mildly thickened aortic valve leaflets with normal excursion.  TEE at that time showed normal LV systolic function, mitral regurgitation, mildly dilated left atrium, thickened aortic valve leaflets with normal excursion, severely dilated RV with moderately reduced systolic function, poorly visualized tricuspid valve leaflets with the anterior and posterior leaflets appearing diminutive with torrential tricuspid regurgitation, and severely dilated right atrium.  Coronary CTA in 07/2023 showed severely dilated RV with mildly reduced RV systolic function with calcified plaque noted in the proximal and mid LAD lies in the distal RCA at the takeoff of the PDA.  CT FFR with hemodynamically significant stenosis in OM1 with a value of 0.61 and RCA with a value of 0.62 (study performed without nitroglycerin ).  Carotid artery ultrasound in 08/2023 showed 50 to 69% right ICA stenosis significant stenosis in the right CCA, and less than 50%  left ICA stenosis.  LHC on 09/28/2023 showed large and normal left main, large LAD with no CAD, 30% proximal LCx stenosis mild OM1 disease, and a large dominant RCA with large RPDA with ostial 40% stenosis.    He underwent successful tricuspid valve replacement and PFO closure at [facility] on 10/20/2023.  Post op echo showed an EF greater than 55% with no regurgitation of prosthetic tricuspid valve and no evidence of intra-atrial shunting by bubble study.  Postoperative course was notable for intermittent sinus tachycardia accompanied by high-grade AV block with accelerated junctional rhythm.  He was evaluated by EP with initial concern for A-fib component however rhythm was deemed to be consistent with heart block with accelerated junctional rhythm.  All AV nodal blocking agents were held with EKG thereafter demonstrating sinus rhythm with secondary AV block.  EP did not recommend PPM at that time.  Postoperative course was also notable for AKI felt to be likely secondary to hypovolemia and he was evaluated by nephrology.  There was also concern for possible infection secondary to unknown etiology with worsening leukocytosis and lethargy.  Cultures were unrevealing.  He was treated with empiric antibiotics.  At the time of discharge there were improved with small bilateral pleural effusions.  He had acute postop blood loss anemia and was treated with 2 units PRBC.  He comes in doing well from a cardiac perspective and is without symptoms of angina or cardiac decompensation.  Functional status has significantly improved postoperatively.  No dyspnea, dizziness, presyncope, or  syncope.  Soreness from surgical incision site continues to improve.  No progressive orthopnea or lower extremity swelling.  Does continue to take furosemide  on a daily basis.'s visit in 09/2023, his weight is down 9 pounds today.  Remains adherent to apixaban twice daily and is without falls or symptoms concerning for bleeding.   Labs  independently reviewed: 12/2023 - TSH normal, TC 127, TG 104, HDL 46, LDL 62, A1c 5.8, BUN 7, serum creatinine 0.75, potassium 4.9, albumin  4.3, AST/ALT normal, Hgb 12.0, PLT 127 10/2023 - magnesium  2.2  Past Medical History:  Diagnosis Date   Arthritis    Asthma    Chronic kidney disease    Diabetes mellitus without complication (HCC)    type 2   DJD (degenerative joint disease)    Dysrhythmia    junctional tachycardia and incomplete heart block   Edema of left lower extremity 07/04/2022   Gout    Heart block    Lung nodules    a. 09/2021 CT chest: Interval decrease in size and number of bilateral lung nodules, likely consistent with sequelae associated with an infectious/inflammatory process.   MSSA bacteremia 06/2021   Rotator cuff tear 08/26/2021   Subacute bacterial endocarditis (SBE)    a. 06/2021 TEE: mobile mass attached to the tricuspid valve-->Abx rx-->09/2021 TEE: EF 55-60%, no rwma, nl RV fxn, mild-mod RAE, mild MR, mobile echodense 11x19mm mass in the TV apparatus-->conservative rx per TCTS.    Past Surgical History:  Procedure Laterality Date   ACHILLES TENDON REPAIR Left    many years ago per pt   ANTERIOR HIP REVISION Left 10/06/2022   Procedure: REMOVAL OF LEFT HIP ANTIBIOTIC SPACER, LEFT TOTAL HIP REVISION ARTHROPLASTY;  Surgeon: Arnie Lao, MD;  Location: [facility] ORS;  Service: Orthopedics;  Laterality: Left;   BIOPSY  07/16/2021   Procedure: BIOPSY;  Surgeon: Daina Drum, MD;  Location: [facility] ENDOSCOPY;  Service: Gastroenterology;;  EGD and COLON   COLONOSCOPY N/A 07/16/2021   Procedure: COLONOSCOPY;  Surgeon: Daina Drum, MD;  Location: [facility] ENDOSCOPY;  Service: Gastroenterology;  Laterality: N/A;   COLONOSCOPY WITH PROPOFOL  N/A 07/16/2021   Procedure: COLONOSCOPY WITH PROPOFOL ;  Surgeon: Daina Drum, MD;  Location: [facility] ENDOSCOPY;  Service: Gastroenterology;  Laterality: N/A;   ESOPHAGOGASTRODUODENOSCOPY (EGD) WITH PROPOFOL  N/A 07/16/2021   Procedure:  ESOPHAGOGASTRODUODENOSCOPY (EGD) WITH PROPOFOL ;  Surgeon: Daina Drum, MD;  Location: [facility] ENDOSCOPY;  Service: Gastroenterology;  Laterality: N/A;   EXCISIONAL TOTAL HIP ARTHROPLASTY WITH ANTIBIOTIC SPACERS Left 05/20/2022   Procedure: EXCISIONAL LEFT TOTAL HIP ARTHROPLASTY WITH ANTIBIOTIC SPACERS;  Surgeon: Arnie Lao, MD;  Location: [facility] ORS;  Service: Orthopedics;  Laterality: Left;   FOOT SURGERY Right    ligaments repaired- many years ago per pt   HERNIA REPAIR     JOINT REPLACEMENT Bilateral    hip   POLYPECTOMY  07/16/2021   Procedure: POLYPECTOMY;  Surgeon: Daina Drum, MD;  Location: [facility] ENDOSCOPY;  Service: Gastroenterology;;   SHOULDER ARTHROSCOPY WITH ROTATOR CUFF REPAIR AND SUBACROMIAL DECOMPRESSION Left 02/10/2022   Procedure: LEFT SHOULDER ARTHROSCOPY WITH EXTENSIVE DEBRIDEMENT, SUBACROMIAL DECOMPRESSION;  Surgeon: Arnie Lao, MD;  Location: [facility] OR;  Service: Orthopedics;  Laterality: Left;   TEE WITHOUT CARDIOVERSION N/A 07/07/2021   Procedure: TRANSESOPHAGEAL ECHOCARDIOGRAM (TEE);  Surgeon: Constancia Delton, MD;  Location: [facility] ORS;  Service: Cardiovascular;  Laterality: N/A;    Current Medications: Current Meds  Medication Sig   acetaminophen  (TYLENOL ) 500 MG tablet Take 1,000 mg by mouth 3 (  three) times daily. Take 1000 mg by mouth in the morning. Take 500 mg by mouth in the afternoon and at bedtime.   albuterol  (VENTOLIN  HFA) 108 (90 Base) MCG/ACT inhaler Inhale 1-2 puffs into the lungs every 6 (six) hours as needed for wheezing or shortness of breath.   apixaban (ELIQUIS) 5 MG TABS tablet Take 5 mg by mouth 2 (two) times daily.   atorvastatin  (LIPITOR) 10 MG tablet Take 1 tablet (10 mg total) by mouth daily.   gabapentin  (NEURONTIN ) 300 MG capsule TAKE 1 CAPSULE(300 MG) BY MOUTH TWICE DAILY   glucose blood (RIGHTEST GS550 BLOOD GLUCOSE) test strip USE AS DIRECTED TO CHECK BLOOD SUGAR UP TO 4 TIMES DAILY.   ketotifen  (ZADITOR ) 0.025 % ophthalmic  solution Place 1 drop into both eyes daily as needed (allergies).   loratadine  (CLARITIN ) 10 MG tablet Take 10 mg by mouth daily as needed for allergies.   metFORMIN  (GLUCOPHAGE ) 1000 MG tablet Take 1 tablet (1,000 mg total) by mouth 2 (two) times daily with a meal.   [DISCONTINUED] furosemide  (LASIX ) 40 MG tablet TAKE 1 TABLET(40 MG) BY MOUTH DAILY    Allergies:   Patient has no known allergies.   Social History   Socioeconomic History   Marital status: Widowed    Spouse name: Not on file   Number of children: 1   Years of education: Not on file   Highest education level: Not on file  Occupational History   Not on file  Tobacco Use   Smoking status: Former    Current packs/day: 0.00    Average packs/day: 0.3 packs/day for 15.0 years (3.8 ttl pk-yrs)    Types: Cigarettes    Start date: 39    Quit date: 2007    Years since quitting: 18.3   Smokeless tobacco: Never  Vaping Use   Vaping status: Never Used  Substance and Sexual Activity   Alcohol  use: Not Currently    Alcohol /week: 20.0 standard drinks of alcohol     Types: 20 Cans of beer per week    Comment: couple of glasses of wine on the weekends   Drug use: Not Currently   Sexual activity: Not on file  Other Topics Concern   Not on file  Social History Narrative   Not on file   Social Drivers of Health   Financial Resource Strain: Low Risk  (10/24/2023)   [facility]   Overall Financial Resource Strain (CARDIA)    Difficulty of Paying Living Expenses: Not hard at all  Food Insecurity: No Food Insecurity (10/30/2023)   Hunger Vital Sign    Worried About Running Out of Food in the Last Year: Never true    Ran Out of Food in the Last Year: Never true  Transportation Needs: No Transportation Needs (10/30/2023)   PRAPARE - Administrator, Civil Service (Medical): No    Lack of Transportation (Non-Medical): No  Physical Activity: Not on file  Stress: Not on file  Social Connections: Not  on file     Family History:  The patient's family history includes Breast cancer in his sister; Diabetes in his brother; Heart attack (age of onset: 52) in his brother; Hyperlipidemia in his brother; Hypertension in his brother; Lung cancer in his father; Other in his mother; Psoriasis in his sister.  ROS:   12-point review of systems is negative unless otherwise noted in the HPI.   EKGs/Labs/Other Studies Reviewed:    Studies reviewed were summarized above.  The additional studies were reviewed today:  Limited echo 10/23/2203 [facility]): Summary   1. Tricuspid valve replacement (33 mm Mitris bioprosthetic valve,  implantation date: 10/20/2023).    2. There is no regurgitation of the prosthetic tricuspid valve.    3. Tricuspid valve Doppler indices are consistent with normal prosthetic  valve function.    4. The left ventricle is normal in size with normal wall thickness.    5. The left ventricular systolic function is normal, LVEF is visually  estimated at > 55%.    6. Mitral annular calcification is present (mild).    7. The mitral valve leaflets are mildly thickened with normal leaflet  mobility.   8. The left atrium is mildly dilated in size.    9. The right ventricle is not well visualized but is probably moderately  dilated in size, with mildly reduced systolic function.    10. The right atrium is mildly dilated in size.    11. There is no evidence of an interatrial flow communication or  intrapulmonary shunt by agitated saline study.  __________  TEE 10/20/2023 [facility]): HEMODYNAMICS  Blood Pressure: 115/60  Heart Rate: 80  Inotropes and Infusions: Epi 0.03   LEFT VENTRICLE  Overall appearance: Normal  Estimated systolic function: Normal (EF 16-10%)   Wall Motion Abnormalities  Basal segments-ant Normal  Basal segments-inf: Normal  Basal segments-ant lat: Normal  Basal segments-inf lat: Hypokinetic  Basal segments-ant sept: Normal  Basal segments-inf sept: Normal  Mid  segments-ant: Normal  Mid segments-inf: Normal  Mid segments-ant lat: Normal  Mid segments-inf lat: Hypokinetic  Mid segments-ant sept: Normal  Mid segments-inf sept: Normal  Apical segments-ant: Normal  Apical segments-inf: Hypokinetic  Apical segments-sept: Normal  Apical segments-lat: Normal   RIGHT VENTRICLE  Overall appearance: Severely dilated  RV Function: Normal   ATRIAL CHAMBERS  Left atrium: Normal  Left atrial appendage: Normal  Right atrium: Moderately dilated  Interatrial Septum: PFO by contrast    EFFUSIONS  Right pleural effusion: None  Left pleural effusion: None  Pericardial: None   VALVES  Aortic Valve  Aortic valve morphology: Normal trileaflet  Function: Normal  Aortic Stenosis severity: None  Aortic Insufficiency severity: None   Mitral Valve  Mitral valve morphology: Normal  Function: Normal  Mitral Regurgitation: Mild  Mitral Stenosis: None   Pulmonary Vein Inflow   Tricuspid Valve  Tricuspid Regurgitation: Severe  Details: Unable to visualize leaflets   Pulmonic Valve  Pulmonic valve morphology: Grossly normal  Pulmonary Insufficiency severity: None   AORTA  Ascending aorta: Grade III  Aortic arch: Not well visualized  Descending aorta: Grade III  __________  LHC 10/07/2023 [facility]): CONCLUSIONS:  - Mild coronary artery disease without apparent flow limiting lesions  - No aortic transvalvular gradient  __________  Carotid artery ultrasound 08/29/2023 [facility]): Summary of Findings   1. Right CCA demonstrates significant plaque.   2. Right ICA stenosis 50 - 69%.   3. The Left ICA stenosis less than 50%.  __________   Cardiac CT TAVR 08/02/2023 [facility]): Impressions:  - Severely dilated RV with mildly reduced systolic function  - Calcific, likely non-obstructive, coronary disease as described below    Coronary Anatomy:  Right dominant with normal coronary ostia.  Calcified plaques noted in the proximal and mid LAD, likely non   obstructive  Calcified plaque in the distal RCA at take off of PDA, likely non  obstructive   Valves:  - TV leaflets are not well seen.  - Normal mitral  and aortic valves (minimal calcification on aortic valve)      CT FFR:  DETAILED FINDINGS:  1. Left Main - normal FFRct extending to the distal segment with a value  of 0.99  2. [LAD] -  Overall normal, the mid to distal LAD was not evaluated  3. [LCX] -  there is a hemodynamically significant stenosis in the OM1  vessel with a FFRct value of 0.61  4. [RCA] -  there is a hemodynamically significant stenosis in the RCA  vessel with a FFRct value of 0.62   CONCLUSION:  Cannot rule out obstructive CAD. Note that this study was performed  without nitroglycerine    2D echo 07/31/2023 [facility]): Summary   1. The left ventricle is normal in size with normal wall thickness.    2. The left ventricular systolic function is normal, LVEF is visually  estimated at > 55%.    3. The aortic valve is trileaflet with mildly thickened leaflets with normal  excursion.   4. The left atrium is mildly dilated in size.    5. The right ventricle is severely dilated in size, with mildly reduced  systolic function.    6. There is torrential tricuspid regurgitation.    7. The right atrium is severely dilated in size.    8. Technically difficult study.  __________   TEE 07/31/2023 [facility]): Summary   1. The left ventricular systolic function is normal.    2. The mitral valve leaflets are mildly thickened with normal leaflet  mobility.   3. There is mild mitral valve regurgitation.    4. The aortic valve is trileaflet with mildly thickened leaflets with normal  excursion.   5. The left atrium is mildly dilated in size.    6. The right ventricle is severely dilated in size, with moderately reduced  systolic function.    7. The tricuspid valve leaflets are poorly visualized; the anterior and  posterior leaflets appear diminutive.    8. There is  torrential tricuspid regurgitation.    9. The right atrium is severely dilated in size.  __________   Limited echo 06/27/2023: 1. Left ventricular ejection fraction, by estimation, is 60 to 65%. Left  ventricular ejection fraction by PLAX is 56 %. The left ventricle has  normal function. The left ventricle has no regional wall motion  abnormalities.   2. Right ventricular systolic function is normal. The right ventricular  size is moderately enlarged. Tricuspid regurgitation signal is inadequate  for assessing PA pressure.   3. The mitral valve is normal in structure. Mild mitral valve  regurgitation. No evidence of mitral stenosis.   4. The tricuspid valve is not well visualized. Tricuspid valve  regurgitation is moderate to severe, though difficult to estimate. No  evidence of tricuspid stenosis   5. The aortic valve has an indeterminant number of cusps. Aortic valve  regurgitation is not visualized. No aortic stenosis is present.   6. The inferior vena cava is dilated in size with >50% respiratory  variability, suggesting right atrial pressure of 8 mmHg.  __________   2D echo 03/21/2023: 1. Left ventricular ejection fraction, by estimation, is 60 to 65%. The  left ventricle has normal function. The left ventricle has no regional  wall motion abnormalities. Left ventricular diastolic parameters are  indeterminate. The average left  ventricular global longitudinal strain is -17.6 %.   2. Right ventricular systolic function is low normal. The right  ventricular size is mildly enlarged. Tricuspid regurgitation signal is  inadequate for assessing PA pressure.   3. Left atrial size was moderately dilated.   4. Right atrial size was moderately dilated.   5. The mitral valve is normal in structure. Mild to moderate mitral valve  regurgitation. No evidence of mitral stenosis.   6. Tricuspid valve is not well visualized. Unable to exclude underlying  valve pathology. Regurgitation is  moderate to severe. Consider TEE if  clinically indicated.   7. The aortic valve is tricuspid. Aortic valve regurgitation is not  visualized. Aortic valve sclerosis is present, with no evidence of aortic  valve stenosis.   8. The inferior vena cava is dilated in size with <50% respiratory  variability, suggesting right atrial pressure of 15 mmHg.  __________   Lexiscan  MPI 12/06/2021:   The study is normal. The study is low risk.   No ST deviation was noted.   LV perfusion is normal. There is no evidence of ischemia. There is no evidence of infarction.   Left ventricular function is normal. End diastolic cavity size is normal. End systolic cavity size is normal.   CT attenuation images show evidence of moderate aortic calcifications and minimal coronary calcifications. __________   2D echo 10/07/2021: 1. Left ventricular ejection fraction, by estimation, is 55 to 60%. The  left ventricle has normal function. The left ventricle has no regional  wall motion abnormalities. Left ventricular diastolic parameters are  indeterminate.   2. Right ventricular systolic function is normal. The right ventricular  size is not well visualized.   3. Right atrial size was mild to moderately dilated.   4. The mitral valve is grossly normal. Mild mitral valve regurgitation.   5. There is a mobile echodense (11 x 7 mm) mass in the tricuspid valve  apparatus (clip 44, 51). Given history of tricuspid mass, clinical  correlation advised.   6. The aortic valve was not well visualized. Aortic valve regurgitation  is not visualized.   7. The inferior vena cava is dilated in size with <50% respiratory  variability, suggesting right atrial pressure of 15 mmHg.  __________   TEE 07/07/2021: 1. Left ventricular ejection fraction, by estimation, is 55 to 60%. The  left ventricle has normal function.   2. Right ventricular systolic function is normal. The right ventricular  size is normal.   3. No left  atrial/left atrial appendage thrombus was detected.   4. The mitral valve is normal in structure. Mild mitral valve  regurgitation.   5. There is a mobile mass attached to the tricuspid valve (clip 132)  consistent with a vegetation given the current clinical context.. The  tricuspid valve is degenerative.   6. The aortic valve is tricuspid. Aortic valve regurgitation is not  visualized.  __________   2D echo 07/04/2021: 1. Left ventricular ejection fraction, by estimation, is 55 to 60%. The  left ventricle has normal function. Left ventricular endocardial border  not optimally defined to evaluate regional wall motion. There is mild left  ventricular hypertrophy. Left  ventricular diastolic parameters are indeterminate.   2. Right ventricular systolic function is normal. The right ventricular  size is normal. Tricuspid regurgitation signal is inadequate for assessing  PA pressure.   3. Left atrial size was mildly dilated.   4. Right atrial size was mildly dilated.   5. The mitral valve is normal in structure. No evidence of mitral valve  regurgitation. No evidence of mitral stenosis.   6. The aortic valve is normal in structure. Aortic valve regurgitation is  not visualized. Mild aortic valve sclerosis is present, with no evidence  of aortic valve stenosis.   7. Aortic dilatation noted. There is borderline dilatation of the aortic  root and of the ascending aorta, measuring 38 mm.   8. The tricuspid was not well visualized. However, there is likely a  mobile vergetation noted on the short axis images. Recommend a TEE.   EKG:  EKG is not ordered today, patient declined.  EKG report from [facility] on 12/10/2023 with sinus tachycardia, 103 bpm, first-degree AV block with a PR interval of 264 ms, prolonged QT, and nonspecific ST-T changes.  Recent Labs: 12/22/2023: ALT 27; BUN 7; Creatinine, Ser 0.75; Hemoglobin 12.0; Platelets 127; Potassium 4.9; Sodium 135; TSH 1.540  Recent Lipid Panel     Component Value Date/Time   CHOL 127 12/22/2023 0910   TRIG 104 12/22/2023 0910   HDL 46 12/22/2023 0910   CHOLHDL 2.8 12/22/2023 0910   CHOLHDL NOT CALCULATED 07/08/2021 0628   VLDL 20 07/08/2021 0628   LDLCALC 62 12/22/2023 0910   LDLDIRECT 65 06/29/2023 1007    PHYSICAL EXAM:    VS:  BP 108/60 (BP Location: Left Arm, Patient Position: Sitting, Cuff Size: Large)   Pulse 90   Ht 5\' 10"  (1.778 m)   Wt 241 lb (109.3 kg)   SpO2 97%   BMI 34.58 kg/m   BMI: Body mass index is 34.58 kg/m.  Physical Exam Vitals reviewed.  Constitutional:      Appearance: He is well-developed.  HENT:     Head: Normocephalic and atraumatic.  Eyes:     General:        Right eye: No discharge.        Left eye: No discharge.  Neck:     Vascular: No JVD.  Cardiovascular:     Rate and Rhythm: Normal rate and regular rhythm.     Heart sounds: Normal heart sounds, S1 normal and S2 normal. Heart sounds not distant. No midsystolic click and no opening snap. No murmur heard.    No friction rub.     Comments: Well-healing anterior midline surgical incision site without active bleeding, bruising, swelling, warmth, erythema, or tenderness to palpation. Pulmonary:     Effort: Pulmonary effort is normal. No respiratory distress.     Breath sounds: Normal breath sounds. No decreased breath sounds, wheezing, rhonchi or rales.  Chest:     Chest wall: No tenderness.  Musculoskeletal:     Cervical back: Normal range of motion.     Right lower leg: No edema.     Left lower leg: No edema.  Skin:    General: Skin is warm and dry.     Nails: There is no clubbing.  Neurological:     Mental Status: He is alert and oriented to person, place, and time.  Psychiatric:        Speech: Speech normal.        Behavior: Behavior normal.        Thought Content: Thought content normal.        Judgment: Judgment normal.     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  Pretricuspid valve repair cardiac cath that  showed mild nonobstructive CAD.  He remains on apixaban through 01/17/2024 as outlined below.  Thereafter, he will be maintained on aspirin  81 mg daily.  He will otherwise continue atorvastatin  10 mg.  No indication for further ischemic testing at this time.  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  Postoperative echo on 10/23/2023 showed preserved LV systolic function with no regurgitation of prosthetic tricuspid valve.  He will continue apixaban 5 mg twice daily through at least 01/17/2024.  Thereafter he will start aspirin  81 mg daily.  SBE prophylaxis is indicated for all dental procedures given history of endocarditis and in the context of valvular replacement.  Plan to update echo in 1 month which will be 3 months from his surgery.  Will require routine monitoring.  Now he is status post repair we will transition his furosemide  to as needed dosing with as needed KCl repletion.  History of complete heart block: Resolved and asymptomatic.  Evaluated by EP during time of acute infection with no indication for pacemaker implantation.  He had redeveloped high-grade AV block following tricuspid valve repair and was again evaluated by EP with no acute indication for pacemaker implantation.  Continue to avoid AV nodal blocking medications.  Carotid artery disease: As outlined above.  AKI: Resolved on labs obtained earlier this month.  Postop anemia: Hemoglobin improved from admission at [facility].    Disposition: F/u with Dr. Alvenia Aus or an APP in 5-6 weeks, and EP as directed.    Medication  Adjustments/Labs and Tests Ordered: Current medicines are reviewed at length with the patient today.  Concerns regarding medicines are outlined above. Medication changes, Labs and Tests ordered today are summarized above and listed in the Patient Instructions accessible in Encounters.   Signed, Varney Gentleman, PA-C 01/01/2024 1:01 PM     [facility] [place] [address] [place], [place] [address] [phone]

## 2024-01-01 NOTE — Patient Instructions (Signed)
 Medication Instructions:  Your physician recommends the following medication changes.  START TAKING: Lasix  40 mg daily as needed for shortness of breath or swelling Potassium 10 mEq daily with lasix   *If you need a refill on your cardiac medications before your next appointment, please call your pharmacy*  Lab Work: None ordered at this time  If you have labs (blood work) drawn today and your tests are completely normal, you will receive your results only by: MyChart Message (if you have MyChart) OR A paper copy in the mail If you have any lab test that is abnormal or we need to change your treatment, we will call you to review the results.  Testing/Procedures: Your physician has requested that you have an echocardiogram in 1 month. Echocardiography is a painless test that uses sound waves to create images of your heart. It provides your doctor with information about the size and shape of your heart and how well your heart's chambers and valves are working.   You may receive an ultrasound enhancing agent through an IV if needed to better visualize your heart during the echo. This procedure takes approximately one hour.  There are no restrictions for this procedure.  This will take place at [address] [facility]) [address], [place] [address]  Please note: We ask at that you not bring children with you during ultrasound (echo/ vascular) testing. Due to room size and safety concerns, children are not allowed in the ultrasound rooms during exams. Our front office staff cannot provide observation of children in our lobby area while testing is being conducted. An adult accompanying a patient to their appointment will only be allowed in the ultrasound room at the discretion of the ultrasound technician under special circumstances. We apologize for any inconvenience.   Follow-Up: At [facility], you and your health needs are our priority.  As part of our continuing  mission to provide you with exceptional heart care, our providers are all part of one team.  This team includes your primary Cardiologist (physician) and Advanced Practice Providers or APPs (Physician Assistants and Nurse Practitioners) who all work together to provide you with the care you need, when you need it.  Your next appointment:   5-6 week(s)  Provider:   You may see Antionette Kirks, MD or Varney Gentleman, PA-C

## 2024-01-22 ENCOUNTER — Ambulatory Visit: Admitting: Physician Assistant

## 2024-01-22 ENCOUNTER — Encounter: Payer: Self-pay | Admitting: Physician Assistant

## 2024-01-22 VITALS — BP 115/54 | HR 84 | Resp 16 | Ht 70.0 in | Wt 246.0 lb

## 2024-01-22 DIAGNOSIS — I152 Hypertension secondary to endocrine disorders: Secondary | ICD-10-CM

## 2024-01-22 DIAGNOSIS — Z954 Presence of other heart-valve replacement: Secondary | ICD-10-CM | POA: Insufficient documentation

## 2024-01-22 DIAGNOSIS — E66811 Obesity, class 1: Secondary | ICD-10-CM | POA: Diagnosis not present

## 2024-01-22 DIAGNOSIS — K429 Umbilical hernia without obstruction or gangrene: Secondary | ICD-10-CM | POA: Insufficient documentation

## 2024-01-22 DIAGNOSIS — E1159 Type 2 diabetes mellitus with other circulatory complications: Secondary | ICD-10-CM | POA: Insufficient documentation

## 2024-01-22 DIAGNOSIS — E7849 Other hyperlipidemia: Secondary | ICD-10-CM | POA: Insufficient documentation

## 2024-01-22 DIAGNOSIS — E119 Type 2 diabetes mellitus without complications: Secondary | ICD-10-CM

## 2024-01-22 DIAGNOSIS — E1169 Type 2 diabetes mellitus with other specified complication: Secondary | ICD-10-CM

## 2024-01-22 DIAGNOSIS — E785 Hyperlipidemia, unspecified: Secondary | ICD-10-CM

## 2024-01-22 DIAGNOSIS — M79601 Pain in right arm: Secondary | ICD-10-CM

## 2024-01-22 DIAGNOSIS — Z794 Long term (current) use of insulin: Secondary | ICD-10-CM

## 2024-01-22 DIAGNOSIS — R413 Other amnesia: Secondary | ICD-10-CM

## 2024-01-22 NOTE — Progress Notes (Signed)
 Established patient visit  Patient: Joshua Cole   DOB: 08-17-1961   63 y.o. Male  MRN: [id number] Visit Date: 01/22/2024  Today's healthcare provider: Blane Bunting, PA-C   Chief Complaint  Patient presents with   Follow-up    1 month f/u no other concerns   Subjective      Discussed the use of AI scribe software for clinical note transcription with the patient, who gave verbal consent to proceed.  History of Present Illness Joshua Cole is a 63 year old male who presents for follow-up and management of his chronic conditions.  He is on apixaban and atorvastatin , with an upcoming echocardiogram and cardiology follow-up post-surgery. His fasting blood sugar is around 117 mg/dL. He has added a multivitamin to his regimen and finds it beneficial. He uses furosemide  as needed and has not required it in a month. He monitors for swelling daily and reports none.  He experiences persistent arm pain, worsened over time, affecting his sleep due to discomfort with movement. He had prior surgery on the arm for scar tissue removal. He manages the pain with stretching exercises and Voltaren gel.  His exercise routine is satisfactory, but he acknowledges dietary improvements are needed. He is incorporating more salads and vegetables and has planted a garden with tomatoes, eggplant, and zucchini.  He consumed more alcohol  than usual during a vacation two weeks ago but has abstained since, due to health concerns and his wife's liver transplant history.  He denies chest pain, shortness of breath, rapid heart rate, or abdominal pain. He has a hernia causing discomfort when pressed but no worsening symptoms. No swelling or memory concerns.       01/22/2024   10:08 AM 12/21/2023    1:30 PM 07/20/2023   10:05 AM  Depression screen PHQ 2/9  Decreased Interest 0 0 0  Down, Depressed, Hopeless 0 0 0  PHQ - 2 Score 0 0 0  Altered sleeping 1 0 2  Tired, decreased energy 2 1 2   Change in  appetite 0 0 1  Feeling bad or failure about yourself  0 0 0  Trouble concentrating 0 2 0  Moving slowly or fidgety/restless 0 0 0  Suicidal thoughts 0 0 0  PHQ-9 Score 3 3 5   Difficult doing work/chores Not difficult at all Somewhat difficult Somewhat difficult      01/22/2024   10:09 AM 12/21/2023    1:30 PM  GAD 7 : Generalized Anxiety Score  Nervous, Anxious, on Edge 0 0  Control/stop worrying 0 0  Worry too much - different things 0 0  Trouble relaxing 0 0  Restless 0 0  Easily annoyed or irritable 2 0  Afraid - awful might happen 0 0  Total GAD 7 Score 2 0  Anxiety Difficulty Somewhat difficult Not difficult at all    Medications: Outpatient Medications Prior to Visit  Medication Sig   acetaminophen  (TYLENOL ) 500 MG tablet Take 1,000 mg by mouth 3 (three) times daily. Take 1000 mg by mouth in the morning. Take 500 mg by mouth in the afternoon and at bedtime.   albuterol  (VENTOLIN  HFA) 108 (90 Base) MCG/ACT inhaler Inhale 1-2 puffs into the lungs every 6 (six) hours as needed for wheezing or shortness of breath.   apixaban (ELIQUIS) 5 MG TABS tablet Take 5 mg by mouth 2 (two) times daily.   atorvastatin  (LIPITOR) 10 MG tablet Take 1 tablet (10 mg total) by mouth daily.   furosemide  (LASIX ) 40 MG tablet Take  1 tablet (40 mg total) by mouth daily as needed (for shortness of breath or swelling).   gabapentin  (NEURONTIN ) 300 MG capsule TAKE 1 CAPSULE(300 MG) BY MOUTH TWICE DAILY   glucose blood (RIGHTEST GS550 BLOOD GLUCOSE) test strip USE AS DIRECTED TO CHECK BLOOD SUGAR UP TO 4 TIMES DAILY.   ketotifen  (ZADITOR ) 0.025 % ophthalmic solution Place 1 drop into both eyes daily as needed (allergies).   loratadine  (CLARITIN ) 10 MG tablet Take 10 mg by mouth daily as needed for allergies.   metFORMIN  (GLUCOPHAGE ) 1000 MG tablet Take 1 tablet (1,000 mg total) by mouth 2 (two) times daily with a meal.   potassium chloride  (KLOR-CON ) 10 MEQ tablet Take 1 tablet (10 mEq total) by mouth  daily as needed (take with lasix ).   [START ON 01/25/2024] aspirin  EC 81 MG tablet Take 81 mg by mouth daily. Swallow whole. (Patient not taking: Reported on 01/22/2024)   No facility-administered medications prior to visit.    Review of Systems All negative Except see HPI       Objective    BP (!) 115/54 (BP Location: Left Arm, Patient Position: Sitting, Cuff Size: Large)   Pulse 84   Resp 16   Ht 5\' 10"  (1.778 m)   Wt 246 lb (111.6 kg)   SpO2 98%   BMI 35.30 kg/m     Physical Exam Vitals reviewed.  Constitutional:      General: He is not in acute distress.    Appearance: Normal appearance. He is obese. He is not diaphoretic.  HENT:     Head: Normocephalic and atraumatic.  Eyes:     General: No scleral icterus.    Conjunctiva/sclera: Conjunctivae normal.  Cardiovascular:     Rate and Rhythm: Normal rate and regular rhythm.     Pulses: Normal pulses.     Heart sounds: Normal heart sounds. No murmur heard. Pulmonary:     Effort: Pulmonary effort is normal. No respiratory distress.     Breath sounds: Normal breath sounds. No wheezing or rhonchi.  Musculoskeletal:     Cervical back: Neck supple.     Right lower leg: No edema.     Left lower leg: No edema.  Lymphadenopathy:     Cervical: No cervical adenopathy.  Skin:    General: Skin is warm and dry.     Findings: No rash.  Neurological:     Mental Status: He is alert and oriented to person, place, and time. Mental status is at baseline.  Psychiatric:        Mood and Affect: Mood normal.        Behavior: Behavior normal.      No results found for any visits on 01/22/24.      Assessment & Plan Post-surgical follow-up Currently on apixaban as per cardiologist's recommendation. Scheduled for an echocardiogram next week and a follow-up with cardiologist Dr. Verdie Gladden the following week. Discussion about transitioning from apixaban to aspirin  in May. - Continue apixaban 5mg  bid as prescribed. - Attend  echocardiogram next week. - Follow-up with cardiologist Dr. Verdie Gladden after echocardiogram. - Discuss transitioning from apixaban to aspirin  with cardiologist.  Arm pain Chronic arm pain, worsening over time. Previous surgery on the arm for suspected rotator cuff tear, which was not torn but had scar tissue. Pain persists, especially at night. Differential diagnosis includes arthritis or rotator cuff issues. He prefers to delay further surgical intervention. - Consider referral to orthopedic specialist if pain persists. - Recommend stretching exercises. - Use  heat and Voltaren gel for pain management.  Type 2 diabetes mellitus without complications Chronic Well-controlled with a fasting blood sugar of 117 mg/dL. Emphasized importance of diet and exercise in managing diabetes and overall cardiovascular health. - Continue current diabetes management. Metformin  2000mg  - Emphasize importance of diet and exercise for diabetes control. Will follow-up  Hernia Umbilical Presence of hernia with occasional pain upon pressure. No worsening reported. - Report any worsening of hernia symptoms. Will monitor  Alcohol  use Chronic since he was a teen Increased consumption during vacation, but no current use reported since. Discussed importance of reducing alcohol  intake due to potential health risks, including liver damage. He expressed concern about health and intention to abstain from alcohol . - Encourage abstinence from alcohol . - Provide information on urgent care for mental health and rehab services if needed.  S/P TVR (tricuspid valve replacement) (Primary) Type 2 diabetes mellitus without complication, with long-term current use of insulin  (HCC) Labs reviewed  Obesity (BMI 30.0-34.9) Chronic Body mass index is 35.3 kg/m. Weight loss of 5% of pt's current weight via healthy diet and daily exercise strongly encouraged. Labs were reviewed with pt Will follow-up  Other hyperlipidemia Chronic  and stable Continue current regimen/atorvastatin  10mg  Continue lifestyle modifications Will fu  Hypertension associated with diabetes (HCC) Chronic and stable No leg swelling, normal pe Continue current regimen/furosemide  40 and potassium as needed Continue lifestyle modifications Will fu  Memory changes Improved Will reassess upon request at the follow-up Will follow-up  No orders of the defined types were placed in this encounter.   No follow-ups on file.   The patient was advised to call back or seek an in-person evaluation if the symptoms worsen or if the condition fails to improve as anticipated.  I discussed the assessment and treatment plan with the patient. The patient was provided an opportunity to ask questions and all were answered. The patient agreed with the plan and demonstrated an understanding of the instructions.  I, Melora Menon, PA-C have reviewed all documentation for this visit. The documentation on 01/22/2024  for the exam, diagnosis, procedures, and orders are all accurate and complete.  Blane Bunting, [facility], MMS [facility] [phone] (phone) [phone] (fax)  [facility]

## 2024-02-01 ENCOUNTER — Ambulatory Visit: Attending: Physician Assistant

## 2024-02-01 DIAGNOSIS — Z954 Presence of other heart-valve replacement: Secondary | ICD-10-CM

## 2024-02-01 LAB — ECHOCARDIOGRAM COMPLETE: S' Lateral: 4.01 cm

## 2024-02-02 ENCOUNTER — Ambulatory Visit: Payer: Self-pay | Admitting: Physician Assistant

## 2024-02-02 NOTE — Progress Notes (Unsigned)
 Cardiology Office Note    Date:  02/07/2024   ID:  Joshua Cole, DOB October 30, 1960, MRN [id number]  PCP:  Ostwalt, Janna, PA-C  Cardiologist:  Antionette Kirks, MD  Electrophysiologist:  Boyce Byes, MD   Chief Complaint: Follow-up  History of Present Illness:   Joshua Cole is a 63 y.o. 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  He was admitted in 06/2021 with weakness and hip pain.  He was found to have MSSA bacteremia.  CTA of the chest was negative for PE.  In the setting of bacteremia, TEE showed a tricuspid valve vegetation.  He developed complete heart block with junctional escape and did not require temporary transvenous pacing.  He was transferred to [facility] and seen by CT surgery with recommendation for ongoing antibiotic therapy and outpatient follow-up.  Hospital course was complicated by anemia requiring 2 units of packed red blood cells.  He followed up with ID in the outpatient setting.  Following completion of antibiotics, repeat blood cultures were drawn and negative.  In follow-up with EP in 08/2021, he was in an accelerated junctional rhythm with recommendation for conservative therapy.  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  He was reevaluated by CT surgery in 10/2021, and based on the location of the mass, it was not felt to  be a good candidate for angio VAC debridement.  There was no strong indication for open debridement and it was recommended to continue conservative management.  He was seen in the office in 11/2021 noting chronic dyspnea, and for preoperative cardiac risk stratification.  To further risk stratify, he underwent Lexiscan  MPI in 11/2021 that showed no evidence of ischemia or infarction and was overall low risk.  CT attenuation corrected images showed minimal coronary calcification and moderate aortic atherosclerosis.  He underwent shoulder arthroscopy in 02/2022 without cardiac complication.  He was seen by EP in 02/2022 with EKG showing sinus rhythm and narrow QRS with a prolonged PR interval.  Continued monitoring and conservative therapy was recommended with no indication for permanent pacing.  He has since undergone excisional left total hip arthroplasty with antibiotic spacer placement in 05/2022 followed by removal of antibiotic spacer and left total hip revision arthroplasty in 09/2022.  Deep wound cultures showed no growth.   He was seen in the office on 03/07/2023, reporting an increase in lower extremity swelling and was taking furosemide  20 mg daily.  He was without chest pain, dyspnea, orthopnea, or abdominal distention.  His weight was up 9 pounds when compared to his clinic visit in 02/2022.  It was recommended he increase furosemide  to 40 mg daily.  Due to a slightly uptrending BUN/serum creatinine, furosemide  was subsequently reduced back to 20 mg daily.  Echo on 03/21/2023 showed an EF of 60-65%, no regional wall motion abnormalities, low normal RV systolic function mildly enlarged RV cavity size, moderate biatrial enlargement, mild to moderate mitral regurgitation, moderate to severe tricuspid  regurgitation, aortic valve sclerosis without evidence of stenosis, and an estimated right atrial pressure of 15 mmHg.  Echo results were reviewed with his primary cardiologist with recommendation to increase furosemide   back to 40 mg daily and pursue a repeat limited echo in 3 to 4 months to reevaluate tricuspid regurgitation.  He was seen in the office in 04/2023 and reported no significant dyspnea, or evidence of volume overload.  He was continued on furosemide  40 mg daily with follow-up labs showing stable renal function.  Limited echo on 06/27/2023 showed an EF of 60 to 65%, no regional wall motion abnormalities, normal RV systolic function with moderately enlarged ventricular cavity size, mild mitral regurgitation, poorly visualized tricuspid valve with moderate to severe regurgitation (though difficult to estimate), and an estimated right atrial pressure of 8 mmHg.      In the setting of his significant tricuspid regurgitation, he was referred to [facility].  Surface echo in 07/2023 showed an EF greater than 55%, torrential tricuspid regurgitation, severely dilated right atrium, mildly dilated left atrium, severely dilated right ventricle with mildly reduced RV systolic function, and mildly thickened aortic valve leaflets with normal excursion.  TEE at that time showed normal LV systolic function, mitral regurgitation, mildly dilated left atrium, thickened aortic valve leaflets with normal excursion, severely dilated RV with moderately reduced systolic function, poorly visualized tricuspid valve leaflets with the anterior and posterior leaflets appearing diminutive with torrential tricuspid regurgitation, and severely dilated right atrium.  Coronary CTA in 07/2023 showed severely dilated RV with mildly reduced RV systolic function with calcified plaque noted in the proximal and mid LAD lies in the distal RCA at the takeoff of the PDA.  CT FFR with hemodynamically significant stenosis in OM1 with a value of 0.61 and RCA with a value of 0.62 (study performed without nitroglycerin ).  Carotid artery ultrasound in 08/2023 showed 50 to 69% right ICA stenosis significant stenosis in the right CCA, and less than 50% left ICA stenosis.  LHC  on 09/28/2023 showed large and normal left main, large LAD with no CAD, 30% proximal LCx stenosis mild OM1 disease, and a large dominant RCA with large RPDA with ostial 40% stenosis.     He underwent successful tricuspid valve replacement and PFO closure at [facility] on 10/20/2023.  Post op echo showed an EF greater than 55% with no regurgitation of prosthetic tricuspid valve and no evidence of intra-atrial shunting by bubble study.  Postoperative course was notable for intermittent sinus tachycardia accompanied by high-grade AV block with accelerated junctional rhythm.  He was evaluated by EP with initial concern for A-fib component however rhythm was deemed to be consistent with heart block with accelerated junctional rhythm.  All AV nodal blocking agents were held with EKG thereafter demonstrating sinus rhythm with secondary AV block.  EP did not recommend PPM at that time.  Postoperative course was also notable for AKI felt to be likely secondary to hypovolemia and he was evaluated by nephrology.  There was also concern for possible infection secondary to unknown etiology with worsening leukocytosis and lethargy.  Cultures were unrevealing.  He was treated with empiric antibiotics.  At the time of discharge there were improved with small bilateral pleural effusions.  He had acute postop blood loss anemia and was treated with 2 units PRBC.  He was most recently seen in the office on 01/01/2024 noting a significant improvement in his functional status following replacement of his tricuspid valve.  Furosemide  was transitioned to as needed dosing.  Echo  on 02/01/2024 showed an EF of 60 to 65%, no regional wall motion abnormalities, mild LVH, normal RV systolic function and ventricular cavity size, severely dilated right atrium, moderately dilated right atrium, mild mitral regurgitation, prior replacement of the tricuspid valve with a mean gradient of 10 mmHg, and an estimated right atrial pressure of 8 mmHg.  He comes in  continuing to do very well from a cardiac perspective and is without symptoms of angina or cardiac decompensation.  No dyspnea, dizziness, presyncope, or syncope.  No significant lower extremity swelling or progressive orthopnea.  His weight is up trending, up 10 pounds today when compared to his visit in 12/2023, though he attributes this to increased appetite and caloric intake.  He has not needed any as needed furosemide .  He did have a tooth that was causing him discomfort following his last visit, though this has improved.  He is anticipating a dental visit in August which would be 6 months after his tricuspid valve replacement.  He has completed course of apixaban and is now on aspirin  81 mg daily.  Overall, he does not have any acute cardiac concerns at this time and feels well.   Labs independently reviewed: 12/2023 - TSH normal, TC 127, TG 104, HDL 46, LDL 62, A1c 5.8, BUN 7, serum creatinine 0.75, potassium 4.9, albumin  4.3, AST/ALT normal, Hgb 12.0, PLT 127 10/2023 - magnesium  2.2  Past Medical History:  Diagnosis Date   Arthritis    Asthma    Chronic kidney disease    Diabetes mellitus without complication (HCC)    type 2   DJD (degenerative joint disease)    Dysrhythmia    junctional tachycardia and incomplete heart block   Edema of left lower extremity 07/04/2022   Gout    Heart block    Lung nodules    a. 09/2021 CT chest: Interval decrease in size and number of bilateral lung nodules, likely consistent with sequelae associated with an infectious/inflammatory process.   MSSA bacteremia 06/2021   Rotator cuff tear 08/26/2021   Subacute bacterial endocarditis (SBE)    a. 06/2021 TEE: mobile mass attached to the tricuspid valve-->Abx rx-->09/2021 TEE: EF 55-60%, no rwma, nl RV fxn, mild-mod RAE, mild MR, mobile echodense 11x34mm mass in the TV apparatus-->conservative rx per TCTS.    Past Surgical History:  Procedure Laterality Date   ACHILLES TENDON REPAIR Left    many years ago  per pt   ANTERIOR HIP REVISION Left 10/06/2022   Procedure: REMOVAL OF LEFT HIP ANTIBIOTIC SPACER, LEFT TOTAL HIP REVISION ARTHROPLASTY;  Surgeon: Arnie Lao, MD;  Location: [facility] ORS;  Service: Orthopedics;  Laterality: Left;   BIOPSY  07/16/2021   Procedure: BIOPSY;  Surgeon: Daina Drum, MD;  Location: [facility] ENDOSCOPY;  Service: Gastroenterology;;  EGD and COLON   COLONOSCOPY N/A 07/16/2021   Procedure: COLONOSCOPY;  Surgeon: Daina Drum, MD;  Location: [facility] ENDOSCOPY;  Service: Gastroenterology;  Laterality: N/A;   COLONOSCOPY WITH PROPOFOL  N/A 07/16/2021   Procedure: COLONOSCOPY WITH PROPOFOL ;  Surgeon: Daina Drum, MD;  Location: [facility] ENDOSCOPY;  Service: Gastroenterology;  Laterality: N/A;   ESOPHAGOGASTRODUODENOSCOPY (EGD) WITH PROPOFOL  N/A 07/16/2021   Procedure: ESOPHAGOGASTRODUODENOSCOPY (EGD) WITH PROPOFOL ;  Surgeon: Daina Drum, MD;  Location: [facility] ENDOSCOPY;  Service: Gastroenterology;  Laterality: N/A;   EXCISIONAL TOTAL HIP ARTHROPLASTY WITH ANTIBIOTIC SPACERS Left 05/20/2022   Procedure: EXCISIONAL LEFT TOTAL HIP ARTHROPLASTY WITH ANTIBIOTIC SPACERS;  Surgeon: Arnie Lao, MD;  Location: [facility] ORS;  Service: Orthopedics;  Laterality: Left;   FOOT SURGERY Right    ligaments repaired- many years ago per pt   HERNIA REPAIR     JOINT REPLACEMENT Bilateral    hip   POLYPECTOMY  07/16/2021   Procedure: POLYPECTOMY;  Surgeon: Daina Drum, MD;  Location: [facility] ENDOSCOPY;  Service: Gastroenterology;;   SHOULDER ARTHROSCOPY WITH ROTATOR CUFF REPAIR AND SUBACROMIAL DECOMPRESSION Left 02/10/2022   Procedure: LEFT SHOULDER ARTHROSCOPY WITH EXTENSIVE DEBRIDEMENT, SUBACROMIAL DECOMPRESSION;  Surgeon: Arnie Lao, MD;  Location: [facility] OR;  Service: Orthopedics;  Laterality: Left;   TEE WITHOUT CARDIOVERSION N/A 07/07/2021   Procedure: TRANSESOPHAGEAL ECHOCARDIOGRAM (TEE);  Surgeon: Constancia Delton, MD;  Location: [facility] ORS;  Service: Cardiovascular;  Laterality:  N/A;    Current Medications: Current Meds  Medication Sig   acetaminophen  (TYLENOL ) 500 MG tablet Take 1,000 mg by mouth 3 (three) times daily. Take 1000 mg by mouth in the morning. Take 500 mg by mouth in the afternoon and at bedtime.   albuterol  (VENTOLIN  HFA) 108 (90 Base) MCG/ACT inhaler Inhale 1-2 puffs into the lungs every 6 (six) hours as needed for wheezing or shortness of breath.   amoxicillin (AMOXIL) 500 MG tablet Take 4 tablets (2,000 mg total) by mouth once for 1 dose. Prior to Dental Procedure   aspirin  EC 81 MG tablet Take 81 mg by mouth daily. Swallow whole.   atorvastatin  (LIPITOR) 10 MG tablet Take 1 tablet (10 mg total) by mouth daily.   furosemide  (LASIX ) 40 MG tablet Take 1 tablet (40 mg total) by mouth daily as needed (for shortness of breath or swelling).   gabapentin  (NEURONTIN ) 300 MG capsule TAKE 1 CAPSULE(300 MG) BY MOUTH TWICE DAILY   glucose blood (RIGHTEST GS550 BLOOD GLUCOSE) test strip USE AS DIRECTED TO CHECK BLOOD SUGAR UP TO 4 TIMES DAILY.   ketotifen  (ZADITOR ) 0.025 % ophthalmic solution Place 1 drop into both eyes daily as needed (allergies).   loratadine  (CLARITIN ) 10 MG tablet Take 10 mg by mouth daily as needed for allergies.   metFORMIN  (GLUCOPHAGE ) 1000 MG tablet Take 1 tablet (1,000 mg total) by mouth 2 (two) times daily with a meal.   potassium chloride  (KLOR-CON ) 10 MEQ tablet Take 1 tablet (10 mEq total) by mouth daily as needed (take with lasix ).    Allergies:   Patient has no known allergies.   Social History   Socioeconomic History   Marital status: Widowed    Spouse name: Not on file   Number of children: 1   Years of education: Not on file   Highest education level: Not on file  Occupational History   Not on file  Tobacco Use   Smoking status: Former    Current packs/day: 0.00    Average packs/day: 0.3 packs/day for 15.0 years (3.8 ttl pk-yrs)    Types: Cigarettes    Start date: 64    Quit date: 2007    Years since quitting:  18.4   Smokeless tobacco: Never  Vaping Use   Vaping status: Never Used  Substance and Sexual Activity   Alcohol  use: Not Currently    Alcohol /week: 20.0 standard drinks of alcohol     Types: 20 Cans of beer per week    Comment: couple of glasses of wine on the weekends   Drug use: Not Currently   Sexual activity: Not on file  Other Topics Concern   Not on file  Social History Narrative   Not on file   Social Drivers of Health   Financial  Resource Strain: Low Risk  (10/24/2023)   Received from [facility]   Overall Financial Resource Strain (CARDIA)    Difficulty of Paying Living Expenses: Not hard at all  Food Insecurity: No Food Insecurity (10/30/2023)   Hunger Vital Sign    Worried About Running Out of Food in the Last Year: Never true    Ran Out of Food in the Last Year: Never true  Transportation Needs: No Transportation Needs (10/30/2023)   PRAPARE - Administrator, Civil Service (Medical): No    Lack of Transportation (Non-Medical): No  Physical Activity: Not on file  Stress: Not on file  Social Connections: Not on file     Family History:  The patient's family history includes Breast cancer in his sister; Diabetes in his brother; Heart attack (age of onset: 43) in his brother; Hyperlipidemia in his brother; Hypertension in his brother; Lung cancer in his father; Other in his mother; Psoriasis in his sister.  ROS:   12-point review of systems is negative unless otherwise noted in the HPI.   EKGs/Labs/Other Studies Reviewed:    Studies reviewed were summarized above. The additional studies were reviewed today:  2D echo 02/01/2024: 1. Left ventricular ejection fraction, by estimation, is 60 to 65%. The  left ventricle has normal function. The left ventricle has no regional  wall motion abnormalities. There is mild left ventricular hypertrophy.  Left ventricular diastolic parameters  are indeterminate.   2. Right ventricular systolic function is  normal. The right ventricular  size is normal.   3. Left atrial size was severely dilated.   4. Right atrial size was moderately dilated.   5. The mitral valve is normal in structure. Mild mitral valve  regurgitation.   6. Mean tricuspid gradient . . The tricuspid valve is has been  repaired/replaced.   7. The aortic valve is grossly normal. Aortic valve regurgitation is not  visualized.   8. The inferior vena cava is dilated in size with >50% respiratory  variability, suggesting right atrial pressure of 8 mmHg.  __________  Limited echo 10/23/2203 [facility]): Summary   1. Tricuspid valve replacement (33 mm Mitris bioprosthetic valve,  implantation date: 10/20/2023).    2. There is no regurgitation of the prosthetic tricuspid valve.    3. Tricuspid valve Doppler indices are consistent with normal prosthetic  valve function.    4. The left ventricle is normal in size with normal wall thickness.    5. The left ventricular systolic function is normal, LVEF is visually  estimated at > 55%.    6. Mitral annular calcification is present (mild).    7. The mitral valve leaflets are mildly thickened with normal leaflet  mobility.   8. The left atrium is mildly dilated in size.    9. The right ventricle is not well visualized but is probably moderately  dilated in size, with mildly reduced systolic function.    10. The right atrium is mildly dilated in size.    11. There is no evidence of an interatrial flow communication or  intrapulmonary shunt by agitated saline study.  __________   TEE 10/20/2023 [facility]): HEMODYNAMICS  Blood Pressure: 115/60  Heart Rate: 80  Inotropes and Infusions: Epi 0.03   LEFT VENTRICLE  Overall appearance: Normal  Estimated systolic function: Normal (EF 40-98%)   Wall Motion Abnormalities  Basal segments-ant Normal  Basal segments-inf: Normal  Basal segments-ant lat: Normal  Basal segments-inf lat: Hypokinetic  Basal segments-ant sept: Normal  Basal  segments-inf sept: Normal  Mid segments-ant: Normal  Mid segments-inf: Normal  Mid segments-ant lat: Normal  Mid segments-inf lat: Hypokinetic  Mid segments-ant sept: Normal  Mid segments-inf sept: Normal  Apical segments-ant: Normal  Apical segments-inf: Hypokinetic  Apical segments-sept: Normal  Apical segments-lat: Normal   RIGHT VENTRICLE  Overall appearance: Severely dilated  RV Function: Normal   ATRIAL CHAMBERS  Left atrium: Normal  Left atrial appendage: Normal  Right atrium: Moderately dilated  Interatrial Septum: PFO by contrast    EFFUSIONS  Right pleural effusion: None  Left pleural effusion: None  Pericardial: None   VALVES  Aortic Valve  Aortic valve morphology: Normal trileaflet  Function: Normal  Aortic Stenosis severity: None  Aortic Insufficiency severity: None   Mitral Valve  Mitral valve morphology: Normal  Function: Normal  Mitral Regurgitation: Mild  Mitral Stenosis: None   Pulmonary Vein Inflow   Tricuspid Valve  Tricuspid Regurgitation: Severe  Details: Unable to visualize leaflets   Pulmonic Valve  Pulmonic valve morphology: Grossly normal  Pulmonary Insufficiency severity: None   AORTA  Ascending aorta: Grade III  Aortic arch: Not well visualized  Descending aorta: Grade III  __________   LHC 10/07/2023 [facility]): CONCLUSIONS:  - Mild coronary artery disease without apparent flow limiting lesions  - No aortic transvalvular gradient  __________   Carotid artery ultrasound 08/29/2023 [facility]): Summary of Findings   1. Right CCA demonstrates significant plaque.   2. Right ICA stenosis 50 - 69%.   3. The Left ICA stenosis less than 50%.  __________   Cardiac CT TAVR 08/02/2023 [facility]): Impressions:  - Severely dilated RV with mildly reduced systolic function  - Calcific, likely non-obstructive, coronary disease as described below    Coronary Anatomy:  Right dominant with normal coronary ostia.  Calcified plaques noted in the  proximal and mid LAD, likely non  obstructive  Calcified plaque in the distal RCA at take off of PDA, likely non  obstructive   Valves:  - TV leaflets are not well seen.  - Normal mitral and aortic valves (minimal calcification on aortic valve)      CT FFR:  DETAILED FINDINGS:  1. Left Main - normal FFRct extending to the distal segment with a value  of 0.99  2. [LAD] -  Overall normal, the mid to distal LAD was not evaluated  3. [LCX] -  there is a hemodynamically significant stenosis in the OM1  vessel with a FFRct value of 0.61  4. [RCA] -  there is a hemodynamically significant stenosis in the RCA  vessel with a FFRct value of 0.62   CONCLUSION:  Cannot rule out obstructive CAD. Note that this study was performed  without nitroglycerine    2D echo 07/31/2023 [facility]): Summary   1. The left ventricle is normal in size with normal wall thickness.    2. The left ventricular systolic function is normal, LVEF is visually  estimated at > 55%.    3. The aortic valve is trileaflet with mildly thickened leaflets with normal  excursion.   4. The left atrium is mildly dilated in size.    5. The right ventricle is severely dilated in size, with mildly reduced  systolic function.    6. There is torrential tricuspid regurgitation.    7. The right atrium is severely dilated in size.    8. Technically difficult study.  __________   TEE 07/31/2023 [facility]): Summary   1. The left ventricular systolic function is normal.  2. The mitral valve leaflets are mildly thickened with normal leaflet  mobility.   3. There is mild mitral valve regurgitation.    4. The aortic valve is trileaflet with mildly thickened leaflets with normal  excursion.   5. The left atrium is mildly dilated in size.    6. The right ventricle is severely dilated in size, with moderately reduced  systolic function.    7. The tricuspid valve leaflets are poorly visualized; the anterior and  posterior leaflets appear  diminutive.    8. There is torrential tricuspid regurgitation.    9. The right atrium is severely dilated in size.  __________   Limited echo 06/27/2023: 1. Left ventricular ejection fraction, by estimation, is 60 to 65%. Left  ventricular ejection fraction by PLAX is 56 %. The left ventricle has  normal function. The left ventricle has no regional wall motion  abnormalities.   2. Right ventricular systolic function is normal. The right ventricular  size is moderately enlarged. Tricuspid regurgitation signal is inadequate  for assessing PA pressure.   3. The mitral valve is normal in structure. Mild mitral valve  regurgitation. No evidence of mitral stenosis.   4. The tricuspid valve is not well visualized. Tricuspid valve  regurgitation is moderate to severe, though difficult to estimate. No  evidence of tricuspid stenosis   5. The aortic valve has an indeterminant number of cusps. Aortic valve  regurgitation is not visualized. No aortic stenosis is present.   6. The inferior vena cava is dilated in size with >50% respiratory  variability, suggesting right atrial pressure of 8 mmHg.  __________   2D echo 03/21/2023: 1. Left ventricular ejection fraction, by estimation, is 60 to 65%. The  left ventricle has normal function. The left ventricle has no regional  wall motion abnormalities. Left ventricular diastolic parameters are  indeterminate. The average left  ventricular global longitudinal strain is -17.6 %.   2. Right ventricular systolic function is low normal. The right  ventricular size is mildly enlarged. Tricuspid regurgitation signal is  inadequate for assessing PA pressure.   3. Left atrial size was moderately dilated.   4. Right atrial size was moderately dilated.   5. The mitral valve is normal in structure. Mild to moderate mitral valve  regurgitation. No evidence of mitral stenosis.   6. Tricuspid valve is not well visualized. Unable to exclude underlying  valve  pathology. Regurgitation is moderate to severe. Consider TEE if  clinically indicated.   7. The aortic valve is tricuspid. Aortic valve regurgitation is not  visualized. Aortic valve sclerosis is present, with no evidence of aortic  valve stenosis.   8. The inferior vena cava is dilated in size with <50% respiratory  variability, suggesting right atrial pressure of 15 mmHg.  __________   Lexiscan  MPI 12/06/2021:   The study is normal. The study is low risk.   No ST deviation was noted.   LV perfusion is normal. There is no evidence of ischemia. There is no evidence of infarction.   Left ventricular function is normal. End diastolic cavity size is normal. End systolic cavity size is normal.   CT attenuation images show evidence of moderate aortic calcifications and minimal coronary calcifications. __________   2D echo 10/07/2021: 1. Left ventricular ejection fraction, by estimation, is 55 to 60%. The  left ventricle has normal function. The left ventricle has no regional  wall motion abnormalities. Left ventricular diastolic parameters are  indeterminate.   2. Right ventricular systolic  function is normal. The right ventricular  size is not well visualized.   3. Right atrial size was mild to moderately dilated.   4. The mitral valve is grossly normal. Mild mitral valve regurgitation.   5. There is a mobile echodense (11 x 7 mm) mass in the tricuspid valve  apparatus (clip 44, 51). Given history of tricuspid mass, clinical  correlation advised.   6. The aortic valve was not well visualized. Aortic valve regurgitation  is not visualized.   7. The inferior vena cava is dilated in size with <50% respiratory  variability, suggesting right atrial pressure of 15 mmHg.  __________   TEE 07/07/2021: 1. Left ventricular ejection fraction, by estimation, is 55 to 60%. The  left ventricle has normal function.   2. Right ventricular systolic function is normal. The right ventricular  size is  normal.   3. No left atrial/left atrial appendage thrombus was detected.   4. The mitral valve is normal in structure. Mild mitral valve  regurgitation.   5. There is a mobile mass attached to the tricuspid valve (clip 132)  consistent with a vegetation given the current clinical context.. The  tricuspid valve is degenerative.   6. The aortic valve is tricuspid. Aortic valve regurgitation is not  visualized.  __________   2D echo 07/04/2021: 1. Left ventricular ejection fraction, by estimation, is 55 to 60%. The  left ventricle has normal function. Left ventricular endocardial border  not optimally defined to evaluate regional wall motion. There is mild left  ventricular hypertrophy. Left  ventricular diastolic parameters are indeterminate.   2. Right ventricular systolic function is normal. The right ventricular  size is normal. Tricuspid regurgitation signal is inadequate for assessing  PA pressure.   3. Left atrial size was mildly dilated.   4. Right atrial size was mildly dilated.   5. The mitral valve is normal in structure. No evidence of mitral valve  regurgitation. No evidence of mitral stenosis.   6. The aortic valve is normal in structure. Aortic valve regurgitation is  not visualized. Mild aortic valve sclerosis is present, with no evidence  of aortic valve stenosis.   7. Aortic dilatation noted. There is borderline dilatation of the aortic  root and of the ascending aorta, measuring 38 mm.   8. The tricuspid was not well visualized. However, there is likely a  mobile vergetation noted on the short axis images. Recommend a TEE.   EKG:  EKG is ordered today.  The EKG ordered today demonstrates NSR, 85 bpm, prolonged first-degree AV block similar to prior tracings, nonspecific ST-T changes  Recent Labs: 12/22/2023: ALT 27; BUN 7; Creatinine, Ser 0.75; Hemoglobin 12.0; Platelets 127; Potassium 4.9; Sodium 135; TSH 1.540  Recent Lipid Panel    Component Value Date/Time    CHOL 127 12/22/2023 0910   TRIG 104 12/22/2023 0910   HDL 46 12/22/2023 0910   CHOLHDL 2.8 12/22/2023 0910   CHOLHDL NOT CALCULATED 07/08/2021 0628   VLDL 20 07/08/2021 0628   LDLCALC 62 12/22/2023 0910   LDLDIRECT 65 06/29/2023 1007    PHYSICAL EXAM:    VS:  BP 136/68   Pulse 85   Ht 5\' 10"  (1.778 m)   Wt 251 lb (113.9 kg)   SpO2 97%   BMI 36.01 kg/m   BMI: Body mass index is 36.01 kg/m.  Physical Exam Vitals reviewed.  Constitutional:      Appearance: He is well-developed.  HENT:     Head: Normocephalic and  atraumatic.  Eyes:     General:        Right eye: No discharge.        Left eye: No discharge.  Cardiovascular:     Rate and Rhythm: Normal rate and regular rhythm.     Heart sounds: Normal heart sounds, S1 normal and S2 normal. Heart sounds not distant. No midsystolic click and no opening snap. No murmur heard.    No friction rub.  Pulmonary:     Effort: Pulmonary effort is normal. No respiratory distress.     Breath sounds: Normal breath sounds. No decreased breath sounds, wheezing, rhonchi or rales.  Chest:     Chest wall: No tenderness.  Musculoskeletal:     Cervical back: Normal range of motion.     Right lower leg: No edema.     Left lower leg: No edema.  Skin:    General: Skin is warm and dry.     Nails: There is no clubbing.  Neurological:     Mental Status: He is alert and oriented to person, place, and time.  Psychiatric:        Speech: Speech normal.        Behavior: Behavior normal.        Thought Content: Thought content normal.        Judgment: Judgment normal.     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  Pretricuspid valve repair cardiac cath showed mild nonobstructive CAD.   Continue aggressive risk factor modification and primary prevention including aspirin  81 mg and atorvastatin  10 mg.  No indication for further ischemic cardiac testing at this time.  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 Postoperative echo on 10/23/2023 showed preserved LV systolic function with no regurgitation of prosthetic tricuspid valve.  Has completed 3 months of anticoagulation, now on aspirin  81 mg daily.  Echo from earlier this month showed normal RV function.  SBE prophylaxis indicated for all dental procedures given history of endocarditis in the context of valvular replacement.  He was provided a prescription for amoxicillin to take prior to dental visits with recommendation to wait at least 6 months from tricuspid valve repair for dental visit if possible.  Has not needed any as needed furosemide .  History of complete heart block: Evaluated by EP during time of acute infection with no indication for pacemaker implantation.  He redeveloped high-grade AV block following tricuspid valve repair at [facility], also with concern for possible A-fib, and was evaluated by EP with no acute indication for pacemaker implantation.  He has a progressive first-degree AV block on EKG today.  Not on AV nodal blocking medications.  Repeat cardiac monitoring with low threshold for EP evaluation.  Recent TSH normal.  Potassium at goal.    Disposition: F/u with Dr. Alvenia Aus or an APP in 6 months.   Medication Adjustments/Labs and Tests Ordered: Current medicines are reviewed at length with the patient today.  Concerns regarding medicines are outlined above. Medication changes, Labs and Tests ordered today are summarized above and listed in the Patient Instructions  accessible in Encounters.   Signed, Varney Gentleman,  PA-C 02/07/2024 12:52 PM     [facility] [place] [address] [place], [place] [address] [phone]

## 2024-02-07 ENCOUNTER — Ambulatory Visit: Attending: Physician Assistant | Admitting: Physician Assistant

## 2024-02-07 ENCOUNTER — Encounter: Payer: Self-pay | Admitting: Physician Assistant

## 2024-02-07 ENCOUNTER — Ambulatory Visit

## 2024-02-07 VITALS — BP 136/68 | HR 85 | Ht 70.0 in | Wt 251.0 lb

## 2024-02-07 DIAGNOSIS — Z8679 Personal history of other diseases of the circulatory system: Secondary | ICD-10-CM

## 2024-02-07 DIAGNOSIS — Z954 Presence of other heart-valve replacement: Secondary | ICD-10-CM | POA: Diagnosis not present

## 2024-02-07 DIAGNOSIS — I2510 Atherosclerotic heart disease of native coronary artery without angina pectoris: Secondary | ICD-10-CM | POA: Diagnosis not present

## 2024-02-07 DIAGNOSIS — I071 Rheumatic tricuspid insufficiency: Secondary | ICD-10-CM

## 2024-02-07 DIAGNOSIS — I440 Atrioventricular block, first degree: Secondary | ICD-10-CM

## 2024-02-07 MED ORDER — AMOXICILLIN 500 MG PO TABS: 2000.0000 mg | ORAL_TABLET | Freq: Once | ORAL | 0 refills | Status: AC

## 2024-02-07 NOTE — Patient Instructions (Signed)
 Medication Instructions:   Take Amoxicillin 500mg  x 4 tabs by mouth prior to Dental Procedure  Continue all other current medications.  *If you need a refill on your cardiac medications before your next appointment, please call your pharmacy*  Lab Work:  No labs ordered today    Testing/Procedures:  Your physician has recommended that you wear a Zio monitor for 14 days.  This monitor is a medical device that records the heart's electrical activity. Doctors most often use these monitors to diagnose arrhythmias. Arrhythmias are problems with the speed or rhythm of the heartbeat. The monitor is a small device applied to your chest. You can wear one while you do your normal daily activities. While wearing this monitor if you have any symptoms to push the button and record what you felt. Once you have worn this monitor for the period of time provider prescribed (Usually 14 days), you will return the monitor device in the postage paid box. Once it is returned they will download the data collected and provide us  with a report which the provider will then review and we will call you with those results. Important tips:  Avoid showering during the first 24 hours of wearing the monitor. Avoid excessive sweating to help maximize wear time. Do not submerge the device, no hot tubs, and no swimming pools. Keep any lotions or oils away from the patch. After 24 hours you may shower with the patch on. Take brief showers with your back facing the shower head.  Do not remove patch once it has been placed because that will interrupt data and decrease adhesive wear time. Push the button when you have any symptoms and write down what you were feeling. Once you have completed wearing your monitor, remove and place into box which has postage paid and place in your outgoing mailbox.  If for some reason you have misplaced your box then call our office and we can provide another box and/or mail it off for you.     If you have labs (blood work) drawn today and your tests are completely normal, you will receive your results only by: MyChart Message (if you have MyChart) OR A paper copy in the mail If you have any lab test that is abnormal or we need to change your treatment, we will call you to review the results.  Follow-Up: At [facility], you and your health needs are our priority.  As part of our continuing mission to provide you with exceptional heart care, our providers are all part of one team.  This team includes your primary Cardiologist (physician) and Advanced Practice Providers or APPs (Physician Assistants and Nurse Practitioners) who all work together to provide you with the care you need, when you need it.  Your next appointment:    6 month(s)  Provider:    Antionette Kirks, MD or Varney Gentleman, PA-C

## 2024-03-11 ENCOUNTER — Other Ambulatory Visit: Payer: Self-pay | Admitting: Physician Assistant

## 2024-03-11 DIAGNOSIS — Z8679 Personal history of other diseases of the circulatory system: Secondary | ICD-10-CM

## 2024-03-11 DIAGNOSIS — G629 Polyneuropathy, unspecified: Secondary | ICD-10-CM

## 2024-03-11 DIAGNOSIS — I440 Atrioventricular block, first degree: Secondary | ICD-10-CM

## 2024-03-12 ENCOUNTER — Ambulatory Visit: Payer: Self-pay | Admitting: Physician Assistant

## 2024-04-15 ENCOUNTER — Other Ambulatory Visit: Payer: Self-pay | Admitting: Physician Assistant

## 2024-04-15 DIAGNOSIS — E119 Type 2 diabetes mellitus without complications: Secondary | ICD-10-CM

## 2024-04-15 DIAGNOSIS — E7800 Pure hypercholesterolemia, unspecified: Secondary | ICD-10-CM

## 2024-04-20 NOTE — Progress Notes (Signed)
 Established patient visit  Patient: Joshua Cole   DOB: 1961/03/24   63 y.o. Male  MRN: [id number] Visit Date: 04/23/2024  Today's healthcare provider: Jolynn Spencer, PA-C   Chief Complaint  Patient presents with   Medical Management of Chronic Issues   Hyperlipidemia   Diabetes    Pt reports taking medication as prescribed. He reports symptoms of foot ulcerations, and paresthesia of the feet. He does not monitor his blood sugar at home   Care Management    Pt believes he received tetanus and zoster vaccines before moving to [place] and would like to check for his records before receiving any additional shots   Subjective     Discussed the use of AI scribe software for clinical note transcription with the patient, who gave verbal consent to proceed.  History of Present Illness Joshua Cole is a 63 year old male with atrial fibrillation and diabetes who presents for follow-up and management of his hernia.  His hernia is enlarging with associated bruising, likely due to blood thinners, and is painful upon touch, especially during showering. He requests a referral for surgical evaluation. No fever is present.  He has atrial fibrillation and was previously on apixaban but is now taking aspirin  and atorvastatin . He wore a monitor previously and was informed of no ongoing heart rhythm issues. He underwent heart surgery six months ago with a blood transfusion and reports no current chest pain or shortness of breath.  He manages diabetes with metformin  2000 mg daily but does not regularly check blood sugar levels.  He has a persistent sore on his skin for about a year, painful to touch with a pain rating of six out of ten, and suspects an ingrown toenail due to discoloration and chronic presence.  He has abstained from alcohol  for five weeks and reports good blood pressure readings at home, around 130 mmHg. No changes in bowel movements. No fever, chest pain, or shortness of breath. He  experienced a recent transient illness lasting a couple of days, described as a 'bug'.       04/23/2024   11:13 AM 01/22/2024   10:08 AM 12/21/2023    1:30 PM  Depression screen PHQ 2/9  Decreased Interest 0 0 0  Down, Depressed, Hopeless 0 0 0  PHQ - 2 Score 0 0 0  Altered sleeping  1 0  Tired, decreased energy  2 1  Change in appetite  0 0  Feeling bad or failure about yourself   0 0  Trouble concentrating  0 2  Moving slowly or fidgety/restless  0 0  Suicidal thoughts  0 0  PHQ-9 Score  3 3  Difficult doing work/chores  Not difficult at all Somewhat difficult      04/23/2024   11:13 AM 01/22/2024   10:09 AM 12/21/2023    1:30 PM  GAD 7 : Generalized Anxiety Score  Nervous, Anxious, on Edge 0 0 0  Control/stop worrying 0 0 0  Worry too much - different things 0 0 0  Trouble relaxing 0 0 0  Restless 0 0 0  Easily annoyed or irritable 1 2 0  Afraid - awful might happen 0 0 0  Total GAD 7 Score 1 2 0  Anxiety Difficulty Not difficult at all Somewhat difficult Not difficult at all    Medications: Outpatient Medications Prior to Visit  Medication Sig   acetaminophen  (TYLENOL ) 500 MG tablet Take 1,000 mg by mouth 3 (three) times daily. Take 1000 mg by  mouth in the morning. Take 500 mg by mouth in the afternoon and at bedtime.   albuterol  (VENTOLIN  HFA) 108 (90 Base) MCG/ACT inhaler Inhale 1-2 puffs into the lungs every 6 (six) hours as needed for wheezing or shortness of breath.   aspirin  EC 81 MG tablet Take 81 mg by mouth daily. Swallow whole.   atorvastatin  (LIPITOR) 10 MG tablet TAKE 1 TABLET(10 MG) BY MOUTH DAILY   furosemide  (LASIX ) 40 MG tablet Take 1 tablet (40 mg total) by mouth daily as needed (for shortness of breath or swelling).   gabapentin  (NEURONTIN ) 300 MG capsule TAKE 1 CAPSULE(300 MG) BY MOUTH TWICE DAILY   glucose blood (RIGHTEST GS550 BLOOD GLUCOSE) test strip USE AS DIRECTED TO CHECK BLOOD SUGAR UP TO 4 TIMES DAILY.   ketotifen  (ZADITOR ) 0.025 %  ophthalmic solution Place 1 drop into both eyes daily as needed (allergies).   loratadine  (CLARITIN ) 10 MG tablet Take 10 mg by mouth daily as needed for allergies.   metFORMIN  (GLUCOPHAGE ) 1000 MG tablet TAKE 1 TABLET(1000 MG) BY MOUTH TWICE DAILY WITH A MEAL   potassium chloride  (KLOR-CON ) 10 MEQ tablet Take 1 tablet (10 mEq total) by mouth daily as needed (take with lasix ).   apixaban (ELIQUIS) 5 MG TABS tablet Take 5 mg by mouth 2 (two) times daily. (Patient not taking: Reported on 02/07/2024)   No facility-administered medications prior to visit.    Review of Systems  All other systems reviewed and are negative.  All negative Except see HPI       Objective    There were no vitals taken for this visit.    Physical Exam Vitals reviewed.  Constitutional:      General: He is not in acute distress.    Appearance: Normal appearance. He is not diaphoretic.  HENT:     Head: Normocephalic and atraumatic.  Eyes:     General: No scleral icterus.    Conjunctiva/sclera: Conjunctivae normal.  Cardiovascular:     Rate and Rhythm: Normal rate and regular rhythm.     Pulses: Normal pulses.     Heart sounds: Normal heart sounds. No murmur heard. Pulmonary:     Effort: Pulmonary effort is normal. No respiratory distress.     Breath sounds: Normal breath sounds. No wheezing or rhonchi.  Musculoskeletal:     Cervical back: Neck supple.     Right lower leg: No edema.     Left lower leg: No edema.  Lymphadenopathy:     Cervical: No cervical adenopathy.  Skin:    General: Skin is warm and dry.     Findings: No rash.  Neurological:     Mental Status: He is alert and oriented to person, place, and time. Mental status is at baseline.  Psychiatric:        Mood and Affect: Mood normal.        Behavior: Behavior normal.      Results for orders placed or performed in visit on 04/23/24  Hemoglobin A1c  Result Value Ref Range   Hgb A1c MFr Bld 6.0 (H) 4.8 - 5.6 %   Est. average  glucose Bld gHb Est-mCnc 126 mg/dL  Lipid panel  Result Value Ref Range   Cholesterol, Total 152 100 - 199 mg/dL   Triglycerides 809 (H) 0 - 149 mg/dL   HDL 41 >60 mg/dL   VLDL Cholesterol Cal 32 5 - 40 mg/dL   LDL Chol Calc (NIH) 79 0 - 99 mg/dL   Chol/HDL Ratio  3.7 0.0 - 5.0 ratio  Comprehensive metabolic panel with GFR  Result Value Ref Range   Glucose 104 (H) 70 - 99 mg/dL   BUN 12 8 - 27 mg/dL   Creatinine, Ser 9.07 0.76 - 1.27 mg/dL   eGFR 95 >40 fO/fpw/8.26   BUN/Creatinine Ratio 13 10 - 24   Sodium 138 134 - 144 mmol/L   Potassium 4.5 3.5 - 5.2 mmol/L   Chloride 99 96 - 106 mmol/L   CO2 22 20 - 29 mmol/L   Calcium  9.9 8.6 - 10.2 mg/dL   Total Protein 8.3 6.0 - 8.5 g/dL   Albumin  4.8 3.9 - 4.9 g/dL   Globulin, Total 3.5 1.5 - 4.5 g/dL   Bilirubin Total 0.7 0.0 - 1.2 mg/dL   Alkaline Phosphatase 92 44 - 121 IU/L   AST 24 0 - 40 IU/L   ALT 26 0 - 44 IU/L  CBC with Differential/Platelet  Result Value Ref Range   WBC 8.6 3.4 - 10.8 x10E3/uL   RBC 4.61 4.14 - 5.80 x10E6/uL   Hemoglobin 14.8 13.0 - 17.7 g/dL   Hematocrit 55.3 62.4 - 51.0 %   MCV 97 79 - 97 fL   MCH 32.1 26.6 - 33.0 pg   MCHC 33.2 31.5 - 35.7 g/dL   RDW 86.0 88.3 - 84.5 %   Platelets 141 (L) 150 - 450 x10E3/uL   Neutrophils 61 Not Estab. %   Lymphs 24 Not Estab. %   Monocytes 8 Not Estab. %   Eos 6 Not Estab. %   Basos 1 Not Estab. %   Neutrophils Absolute 5.1 1.4 - 7.0 x10E3/uL   Lymphocytes Absolute 2.1 0.7 - 3.1 x10E3/uL   Monocytes Absolute 0.7 0.1 - 0.9 x10E3/uL   EOS (ABSOLUTE) 0.5 (H) 0.0 - 0.4 x10E3/uL   Basophils Absolute 0.1 0.0 - 0.2 x10E3/uL   Immature Granulocytes 0 Not Estab. %   Immature Grans (Abs) 0.0 0.0 - 0.1 x10E3/uL        Assessment & Plan Umbilical hernia Hernia enlarging with discomfort; bruising from aspirin  complicates surgery; cardiology clearance needed. - Refer to [facility] for hernia repair consultation. - Contact cardiology for pre-surgical  clearance. - Place urgent referral due to hernia size and discomfort. Will follow-up  Type 2 diabetes mellitus Chronic Suboptimal diabetes management due to irregular glucose monitoring; continues metformin  2000 mg daily. - Order A1c test to assess diabetes control. - Encourage regular blood glucose monitoring. Encouraged low carb diet and exercise Will follow-up  Non-healing skin lesion, right upper extremity, possible skin cancer Non-healing lesion for a year, painful, possible skin cancer; biopsy may be needed. - Refer to dermatologist for evaluation and possible biopsy.  Chronic toe lesion, possible ingrown toenail, right great toe Chronic lesion suspected ingrown toenail; no infection but risk due to diabetes. - Refer to podiatry for evaluation and management. - Advise monitoring for infection signs. Will follow-up  Coronary artery disease/s/p tvr Managed with aspirin  and atorvastatin ; cardiology clearance needed for surgery. - Continue aspirin  and atorvastatin . - Ensure cardiology clearance before hernia surgery.  Hypertension Chronic Well-controlled with current medication; recent BP around 130 mmHg. Low salt diet and exercise advised Will follow-up  Hyperlipidemia Chronic Managed with atorvastatin  10. 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  Type 2 diabetes mellitus without complication, with long-term current use of insulin  (HCC) (Primary)  - Hemoglobin A1c - Lipid panel - Comprehensive metabolic panel  with GFR - CBC with Differential/Platelet  Obesity (BMI 30.0-34.9) Chronic and stable Weight loss of 5% of pt's current weight via healthy diet and daily exercise encouraged. Advised healthy diet and daily exercise Will follow-up  Umbilical hernia without obstruction and without gangrene  - Ambulatory referral to General Surgery  Orders Placed This Encounter   Procedures   Hemoglobin A1c   Lipid panel    Has the patient fasted?:   Yes   Comprehensive metabolic panel with GFR    Has the patient fasted?:   Yes   CBC with Differential/Platelet   Ambulatory referral to General Surgery    Referral Priority:   Urgent    Referral Type:   Surgical    Referral Reason:   Specialty Services Required    Requested Specialty:   General Surgery    Number of Visits Requested:   1    Return in about 3 months (around 07/24/2024) for chronic disease f/u.   The patient was advised to call back or seek an in-person evaluation if the symptoms worsen or if the condition fails to improve as anticipated.  I discussed the assessment and treatment plan with the patient. The patient was provided an opportunity to ask questions and all were answered. The patient agreed with the plan and demonstrated an understanding of the instructions.  I, Lizzete Gough, PA-C have reviewed all documentation for this visit. The documentation on 04/23/2024  for the exam, diagnosis, procedures, and orders are all accurate and complete.  Jolynn Spencer, [facility], MMS [facility] [phone] (phone) [phone] (fax)  [facility]

## 2024-04-23 ENCOUNTER — Encounter: Payer: Self-pay | Admitting: Physician Assistant

## 2024-04-23 ENCOUNTER — Ambulatory Visit (INDEPENDENT_AMBULATORY_CARE_PROVIDER_SITE_OTHER): Admitting: Physician Assistant

## 2024-04-23 DIAGNOSIS — R413 Other amnesia: Secondary | ICD-10-CM

## 2024-04-23 DIAGNOSIS — L989 Disorder of the skin and subcutaneous tissue, unspecified: Secondary | ICD-10-CM

## 2024-04-23 DIAGNOSIS — E1159 Type 2 diabetes mellitus with other circulatory complications: Secondary | ICD-10-CM | POA: Diagnosis not present

## 2024-04-23 DIAGNOSIS — E1169 Type 2 diabetes mellitus with other specified complication: Secondary | ICD-10-CM | POA: Diagnosis not present

## 2024-04-23 DIAGNOSIS — Z794 Long term (current) use of insulin: Secondary | ICD-10-CM

## 2024-04-23 DIAGNOSIS — I152 Hypertension secondary to endocrine disorders: Secondary | ICD-10-CM

## 2024-04-23 DIAGNOSIS — E66811 Obesity, class 1: Secondary | ICD-10-CM | POA: Diagnosis not present

## 2024-04-23 DIAGNOSIS — E119 Type 2 diabetes mellitus without complications: Secondary | ICD-10-CM | POA: Diagnosis not present

## 2024-04-23 DIAGNOSIS — F1090 Alcohol use, unspecified, uncomplicated: Secondary | ICD-10-CM

## 2024-04-23 DIAGNOSIS — Z954 Presence of other heart-valve replacement: Secondary | ICD-10-CM

## 2024-04-23 DIAGNOSIS — E785 Hyperlipidemia, unspecified: Secondary | ICD-10-CM

## 2024-04-23 DIAGNOSIS — K429 Umbilical hernia without obstruction or gangrene: Secondary | ICD-10-CM

## 2024-04-23 DIAGNOSIS — M79601 Pain in right arm: Secondary | ICD-10-CM

## 2024-04-24 ENCOUNTER — Ambulatory Visit: Payer: Self-pay | Admitting: Physician Assistant

## 2024-04-24 LAB — CBC WITH DIFFERENTIAL/PLATELET
Basophils Absolute: 0.1 x10E3/uL (ref 0.0–0.2)
Basos: 1 %
EOS (ABSOLUTE): 0.5 x10E3/uL — ABNORMAL HIGH (ref 0.0–0.4)
Eos: 6 %
Hematocrit: 44.6 % (ref 37.5–51.0)
Hemoglobin: 14.8 g/dL (ref 13.0–17.7)
Immature Grans (Abs): 0 x10E3/uL (ref 0.0–0.1)
Immature Granulocytes: 0 %
Lymphocytes Absolute: 2.1 x10E3/uL (ref 0.7–3.1)
Lymphs: 24 %
MCH: 32.1 pg (ref 26.6–33.0)
MCHC: 33.2 g/dL (ref 31.5–35.7)
MCV: 97 fL (ref 79–97)
Monocytes Absolute: 0.7 x10E3/uL (ref 0.1–0.9)
Monocytes: 8 %
Neutrophils Absolute: 5.1 x10E3/uL (ref 1.4–7.0)
Neutrophils: 61 %
Platelets: 141 x10E3/uL — ABNORMAL LOW (ref 150–450)
RBC: 4.61 x10E6/uL (ref 4.14–5.80)
RDW: 13.9 % (ref 11.6–15.4)
WBC: 8.6 x10E3/uL (ref 3.4–10.8)

## 2024-04-24 LAB — COMPREHENSIVE METABOLIC PANEL WITH GFR
ALT: 26 IU/L (ref 0–44)
AST: 24 IU/L (ref 0–40)
Albumin: 4.8 g/dL (ref 3.9–4.9)
Alkaline Phosphatase: 92 IU/L (ref 44–121)
BUN/Creatinine Ratio: 13 (ref 10–24)
BUN: 12 mg/dL (ref 8–27)
Bilirubin Total: 0.7 mg/dL (ref 0.0–1.2)
CO2: 22 mmol/L (ref 20–29)
Calcium: 9.9 mg/dL (ref 8.6–10.2)
Chloride: 99 mmol/L (ref 96–106)
Creatinine, Ser: 0.92 mg/dL (ref 0.76–1.27)
Globulin, Total: 3.5 g/dL (ref 1.5–4.5)
Glucose: 104 mg/dL — ABNORMAL HIGH (ref 70–99)
Potassium: 4.5 mmol/L (ref 3.5–5.2)
Sodium: 138 mmol/L (ref 134–144)
Total Protein: 8.3 g/dL (ref 6.0–8.5)
eGFR: 95 mL/min/1.73 (ref >59)

## 2024-04-24 LAB — LIPID PANEL
Chol/HDL Ratio: 3.7 ratio (ref 0.0–5.0)
Cholesterol, Total: 152 mg/dL (ref 100–199)
HDL: 41 mg/dL (ref >39)
LDL Chol Calc (NIH): 79 mg/dL (ref 0–99)
Triglycerides: 190 mg/dL — ABNORMAL HIGH (ref 0–149)
VLDL Cholesterol Cal: 32 mg/dL (ref 5–40)

## 2024-04-24 LAB — HEMOGLOBIN A1C
Est. average glucose Bld gHb Est-mCnc: 126 mg/dL
Hgb A1c MFr Bld: 6 % — ABNORMAL HIGH (ref 4.8–5.6)

## 2024-04-25 ENCOUNTER — Ambulatory Visit: Admitting: General Surgery

## 2024-04-25 ENCOUNTER — Encounter: Payer: Self-pay | Admitting: General Surgery

## 2024-04-25 ENCOUNTER — Telehealth: Payer: Self-pay

## 2024-04-25 VITALS — BP 131/80 | HR 83 | Temp 98.3°F | Ht 70.0 in | Wt 247.6 lb

## 2024-04-25 DIAGNOSIS — K429 Umbilical hernia without obstruction or gangrene: Secondary | ICD-10-CM | POA: Diagnosis not present

## 2024-04-25 DIAGNOSIS — L989 Disorder of the skin and subcutaneous tissue, unspecified: Secondary | ICD-10-CM | POA: Insufficient documentation

## 2024-04-25 DIAGNOSIS — F1090 Alcohol use, unspecified, uncomplicated: Secondary | ICD-10-CM | POA: Insufficient documentation

## 2024-04-25 NOTE — Telephone Encounter (Signed)
 Faxed cardiac clearance to [facility] PA-C at [phone].

## 2024-04-25 NOTE — Patient Instructions (Addendum)
 You have requested for your Umbilical Hernia be repaired. This will be scheduled with Dr. Cornel Diesel at [facility].   If you are on any injectable weight loss medication, you will need to stop taking your GLP-1 injectable (weight loss) medications 8 days before your surgery to avoid any complications with anesthesia.   Please see your (blue)pre-care sheet for information. Our surgery scheduler will call you to verify surgery date and to go over information.   You will need to arrange to be off work for 1-2 weeks but will have to have a lifting restriction of no more than 15 lbs for 6 weeks following your surgery. If you have FMLA or disability paperwork that needs filled out you may drop this off at our office or this can be faxed to [phone].     Umbilical Hernia, Adult A hernia is a bulge of tissue that pushes through an opening between muscles. An umbilical hernia happens in the abdomen, near the belly button (umbilicus). The hernia may contain tissues from the small intestine, large intestine, or fatty tissue covering the intestines (omentum). Umbilical hernias in adults tend to get worse over time, and they require surgical treatment. There are several types of umbilical hernias. You may have: A hernia located just above or below the umbilicus (indirect hernia). This is the most common type of umbilical hernia in adults. A hernia that forms through an opening formed by the umbilicus (direct hernia). A hernia that comes and goes (reducible hernia). A reducible hernia may be visible only when you strain, lift something heavy, or cough. This type of hernia can be pushed back into the abdomen (reduced). A hernia that traps abdominal tissue inside the hernia (incarcerated hernia). This type of hernia cannot be reduced. A hernia that cuts off blood flow to the tissues inside the hernia (strangulated hernia). The tissues can start to die if this happens. This type of hernia  requires emergency treatment.  What are the causes? An umbilical hernia happens when tissue inside the abdomen presses on a weak area of the abdominal muscles. What increases the risk? You may have a greater risk of this condition if you: Are obese. Have had several pregnancies. Have a buildup of fluid inside your abdomen (ascites). Have had surgery that weakens the abdominal muscles.  What are the signs or symptoms? The main symptom of this condition is a painless bulge at or near the belly button. A reducible hernia may be visible only when you strain, lift something heavy, or cough. Other symptoms may include: Dull pain. A feeling of pressure.  Symptoms of a strangulated hernia may include: Pain that gets increasingly worse. Nausea and vomiting. Pain when pressing on the hernia. Skin over the hernia becoming red or purple. Constipation. Blood in the stool.  How is this diagnosed? This condition may be diagnosed based on: A physical exam. You may be asked to cough or strain while standing. These actions increase the pressure inside your abdomen and force the hernia through the opening in your muscles. Your health care provider may try to reduce the hernia by pressing on it. Your symptoms and medical history.  How is this treated? Surgery is the only treatment for an umbilical hernia. Surgery for a strangulated hernia is done as soon as possible. If you have a small hernia that is not incarcerated, you may need to lose weight before having surgery. Follow these instructions at home: Lose weight, if told by your health care provider.  Do not try to push the hernia back in. Watch your hernia for any changes in color or size. Tell your health care provider if any changes occur. You may need to avoid activities that increase pressure on your hernia. Do not lift anything that is heavier than 10 lb (4.5 kg) until your health care provider says that this is safe. Take over-the-counter  and prescription medicines only as told by your health care provider. Keep all follow-up visits as told by your health care provider. This is important. Contact a health care provider if: Your hernia gets larger. Your hernia becomes painful. Get help right away if: You develop sudden, severe pain near the area of your hernia. You have pain as well as nausea or vomiting. You have pain and the skin over your hernia changes color. You develop a fever. This information is not intended to replace advice given to you by your health care provider. Make sure you discuss any questions you have with your health care provider. Document Released: 01/29/2016 Document Revised: 05/01/2016 Document Reviewed: 01/29/2016 Elsevier Interactive Patient Education  [employer].

## 2024-04-26 ENCOUNTER — Telehealth: Payer: Self-pay | Admitting: *Deleted

## 2024-04-26 NOTE — Telephone Encounter (Signed)
 1st attempt to reach pt regarding surgical clearance and the need for an TELE appointment.  Left pt a detailed message to call back and get that scheduled.

## 2024-04-26 NOTE — Telephone Encounter (Signed)
   Pre-operative Risk Assessment    Patient Name: Joshua Cole  DOB: [birth year] MRN: [id number]   Date of last office visit: 02/07/24 RYAN DUNN, [facility] Date of next office visit: NONE   Request for Surgical Clearance    Procedure:  ROBOTIC  UMBILICAL HERNIA REPAIR   Date of Surgery:  Clearance TBD                                Surgeon:  DR. [name] Surgeon's Group or Practice Name:  [facility]  Phone number:  [phone] Fax number:  [phone]   Type of Clearance Requested:   - Medical  - Pharmacy:  Hold Aspirin      Type of Anesthesia:  General    Additional requests/questions:    Bonney Niels Jest   04/26/2024, 8:56 AM

## 2024-04-26 NOTE — Telephone Encounter (Signed)
   Name: Joshua Cole  DOB: 1961-01-03  MRN: [id number]  Primary Cardiologist: Deatrice Cage, MD   Preoperative team, please contact this patient and set up a phone call appointment for further preoperative risk assessment. Please obtain consent and complete medication review. Thank you for your help.  I confirm that guidance regarding antiplatelet and oral anticoagulation therapy has been completed and, if necessary, noted below.  Per office protocol, if patient is without any new symptoms or concerns at the time of their virtual visit, he may hold aspirin  for 7 days prior to procedure. Please resume aspirin  as soon as possible postprocedure, at the discretion of the surgeon.    I also confirmed the patient resides in the state of [place] . As per [facility] Medical Board telemedicine laws, the patient must reside in the state in which the provider is licensed.   Lamarr Satterfield, NP 04/26/2024, 11:40 AM [facility]

## 2024-04-29 NOTE — Telephone Encounter (Signed)
 2nd attempt to reach pt regarding surgical clearance and the need for an TELE appointment.  Left pt a detailed message to call back and get that scheduled.

## 2024-05-02 NOTE — Telephone Encounter (Signed)
 I s/w the pt about scheduling tele preop appt. Pt said surgery not until sometime 07/2024 though not scheduled yet. Pt asked when is the date for his appt with Bernardino Bring, [facility] is; I answered 08/16/24. Pt then said no need to try and move appt up or schedule tele appt, as he will wait and see Bernardino Bring, [facility] 08/2024 and discuss preop clearance. Pt said the hernia surgery is not of urgent and he wait to see Bernardino Bring, [facility].   I assured the pt that I will update all parties involved. Pt thanked me for my call and help today.

## 2024-05-03 NOTE — Progress Notes (Signed)
 Patient ID: Joshua Cole, male   DOB: 04/30/1961, 63 y.o.   MRN: [id number] CC: Umbilical hernia History of Present Illness Joshua Cole is a 63 y.o. male with past medical history significant for coronary artery disease as well as tricuspid regurg status post valve replacement who presents in consultation for umbilical hernia.  The patient reports that he noticed a bulge in his abdomen about a year ago.  He says that it pushes out and he is able to partially push it in.  However, he has pain when he pushes it in.  He denies any constipation or obstipation.  He denies any overlying skin changes from the hernia..  Past Medical History Past Medical History:  Diagnosis Date   Arthritis    Asthma    Chronic kidney disease    Diabetes mellitus without complication (HCC)    type 2   DJD (degenerative joint disease)    Dysrhythmia    junctional tachycardia and incomplete heart block   Edema of left lower extremity 07/04/2022   Gout    Heart block    Lung nodules    a. 09/2021 CT chest: Interval decrease in size and number of bilateral lung nodules, likely consistent with sequelae associated with an infectious/inflammatory process.   MSSA bacteremia 06/2021   Rotator cuff tear 08/26/2021   Subacute bacterial endocarditis (SBE)    a. 06/2021 TEE: mobile mass attached to the tricuspid valve-->Abx rx-->09/2021 TEE: EF 55-60%, no rwma, nl RV fxn, mild-mod RAE, mild MR, mobile echodense 11x60mm mass in the TV apparatus-->conservative rx per TCTS.       Past Surgical History:  Procedure Laterality Date   ACHILLES TENDON REPAIR Left    many years ago per pt   ANTERIOR HIP REVISION Left 10/06/2022   Procedure: REMOVAL OF LEFT HIP ANTIBIOTIC SPACER, LEFT TOTAL HIP REVISION ARTHROPLASTY;  Surgeon: Vernetta Lonni GRADE, MD;  Location: [facility]S;  Service: Orthopedics;  Laterality: Left;   BIOPSY  07/16/2021   Procedure: BIOPSY;  Surgeon: Federico Rosario BROCKS, MD;  Location: [facility] ENDOSCOPY;  Service:  Gastroenterology;;  EGD and COLON   COLONOSCOPY N/A 07/16/2021   Procedure: COLONOSCOPY;  Surgeon: Federico Rosario BROCKS, MD;  Location: [facility] ENDOSCOPY;  Service: Gastroenterology;  Laterality: N/A;   COLONOSCOPY WITH PROPOFOL  N/A 07/16/2021   Procedure: COLONOSCOPY WITH PROPOFOL ;  Surgeon: Federico Rosario BROCKS, MD;  Location: [facility] ENDOSCOPY;  Service: Gastroenterology;  Laterality: N/A;   ESOPHAGOGASTRODUODENOSCOPY (EGD) WITH PROPOFOL  N/A 07/16/2021   Procedure: ESOPHAGOGASTRODUODENOSCOPY (EGD) WITH PROPOFOL ;  Surgeon: Federico Rosario BROCKS, MD;  Location: [facility] ENDOSCOPY;  Service: Gastroenterology;  Laterality: N/A;   EXCISIONAL TOTAL HIP ARTHROPLASTY WITH ANTIBIOTIC SPACERS Left 05/20/2022   Procedure: EXCISIONAL LEFT TOTAL HIP ARTHROPLASTY WITH ANTIBIOTIC SPACERS;  Surgeon: Vernetta Lonni GRADE, MD;  Location: [facility];  Service: Orthopedics;  Laterality: Left;   FOOT SURGERY Right    ligaments repaired- many years ago per pt   HERNIA REPAIR     JOINT REPLACEMENT Bilateral    hip   POLYPECTOMY  07/16/2021   Procedure: POLYPECTOMY;  Surgeon: Federico Rosario BROCKS, MD;  Location: [facility] ENDOSCOPY;  Service: Gastroenterology;;   SHOULDER ARTHROSCOPY WITH ROTATOR CUFF REPAIR AND SUBACROMIAL DECOMPRESSION Left 02/10/2022   Procedure: LEFT SHOULDER ARTHROSCOPY WITH EXTENSIVE DEBRIDEMENT, SUBACROMIAL DECOMPRESSION;  Surgeon: Vernetta Lonni GRADE, MD;  Location: [facility] OR;  Service: Orthopedics;  Laterality: Left;   TEE WITHOUT CARDIOVERSION N/A 07/07/2021   Procedure: TRANSESOPHAGEAL ECHOCARDIOGRAM (TEE);  Surgeon: Darliss Rogue, MD;  Location: [facility] ORS;  Service: Cardiovascular;  Laterality: N/A;    No Known Allergies  Current Outpatient Medications  Medication Sig Dispense Refill   acetaminophen  (TYLENOL ) 500 MG tablet Take 1,000 mg by mouth 3 (three) times daily. Take 1000 mg by mouth in the morning. Take 500 mg by mouth in the afternoon and at bedtime.     albuterol  (VENTOLIN  HFA) 108 (90 Base) MCG/ACT inhaler Inhale 1-2  puffs into the lungs every 6 (six) hours as needed for wheezing or shortness of breath. 6.7 g 3   aspirin  EC 81 MG tablet Take 81 mg by mouth daily. Swallow whole.     atorvastatin  (LIPITOR) 10 MG tablet TAKE 1 TABLET(10 MG) BY MOUTH DAILY 90 tablet 3   furosemide  (LASIX ) 40 MG tablet Take 1 tablet (40 mg total) by mouth daily as needed (for shortness of breath or swelling). 90 tablet 3   gabapentin  (NEURONTIN ) 300 MG capsule TAKE 1 CAPSULE(300 MG) BY MOUTH TWICE DAILY 180 capsule 1   glucose blood (RIGHTEST GS550 BLOOD GLUCOSE) test strip USE AS DIRECTED TO CHECK BLOOD SUGAR UP TO 4 TIMES DAILY. 100 strip 0   ketotifen  (ZADITOR ) 0.025 % ophthalmic solution Place 1 drop into both eyes daily as needed (allergies).     loratadine  (CLARITIN ) 10 MG tablet Take 10 mg by mouth daily as needed for allergies.     metFORMIN  (GLUCOPHAGE ) 1000 MG tablet TAKE 1 TABLET(1000 MG) BY MOUTH TWICE DAILY WITH A MEAL 180 tablet 3   potassium chloride  (KLOR-CON ) 10 MEQ tablet Take 1 tablet (10 mEq total) by mouth daily as needed (take with lasix ). 90 tablet 3   No current facility-administered medications for this visit.    Family History Family History  Problem Relation Age of Onset   Other Mother        unknown medical history   Lung cancer Father    Psoriasis Sister    Breast cancer Sister    Hypertension Brother    Hyperlipidemia Brother    Diabetes Brother    Heart attack Brother 73       Social History Social History   Tobacco Use   Smoking status: Former    Current packs/day: 0.00    Average packs/day: 0.3 packs/day for 15.0 years (3.8 ttl pk-yrs)    Types: Cigarettes    Start date: 19    Quit date: 2007    Years since quitting: 18.6   Smokeless tobacco: Never  Vaping Use   Vaping status: Never Used  Substance Use Topics   Alcohol  use: Not Currently    Alcohol /week: 20.0 standard drinks of alcohol     Types: 20 Cans of beer per week    Comment: couple of glasses of wine on the  weekends   Drug use: Not Currently        ROS Full ROS of systems performed and is otherwise negative there than what is stated in the HPI  Physical Exam Blood pressure 131/80, pulse 83, temperature 98.3 F (36.8 C), temperature source Oral, height 5' 10 (1.778 m), weight 247 lb 9.6 oz (112.3 kg), SpO2 96%.  Alert and oriented x 3, normal breathing on room air, regular rate and rhythm, abdomen is soft, there is an umbilical hernia that is easily reducible.  There is some pain with adduction of the hernia.  No overlying skin changes.  Data Reviewed Last labs reviewed and significant for a hemoglobin A1c of 6.0.  I have personally reviewed the patient's imaging and medical records.    Assessment  Patient with reducible umbilical hernia.  I discussed with him that given he is having pain with it I would recommend that he have a repair.  He will need cardiac restratification prior to an elective surgery.  I also discussed with him that this is not an emergent surgery.  He has plans to travel in October and would like to do this after that.  I discussed with him that we would likely do this open as the hernia defect seems to be small.  I also discussed with him that we would hope to not have to use any mesh and do a primary repair.  Discussed the risk, benefits alternatives of the procedure including risk of infection, bleeding, damage to underlying viscera and recurrence.  We we will plan to see him shortly before his trip in October to discuss surgery dates I did talk with him about the hernia precautions including signs of strangulation such as overlying skin changes, obstruction and increased pain.  A total of 45 minutes was spent reviewing the patient's chart, performing history and physical and discussing treatment options with the patient.   Jayson MALVA Endow

## 2024-06-20 ENCOUNTER — Ambulatory Visit (INDEPENDENT_AMBULATORY_CARE_PROVIDER_SITE_OTHER): Admitting: General Surgery

## 2024-06-20 DIAGNOSIS — K429 Umbilical hernia without obstruction or gangrene: Secondary | ICD-10-CM

## 2024-06-20 NOTE — Progress Notes (Signed)
 Outpatient Surgical Follow Up  06/20/2024  Joshua Cole is an 63 y.o. male.   CC: Umbilical Hernia  This visit was conducted via telemedicine.  I was in my office and the patient was at home.  He was called and identified him with 2 patient identifiers.   HPI: The patient reports doing well.  He says that he still has his umbilical hernia.  He says that it is more annoying than any pain.  He says that sometimes he does have a little erythema around it but not currently.  He reports that he feels like at its biggest is gotten a little bit bigger but is still able to be reduced.  He denies any obstipation symptoms.  He is scheduled to have a visit with cardiology in early December.  Past Medical History:  Diagnosis Date   Arthritis    Asthma    Chronic kidney disease    Diabetes mellitus without complication (HCC)    type 2   DJD (degenerative joint disease)    Dysrhythmia    junctional tachycardia and incomplete heart block   Edema of left lower extremity 07/04/2022   Gout    Heart block    Lung nodules    a. 09/2021 CT chest: Interval decrease in size and number of bilateral lung nodules, likely consistent with sequelae associated with an infectious/inflammatory process.   MSSA bacteremia 06/2021   Rotator cuff tear 08/26/2021   Subacute bacterial endocarditis (SBE)    a. 06/2021 TEE: mobile mass attached to the tricuspid valve-->Abx rx-->09/2021 TEE: EF 55-60%, no rwma, nl RV fxn, mild-mod RAE, mild MR, mobile echodense 11x43mm mass in the TV apparatus-->conservative rx per TCTS.    Past Surgical History:  Procedure Laterality Date   ACHILLES TENDON REPAIR Left    many years ago per pt   ANTERIOR HIP REVISION Left 10/06/2022   Procedure: REMOVAL OF LEFT HIP ANTIBIOTIC SPACER, LEFT TOTAL HIP REVISION ARTHROPLASTY;  Surgeon: Vernetta Lonni GRADE, MD;  Location: [facility];  Service: Orthopedics;  Laterality: Left;   BIOPSY  07/16/2021   Procedure: BIOPSY;  Surgeon: Federico Rosario BROCKS, MD;  [facility] ENDOSCOPY;  Service: Gastroenterology;;  EGD and COLON   COLONOSCOPY N/A 07/16/2021   Procedure: COLONOSCOPY;  Surgeon: Federico Rosario BROCKS, MD;  Location: [facility] ENDOSCOPY;  Service: Gastroenterology;  Laterality: N/A;   COLONOSCOPY WITH PROPOFOL  N/A 07/16/2021   Procedure: COLONOSCOPY WITH PROPOFOL ;  Surgeon: Federico Rosario BROCKS, MD;  Location: [facility] ENDOSCOPY;  Service: Gastroenterology;  Laterality: N/A;   ESOPHAGOGASTRODUODENOSCOPY (EGD) WITH PROPOFOL  N/A 07/16/2021   Procedure: ESOPHAGOGASTRODUODENOSCOPY (EGD) WITH PROPOFOL ;  Surgeon: Federico Rosario BROCKS, MD;  Location: [facility] ENDOSCOPY;  Service: Gastroenterology;  Laterality: N/A;   EXCISIONAL TOTAL HIP ARTHROPLASTY WITH ANTIBIOTIC SPACERS Left 05/20/2022   Procedure: EXCISIONAL LEFT TOTAL HIP ARTHROPLASTY WITH ANTIBIOTIC SPACERS;  Surgeon: Vernetta Lonni GRADE, MD;  Location: [facility];  Service: Orthopedics;  Laterality: Left;   FOOT SURGERY Right    ligaments repaired- many years ago per pt   HERNIA REPAIR     JOINT REPLACEMENT Bilateral    hip   POLYPECTOMY  07/16/2021   Procedure: POLYPECTOMY;  Surgeon: Federico Rosario BROCKS, MD;  Location: [facility] ENDOSCOPY;  Service: Gastroenterology;;   SHOULDER ARTHROSCOPY WITH ROTATOR CUFF REPAIR AND SUBACROMIAL DECOMPRESSION Left 02/10/2022   Procedure: LEFT SHOULDER ARTHROSCOPY WITH EXTENSIVE DEBRIDEMENT, SUBACROMIAL DECOMPRESSION;  Surgeon: Vernetta Lonni GRADE, MD;  Location: [facility] OR;  Service: Orthopedics;  Laterality: Left;   TEE WITHOUT CARDIOVERSION N/A 07/07/2021  Procedure: TRANSESOPHAGEAL ECHOCARDIOGRAM (TEE);  Surgeon: Darliss Rogue, MD;  Location: [facility] ORS;  Service: Cardiovascular;  Laterality: N/A;    Family History  Problem Relation Age of Onset   Other Mother        unknown medical history   Lung cancer Father    Psoriasis Sister    Breast cancer Sister    Hypertension Brother    Hyperlipidemia Brother    Diabetes Brother    Heart attack Brother 21    Social History:  reports  that he quit smoking about 18 years ago. His smoking use included cigarettes. He started smoking about 33 years ago. He has a 3.8 pack-year smoking history. He has never used smokeless tobacco. He reports that he does not currently use alcohol  after a past usage of about 20.0 standard drinks of alcohol  per week. He reports that he does not currently use drugs.  Allergies: No Known Allergies  Medications reviewed.    ROS Full ROS performed and is otherwise negative other than what is stated in HPI   There were no vitals taken for this visit.  Physical Exam No physical exam performed as this was a telehealth visit.    No results found for this or any previous visit (from the past 48 hours). No results found.  Assessment/Plan:  Patient with umbilical hernia.  He is having some pain with it and I did recommend that he get it repaired.  He has things he needs to do in October so now is not a good time for it or be repaired.  Given his history I would like him to have cardiac risk stratification prior to surgical intervention.  He has a visit with his cardiology team in early December.  I discussed with him that we can do this electively on an outpatient basis.  I talked with him that we can do it between now and January if he wants it done prior to the end of the year.  Otherwise, we will plan to see him again in January and then discuss scheduling surgery.  A total of 20 minutes was spent reviewing the patient's chart and performing a interval history over the phone.  [name], M.D. [facility]

## 2024-07-25 ENCOUNTER — Ambulatory Visit: Admitting: Physician Assistant

## 2024-07-25 ENCOUNTER — Encounter: Payer: Self-pay | Admitting: Physician Assistant

## 2024-07-25 VITALS — BP 135/81 | HR 78 | Resp 14 | Ht 70.0 in | Wt 254.0 lb

## 2024-07-25 DIAGNOSIS — Z7984 Long term (current) use of oral hypoglycemic drugs: Secondary | ICD-10-CM | POA: Diagnosis not present

## 2024-07-25 DIAGNOSIS — Z23 Encounter for immunization: Secondary | ICD-10-CM | POA: Diagnosis not present

## 2024-07-25 DIAGNOSIS — E1159 Type 2 diabetes mellitus with other circulatory complications: Secondary | ICD-10-CM

## 2024-07-25 DIAGNOSIS — K429 Umbilical hernia without obstruction or gangrene: Secondary | ICD-10-CM

## 2024-07-25 DIAGNOSIS — E66811 Obesity, class 1: Secondary | ICD-10-CM | POA: Diagnosis not present

## 2024-07-25 DIAGNOSIS — E119 Type 2 diabetes mellitus without complications: Secondary | ICD-10-CM

## 2024-07-25 DIAGNOSIS — E1169 Type 2 diabetes mellitus with other specified complication: Secondary | ICD-10-CM | POA: Diagnosis not present

## 2024-07-25 DIAGNOSIS — H547 Unspecified visual loss: Secondary | ICD-10-CM

## 2024-07-25 DIAGNOSIS — Z96649 Presence of unspecified artificial hip joint: Secondary | ICD-10-CM

## 2024-07-25 DIAGNOSIS — E785 Hyperlipidemia, unspecified: Secondary | ICD-10-CM

## 2024-07-25 DIAGNOSIS — I152 Hypertension secondary to endocrine disorders: Secondary | ICD-10-CM

## 2024-07-25 DIAGNOSIS — Z794 Long term (current) use of insulin: Secondary | ICD-10-CM

## 2024-07-25 NOTE — Progress Notes (Signed)
 Established patient visit  Patient: Joshua Cole   DOB: 1961-02-01   63 y.o. Male  MRN: [id number] Visit Date: 07/25/2024  Today's healthcare provider: Jolynn Spencer, PA-C   Chief Complaint  Patient presents with   Medical Management of Chronic Issues    3 month f/u   Subjective      Discussed the use of AI scribe software for clinical note transcription with the patient, who gave verbal consent to proceed.  History of Present Illness Joshua Cole is a 63 year old male who presents for follow-up and management of multiple chronic conditions.  He underwent heart surgery in February and is scheduled for a cardiology follow-up next month. Blood pressure is reportedly stable, but he has not measured it at home recently. He takes metformin  2000 mg daily for diabetes, with a recent A1c of 6.0. Triglycerides and blood glucose are slightly elevated.  He is awaiting umbilical hernia surgery, postponed due to travel, and is unsure if it is scheduled for January. He cannot access his online medical chart.  He experiences ongoing musculoskeletal pain, particularly in his leg, which he attributes to weather changes. The pain sometimes prevents walking. He uses Tylenol , Voltaren gel, physical therapy, and regular exercise for relief.  He recently traveled to [place], engaging in walking and exercise but also indulging in eating, affecting his weight. He is trying to reduce his weight, which fluctuates daily.  He has a history of skin cancer and was contacted by dermatology for a biopsy but is unsure of the details and did not follow up.  He reports a change in vision since his last eye exam less than a year ago and plans to check insurance coverage for an earlier visit due to diabetes.  He has a scratch on his foot and is aware of potential complications of diabetes. No issues with bowel movements or abdominal pain. Occasional shortness of breath and pain.       04/23/2024   11:13 AM  01/22/2024   10:08 AM 12/21/2023    1:30 PM  Depression screen PHQ 2/9  Decreased Interest 0 0 0  Down, Depressed, Hopeless 0 0 0  PHQ - 2 Score 0 0 0  Altered sleeping  1 0  Tired, decreased energy  2 1  Change in appetite  0 0  Feeling bad or failure about yourself   0 0  Trouble concentrating  0 2  Moving slowly or fidgety/restless  0 0  Suicidal thoughts  0 0  PHQ-9 Score  3  3   Difficult doing work/chores  Not difficult at all Somewhat difficult     Data saved with a previous flowsheet row definition      04/23/2024   11:13 AM 01/22/2024   10:09 AM 12/21/2023    1:30 PM  GAD 7 : Generalized Anxiety Score  Nervous, Anxious, on Edge 0 0 0  Control/stop worrying 0 0 0  Worry too much - different things 0 0 0  Trouble relaxing 0 0 0  Restless 0 0 0  Easily annoyed or irritable 1 2 0  Afraid - awful might happen 0 0 0  Total GAD 7 Score 1 2 0  Anxiety Difficulty Not difficult at all Somewhat difficult Not difficult at all    Medications: Outpatient Medications Prior to Visit  Medication Sig   acetaminophen  (TYLENOL ) 500 MG tablet Take 1,000 mg by mouth 3 (three) times daily. Take 1000 mg by mouth in the morning. Take 500 mg by  mouth in the afternoon and at bedtime.   albuterol  (VENTOLIN  HFA) 108 (90 Base) MCG/ACT inhaler Inhale 1-2 puffs into the lungs every 6 (six) hours as needed for wheezing or shortness of breath.   aspirin  EC 81 MG tablet Take 81 mg by mouth daily. Swallow whole.   atorvastatin  (LIPITOR) 10 MG tablet TAKE 1 TABLET(10 MG) BY MOUTH DAILY   furosemide  (LASIX ) 40 MG tablet Take 1 tablet (40 mg total) by mouth daily as needed (for shortness of breath or swelling).   gabapentin  (NEURONTIN ) 300 MG capsule TAKE 1 CAPSULE(300 MG) BY MOUTH TWICE DAILY   glucose blood (RIGHTEST GS550 BLOOD GLUCOSE) test strip USE AS DIRECTED TO CHECK BLOOD SUGAR UP TO 4 TIMES DAILY.   ketotifen  (ZADITOR ) 0.025 % ophthalmic solution Place 1 drop into both eyes daily as needed  (allergies).   loratadine  (CLARITIN ) 10 MG tablet Take 10 mg by mouth daily as needed for allergies.   metFORMIN  (GLUCOPHAGE ) 1000 MG tablet TAKE 1 TABLET(1000 MG) BY MOUTH TWICE DAILY WITH A MEAL   potassium chloride  (KLOR-CON ) 10 MEQ tablet Take 1 tablet (10 mEq total) by mouth daily as needed (take with lasix ).   No facility-administered medications prior to visit.    Review of Systems All negative Except see HPI       Objective    BP 135/81   Pulse 78   Resp 14   Ht 5' 10 (1.778 m)   Wt 254 lb (115.2 kg)   SpO2 99%   BMI 36.45 kg/m     Physical Exam Vitals reviewed.  Constitutional:      General: He is not in acute distress.    Appearance: Normal appearance. He is not diaphoretic.  HENT:     Head: Normocephalic and atraumatic.  Eyes:     General: No scleral icterus.    Conjunctiva/sclera: Conjunctivae normal.  Cardiovascular:     Rate and Rhythm: Normal rate and regular rhythm.     Pulses: Normal pulses.     Heart sounds: Normal heart sounds. No murmur heard. Pulmonary:     Effort: Pulmonary effort is normal. No respiratory distress.     Breath sounds: Normal breath sounds. No wheezing or rhonchi.  Musculoskeletal:     Cervical back: Neck supple.     Right lower leg: No edema.     Left lower leg: No edema.  Lymphadenopathy:     Cervical: No cervical adenopathy.  Skin:    General: Skin is warm and dry.     Findings: No rash.  Neurological:     Mental Status: He is alert and oriented to person, place, and time. Mental status is at baseline.  Psychiatric:        Mood and Affect: Mood normal.        Behavior: Behavior normal.      No results found for any visits on 07/25/24.      Assessment & Plan Encounter for immunization Discussed immunization options including shingles, pneumonia, COVID, and tetanus vaccines. He declined tetanus vaccine, believing he received it three years ago. Agreed to receive flu vaccine today. - Administered flu vaccine  today. - Discussed shingles, pneumonia, and COVID vaccines.  Type 2 diabetes mellitus Chronic and stable A1c is 6.0, indicating good control. Blood sugar is slightly elevated. Emphasized importance of daily foot care due to risk of non-healing wounds and potential complications. - Rechecked A1c. - Monitor blood sugar levels. Pt admits noncompliance - Educated on daily foot care to prevent  complications. Continue metformin  2000mg  daily Will follow-up  Obesity, class 1 Chronic and stable Associated with dmii, htn, hld Weight is fluctuating with a tendency to increase. Discussed importance of weight management through diet and exercise to alleviate musculoskeletal pain and improve overall health. - Encouraged weight loss through diet and exercise. Will follow-up  Umbilical hernia, planned for surgical repair Surgery scheduled for January. Cardiology clearance required prior to surgery due to cardiac history. - Ensure cardiology clearance before surgery.  Chronic musculoskeletal pain, status post hip surgery Pain exacerbated by weather changes and weight gain. Discussed benefits of exercise, diet, and use of Voltaren gel for pain management. - Encouraged regular exercise and weight management. - Recommended use of Voltaren gel for pain relief. Will follow-up  Visual problems/Worsening visual impairment Reports worsening vision since last eye exam less than a year ago. Advised to seek medical evaluation due to diabetes-related risk factors. - Advised to contact eye center for evaluation due to worsening vision and diabetes. Will follow-up  Immunization due (Primary) - Flu vaccine trivalent PF, 6mos and older(Flulaval,Afluria,Fluarix,Fluzone)  Type 2 diabetes mellitus without complication, with long-term current use of insulin  (HCC) - Urine Microalbumin w/creat. ratio - Lipid panel - Hemoglobin A1c - Basic metabolic panel with GFR - CBC with Differential/Platelet  Hyperlipidemia  associated with type 2 diabetes mellitus (HCC) Chronic and stable Continue taking atorvastatin  10mg  - Urine Microalbumin w/creat. ratio - Lipid panel - Hemoglobin A1c - Basic metabolic panel with GFR - CBC with Differential/Platelet Continue low cholesterol diet and daily exercise  Will follow-up  Hypertension associated with diabetes (HCC) Chronic and stable Continue taking fusodemide 40mg ?  In the setting of CAD, DMII, cerebrovascular disease Workup ordered - Urine Microalbumin w/creat. ratio - Lipid panel - Hemoglobin A1c - Basic metabolic panel with GFR - CBC with Differential/Platelet Will reassess after  receiving lab results Will FU  Obesity (BMI 30.0-34.9)  - Urine Microalbumin w/creat. ratio - Lipid panel - Hemoglobin A1c - Basic metabolic panel with GFR - CBC with Differential/Platelet  Orders Placed This Encounter  Procedures   Flu vaccine trivalent PF, 6mos and older(Flulaval,Afluria,Fluarix,Fluzone)    No follow-ups on file.   The patient was advised to call back or seek an in-person evaluation if the symptoms worsen or if the condition fails to improve as anticipated.  I discussed the assessment and treatment plan with the patient. The patient was provided an opportunity to ask questions and all were answered. The patient agreed with the plan and demonstrated an understanding of the instructions.  I, Othel Hoogendoorn, PA-C have reviewed all documentation for this visit. The documentation on 07/25/2024  for the exam, diagnosis, procedures, and orders are all accurate and complete.  Jolynn Spencer, [facility], MMS [facility] [phone] (phone) [phone] (fax)  [facility]

## 2024-07-26 LAB — CBC WITH DIFFERENTIAL/PLATELET
Basophils Absolute: 0.1 x10E3/uL (ref 0.0–0.2)
Basos: 1 %
EOS (ABSOLUTE): 0.5 x10E3/uL — ABNORMAL HIGH (ref 0.0–0.4)
Eos: 7 %
Hematocrit: 44.8 % (ref 37.5–51.0)
Hemoglobin: 15.3 g/dL (ref 13.0–17.7)
Immature Grans (Abs): 0 x10E3/uL (ref 0.0–0.1)
Immature Granulocytes: 0 %
Lymphocytes Absolute: 1.6 x10E3/uL (ref 0.7–3.1)
Lymphs: 21 %
MCH: 33.4 pg — ABNORMAL HIGH (ref 26.6–33.0)
MCHC: 34.2 g/dL (ref 31.5–35.7)
MCV: 98 fL — ABNORMAL HIGH (ref 79–97)
Monocytes Absolute: 0.6 x10E3/uL (ref 0.1–0.9)
Monocytes: 8 %
Neutrophils Absolute: 4.8 x10E3/uL (ref 1.4–7.0)
Neutrophils: 62 %
Platelets: 141 x10E3/uL — ABNORMAL LOW (ref 150–450)
RBC: 4.58 x10E6/uL (ref 4.14–5.80)
RDW: 13.2 % (ref 11.6–15.4)
WBC: 7.6 x10E3/uL (ref 3.4–10.8)

## 2024-07-26 LAB — LIPID PANEL
Chol/HDL Ratio: 3.2 ratio (ref 0.0–5.0)
Cholesterol, Total: 157 mg/dL (ref 100–199)
HDL: 49 mg/dL (ref >39)
LDL Chol Calc (NIH): 71 mg/dL (ref 0–99)
Triglycerides: 226 mg/dL — ABNORMAL HIGH (ref 0–149)
VLDL Cholesterol Cal: 37 mg/dL (ref 5–40)

## 2024-07-26 LAB — BASIC METABOLIC PANEL WITH GFR
BUN/Creatinine Ratio: 12 (ref 10–24)
BUN: 10 mg/dL (ref 8–27)
CO2: 23 mmol/L (ref 20–29)
Calcium: 10 mg/dL (ref 8.6–10.2)
Chloride: 100 mmol/L (ref 96–106)
Creatinine, Ser: 0.82 mg/dL (ref 0.76–1.27)
Glucose: 103 mg/dL — ABNORMAL HIGH (ref 70–99)
Potassium: 4.7 mmol/L (ref 3.5–5.2)
Sodium: 138 mmol/L (ref 134–144)
eGFR: 100 mL/min/1.73 (ref >59)

## 2024-07-26 LAB — MICROALBUMIN / CREATININE URINE RATIO
Creatinine, Urine: 103.5 mg/dL
Microalb/Creat Ratio: 11 mg/g{creat} (ref 0–29)
Microalbumin, Urine: 11.6 ug/mL

## 2024-07-26 LAB — HEMOGLOBIN A1C
Est. average glucose Bld gHb Est-mCnc: 117 mg/dL
Hgb A1c MFr Bld: 5.7 % — ABNORMAL HIGH (ref 4.8–5.6)

## 2024-07-27 ENCOUNTER — Ambulatory Visit: Payer: Self-pay | Admitting: Physician Assistant

## 2024-08-06 ENCOUNTER — Encounter: Payer: Self-pay | Admitting: General Surgery

## 2024-08-06 ENCOUNTER — Telehealth: Payer: Self-pay

## 2024-08-06 ENCOUNTER — Telehealth: Payer: Self-pay | Admitting: General Surgery

## 2024-08-06 ENCOUNTER — Telehealth (HOSPITAL_BASED_OUTPATIENT_CLINIC_OR_DEPARTMENT_OTHER): Payer: Self-pay

## 2024-08-06 ENCOUNTER — Ambulatory Visit (INDEPENDENT_AMBULATORY_CARE_PROVIDER_SITE_OTHER): Admitting: General Surgery

## 2024-08-06 VITALS — BP 153/81 | HR 91 | Temp 98.2°F | Ht 70.0 in | Wt 255.8 lb

## 2024-08-06 DIAGNOSIS — K429 Umbilical hernia without obstruction or gangrene: Secondary | ICD-10-CM

## 2024-08-06 NOTE — Telephone Encounter (Signed)
   Pre-operative Risk Assessment    Patient Name: Joshua Cole  DOB: 1961/01/01 MRN: [id number]   Date of last office visit: 02/07/2024 with Bernardino Bring, PA-C Date of next office visit: 08/16/2024 with Bernardino Bring, PA-C  Request for Surgical Clearance    Procedure:  Open Umbilical Hernia   Date of Surgery:  Clearance 09/16/24                                 Surgeon:  Dr. [name]  Surgeon's Group or Practice Name:  [facility]  Phone number:  [phone] Fax number:  [phone]   Type of Clearance Requested:   - Medical  - Pharmacy:  [facility]  -Needs instruction   Type of Anesthesia:  General    Additional requests/questions:  None  SignedPatrcia Iverson CROME   08/06/2024, 4:55 PM

## 2024-08-06 NOTE — Patient Instructions (Signed)
 You have chose to have your hernia repaired. This will be done by Dr. Cornel Diesel at [facility].  If you are on any injectable weight loss medication, you will need to stop taking your GLP-1 injectable (weight loss) medications 8 days before your surgery to avoid any complications with anesthesia.   Please see your (blue) Pre-care information that you have been given today. Our surgery scheduler will call you to verify surgery date and to go over information.   You will need to arrange to be out of work for approximately 1-2 weeks and then you may return with a lifting restriction for 4 more weeks. If you have FMLA or Disability paperwork that needs to be filled out, please have your company fax your paperwork to [phone] or you may drop this by either office. This paperwork will be filled out within 3 days after your surgery has been completed.  You may have a bruise in your groin and also swelling and brusing in your testicle area. You may use ice 4-5 times daily for 15-20 minutes each time. Make sure that you place a barrier between you and the ice pack. To decrease the swelling, you may roll up a bath towel and place it vertically in between your thighs with your testicles resting on the towel. You will want to keep this area elevated as much as possible for several days following surgery.    Inguinal Hernia, Adult Muscles help keep everything in the body in its proper place. But if a weak spot in the muscles develops, something can poke through. That is called a hernia. When this happens in the lower part of the belly (abdomen), it is called an inguinal hernia. (It takes its name from a part of the body in this region called the inguinal canal.) A weak spot in the wall of muscles lets some fat or part of the small intestine bulge through. An inguinal hernia can develop at any age. Men get them more often than women. CAUSES  In adults, an inguinal hernia develops over time. It can be  triggered by: Suddenly straining the muscles of the lower abdomen. Lifting heavy objects. Straining to have a bowel movement. Difficult bowel movements (constipation) can lead to this. Constant coughing. This may be caused by smoking or lung disease. Being overweight. Being pregnant. Working at a job that requires long periods of standing or heavy lifting. Having had an inguinal hernia before. One type can be an emergency situation. It is called a strangulated inguinal hernia. It develops if part of the small intestine slips through the weak spot and cannot get back into the abdomen. The blood supply can be cut off. If that happens, part of the intestine may die. This situation requires emergency surgery. SYMPTOMS  Often, a small inguinal hernia has no symptoms. It is found when a healthcare provider does a physical exam. Larger hernias usually have symptoms.  In adults, symptoms may include: A lump in the groin. This is easier to see when the person is standing. It might disappear when lying down. In men, a lump in the scrotum. Pain or burning in the groin. This occurs especially when lifting, straining or coughing. A dull ache or feeling of pressure in the groin. Signs of a strangulated hernia can include: A bulge in the groin that becomes very painful and tender to the touch. A bulge that turns red or purple. Fever, nausea and vomiting. Inability to have a bowel movement or to pass gas.  DIAGNOSIS  To decide if you have an inguinal hernia, a healthcare provider will probably do a physical examination. This will include asking questions about any symptoms you have noticed. The healthcare provider might feel the groin area and ask you to cough. If an inguinal hernia is felt, the healthcare provider may try to slide it back into the abdomen. Usually no other tests are needed. TREATMENT  Treatments can vary. The size of the hernia makes a difference. Options include: Watchful waiting. This  is often suggested if the hernia is small and you have had no symptoms. No medical procedure will be done unless symptoms develop. You will need to watch closely for symptoms. If any occur, contact your healthcare provider right away. Surgery. This is used if the hernia is larger or you have symptoms. Open surgery. This is usually an outpatient procedure (you will not stay overnight in a hospital). An cut (incision) is made through the skin in the groin. The hernia is put back inside the abdomen. The weak area in the muscles is then repaired by herniorrhaphy or hernioplasty. Herniorrhaphy: in this type of surgery, the weak muscles are sewn back together. Hernioplasty: a patch or mesh is used to close the weak area in the abdominal wall. Laparoscopy. In this procedure, a surgeon makes small incisions. A thin tube with a tiny video camera (called a laparoscope) is put into the abdomen. The surgeon repairs the hernia with mesh by looking with the video camera and using two long instruments. HOME CARE INSTRUCTIONS  After surgery to repair an inguinal hernia: You will need to take pain medicine prescribed by your healthcare provider. Follow all directions carefully. You will need to take care of the wound from the incision. Your activity will be restricted for awhile. This will probably include no heavy lifting for several weeks. You also should not do anything too active for a few weeks. When you can return to work will depend on the type of job that you have. During "watchful waiting" periods, you should: Maintain a healthy weight. Eat a diet high in fiber (fruits, vegetables and whole grains). Drink plenty of fluids to avoid constipation. This means drinking enough water and other liquids to keep your urine clear or pale yellow. Do not lift heavy objects. Do not stand for long periods of time. Quit smoking. This should keep you from developing a frequent cough. SEEK MEDICAL CARE IF:  A bulge  develops in your groin area. You feel pain, a burning sensation or pressure in the groin. This might be worse if you are lifting or straining. You develop a fever of more than 100.5 F (38.1 C). SEEK IMMEDIATE MEDICAL CARE IF:  Pain in the groin increases suddenly. A bulge in the groin gets bigger suddenly and does not go down. For men, there is sudden pain in the scrotum. Or, the size of the scrotum increases. A bulge in the groin area becomes red or purple and is painful to touch. You have nausea or vomiting that does not go away. You feel your heart beating much faster than normal. You cannot have a bowel movement or pass gas. You develop a fever of more than 102.0 F (38.9 C).   This information is not intended to replace advice given to you by your health care provider. Make sure you discuss any questions you have with your health care provider.   Document Released: 01/15/2009 Document Revised: 11/21/2011 Document Reviewed: 03/02/2015 Elsevier Interactive Patient Education [employer]

## 2024-08-06 NOTE — Telephone Encounter (Signed)
 Faxed cardiac clearance to [employer] PA-C at [phone].

## 2024-08-06 NOTE — Telephone Encounter (Signed)
 Patient has been advised of Pre-Admission date/time, and Surgery date at [facility].  Surgery Date: 09/16/24 Preadmission Testing Date: Preadmissions to call patient for appointment.    Patient informed of the scheduling process and surgery information given at time of office visit.   Patient has been made aware to call [phone], between 1-3:00pm the day before surgery, to find out what time to arrive for surgery.

## 2024-08-07 NOTE — Progress Notes (Signed)
 Outpatient Surgical Follow Up   Joshua Cole is an 63 y.o. male.   Chief Complaint  Patient presents with   Follow-up    Umbilical hernia    HPI: The patient returns today to discuss his umbilical hernia.  He saw me back in the summer but at that time it was not a good time for him surgery.  He reports that he has continued to do well.  He will intermittently have pain at his umbilicus.  He denies any obstructive symptoms or drainage from the area.  He denies any overlying skin changes.  He continues to take aspirin .  He has a appointment with his cardiologist on 12 December  Past Medical History:  Diagnosis Date   Arthritis    Asthma    Chronic kidney disease    Diabetes mellitus without complication (HCC)    type 2   DJD (degenerative joint disease)    Dysrhythmia    junctional tachycardia and incomplete heart block   Edema of left lower extremity 07/04/2022   Gout    Heart block    Lung nodules    a. 09/2021 CT chest: Interval decrease in size and number of bilateral lung nodules, likely consistent with sequelae associated with an infectious/inflammatory process.   MSSA bacteremia 06/2021   Rotator cuff tear 08/26/2021   Subacute bacterial endocarditis (SBE)    a. 06/2021 TEE: mobile mass attached to the tricuspid valve-->Abx rx-->09/2021 TEE: EF 55-60%, no rwma, nl RV fxn, mild-mod RAE, mild MR, mobile echodense 11x11mm mass in the TV apparatus-->conservative rx per TCTS.    Past Surgical History:  Procedure Laterality Date   ACHILLES TENDON REPAIR Left    many years ago per pt   ANTERIOR HIP REVISION Left 10/06/2022   Procedure: REMOVAL OF LEFT HIP ANTIBIOTIC SPACER, LEFT TOTAL HIP REVISION ARTHROPLASTY;  Surgeon: Vernetta Lonni GRADE, MD;  Location: [facility] ORS;  Service: Orthopedics;  Laterality: Left;   BIOPSY  07/16/2021   Procedure: BIOPSY;  Surgeon: Federico Rosario BROCKS, MD;  Location: [facility] ENDOSCOPY;  Service: Gastroenterology;;  EGD and COLON   COLONOSCOPY N/A 07/16/2021    Procedure: COLONOSCOPY;  Surgeon: Federico Rosario BROCKS, MD;  Location: [facility] ENDOSCOPY;  Service: Gastroenterology;  Laterality: N/A;   COLONOSCOPY WITH PROPOFOL  N/A 07/16/2021   Procedure: COLONOSCOPY WITH PROPOFOL ;  Surgeon: Federico Rosario BROCKS, MD;  Location: [facility] ENDOSCOPY;  Service: Gastroenterology;  Laterality: N/A;   ESOPHAGOGASTRODUODENOSCOPY (EGD) WITH PROPOFOL  N/A 07/16/2021   Procedure: ESOPHAGOGASTRODUODENOSCOPY (EGD) WITH PROPOFOL ;  Surgeon: Federico Rosario BROCKS, MD;  Location: [facility] ENDOSCOPY;  Service: Gastroenterology;  Laterality: N/A;   EXCISIONAL TOTAL HIP ARTHROPLASTY WITH ANTIBIOTIC SPACERS Left 05/20/2022   Procedure: EXCISIONAL LEFT TOTAL HIP ARTHROPLASTY WITH ANTIBIOTIC SPACERS;  Surgeon: Vernetta Lonni GRADE, MD;  Location: [facility] ORS;  Service: Orthopedics;  Laterality: Left;   FOOT SURGERY Right    ligaments repaired- many years ago per pt   HERNIA REPAIR     JOINT REPLACEMENT Bilateral    hip   POLYPECTOMY  07/16/2021   Procedure: POLYPECTOMY;  Surgeon: Federico Rosario BROCKS, MD;  Location: [facility] ENDOSCOPY;  Service: Gastroenterology;;   SHOULDER ARTHROSCOPY WITH ROTATOR CUFF REPAIR AND SUBACROMIAL DECOMPRESSION Left 02/10/2022   Procedure: LEFT SHOULDER ARTHROSCOPY WITH EXTENSIVE DEBRIDEMENT, SUBACROMIAL DECOMPRESSION;  Surgeon: Vernetta Lonni GRADE, MD;  Location: [facility] OR;  Service: Orthopedics;  Laterality: Left;   TEE WITHOUT CARDIOVERSION N/A 07/07/2021   Procedure: TRANSESOPHAGEAL ECHOCARDIOGRAM (TEE);  Surgeon: Darliss Rogue, MD;  Location: [facility] ORS;  Service: Cardiovascular;  Laterality:  N/A;    Family History  Problem Relation Age of Onset   Other Mother        unknown medical history   Lung cancer Father    Psoriasis Sister    Breast cancer Sister    Hypertension Brother    Hyperlipidemia Brother    Diabetes Brother    Heart attack Brother 61    Social History:  reports that he quit smoking about 18 years ago. His smoking use included cigarettes. He started smoking about 33  years ago. He has a 3.8 pack-year smoking history. He has never used smokeless tobacco. He reports that he does not currently use alcohol  after a past usage of about 20.0 standard drinks of alcohol  per week. He reports that he does not currently use drugs.  Allergies: No Known Allergies  Medications reviewed.    ROS Full ROS performed and is otherwise negative other than what is stated in HPI   BP (!) 153/81   Pulse 91   Temp 98.2 F (36.8 C) (Oral)   Ht 5' 10 (1.778 m)   Wt 255 lb 12.8 oz (116 kg)   SpO2 96%   BMI 36.70 kg/m   Physical Exam Abdomen soft, protuberant, nonreducible umbilical hernia containing fat with mild discoloration over it and some pain with attempt at reduction.    No results found for this or any previous visit (from the past 48 hours). No results found.  Assessment/Plan:  Patient with umbilical hernia.  He is not ready to have it repaired.  I discussed with him that we will do this open plus or minus the use of mesh.  He will have cardiac restratification done when he sees his cardiologist in several weeks.  I discussed the risk, benefits alternatives of the procedure including risk of infection, bleeding damage to underlying structures and recurrence of the hernia.  He understands these risk and wishes to proceed with surgery.  If possible we will hold his aspirin  for 6 days prior to the procedure but if not able to okay to continue in the perioperative period   [name], M.D. [facility]

## 2024-08-07 NOTE — Telephone Encounter (Signed)
   Name: Joshua Cole  DOB: [birth year]  MRN: [id number]  Primary Cardiologist: Deatrice Cage, MD  Chart reviewed as part of pre-operative protocol coverage. The patient has an upcoming visit scheduled with Bernardino Bring, PA on 08/16/24 at which time clearance can be addressed in case there are any issues that would impact surgical recommendations.  Open umbilical hernia repair is not scheduled until 09/16/24 as below. I added preop FYI to appointment note so that provider is aware to address at time of outpatient visit.  Per office protocol the cardiology provider should forward their finalized clearance decision and recommendations regarding antiplatelet therapy to the requesting party below.    I will route this message as FYI to requesting party and remove this message from the preop box as separate preop APP input not needed at this time.   Please call with any questions.  Krissie Merrick D Sherel Fennell, NP  08/07/2024, 7:10 AM

## 2024-08-13 ENCOUNTER — Ambulatory Visit: Admitting: Cardiovascular Disease

## 2024-08-15 NOTE — Progress Notes (Unsigned)
 Cardiology Office Note    Date:  08/16/2024   ID:  Joshua Cole, DOB [birth year], MRN [id number]  PCP:  Ostwalt, Janna, PA-C  Cardiologist:  Deatrice Cage, MD  Electrophysiologist:  OLE ONEIDA HOLTS, MD   Chief Complaint: Preop risk stratification   History of Present Illness:   Joshua Cole is a 63 y.o. 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   He was admitted in 06/2021 with weakness and hip pain.  He was found to have MSSA bacteremia.  CTA of the chest was negative for PE.  In the setting of bacteremia, TEE showed a tricuspid valve vegetation.  He developed complete heart block with junctional escape and did not require temporary transvenous pacing.  He was transferred to [facility] and seen by CT surgery with recommendation for ongoing antibiotic therapy and outpatient follow-up.  Hospital course was complicated by anemia requiring 2 units of packed red blood cells.  He followed up with ID in the outpatient setting.  Following completion of antibiotics, repeat blood cultures were drawn and negative.  In follow-up with EP in 08/2021, he was in an accelerated junctional rhythm with recommendation for conservative therapy.  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  He was  reevaluated by CT surgery in 10/2021, and based on the location of the mass, it was not felt to be a good candidate for angio VAC debridement.  There was no strong indication for open debridement and it was recommended to continue conservative management.  He was seen in the office in 11/2021 noting chronic dyspnea, and for preoperative cardiac risk stratification.  To further risk stratify, he underwent Lexiscan  MPI in 11/2021 that showed no evidence of ischemia or infarction and was overall low risk.  CT attenuation corrected images showed minimal coronary calcification and moderate aortic atherosclerosis.  He underwent shoulder arthroscopy in 02/2022 without cardiac complication.  He was seen by EP in 02/2022 with EKG showing sinus rhythm and narrow QRS with a prolonged PR interval.  Continued monitoring and conservative therapy was recommended with no indication for permanent pacing.  He has since undergone excisional left total hip arthroplasty with antibiotic spacer placement in 05/2022 followed by removal of antibiotic spacer and left total hip revision arthroplasty in 09/2022.  Deep wound cultures showed no growth.   He was seen in the office on 03/07/2023, reporting an increase in lower extremity swelling and was taking furosemide  20 mg daily.  He was without chest pain, dyspnea, orthopnea, or abdominal distention.  His weight was up 9 pounds when compared to his clinic visit in 02/2022.  It was recommended he increase furosemide  to 40 mg daily.  Due to a slightly uptrending BUN/serum creatinine, furosemide  was subsequently reduced back to 20 mg daily.  Echo on 03/21/2023 showed an EF of 60-65%, no regional wall motion abnormalities, low normal RV systolic function mildly enlarged RV cavity size,  moderate biatrial enlargement, mild to moderate mitral regurgitation, moderate to severe tricuspid regurgitation, aortic valve sclerosis without evidence of stenosis, and an estimated right atrial pressure of 15 mmHg.  Echo  results were reviewed with his primary cardiologist with recommendation to increase furosemide  back to 40 mg daily and pursue a repeat limited echo in 3 to 4 months to reevaluate tricuspid regurgitation.  He was seen in the office in 04/2023 and reported no significant dyspnea, or evidence of volume overload.  He was continued on furosemide  40 mg daily with follow-up labs showing stable renal function.  Limited echo on 06/27/2023 showed an EF of 60 to 65%, no regional wall motion abnormalities, normal RV systolic function with moderately enlarged ventricular cavity size, mild mitral regurgitation, poorly visualized tricuspid valve with moderate to severe regurgitation (though difficult to estimate), and an estimated right atrial pressure of 8 mmHg.      In the setting of his significant tricuspid regurgitation, he was referred to [facility].  Surface echo in 07/2023 showed an EF greater than 55%, torrential tricuspid regurgitation, severely dilated right atrium, mildly dilated left atrium, severely dilated right ventricle with mildly reduced RV systolic function, and mildly thickened aortic valve leaflets with normal excursion.  TEE at that time showed normal LV systolic function, mitral regurgitation, mildly dilated left atrium, thickened aortic valve leaflets with normal excursion, severely dilated RV with moderately reduced systolic function, poorly visualized tricuspid valve leaflets with the anterior and posterior leaflets appearing diminutive with torrential tricuspid regurgitation, and severely dilated right atrium.  Coronary CTA in 07/2023 showed severely dilated RV with mildly reduced RV systolic function with calcified plaque noted in the proximal and mid LAD lies in the distal RCA at the takeoff of the PDA.  CT FFR with hemodynamically significant stenosis in OM1 with a value of 0.61 and RCA with a value of 0.62 (study performed without nitroglycerin ).  Carotid artery ultrasound in 08/2023 showed 50 to 69%  right ICA stenosis significant stenosis in the right CCA, and less than 50% left ICA stenosis.  LHC on 09/28/2023 showed large and normal left main, large LAD with no CAD, 30% proximal LCx stenosis mild OM1 disease, and a large dominant RCA with large RPDA with ostial 40% stenosis.     He underwent successful tricuspid valve replacement and PFO closure at [facility] on 10/20/2023.  Post op echo showed an EF greater than 55% with no regurgitation of prosthetic tricuspid valve and no evidence of intra-atrial shunting by bubble study.  Postoperative course was notable for intermittent sinus tachycardia accompanied by high-grade AV block with accelerated junctional rhythm.  He was evaluated by EP with initial concern for A-fib component however rhythm was deemed to be consistent with heart block with accelerated junctional rhythm.  All AV nodal blocking agents were held with EKG thereafter demonstrating sinus rhythm with secondary AV block.  EP did not recommend PPM at that time.  Postoperative course was also notable for AKI felt to be likely secondary to hypovolemia and he was evaluated by nephrology.  There was also concern for possible infection secondary to unknown etiology with worsening leukocytosis and lethargy.  Cultures were unrevealing.  He was treated with empiric antibiotics.  At the time of discharge there were improved with small bilateral pleural effusions.  He had acute postop blood loss anemia and was treated with 2 units PRBC.   He was seen in the office on 01/01/2024 noting a significant improvement in his functional status following replacement of his tricuspid  valve.  Furosemide  was transitioned to as needed dosing.  Echo on 02/01/2024 showed an EF of 60 to 65%, no regional wall motion abnormalities, mild LVH, normal RV systolic function and ventricular cavity size, severely dilated right atrium, moderately dilated right atrium, mild mitral regurgitation, prior replacement of the tricuspid valve with a  mean gradient of 10 mmHg, and an estimated right atrial pressure of 8 mmHg.  He was last seen in the office in 01/2024 and continued to do well from a cardiac perspective.  His weight was up 10 pounds at that time but compared to his prior visit which he attributed to increased appetite and caloric intake.  He has not needed any as needed furosemide .  Subsequent Zio patch in 01/2024 showed a predominant rhythm of sinus with an average rate of 87 bpm with first-degree AV block, idioventricular rhythm, and second-degree AV block type I noted.  He comes in continuing to do well from a cardiac perspective and is without symptoms of angina or cardiac decompensation.  No dizziness, presyncope, or syncope.  No lower extremity swelling or progressive orthopnea.  He has not needed any as needed furosemide  since he was last seen.  Active at baseline, able to do yard work without cardiac limitation.  Does not have any acute cardiac concerns at this time.  [facility] Activity Status Index: > 4 METs Revised Cardiac Risk Index: Low risk for a noncardiac surgery with an estimated rate of 0.9% in the perioperative timeframe    Labs independently reviewed: 07/2024 - Hgb 15., PLT 141, BUN 10, serum creatinine 0.82, potassium 4.7, A1c 5.7, TC 157, TG 226, HDL 49, LDL 71 04/2024 - albumin  4.8, AST/ALT normal 12/2023 - TSH normal  Past Medical History:  Diagnosis Date   Arthritis    Asthma    Chronic kidney disease    Diabetes mellitus without complication (HCC)    type 2   DJD (degenerative joint disease)    Dysrhythmia    junctional tachycardia and incomplete heart block   Edema of left lower extremity 07/04/2022   Gout    Heart block    Lung nodules    a. 09/2021 CT chest: Interval decrease in size and number of bilateral lung nodules, likely consistent with sequelae associated with an infectious/inflammatory process.   MSSA bacteremia 06/2021   Rotator cuff tear 08/26/2021   Subacute bacterial endocarditis (SBE)     a. 06/2021 TEE: mobile mass attached to the tricuspid valve-->Abx rx-->09/2021 TEE: EF 55-60%, no rwma, nl RV fxn, mild-mod RAE, mild MR, mobile echodense 11x32mm mass in the TV apparatus-->conservative rx per TCTS.    Past Surgical History:  Procedure Laterality Date   ACHILLES TENDON REPAIR Left    many years ago per pt   ANTERIOR HIP REVISION Left 10/06/2022   Procedure: REMOVAL OF LEFT HIP ANTIBIOTIC SPACER, LEFT TOTAL HIP REVISION ARTHROPLASTY;  Surgeon: Vernetta Lonni GRADE, MD;  Location: [facility] ORS;  Service: Orthopedics;  Laterality: Left;   BIOPSY  07/16/2021   Procedure: BIOPSY;  Surgeon: Federico Rosario BROCKS, MD;  Location: [facility] ENDOSCOPY;  Service: Gastroenterology;;  EGD and COLON   COLONOSCOPY N/A 07/16/2021   Procedure: COLONOSCOPY;  Surgeon: Federico Rosario BROCKS, MD;  Location: [facility] ENDOSCOPY;  Service: Gastroenterology;  Laterality: N/A;   COLONOSCOPY WITH PROPOFOL  N/A 07/16/2021   Procedure: COLONOSCOPY WITH PROPOFOL ;  Surgeon: Federico Rosario BROCKS, MD;  Location: [facility] ENDOSCOPY;  Service: Gastroenterology;  Laterality: N/A;   ESOPHAGOGASTRODUODENOSCOPY (EGD) WITH PROPOFOL  N/A 07/16/2021   Procedure: ESOPHAGOGASTRODUODENOSCOPY (  EGD) WITH PROPOFOL ;  Surgeon: Federico Rosario BROCKS, MD;  Location: [facility] ENDOSCOPY;  Service: Gastroenterology;  Laterality: N/A;   EXCISIONAL TOTAL HIP ARTHROPLASTY WITH ANTIBIOTIC SPACERS Left 05/20/2022   Procedure: EXCISIONAL LEFT TOTAL HIP ARTHROPLASTY WITH ANTIBIOTIC SPACERS;  Surgeon: Vernetta Lonni GRADE, MD;  Location: [facility] ORS;  Service: Orthopedics;  Laterality: Left;   FOOT SURGERY Right    ligaments repaired- many years ago per pt   HERNIA REPAIR     JOINT REPLACEMENT Bilateral    hip   POLYPECTOMY  07/16/2021   Procedure: POLYPECTOMY;  Surgeon: Federico Rosario BROCKS, MD;  Location: [facility] ENDOSCOPY;  Service: Gastroenterology;;   SHOULDER ARTHROSCOPY WITH ROTATOR CUFF REPAIR AND SUBACROMIAL DECOMPRESSION Left 02/10/2022   Procedure: LEFT SHOULDER ARTHROSCOPY WITH EXTENSIVE  DEBRIDEMENT, SUBACROMIAL DECOMPRESSION;  Surgeon: Vernetta Lonni GRADE, MD;  Location: [facility] OR;  Service: Orthopedics;  Laterality: Left;   TEE WITHOUT CARDIOVERSION N/A 07/07/2021   Procedure: TRANSESOPHAGEAL ECHOCARDIOGRAM (TEE);  Surgeon: Darliss Rogue, MD;  Location: [facility] ORS;  Service: Cardiovascular;  Laterality: N/A;    Current Medications: Current Meds  Medication Sig   acetaminophen  (TYLENOL ) 500 MG tablet Take 1,000 mg by mouth 3 (three) times daily. Take 1000 mg by mouth in the morning. Take 500 mg by mouth in the afternoon and at bedtime.   albuterol  (VENTOLIN  HFA) 108 (90 Base) MCG/ACT inhaler Inhale 1-2 puffs into the lungs every 6 (six) hours as needed for wheezing or shortness of breath.   aspirin  EC 81 MG tablet Take 81 mg by mouth daily. Swallow whole.   atorvastatin  (LIPITOR) 10 MG tablet TAKE 1 TABLET(10 MG) BY MOUTH DAILY   furosemide  (LASIX ) 40 MG tablet Take 1 tablet (40 mg total) by mouth daily as needed (for shortness of breath or swelling).   gabapentin  (NEURONTIN ) 300 MG capsule TAKE 1 CAPSULE(300 MG) BY MOUTH TWICE DAILY   glucose blood (RIGHTEST GS550 BLOOD GLUCOSE) test strip USE AS DIRECTED TO CHECK BLOOD SUGAR UP TO 4 TIMES DAILY.   ketotifen  (ZADITOR ) 0.025 % ophthalmic solution Place 1 drop into both eyes daily as needed (allergies).   loratadine  (CLARITIN ) 10 MG tablet Take 10 mg by mouth daily as needed for allergies.   metFORMIN  (GLUCOPHAGE ) 1000 MG tablet TAKE 1 TABLET(1000 MG) BY MOUTH TWICE DAILY WITH A MEAL   potassium chloride  (KLOR-CON ) 10 MEQ tablet Take 1 tablet (10 mEq total) by mouth daily as needed (take with lasix ).    Allergies:   Patient has no known allergies.   Social History   Socioeconomic History   Marital status: Widowed    Spouse name: Not on file   Number of children: 1   Years of education: Not on file   Highest education level: Associate degree: occupational, scientist, product/process development, or vocational program  Occupational History   Not  on file  Tobacco Use   Smoking status: Former    Current packs/day: 0.00    Average packs/day: 0.3 packs/day for 15.0 years (3.8 ttl pk-yrs)    Types: Cigarettes    Start date: 54    Quit date: 2007    Years since quitting: 18.9   Smokeless tobacco: Never  Vaping Use   Vaping status: Never Used  Substance and Sexual Activity   Alcohol  use: Not Currently    Alcohol /week: 20.0 standard drinks of alcohol     Types: 20 Cans of beer per week    Comment: couple of glasses of wine on the weekends   Drug use: Not Currently   Sexual activity:  Not on file  Other Topics Concern   Not on file  Social History Narrative   Not on file   Social Drivers of Health   Financial Resource Strain: Low Risk  (04/22/2024)   Overall Financial Resource Strain (CARDIA)    Difficulty of Paying Living Expenses: Not hard at all  Food Insecurity: No Food Insecurity (04/22/2024)   Hunger Vital Sign    Worried About Running Out of Food in the Last Year: Never true    Ran Out of Food in the Last Year: Never true  Transportation Needs: No Transportation Needs (04/22/2024)   PRAPARE - Administrator, Civil Service (Medical): No    Lack of Transportation (Non-Medical): No  Physical Activity: Sufficiently Active (04/22/2024)   Exercise Vital Sign    Days of Exercise per Week: 5 days    Minutes of Exercise per Session: 30 min  Stress: No Stress Concern Present (04/22/2024)   [employer] of Occupational Health - Occupational Stress Questionnaire    Feeling of Stress: Not at all  Social Connections: Socially Isolated (04/22/2024)   Social Connection and Isolation Panel    Frequency of Communication with Friends and Family: Once a week    Frequency of Social Gatherings with Friends and Family: Never    Attends Religious Services: Never    Database Administrator or Organizations: No    Attends Engineer, Structural: Not on file    Marital Status: Living with partner     Family History:   The patient's family history includes Breast cancer in his sister; Diabetes in his brother; Heart attack (age of onset: 20) in his brother; Hyperlipidemia in his brother; Hypertension in his brother; Lung cancer in his father; Other in his mother; Psoriasis in his sister.  ROS:   12-point review of systems is negative unless otherwise noted in the HPI.   EKGs/Labs/Other Studies Reviewed:    Studies reviewed were summarized above. The additional studies were reviewed today:  Zio patch 01/2024: Patient had a min HR of 36 bpm, max HR of 145 bpm, and avg HR of 87 bpm. Predominant underlying rhythm was Sinus Rhythm.  First Degree AV Block was present. Idioventricular Rhythm was present. Second Degree AV Block-Mobitz I (Wenckebach) was present.  Rare PACs and rare PVCs. __________  2D echo 02/01/2024: 1. Left ventricular ejection fraction, by estimation, is 60 to 65%. The  left ventricle has normal function. The left ventricle has no regional  wall motion abnormalities. There is mild left ventricular hypertrophy.  Left ventricular diastolic parameters  are indeterminate.   2. Right ventricular systolic function is normal. The right ventricular  size is normal.   3. Left atrial size was severely dilated.   4. Right atrial size was moderately dilated.   5. The mitral valve is normal in structure. Mild mitral valve  regurgitation.   6. Mean tricuspid gradient . . The tricuspid valve is has been  repaired/replaced.   7. The aortic valve is grossly normal. Aortic valve regurgitation is not  visualized.   8. The inferior vena cava is dilated in size with >50% respiratory  variability, suggesting right atrial pressure of 8 mmHg.  __________   Limited echo 10/23/2203 [facility]): Summary   1. Tricuspid valve replacement (33 mm Mitris bioprosthetic valve,  implantation date: 10/20/2023).    2. There is no regurgitation of the prosthetic tricuspid valve.    3. Tricuspid valve Doppler indices  are consistent with normal prosthetic  valve  function.    4. The left ventricle is normal in size with normal wall thickness.    5. The left ventricular systolic function is normal, LVEF is visually  estimated at > 55%.    6. Mitral annular calcification is present (mild).    7. The mitral valve leaflets are mildly thickened with normal leaflet  mobility.   8. The left atrium is mildly dilated in size.    9. The right ventricle is not well visualized but is probably moderately  dilated in size, with mildly reduced systolic function.    10. The right atrium is mildly dilated in size.    11. There is no evidence of an interatrial flow communication or  intrapulmonary shunt by agitated saline study.  __________   TEE 10/20/2023 [facility]): HEMODYNAMICS  Blood Pressure: 115/60  Heart Rate: 80  Inotropes and Infusions: Epi 0.03   LEFT VENTRICLE  Overall appearance: Normal  Estimated systolic function: Normal (EF 49-44%)   Wall Motion Abnormalities  Basal segments-ant Normal  Basal segments-inf: Normal  Basal segments-ant lat: Normal  Basal segments-inf lat: Hypokinetic  Basal segments-ant sept: Normal  Basal segments-inf sept: Normal  Mid segments-ant: Normal  Mid segments-inf: Normal  Mid segments-ant lat: Normal  Mid segments-inf lat: Hypokinetic  Mid segments-ant sept: Normal  Mid segments-inf sept: Normal  Apical segments-ant: Normal  Apical segments-inf: Hypokinetic  Apical segments-sept: Normal  Apical segments-lat: Normal   RIGHT VENTRICLE  Overall appearance: Severely dilated  RV Function: Normal   ATRIAL CHAMBERS  Left atrium: Normal  Left atrial appendage: Normal  Right atrium: Moderately dilated  Interatrial Septum: PFO by contrast    EFFUSIONS  Right pleural effusion: None  Left pleural effusion: None  Pericardial: None   VALVES  Aortic Valve  Aortic valve morphology: Normal trileaflet  Function: Normal  Aortic Stenosis severity: None  Aortic  Insufficiency severity: None   Mitral Valve  Mitral valve morphology: Normal  Function: Normal  Mitral Regurgitation: Mild  Mitral Stenosis: None   Pulmonary Vein Inflow   Tricuspid Valve  Tricuspid Regurgitation: Severe  Details: Unable to visualize leaflets   Pulmonic Valve  Pulmonic valve morphology: Grossly normal  Pulmonary Insufficiency severity: None   AORTA  Ascending aorta: Grade III  Aortic arch: Not well visualized  Descending aorta: Grade III  __________   LHC 10/07/2023 [facility]): CONCLUSIONS:  - Mild coronary artery disease without apparent flow limiting lesions  - No aortic transvalvular gradient  __________   Carotid artery ultrasound 08/29/2023 [facility]): Summary of Findings   1. Right CCA demonstrates significant plaque.   2. Right ICA stenosis 50 - 69%.   3. The Left ICA stenosis less than 50%.  __________   Cardiac CT TAVR 08/02/2023 [facility]): Impressions:  - Severely dilated RV with mildly reduced systolic function  - Calcific, likely non-obstructive, coronary disease as described below    Coronary Anatomy:  Right dominant with normal coronary ostia.  Calcified plaques noted in the proximal and mid LAD, likely non  obstructive  Calcified plaque in the distal RCA at take off of PDA, likely non  obstructive   Valves:  - TV leaflets are not well seen.  - Normal mitral and aortic valves (minimal calcification on aortic valve)      CT FFR:  DETAILED FINDINGS:  1. Left Main - normal FFRct extending to the distal segment with a value  of 0.99  2. [LAD] -  Overall normal, the mid to distal LAD was not evaluated  3. [  LCX] -  there is a hemodynamically significant stenosis in the OM1  vessel with a FFRct value of 0.61  4. [RCA] -  there is a hemodynamically significant stenosis in the RCA  vessel with a FFRct value of 0.62   CONCLUSION:  Cannot rule out obstructive CAD. Note that this study was performed  without nitroglycerine    2D echo 07/31/2023  [facility]): Summary   1. The left ventricle is normal in size with normal wall thickness.    2. The left ventricular systolic function is normal, LVEF is visually  estimated at > 55%.    3. The aortic valve is trileaflet with mildly thickened leaflets with normal  excursion.   4. The left atrium is mildly dilated in size.    5. The right ventricle is severely dilated in size, with mildly reduced  systolic function.    6. There is torrential tricuspid regurgitation.    7. The right atrium is severely dilated in size.    8. Technically difficult study.  __________   TEE 07/31/2023 [facility]): Summary   1. The left ventricular systolic function is normal.    2. The mitral valve leaflets are mildly thickened with normal leaflet  mobility.   3. There is mild mitral valve regurgitation.    4. The aortic valve is trileaflet with mildly thickened leaflets with normal  excursion.   5. The left atrium is mildly dilated in size.    6. The right ventricle is severely dilated in size, with moderately reduced  systolic function.    7. The tricuspid valve leaflets are poorly visualized; the anterior and  posterior leaflets appear diminutive.    8. There is torrential tricuspid regurgitation.    9. The right atrium is severely dilated in size.  __________   Limited echo 06/27/2023: 1. Left ventricular ejection fraction, by estimation, is 60 to 65%. Left  ventricular ejection fraction by PLAX is 56 %. The left ventricle has  normal function. The left ventricle has no regional wall motion  abnormalities.   2. Right ventricular systolic function is normal. The right ventricular  size is moderately enlarged. Tricuspid regurgitation signal is inadequate  for assessing PA pressure.   3. The mitral valve is normal in structure. Mild mitral valve  regurgitation. No evidence of mitral stenosis.   4. The tricuspid valve is not well visualized. Tricuspid valve  regurgitation is moderate to severe, though  difficult to estimate. No  evidence of tricuspid stenosis   5. The aortic valve has an indeterminant number of cusps. Aortic valve  regurgitation is not visualized. No aortic stenosis is present.   6. The inferior vena cava is dilated in size with >50% respiratory  variability, suggesting right atrial pressure of 8 mmHg.  __________   2D echo 03/21/2023: 1. Left ventricular ejection fraction, by estimation, is 60 to 65%. The  left ventricle has normal function. The left ventricle has no regional  wall motion abnormalities. Left ventricular diastolic parameters are  indeterminate. The average left  ventricular global longitudinal strain is -17.6 %.   2. Right ventricular systolic function is low normal. The right  ventricular size is mildly enlarged. Tricuspid regurgitation signal is  inadequate for assessing PA pressure.   3. Left atrial size was moderately dilated.   4. Right atrial size was moderately dilated.   5. The mitral valve is normal in structure. Mild to moderate mitral valve  regurgitation. No evidence of mitral stenosis.   6. Tricuspid valve is  not well visualized. Unable to exclude underlying  valve pathology. Regurgitation is moderate to severe. Consider TEE if  clinically indicated.   7. The aortic valve is tricuspid. Aortic valve regurgitation is not  visualized. Aortic valve sclerosis is present, with no evidence of aortic  valve stenosis.   8. The inferior vena cava is dilated in size with <50% respiratory  variability, suggesting right atrial pressure of 15 mmHg.  __________   Lexiscan  MPI 12/06/2021:   The study is normal. The study is low risk.   No ST deviation was noted.   LV perfusion is normal. There is no evidence of ischemia. There is no evidence of infarction.   Left ventricular function is normal. End diastolic cavity size is normal. End systolic cavity size is normal.   CT attenuation images show evidence of moderate aortic calcifications and minimal  coronary calcifications. __________   2D echo 10/07/2021: 1. Left ventricular ejection fraction, by estimation, is 55 to 60%. The  left ventricle has normal function. The left ventricle has no regional  wall motion abnormalities. Left ventricular diastolic parameters are  indeterminate.   2. Right ventricular systolic function is normal. The right ventricular  size is not well visualized.   3. Right atrial size was mild to moderately dilated.   4. The mitral valve is grossly normal. Mild mitral valve regurgitation.   5. There is a mobile echodense (11 x 7 mm) mass in the tricuspid valve  apparatus (clip 44, 51). Given history of tricuspid mass, clinical  correlation advised.   6. The aortic valve was not well visualized. Aortic valve regurgitation  is not visualized.   7. The inferior vena cava is dilated in size with <50% respiratory  variability, suggesting right atrial pressure of 15 mmHg.  __________   TEE 07/07/2021: 1. Left ventricular ejection fraction, by estimation, is 55 to 60%. The  left ventricle has normal function.   2. Right ventricular systolic function is normal. The right ventricular  size is normal.   3. No left atrial/left atrial appendage thrombus was detected.   4. The mitral valve is normal in structure. Mild mitral valve  regurgitation.   5. There is a mobile mass attached to the tricuspid valve (clip 132)  consistent with a vegetation given the current clinical context.. The  tricuspid valve is degenerative.   6. The aortic valve is tricuspid. Aortic valve regurgitation is not  visualized.  __________   2D echo 07/04/2021: 1. Left ventricular ejection fraction, by estimation, is 55 to 60%. The  left ventricle has normal function. Left ventricular endocardial border  not optimally defined to evaluate regional wall motion. There is mild left  ventricular hypertrophy. Left  ventricular diastolic parameters are indeterminate.   2. Right ventricular  systolic function is normal. The right ventricular  size is normal. Tricuspid regurgitation signal is inadequate for assessing  PA pressure.   3. Left atrial size was mildly dilated.   4. Right atrial size was mildly dilated.   5. The mitral valve is normal in structure. No evidence of mitral valve  regurgitation. No evidence of mitral stenosis.   6. The aortic valve is normal in structure. Aortic valve regurgitation is  not visualized. Mild aortic valve sclerosis is present, with no evidence  of aortic valve stenosis.   7. Aortic dilatation noted. There is borderline dilatation of the aortic  root and of the ascending aorta, measuring 38 mm.   8. The tricuspid was not well visualized. However, there  is likely a  mobile vergetation noted on the short axis images. Recommend a TEE.   EKG:  EKG is ordered today.  The EKG ordered today demonstrates NSR, 85 bpm, first-degree AV block, no acute st/t changes, consistent with prior tracing  Recent Labs: 12/22/2023: TSH 1.540 04/23/2024: ALT 26 07/25/2024: BUN 10; Creatinine, Ser 0.82; Hemoglobin 15.3; Platelets 141; Potassium 4.7; Sodium 138  Recent Lipid Panel    Component Value Date/Time   CHOL 157 07/25/2024 1122   TRIG 226 (H) 07/25/2024 1122   HDL 49 07/25/2024 1122   CHOLHDL 3.2 07/25/2024 1122   CHOLHDL NOT CALCULATED 07/08/2021 0628   VLDL 20 07/08/2021 0628   LDLCALC 71 07/25/2024 1122   LDLDIRECT 65 06/29/2023 1007    PHYSICAL EXAM:    VS:  BP 120/70 (BP Location: Left Arm, Patient Position: Sitting, Cuff Size: Large)   Pulse 85   Ht 5' 10 (1.778 m)   Wt 259 lb (117.5 kg)   SpO2 97%   BMI 37.16 kg/m   BMI: Body mass index is 37.16 kg/m.  Physical Exam Vitals reviewed.  Constitutional:      Appearance: He is well-developed.  HENT:     Head: Normocephalic and atraumatic.  Eyes:     General:        Right eye: No discharge.        Left eye: No discharge.  Cardiovascular:     Rate and Rhythm: Normal rate and  regular rhythm.     Heart sounds: Normal heart sounds, S1 normal and S2 normal. Heart sounds not distant. No midsystolic click and no opening snap. No murmur heard.    No friction rub.  Pulmonary:     Effort: Pulmonary effort is normal. No respiratory distress.     Breath sounds: Normal breath sounds. No decreased breath sounds, wheezing, rhonchi or rales.  Musculoskeletal:     Cervical back: Normal range of motion.     Right lower leg: No edema.     Left lower leg: No edema.  Skin:    General: Skin is warm and dry.     Nails: There is no clubbing.  Neurological:     Mental Status: He is alert and oriented to person, place, and time.  Psychiatric:        Speech: Speech normal.        Behavior: Behavior normal.        Thought Content: Thought content normal.        Judgment: Judgment normal.     Wt Readings from Last 3 Encounters:  08/16/24 259 lb (117.5 kg)  08/06/24 255 lb 12.8 oz (116 kg)  07/25/24 254 lb (115.2 kg)     ASSESSMENT & PLAN:   Preoperative cardiac risk stratification: He is scheduled to undergo umbilical hernia repair on 09/16/2024.  Per [facility] Activity Status Index, he can achieve > 4 METs.  Per Revised Cardiac Risk Index, he is low risk for noncardiac surgery with an estimated rate of 0.9% for adverse cardiac event in the perioperative timeframe.  Given surgery will not involve manipulation of oral mucosa, respiratory tract, or involve infected tissue antibiotic prophylaxis is not recommended by guidelines.  He may proceed with noncardiac surgery at an overall low risk without further cardiac testing.  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  Pretricuspid valve repair cardiac cath showed mild nonobstructive CAD.  LDL 71.  Continue aggressive risk factor modification and primary prevention including aspirin  81 mg and atorvastatin  10 mg.  No indication for further ischemic cardiac testing at this time.   Severe tricuspid regurgitation with history of  endocarditis: Prior symptoms of dyspnea much improved and resolved.  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 Postoperative echo on 10/23/2023 showed preserved LV systolic function with no regurgitation of prosthetic tricuspid valve.  Has completed 3 months of anticoagulation, now on aspirin  81 mg daily.  Echo from 01/2024 showed normal RV function.  SBE prophylaxis indicated for all dental procedures given history of endocarditis in the context of valvular replacement.   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 He redeveloped high-grade AV block following tricuspid valve repair at [facility], also with concern for possible A-fib, and was evaluated by EP with no acute indication for pacemaker implantation.  Repeat outpatient cardiac monitoring showed no evidence of high-grade AV block or A-fib.  Continue to avoid AV nodal blocking medications.  Reviewed again with EP with recommendation to continue to monitor.  Maintain potassium and magnesium  at goal 4.0 and 2.0 respectively.  Recent TSH normal.  Monitor on telemetry in the perioperative timeframe.      Disposition: F/u with Dr. Darron or an APP in 6 months.   Medication Adjustments/Labs and Tests Ordered: Current medicines are reviewed at length with the patient today.  Concerns regarding medicines are outlined above. Medication changes, Labs and Tests ordered today are summarized above and listed in the Patient Instructions accessible in Encounters.   Signed, Bernardino Bring, PA-C 08/16/2024 12:51 PM     [facility] [place] [address] [place], [place] [address] [phone]

## 2024-08-16 ENCOUNTER — Ambulatory Visit: Attending: Physician Assistant | Admitting: Physician Assistant

## 2024-08-16 ENCOUNTER — Encounter: Payer: Self-pay | Admitting: Physician Assistant

## 2024-08-16 VITALS — BP 120/70 | HR 85 | Ht 70.0 in | Wt 259.0 lb

## 2024-08-16 DIAGNOSIS — I440 Atrioventricular block, first degree: Secondary | ICD-10-CM

## 2024-08-16 DIAGNOSIS — Z954 Presence of other heart-valve replacement: Secondary | ICD-10-CM

## 2024-08-16 DIAGNOSIS — Z01810 Encounter for preprocedural cardiovascular examination: Secondary | ICD-10-CM

## 2024-08-16 DIAGNOSIS — I2510 Atherosclerotic heart disease of native coronary artery without angina pectoris: Secondary | ICD-10-CM

## 2024-08-16 DIAGNOSIS — I071 Rheumatic tricuspid insufficiency: Secondary | ICD-10-CM

## 2024-08-16 DIAGNOSIS — I441 Atrioventricular block, second degree: Secondary | ICD-10-CM

## 2024-08-16 DIAGNOSIS — Z8679 Personal history of other diseases of the circulatory system: Secondary | ICD-10-CM

## 2024-08-16 DIAGNOSIS — Z79899 Other long term (current) drug therapy: Secondary | ICD-10-CM

## 2024-08-16 NOTE — Patient Instructions (Signed)
 Medication Instructions:  Your physician recommends that you continue on your current medications as directed. Please refer to the Current Medication list given to you today.   *If you need a refill on your cardiac medications before your next appointment, please call your pharmacy*  Lab Work: Your provider would like for you to have following labs drawn today BMeT and Mag.   If you have labs (blood work) drawn today and your tests are completely normal, you will receive your results only by: MyChart Message (if you have MyChart) OR A paper copy in the mail If you have any lab test that is abnormal or we need to change your treatment, we will call you to review the results.  Follow-Up: At [facility], you and your health needs are our priority.  As part of our continuing mission to provide you with exceptional heart care, our providers are all part of one team.  This team includes your primary Cardiologist (physician) and Advanced Practice Providers or APPs (Physician Assistants and Nurse Practitioners) who all work together to provide you with the care you need, when you need it.  Your next appointment:   6 month(s)  Provider:   You may see Deatrice Cage, MD or Bernardino Bring, PA-C   We recommend signing up for the patient portal called MyChart.  Sign up information is provided on this After Visit Summary.  MyChart is used to connect with patients for Virtual Visits (Telemedicine).  Patients are able to view lab/test results, encounter notes, upcoming appointments, etc.  Non-urgent messages can be sent to your provider as well.   To learn more about what you can do with MyChart, go to [email or web address].

## 2024-08-17 LAB — BASIC METABOLIC PANEL WITH GFR
BUN/Creatinine Ratio: 13 (ref 10–24)
BUN: 12 mg/dL (ref 8–27)
CO2: 18 mmol/L — ABNORMAL LOW (ref 20–29)
Calcium: 10.1 mg/dL (ref 8.6–10.2)
Chloride: 100 mmol/L (ref 96–106)
Creatinine, Ser: 0.95 mg/dL (ref 0.76–1.27)
Glucose: 150 mg/dL — ABNORMAL HIGH (ref 70–99)
Potassium: 4.4 mmol/L (ref 3.5–5.2)
Sodium: 139 mmol/L (ref 134–144)
eGFR: 91 mL/min/1.73 (ref >59)

## 2024-08-17 LAB — MAGNESIUM: Magnesium: 2.3 mg/dL (ref 1.6–2.3)

## 2024-08-18 ENCOUNTER — Ambulatory Visit: Payer: Self-pay | Admitting: Physician Assistant

## 2024-09-05 ENCOUNTER — Ambulatory Visit: Payer: Self-pay | Admitting: General Surgery

## 2024-09-05 DIAGNOSIS — K429 Umbilical hernia without obstruction or gangrene: Secondary | ICD-10-CM

## 2024-09-07 ENCOUNTER — Other Ambulatory Visit: Payer: Self-pay | Admitting: Physician Assistant

## 2024-09-07 DIAGNOSIS — G629 Polyneuropathy, unspecified: Secondary | ICD-10-CM

## 2024-09-09 ENCOUNTER — Other Ambulatory Visit: Payer: Self-pay

## 2024-09-09 ENCOUNTER — Encounter
Admission: RE | Admit: 2024-09-09 | Discharge: 2024-09-09 | Disposition: A | Discharge status: Home / Self Care | Source: Ambulatory Visit | Attending: General Surgery | Admitting: General Surgery

## 2024-09-09 VITALS — Ht 70.0 in | Wt 255.0 lb

## 2024-09-09 DIAGNOSIS — E119 Type 2 diabetes mellitus without complications: Secondary | ICD-10-CM

## 2024-09-09 DIAGNOSIS — K429 Umbilical hernia without obstruction or gangrene: Secondary | ICD-10-CM

## 2024-09-09 DIAGNOSIS — E0821 Diabetes mellitus due to underlying condition with diabetic nephropathy: Secondary | ICD-10-CM

## 2024-09-09 NOTE — Telephone Encounter (Signed)
 I will forward to preop APP as well as provider Bernardino Bring, PAC in regard to ASA hold. I reviewed notes from APP, though I apologize if I missed documentation about ASA hold for procedure.          Not sure why the results were sent to preop if requesting office inquiring if pt has been cleared. I will review the chart for preop clearance.             Tag   Copy Joshua Cole      Telephone Encounter Signed   Creation Time: 09/09/2024 10:58 AM       Signed     [facility]  calling for update. Please advise.

## 2024-09-09 NOTE — Telephone Encounter (Signed)
 [facility]  calling for update. Please advise.

## 2024-09-09 NOTE — Patient Instructions (Addendum)
 Your procedure is scheduled on: Monday 09/16/24 To find out your arrival time, please call [phone] between 1PM - 3PM on: Friday 09/13/24 Report to the Registration Desk on the 1st floor of the Medical Mall. If your arrival time is 6:00 am, do not arrive before that time as the [facility] entrance doors do not open until 6:00 am.  REMEMBER: Instructions that are not followed completely may result in serious medical risk, up to and including death; or upon the discretion of your surgeon and anesthesiologist your surgery may need to be rescheduled.  Do not eat food or drink any liquids after midnight the night before surgery.  No gum chewing or hard candies.  One week prior to surgery: Stop Anti-inflammatories (NSAIDS) such as Advil, Aleve, Ibuprofen, Motrin, Naproxen, Naprosyn and Aspirin  based products such as Excedrin, Goody's Powder, BC Powder.  You may however, continue to take Tylenol  if needed for pain up until the day of surgery.  Stop ANY OVER THE COUNTER supplements and vitamins for at least 7 days until after surgery.  Stop metFORMIN  (GLUCOPHAGE ) 1000 MG tablet 2 days before your procedure (take last dose Friday 09/13/24)  Continue taking all of your other prescription medications up until the day of surgery.  ON THE DAY OF SURGERY ONLY TAKE THESE MEDICATIONS WITH SIPS OF WATER :  gabapentin  (NEURONTIN ) 300 MG capsule   Use inhalers on the day of surgery and bring to the hospital. albuterol  (VENTOLIN  HFA) 108 (90 Base) MCG/ACT inhaler   No Alcohol  for 24 hours before or after surgery.  No Smoking including e-cigarettes for 24 hours before surgery.  No chewable tobacco products for at least 6 hours before surgery.  No nicotine patches on the day of surgery.  Do not use any recreational drugs for at least a week (preferably 2 weeks) before your surgery.  Please be advised that the combination of cocaine and anesthesia may have negative outcomes, up to and including  death. If you test positive for cocaine, your surgery will be cancelled.  On the morning of surgery brush your teeth with toothpaste and water , you may rinse your mouth with mouthwash if you wish. Do not swallow any toothpaste or mouthwash.  Use CHG Soap or wipes as directed on instruction sheet. (You can pick this up at our office in the [facility], the building to the left of the [employer], [address] at [address]  Do not shave body hair from the neck down 48 hours before surgery.  Do not wear lotions, powders, or perfumes.   Wear comfortable clothing (specific to your surgery type) to the hospital.  Do not wear jewelry, make-up, hairpins, clips or nail polish.  For welded (permanent) jewelry: bracelets, anklets, waist bands, etc.  Please have this removed prior to surgery.  If it is not removed, there is a chance that hospital personnel will need to cut it off on the day of surgery.  Contact lenses, hearing aids and dentures may not be worn into surgery.  Do not bring valuables to the hospital. [facility] is not responsible for any missing/lost belongings or valuables.   Notify your doctor if there is any change in your medical condition (cold, fever, infection).  After surgery, you can help prevent lung complications by doing breathing exercises.  Take deep breaths and cough every 1-2 hours. Your doctor may order a device called an Incentive Spirometer to help you take deep breaths.  When coughing or sneezing,  hold a pillow firmly against your incision with both hands. This is called splinting. Doing this helps protect your incision. It also decreases belly discomfort.  If you are being admitted to the hospital overnight, leave your suitcase in the car. After surgery it may be brought to your room.  In case of increased patient census, it may be necessary for you, the patient, to continue your postoperative care in the Same Day Surgery  department.  If you are being discharged the day of surgery, you will not be allowed to drive home. You will need a responsible individual to drive you home and stay with you for 24 hours after surgery.   If you are taking public transportation, you will need to have a responsible individual with you.  Please call the Pre-admissions Testing Dept. at [phone] if you have any questions about these instructions.  Surgery Visitation Policy:  Patients having surgery or a procedure may have two visitors.  Children under the age of 8 must have an adult with them who is not the patient.  Inpatient Visitation:    Visiting hours are 7 a.m. to 8 p.m. Up to four visitors are allowed at one time in a patient room. The visitors may rotate out with other people during the day.  One visitor age 66 or older may stay with the patient overnight and must be in the room by 8 p.m.   Merchandiser, Retail to address health-related social needs:  [email or web address]                                                                                                             Preparing for Surgery with CHLORHEXIDINE  GLUCONATE (CHG) Soap  Chlorhexidine  Gluconate (CHG) Soap  o An antiseptic cleaner that kills germs and bonds with the skin to continue killing germs even after washing  o Used for showering the night before surgery and morning of surgery  Before surgery, you can play an important role by reducing the number of germs on your skin.  CHG (Chlorhexidine  gluconate) soap is an antiseptic cleanser which kills germs and bonds with the skin to continue killing germs even after washing.  Please do not use if you have an allergy to CHG or antibacterial soaps. If your skin becomes reddened/irritated stop using the CHG.  1. Shower the NIGHT BEFORE SURGERY with CHG soap.  2. If you choose to wash your hair, wash your hair first as usual with your normal shampoo.  3. After  shampooing, rinse your hair and body thoroughly to remove the shampoo.  4. Use CHG as you would any other liquid soap. You can apply CHG directly to the skin and wash gently with a clean washcloth.  5. Apply the CHG soap to your body only from the neck down. Do not use on open wounds or open sores. Avoid contact with your eyes, ears, mouth, and genitals (private parts). Wash face and genitals (private parts) with your normal soap.  6. Wash thoroughly, paying special attention to the area where  your surgery will be performed.  7. Thoroughly rinse your body with warm water .  8. Do not shower/wash with your normal soap after using and rinsing off the CHG soap.  9. Do not use lotions, oils, etc., after showering with CHG.  10. Pat yourself dry with a clean towel.  11. Wear clean pajamas to bed the night before surgery.  12. Place clean sheets on your bed the night of your shower and do not sleep with pets.  13. Do not apply any deodorants/lotions/powders.  14. Please wear clean clothes to the hospital.  15. Remember to brush your teeth with your regular toothpaste.

## 2024-09-09 NOTE — Pre-Procedure Instructions (Signed)
No answer. VM left.  

## 2024-09-09 NOTE — Telephone Encounter (Signed)
 Not sure why the results were sent to preop if requesting office inquiring if pt has been cleared. I will review the chart for preop clearance.             Tag   Copy Marlo Kand      Telephone Encounter Signed   Creation Time: 09/09/2024 10:58 AM       Signed     [facility]  calling for update. Please advise.

## 2024-09-09 NOTE — Telephone Encounter (Signed)
 Bernardino Bring is on vacation. To address the question:  Regarding ASA therapy, we recommend continuation of ASA throughout the perioperative period.  However, if the surgeon feels that cessation of ASA is required in the perioperative period, it may be stopped 5-7 days prior to surgery with a plan to resume it as soon as felt to be feasible from a surgical standpoint in the post-operative period.  I will send this note to Dr. Marinda as well.

## 2024-09-09 NOTE — Telephone Encounter (Signed)
 Not sure why the results were sent to preop if requesting office inquiring if pt has been cleared. I will review the chart for preop clearance.

## 2024-09-10 NOTE — Progress Notes (Signed)
 Cardiac clearance in Epic from [employer] PA on 08-16-24-low risk

## 2024-09-10 NOTE — Progress Notes (Deleted)
 EKG showing a flutter with hr of 138. Pt denies being sob, dizzy. Bp 123/99. Metoprolol was taken last night as pt takes every night. Called ED charge nurse to inform her of this and that we will be bringing pt to ED

## 2024-09-15 MED ORDER — CHLORHEXIDINE GLUCONATE 0.12 % MT SOLN
15.0000 mL | Freq: Once | OROMUCOSAL | Status: AC
  Administered 2024-09-16: 15 mL via OROMUCOSAL

## 2024-09-15 MED ORDER — CEFAZOLIN SODIUM-DEXTROSE 2-4 GM/100ML-% IV SOLN
2.0000 g | INTRAVENOUS | Status: AC
  Administered 2024-09-16: 2 g via INTRAVENOUS

## 2024-09-15 MED ORDER — CHLORHEXIDINE GLUCONATE CLOTH 2 % EX PADS: 6.0000 | MEDICATED_PAD | Freq: Once | CUTANEOUS | Status: DC

## 2024-09-15 MED ORDER — ORAL CARE MOUTH RINSE: 15.0000 mL | Freq: Once | OROMUCOSAL | Status: AC

## 2024-09-15 MED ORDER — SODIUM CHLORIDE 0.9 % IV SOLN: INTRAVENOUS | Status: DC

## 2024-09-16 ENCOUNTER — Ambulatory Visit
Admission: RE | Admit: 2024-09-16 | Discharge: 2024-09-16 | Disposition: A | Discharge status: Home / Self Care | Attending: General Surgery | Admitting: General Surgery

## 2024-09-16 ENCOUNTER — Encounter: Payer: Self-pay | Admitting: General Surgery

## 2024-09-16 ENCOUNTER — Ambulatory Visit

## 2024-09-16 ENCOUNTER — Ambulatory Visit: Payer: Self-pay | Admitting: Urgent Care

## 2024-09-16 ENCOUNTER — Other Ambulatory Visit: Payer: Self-pay

## 2024-09-16 ENCOUNTER — Encounter
Admission: RE | Disposition: A | Discharge status: Home / Self Care | Payer: Self-pay | Source: Home / Self Care | Attending: General Surgery

## 2024-09-16 DIAGNOSIS — I10 Essential (primary) hypertension: Secondary | ICD-10-CM | POA: Insufficient documentation

## 2024-09-16 DIAGNOSIS — E119 Type 2 diabetes mellitus without complications: Secondary | ICD-10-CM

## 2024-09-16 DIAGNOSIS — N189 Chronic kidney disease, unspecified: Secondary | ICD-10-CM | POA: Diagnosis not present

## 2024-09-16 DIAGNOSIS — J45909 Unspecified asthma, uncomplicated: Secondary | ICD-10-CM | POA: Diagnosis not present

## 2024-09-16 DIAGNOSIS — R519 Headache, unspecified: Secondary | ICD-10-CM | POA: Diagnosis not present

## 2024-09-16 DIAGNOSIS — D631 Anemia in chronic kidney disease: Secondary | ICD-10-CM | POA: Insufficient documentation

## 2024-09-16 DIAGNOSIS — Z87891 Personal history of nicotine dependence: Secondary | ICD-10-CM | POA: Insufficient documentation

## 2024-09-16 DIAGNOSIS — E0821 Diabetes mellitus due to underlying condition with diabetic nephropathy: Secondary | ICD-10-CM

## 2024-09-16 DIAGNOSIS — K219 Gastro-esophageal reflux disease without esophagitis: Secondary | ICD-10-CM | POA: Insufficient documentation

## 2024-09-16 DIAGNOSIS — E1122 Type 2 diabetes mellitus with diabetic chronic kidney disease: Secondary | ICD-10-CM | POA: Diagnosis not present

## 2024-09-16 DIAGNOSIS — K279 Peptic ulcer, site unspecified, unspecified as acute or chronic, without hemorrhage or perforation: Secondary | ICD-10-CM | POA: Insufficient documentation

## 2024-09-16 DIAGNOSIS — I129 Hypertensive chronic kidney disease with stage 1 through stage 4 chronic kidney disease, or unspecified chronic kidney disease: Secondary | ICD-10-CM | POA: Insufficient documentation

## 2024-09-16 DIAGNOSIS — K429 Umbilical hernia without obstruction or gangrene: Secondary | ICD-10-CM | POA: Diagnosis not present

## 2024-09-16 DIAGNOSIS — Z833 Family history of diabetes mellitus: Secondary | ICD-10-CM | POA: Diagnosis not present

## 2024-09-16 HISTORY — PX: UMBILICAL HERNIA REPAIR: SHX196

## 2024-09-16 LAB — GLUCOSE, CAPILLARY
Glucose-Capillary: 131 mg/dL — ABNORMAL HIGH (ref 70–99)
Glucose-Capillary: 134 mg/dL — ABNORMAL HIGH (ref 70–99)

## 2024-09-16 SURGERY — REPAIR, HERNIA, UMBILICAL, ADULT
Anesthesia: General | Site: Abdomen

## 2024-09-16 MED ORDER — CHLORHEXIDINE GLUCONATE 0.12 % MT SOLN
OROMUCOSAL | Status: AC
  Filled 2024-09-16: qty 15

## 2024-09-16 MED ORDER — LIDOCAINE HCL (PF) 2 % IJ SOLN
INTRAMUSCULAR | Status: AC
  Filled 2024-09-16: qty 5

## 2024-09-16 MED ORDER — FENTANYL CITRATE (PF) 100 MCG/2ML IJ SOLN
INTRAMUSCULAR | Status: AC
  Filled 2024-09-16: qty 2

## 2024-09-16 MED ORDER — PROPOFOL 1000 MG/100ML IV EMUL
INTRAVENOUS | Status: AC
  Filled 2024-09-16: qty 100

## 2024-09-16 MED ORDER — LIDOCAINE HCL (CARDIAC) PF 100 MG/5ML IV SOSY
PREFILLED_SYRINGE | INTRAVENOUS | Status: DC | PRN
  Administered 2024-09-16: 100 mg via INTRAVENOUS

## 2024-09-16 MED ORDER — 0.9 % SODIUM CHLORIDE (POUR BTL) OPTIME
TOPICAL | Status: DC | PRN
  Administered 2024-09-16: 500 mL

## 2024-09-16 MED ORDER — FENTANYL CITRATE (PF) 100 MCG/2ML IJ SOLN: 25.0000 ug | INTRAMUSCULAR | Status: DC | PRN

## 2024-09-16 MED ORDER — BUPIVACAINE-EPINEPHRINE (PF) 0.5% -1:200000 IJ SOLN
INTRAMUSCULAR | Status: DC | PRN
  Administered 2024-09-16: 10 mL via PERINEURAL
  Administered 2024-09-16: 20 mL via PERINEURAL

## 2024-09-16 MED ORDER — BUPIVACAINE-EPINEPHRINE (PF) 0.5% -1:200000 IJ SOLN
INTRAMUSCULAR | Status: AC
  Filled 2024-09-16: qty 30

## 2024-09-16 MED ORDER — KETOROLAC TROMETHAMINE 30 MG/ML IJ SOLN
INTRAMUSCULAR | Status: DC | PRN
  Administered 2024-09-16: 30 mg via INTRAVENOUS

## 2024-09-16 MED ORDER — ACETAMINOPHEN 10 MG/ML IV SOLN
INTRAVENOUS | Status: DC | PRN
  Administered 2024-09-16: 1000 mg via INTRAVENOUS

## 2024-09-16 MED ORDER — ONDANSETRON HCL 4 MG/2ML IJ SOLN
INTRAMUSCULAR | Status: AC
  Filled 2024-09-16: qty 2

## 2024-09-16 MED ORDER — ACETAMINOPHEN 10 MG/ML IV SOLN
INTRAVENOUS | Status: AC
  Filled 2024-09-16: qty 100

## 2024-09-16 MED ORDER — ROCURONIUM BROMIDE 100 MG/10ML IV SOLN
INTRAVENOUS | Status: DC | PRN
  Administered 2024-09-16: 50 mg via INTRAVENOUS

## 2024-09-16 MED ORDER — DEXAMETHASONE SOD PHOSPHATE PF 10 MG/ML IJ SOLN
INTRAMUSCULAR | Status: DC | PRN
  Administered 2024-09-16: 10 mg via INTRAVENOUS

## 2024-09-16 MED ORDER — ONDANSETRON HCL 4 MG/2ML IJ SOLN
INTRAMUSCULAR | Status: DC | PRN
  Administered 2024-09-16: 4 mg via INTRAVENOUS

## 2024-09-16 MED ORDER — FENTANYL CITRATE (PF) 100 MCG/2ML IJ SOLN
INTRAMUSCULAR | Status: DC | PRN
  Administered 2024-09-16 (×2): 50 ug via INTRAVENOUS

## 2024-09-16 MED ORDER — OXYCODONE HCL 5 MG PO TABS: 5.0000 mg | ORAL_TABLET | Freq: Once | ORAL | Status: DC | PRN

## 2024-09-16 MED ORDER — CEFAZOLIN SODIUM-DEXTROSE 2-4 GM/100ML-% IV SOLN
INTRAVENOUS | Status: AC
  Filled 2024-09-16: qty 100

## 2024-09-16 MED ORDER — PROPOFOL 10 MG/ML IV BOLUS
INTRAVENOUS | Status: DC | PRN
  Administered 2024-09-16: 150 ug/kg/min via INTRAVENOUS
  Administered 2024-09-16: 125 ug/kg/min via INTRAVENOUS
  Administered 2024-09-16: 150 ug/kg/min via INTRAVENOUS

## 2024-09-16 MED ORDER — SUGAMMADEX SODIUM 200 MG/2ML IV SOLN
INTRAVENOUS | Status: DC | PRN
  Administered 2024-09-16: 200 mg via INTRAVENOUS

## 2024-09-16 MED ORDER — OXYCODONE HCL 5 MG/5ML PO SOLN: 5.0000 mg | Freq: Once | ORAL | Status: DC | PRN

## 2024-09-16 MED ORDER — MIDAZOLAM HCL 2 MG/2ML IJ SOLN
INTRAMUSCULAR | Status: AC
  Filled 2024-09-16: qty 2

## 2024-09-16 MED ORDER — ACETAMINOPHEN 10 MG/ML IV SOLN: 1000.0000 mg | Freq: Once | INTRAVENOUS | Status: DC | PRN

## 2024-09-16 MED ORDER — ROCURONIUM BROMIDE 10 MG/ML (PF) SYRINGE
PREFILLED_SYRINGE | INTRAVENOUS | Status: AC
  Filled 2024-09-16: qty 10

## 2024-09-16 MED ORDER — DROPERIDOL 2.5 MG/ML IJ SOLN: 0.6250 mg | Freq: Once | INTRAMUSCULAR | Status: DC | PRN

## 2024-09-16 MED ORDER — OXYCODONE HCL 5 MG PO TABS: 5.0000 mg | ORAL_TABLET | Freq: Four times a day (QID) | ORAL | 0 refills | Status: DC | PRN

## 2024-09-16 MED ORDER — PHENYLEPHRINE 80 MCG/ML (10ML) SYRINGE FOR IV PUSH (FOR BLOOD PRESSURE SUPPORT)
PREFILLED_SYRINGE | INTRAVENOUS | Status: AC
  Filled 2024-09-16: qty 10

## 2024-09-16 MED ORDER — MIDAZOLAM HCL (PF) 2 MG/2ML IJ SOLN
INTRAMUSCULAR | Status: DC | PRN
  Administered 2024-09-16: 2 mg via INTRAVENOUS

## 2024-09-16 MED ORDER — PHENYLEPHRINE 80 MCG/ML (10ML) SYRINGE FOR IV PUSH (FOR BLOOD PRESSURE SUPPORT)
PREFILLED_SYRINGE | INTRAVENOUS | Status: DC | PRN
  Administered 2024-09-16 (×3): 80 ug via INTRAVENOUS

## 2024-09-16 SURGICAL SUPPLY — 28 items
BLADE SURG 15 STRL LF DISP TIS (BLADE) ×1 IMPLANT
BLADE SURG SZ10 CARB STEEL (BLADE) IMPLANT
CHLORAPREP W/TINT 26 (MISCELLANEOUS) IMPLANT
DERMABOND ADVANCED .7 DNX12 (GAUZE/BANDAGES/DRESSINGS) IMPLANT
DRAPE LAPAROTOMY 100X77 ABD (DRAPES) ×1 IMPLANT
DRSG OPSITE POSTOP 4X10 (GAUZE/BANDAGES/DRESSINGS) IMPLANT
DRSG OPSITE POSTOP 4X8 (GAUZE/BANDAGES/DRESSINGS) IMPLANT
ELECT BLADE 6.5 EXT (BLADE) IMPLANT
ELECTRODE REM PT RTRN 9FT ADLT (ELECTROSURGICAL) ×1 IMPLANT
GLOVE BIOGEL PI IND STRL 7.5 (GLOVE) ×1 IMPLANT
GLOVE SURG SYN 7.0 PF PI (GLOVE) ×1 IMPLANT
GOWN STRL REUS W/ TWL LRG LVL3 (GOWN DISPOSABLE) ×2 IMPLANT
KIT TURNOVER KIT A (KITS) ×1 IMPLANT
LABEL OR SOLS (LABEL) ×1 IMPLANT
MANIFOLD NEPTUNE II (INSTRUMENTS) ×1 IMPLANT
NEEDLE HYPO 22X1.5 SAFETY MO (MISCELLANEOUS) ×1 IMPLANT
NS IRRIG 500ML POUR BTL (IV SOLUTION) ×1 IMPLANT
PACK BASIN MINOR ARMC (MISCELLANEOUS) ×1 IMPLANT
SOLN STERILE WATER 500 ML (IV SOLUTION) ×1 IMPLANT
SPONGE T-LAP 18X18 ~~LOC~~+RFID (SPONGE) ×1 IMPLANT
STAPLER SKIN PROX 35W (STAPLE) IMPLANT
SUT ETHIBOND 0 MO6 C/R (SUTURE) ×1 IMPLANT
SUT PDS PLUS AB 0 CT-2 (SUTURE) IMPLANT
SUT VIC AB 3-0 SH 27X BRD (SUTURE) ×1 IMPLANT
SUTURE MNCRL 4-0 27XMF (SUTURE) ×1 IMPLANT
SYR 10ML LL (SYRINGE) ×1 IMPLANT
SYR 20ML LL LF (SYRINGE) IMPLANT
TRAP FLUID SMOKE EVACUATOR (MISCELLANEOUS) ×1 IMPLANT

## 2024-09-16 NOTE — Addendum Note (Signed)
 Addendum  created 09/16/24 1000 by Heavenly Christine R, CRNA   Flowsheet accepted

## 2024-09-16 NOTE — Anesthesia Preprocedure Evaluation (Addendum)
 "                                  Anesthesia Evaluation  Patient identified by MRN, date of birth, ID band Patient awake    Reviewed: Allergy & Precautions, H&P , NPO status , Patient's Chart, lab work & pertinent test results, reviewed documented beta blocker date and time   Airway Mallampati: II  TM Distance: >3 FB Neck ROM: full    Dental  (+) Teeth Intact   Pulmonary neg shortness of breath, asthma , former smoker   Pulmonary exam normal        Cardiovascular Exercise Tolerance: Poor hypertension, Normal cardiovascular exam+ dysrhythmias  Rhythm:regular Rate:Normal     Neuro/Psych  Headaches  Neuromuscular disease  negative psych ROS   GI/Hepatic Neg liver ROS, PUD,GERD  Medicated,,  Endo/Other  negative endocrine ROSdiabetes    Renal/GU Renal disease  negative genitourinary   Musculoskeletal   Abdominal   Peds  Hematology  (+) Blood dyscrasia, anemia   Anesthesia Other Findings Past Medical History: No date: Arthritis No date: Asthma No date: Chronic kidney disease No date: Diabetes mellitus without complication (HCC)     Comment:  type 2 No date: DJD (degenerative joint disease) No date: Dysrhythmia     Comment:  junctional tachycardia and incomplete heart block 07/04/2022: Edema of left lower extremity No date: Gout No date: Heart block No date: Lung nodules     Comment:  a. 09/2021 CT chest: Interval decrease in size and number              of bilateral lung nodules, likely consistent with               sequelae associated with an infectious/inflammatory               process. 06/2021: MSSA bacteremia 08/26/2021: Rotator cuff tear No date: Subacute bacterial endocarditis (SBE)     Comment:  a. 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 Past Surgical History: No date: ACHILLES TENDON REPAIR; Left     Comment:  many years ago per pt 10/06/2022: ANTERIOR HIP REVISION; Left     Comment:  Procedure: REMOVAL OF LEFT HIP ANTIBIOTIC SPACER, LEFT               TOTAL HIP REVISION ARTHROPLASTY;  Surgeon: Vernetta Lonni GRADE, MD;  Location: [facility];  Service:               Orthopedics;  Laterality: Left; 07/16/2021: BIOPSY     Comment:  Procedure: BIOPSY;  Surgeon: Federico Rosario BROCKS, MD;                Location: [facility] ENDOSCOPY;  Service: Gastroenterology;;  EGD              and COLON No date: CARDIAC VALVE REPLACEMENT 07/16/2021: COLONOSCOPY; N/A     Comment:  Procedure: COLONOSCOPY;  Surgeon: Federico Rosario BROCKS, MD;  Location: [facility] ENDOSCOPY;  Service: Gastroenterology;                Laterality: N/A; 07/16/2021: COLONOSCOPY WITH PROPOFOL ; N/A     Comment:  Procedure: COLONOSCOPY WITH PROPOFOL ;  Surgeon: Federico Rosario BROCKS, MD;  Location: [facility] ENDOSCOPY;  Service:               Gastroenterology;  Laterality: N/A; 07/16/2021: ESOPHAGOGASTRODUODENOSCOPY (EGD) WITH PROPOFOL ; N/A     Comment:  Procedure: ESOPHAGOGASTRODUODENOSCOPY (EGD) WITH               PROPOFOL ;  Surgeon: Federico Rosario BROCKS, MD;  Location: [facility]               ENDOSCOPY;  Service: Gastroenterology;  Laterality: N/A; 05/20/2022: EXCISIONAL TOTAL HIP ARTHROPLASTY WITH ANTIBIOTIC  SPACERS; Left     Comment:  Procedure: EXCISIONAL LEFT TOTAL HIP ARTHROPLASTY WITH               ANTIBIOTIC SPACERS;  Surgeon: Vernetta Lonni GRADE,               MD;  Location: [facility];  Service: Orthopedics;                Laterality: Left; No date: FOOT SURGERY; Right     Comment:  ligaments repaired- many years ago per pt No date: HERNIA REPAIR No date: JOINT REPLACEMENT; Bilateral     Comment:  hip 07/16/2021: POLYPECTOMY     Comment:  Procedure: POLYPECTOMY;  Surgeon: Federico Rosario BROCKS, MD;                Location: [facility] ENDOSCOPY;  Service:  Gastroenterology;; 02/10/2022: SHOULDER ARTHROSCOPY WITH ROTATOR CUFF REPAIR AND  SUBACROMIAL DECOMPRESSION; Left     Comment:  Procedure: LEFT SHOULDER ARTHROSCOPY WITH EXTENSIVE               DEBRIDEMENT, SUBACROMIAL DECOMPRESSION;  Surgeon:               Vernetta Lonni GRADE, MD;  Location: [facility] OR;  Service:               Orthopedics;  Laterality: Left; 07/07/2021: TEE WITHOUT CARDIOVERSION; N/A     Comment:  Procedure: TRANSESOPHAGEAL ECHOCARDIOGRAM (TEE);                Surgeon: Darliss Rogue, MD;  Location: [facility] ORS;                Service: Cardiovascular;  Laterality: N/A; BMI    Body Mass Index: 36.59 kg/m     Reproductive/Obstetrics negative OB ROS                              Anesthesia Physical Anesthesia Plan  ASA: 3  Anesthesia Plan: General ETT   Post-op Pain Management:    Induction:   PONV Risk Score and Plan: 3  Airway Management Planned:   Additional Equipment:   Intra-op Plan:   Post-operative Plan:   Informed Consent: I have reviewed the patients History and Physical, chart, labs and discussed the procedure including the risks, benefits and alternatives for the proposed anesthesia with the patient or authorized representative who has indicated his/her understanding and acceptance.     Dental Advisory Given  Plan Discussed with: CRNA  Anesthesia Plan Comments:         Anesthesia Quick Evaluation  "

## 2024-09-16 NOTE — Transfer of Care (Signed)
 Immediate Anesthesia Transfer of Care Note  Patient: Joshua Cole  Procedure(s) Performed: REPAIR, HERNIA, UMBILICAL, ADULT (Abdomen)  Patient Location: PACU  Anesthesia Type:General  Level of Consciousness: awake  Airway & Oxygen Therapy: Patient Spontanous Breathing and Patient connected to face mask oxygen  Post-op Assessment: Report given to RN and Post -op Vital signs reviewed and stable  Post vital signs: Reviewed and stable  Last Vitals:  Vitals Value Taken Time  BP 95/69 09/16/24 08:45  Temp    Pulse 73 09/16/24 08:46  Resp 16 09/16/24 08:46  SpO2 100 % 09/16/24 08:46  Vitals shown include unfiled device data.  Last Pain:  Vitals:   09/16/24 0619  TempSrc: Tympanic  PainSc: 7          Complications: There were no known notable events for this encounter.

## 2024-09-16 NOTE — Anesthesia Postprocedure Evaluation (Signed)
"   Anesthesia Post Note  Patient: Joshua Cole  Procedure(s) Performed: REPAIR, HERNIA, UMBILICAL, ADULT (Abdomen)  Patient location during evaluation: PACU Anesthesia Type: General Level of consciousness: awake and alert Pain management: pain level controlled Vital Signs Assessment: post-procedure vital signs reviewed and stable Respiratory status: spontaneous breathing, nonlabored ventilation, respiratory function stable and patient connected to nasal cannula oxygen Cardiovascular status: blood pressure returned to baseline and stable Postop Assessment: no apparent nausea or vomiting Anesthetic complications: no   There were no known notable events for this encounter.   Last Vitals:  Vitals:   09/16/24 0619 09/16/24 0845  BP: (!) 142/77 95/69  Pulse: 85 74  Resp: 18 14  Temp: (!) 36.2 C (!) 36.3 C  SpO2: 97% 96%    Last Pain:  Vitals:   09/16/24 0619  TempSrc: Tympanic  PainSc: 7                  Lynwood KANDICE Clause      "

## 2024-09-16 NOTE — Op Note (Signed)
 Operative Note  Preoperative diagnosis: Umbilical hernia Postoperative diagnosis: Umbilical hernia measuring approximately 1-1/2 cm Surgeon: [name] EBL: 5 cc Procedure: Repair of reducible umbilical hernia  After informed consent was obtained the patient was brought to the operating room placed supine on the operating room table.  General endotracheal anesthesia was then induced and his abdomen was then prepped and draped in the usual sterile fashion.  A surgical timeout was called identifying correct patient, site, side and procedure.  A curvilinear infraumbilical incision was made and taken down through the subcutaneous tissue with Bovie cautery.  This was done after infiltration of 20 cc of half percent Marcaine  with epinephrine .  The umbilical stalk was then circumferentially dissected and separated from the hernia sac.  The hernia sac was then opened and this revealed that there was fat within the hernia.  The hernia sac was cleared from the fascial edges to allow for good fascial purchase with the repair.  The fat was reduced into the abdominal cavity.  The fascial defect measured approximately 1-1/2 cm.  It was closed with 0 Ethibond in an interrupted fashion.  The  umbilicus was then tacked down to the fascia with a 3-0 Vicryl.  The subcutaneous tissue was closed with 3-0 Vicryl and the skin was closed with 4-0 Monocryl and dressed with glue.  The patient was awoken from general endotracheal anesthesia and transferred to the PACU in good condition.

## 2024-09-16 NOTE — H&P (Signed)
 No changes to below H and P, patient has undergone cardiac risk stratification. Proceed with open umbilical hernia repair, possible use of mesh  Joshua Cole is an 64 y.o. male.        Chief Complaint  Patient presents with   Follow-up      Umbilical hernia      HPI: The patient returns today to discuss his umbilical hernia.  He saw me back in the summer but at that time it was not a good time for him surgery.  He reports that he has continued to do well.  He will intermittently have pain at his umbilicus.  He denies any obstructive symptoms or drainage from the area.  He denies any overlying skin changes.  He continues to take aspirin .  He has a appointment with his cardiologist on 12 December       Past Medical History:  Diagnosis Date   Arthritis     Asthma     Chronic kidney disease     Diabetes mellitus without complication (HCC)      type 2   DJD (degenerative joint disease)     Dysrhythmia      junctional tachycardia and incomplete heart block   Edema of left lower extremity 07/04/2022   Gout     Heart block     Lung nodules      a. 09/2021 CT chest: Interval decrease in size and number of bilateral lung nodules, likely consistent with sequelae associated with an infectious/inflammatory process.   MSSA bacteremia 06/2021   Rotator cuff tear 08/26/2021   Subacute bacterial endocarditis (SBE)      a. 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               Past Surgical History:  Procedure Laterality Date   ACHILLES TENDON REPAIR Left      many years ago per pt   ANTERIOR HIP REVISION Left 10/06/2022    Procedure: REMOVAL OF LEFT HIP ANTIBIOTIC SPACER, LEFT TOTAL HIP REVISION ARTHROPLASTY;  Surgeon: Vernetta Lonni GRADE, MD;  Location: [facility];  Service: Orthopedics;  Laterality: Left;   BIOPSY    07/16/2021    Procedure: BIOPSY;  Surgeon: Federico Rosario BROCKS, MD;  Location: [facility] ENDOSCOPY;  Service: Gastroenterology;;  EGD and COLON   COLONOSCOPY N/A 07/16/2021    Procedure: COLONOSCOPY;  Surgeon: Federico Rosario BROCKS, MD;  Location: [facility] ENDOSCOPY;  Service: Gastroenterology;  Laterality: N/A;   COLONOSCOPY WITH PROPOFOL  N/A 07/16/2021    Procedure: COLONOSCOPY WITH PROPOFOL ;  Surgeon: Federico Rosario BROCKS, MD;  Location: [facility] ENDOSCOPY;  Service: Gastroenterology;  Laterality: N/A;   ESOPHAGOGASTRODUODENOSCOPY (EGD) WITH PROPOFOL  N/A 07/16/2021    Procedure: ESOPHAGOGASTRODUODENOSCOPY (EGD) WITH PROPOFOL ;  Surgeon: Federico Rosario BROCKS, MD;  Location: [facility] ENDOSCOPY;  Service: Gastroenterology;  Laterality: N/A;   EXCISIONAL TOTAL HIP ARTHROPLASTY WITH ANTIBIOTIC SPACERS Left 05/20/2022    Procedure: EXCISIONAL LEFT TOTAL HIP ARTHROPLASTY WITH ANTIBIOTIC SPACERS;  Surgeon: Vernetta Lonni GRADE, MD;  Location: [facility];  Service: Orthopedics;  Laterality: Left;   FOOT SURGERY Right      ligaments repaired- many years ago per pt   HERNIA REPAIR       JOINT REPLACEMENT Bilateral      hip   POLYPECTOMY   07/16/2021    Procedure: POLYPECTOMY;  Surgeon: Federico Rosario BROCKS, MD;  Location: [facility] ENDOSCOPY;  Service: Gastroenterology;;   SHOULDER ARTHROSCOPY WITH ROTATOR CUFF REPAIR AND SUBACROMIAL DECOMPRESSION Left 02/10/2022    Procedure: LEFT SHOULDER ARTHROSCOPY WITH EXTENSIVE DEBRIDEMENT, SUBACROMIAL DECOMPRESSION;  Surgeon: Vernetta Lonni GRADE, MD;  Location: [facility] OR;  Service: Orthopedics;  Laterality: Left;   TEE WITHOUT CARDIOVERSION N/A 07/07/2021    Procedure: TRANSESOPHAGEAL ECHOCARDIOGRAM (TEE);  Surgeon: Darliss Rogue, MD;  Location: [facility] ORS;  Service: Cardiovascular;  Laterality: N/A;               Family History  Problem Relation Age of Onset   Other Mother          unknown medical history   Lung cancer Father     Psoriasis Sister     Breast cancer Sister     Hypertension Brother      Hyperlipidemia Brother     Diabetes Brother     Heart attack Brother 4          Social History:  reports that he quit smoking about 18 years ago. His smoking use included cigarettes. He started smoking about 33 years ago. He has a 3.8 pack-year smoking history. He has never used smokeless tobacco. He reports that he does not currently use alcohol  after a past usage of about 20.0 standard drinks of alcohol  per week. He reports that he does not currently use drugs.   Allergies:  Allergies  No Known Allergies     Medications reviewed.       ROS Full ROS performed and is otherwise negative other than what is stated in HPI     BP (!) 153/81   Pulse 91   Temp 98.2 F (36.8 C) (Oral)   Ht 5' 10 (1.778 m)   Wt 255 lb 12.8 oz (116 kg)   SpO2 96%   BMI 36.70 kg/m    Physical Exam Abdomen soft, protuberant, nonreducible umbilical hernia containing fat with mild discoloration over it and some pain with attempt at reduction.       Lab Results Last 48 Hours  No results found for this or any previous visit (from the past 48 hours).   Imaging Results (Last 48 hours)  No results found.     Assessment/Plan:   Patient with umbilical hernia.  He is not ready to have it repaired.  I discussed with him that we will do this open plus or minus the use of mesh.  He will have cardiac restratification done when he sees his cardiologist in several weeks.  I discussed the risk, benefits alternatives of the procedure including risk of infection, bleeding damage to underlying structures and recurrence of the hernia.  He understands these risk and wishes to proceed with surgery.  If possible we will hold his aspirin  for 6 days prior to the procedure but if not able to okay to continue in the perioperative period     Joshua Cole, M.D. [facility]

## 2024-09-16 NOTE — Anesthesia Procedure Notes (Signed)
 Procedure Name: Intubation Date/Time: 09/16/2024 7:47 AM  Performed by: Bonnetta Jimmey SAUNDERS, CRNAPre-anesthesia Checklist: Patient identified, Emergency Drugs available, Suction available and Patient being monitored Patient Re-evaluated:Patient Re-evaluated prior to induction Oxygen Delivery Method: Circle system utilized Preoxygenation: Pre-oxygenation with 100% oxygen Induction Type: IV induction Ventilation: Two handed mask ventilation required Laryngoscope Size: McGrath and 4 Grade View: Grade I Tube type: Oral Tube size: 7.0 mm Number of attempts: 1 Airway Equipment and Method: Stylet and Oral airway Placement Confirmation: ETT inserted through vocal cords under direct vision, positive ETCO2 and breath sounds checked- equal and bilateral Secured at: 22 cm Tube secured with: Tape Dental Injury: Teeth and Oropharynx as per pre-operative assessment

## 2024-09-17 ENCOUNTER — Encounter: Payer: Self-pay | Admitting: General Surgery

## 2024-10-01 ENCOUNTER — Ambulatory Visit: Admitting: General Surgery

## 2024-10-01 ENCOUNTER — Encounter: Payer: Self-pay | Admitting: General Surgery

## 2024-10-01 VITALS — BP 123/78 | HR 89 | Temp 98.0°F | Ht 70.0 in | Wt 256.8 lb

## 2024-10-01 DIAGNOSIS — Z09 Encounter for follow-up examination after completed treatment for conditions other than malignant neoplasm: Secondary | ICD-10-CM

## 2024-10-01 DIAGNOSIS — K429 Umbilical hernia without obstruction or gangrene: Secondary | ICD-10-CM

## 2024-10-01 NOTE — Patient Instructions (Signed)

## 2024-10-01 NOTE — Progress Notes (Signed)
 Outpatient Surgical Follow Up  10/01/2024  Joshua Cole is an 64 y.o. male.   Chief Complaint  Patient presents with   Routine Post Op    Umbilical hernia repair 09/16/2024    HPI: The patient returns today status post ventral hernia repair.  He reports doing well.  He says he had a little bit of soreness and bruising throughout his abdomen.  He says that this is improved.  He denies any bulges or recurrence of the hernia.  He is tolerating a diet and having normal bowel function.  Past Medical History:  Diagnosis Date   Arthritis    Asthma    Chronic kidney disease    Diabetes mellitus without complication (HCC)    type 2   DJD (degenerative joint disease)    Dysrhythmia    junctional tachycardia and incomplete heart block   Edema of left lower extremity 07/04/2022   Gout    Heart block    Lung nodules    a. 09/2021 CT chest: Interval decrease in size and number of bilateral lung nodules, likely consistent with sequelae associated with an infectious/inflammatory process.   MSSA bacteremia 06/2021   Rotator cuff tear 08/26/2021   Subacute bacterial endocarditis (SBE)    a. 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    Past Surgical History:  Procedure Laterality Date   ACHILLES TENDON REPAIR Left    many years ago per pt   ANTERIOR HIP REVISION Left 10/06/2022   Procedure: REMOVAL OF LEFT HIP ANTIBIOTIC SPACER, LEFT TOTAL HIP REVISION ARTHROPLASTY;  Surgeon: Vernetta Lonni GRADE, MD;  Location: [facility];  Service: Orthopedics;  Laterality: Left;   BIOPSY  07/16/2021   Procedure: BIOPSY;  Surgeon: Federico Rosario BROCKS, MD;  Location: [facility] ENDOSCOPY;  Service: Gastroenterology;;  EGD and COLON   CARDIAC VALVE REPLACEMENT     COLONOSCOPY N/A 07/16/2021   Procedure: COLONOSCOPY;  Surgeon: Federico Rosario BROCKS, MD;   Location: [facility] ENDOSCOPY;  Service: Gastroenterology;  Laterality: N/A;   COLONOSCOPY WITH PROPOFOL  N/A 07/16/2021   Procedure: COLONOSCOPY WITH PROPOFOL ;  Surgeon: Federico Rosario BROCKS, MD;  Location: [facility] ENDOSCOPY;  Service: Gastroenterology;  Laterality: N/A;   ESOPHAGOGASTRODUODENOSCOPY (EGD) WITH PROPOFOL  N/A 07/16/2021   Procedure: ESOPHAGOGASTRODUODENOSCOPY (EGD) WITH PROPOFOL ;  Surgeon: Federico Rosario BROCKS, MD;  Location: [facility] ENDOSCOPY;  Service: Gastroenterology;  Laterality: N/A;   EXCISIONAL TOTAL HIP ARTHROPLASTY WITH ANTIBIOTIC SPACERS Left 05/20/2022   Procedure: EXCISIONAL LEFT TOTAL HIP ARTHROPLASTY WITH ANTIBIOTIC SPACERS;  Surgeon: Vernetta Lonni GRADE, MD;  Location: [facility];  Service: Orthopedics;  Laterality: Left;   FOOT SURGERY Right    ligaments repaired- many years ago per pt   HERNIA REPAIR     JOINT REPLACEMENT Bilateral    hip   POLYPECTOMY  07/16/2021   Procedure: POLYPECTOMY;  Surgeon: Federico Rosario BROCKS, MD;  Location: [facility] ENDOSCOPY;  Service: Gastroenterology;;   SHOULDER ARTHROSCOPY WITH ROTATOR CUFF REPAIR AND SUBACROMIAL DECOMPRESSION Left 02/10/2022   Procedure: LEFT SHOULDER ARTHROSCOPY WITH EXTENSIVE DEBRIDEMENT, SUBACROMIAL DECOMPRESSION;  Surgeon: Vernetta Lonni GRADE, MD;  Location: [facility] OR;  Service: Orthopedics;  Laterality: Left;   TEE WITHOUT CARDIOVERSION N/A 07/07/2021   Procedure: TRANSESOPHAGEAL ECHOCARDIOGRAM (TEE);  Surgeon: Darliss Rogue, MD;  Location: [facility] ORS;  Service: Cardiovascular;  Laterality: N/A;   UMBILICAL HERNIA REPAIR N/A 09/16/2024   Procedure: REPAIR, HERNIA, UMBILICAL, ADULT;  Surgeon: Marinda Jayson KIDD, MD;  Location: [facility] ORS;  Service: General;  Laterality: N/A;  open repair    Family History  Problem Relation Age of Onset   Other Mother        unknown medical history   Lung cancer Father    Psoriasis Sister    Breast cancer Sister    Hypertension Brother    Hyperlipidemia Brother    Diabetes Brother    Heart attack Brother 97     Social History:  reports that he quit smoking about 19 years ago. His smoking use included cigarettes. He started smoking about 34 years ago. He has a 3.8 pack-year smoking history. He has never used smokeless tobacco. He reports that he does not currently use alcohol  after a past usage of about 2.0 standard drinks of alcohol  per week. He reports that he does not currently use drugs.  Allergies: Allergies[1]  Medications reviewed.    ROS Full ROS performed and is otherwise negative other than what is stated in HPI   BP 123/78   Pulse 89   Temp 98 F (36.7 C) (Oral)   Ht 5' 10 (1.778 m)   Wt 256 lb 12.8 oz (116.5 kg)   SpO2 97%   BMI 36.85 kg/m   Physical Exam Infraumbilical incision is healing in well.  There is some scabbing over the incision.  There is still surgical glue on the incision.  There is no bulge with Valsalva.  He does have some bruising throughout his abdomen but that is improving.  Minimal tenderness over the incision.    No results found for this or any previous visit (from the past 48 hours). No results found.  Assessment/Plan: Patient status post umbilical hernia repair.  He is doing well.  No signs of postoperative infection or recurrence.  Recommend he continue lifting restrictions for another 4 weeks and nothing heavier than 10 to 15 pounds.  He can continue to use Tylenol  and ibuprofen as needed for pain.  The surgical glue should peel off over the next 2 weeks.  He can now submerge the wounds in water .  He can follow-up with us  as needed  A total of 22 minutes was spent reviewing the patient's chart, performing history and physical and discussing treatment options with the patient  Jayson Marinda, M.D. [facility]     [1] No Known Allergies

## 2024-10-28 ENCOUNTER — Ambulatory Visit: Admitting: Physician Assistant
